# Patient Record
Sex: Male | Born: 1965
Health system: Southern US, Community
[De-identification: ages and names within clinical notes are randomized; demographics above are authoritative.]

## PROBLEM LIST (undated history)

## (undated) DIAGNOSIS — G43909 Migraine, unspecified, not intractable, without status migrainosus: Secondary | ICD-10-CM

## (undated) DIAGNOSIS — C801 Malignant (primary) neoplasm, unspecified: Secondary | ICD-10-CM

## (undated) DIAGNOSIS — K259 Gastric ulcer, unspecified as acute or chronic, without hemorrhage or perforation: Secondary | ICD-10-CM

## (undated) DIAGNOSIS — M545 Low back pain, unspecified: Secondary | ICD-10-CM

## (undated) DIAGNOSIS — F329 Major depressive disorder, single episode, unspecified: Secondary | ICD-10-CM

## (undated) DIAGNOSIS — I880 Nonspecific mesenteric lymphadenitis: Secondary | ICD-10-CM

## (undated) DIAGNOSIS — K219 Gastro-esophageal reflux disease without esophagitis: Secondary | ICD-10-CM

## (undated) DIAGNOSIS — I739 Peripheral vascular disease, unspecified: Secondary | ICD-10-CM

## (undated) DIAGNOSIS — IMO0002 Reserved for concepts with insufficient information to code with codable children: Secondary | ICD-10-CM

## (undated) DIAGNOSIS — B9681 Helicobacter pylori [H. pylori] as the cause of diseases classified elsewhere: Secondary | ICD-10-CM

## (undated) DIAGNOSIS — M549 Dorsalgia, unspecified: Secondary | ICD-10-CM

## (undated) DIAGNOSIS — M1990 Unspecified osteoarthritis, unspecified site: Secondary | ICD-10-CM

## (undated) DIAGNOSIS — G8929 Other chronic pain: Secondary | ICD-10-CM

## (undated) DIAGNOSIS — F32A Depression, unspecified: Secondary | ICD-10-CM

## (undated) DIAGNOSIS — I10 Essential (primary) hypertension: Secondary | ICD-10-CM

## (undated) DIAGNOSIS — K589 Irritable bowel syndrome without diarrhea: Secondary | ICD-10-CM

## (undated) DIAGNOSIS — I7090 Unspecified atherosclerosis: Secondary | ICD-10-CM

## (undated) DIAGNOSIS — J189 Pneumonia, unspecified organism: Secondary | ICD-10-CM

## (undated) DIAGNOSIS — E7800 Pure hypercholesterolemia, unspecified: Secondary | ICD-10-CM

## (undated) DIAGNOSIS — Z9289 Personal history of other medical treatment: Secondary | ICD-10-CM

## (undated) DIAGNOSIS — F419 Anxiety disorder, unspecified: Secondary | ICD-10-CM

## (undated) HISTORY — DX: Gastro-esophageal reflux disease without esophagitis: K21.9

## (undated) HISTORY — DX: Essential (primary) hypertension: I10

## (undated) HISTORY — PX: KNEE ARTHROSCOPY: SHX127

## (undated) HISTORY — DX: Reserved for concepts with insufficient information to code with codable children: IMO0002

## (undated) HISTORY — PX: BACK SURGERY: SHX140

---

## 1997-09-12 ENCOUNTER — Inpatient Hospital Stay (HOSPITAL_COMMUNITY): Admission: EM | Admit: 1997-09-12 | Discharge: 1997-09-13 | Payer: Self-pay | Admitting: Emergency Medicine

## 1997-09-14 ENCOUNTER — Emergency Department (HOSPITAL_COMMUNITY): Admission: EM | Admit: 1997-09-14 | Discharge: 1997-09-14 | Payer: Self-pay | Admitting: Emergency Medicine

## 1997-09-15 ENCOUNTER — Ambulatory Visit (HOSPITAL_COMMUNITY): Admission: RE | Admit: 1997-09-15 | Discharge: 1997-09-15 | Payer: Self-pay | Admitting: Cardiology

## 1997-10-22 ENCOUNTER — Ambulatory Visit (HOSPITAL_COMMUNITY): Admission: RE | Admit: 1997-10-22 | Discharge: 1997-10-22 | Payer: Self-pay | Admitting: Gastroenterology

## 1998-06-23 ENCOUNTER — Emergency Department (HOSPITAL_COMMUNITY): Admission: EM | Admit: 1998-06-23 | Discharge: 1998-06-23 | Payer: Self-pay | Admitting: Emergency Medicine

## 1998-06-23 ENCOUNTER — Encounter: Payer: Self-pay | Admitting: Emergency Medicine

## 2001-11-04 ENCOUNTER — Encounter: Payer: Self-pay | Admitting: Emergency Medicine

## 2001-11-04 ENCOUNTER — Emergency Department (HOSPITAL_COMMUNITY): Admission: EM | Admit: 2001-11-04 | Discharge: 2001-11-04 | Payer: Self-pay | Admitting: Plastic Surgery

## 2002-01-24 ENCOUNTER — Emergency Department (HOSPITAL_COMMUNITY): Admission: EM | Admit: 2002-01-24 | Discharge: 2002-01-24 | Payer: Self-pay | Admitting: Emergency Medicine

## 2002-07-03 ENCOUNTER — Emergency Department (HOSPITAL_COMMUNITY): Admission: EM | Admit: 2002-07-03 | Discharge: 2002-07-04 | Payer: Self-pay | Admitting: Emergency Medicine

## 2002-07-06 ENCOUNTER — Emergency Department (HOSPITAL_COMMUNITY): Admission: EM | Admit: 2002-07-06 | Discharge: 2002-07-06 | Payer: Self-pay | Admitting: Emergency Medicine

## 2002-07-23 ENCOUNTER — Encounter: Admission: RE | Admit: 2002-07-23 | Discharge: 2002-07-23 | Payer: Self-pay | Admitting: Family Medicine

## 2002-10-07 ENCOUNTER — Emergency Department (HOSPITAL_COMMUNITY): Admission: EM | Admit: 2002-10-07 | Discharge: 2002-10-07 | Payer: Self-pay | Admitting: Emergency Medicine

## 2002-10-07 ENCOUNTER — Encounter: Payer: Self-pay | Admitting: Emergency Medicine

## 2002-10-09 ENCOUNTER — Encounter: Admission: RE | Admit: 2002-10-09 | Discharge: 2002-10-09 | Payer: Self-pay | Admitting: Family Medicine

## 2002-10-13 ENCOUNTER — Encounter: Admission: RE | Admit: 2002-10-13 | Discharge: 2002-10-13 | Payer: Self-pay | Admitting: Family Medicine

## 2003-01-12 ENCOUNTER — Encounter: Admission: RE | Admit: 2003-01-12 | Discharge: 2003-01-12 | Payer: Self-pay | Admitting: Family Medicine

## 2003-10-22 ENCOUNTER — Encounter: Admission: RE | Admit: 2003-10-22 | Discharge: 2003-10-22 | Payer: Self-pay | Admitting: Family Medicine

## 2003-10-26 ENCOUNTER — Encounter: Admission: RE | Admit: 2003-10-26 | Discharge: 2003-10-26 | Payer: Self-pay | Admitting: Family Medicine

## 2003-10-26 ENCOUNTER — Inpatient Hospital Stay (HOSPITAL_COMMUNITY): Admission: AD | Admit: 2003-10-26 | Discharge: 2003-10-28 | Payer: Self-pay | Admitting: Family Medicine

## 2003-11-05 ENCOUNTER — Encounter: Admission: RE | Admit: 2003-11-05 | Discharge: 2003-11-05 | Payer: Self-pay | Admitting: Family Medicine

## 2005-02-14 ENCOUNTER — Emergency Department (HOSPITAL_COMMUNITY): Admission: EM | Admit: 2005-02-14 | Discharge: 2005-02-15 | Payer: Self-pay | Admitting: Emergency Medicine

## 2006-03-05 ENCOUNTER — Encounter: Payer: Self-pay | Admitting: Emergency Medicine

## 2006-03-05 ENCOUNTER — Inpatient Hospital Stay (HOSPITAL_COMMUNITY): Admission: EM | Admit: 2006-03-05 | Discharge: 2006-03-10 | Payer: Self-pay | Admitting: Family Medicine

## 2006-03-05 ENCOUNTER — Ambulatory Visit: Payer: Self-pay | Admitting: Family Medicine

## 2006-03-09 ENCOUNTER — Ambulatory Visit: Payer: Self-pay | Admitting: Gastroenterology

## 2006-03-14 ENCOUNTER — Ambulatory Visit: Payer: Self-pay | Admitting: Sports Medicine

## 2006-04-02 ENCOUNTER — Ambulatory Visit: Payer: Self-pay | Admitting: Gastroenterology

## 2006-04-19 ENCOUNTER — Ambulatory Visit: Payer: Self-pay | Admitting: Sports Medicine

## 2006-04-19 ENCOUNTER — Ambulatory Visit (HOSPITAL_COMMUNITY): Admission: RE | Admit: 2006-04-19 | Discharge: 2006-04-19 | Payer: Self-pay | Admitting: Family Medicine

## 2006-05-09 ENCOUNTER — Encounter (INDEPENDENT_AMBULATORY_CARE_PROVIDER_SITE_OTHER): Payer: Self-pay | Admitting: Specialist

## 2006-05-09 ENCOUNTER — Ambulatory Visit: Payer: Self-pay | Admitting: Gastroenterology

## 2006-05-18 ENCOUNTER — Ambulatory Visit: Payer: Self-pay | Admitting: Family Medicine

## 2006-06-12 DIAGNOSIS — B9681 Helicobacter pylori [H. pylori] as the cause of diseases classified elsewhere: Secondary | ICD-10-CM

## 2006-06-12 HISTORY — DX: Gastric ulcer, unspecified as acute or chronic, without hemorrhage or perforation: B96.81

## 2006-08-03 ENCOUNTER — Emergency Department (HOSPITAL_COMMUNITY): Admission: EM | Admit: 2006-08-03 | Discharge: 2006-08-03 | Payer: Self-pay | Admitting: Emergency Medicine

## 2007-05-16 ENCOUNTER — Inpatient Hospital Stay (HOSPITAL_COMMUNITY): Admission: EM | Admit: 2007-05-16 | Discharge: 2007-05-17 | Payer: Self-pay | Admitting: Emergency Medicine

## 2007-05-16 ENCOUNTER — Ambulatory Visit: Payer: Self-pay | Admitting: Internal Medicine

## 2007-05-21 ENCOUNTER — Ambulatory Visit: Payer: Self-pay | Admitting: Internal Medicine

## 2007-10-22 ENCOUNTER — Encounter (INDEPENDENT_AMBULATORY_CARE_PROVIDER_SITE_OTHER): Payer: Self-pay | Admitting: Family Medicine

## 2007-10-22 ENCOUNTER — Ambulatory Visit: Payer: Self-pay | Admitting: Family Medicine

## 2007-10-22 LAB — CONVERTED CEMR LAB
AST: 26 units/L (ref 0–37)
Alkaline Phosphatase: 117 units/L (ref 39–117)
BUN: 13 mg/dL (ref 6–23)
Basophils Absolute: 0 10*3/uL (ref 0.0–0.1)
Calcium: 9.8 mg/dL (ref 8.4–10.5)
Eosinophils Absolute: 0.6 10*3/uL (ref 0.0–0.7)
Eosinophils Relative: 7 % — ABNORMAL HIGH (ref 0–5)
Glucose, Bld: 81 mg/dL (ref 70–99)
HCT: 47.3 % (ref 39.0–52.0)
Hemoglobin: 16.3 g/dL (ref 13.0–17.0)
Lipase: <10 units/L (ref 0–75)
Lymphocytes Relative: 26 % (ref 12–46)
MCV: 91.7 fL (ref 78.0–100.0)
Monocytes Absolute: 0.6 10*3/uL (ref 0.1–1.0)
Neutro Abs: 4.5 10*3/uL (ref 1.7–7.7)
Neutrophils Relative %: 59 % (ref 43–77)
Platelets: 331 10*3/uL (ref 150–400)
Potassium: 4.7 meq/L (ref 3.5–5.3)
RDW: 12.7 % (ref 11.5–15.5)
Sodium: 142 meq/L (ref 135–145)
WBC: 7.6 10*3/uL (ref 4.0–10.5)

## 2007-10-24 ENCOUNTER — Ambulatory Visit: Payer: Self-pay | Admitting: Family Medicine

## 2007-10-24 DIAGNOSIS — K2990 Gastroduodenitis, unspecified, without bleeding: Secondary | ICD-10-CM

## 2007-10-24 DIAGNOSIS — K2970 Gastritis, unspecified, without bleeding: Secondary | ICD-10-CM | POA: Insufficient documentation

## 2007-10-24 LAB — CONVERTED CEMR LAB: H Pylori IgG: POSITIVE

## 2007-11-07 ENCOUNTER — Ambulatory Visit: Payer: Self-pay | Admitting: Family Medicine

## 2008-05-22 ENCOUNTER — Ambulatory Visit (HOSPITAL_COMMUNITY): Admission: RE | Admit: 2008-05-22 | Discharge: 2008-05-22 | Payer: Self-pay | Admitting: Orthopedic Surgery

## 2008-05-24 ENCOUNTER — Emergency Department (HOSPITAL_COMMUNITY): Admission: EM | Admit: 2008-05-24 | Discharge: 2008-05-24 | Payer: Self-pay | Admitting: Emergency Medicine

## 2008-06-15 ENCOUNTER — Ambulatory Visit (HOSPITAL_BASED_OUTPATIENT_CLINIC_OR_DEPARTMENT_OTHER): Admission: RE | Admit: 2008-06-15 | Discharge: 2008-06-15 | Payer: Self-pay | Admitting: Orthopedic Surgery

## 2008-09-01 ENCOUNTER — Telehealth: Payer: Self-pay | Admitting: Family Medicine

## 2008-09-01 ENCOUNTER — Ambulatory Visit: Payer: Self-pay | Admitting: Family Medicine

## 2008-09-01 DIAGNOSIS — I10 Essential (primary) hypertension: Secondary | ICD-10-CM | POA: Insufficient documentation

## 2008-09-01 DIAGNOSIS — F17200 Nicotine dependence, unspecified, uncomplicated: Secondary | ICD-10-CM | POA: Insufficient documentation

## 2008-09-01 DIAGNOSIS — R002 Palpitations: Secondary | ICD-10-CM | POA: Insufficient documentation

## 2008-09-01 DIAGNOSIS — G4700 Insomnia, unspecified: Secondary | ICD-10-CM | POA: Insufficient documentation

## 2008-09-02 ENCOUNTER — Ambulatory Visit: Payer: Self-pay | Admitting: Family Medicine

## 2008-09-03 ENCOUNTER — Ambulatory Visit: Payer: Self-pay | Admitting: Family Medicine

## 2008-09-04 ENCOUNTER — Telehealth (INDEPENDENT_AMBULATORY_CARE_PROVIDER_SITE_OTHER): Payer: Self-pay | Admitting: *Deleted

## 2008-09-04 ENCOUNTER — Encounter: Payer: Self-pay | Admitting: Family Medicine

## 2008-09-09 ENCOUNTER — Encounter (INDEPENDENT_AMBULATORY_CARE_PROVIDER_SITE_OTHER): Payer: Self-pay | Admitting: *Deleted

## 2008-10-16 ENCOUNTER — Telehealth: Payer: Self-pay | Admitting: Family Medicine

## 2008-10-16 ENCOUNTER — Ambulatory Visit: Payer: Self-pay | Admitting: Family Medicine

## 2009-05-11 ENCOUNTER — Ambulatory Visit: Payer: Self-pay | Admitting: Family Medicine

## 2009-05-11 DIAGNOSIS — R1013 Epigastric pain: Secondary | ICD-10-CM | POA: Insufficient documentation

## 2009-05-11 DIAGNOSIS — F418 Other specified anxiety disorders: Secondary | ICD-10-CM | POA: Insufficient documentation

## 2009-05-17 ENCOUNTER — Ambulatory Visit: Payer: Self-pay | Admitting: Gastroenterology

## 2009-06-12 DIAGNOSIS — I880 Nonspecific mesenteric lymphadenitis: Secondary | ICD-10-CM

## 2009-06-12 DIAGNOSIS — J189 Pneumonia, unspecified organism: Secondary | ICD-10-CM

## 2009-06-12 HISTORY — DX: Nonspecific mesenteric lymphadenitis: I88.0

## 2009-06-12 HISTORY — DX: Pneumonia, unspecified organism: J18.9

## 2009-07-14 ENCOUNTER — Encounter: Payer: Self-pay | Admitting: Family Medicine

## 2009-07-14 ENCOUNTER — Ambulatory Visit: Payer: Self-pay | Admitting: Family Medicine

## 2009-07-15 ENCOUNTER — Telehealth: Payer: Self-pay | Admitting: Family Medicine

## 2009-07-17 ENCOUNTER — Encounter: Payer: Self-pay | Admitting: Family Medicine

## 2009-07-17 ENCOUNTER — Encounter (INDEPENDENT_AMBULATORY_CARE_PROVIDER_SITE_OTHER): Payer: Self-pay | Admitting: *Deleted

## 2009-07-17 ENCOUNTER — Ambulatory Visit: Payer: Self-pay | Admitting: Family Medicine

## 2009-07-18 ENCOUNTER — Inpatient Hospital Stay (HOSPITAL_COMMUNITY): Admission: EM | Admit: 2009-07-18 | Discharge: 2009-07-20 | Payer: Self-pay | Admitting: Emergency Medicine

## 2009-07-26 ENCOUNTER — Encounter: Payer: Self-pay | Admitting: Family Medicine

## 2009-07-26 ENCOUNTER — Ambulatory Visit: Payer: Self-pay | Admitting: Family Medicine

## 2009-07-26 ENCOUNTER — Telehealth: Payer: Self-pay | Admitting: Family Medicine

## 2009-07-26 LAB — CONVERTED CEMR LAB
Ketones, urine, test strip: NEGATIVE
Specific Gravity, Urine: 1.015
Urobilinogen, UA: 0.2
pH: 6.5

## 2009-07-27 ENCOUNTER — Encounter (INDEPENDENT_AMBULATORY_CARE_PROVIDER_SITE_OTHER): Payer: Self-pay | Admitting: *Deleted

## 2009-07-27 LAB — CONVERTED CEMR LAB
BUN: 12 mg/dL (ref 6–23)
CO2: 26 meq/L (ref 19–32)
Calcium: 9.5 mg/dL (ref 8.4–10.5)
Chloride: 104 meq/L (ref 96–112)
Creatinine, Ser: 1.18 mg/dL (ref 0.40–1.50)
Eosinophils Relative: 3 % (ref 0–5)
HCT: 43 % (ref 39.0–52.0)
Hemoglobin: 14.1 g/dL (ref 13.0–17.0)
Lymphocytes Relative: 19 % (ref 12–46)
Lymphs Abs: 2 10*3/uL (ref 0.7–4.0)
Neutro Abs: 7.6 10*3/uL (ref 1.7–7.7)
Neutrophils Relative %: 71 % (ref 43–77)
Platelets: 542 10*3/uL — ABNORMAL HIGH (ref 150–400)
Potassium: 5.2 meq/L (ref 3.5–5.3)
RBC: 4.64 M/uL (ref 4.22–5.81)
RDW: 13.4 % (ref 11.5–15.5)
Sodium: 139 meq/L (ref 135–145)
Total Protein: 7 g/dL (ref 6.0–8.3)

## 2009-07-28 ENCOUNTER — Ambulatory Visit: Payer: Self-pay | Admitting: Family Medicine

## 2009-08-02 ENCOUNTER — Telehealth (INDEPENDENT_AMBULATORY_CARE_PROVIDER_SITE_OTHER): Payer: Self-pay | Admitting: Family Medicine

## 2009-08-02 ENCOUNTER — Encounter: Payer: Self-pay | Admitting: Family Medicine

## 2009-09-20 ENCOUNTER — Ambulatory Visit: Payer: Self-pay | Admitting: Gastroenterology

## 2009-10-18 ENCOUNTER — Encounter: Payer: Self-pay | Admitting: Family Medicine

## 2009-10-19 ENCOUNTER — Encounter: Payer: Self-pay | Admitting: Family Medicine

## 2010-03-14 ENCOUNTER — Encounter: Payer: Self-pay | Admitting: Family Medicine

## 2010-03-14 ENCOUNTER — Ambulatory Visit: Payer: Self-pay | Admitting: Family Medicine

## 2010-03-14 LAB — CONVERTED CEMR LAB
ALT: 67 units/L — ABNORMAL HIGH (ref 0–53)
AST: 27 units/L (ref 0–37)
Albumin: 3.8 g/dL (ref 3.5–5.2)
Basophils Absolute: 0 10*3/uL (ref 0.0–0.1)
Calcium: 9.3 mg/dL (ref 8.4–10.5)
Chloride: 106 meq/L (ref 96–112)
Eosinophils Absolute: 0.6 10*3/uL (ref 0.0–0.7)
Eosinophils Relative: 9 % — ABNORMAL HIGH (ref 0–5)
Glucose, Bld: 78 mg/dL (ref 70–99)
HCT: 44 % (ref 39.0–52.0)
Lipase: 23 units/L (ref 11–59)
Lymphs Abs: 2.2 10*3/uL (ref 0.7–4.0)
MCHC: 34.1 g/dL (ref 30.0–36.0)
MCV: 91.9 fL (ref 78.0–100.0)
Monocytes Absolute: 0.4 10*3/uL (ref 0.1–1.0)
Neutrophils Relative %: 48 % (ref 43–77)
Platelets: 255 10*3/uL (ref 150–400)
Potassium: 4.1 meq/L (ref 3.5–5.3)
RBC: 4.79 M/uL (ref 4.22–5.81)
Sodium: 140 meq/L (ref 135–145)

## 2010-03-15 ENCOUNTER — Encounter: Payer: Self-pay | Admitting: Family Medicine

## 2010-03-15 ENCOUNTER — Ambulatory Visit: Payer: Self-pay | Admitting: Family Medicine

## 2010-03-15 ENCOUNTER — Encounter: Admission: RE | Admit: 2010-03-15 | Discharge: 2010-03-15 | Payer: Self-pay | Admitting: Family Medicine

## 2010-03-21 ENCOUNTER — Ambulatory Visit: Payer: Self-pay | Admitting: Family Medicine

## 2010-03-21 ENCOUNTER — Encounter: Payer: Self-pay | Admitting: Family Medicine

## 2010-03-28 ENCOUNTER — Telehealth: Payer: Self-pay | Admitting: Family Medicine

## 2010-07-14 NOTE — Letter (Signed)
Summary: Out of Work  [facility]  [address]   [place], [place] [address]   Phone: [phone]  Fax: [phone]    July 26, 2009   Employee:  KAYO ZION    To Whom It May Concern:   For Medical reasons, please excuse the above named employee from work for the following dates:  Start:   July 26, 2009  End:   July 28, 2009  If you need additional information, please feel free to contact our office.         Sincerely,    Asher Muir MD

## 2010-07-14 NOTE — Letter (Signed)
Summary: Out of Work  [facility]  [address]   [place], [place] [address]   Phone: [phone]  Fax: [phone]    March 15, 2010   Employee:  MAURICE FOTHERINGHAM    To Whom It May Concern:   For Medical reasons, please excuse the above named employee from work for the following dates:  Start:   Monday 03/14/10  End:   Monday 03/21/10.  He has an appointment with physician on Monday 03/21/10.  If you need additional information, please feel free to contact our office.         Sincerely,    Cat Ta MD

## 2010-07-14 NOTE — Letter (Signed)
Summary: Out of Work  [facility]  [address]   [place], [place] [address]   Phone: [phone]  Fax: [phone]    July 26, 2009   Employee:  CHAYDEN GARRELTS    To Whom It May Concern:   For Medical reasons, please excuse the above named employee from work for the following dates:  Start:   July 26, 2009  End:   July 29, 2009  If you need additional information, please feel free to contact our office.         Sincerely,    Asher Muir MD

## 2010-07-14 NOTE — Miscellaneous (Signed)
Summary: pantoprazole not covered  Medications Added NEXIUM 40 MG CPDR (ESOMEPRAZOLE MAGNESIUM) 1 tab by mouth daily       Clinical Lists Changes rec'd message that this med is not on his insurance company's formulary. to md to see if she wants to change drugs or initiate a prior [place].Golden Circle RN  Oct 18, 2009 12:19 PM  I can change.  I show no primary insurance onhis registration.  What is his insurance?  Ardeen Garland  MD  Oct 18, 2009 1:31 PM  I cannot tell from the info shown.Golden Circle RN  Oct 18, 2009 1:49 PM  though insurance is not saved, he has a formulary saved when I go into his refills.  CHanged to Nexium, which does appear to be on his formulary.  Thanks! Ardeen Garland  MD  Oct 18, 2009 1:57 PM   Medications: Changed medication from PROTONIX 40 MG TBEC (PANTOPRAZOLE SODIUM) 1 tablet by mouth daily for heartburn to NEXIUM 40 MG CPDR (ESOMEPRAZOLE MAGNESIUM) 1 tab by mouth daily - Signed Rx of NEXIUM 40 MG CPDR (ESOMEPRAZOLE MAGNESIUM) 1 tab by mouth daily;  #30 x 6;  Signed;  Entered by: Ardeen Garland  MD;  Authorized by: Ardeen Garland  MD;  Method used: Electronically to [facility] [address] [phone]*, [address] [place], [place], [place]  [address], Ph: [phone] or [phone], Fax: [phone]    Prescriptions: NEXIUM 40 MG CPDR (ESOMEPRAZOLE MAGNESIUM) 1 tab by mouth daily  #30 x 6   Entered and Authorized by:   Ardeen Garland  MD   Signed by:   Ardeen Garland  MD on 10/18/2009   Method used:   Electronically to        [facility] [address] [phone]* (retail)       [address]       [place], [place]  [address]       Ph: [phone] or [phone]       Fax: [phone]   RxID:   [id number]

## 2010-07-14 NOTE — Letter (Signed)
Summary: Out of School  [facility]  [address]   [place], [place] [address]   Phone: [phone]  Fax: [phone]    March 14, 2010   Student:  Tony [name]    To Whom It May Concern:   For Medical reasons, please excuse the above named student from school for the following dates:  Start:   March 14, 2010  End:    Will be out of work on Monday 10/3 through 10/4.  Mr. Sigmon has another appointment with the doctor on 03/15/10.  If you need additional information, please feel free to contact our office.   Sincerely,    Cat Ta MD    ****This is a legal document and cannot be tampered with.  Schools are authorized to verify all information and to do so accordingly.

## 2010-07-14 NOTE — Progress Notes (Signed)
Summary: triage   Phone Note Call from Patient Call back at Home Phone [phone]   Caller: Patient Summary of Call: Has severe stomach pains and asking to be seen today. Initial call taken by: Clydell Hakim,  July 26, 2009 8:54 AM  Follow-up for Phone Call        pain above umbilicus most of the time. sometimes just below it.  last bm yesterday. has used suppositories when in hosp. d/c last Tuesday.was in for flu & PNA & "stomach virus" has been out of his PPI "for quite some time" filled it. he wants to be seen so he can go to work. states he cannot work in this much pain. has a hosp f/u next week. to be seen at 11am work in. aware of wait. told him to go pick up the ppi & take one Follow-up by: Golden Circle RN,  July 26, 2009 9:23 AM    Prescriptions: PROTONIX 40 MG TBEC (PANTOPRAZOLE SODIUM) 1 tablet by mouth daily for heartburn  #30 x 2   Entered by:   Golden Circle RN   Authorized by:   Ardeen Garland  MD   Signed by:   Golden Circle RN on 07/26/2009   Method used:   Electronically to        [facility]. [address] [phone]* (retail)       [address]       [place], [place]  [address]       Ph: [phone] or [phone]       Fax: [phone]   RxID:   [id number]

## 2010-07-14 NOTE — Initial Assessments (Signed)
Vital Signs:  Patient profile:   45 year old male O2 Sat:      98 % on Room air Temp:     98.7 degrees F Pulse rate:   89 / minute Resp:     22 per minute BP supine:   158 / 99  O2 Flow:  Room air  Primary Care Provider:  Ardeen Garland  MD   History of Present Illness: 45 yo male with recent office visit for flu like symptoms presented to ED with N/V x 4 days unable to take by mouth and still with flu-like symptoms but improving.  Pt though started to have more abdominal discomfort over the last two days with the n/v.  Vomitng is yellow in color no blood, unable to take anything by mouth.  Pt states he has had a fever as well and today as high as 100.2 at home (afebrile here).  Pt also states he has watery diarrhea x 1 day no blood no dark stool; and his urine is more dark than usual and he is making less.  Pt denies any sob but does state he has had a cough which has woresened.         Problems Prior to Update: 1)  Uri  (ICD-465.9) 2)  Depression  (ICD-311) 3)  Epigastric Pain, Chronic  (ICD-789.06) 4)  Viral Gastroenteritis  (ICD-008.8) 5)  Rhinitis  (ICD-477.9) 6)  Chest Discomfort  (ICD-786.59) 7)  Insomnia, Chronic  (ICD-307.42) 8)  Smoker  (ICD-305.1) 9)  Essential Hypertension  (ICD-401.9) 10)  Palpitations, Chronic  (ICD-785.1) 11)  Gastritis  (ICD-535.50)  Medications Prior to Update: 1)  Mobic 2)  Vicodin 3)  Protonix 40 Mg Tbec (Pantoprazole Sodium) .Marland Kitchen.. 1 Tablet By Mouth Daily For Heartburn 4)  Promethazine Hcl 25 Mg Tabs (Promethazine Hcl) .... One Q 6 Hours As Needed For Naueas 5)  Metoprolol Succinate 100 Mg Xr24h-Tab (Metoprolol Succinate) .Marland Kitchen.. 1 Tab By Mouth Daily For High Blood Pressure 6)  Amitriptyline Hcl 75 Mg Tabs (Amitriptyline Hcl) .Marland Kitchen.. 1 Tab By Mouth Qhs  Allergies (verified): No Known Drug Allergies  Past History:  Past Medical History: Last updated: 05/14/2009 Duodenitis h/o  heart murmur-Echos at [facility] Hemorrhoids internal Sleep  Apnea  Family History: Last updated: Aug 27, 2006 Father died at 36 with MI., Mother alive at 50 with seizures (? Med induced)  Social History: Last updated: 09/01/2008 Lives with wife and 2 children.  In addition, his wife's sister and four kids are currently living with them.  He works full-time at [employer].  He smokes 1 ppd and drinks 1-2 beers on the weekends.  No illicit drug abuse.  Review of Systems       see hpi  Physical Exam  General:  very tired and weak Eyes:  tearing buit no conjunctivitis PERRLA, EOMI Ears:  TM intact B/L Mouth:  dMM no erythema, no swelling Neck:  no LAD Lungs:  mild crackles bibasilar Heart:  RRR no murmur Abdomen:  BS + mild tender to palpation in mid epigastric region ND Pulses:  R and L carotid,radial,femoral,dorsalis pedis and posterior tibial pulses are full and equal bilaterally Extremities:  No clubbing, cyanosis, edema, or deformity noted with normal full range of motion of all joints.   Neurologic:  2-12 intact NVI Skin:  mild tenting LABS:  11.4>16/46.6<167      139/4.0/103/22/18/1.08<146   AST 57   ALT 89 UA: +ketones, +protein high spec gravity   Impression & Recommendations:  Problem # 1:  Nausea and Vomiting and dehydration Pt will be put on clear fluids and advance as tolerated.  Will Continue phenergen and zofran IV until tolerating by mouth. KUB showed possible illeus but pt has had watery stool today. and has bowel sounds.  Will give bolus of NS now and 2x MIVF for next four hours to help with fluid deficet.Will monitor.    Problem # 2:  flu with secondary bacterial PNA Seems to have PNA on abdominal CT scan.  Will treat for CAP with azithro and CTX.  Will get repeat CXR once not so dehydrated to see extent of PNA.  Will monitor for any increase wob.   Problem # 3:  ESSENTIAL HYPERTENSION (ICD-401.9)  His updated medication list for this problem includes:    Metoprolol Succinate 100 Mg Xr24h-tab (Metoprolol succinate) .Marland Kitchen... 1  tab by mouth daily for high blood pressure  Problem # 4:  GASTRITIS (ICD-535.50) will continue protonix.  Will also give Gi cocktail x 1.  Pt probably worsened due to increase stomach acid and no by mouth intake.  His updated medication list for this problem includes:    Protonix 40 Mg Tbec (Pantoprazole sodium) .Marland Kitchen... 1 tablet by mouth daily for heartburn  Problem # 5:  FEN/GI clears and advance as tolerated-  Will give 1 L bolus now then 250 cc for 4 hours then 18mL/hr.  Montior strict I/O's  Problem # 6:  Fever Afebrile now, will give tylenol as needed   Problem # 7:  ppx heparin and protonix  Problem # 8:  Dispo when taking good by mouth and more strength will d/c home with f/u  Complete Medication List: 1)  Mobic  2)  Vicodin  3)  Protonix 40 Mg Tbec (Pantoprazole sodium) .Marland Kitchen.. 1 tablet by mouth daily for heartburn 4)  Promethazine Hcl 25 Mg Tabs (Promethazine hcl) .... One q 6 hours as needed for naueas 5)  Metoprolol Succinate 100 Mg Xr24h-tab (Metoprolol succinate) .Marland Kitchen.. 1 tab by mouth daily for high blood pressure 6)  Amitriptyline Hcl 75 Mg Tabs (Amitriptyline hcl) .Marland Kitchen.. 1 tab by mouth qhs  Lewie Loron MD  July 17, 2009 11:03 PM   Appended Document:    R3 Admit Addendum For full summary, please see excellent R1 note.  In short, pt is a 45yo M with h/o chronic abd pain and HTN who presented to clinic on Wednesday with 2 d history of upper resp flu like symptoms.  Supportive therapy, no tamiflu, sent home.  Next day developed nausea/vomiting (NBNB) and diarrhea (watery, no red or mucous) with abd cramps, unable to keep food down.  States abd pain similar to chronic abd cramps, epigastric, no radiation.  Tried to stay at home but as continued complaint, came into ER today where his nausea was unable to be controlled.  FPTS called for admission.  Given 1L bolus as well as toradol for pain and zofran and phenergan.    For rest of history, please see R1 note. 98.7     89     22      158/99     98%RA GEN - sick appearing, tired, appropriate and responsive. HEENT - PERRL, EOMI, sl dry MM, no LAD, no sinus tenderness CVS - normal S1, S2, no m/r/g Pulm - CTAB, more coarse RML Abd - soft, TTP epigastrically and some RUQ, o/w nontender, nondistended, no HSM, no masses Ext - no c/c/e Skin - no rash Neuro - nonfocal  A/P: 45 yo male with  h/o previous abd pain, HTN, HLD presents with ILI and new n/v/d. 1. new RML PNA with ILI - outside of window for tamiflu, however if not improved will consider adding tomorrow.  Contine Azithro and CTX to cover post-influenza pneumonia.  Will hold off on adding Vancomycin to cover MRSA as main complaint is currently not respiratory issue.  If worsening fever, cough, breathing on CTX/azithro, add vanc to cover MRSA. 2. abd pain/n/v/d - abd CT showing some mesenteric adenitis but no obstruction.  will start on clears and slowly advance diet.  No previous abx exposure.  Low threshold to test for cdiff.  watery diarrhea.   3. HTN - continue metoprolol 4. FENGI - clears, 1 more L bolus then 250cc/o then 150cc/o.  UA showing dehydration.  ADAT. 5. ppx - heparin 6. Dispo - pending improvement with #1 and 2. 7. Depression - continue amitriptyline.

## 2010-07-14 NOTE — Progress Notes (Signed)
Summary: phn msg   Phone Note Call from Patient Call back at Home Phone [phone]   Caller: Patient Summary of Call: pt wants to be able to stay home again today and needs a note - still can't give a good stool sample - still constipated even with meds. Initial call taken by: De Nurse,  August 02, 2009 9:38 AM  Follow-up for Phone Call        will write work note.  Is he taking both miralax (2-3 times a day) and senna?  He can due a fleets enema or dulcolax suppository.  if  not effective, he should call back/make follow up appointment Follow-up by: Asher Muir MD,  August 02, 2009 12:13 PM  Additional Follow-up for Phone Call Additional follow up Details #1::        Pt notifed of above and voiced understandingt, will fax note to [phone] ATN Bethanne Ginger Additional Follow-up by: Gladstone Pih,  August 02, 2009 4:04 PM

## 2010-07-14 NOTE — Letter (Signed)
Summary: Out of Work  [facility]  [address]   [place], [place] [address]   Phone: [phone]  Fax: [phone]    August 02, 2009   Employee:  ELYAN VANWIEREN    To Whom It May Concern:   For Medical reasons, please excuse the above named employee from work for the following dates:  Start:   August 02, 2009  End:   August 02, 2009  If you need additional information, please feel free to contact our office.         Sincerely,    Asher Muir MD

## 2010-07-14 NOTE — Letter (Signed)
Summary: Out of Work  [facility]  [address]   [place], [place] [address]   Phone: [phone]  Fax: [phone]    March 21, 2010   Employee:  Tony [name]    To Whom It May Concern:   For Medical reasons, please excuse the above named employee from work for the following dates:  Start:    End:   Patient may return to work on Tuesday, March 22, 2010  If you need additional information, please feel free to contact our office.         Sincerely,    Cat Ta MD

## 2010-07-14 NOTE — Letter (Signed)
Summary: Out of Work  [facility]  [address]   [place], [place] [address]   Phone: [phone]  Fax: [phone]    July 14, 2009   Employee:  FILMORE MOLYNEUX    To Whom It May Concern:   For Medical reasons, please excuse the above named employee from work for the following dates:  Start: July 14, 2009  Back to work: July 16, 2009  If you need additional information, please feel free to contact our office.         Sincerely,    Bobby Rumpf  MD

## 2010-07-14 NOTE — Progress Notes (Signed)
Summary: Notes Needed   Phone Note Call from Patient Call back at Home Phone [phone]   Caller: spouse-Florise Summary of Call: Wants to know if a note for him being absent until Monday faxed to his work.  [phone].  Also needs one for her daughter Greg Cratty 08/12/96 faxed to her school at [phone].  needs to talk to nurse Initial call taken by: Clydell Hakim,  July 15, 2009 3:34 PM  Follow-up for Phone Call        states her husband is still sick. fever is still 100. vomited once. unable to go back to work in this condition. wants note child still sick as well. needs note told her I will call her when notes are ready. she got her flu shot 2 weeks ago & is staying away from them as much as possible. leaving on a vacation next friday urged her to make sure everyone gets their flu shots next year in October. she agreed Follow-up by: Golden Circle RN,  July 16, 2009 11:23 AM     Appended Document: Notes Needed Notes faxed per request.

## 2010-07-14 NOTE — Assessment & Plan Note (Signed)
Summary: f/u eo   Vital Signs:  Patient profile:   45 year old male Weight:      182.5 pounds Temp:     98.4 degrees F oral Pulse rate:   78 / minute Pulse rhythm:   regular BP sitting:   144 / 97  (right arm) Cuff size:   large  Vitals Entered By: Loralee Pacas CMA (July 28, 2009 8:40 AM)  Serial Vital Signs/Assessments:  Time      Position  BP       Pulse  Resp  Temp     By                     124/80                         Asher Muir MD   Primary Care Provider:  Ardeen Garland  MD  CC:  f/u epigastric pain, constipation, and bp.  History of Present Illness: 1.  epigastric pain--seen  2 days ago for severe epigastric pain.  had not been taking ppi.  did go fill script for ppi, taking two times a day.  feels some better, but not back to normal.  did vomit several times 2 nights ago and again yesterday morning, but none since yesterday morning.  eating a little.  still a little nauseated and taking phenergen ocassionally.  Upon further review of his records, he has had h pylori in the past and been treated (2007); an EGD by Dr. Russella Dar in 2007 showed gastritis and duodenitis, but no frank ulcers.  2.  constipation--has not had a normal bowel movement in about 2 weeks.  not eating much, however.  has had several hard small stools.  3.  bp--still elevated initially at 144/97, but manual recheck is 124/80.  on metop  Current Medications (verified): 1)  Protonix 40 Mg Tbec (Pantoprazole Sodium) .Marland Kitchen.. 1 Tablet By Mouth Daily For Heartburn 2)  Promethazine Hcl 12.5 Mg Tabs (Promethazine Hcl) .Marland Kitchen.. 1 Tab By Mouth Q 6 Hours As Needed Nausea 3)  Metoprolol Succinate 100 Mg Xr24h-Tab (Metoprolol Succinate) .Marland Kitchen.. 1 Tab By Mouth Daily For High Blood Pressure 4)  Amitriptyline Hcl 75 Mg Tabs (Amitriptyline Hcl) .Marland Kitchen.. 1 Tab By Mouth Qhs 5)  Ranitidine Hcl 150 Mg Caps (Ranitidine Hcl) .Marland Kitchen.. 1 Tab By Mouth Daily 6)  Tramadol Hcl 50 Mg Tabs (Tramadol Hcl) .Marland Kitchen.. 1 Tab By Mouth 6 Hours As Needed  Pain  Allergies: No Known Drug Allergies  Review of Systems General:  Denies fever. CV:  Denies chest pain or discomfort. GI:  Complains of abdominal pain, constipation, indigestion, nausea, and vomiting; denies bloody stools and dark tarry stools.  Physical Exam  General:  sitting up on exam table; appears to feel better than 2 days ago Abdomen:  mild ttp in epigastrium (with deep palpation).  mild ttp LLQ.  soft, normal bowel sounds, no distention, no masses, no guarding, no rigidity, and no rebound tenderness.   Additional Exam:  vital signs reviewed    Impression & Recommendations:  Problem # 1:  EPIGASTRIC PAIN, CHRONIC (ICD-789.06) Assessment Improved better with high dose ppi.  has GI follow up on 3/13 with Dr Russella Dar, who has seen him before.  will check h pylori stool antigen to see if reinfected (although ppi use reduces sensitivity of test).  pt to call back if not continuing to improve.    Orders: [facility]- Est  Level 4 ([address])Future Orders:  Miscellaneous Lab Charge-[facility] [phone]) ... 07/21/2010  Problem # 2:  CONSTIPATION (ICD-564.00) Assessment: New  probably caused, at least in part, by reduced by mouth intake.  Will give miralax and senna.    His updated medication list for this problem includes:    Miralax Powd (Polyethylene glycol 3350) .Marland Kitchen... 1 capful (17g) mixed in 8 ozs of fluid by mouth daily as needed constipation; dispense generic, 1 large bottle  Orders: [facility]- Est  Level 4 ([address])  Problem # 3:  ESSENTIAL HYPERTENSION (ICD-401.9) Assessment: Improved  at goal on manual recheck.   His updated medication list for this problem includes:    Metoprolol Succinate 100 Mg Xr24h-tab (Metoprolol succinate) .Marland Kitchen... 1 tab by mouth daily for high blood pressure  Orders: [facility]- Est  Level 4 ([address])  Complete Medication List: 1)  Protonix 40 Mg Tbec (Pantoprazole sodium) .Marland Kitchen.. 1 tablet by mouth daily for heartburn 2)  Promethazine Hcl 12.5 Mg Tabs (Promethazine hcl) .Marland Kitchen.. 1  tab by mouth q 6 hours as needed nausea 3)  Metoprolol Succinate 100 Mg Xr24h-tab (Metoprolol succinate) .Marland Kitchen.. 1 tab by mouth daily for high blood pressure 4)  Amitriptyline Hcl 75 Mg Tabs (Amitriptyline hcl) .Marland Kitchen.. 1 tab by mouth qhs 5)  Ranitidine Hcl 150 Mg Caps (Ranitidine hcl) .Marland Kitchen.. 1 tab by mouth daily 6)  Tramadol Hcl 50 Mg Tabs (Tramadol hcl) .Marland Kitchen.. 1 tab by mouth 6 hours as needed pain 7)  Miralax Powd (Polyethylene glycol 3350) .Marland Kitchen.. 1 capful (17g) mixed in 8 ozs of fluid by mouth daily as needed constipation; dispense generic, 1 large bottle  Patient Instructions: 1)  It was nice to see you today. 2)  Keep taking your protonix two times a day.   3)  You can stop the ranitidine. 4)  Try to eat small bland meals throughout the day. 5)  Complete the test kit as instructed by the lab. 6)  I will call you with the results of your test. 7)  For your constipation, try miralax two-three times a day until you have a good bowel movement.  For the next few days, you can senna (2 tablets daily). 8)  Please schedule a follow-up appointment in 6 months .  Prescriptions: MIRALAX  POWD (POLYETHYLENE GLYCOL 3350) 1 capful (17g) mixed in 8 ozs of fluid by mouth daily as needed constipation; dispense generic, 1 large bottle  #1 x 3   Entered and Authorized by:   Asher Muir MD   Signed by:   Asher Muir MD on 07/28/2009   Method used:   Electronically to        [facility] [address] [phone]* (retail)       [address]       [place], [place]  [address]       Ph: [phone] or [phone]       Fax: [phone]   RxID:   [id number]   Prevention & Chronic Care Immunizations   Influenza vaccine: Not documented    Tetanus booster: Not documented    Pneumococcal vaccine: Not documented  Other Screening   Smoking status: current  (07/26/2009)   Smoking cessation counseling: yes  (10/24/2007)  Lipids   Total Cholesterol: Not documented   LDL: Not  documented   LDL Direct: Not documented   HDL: Not documented   Triglycerides: Not documented  Hypertension   Last Blood Pressure: 144 / 97  (07/28/2009)   Serum creatinine: 1.18  (07/26/2009)  Serum potassium 5.2  (07/26/2009)    Hypertension flowsheet reviewed?: Yes   Progress toward BP goal: At goal   Hypertension comments: at goal on manual recheck  Self-Management Support :    Hypertension self-management support: Not documented

## 2010-07-14 NOTE — Assessment & Plan Note (Signed)
Summary: body aches,df   Vital Signs:  Patient profile:   45 year old male Weight:      184.1 pounds Temp:     98.2 degrees F oral Pulse rate:   60 / minute Pulse rhythm:   regular BP sitting:   161 / 98  (right arm) Cuff size:   regular  Vitals Entered By: Loralee Pacas CMA (March 14, 2010 9:28 AM)   Serial Vital Signs/Assessments:  Time      Position  BP       Pulse  Resp  Temp     By 12:06 PM            148/82   72                    Tonya Barkley CMA  CC: epigastric pain, nausea Is Patient Diabetic? No Pain Assessment Patient in pain? yes     Location: abdomen Intensity: 2 Type: sharp Onset of pain  Intermittent Comments pt states that he has been feeling like this since saturday. no fever, but has had chills,headache,fatigue,and watery diarrhea also c/o upper gastric pain. pt has had this problem in the past.   Primary Care Syreeta Figler:  . [facility]  CC:  epigastric pain and nausea.  History of Present Illness: 45 y/o  M with epigastric pain, nausea x 1 week.  Pt has history of chronic abdominal pain with a Dx of duodenitis.    Sharp pains in epigastric region started last week.  Worsened on Sat, he was in bed all day Sat and Sun.  Because of pain he had decreased appetite over the weekend.  He has been trying to drink ginger ale, but only drank 3 glassed over the weekend.  He tried pepto bismo, ex-lax.  He has been taking Ranitadine daily.  He has had this in the past (was scoped by Dr Russella Dar in the past) and was treated with high dose ppi.  History of chronic abd pain treated with ppi, but pt has been off of this for months because insurance would not cover it.  In March he was to have repeat EGD by Dr Russella Dar, but missed that appt.    He went to work this morning, but became nauseous and had watery diarrhea.  NO vomiting.  Now he is very much in pain.  The worse part is the feeling that we will vomit, but does not vomit.       Habits &  Providers  Alcohol-Tobacco-Diet     Tobacco Status: current     Tobacco Counseling: to quit use of tobacco products     Cigarette Packs/Day: 0.5  Current Medications (verified): 1)  Promethazine Hcl 12.5 Mg Tabs (Promethazine Hcl) .Marland Kitchen.. 1 Tab By Mouth Q 6 Hours As Needed Nausea 2)  Metoprolol Succinate 100 Mg Xr24h-Tab (Metoprolol Succinate) .Marland Kitchen.. 1 Tab By Mouth Daily For High Blood Pressure 3)  Amitriptyline Hcl 75 Mg Tabs (Amitriptyline Hcl) .Marland Kitchen.. 1 Tab By Mouth Qhs 4)  Ranitidine Hcl 150 Mg Caps (Ranitidine Hcl) .Marland Kitchen.. 1 Tab By Mouth Daily 5)  Tramadol Hcl 50 Mg Tabs (Tramadol Hcl) .Marland Kitchen.. 1 Tab By Mouth 6 Hours As Needed Pain 6)  Miralax  Powd (Polyethylene Glycol 3350) .Marland Kitchen.. 1 Capful (17g) Mixed in 8 Ozs of Fluid By Mouth Daily As Needed Constipation; Dispense Generic, 1 Large Bottle 7)  Zofran Odt 8 Mg Tbdp (Ondansetron) .Marland Kitchen.. 1 Tab On Tongue Every 8 Hrs As Needed Nausea/vomiting 8)  Protonix 40 Mg Tbec (Pantoprazole Sodium) .Marland Kitchen.. 1 Tab By Mouth Two Times A Day  Allergies (verified): No Known Drug Allergies  Past History:  Past Medical History: Last updated: 05/14/2009 Duodenitis h/o  heart murmur-Echos at [facility] Hemorrhoids internal Sleep Apnea  Past Surgical History: Last updated: 09/01/2008 CT - Abdominal - 11/05/2003 CT - Pelvic - 11/05/2003 EGD/colon - 05/29/2006 UGI Series - 11/05/2003 left knee meniscus surgery 03/2008 and again Jan 2010 (Dr. Turner Daniels  Family History: Last updated: 09/04/2006 Father died at 75 with MI., Mother alive at 58 with seizures (? Med induced)  Social History: Last updated: 09/01/2008 Lives with wife and 2 children.  In addition, his wife's sister and four kids are currently living with them.  He works full-time at [employer].  He smokes 1 ppd and drinks 1-2 beers on the weekends.  No illicit drug abuse.  Risk Factors: Smoking Status: current (03/14/2010) Packs/Day: 0.5 (03/14/2010)  Review of Systems General:  Complains of loss of appetite and malaise;  denies chills and fever. CV:  Denies chest pain or discomfort, fainting, fatigue, shortness of breath with exertion, and swelling of feet. Resp:  Denies chest discomfort, chest pain with inspiration, cough, shortness of breath, and wheezing. GI:  Complains of abdominal pain, diarrhea, loss of appetite, and nausea; denies bloody stools, constipation, excessive appetite, gas, vomiting, and vomiting blood. GU:  Denies dysuria, hematuria, and incontinence.  Physical Exam  General:  Well-developed,well-nourished,in no moderate distress; alert,appropriate and cooperative throughout examination.  Pt is lying on exam table, he is curled into a fetal position.   Mouth:  MMM Lungs:  Normal respiratory effort, chest expands symmetrically. Lungs are clear to auscultation, no crackles or wheezes. Heart:  Normal rate and regular rhythm. S1 and S2 normal without gallop, murmur, click, rub or other extra sounds. Abdomen:  soft, non-tender, no distention, no masses, no guarding, no rigidity, no rebound tenderness, no abdominal hernia, no inguinal hernia, no hepatomegaly, no splenomegaly, and bowel sounds hypoactive.   Extremities:  No clubbing, cyanosis, edema, or deformity noted with normal full range of motion of all joints.     Impression & Recommendations:  Problem # 1:  EPIGASTRIC PAIN, CHRONIC (ICD-789.06) Assessment New Abd exam does not feel like surgical abdomen.  Would like to get Abd series to look for air-fluid level.  Pt is not really vomiting, he is dry-heaving and only oral spit is coming out.  He tolerated by mouth water in the clinic.  Will check CBC, Cmet, Lipase.   Because he has had decreased by mouth intake x 3 days, we gave him bolus 1.5 L NS in clinic today.  Pt also received zofran, phenergan, toradol in the clinic.  Pt felt better after IVF.  He will go home today and restart protonix two times a day.  Will also get AXR.   He will rtc tomorrow for f/u.   His symptoms may be secondary  to flare of duodenitis.  When his pain is better controlled he should see Dr Russella Dar for EGD.    Sherron Monday to pharmacy regarding insurance coverage for PPI: his insurance does cover both omeprazole and protonix.***  Orders: Ketorolac-Toradol 15mg  (X9147) Promethazine up to 50mg  (J2550) Comp Met-[facility] (82956-21308) CBC w/Diff-[facility] (65784) Lipase-[facility] (69629-52841) Zofran 4mg  Tab [facility]) Radiology other (Radiology Other) [facility]- Est Level  3 (99213) 0.9% NS (EMRZERO) 0.9% NS (EMRZERO)  Complete Medication List: 1)  Promethazine Hcl 12.5 Mg Tabs (Promethazine hcl) .Marland Kitchen.. 1 tab by mouth q 6 hours as  needed nausea 2)  Metoprolol Succinate 100 Mg Xr24h-tab (Metoprolol succinate) .Marland Kitchen.. 1 tab by mouth daily for high blood pressure 3)  Amitriptyline Hcl 75 Mg Tabs (Amitriptyline hcl) .Marland Kitchen.. 1 tab by mouth qhs 4)  Ranitidine Hcl 150 Mg Caps (Ranitidine hcl) .Marland Kitchen.. 1 tab by mouth daily 5)  Tramadol Hcl 50 Mg Tabs (Tramadol hcl) .Marland Kitchen.. 1 tab by mouth 6 hours as needed pain 6)  Miralax Powd (Polyethylene glycol 3350) .Marland Kitchen.. 1 capful (17g) mixed in 8 ozs of fluid by mouth daily as needed constipation; dispense generic, 1 large bottle 7)  Zofran Odt 8 Mg Tbdp (Ondansetron) .Marland Kitchen.. 1 tab on tongue every 8 hrs as needed nausea/vomiting 8)  Protonix 40 Mg Tbec (Pantoprazole sodium) .Marland Kitchen.. 1 tab by mouth two times a day  Patient Instructions: 1)  Please schedule a follow-up appointment tomorrow with Dr Janalyn Harder.  2)  Phenergan: 25mg  every 8hrs as needed nausea 3)  Zofran on tongue every 8 hrs as needed for nausea 4)  Tramadol as needed pain 5)  Protonix 40mg  two times a day  Prescriptions: PROTONIX 40 MG TBEC (PANTOPRAZOLE SODIUM) 1 tab by mouth two times a day  #66 x 6   Entered and Authorized by:   Angeline Slim MD   Signed by:   Angeline Slim MD on 03/14/2010   Method used:   Electronically to        [facility]. [address] (retail)       [address]       [place], [place]  [address]       Ph: [phone]       Fax:  [phone]   RxID:   [id number] ZOFRAN ODT 8 MG TBDP (ONDANSETRON) 1 tab on tongue every 8 hrs as needed nausea/vomiting  #30 x 0   Entered and Authorized by:   Angeline Slim MD   Signed by:   Angeline Slim MD on 03/14/2010   Method used:   Electronically to        [facility]* (retail)       [address]       [place], [place]  [address]       Ph: [phone]       Fax: [phone]   RxID:   [id number] TRAMADOL HCL 50 MG TABS (TRAMADOL HCL) 1 tab by mouth 6 hours as needed pain  #30 x 0   Entered and Authorized by:   Angeline Slim MD   Signed by:   Angeline Slim MD on 03/14/2010   Method used:   Electronically to        [facility] [address] (retail)       [address]       [place], [place]  [address]       Ph: [phone]       Fax: [phone]   RxID:   [id number] PROMETHAZINE HCL 12.5 MG TABS (PROMETHAZINE HCL) 1 tab by mouth q 6 hours as needed nausea  #30 x 0   Entered and Authorized by:   Angeline Slim MD   Signed by:   Angeline Slim MD on 03/14/2010   Method used:   Electronically to        [facility]* (retail)       [address]       [place], [place]  [address]       Ph: [phone]       Fax: [phone]   RxID:  [id number] OMEPRAZOLE 40 MG CPDR (OMEPRAZOLE) 1 tab by mouth daily  #66 x 11   Entered and Authorized by:   Angeline Slim MD   Signed by:   Angeline Slim MD on 03/14/2010   Method used:   Electronically to        [facility]. [address]* (retail)       [address]       [place], [place]  [address]       Ph: [phone]       Fax: [phone]   RxID:   [id number] TRAMADOL HCL 50 MG TABS (TRAMADOL HCL) 1 tab by mouth 6 hours as needed pain  #30 x 0   Entered and Authorized by:   Angeline Slim MD   Signed by:   Angeline Slim MD on 03/14/2010   Method used:   Print then Give to Patient   RxID:   [id number] OMEPRAZOLE 40 MG CPDR (OMEPRAZOLE) 1 tab by mouth daily  #66 x 11   Entered and Authorized by:   Angeline Slim MD   Signed by:   Angeline Slim MD on 03/14/2010    Method used:   Print then Give to Patient   RxID:   [id number] PROMETHAZINE HCL 12.5 MG TABS (PROMETHAZINE HCL) 1 tab by mouth q 6 hours as needed nausea  #30 x 0   Entered and Authorized by:   Angeline Slim MD   Signed by:   Angeline Slim MD on 03/14/2010   Method used:   Print then Give to Patient   RxID:   [id number]    Medication Administration  Injection # 1:    Medication: Ketorolac-Toradol 15mg     Diagnosis: EPIGASTRIC PAIN, CHRONIC (ICD-789.06)    Route: IM    Site: L deltoid    Exp Date: 11/10/2011    Lot #: [id number]    Mfr: [email or web address]    Patient tolerated injection without complications    Given by: Loralee Pacas CMA (March 14, 2010 10:17 AM)  Injection # 2:    Medication: Promethazine up to 50mg     Diagnosis: EPIGASTRIC PAIN, CHRONIC (ICD-789.06)    Route: IM    Site: L deltoid    Exp Date: 06/12/2011    Lot #: [id number]    Mfr: [email or web address]    Patient tolerated injection without complications    Given by: Loralee Pacas CMA (March 14, 2010 10:18 AM)  Infusion # 1:    Diagnosis: EPIGASTRIC PAIN, CHRONIC (ICD-789.06)    Started: 11:25 AM    Stopped: 12:07 PM    Solution: 0.9% NS    Instructions: Infuse over 30 minutes    Route: IV infusion    Site: L antecubital fossa    Ordered by: Angeline Slim MD    Administered by: Theresia Lo RN-March 14, 2010 2:30 PM    Comments: IV started with # 22 gauge IV catheter at 11:23 AM    Patient tolerated infusion without complications  Infusion # 2:    Diagnosis: EPIGASTRIC PAIN, CHRONIC (ICD-789.06)    Started: 12:08  PM    Stopped: 2:05    Solution: 0.9% NS    Instructions:  IV bolus, infuse over 2 hours     Route: IV infusion    Site: R antecubital fossa    Ordered by: Angeline Slim MD    Administered by: Theresia Lo RN-March 14, 2010 2:33 PM    Comments:  IV catheter removed and is intact. pressure  dressing applied to site    Patient tolerated infusion without complications  Medication # 1:    Medication:  Zofran 4mg  Tab    Diagnosis: EPIGASTRIC PAIN, CHRONIC (ICD-789.06)    Dose: 8mg     Route: po    Exp Date: 11/10/2010    Lot #: [id number]    Mfr: teva    Patient tolerated medication without complications    Given by: Loralee Pacas CMA (March 14, 2010 11:09 AM)  Orders Added: 1)  Ketorolac-Toradol 15mg  [J1885] 2)  Promethazine up to 50mg  [J2550] 3)  Comp Met-[facility] [[address]40-98119] 4)  CBC w/Diff-[facility] [85025] 5)  Lipase-[facility] [83690-23215] 6)  Zofran 4mg  Tab [EMRORAL] 7)  Radiology other [Radiology Other] 8)  [facility]- Est Level  3 [99213] 9)  0.9% NS [EMRZERO] 10)  0.9% NS [EMRZERO]   Medication Administration  Injection # 1:    Medication: Ketorolac-Toradol 15mg     Diagnosis: EPIGASTRIC PAIN, CHRONIC (ICD-789.06)    Route: IM    Site: L deltoid    Exp Date: 11/10/2011    Lot #: [id number]    Mfr: HOSPIRA    Patient tolerated injection without complications    Given by: Loralee Pacas CMA (March 14, 2010 10:17 AM)  Injection # 2:    Medication: Promethazine up to 50mg     Diagnosis: EPIGASTRIC PAIN, CHRONIC (ICD-789.06)    Route: IM    Site: L deltoid    Exp Date: 06/12/2011    Lot #: [id number]    Mfr: [email or web address]    Patient tolerated injection without complications    Given by: Loralee Pacas CMA (March 14, 2010 10:18 AM)  Infusion # 1:    Diagnosis: EPIGASTRIC PAIN, CHRONIC (ICD-789.06)    Started: 11:25 AM    Stopped: 12:07 PM    Solution: 0.9% NS    Instructions: Infuse over 30 minutes    Route: IV infusion    Site: L antecubital fossa    Ordered by: Angeline Slim MD    Administered by: Theresia Lo RN-March 14, 2010 2:30 PM    Comments: IV started with # 22 gauge IV catheter at 11:23 AM    Patient tolerated infusion without complications  Infusion # 2:    Diagnosis: EPIGASTRIC PAIN, CHRONIC (ICD-789.06)    Started: 12:08  PM    Stopped: 2:05    Solution: 0.9% NS    Instructions:  IV bolus, infuse over 2 hours     Route: IV infusion    Site: R  antecubital fossa    Ordered by: Angeline Slim MD    Administered by: Theresia Lo RN-March 14, 2010 2:33 PM    Comments:  IV catheter removed and is intact. pressure dressing applied to site    Patient tolerated infusion without complications  Medication # 1:    Medication: Zofran 4mg  Tab    Diagnosis: EPIGASTRIC PAIN, CHRONIC (ICD-789.06)    Dose: 8mg     Route: po    Exp Date: 11/10/2010    Lot #: [id number]    Mfr: teva    Patient tolerated medication without complications    Given by: Loralee Pacas CMA (March 14, 2010 11:09 AM)  Orders Added: 1)  Ketorolac-Toradol 15mg  [J1885] 2)  Promethazine up to 50mg  [J2550] 3)  Comp Met-[facility] [96295-28413] 4)  CBC w/Diff-[facility] [85025] 5)  Lipase-[facility] [83690-23215] 6)  Zofran 4mg  Tab [EMRORAL] 7)  Radiology other [Radiology Other] 8)  [facility]- Est Level  3 [24401] 9)  0.9% NS [EMRZERO] 10)  0.9% NS [EMRZERO]

## 2010-07-14 NOTE — Letter (Signed)
Summary: Out of Work  [facility]  [address]   [place], [place] [address]   Phone: [phone]  Fax: [phone]    July 17, 2009   Employee:  Tony [name]    To Whom It May Concern:   For Medical reasons, please excuse the above named employee from work for the following dates:  Start:   07/16/09  Back to work: 07/19/09    If you need additional information, please feel free to contact our office.         Sincerely,    Bobby Rumpf  MD  Appended Document: Out of Work faxed.

## 2010-07-14 NOTE — Miscellaneous (Signed)
Summary: Change to Omeprazole  Medications Added OMEPRAZOLE 40 MG CPDR (OMEPRAZOLE) 1 tab by mouth daily       Clinical Lists Changes  Medications: Changed medication from NEXIUM 40 MG CPDR (ESOMEPRAZOLE MAGNESIUM) 1 tab by mouth daily to OMEPRAZOLE 40 MG CPDR (OMEPRAZOLE) 1 tab by mouth daily - Signed Rx of OMEPRAZOLE 40 MG CPDR (OMEPRAZOLE) 1 tab by mouth daily;  #33 x 11;  Signed;  Entered by: Ardeen Garland  MD;  Authorized by: Ardeen Garland  MD;  Method used: Electronically to [facility] [address] [phone]*, [address], [place], [place], [place]  [address], Ph: [phone] or [phone], Fax: [phone]    Prescriptions: OMEPRAZOLE 40 MG CPDR (OMEPRAZOLE) 1 tab by mouth daily  #33 x 11   Entered and Authorized by:   Ardeen Garland  MD   Signed by:   Ardeen Garland  MD on 10/19/2009   Method used:   Electronically to        [facility] [address] [phone]* (retail)       [address]       [place], [place]  [address]       Ph: [phone] or [phone]       Fax: [phone]   RxID:   [id number]

## 2010-07-14 NOTE — Assessment & Plan Note (Signed)
Summary: f/u per dr ta/eo   Vital Signs:  Patient profile:   45 year old male Height:      72 inches Weight:      185.06 pounds BMI:     25.19 BSA:     2.06 Temp:     98.2 degrees F Pulse rate:   72 / minute BP sitting:   162 / 101  Vitals Entered By: Jone Baseman CMA (March 21, 2010 8:46 AM) CC: f/u abd pain, nausea Is Patient Diabetic? No Pain Assessment Patient in pain? no        Primary Care Provider:  Cat Ta MD  CC:  f/u abd pain and nausea.  History of Present Illness: 45 y/o M here for f/u of  Epigastric/Abd pain, Nausea: Likely flare of duodenitis.  He has b een taking protonix 40mg  two times a day.  He started feeling better mid week last week.  Decreased nausea, abd pain.  BM back to normal (no diarrhea, no constipation, no melena).  Appetite back to normal.  He did have one set back on Sat; after eating eggs and sausage he developed abd pain.  No nausea this time.  Pain subsided shortly after this.  He is not sure if this has happened before.  States that he has abd pain so often that he is not sure if it is related to fatty foods.  He still has gallbladder.     HYPERTENSION Disease Monitoring Blood pressure range: 160s Medications: Metoprolol 100xl daily Compliance: have not been taking BP meds due to feeling poorly, resumed taking meds on Sat, but missed this morning's dose Lightheadedness: no  Edema: no Chest pain: no Dyspnea:no  Palpitaions: no Prevention Exercise: no  Salt restriction:trying +Tobacco smoke    Habits & Providers  Alcohol-Tobacco-Diet     Tobacco Status: current     Tobacco Counseling: to quit use of tobacco products     Cigarette Packs/Day: 1.0  Current Medications (verified): 1)  Promethazine Hcl 12.5 Mg Tabs (Promethazine Hcl) .Marland Kitchen.. 1 Tab By Mouth Q 6 Hours As Needed Nausea 2)  Metoprolol Succinate 100 Mg Xr24h-Tab (Metoprolol Succinate) .Marland Kitchen.. 1 Tab By Mouth Daily For High Blood Pressure 3)  Amitriptyline Hcl 75 Mg Tabs  (Amitriptyline Hcl) .Marland Kitchen.. 1 Tab By Mouth Qhs 4)  Tramadol Hcl 50 Mg Tabs (Tramadol Hcl) .Marland Kitchen.. 1 Tab By Mouth 6 Hours As Needed Pain 5)  Miralax  Powd (Polyethylene Glycol 3350) .Marland Kitchen.. 1 Capful (17g) Mixed in 8 Ozs of Fluid By Mouth Daily As Needed Constipation; Dispense Generic, 1 Large Bottle 6)  Zofran Odt 8 Mg Tbdp (Ondansetron) .Marland Kitchen.. 1 Tab On Tongue Every 8 Hrs As Needed Nausea/vomiting 7)  Protonix 40 Mg Tbec (Pantoprazole Sodium) .Marland Kitchen.. 1 Tab By Mouth Two Times A Day  Allergies (verified): No Known Drug Allergies  Past History:  Past Medical History: Last updated: 05/14/2009 Duodenitis h/o  heart murmur-Echos at [facility] Hemorrhoids internal Sleep Apnea  Past Surgical History: Last updated: 09/01/2008 CT - Abdominal - 11/05/2003 CT - Pelvic - 11/05/2003 EGD/colon - 05/29/2006 UGI Series - 11/05/2003 left knee meniscus surgery 03/2008 and again Jan 2010 (Dr. Turner Daniels  Family History: Last updated: 08/13/2006 Father died at 60 with MI., Mother alive at 34 with seizures (? Med induced)  Social History: Last updated: 09/01/2008 Lives with wife and 2 children.  In addition, his wife's sister and four kids are currently living with them.  He works full-time at [employer].  He smokes 1 ppd and drinks  1-2 beers on the weekends.  No illicit drug abuse.  Risk Factors: Smoking Status: current (03/21/2010) Packs/Day: 1.0 (03/21/2010)  Social History: Packs/Day:  1.0  Review of Systems       per hpi   Physical Exam  General:  Well-developed,well-nourished,in no acute distress; alert,appropriate and cooperative throughout examination. vitals reviewed.  Lungs:  Normal respiratory effort, chest expands symmetrically. Lungs are clear to auscultation, no crackles or wheezes. Heart:  Normal rate and regular rhythm. S1 and S2 normal without gallop, murmur, click, rub or other extra sounds. no bruit. Abdomen:  Bowel sounds positive,abdomen soft and non-tender without masses, organomegaly or hernias  noted. Pulses:  R radial normal, R dorsalis pedis normal, L radial normal, and L dorsalis pedis normal.   Extremities:  No clubbing, cyanosis, edema, or deformity noted with normal full range of motion of all joints.   Neurologic:  alert & oriented X3.     Impression & Recommendations:  Problem # 1:  EPIGASTRIC PAIN, CHRONIC (ICD-789.06) Assessment Improved Epigastric pain/nausea/duodenitis/gastritis improved since last week.  Pt taking protonix 40mg  two times a day, which was recommended in past by Dr Russella Dar.  He needs to see Dr Russella Dar for repeat EGD. He missed appt in 08/2009. ?pain related to food?   Pt still smoking, which may be irritant--> causing more discomfort.  Advised cessation   Orders: [facility]- Est  Level 4 ([address])  Problem # 2:  ESSENTIAL HYPERTENSION (ICD-401.9) Assessment: Deteriorated Bps elevated and not at goal of 130/80.  Looking at previous notes, pt has missed f/u appointments, making it difficult to adjust meds and monitor BPs.  He has been on Metoprolol xr 100mg  for >1 yr, but BP is poorly controlled.  There was report of him not tolerating one BP med to to ?spasm.  Pt does not recall this med.  Pt to rtc to see Nurse for BP check, then to see me in 2-3 wks.  If BP elevated, will consider adding Lisinopril or HCTZ.    His updated medication list for this problem includes:    Metoprolol Succinate 100 Mg Xr24h-tab (Metoprolol succinate) .Marland Kitchen... 1 tab by mouth daily for high blood pressure  Orders: [facility]- Est  Level 4 (99214)  Problem # 3:  SMOKER (ICD-305.1) Assessment: Comment Only Advised cessation.   Complete Medication List: 1)  Promethazine Hcl 12.5 Mg Tabs (Promethazine hcl) .Marland Kitchen.. 1 tab by mouth q 6 hours as needed nausea 2)  Metoprolol Succinate 100 Mg Xr24h-tab (Metoprolol succinate) .Marland Kitchen.. 1 tab by mouth daily for high blood pressure 3)  Amitriptyline Hcl 75 Mg Tabs (Amitriptyline hcl) .Marland Kitchen.. 1 tab by mouth qhs 4)  Tramadol Hcl 50 Mg Tabs (Tramadol hcl) .Marland Kitchen.. 1 tab by  mouth 6 hours as needed pain 5)  Miralax Powd (Polyethylene glycol 3350) .Marland Kitchen.. 1 capful (17g) mixed in 8 ozs of fluid by mouth daily as needed constipation; dispense generic, 1 large bottle 6)  Zofran Odt 8 Mg Tbdp (Ondansetron) .Marland Kitchen.. 1 tab on tongue every 8 hrs as needed nausea/vomiting 7)  Protonix 40 Mg Tbec (Pantoprazole sodium) .Marland Kitchen.. 1 tab by mouth two times a day  Patient Instructions: 1)  Please schedule a follow-up appointment in 2-3 weeks for blood pressure. 2)  Make appointment for Nurse visit to check Blood presure, within next week or two. 3)  Call Dr Ardell Isaacs office to schedule EGD. 4)  Tobacco is very bad for your health and your loved ones ! You should stop smoking !  5)  Stop smoking  tips: Choose a quit date. Cut down before the quit date. Decide what you will do as a substitute when you feel the urge to smoke(gum, toothpick, exercise).

## 2010-07-14 NOTE — Assessment & Plan Note (Signed)
Summary: fever/[place]/mayans   Vital Signs:  Patient profile:   45 year old male Weight:      192.5 pounds Temp:     100.6 degrees F oral Pulse rate:   113 / minute Pulse rhythm:   regular BP sitting:   163 / 100  (right arm) Cuff size:   large  Vitals Entered By: Loralee Pacas CMA (July 14, 2009 10:14 AM) Comments fever, cough, and congestion x 1 day    Primary Care Provider:  Ardeen Garland  MD   History of Present Illness: 1) Cough, fever: Cough w/ clear sputum, fever, myalgias, nausea w/ emesis (non bilious) x 1, decreased appetite, loose stools, clear rhinorrhea, headache x 2 days. +sick contact = daughter. Denies weight loss, rash, sore throat, lymphadenopathy, breathing difficulty, chest pain. Did not get flu vaccine this season.   Current Medications (verified): 1)  Mobic 2)  Vicodin 3)  Protonix 40 Mg Tbec (Pantoprazole Sodium) .Marland Kitchen.. 1 Tablet By Mouth Daily For Heartburn 4)  Promethazine Hcl 25 Mg Tabs (Promethazine Hcl) .... One Q 6 Hours As Needed For [place] 5)  Metoprolol Succinate 100 Mg Xr24h-Tab (Metoprolol Succinate) .Marland Kitchen.. 1 Tab By Mouth Daily For High Blood Pressure 6)  Amitriptyline Hcl 75 Mg Tabs (Amitriptyline Hcl) .Marland Kitchen.. 1 Tab By Mouth Qhs  Allergies (verified): No Known Drug Allergies  Physical Exam  General:  appears not feeling well coughing NAD  hypertensive and tachycardic but coughing a lot while vitals were taken  Head:  no sinus pain  Eyes:  tearing buit no conjunctivitis  Ears:  TMs clear  Nose:  rhinorrhea  Mouth:  mild erythema w/o exudate, moist membranes, copious posterior pharyngeal mucus  Neck:  no lymphadenopathy   Lungs:  CTAB w/o wheeze or crackles  Abdomen:  s/nt/nd +BS  Pulses:  2+ radials  Skin:  no rash, good cap refill   Impression & Recommendations:  Problem # 1:  URI (ICD-465.9) Assessment New  Likely viral - possibly influenza given symptomatology. Antivirals not indicated given duration of symptoms. Abx not indicated.  Symptomatic treatment keeping comorbidities in mind recommended. Follow up as needed. No red flags. Red flags reviewed.   Orders: [facility]- Est Level  3 (99213)  Complete Medication List: 1)  Mobic  2)  Vicodin  3)  Protonix 40 Mg Tbec (Pantoprazole sodium) .Marland Kitchen.. 1 tablet by mouth daily for heartburn 4)  Promethazine Hcl 25 Mg Tabs (Promethazine hcl) .... One q 6 hours as needed for naueas 5)  Metoprolol Succinate 100 Mg Xr24h-tab (Metoprolol succinate) .Marland Kitchen.. 1 tab by mouth daily for high blood pressure 6)  Amitriptyline Hcl 75 Mg Tabs (Amitriptyline hcl) .Marland Kitchen.. 1 tab by mouth qhs  Patient Instructions: 1)  Drink lots of fluids.  2)  Get rest. 3)  Take over the counter cough medication (make sure that the cough medication does not have Sudafed or pseudoephedrine, so that it is safe to use with high blood pressure) 4)  If the over the counter cough medicine has acetaminopen or Tylenol in it, do not give additional Tylenol as these are the same thing, otherwise can give Tylenol for fevers.  5)  Avoid Ibuprofen or Aspirin for fever with history of ulcers.  6)  Follow up as needed if n not improving in 1 week.

## 2010-07-14 NOTE — Assessment & Plan Note (Signed)
Summary: stomach pains/[place]/mayans   Vital Signs:  Patient profile:   45 year old male Height:      72 inches Weight:      183 pounds BMI:     24.91 Temp:     98.5 degrees F oral Pulse rate:   82 / minute BP sitting:   170 / 104  (right arm) Cuff size:   regular  Vitals Entered By: Tessie Fass CMA (July 26, 2009 11:06 AM)  CC: abdominal pain, RLQ and epigastric and nausea Is Patient Diabetic? No Pain Assessment Patient in pain? yes     Location: abdomen   Primary Care Provider:  Ardeen Garland  MD  CC:  abdominal pain and RLQ and epigastric and nausea.  History of Present Illness: Pt with hx of chronic abominal pain recently hospitalized for abdominal pain, dehydration and pneumonia comes for work in for:  1.  abdominal pain--in the hospital 2/5-2/8th for dehydration, n/v/d, pneumonia.  since discharge, abdominal pain better at the time of discharge, but has gradually been getting worse.  having  burning epigastric pain that sometimes radiates to RLQ.  better if he is on his side on fetal position.  worse if he stand up or lays on back.  worse with movement.  nauseated, but no vomitting.  little food intake past 2 days.  significantly decreased uop.  can't remember if he has urinated in the past 24 hours.  last last bm one week ago.  ran out of protonix some time ago.  has refill called in, but has not yet picked it up from the pharmacy.    Of note, CT abd/pelvis was performed during his hospitalization, which showed:  1.  Ill-defined airspace disease of the lingula and right middle   lobe.  While this could represent atelectasis, and raises concern   for a typical infection or early bronchopneumonia.   2.  Small mesenteric lymph nodes are stable, potentially   representing mesenteric adenitis.   3.  Atherosclerosis.   4.  Mild sigmoid diverticulosis without evidence for   diverticulitis.   5.  Stable left adrenal adenoma.  Also, of note, he was referred to GI for his  abdominal pain in December.  but he did not show up to the appointment because he did not have the money for the copay  2.  hypertension--bp quite elevated today.  he has not yet taken his metoprolol.    contact [phone] (OK to leave mssg)  Habits & Providers  Alcohol-Tobacco-Diet     Tobacco Status: current     Cigarette Packs/Day: 0.5  Current Medications (verified): 1)  Mobic 2)  Vicodin 3)  Protonix 40 Mg Tbec (Pantoprazole Sodium) .Marland Kitchen.. 1 Tablet By Mouth Daily For Heartburn 4)  Promethazine Hcl 12.5 Mg Tabs (Promethazine Hcl) .Marland Kitchen.. 1 Tab By Mouth Q 6 Hours As Needed Nausea 5)  Metoprolol Succinate 100 Mg Xr24h-Tab (Metoprolol Succinate) .Marland Kitchen.. 1 Tab By Mouth Daily For High Blood Pressure 6)  Amitriptyline Hcl 75 Mg Tabs (Amitriptyline Hcl) .Marland Kitchen.. 1 Tab By Mouth Qhs 7)  Ranitidine Hcl 150 Mg Caps (Ranitidine Hcl) .Marland Kitchen.. 1 Tab By Mouth Daily 8)  Doxycycline Hyclate 100 Mg Tabs (Doxycycline Hyclate) .Marland Kitchen.. 1 Tab By Mouth Bid  For Infection 9)  Tramadol Hcl 50 Mg Tabs (Tramadol Hcl) .Marland Kitchen.. 1 Tab By Mouth 6 Hours As Needed Pain  Allergies: No Known Drug Allergies  Past History:  Past Medical History: Reviewed history from 05/14/2009 and no changes required. Duodenitis h/o  heart murmur-Echos at [facility] Hemorrhoids internal Sleep Apnea  Social History: Packs/Day:  0.5  Review of Systems General:  Complains of loss of appetite, malaise, and weight loss; denies fever. CV:  Denies chest pain or discomfort. GI:  Complains of abdominal pain, constipation, loss of appetite, and nausea; denies bloody stools, dark tarry stools, diarrhea, vomiting, vomiting blood, and yellowish skin color.  Physical Exam  General:  laying on exam table in fetal position Mouth:  slightly dry mucous membranes Lungs:  Normal respiratory effort, chest expands symmetrically. Lungs are clear to auscultation, no crackles or wheezes. Heart:  RRR no murmur Abdomen:  mild-moderate ttp in epigastrium (with deep  palpation).  mild ttp right middle abdomen.  soft, normal bowel sounds, no distention, no masses, no guarding, no rigidity, and no rebound tenderness.   Extremities:  no significant LE edema Skin:  turgor normal.   Additional Exam:  vital signs reviewed    Impression & Recommendations:  Problem # 1:  EPIGASTRIC PAIN, CHRONIC (ICD-789.06) Assessment Deteriorated think this is worsening of his chronic abominal pain.  by exam, he does not have an acute abdomen.  he does not appear clinically dehydrated on exam and his spec gravity is normal on u/a. no signs of infection on u/a.   Precepted with Dr. Deirdre Priest.  Plan:  check CMET, lipase, CBC.  strict instructions for oral hydration given to pt.  continue tramadol for pain.  increase protonix to two times a day.  follow up in clinic on Thursday.    In the long run, he needs GI consult and likely EGD.  He tells me today that he would be able to go and pay the co-pay if we make the referral.    Orders: Urinalysis-[facility] (00000) Comp Met-[facility] [phone]) Lipase-[facility] (35573-22025) CBC w/Diff-[facility] (42706) [facility]- Est  Level 4 (23762) Gastroenterology Referral (GI)  Problem # 2:  ESSENTIAL HYPERTENSION (ICD-401.9) Assessment: Deteriorated  advised to take his metoprolol as soon as he gets home  ated medication list for this problem includes:    Metoprolol Succinate 100 Mg Xr24h-tab (Metoprolol succinate) .Marland Kitchen... 1 tab by mouth daily for high blood pressure  Orders: [facility]- Est  Level 4 (99214)  Complete Medication List: 1)  Mobic  2)  Vicodin  3)  Protonix 40 Mg Tbec (Pantoprazole sodium) .Marland Kitchen.. 1 tablet by mouth daily for heartburn 4)  Promethazine Hcl 12.5 Mg Tabs (Promethazine hcl) .Marland Kitchen.. 1 tab by mouth q 6 hours as needed nausea 5)  Metoprolol Succinate 100 Mg Xr24h-tab (Metoprolol succinate) .Marland Kitchen.. 1 tab by mouth daily for high blood pressure 6)  Amitriptyline Hcl 75 Mg Tabs (Amitriptyline hcl) .Marland Kitchen.. 1 tab by mouth qhs 7)  Ranitidine Hcl 150 Mg Caps  (Ranitidine hcl) .Marland Kitchen.. 1 tab by mouth daily 8)  Doxycycline Hyclate 100 Mg Tabs (Doxycycline hyclate) .Marland Kitchen.. 1 tab by mouth bid  for infection 9)  Tramadol Hcl 50 Mg Tabs (Tramadol hcl) .Marland Kitchen.. 1 tab by mouth 6 hours as needed pain  Patient Instructions: 1)  It was nice to see you today. 2)  Lots of fluids:  at least 1/2 cup every hour.   3)  For the pain, pick up your protonix.  Take one tablet two times a day.   You can keep taking tramadol. 4)  No motrin, ibuprofen, or mobic. 5)  We will help set you up with the GI doctor (belly doctor).  Our nurses will call you with an appointment. 6)  Please schedule a follow-up appointment on Wednesday, preferably  with Lafonda Mosses.     Orders Added: 1)  Urinalysis-[facility] [00000] 2)  Comp Met-[facility] [40981-19147] 3)  Lipase-[facility] [83690-23215] 4)  CBC w/Diff-[facility] [85025] 5)  [facility]- Est  Level 4 [82956] 6)  Gastroenterology Referral [GI]   Laboratory Results   Urine Tests  Date/Time Received: July 26, 2009 11:59 AM  Date/Time Reported: July 26, 2009 12:17 PM   Routine Urinalysis   Color: yellow Appearance: Clear Glucose: negative   (Normal Range: Negative) Bilirubin: negative   (Normal Range: Negative) Ketone: negative   (Normal Range: Negative) Spec. Gravity: 1.015   (Normal Range: 1.003-1.035) Blood: negative   (Normal Range: Negative) pH: 6.5   (Normal Range: 5.0-8.0) Protein: 30   (Normal Range: Negative) Urobilinogen: 0.2   (Normal Range: 0-1) Nitrite: negative   (Normal Range: Negative) Leukocyte Esterace: negative   (Normal Range: Negative)  Urine Microscopic WBC/HPF: 0-3 Bacteria/HPF: 1+ Mucous/HPF: 1+ Epithelial/HPF: rare    Comments: rare sperm present...........test performed by...........Marland KitchenTerese Door, CMA

## 2010-07-14 NOTE — Assessment & Plan Note (Signed)
Summary: f/u Mon visit/eo   Vital Signs:  Patient profile:   45 year old male Height:      72 inches Weight:      183 pounds BMI:     24.91 Temp:     97.8 degrees F oral Pulse rate:   61 / minute BP sitting:   148 / 89  (left arm) Cuff size:   regular  Vitals Entered By: Jimmy Footman, CMA (March 15, 2010 9:29 AM) CC: F/U from yesterdays visit Is Patient Diabetic? No   Primary Care [place] Deterding:  . RED TE[facility]  CC:  F/U from yesterdays visit.  History of Present Illness: 45 y/o M here for f/u of nausea, abd pain.  Pt was seen by me in clinic yesterday.  It seems he may be having flare up of duodenitis 2/2 to being out of ppi x months.  Labs from yesterday; cbc, cmet, lipase were all wnl.  Abx from this morning did not show air-fluid level, no sbo, some mild constipation.  Pt is feeling a little better today.  He has been able to take by mouth, but does feel nauseous after eating.  He has been taking Zofran odt, which helps.  He restarted on protonic 40mg  two times a day yesterday.  He is still having some abd pain, which tramadol helps.  He is still smoking.   Habits & Providers  Alcohol-Tobacco-Diet     Tobacco Status: current     Tobacco Counseling: to quit use of tobacco products     Cigarette Packs/Day: 0.5  Current Medications (verified): 1)  Promethazine Hcl 12.5 Mg Tabs (Promethazine Hcl) .Marland Kitchen.. 1 Tab By Mouth Q 6 Hours As Needed Nausea 2)  Metoprolol Succinate 100 Mg Xr24h-Tab (Metoprolol Succinate) .Marland Kitchen.. 1 Tab By Mouth Daily For High Blood Pressure 3)  Amitriptyline Hcl 75 Mg Tabs (Amitriptyline Hcl) .Marland Kitchen.. 1 Tab By Mouth Qhs 4)  Tramadol Hcl 50 Mg Tabs (Tramadol Hcl) .Marland Kitchen.. 1 Tab By Mouth 6 Hours As Needed Pain 5)  Miralax  Powd (Polyethylene Glycol 3350) .Marland Kitchen.. 1 Capful (17g) Mixed in 8 Ozs of Fluid By Mouth Daily As Needed Constipation; Dispense Generic, 1 Large Bottle 6)  Zofran Odt 8 Mg Tbdp (Ondansetron) .Marland Kitchen.. 1 Tab On Tongue Every 8 Hrs As Needed Nausea/vomiting 7)  Protonix  40 Mg Tbec (Pantoprazole Sodium) .Marland Kitchen.. 1 Tab By Mouth Two Times A Day  Allergies (verified): No Known Drug Allergies  Review of Systems GI:  Complains of abdominal pain, constipation, and nausea; denies bloody stools, dark tarry stools, and diarrhea.  Physical Exam  General:  Well-developed,well-nourished,in no acute distress; alert,appropriate and cooperative throughout examination. vitals reviewed.  Abdomen:  Bowel sounds positive,abdomen soft.  +mild  tenderness in epigastric region.   without masses, organomegaly or hernias noted.   Impression & Recommendations:  Problem # 1:  EPIGASTRIC PAIN, CHRONIC (ICD-789.06) Assessment Improved Pt feeling a little better.  NO more vomiting.  Tolerating by mouth, but still nauseous.  ABX negative for air-fluid level, +constipation.  CBC, Cmet, Lipase wnl.  Advised suppository to assist with BM.  Pt to continue protonix 40mg  two times a day and Zofran as needed.  Pt to see me next week.  Once feeling better, will need appt with Dr Russella Dar for EGD (pt missed appt in 08/2009).   Orders: [facility]- Est Level  3 ([address]) Gastroenterology Referral (GI)  Complete Medication List: 1)  Promethazine Hcl 12.5 Mg Tabs (Promethazine hcl) .Marland Kitchen.. 1 tab by mouth q 6 hours  as needed nausea 2)  Metoprolol Succinate 100 Mg Xr24h-tab (Metoprolol succinate) .Marland Kitchen.. 1 tab by mouth daily for high blood pressure 3)  Amitriptyline Hcl 75 Mg Tabs (Amitriptyline hcl) .Marland Kitchen.. 1 tab by mouth qhs 4)  Tramadol Hcl 50 Mg Tabs (Tramadol hcl) .Marland Kitchen.. 1 tab by mouth 6 hours as needed pain 5)  Miralax Powd (Polyethylene glycol 3350) .Marland Kitchen.. 1 capful (17g) mixed in 8 ozs of fluid by mouth daily as needed constipation; dispense generic, 1 large bottle 6)  Zofran Odt 8 Mg Tbdp (Ondansetron) .Marland Kitchen.. 1 tab on tongue every 8 hrs as needed nausea/vomiting 7)  Protonix 40 Mg Tbec (Pantoprazole sodium) .Marland Kitchen.. 1 tab by mouth two times a day  Patient Instructions: 1)  Please schedule a follow-up appointment on  Monday with Dr Janalyn Harder (ok to double book). 2)  We will try to reach Dr Ardell Isaacs office for EGD. 3)  For constipation: try suppository today.  4)  Continue protonix two times a day for inflammation.   5)  Stick to bland diet of broth, chicken noodle soup.  Avoid acidy foods like tomato base foods.  6)  Tobacco is very bad for your health and your loved ones ! You should stop smoking !  7)  Stop smoking tips: Choose a quit date. Cut down before the quit date. Decide what you will do as a substitute when you feel the urge to smoke(gum, toothpick, exercise).

## 2010-07-14 NOTE — Progress Notes (Signed)
   Phone Note Call from Patient   Caller: Patient Call For: [phone] (W) or [phone] (H) Summary of Call: Tony [name] had to change his appt from 10/25  to 11/9.  He's currently out of his meds for nausea.  Would lik e a refill called to [facility] on [address]. Initial call taken by: Abundio Miu,  March 28, 2010 3:44 PM    Prescriptions: PROMETHAZINE HCL 12.5 MG TABS (PROMETHAZINE HCL) 1 tab by mouth q 6 hours as needed nausea  #30 x 1   Entered and Authorized by:   Angeline Slim MD   Signed by:   Angeline Slim MD on 03/28/2010   Method used:   Electronically to        [facility]. [address]. [phone]* (retail)       [address]       [place], [place]  [address]       Ph: [phone] or [phone]       Fax: [phone]   RxID:   [id number]

## 2010-07-14 NOTE — Letter (Signed)
Summary: New Patient letter  [facility]  [address] [place], [place] [address]   Phone: [phone]  Fax: [phone]       07/27/2009 MRN: [id number]  [facility] [address] [place], [place]  [address]  Dear Tony [name],  Welcome to the Gastroenterology Division at [facility].    You are scheduled to see Dr.  Russella Dar on 08-23-09 at 9:30AM on the 3rd floor at [facility], [address]  We ask that you try to arrive at our office 15 minutes prior to your appointment time to allow for check-in.  We would like you to complete the enclosed self-administered evaluation form prior to your visit and bring it with you on the day of your appointment.  We will review it with you.  Also, please bring a complete list of all your medications or, if you prefer, bring the medication bottles and we will list them.  Please bring your insurance card so that we may make a copy of it.  If your insurance requires a referral to see a specialist, please bring your referral form from your primary care physician.  Co-payments are due at the time of your visit and may be paid by cash, check or credit card.     Your office visit will consist of a consult with your physician (includes a physical exam), any laboratory testing he/she may order, scheduling of any necessary diagnostic testing (e.g. x-ray, ultrasound, CT-scan), and scheduling of a procedure (e.g. Endoscopy, Colonoscopy) if required.  Please allow enough time on your schedule to allow for any/all of these possibilities.    If you cannot keep your appointment, please call [phone] to cancel or reschedule prior to your appointment date.  This allows [place] the opportunity to schedule an appointment for another patient in need of care.  If you do not cancel or reschedule by 5 p.m. the business day prior to your appointment date, you will be charged a $50.00 late cancellation/no-show fee.    Thank you for choosing [facility] for your medical needs.  We appreciate the opportunity to care for you.  Please visit [place] at our website  to learn more about our practice.                     Sincerely,                                                             The Gastroenterology Division

## 2010-08-31 LAB — COMPREHENSIVE METABOLIC PANEL
ALT: 89 U/L — ABNORMAL HIGH (ref 0–53)
Albumin: 2.9 g/dL — ABNORMAL LOW (ref 3.5–5.2)
Albumin: 4 g/dL (ref 3.5–5.2)
Alkaline Phosphatase: 76 U/L (ref 39–117)
BUN: 18 mg/dL (ref 6–23)
BUN: 5 mg/dL — ABNORMAL LOW (ref 6–23)
CO2: 22 mEq/L (ref 19–32)
GFR calc Af Amer: >60 mL/min (ref >60)
Glucose, Bld: 146 mg/dL — ABNORMAL HIGH (ref 70–99)
Potassium: 3.3 mEq/L — ABNORMAL LOW (ref 3.5–5.1)
Potassium: 4 mEq/L (ref 3.5–5.1)
Sodium: 139 mEq/L (ref 135–145)
Total Protein: 5.8 g/dL — ABNORMAL LOW (ref 6.0–8.3)

## 2010-08-31 LAB — CBC
HCT: 34.5 % — ABNORMAL LOW (ref 39.0–52.0)
HCT: 34.9 % — ABNORMAL LOW (ref 39.0–52.0)
HCT: 37.8 % — ABNORMAL LOW (ref 39.0–52.0)
Hemoglobin: 11.6 g/dL — ABNORMAL LOW (ref 13.0–17.0)
Hemoglobin: 12.8 g/dL — ABNORMAL LOW (ref 13.0–17.0)
MCHC: 33.5 g/dL (ref 30.0–36.0)
MCV: 93.7 fL (ref 78.0–100.0)
MCV: 94.4 fL (ref 78.0–100.0)
Platelets: 185 10*3/uL (ref 150–400)
RBC: 4.02 MIL/uL — ABNORMAL LOW (ref 4.22–5.81)
RDW: 13.1 % (ref 11.5–15.5)
RDW: 13.1 % (ref 11.5–15.5)

## 2010-08-31 LAB — BASIC METABOLIC PANEL
BUN: 5 mg/dL — ABNORMAL LOW (ref 6–23)
BUN: 6 mg/dL (ref 6–23)
CO2: 27 mEq/L (ref 19–32)
Calcium: 7.4 mg/dL — ABNORMAL LOW (ref 8.4–10.5)
Calcium: 8.3 mg/dL — ABNORMAL LOW (ref 8.4–10.5)
Chloride: 104 mEq/L (ref 96–112)
Creatinine, Ser: 1.04 mg/dL (ref 0.4–1.5)
GFR calc Af Amer: >60 mL/min (ref >60)
GFR calc non Af Amer: >60 mL/min (ref >60)
GFR calc non Af Amer: >60 mL/min (ref >60)
Glucose, Bld: 105 mg/dL — ABNORMAL HIGH (ref 70–99)
Glucose, Bld: 117 mg/dL — ABNORMAL HIGH (ref 70–99)
Potassium: 3.4 mEq/L — ABNORMAL LOW (ref 3.5–5.1)
Potassium: 3.7 mEq/L (ref 3.5–5.1)
Potassium: 4 mEq/L (ref 3.5–5.1)
Sodium: 136 mEq/L (ref 135–145)
Sodium: 139 mEq/L (ref 135–145)

## 2010-08-31 LAB — DIFFERENTIAL
Lymphocytes Relative: 5 % — ABNORMAL LOW (ref 12–46)
Lymphs Abs: 0.6 10*3/uL — ABNORMAL LOW (ref 0.7–4.0)
Monocytes Relative: 4 % (ref 3–12)
Neutro Abs: 10.4 10*3/uL — ABNORMAL HIGH (ref 1.7–7.7)
Neutrophils Relative %: 91 % — ABNORMAL HIGH (ref 43–77)

## 2010-08-31 LAB — URINALYSIS, ROUTINE W REFLEX MICROSCOPIC
Glucose, UA: 100 mg/dL — AB
Hgb urine dipstick: NEGATIVE
Protein, ur: 100 mg/dL — AB

## 2010-08-31 LAB — URINE MICROSCOPIC-ADD ON

## 2010-10-25 NOTE — Op Note (Signed)
NAMEARIO, MCDIARMID               ACCOUNT NO.:  [id number]   MEDICAL RECORD NO.:  [id number]          PATIENT TYPE:  AMB   LOCATION:  [facility]                          FACILITY:  [facility]   PHYSICIAN:  Feliberto Gottron. Turner Daniels, M.D.   DATE OF BIRTH:  1965-10-29   DATE OF PROCEDURE:  06/15/2008  DATE OF DISCHARGE:                               OPERATIVE REPORT   PREOPERATIVE DIAGNOSIS:  Left knee lateral meniscal tear.   POSTOPERATIVE DIAGNOSIS:  Left knee lateral meniscal tear with the  addition of chondromalacia patella grade 3 and the anterior portion of  the lateral tibial plateau also grade 3 with calcifications in the  cartilage.   PROCEDURE:  Left knee partial arthroscopic lateral meniscectomy from the  midlateral to the anterior horn of the lateral meniscus and debridement  of chondromalacia.   SURGEON:  Feliberto Gottron. Turner Daniels, MD   FIRST ASSISTANT:  None.   ANESTHETIC:  General LMA with local given at the end 0.5% Marcaine 20  mL.   ESTIMATED BLOOD LOSS:  Minimal.   FLUID REPLACEMENT:  A liter of crystalloid.   TOURNIQUET TIME:  None.   INDICATIONS FOR PROCEDURE:  A 45 year old gentleman injured at work and  initially underwent a partial lateral meniscectomy some months ago as  part of his workers comp claim.  He then had recurrence of his symptoms  a few months later and because of this, an MRI scan was accomplished  showing a new tears of the anterior horn of the lateral meniscus.  This  was reviewed with the patient as he was having a lateral pain as well as  some parapatellar pain and because of this, he desires elective  arthroscopic evaluation and treatment of his left knee for catching,  popping, and pain if has been getting worse not better.  Risks and  benefits of surgery were discussed, questions answered.   DESCRIPTION OF PROCEDURE:  The patient was identified by armband and  received preoperative IV antibiotics at [facility].  He was  taken to operating room 1,  appropriate anesthetic monitors were attached  and general LMA anesthesia induced with the patient in supine position.  Lateral post applied to the table, left lower extremity was prepped and  draped in usual sterile fashion from the ankle to the midthigh and a  standard time-out procedure was then performed.  Utilizing the old  portals, we went ahead made standard inferomedial, inferolateral,  peripatellar portals with a #11 blade and inserted the arthroscope  through the inferolateral portal set the pump pressure around 70 mmHg  and began diagnostic arthroscopy.  The patient did have some new  cartilage flap tears of the apex and medial facet of the patella  debrided back to a stable margin with a 3.5 Gator and a 4.2 great white  sucker shaver.  Moving into the medial compartment, the articular and  meniscal cartilage were pristine as were the cruciate ligaments.  On the  lateral side, the patient did have complex multiplane tearing of the  lateral meniscus from midlateral to anterior and this was debrided back  to stable margin with a 3.5 gator sucker shaver with a 4.2 great white  as well as straight and bent biters.  We also made an accessory far  medial portal to enhance our ability to remove the anterior horn of the  lateral meniscus, which was torn.  Once this was accomplished, the  patient did have grade 3 chondromalacia with calcifications of the  anterior cartilage of the lateral tibial plateau and this was shaved  back to a stable margin with a 3.5 Gator sucker shaver.  The gutters  were cleared medially and laterally and the posterior compartment was  also cleared using the arthroscope.  The knee was irrigated out with  normal saline solution.  The arthroscopic instruments were removed and a  dressing of Xeroform, 4 x 4 dressing, sponges, Webril, and an Ace wrap  applied.  The patient was awakened and taken to the recovery room  without difficulty.      Feliberto Gottron. Turner Daniels,  M.D.  Electronically Signed     FJR/MEDQ  D:  06/15/2008  T:  06/15/2008  Job:  [id number]

## 2010-10-25 NOTE — H&P (Signed)
NAMESTRAN, RAPER               ACCOUNT NO.:  [id number]   MEDICAL RECORD NO.:  [id number]          PATIENT TYPE:  INP   LOCATION:  5729                         FACILITY:  [facility]   PHYSICIAN:  Zenaida Deed. Mayford Knife, M.D.DATE OF BIRTH:  08/08/1965   DATE OF ADMISSION:  05/15/2007  DATE OF DISCHARGE:  05/17/2007                              HISTORY & PHYSICAL   PRIMARY CARE PHYSICIAN:  Ardeen Garland, M.D., at [facility].   CHIEF COMPLAINT:  Abdominal pain, vomiting and dehydration.   HISTORY OF PRESENT ILLNESS:  Mr. Gillespie is a 45 year old with history  of gastric ulcer and more recently Helicobacter pylori gastritis in  August of 2007. At that time, he had an negative EGD and a negative  colonoscopy. Today, he presents with a five-day history of waxing and  waning sharp cramping epigastric and right lower quadrant pain. This is  very similar to the episode he had last year. Also, he complained of  nausea and intractable vomiting since Saturday. He has been unable to  keep any liquids down. He denies diarrhea. He had a normal bowel  movement yesterday morning without blood. He is passing gas. He denies  fever. He was treated at least partially for Helicobacter pylori by  gastroenterologist, but he has not been able to see his  gastroenterologist since his normal studies last year due to monetary  issues. He is currently on medications. His pain is somewhat improved  after morphine given in the ER which unfortunately made him see things  and Dilaudid 2 mg IV x2 which he tolerates fine. Zofran also helped  vomiting. He had no blood in his vomit. He denies melena or  hematochezia. He denies dizziness. He had an abdominal CT performed in  the ER that showed borderline distended loop of small bowel but no  definite obstruction.   PAST MEDICAL HISTORY:  1. Gastric ulcer.  2. Gastritis 2007 followed by a workup including a negative EGD and a      negative  colonoscopy. He was per gastroenterology notes ruled out      for Crohn's disease and celiac disease.   CURRENT MEDICATIONS:  None.   ALLERGIES:  No known drug allergies.   PAST SURGICAL HISTORY:  Knee surgery.   FAMILY HISTORY:  Father is deceased age 11 of a myocardial infarction.  Mother had a seizure disorder.   REVIEW OF SYSTEMS:  Negative for weight loss. Negative for fever.  Positive for dysuria today. Greater than 12 systems reviewed and are  negative except for above and per HPI.   PHYSICAL EXAMINATION:  Temperature 99.1, blood pressure 130/76, pulse  72, respiratory rate 18, 98% on room air.  GENERAL:  Thin, African-American male in moderate distress. Alert and  oriented x3.  HEENT:  Pupils are equal, round, and reactive to light and  accommodation. Extraocular muscles intact. Dry mucous membranes.  CARDIOVASCULAR:  Regular rate and rhythm. No murmurs, rubs, or gallops.  Capillary refill less than 2 seconds.  PULMONARY:  Clear to auscultation bilaterally. No wheezes or crackles.  Normal effort.  ABDOMEN:  Soft. Voluntary guarding in the epigastrium and right lower  quadrant. No rebound. Hyperactive bowel sounds. No masses. No  hepatosplenomegaly.  EXTREMITIES:  Nontender. No edema. Well perfused.  NEUROLOGICAL:  Nonfocal.   LABORATORY DATA:  White blood count 6.8, hemoglobin 15.9, hematocrit 47,  platelets 293, ANC 4.9. Sodium was 136, potassium 3.9, chloride 101,  bicarb 26, BUN 15, creatinine 1.25, glucose 112, calcium 9.6. Total  bilirubin 1.3, AST 23, ALT 37, total protein 7.3, albumin 4.3, lipase  14. Urinalysis showed a specific gravity of 1.036 with small bilirubin,  greater than 80 ketones, 30 protein, negative leukocyte esterase,  negative nitrites, and micro was within normal limits. CT of the abdomen  and pelvis showed a borderline gas-distended loop of small bowel in the  central abdomen. No definite obstruction. Recommended clinical  observation with  follow up of plain film of the abdomen in the morning.  He had a stable left adrenal nodule, likely an adenoma and a normal  appendix.   ASSESSMENT AND PLAN:  This is a 45 year old male with abdominal pain,  intractable vomiting and dehydration, likely recurrent gastritis.   1. Gastrointestinal. No signs of infection. Likely this is recurrent      gastritis. Inflammation on the CT scan concerning for early small-      bowel obstruction. Will repeat a 2-view of the abdomen in the      morning to reevaluate. We will call GI in the morning so that they      can plan further workup of his possible gastritis. He has no signs      of active bleeding. We will guaiac his stools and emesis and repeat      a CBC in the morning. We will give him a clear liquid diet and      Protonix 40 mg IV b.i.d. We will give him Zofran and Dilaudid for      pain until he can take p.o. We will watch his mental status and      respiratory status closely while on Dilaudid.  2. Dehydration. We will give him a 1-liter normal saline bolus and      then normal saline at 150 mL an hour and will repeat his      chemistries in the morning.      Levander Campion, M.D.  Electronically Signed      Zenaida Deed. Mayford Knife, M.D.  Electronically Signed    JH/MEDQ  D:  05/23/2007  T:  05/24/2007  Job:  [id number]

## 2010-10-25 NOTE — Discharge Summary (Signed)
Tony [name], CHAP               ACCOUNT NO.:  [id number]   MEDICAL RECORD NO.:  [id number]          PATIENT TYPE:  INP   LOCATION:  5729                         FACILITY:  [facility]   PHYSICIAN:  Tony [name], M.D.DATE OF BIRTH:  July 25, 1965   DATE OF ADMISSION:  05/15/2007  DATE OF DISCHARGE:                               DISCHARGE SUMMARY   POSSIBLE DISCHARGE DATE:  May 17, 2007.   PRIMARY CARE PHYSICIAN:  Dr. Georgiana Shore at [facility].   DIAGNOSIS:  Unexplained abdominal pain, ileus versus gastritis verus  duodenitis versus bowel distention.   DISCHARGE MEDICATIONS:  1. Benadryl 25 mg p.o. q.6 hours as needed for nausea and vomiting.  2. Protonix 40 mg p.o. b.i.d.  3. Zofran 4 mg p.o. q.6 hours as needed for nausea and vomiting.  4. Colace 100 mg p.o. b.i.d.  5. Percocet 5/325 one tablet p.o. q.4 hours as needed for pain.   CONSULTS:  GI via [facility].   PROCEDURE:  The patient had a CT of the abdomen, done on May 15, 2007, that showed borderline gas distended loop of the small bowel in  the central abdomen with no signs of obstruction and also a stable left  adrenal nodule.  The patient had an abdominal ultrasound, done on  May 16, 2007, that showed prominent central small bowel loop.  The  patient had a repeat ultrasound done on May 17, 2007, that was  negative for gallstones and was it was negative ultrasound overall.  The  patient had a CT enterogram done on May 17, 2007, that was also  negative.  No signs of lesions, lipomas or obstruction.   BRIEF HOSPITAL COURSE:  This is a 45 year old African American male  admitted for abdominal pain and nausea and vomiting.   1. Abdominal pain.  The patient was admitted, placed on IV fluids and      had pain control with Dilaudid and was placed on n.p.o.  He was      evaluated with a CT of the abdomen on day of admission, May 15, 2007, that showed borderline gas distended loop of the  small bowel      in the central abdomen with no signs of obstruction.  The patient,      the next day, underwent an ultrasound of the abdomen that showed a      prominent central small bowel loop.  A GI consult was obtained, who      recommended placing him on a PPI, which he was already on upon      admission.  The patient was also recommended for a repeat abdominal      ultrasound to look for gallstones and also a CT enterography to      rule out small bowel lesions.  The patient was also given Zofran      during this time for nausea and vomiting.  On May 17, 2007, the      patient was transitioned to oral medications, as well as oral pain      medication and  was transitioned his diet from clear liquids to full      liquids.  His abdominal ultrasound showed no gallstones and was      negative.  His CT enterogram showed no lesions or bowel      obstruction.  It was recommended that he followup as an outpatient      with Dr. Russella Dar at [facility] for a capsule swallow      test to evaluate the lining of his small bowel.  Plan for him was      to be discharged today now that he has tolerated p.o. diet and also      if his pain has been adequately controlled with oral medications.   LABORATORY DATA:  Upon admission, the patient had a CBC that showed a  white blood cell count of 6.8, hemoglobin 15.9, platelets of 293, a  lipase of 14 and hepatic function with an AST of 22, ALT of 29, alkaline  phosphatase 85 and T-bili of 1.  He had a negative UA.  His CMET was  within normal limits.  CRP was 0.1, ESR of 5.  Upon discharge, his CMET  was within normal limits.  His hepatic function showed an AST of 27, ALT  of 28, alkaline phosphatase of 87 and T-bili of 0.6.  He was discharged  in stable condition.   DISCHARGE INSTRUCTIONS:  He is to be on a bland diet and advance his  diet as tolerated.  He can return to work in a week or sooner if he is  able.  His activity is as  tolerated.  He is going to follow up with  Claudette Head at [facility], phone number [phone].  He is  to call for appointment in 2 to 3 weeks.  He is also going to followup  at [facility], phone number [phone], on May 23, 2007 at 9:30 a.m.  He needs to call his primary doctor or return to the  emergency room if the pain is not improved, if he has uncontrolled  nausea, vomiting or fever not improved with Tylenol.      Tony Ivan, MD  Electronically Signed      Tony [name], M.D.  Electronically Signed    KL/MEDQ  D:  05/17/2007  T:  05/17/2007  Job:  [id number]   cc:   Venita Lick. Russella Dar, MD, Clementeen Graham  Ardeen Garland, MD

## 2010-10-28 NOTE — Assessment & Plan Note (Signed)
[facility]                         GASTROENTEROLOGY OFFICE NOTE   Tony [name], Tony [name]                      MRN:          [id number]  DATE:04/02/2006                            DOB:          1966/02/08    OFFICE NEW PATIENT EVALUATION.   PRIMARY PHYSICIAN:  [facility], Dr. Dorathy Daft.   PROBLEM:  1. Post hospital follow up,  2. Acute abdominal pain.  3. Nausea.   HISTORY:  The patient is a pleasant 45 year old AA male who was admitted  at [facility] September 24-29, 2007 with acute abdominal pain-right  sided, associated with nausea, vomiting and inability to keep down  p.o.'s.  He was dehydrated on admission and had intractable emesis.  He  had undergone CT scan of the abdomen and pelvis during that admission  which showed a suggestion of wall thickening in the jejunum and possibly  in the stomach, raising the possibility of gastroenteritis, scattered  small mesenteric lymph nodes-reactive and ileocecal valve region  somewhat thickened, cannot rule out Crohn's.  Subsequent small bowel  follow through also done during that admission showed some nodular fold  thickening of the terminal ileum, likely inflammatory/infectious  process, unlikely Crohn's disease.  The patient responded to  conservative management in the hospital and his symptoms had improved  quite a bit by the time he was discharged.  He was able to take p.o.'s,  never had any problems with diarrhea and his nausea and vomiting had  resolved.  He was seen by GI during that admission and outpatient follow  up was planned for consideration of colonoscopy.  He was found to be H.  pylori positive during that admission and was discharged home on a  course of a PPI, clarithromycin and amoxicillin.  He comes in now for  follow up stating that he is feeling better.  He still has some mild  upper abdominal burning and occasional right lower quadrant discomfort,  though not  nearly as often as he had been having it previously.  He has  been eating but does complain of intermittent nausea without vomiting.  His bowel movements have been normal without melena or hematochezia.  He  had not had any diarrhea and no fever since discharge.   CURRENT MEDICATIONS:  1. Clarithromycin 500 b.i.d.  2. Promethazine 25 p.r.n.  3. Amoxicillin 500 b.i.d.  4. Prochlorperazine 5 mg p.r.n.  5. Omeprazole 20 b.i.d.   ALLERGIES:  NO KNOWN DRUG ALLERGIES.   PAST HISTORY:  Benign.  He does have apparent history of sleep apnea and  has had one prior knee surgery.   FAMILY HISTORY:  1. Negative for colon cancer or polyps or inflammatory bowel disease.  2. Positive for heart disease in his father.   SOCIAL HISTORY:  Patient is married, has 2 children.  He is employed as  a Agricultural consultant.  He does smoke.  He is a nondrinker.   REVIEW OF SYSTEMS:  Pertinent for difficulty sleeping and history of  sleep apnea and also lists history of heart rhythm.  All other systems  reviewed and negative with the exception  of GI as outlined above.   PHYSICAL EXAMINATION:  A well-developed African-American male in no  acute distress.  Height is 6 feet, weight is 172.  Blood pressure was  not recorded, pulse is 102.  HEENT:  Nontraumatic, normocephalic, EOMI. PERRLA.  Sclerae are  anicteric.  CARDIOVASCULAR:  Regular rate and rhythm with S1 and S2.  No murmur, rub  or gallop.  PULMONARY:  Clear to A&P.  ABDOMEN:  Soft, bowel sounds are active.  He is tender in the right  lower quadrant.  There is no palpable mass or hepatosplenomegaly.  No  guarding or rebound.  RECTAL:  Not done at this time.  EXTREMITIES:  Pertinent for a tattoo on the right forearm.  No clubbing,  cyanosis or edema.  NEURO:  Grossly nonfocal.   IMPRESSION:  A 45 year old African-American male with recent  hospitalization for acute abdominal pain, nausea and intractable  vomiting with abnormal CT scan, rule out  infectious enteritis versus  underlying inflammatory bowel disease.  Patient with some persistent  right lower quadrant discomfort and nausea.   PLAN:  1. Schedule upper endoscopy.  2. Schedule colonoscopy.  3. Stop amoxicillin and clarithromycin as may be contributing to his      persistent nausea.  4. Decrease Prilosec to 20 mg q.a.m.      Mike Gip, PA-C  Electronically Signed      Venita Lick. Russella Dar, MD, [facility]  Electronically Signed   AE/MedQ  DD: 05/10/2006  DT: 05/11/2006  Job #: [id number]   cc:   Adrian Blackwater, MD

## 2010-10-28 NOTE — H&P (Signed)
Tony [name], Tony [name]               ACCOUNT NO.:  [id number]   MEDICAL RECORD NO.:  [id number]          PATIENT TYPE:  INP   LOCATION:  5733                         FACILITY:  [facility]   PHYSICIAN:  Santiago Bumpers. Hensel, M.D.DATE OF BIRTH:  [birth year]   DATE OF ADMISSION:  03/05/2006  DATE OF DISCHARGE:                                HISTORY & PHYSICAL   CHIEF COMPLAINT:  Abdominal pain, nausea and emesis.   HISTORY OF PRESENT ILLNESS:  Tony [name] is a 45 year old male with history  of ulceropathic disease.  He comes today complaining of 3-day history of  abdominal pain, nausea and vomiting.  He first noted the abdominal pain on  Friday evening when he was watching TV.  He had an early dinner consisting  of steak.  He describes his pain as located in the upper abdomen and right  lower abdomen.  He describes it as a sharp, cramping pain that comes and  goes, makes him bend, and is followed by nausea and vomiting.  He has been  unable to keep any food or fluids down in the last 3 days.  He is feeling  weak and his abdominal pain is also associated with chills but no fever.  He  denies any diarrhea or melena.  He reports some blood streaks in his  vomiting since yesterday.  He denies any sick contacts.  He was seen at  [facility] emergency department, had an abdominal and pelvic CT scan  concerning for ileitis.  His symptoms were not improving with symptomatic  treatment, and he was transferred to our service for further management.   REVIEW OF SYSTEMS:  No recent weight changes.  Chills with pain but no  fever.  The patient denies headache.  CARDIOVASCULAR:  No chest pain, no  palpitations.  RESPIRATORY:  No shortness of breath.  GASTROINTESTINAL:  As  explained above.  The patient denies diarrhea or constipation.  Very minimal  p.o. intake in the last 3 days.  GENITOURINARY:  Burning on urination.  Dark, concentrated urine.  SKIN:  No rashes, no jaundice.  The rest of  review of systems  is unremarkable.   MEDICATIONS:  The patient had prescribed omeprazole in the past.  He has not  been taking this medication in almost 2 years.   PAST MEDICAL HISTORY:  1. The patient was admitted in the hospital with acute gastritis in 2005.  2. History of a heart murmur.  3. History of left knee arthroplasty in 1994.   ALLERGIES:  No known drug allergies.   FAMILY HISTORY:  Father died at 38 with myocardial infarction.  Mother had a  history of seizure disorder.  Rest unrelated.  No history of Crohn disease  or IBS.   SOCIAL HISTORY:  The patient lives with his wife and 2 children.  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  He smokes about 1 pack of  cigarettes per day.  He denies any alcohol or illicit drug use.   OBJECTIVE:  VITAL SIGNS:  Temperature 99.1, heart rate 94, respirations 20,  blood pressure 138/72.  O2 saturation is 97% on room air.  GENERAL:  This is a fit African-American male who looks uncomfortable due to  pain.  He is alert and oriented x3.  HEENT:  Atraumatic, normocephalic head.  Pupils equally round, reactive to  light and accommodation.  Extraocular movements are intact.  There is no  scleral jaundice.  There is no conjunctival erythema.  Oropharynx is clear.  The patient has poor dentition.  He has dry, scaly lips and dry mucous  membranes but no ulcers or aphthous lesions inside the mouth.  CARDIOVASCULAR:  He has a regular rate and rhythm.  He has a very subtle,  maybe 2/6, systolic ejection murmur better heard above the mitral valve  projection area, without radiation.  LUNGS:  Clear to auscultation bilaterally.  ABDOMEN:  Soft, nondistended.  It is tender to deep palpation in the  epigastric and right lower quadrant area.  There is no guarding.  There is  mild rebound in the right lower quadrant.  There is no hepatomegaly or  splenomegaly. He has positive bowel sounds.  RECTAL:  No masses or fissures in the anal.  There is normal tone of  the  anal sphincter.  There are no rectal masses.  Ampulla is empty.  Prostate is  not enlarged.  The patient is guaiac-negative.  NEUROLOGIC:  The patient moves all extremities.  There is no asterixis, no  clonus, no focalization.  SKIN:  No rashes, no jaundice.   LABORATORY DATA:  White blood cell count 5.9, hemoglobin 14.2, hematocrit  41.4, platelets 290.  Neutrophilic count 74%, lymphocytic count 18%.  MCV  91.7.  Sodium 139, potassium 3.9, chloride 107, CO2 25, BUN 14, creatinine  1.3, glucose 85, calcium 8.6.  Urinalysis shows a specific gravity of 1.036,  ketones over 80, protein 30, no nitrite, negative leukocyte esterase,  negative blood, negative glucose, small bilirubin.  Micro within normal  limits.  CT scan of the abdomen and pelvis shows mucosal thickening of the  stomach, small bowel, concerning for enteritis, gastritis and ileitis,  suspicious for Crohn disease, and there is also a 2-year stable 1.0 x 1.4 cm  left adrenal mass.  The patient had an upper GI in 2005 with barium wallow  that showed severe gastritis and duodenitis with superficial erosion of the  distal antrum and duodenum and small hiatal hernia.   ASSESSMENT AND PLAN:  A 45 year old male with a history of ulceropathic  disease, here for:   1. Abdominal pain, nausea and vomiting.  In the differential, ulceropathic      disease versus infection, gastroenteritis versus inflammatory Crohn      disease.  Unlikely bacterial gastroenteritis caused by, example,      Shigella, Salmonella, Yersinia or E. coli, as there are no dysenteric-      type symptoms.  There is no increased white blood cell count.  There is      no diarrhea, and there is no fever.  Still possible viral      gastroenteritis, though.  Unlikely ulcerative colitis in the absence of      gross blood in the stool.  Viability of symptoms and findings in the CT     scan are suggestive of Crohn disease as 30% of cases manifest with      ileitis and  a small percentage have gastroduodenal involvement.      Unlikely appendicitis, biliary disease or diverticular disease based on      CT findings.  I will  order a KUB to rule out small bowel obstruction.      Will treat symptoms with Protonix IV 40 mg b.i.d., Zofran IV 4 mg q.6h.      p.r.n., and morphine IV 2 mg q.6h. p.r.n.  I will also order lipase,      liver enzymes, urine drug screen, alcohol level, C-reactive protein,      erythrocyte sedimentation rate, H. pylori antigen, and we will test      stools for guaiac x3.  If no improvement, will consult GI in the      morning.  We will hold barium study for now until GI evaluation is      performed, as the patient would probably have endoscopic studies while      he is in the hospital.  I will consider treatment with steroids if      diagnosis of Crohn's is confirmed and also consider ciprofloxacin if a      dysenteric-type picture develops.  2. Increased creatinine.  Likely acute renal failure secondary to      dehydration.  I will give another bolus of 1 L normal saline and then      will place patient on maintenance IV fluids, D5 normal saline at 150      mL/hr.  3. Left adrenal mass.  Stable in the last 2 years.  This is likely an      incidental finding not concerning for malignancy as per the radiology      report.  4. Fluids, electrolytes, and nutrition.  Electrolytes are stable.  We will      keep the patient n.p.o. for now and continue D5 normal saline at 150      mL/hr.   DISPOSITION:  Will admit to a regular bed.      Sharin Grave, MD    ______________________________  Santiago Bumpers Leveda Anna, M.D.    AM/MEDQ  D:  03/05/2006  T:  03/06/2006  Job:  [id number]

## 2010-10-28 NOTE — Discharge Summary (Signed)
NAMETAWAN, DEGROOTE               ACCOUNT NO.:  [id number]   MEDICAL RECORD NO.:  [id number]          PATIENT TYPE:  INP   LOCATION:  5727                         FACILITY:  [facility]   PHYSICIAN:  Sharin Grave, MD  DATE OF BIRTH:  01-Feb-1966   DATE OF ADMISSION:  03/10/2006  DATE OF DISCHARGE:  03/10/2006                                 DISCHARGE SUMMARY   ADMISSION DATE:  March 05, 2006.   DISCHARGE DATE:  March 10, 2006.   ATTENDING OF RECORD:  Dr. Zachery Dauer.   CONSULTS:  Dr. Claudette Head [facility].   PRIMARY CARE Joelys Staubs:  Dr. Dorathy Daft at [facility].   DISCHARGE DIAGNOSES:  1. Endarteritis.  2. Dehydration, resolved at discharge.  3. H. pylori  positive.   DISCHARGE MEDICATIONS:  1. Zofran 8 mg p.o. q.8 hours p.r.n. for nausea.  2. Compazine 5 mg, 1 or 2 tabs q.6 hours p.r.n. for nausea.  3. Vicodin 500/5 mg, 1 or 2 tabs q.4 p.r.n. for abdominal pain.  4. Prilosec 40 mg p.o. daily.   DISCHARGE INSTRUCTIONS:  Patient was discharged on a Saturday, he was  instructed to call Dr. Russella Dar, [place]'s office number [phone] for a  followup appointment.  He needs to have a colonoscopy done in 3 weeks after  discharge.  The patient was also asked to call [facility],  number [phone] for a followup appointment with primary care Elenie Coven,  Dr. Dorathy Daft for a hospital followup appointment in 3-5 days after  discharge.   STUDIES:  Contrast CT scan of the pelvis showed no signs of appendix  inflammation, thickening of the terminal ileum close to the ileocecal valve,  this study was done on September 24th.  KUB on September 25th showed a non-  obstructive bowel or gas pattern, no acute findings, and barium small-bowel  with follow-through showed nodular wall thickening of the terminal ileum  likely inflammatory versus infectious and likely Crohn's disease.   HOSPITAL COURSE:  For admission history please  refer to dictated H&P for  details.  Briefly, Mr. Henery is a 45 year old male with a past medical  history significant for ulcer hepatic disease that came complaining of a 3-  day history of right lower quadrant pain, nausea, vomiting, and unable to  hold any liquids or food for 3 days.  He was found to be dehydrated on  admission and also presented with intractable emesis reason why he was  admitted.  He presented at [facility], was transferred to [facility] to be admitted at [facility].   BY PROBLEMS:  1. Abdominal pain.  The patient's liver enzymes remained normal during      hospitalization and he had a transglutaminase antibody AGA that was 0.3      which ruled out celiac enteropathy.  Patient had normal C-reactive      protein and erythrocyte sedimentation rate patient rate and patient's      urine drug screen was negative for amphetamines, marijuana, cocaine,      and just positive for opioid but patient  had already received opioid      for pain control in the hospital.  The patient was ruled out for      obstruction with a radiologic studies.  His pain was controlled      initially with IV narcotics and subsequently he was transition to p.o.      pain medication.  GI was consulted and patient had a small-bowel follow-      through on the 27th that showed some thickening of the jejunum and the      terminal ilium and nodular fold but appearance not typical for Crohn's      disease.  The GI specialist in the differential mentioned self-limited      inflammatory infection process or lymphoid hyperplasia and mentioned      that in the differential we need to include lymphoma.  Although GI      specialist reported that he cannot completely exclude Crohn's disease      and decided to continue endoscopic studies as an outpatient and patient      will need to call his office to schedule a colonoscopy in 3 weeks after      discharge.  His abdominal pain  was improved through his hospital stay      and he was able to tolerate solids by the time of discharge and his      pain was still present but much improved and easily controlled on p.o.      pain medications.  2. Dehydration.  Patient was admitted very dehydrated with ketones in his      urine and dry mucous membranes, tachycardia, and other signs of      clinical dehydration.  He was aggressively hydrated initially and then      his diet was advanced as tolerated by the 48 hours prior to discharge.      The patient's admission creatinine was 1.3, it was believed that he      presented with acute renal failure secondary to dehydration.  His IV      fluid would actually stop 48 hours prior to discharge and his discharge      creatinine was also 1.3 but he still did not have completely optimal      p.o. and this needs to be monitored.  3. Nausea and intractable emesis.  The patient was initially treated with      IV medications for nausea and then transitioned to p.o. meds.  The last      emesis was about 48 hours prior to discharge.  He still complained of      mild nausea on discharge today and he was discharged home on p.o.      Zofran and p.o. Compazine p.r.n.   DISPOSITION:  The patient was discharged home on improved and stable  condition to follow up at [facility] in 3 weeks for further  endoscopic studies (colonoscopy and he was asked to follow up at [facility], treat for 5 days after discharge).   ISSUES TO FOLLOW UP:  Patient was found to be H. pylori positive during this  hospitalization, we did not start treatment because of patient's symptoms  but consider start treatment as an outpatient:  1. Nausea and abdominal pain is completely resolved.  2. Colonoscopy in 3 weeks after discharge.   DISCHARGE LABS:  On September 27th, sodium 138, potassium 3.7, chloride 106,  CO2 24, glucose 61, BUN 7, creatinine 1.3, calcium 8.4.  CBC hemoglobin 12.6,  hematocrit 37.1, white blood cell count 5.5, platelets 248, MCV 92.6.      Sharin Grave, MD     AM/MEDQ  D:  03/10/2006  T:  03/10/2006  Job:  [id number]

## 2010-10-28 NOTE — Discharge Summary (Signed)
NAMECRAYTON, Tony [name]                         ACCOUNT NO.:  [id number]   MEDICAL RECORD NO.:  [id number]                   PATIENT TYPE:  INP   LOCATION:  5735                                 FACILITY:  [facility]   PHYSICIAN:  Leighton Roach McDiarmid, M.D.             DATE OF BIRTH:  [birth year]   DATE OF ADMISSION:  10/26/2003  DATE OF DISCHARGE:  10/28/2003                                 DISCHARGE SUMMARY   DISCHARGE DIAGNOSES:  1. Gastritis-induced enteritis.  2. Diarrhea.  3. History of peptic ulcer disease.   DISCHARGE MEDICATIONS:  1. Prilosec OTC 40 mg one p.o. daily.  2. Phenergan 12.5 mg 1-2 p.o. q.6h. p.r.n. for nausea.  3. Tylenol 325 mg 1-2 q.4-6h. as needed for pain.   PROCEDURES:  1. CT of the abdomen with contrast revealed no acute findings.  Suspicion of     focal fatty infiltration of the liver, medial segment of the left hepatic     lobe.  Less than 1 cm low density right hepatic lesion with     characteristics of a small cyst, but too small to definitively     characterize.  A 12 mm left adrenal enhancing nodule suspicious for     adenoma.  2. CT of the pelvis with contrast.  No acute findings.  3. Upper GI series with barium swallow.  Official result pending at the time     of discharge, but unofficially read as severe changes of gastritis and     duodenitis with possible ulcers.  Formal report to follow.   HISTORY OF PRESENTING ILLNESS:  This is a 45 year old African-American male  patient of Dr. Delfin Gant of [facility], who presented  on Oct 26, 2003, complaining of a 6-day history of suprapubic abdominal  pain.  The patient states that at that time he had documented an  approximately 9 pound weight loss in 4 days, decreased appetite, constant  emesis, and diarrhea initially, but then no bowel movement for 4 days.  The  patient states the pain radiates to the epigastrium.  He denied melena,  hematochezia, or hematemesis.  The pain was  aggravated by movement,  decreased by lying still.  The patient has a past medical history  significant for a gastric ulcer, but he has never had pain this severe.  He  does admit to subjective fevers that he said had resolved prior to  admission.   ADMISSION PHYSICAL AND LABORATORY DATA:  VITAL SIGNS:  Temperature 98.3,  blood pressure 152/88, heart rate 85, respirations 16.  GENERAL:  This patient appeared ill with dry mucous membranes, without  lymphadenopathy.  CHEST:  Clear to auscultation bilaterally.  HEART:  Regular rate and rhythm with a faint 1/6 systolic ejection murmur at  the right sternal border.  ABDOMEN:  Significant for tenderness to palpation over the suprapubic,  periumbilical, and epigastric regions.  Positive rebound.  Positive bowel  sounds.  Soft.  Two 1 cm pustular round lesions - one in the left forearm,  and one in the right upper arm.  No lymphadenopathy.  NEUROLOGIC:  Nonfocal.  RECTAL:  Hemoccult negative.  Positive prostate tenderness on palpation.  No  masses noted in the rectal vault.   LABORATORY DATA:  All labs were pending at the time of admission.   HOSPITAL COURSE:  The patient was admitted to the family practice teaching  service, floor bed.  Hydrated aggressively with normal saline, and then  switched to D-5 half normal saline.  Maintained on Phenergan p.r.n., Tylenol  p.r.n., Protonix 40 mg IV daily.  On hospital day #2, the patient felt  somewhat better, but was still having palpable epigastric tenderness and  complaining of abdominal discomfort.  He was scheduled for an upper GI  barium series to look for peptic ulcer disease, and blood was drawn for an  H. pylori antibody.  When the patient was taken to radiology for an upper GI  series, it was found that he had too much residual contrast in his colon  from the initial CT of the abdomen and pelvis; therefore, the procedure was  delayed until hospital day #3.  The morning of discharge, he had  an upper GI  series that revealed the findings as described above in procedures.  He was  afebrile on the day of discharge, complaining of less pain, and tolerating a  full diet.   DISCHARGE LABORATORIES:  White count 5.6, hemoglobin 14.4, platelets 230.  Sodium 137, potassium 3.9, chloride 103, bicarb 25, BUN 12 creatinine 1.2,  glucose 110.  ESR is 6.  HIV nonreactive.  Urinalysis showed specific  gravity of 1.037, negative glucose, small bilirubin, 40 ketones.  The  patient's H. pylori antibody came back the day of discharge as 4.4, which  was positive.  Serum lipase 29, within normal limits.  Additional  laboratories revealed a total bilirubin of 1.1, alkaline phosphatase 80, AST  19, ALT 21, total protein 7.3, albumin 4.0, calcium 9.4.   Given the new finding of H. pylori positivity, will send the patient home on  triple therapy including the aforementioned Prilosec, in addition to  erythromycin 500 mg one p.o. b.i.d. for 14 days, and amoxicillin 1 gm p.o.  b.i.d. for 14 days.  The patient was instructed to avoid spicy foods,  caffeine, and mints.   FOLLOW UP:  Scheduled for a follow up with Dr. Rodolph Bong at [facility] on Thursday, Nov 05, 2003 at 3:20 p.m.  Follow up  issues should include acquisition of official upper GI report, and pursuance  of GI consult for likely peptic ulcer disease.      Franklyn Lor, MD                            Etta Grandchild, M.D.    TD/MEDQ  D:  10/28/2003  T:  10/29/2003  Job:  [id number]   cc:   Rodolph Bong, M.D.  [address] [place], [place] [address]  Fax: [phone]

## 2011-03-03 ENCOUNTER — Ambulatory Visit (INDEPENDENT_AMBULATORY_CARE_PROVIDER_SITE_OTHER): Payer: BC Managed Care – PPO | Admitting: Family Medicine

## 2011-03-03 ENCOUNTER — Encounter: Payer: Self-pay | Admitting: Family Medicine

## 2011-03-03 VITALS — BP 187/112 | HR 91 | Temp 98.5°F | Wt 191.0 lb

## 2011-03-03 DIAGNOSIS — R1013 Epigastric pain: Secondary | ICD-10-CM

## 2011-03-03 DIAGNOSIS — I10 Essential (primary) hypertension: Secondary | ICD-10-CM

## 2011-03-03 MED ORDER — METOPROLOL SUCCINATE ER 100 MG PO TB24: 100.0000 mg | ORAL_TABLET | Freq: Every day | ORAL | Status: DC

## 2011-03-03 MED ORDER — PROMETHAZINE HCL 12.5 MG PO TABS: 12.5000 mg | ORAL_TABLET | Freq: Four times a day (QID) | ORAL | Status: DC | PRN

## 2011-03-03 NOTE — Patient Instructions (Signed)
Thank you for coming in today. I will refill your blood pressure medicine and write you a rx for nausea medicine for the next time.   Make a follow up appointment with Dr. Fara Boros to check up your blood pressure and your stomach.

## 2011-03-03 NOTE — Progress Notes (Signed)
Mr. Tony [name] presents to clinic today for stomach pain and nausea and vomiting.  Has been present for approximately one week. However he is feeling much better today. He needs a doctor's note for work. He denied any fevers or chills. Did note diarrhea with this episode but denies any bloody diarrhea..  this episode is typical for his prior episodes. He's had a history of gastritis and ileus worked up by gastroenterology starting in 2005 and again in 2007.    Hypertension: Has run out of all of his medications is not currently taking his metoprolol. No chest pains, syncope palpitations or dyspnea on exertion.  PMH reviewed.  ROS as above otherwise neg Medications reviewed.  Exam:  BP 187/112  Pulse 91  Temp(Src) 98.5 F (36.9 C) (Oral)  Wt 191 lb (86.637 kg) Gen: Well NAD HEENT: EOMI,  MMM Lungs: CTABL Nl WOB Heart: RRR no MRG Abd: NABS, nondistended. Mildly tender over the epigastric region. No masses palpated.  Negative Murphy's sign. Exts: Non edematous BL  LE, warm and well perfused.

## 2011-03-03 NOTE — Assessment & Plan Note (Signed)
Exacerbation of this pain. Improving.  I don't think we need to order a tests or do anything today as his symptoms are resolving. Discussed warning signs raises to followup including return of symptoms fevers chills or bloody diarrhea. I recommend that he follows up with his primary care provider for further workup and evaluation. I also refilled his Phenergan in case this happens again.  I recommended he continue PPI in the meantime.

## 2011-03-03 NOTE — Assessment & Plan Note (Signed)
Blood pressure elevated today not taking his blood pressure medications I recommended that he resume his metoprolol and give him a refill with a 90 day supply. He should follow up with his PCP for titration of blood pressure medication a consideration to switching to hydrochlorothiazide. Expresses understanding

## 2011-03-20 LAB — COMPREHENSIVE METABOLIC PANEL
ALT: 31
AST: 25
AST: 27
Albumin: 3.6
Alkaline Phosphatase: 74
CO2: 26
CO2: 26
CO2: 27
Calcium: 8.4
Calcium: 8.7
Calcium: 9.6
Creatinine, Ser: 1.13
Creatinine, Ser: 1.25
GFR calc Af Amer: >60
GFR calc Af Amer: >60
GFR calc non Af Amer: >60
GFR calc non Af Amer: >60
Glucose, Bld: 112 — ABNORMAL HIGH
Potassium: 4
Sodium: 137
Total Protein: 5.8 — ABNORMAL LOW
Total Protein: 6.2

## 2011-03-20 LAB — DIFFERENTIAL
Lymphocytes Relative: 19
Lymphs Abs: 1.3
Neutrophils Relative %: 72

## 2011-03-20 LAB — CBC
Hemoglobin: 15.9
MCHC: 33.8
MCHC: 34.5
MCHC: 34.6
MCV: 91
MCV: 91.9
Platelets: 248
RBC: 4.36
RBC: 5.11
RDW: 12.7
RDW: 12.8

## 2011-03-20 LAB — HEPATIC FUNCTION PANEL
AST: 22
Albumin: 3.8
Alkaline Phosphatase: 85
Total Bilirubin: 1

## 2011-03-20 LAB — URINALYSIS, ROUTINE W REFLEX MICROSCOPIC
Ketones, ur: >80 — AB
Leukocytes, UA: NEGATIVE
Nitrite: NEGATIVE
Specific Gravity, Urine: 1.036 — ABNORMAL HIGH
pH: 6

## 2011-03-20 LAB — URINE MICROSCOPIC-ADD ON

## 2011-03-20 LAB — LIPASE, BLOOD: Lipase: 14

## 2011-04-04 ENCOUNTER — Ambulatory Visit (INDEPENDENT_AMBULATORY_CARE_PROVIDER_SITE_OTHER): Payer: BC Managed Care – PPO | Admitting: Family Medicine

## 2011-04-04 ENCOUNTER — Encounter: Payer: Self-pay | Admitting: Family Medicine

## 2011-04-04 DIAGNOSIS — F17200 Nicotine dependence, unspecified, uncomplicated: Secondary | ICD-10-CM

## 2011-04-04 DIAGNOSIS — J069 Acute upper respiratory infection, unspecified: Secondary | ICD-10-CM

## 2011-04-04 NOTE — Patient Instructions (Addendum)
I think you probably have the flu.  Unfortunately, there is no medicine that will fix it quickly.   You can continue with the acetaminophen.  It is ok to take 650 mg every 6 hours if needed for pain or fever. You can try Delsym for the cough, but it probably won't do a lot of good. You can try honey for the cough. Get lots of rest, drink fluids, and stay home.   We can set up an appt with Dr. Raymondo Band to talk about smoking.   I hope you feel better soon.  If you have trouble breathing, or any concerns, please let us know.

## 2011-04-07 DIAGNOSIS — J069 Acute upper respiratory infection, unspecified: Secondary | ICD-10-CM | POA: Insufficient documentation

## 2011-04-07 NOTE — Progress Notes (Signed)
  Subjective:    Patient ID: Tony [name], male    DOB: 06-11-1966, 46 y.o.   MRN: [id number]  HPI 45 yo with flu sx for 2 days.  Started with HA, cough, now with fevers, chills.  + diarrrhea, no n/v.  No blood in diarrhea.  +rhinorrhea. No flu shot this year.  Using tylenol, vicks vapor rub and robitussin without relief.  + sick contacts (wife, daughter with milder illness).  + smoking 1 ppd.  Considering quitting.    Review of Systemssee HPI     Objective:   Physical Exam  HENT:  Head: Normocephalic and atraumatic.  Right Ear: External ear normal.  Left Ear: External ear normal.  Mouth/Throat: Oropharynx is clear and moist. No oropharyngeal exudate.       + rhinorrhea  Eyes: Conjunctivae are normal. Right eye exhibits no discharge. Left eye exhibits no discharge. No scleral icterus.  Neck: Normal range of motion. Neck supple.  Cardiovascular: Normal rate, regular rhythm and normal heart sounds.  Exam reveals no gallop and no friction rub.   No murmur heard. Pulmonary/Chest: Effort normal and breath sounds normal. No respiratory distress. He has no wheezes. He has no rales.  Lymphadenopathy:    He has no cervical adenopathy.   Uncomfortable appearing, lying on table.  Nursing and vital sign notes reviewed        Assessment & Plan:

## 2011-04-07 NOTE — Assessment & Plan Note (Signed)
PT ed provided on viral URI, and effects of smoking on uri.  Supportive care.  Red flags reviewed.

## 2011-04-07 NOTE — Assessment & Plan Note (Signed)
Advised cessation.  Willing to meet with Dr. Raymondo Band to discuss

## 2011-04-28 ENCOUNTER — Ambulatory Visit: Payer: BC Managed Care – PPO | Admitting: Pharmacist

## 2011-05-11 ENCOUNTER — Other Ambulatory Visit: Payer: Self-pay | Admitting: Family Medicine

## 2011-05-12 NOTE — Telephone Encounter (Signed)
Refill request

## 2011-06-01 ENCOUNTER — Other Ambulatory Visit: Payer: Self-pay | Admitting: Family Medicine

## 2011-06-02 NOTE — Telephone Encounter (Signed)
Refill request

## 2011-06-30 ENCOUNTER — Other Ambulatory Visit: Payer: Self-pay | Admitting: Family Medicine

## 2011-06-30 NOTE — Telephone Encounter (Signed)
Will refill #10 but pt needs appointment with me for follow up as Dr. Denyse Amass request 4 months ago.

## 2011-06-30 NOTE — Telephone Encounter (Signed)
Refill request

## 2011-08-01 ENCOUNTER — Other Ambulatory Visit: Payer: Self-pay | Admitting: Family Medicine

## 2011-08-02 ENCOUNTER — Other Ambulatory Visit: Payer: Self-pay | Admitting: Family Medicine

## 2011-08-02 NOTE — Telephone Encounter (Signed)
Refill request

## 2011-08-02 NOTE — Telephone Encounter (Signed)
Patient MAY NOT have 90 day supply.  Will only be given #30 until seen by PCP as requested 5 months ago.

## 2011-08-02 NOTE — Telephone Encounter (Signed)
Will refill x1 month. NO FURTHER REFILLS ON ANY MEDS WITHOUT APPT WITH ME.

## 2011-08-14 ENCOUNTER — Encounter: Payer: Self-pay | Admitting: Family Medicine

## 2011-08-14 ENCOUNTER — Encounter (HOSPITAL_COMMUNITY): Payer: Self-pay | Admitting: General Practice

## 2011-08-14 ENCOUNTER — Observation Stay (HOSPITAL_COMMUNITY)
Admission: AD | Admit: 2011-08-14 | Discharge: 2011-08-17 | DRG: 183 | Disposition: A | Discharge status: Home / Self Care | Payer: BC Managed Care – PPO | Source: Ambulatory Visit | Attending: Family Medicine | Admitting: Family Medicine

## 2011-08-14 ENCOUNTER — Ambulatory Visit (INDEPENDENT_AMBULATORY_CARE_PROVIDER_SITE_OTHER): Payer: BC Managed Care – PPO | Admitting: Family Medicine

## 2011-08-14 ENCOUNTER — Inpatient Hospital Stay (HOSPITAL_COMMUNITY): Payer: BC Managed Care – PPO

## 2011-08-14 DIAGNOSIS — F3289 Other specified depressive episodes: Secondary | ICD-10-CM | POA: Insufficient documentation

## 2011-08-14 DIAGNOSIS — I10 Essential (primary) hypertension: Secondary | ICD-10-CM

## 2011-08-14 DIAGNOSIS — G8929 Other chronic pain: Secondary | ICD-10-CM | POA: Insufficient documentation

## 2011-08-14 DIAGNOSIS — K2970 Gastritis, unspecified, without bleeding: Secondary | ICD-10-CM

## 2011-08-14 DIAGNOSIS — K589 Irritable bowel syndrome without diarrhea: Principal | ICD-10-CM

## 2011-08-14 DIAGNOSIS — M25569 Pain in unspecified knee: Secondary | ICD-10-CM | POA: Insufficient documentation

## 2011-08-14 DIAGNOSIS — F17200 Nicotine dependence, unspecified, uncomplicated: Secondary | ICD-10-CM

## 2011-08-14 DIAGNOSIS — R1013 Epigastric pain: Secondary | ICD-10-CM | POA: Insufficient documentation

## 2011-08-14 DIAGNOSIS — R109 Unspecified abdominal pain: Secondary | ICD-10-CM

## 2011-08-14 DIAGNOSIS — G4700 Insomnia, unspecified: Secondary | ICD-10-CM | POA: Insufficient documentation

## 2011-08-14 DIAGNOSIS — F329 Major depressive disorder, single episode, unspecified: Secondary | ICD-10-CM | POA: Insufficient documentation

## 2011-08-14 DIAGNOSIS — F418 Other specified anxiety disorders: Secondary | ICD-10-CM | POA: Insufficient documentation

## 2011-08-14 HISTORY — DX: Pneumonia, unspecified organism: J18.9

## 2011-08-14 HISTORY — DX: Helicobacter pylori (H. pylori) as the cause of diseases classified elsewhere: B96.81

## 2011-08-14 HISTORY — DX: Gastric ulcer, unspecified as acute or chronic, without hemorrhage or perforation: K25.9

## 2011-08-14 HISTORY — DX: Nonspecific mesenteric lymphadenitis: I88.0

## 2011-08-14 LAB — COMPREHENSIVE METABOLIC PANEL
Albumin: 4 g/dL (ref 3.5–5.2)
Alkaline Phosphatase: 120 U/L — ABNORMAL HIGH (ref 39–117)
BUN: 9 mg/dL (ref 6–23)
CO2: 26 mEq/L (ref 19–32)
Chloride: 103 mEq/L (ref 96–112)
GFR calc Af Amer: >90 mL/min (ref >90)
Glucose, Bld: 104 mg/dL — ABNORMAL HIGH (ref 70–99)
Potassium: 3.7 mEq/L (ref 3.5–5.1)
Total Bilirubin: 0.4 mg/dL (ref 0.3–1.2)

## 2011-08-14 LAB — CBC
HCT: 43.3 % (ref 39.0–52.0)
Hemoglobin: 15.2 g/dL (ref 13.0–17.0)
RBC: 4.85 MIL/uL (ref 4.22–5.81)
WBC: 7.1 10*3/uL (ref 4.0–10.5)

## 2011-08-14 LAB — DIFFERENTIAL
Basophils Relative: 1 % (ref 0–1)
Eosinophils Absolute: 0.4 10*3/uL (ref 0.0–0.7)
Monocytes Relative: 9 % (ref 3–12)
Neutrophils Relative %: 66 % (ref 43–77)

## 2011-08-14 MED ORDER — SODIUM CHLORIDE 0.9 % IV SOLN
INTRAVENOUS | Status: DC
  Administered 2011-08-14: 17:00:00 via INTRAVENOUS
  Administered 2011-08-15 (×2): 1000 mL via INTRAVENOUS
  Administered 2011-08-15 – 2011-08-16 (×2): via INTRAVENOUS

## 2011-08-14 MED ORDER — HYDROMORPHONE HCL PF 1 MG/ML IJ SOLN
0.5000 mg | INTRAMUSCULAR | Status: DC | PRN
  Administered 2011-08-14: 0.5 mg via INTRAVENOUS
  Administered 2011-08-14: 19:00:00 via INTRAVENOUS
  Filled 2011-08-14 (×2): qty 1

## 2011-08-14 MED ORDER — HYDROCODONE-ACETAMINOPHEN 5-325 MG PO TABS
1.0000 | ORAL_TABLET | ORAL | Status: DC | PRN
  Administered 2011-08-15 – 2011-08-17 (×9): 2 via ORAL
  Filled 2011-08-14 (×10): qty 2

## 2011-08-14 MED ORDER — HEPARIN SODIUM (PORCINE) 5000 UNIT/ML IJ SOLN
5000.0000 [IU] | Freq: Three times a day (TID) | INTRAMUSCULAR | Status: DC
  Administered 2011-08-14 – 2011-08-17 (×9): 5000 [IU] via SUBCUTANEOUS
  Filled 2011-08-14 (×11): qty 1

## 2011-08-14 MED ORDER — IOHEXOL 300 MG/ML  SOLN
100.0000 mL | Freq: Once | INTRAMUSCULAR | Status: AC | PRN
  Administered 2011-08-14: 100 mL via INTRAVENOUS

## 2011-08-14 MED ORDER — IOHEXOL 300 MG/ML  SOLN
20.0000 mL | INTRAMUSCULAR | Status: AC
  Administered 2011-08-14 (×2): 20 mL via ORAL

## 2011-08-14 MED ORDER — ONDANSETRON HCL 4 MG/2ML IJ SOLN
4.0000 mg | Freq: Four times a day (QID) | INTRAMUSCULAR | Status: DC | PRN
  Administered 2011-08-15 – 2011-08-16 (×4): 4 mg via INTRAVENOUS
  Filled 2011-08-14 (×4): qty 2

## 2011-08-14 MED ORDER — ONDANSETRON HCL 4 MG PO TABS: 4.0000 mg | ORAL_TABLET | Freq: Four times a day (QID) | ORAL | Status: DC | PRN

## 2011-08-14 MED ORDER — PANTOPRAZOLE SODIUM 40 MG IV SOLR
40.0000 mg | INTRAVENOUS | Status: DC
  Administered 2011-08-14 – 2011-08-15 (×2): 40 mg via INTRAVENOUS
  Filled 2011-08-14 (×3): qty 40

## 2011-08-14 MED ORDER — HYDROMORPHONE HCL PF 1 MG/ML IJ SOLN
1.0000 mg | INTRAMUSCULAR | Status: DC | PRN
  Administered 2011-08-14 – 2011-08-16 (×9): 1 mg via INTRAVENOUS
  Filled 2011-08-14 (×9): qty 1

## 2011-08-14 MED ORDER — ONDANSETRON HCL 4 MG PO TABS: 8.0000 mg | ORAL_TABLET | Freq: Four times a day (QID) | ORAL | Status: DC | PRN

## 2011-08-14 MED ORDER — ONDANSETRON HCL 4 MG/2ML IJ SOLN
4.0000 mg | Freq: Four times a day (QID) | INTRAMUSCULAR | Status: DC | PRN
  Administered 2011-08-14: 4 mg via INTRAVENOUS
  Filled 2011-08-14: qty 2

## 2011-08-14 NOTE — H&P (Signed)
Tony [name] is an 46 y.o. male.   Chief Complaint:  2 days of abdominal pain HPI:  Pain is worst in lower abdomen, both left and right, moderate to severe.  Worse with food, more with acid or spicy.  Has a bowel movement daily, notes it is watery for years.  No blood, no mucous.  Today started having emesis, yellow, no blood.     PMH sig for istory of chronic abdominal pain and constipation.  Admission in 2008 and 2011 for similar episodes. In 2008 diagnosed gastric ulcer due to h pylori gastritis with a normal EGD and colonoscopy.  In 2011 was admitted and found to have pneumonia and was presumed diagnosis mesenteric adenitis as the reason for his abdominal pain.  Review of SystemsGen: + chills,  No fever,  unexplained weight loss Ears:  No hearing loss Eyes: No vision changes Nose:  + rhinorrhea, No , congestion Throat:  No sore throat or dysphagia CV:  No chest pain, palpitations, PND, dyspnea on exertion, or edema Resp: No cough, dyspnea, wheezing Abd:  Positive for nausea, vomting, diarrhea, constipation Neuro:  No headache, numbness, weakness, tingling, syncope.       Objective:   Physical Exam  GEN: Alert & Oriented, accompanied by his wife, writhing on the bed, moaning in pain. CV:  Regular Rate & Rhythm, no murmur Respiratory:  Normal work of breathing, CTAB Abd:  Decreased bowel sounds, moderately tender to palpation in right and left lower quadrants, no rebound or guarding Ext: no pre-tibial edema   Past Medical History  Diagnosis Date  . Hypertension   . GERD (gastroesophageal reflux disease)     History reviewed. No pertinent past surgical history.  Family History  Problem Relation Age of Onset  . Hypertension Mother   . Hypertension Father   . Heart disease Father     CAD  . Diabetes Neg Hx   . Stroke Neg Hx    Social History:  reports that he has been smoking Cigarettes.  He has been smoking about 1 pack per day. He does not have any smokeless  tobacco history on file. He reports that he drinks about 3.5 ounces of alcohol per week. He reports that he does not use illicit drugs.  Allergies:  Allergies  Allergen Reactions  . Morphine And Related     hallucinations    Medications Prior to Admission  Medication Sig Dispense Refill  . metoprolol succinate (TOPROL-XL) 100 MG 24 hr tablet TAKE ONE TABLET BY MOUTH DAILY FOR HIGH BLOOD PRESSURE  90 tablet  0  . ondansetron (ZOFRAN ODT) 8 MG disintegrating tablet 8 mg. 1 tab on tongue every 8 hours as needed for nausea and vomiting       . promethazine (PHENERGAN) 12.5 MG tablet TAKE 1 TABLET BY MOUTH EVERY 6 HOURS AS NEEDED FOR NAUSEA  10 tablet  0  . pantoprazole (PROTONIX) 40 MG tablet Take 40 mg by mouth 2 (two) times daily.        . polyethylene glycol (MIRALAX) powder Take 17 g by mouth daily. Mix 1 capful in 8 oz of fluid daily as needed for constipation. Dispense generic, 1 large bottle        No current facility-administered medications on file as of 08/14/2011.    No results found for this or any previous visit (from the past 48 hour(s)). No results found.  ROS  Blood pressure 190/100, pulse 76, temperature 98.7 F (37.1 C). Physical Exam   Assessment/Plan  46 yo with chronic intermittent abdominal pain here with acute flare x 2 days  1.  Abdominal Pain: acute on chronic abdominal pain, several years ago characterized as gastritis, had neg EGD and colonoscopy at that time.  2 years ago was admitted for similar without clear diagnosis.   Unable to keep down PO and severe pain but without acute abdomen.  Will order CT abdomen/pelvis, CBC with diff, CMET, and lipase to help evaluate etiologies to include gastric.duodenal ulcer, diverticulitis.  Zofran, vicodin, dilaudid and NPI for now for symptoms relief and IV protonix.  2.  Hypertension:  Elevated, not able to keep down meds at this time and actively vomiting.  Will monitor and escalate therapy as indicated.  Continue home  meds- metoprolol  3.  Chronic pain:  Takes vicodin from orthopedist for chronic knee pain. Unclear of dose.  4.  FEN/GI: NPO, will advance as tolerated, NS @ 125 cc/hr.  5. Dispo:  pendign clinical improvement  Reathel Turi 08/14/2011, 3:20 PM

## 2011-08-14 NOTE — Progress Notes (Signed)
Patient doubled over complaining of abdominal pain.  Nausea and has dry heaves at present

## 2011-08-14 NOTE — Patient Instructions (Signed)
Go to admitting on 1st floor  They will get you checked into hospital

## 2011-08-14 NOTE — Progress Notes (Signed)
  Subjective:    Patient ID: Tony [name], male    DOB: 1966-01-05, 46 y.o.   MRN: [id number]  HPI CC:  2 days of abdominal pain    Has a bowel movement daily, notes it is watery for years.  No blood, no mucous. Today starting emesis, yellow, no blood.    Pain is worst in lower abdomen, both left and right.  Worse with food, more with acid or spicy.  PMH sig for istory of chronic abdominal pain and constipation.  Admission in 2008 and 2011 for similar episodes. In 2008 diagnosed gastric ulcer due to h pylori gastritis with a normal EGD and colonoscopy.  In 2011 was admitted and found to have pneumonia and was presumed diagnosis mesenteric adenitis as the reason for his abdominal pain.  Review of SystemsGen: + chills,  No fever,  unexplained weight loss Ears:  No hearing loss Eyes: No vision changes Nose:  + rhinorrhea, No , congestion Throat:  No sore throat or dysphagia CV:  No chest pain, palpitations, PND, dyspnea on exertion, or edema Resp: No cough, dyspnea, wheezing Abd:  Positive for nausea, vomting, diarrhea, constipation Neuro:  No headache, numbness, weakness, tingling, syncope.       Objective:   Physical Exam  GEN: Alert & Oriented, accompanied by his wife, writhing on the bed, moaning in pain. CV:  Regular Rate & Rhythm, no murmur Respiratory:  Normal work of breathing, CTAB Abd:  Decreased bowel sounds, moderately tender to palpation in right and left lower quadrants, no rebound or guarding Ext: no pre-tibial edema       Assessment & Plan:

## 2011-08-15 LAB — OCCULT BLOOD X 1 CARD TO LAB, STOOL: Fecal Occult Bld: NEGATIVE

## 2011-08-15 MED ORDER — POLYETHYLENE GLYCOL 3350 17 G PO PACK
17.0000 g | PACK | Freq: Two times a day (BID) | ORAL | Status: DC | PRN
  Administered 2011-08-15: 17 g via ORAL

## 2011-08-15 MED ORDER — METOPROLOL SUCCINATE ER 100 MG PO TB24
100.0000 mg | ORAL_TABLET | Freq: Every day | ORAL | Status: DC
  Administered 2011-08-15 – 2011-08-16 (×2): 100 mg via ORAL
  Filled 2011-08-15 (×3): qty 1

## 2011-08-15 MED ORDER — DICYCLOMINE HCL 10 MG PO CAPS
10.0000 mg | ORAL_CAPSULE | Freq: Three times a day (TID) | ORAL | Status: DC
  Administered 2011-08-15 – 2011-08-16 (×5): 10 mg via ORAL
  Filled 2011-08-15 (×8): qty 1

## 2011-08-15 NOTE — Progress Notes (Signed)
[facility] Attending Daily Note: Nino Amano MD [phone] pager office [phone] I  have seen and examined this patient, reviewed their chart. I have discussed this patient with the resident. I agree with the resident's findings, assessment and care plan. 

## 2011-08-15 NOTE — Progress Notes (Addendum)
INITIAL ADULT NUTRITION ASSESSMENT Date: 08/15/2011   Time: 12:02 PM  Reason for Assessment: Nutrition Risk Report  ASSESSMENT: Male 46 y.o.  Dx: abdominal pain: acute on chronic  Hx:  Past Medical History  Diagnosis Date  . Hypertension   . GERD (gastroesophageal reflux disease)   . Gastric ulcer due to Helicobacter pylori 2008  . Pneumonia 2011  . Mesenteric adenitis 2011    presumed/H&P    Related Meds:     . dicyclomine  10 mg Oral TID AC & HS  . heparin  5,000 Units Subcutaneous Q8H  . iohexol  20 mL Oral Q1 Hr x 2  . pantoprazole (PROTONIX) IV  40 mg Intravenous Q24H    Ht: 183 cm (6'0)  Wt: 185 lb (84 kg)  Ideal Wt: 81 kg % Ideal Wt: 104%  Usual Wt: ~ 195 lb % Usual Wt: 95%  BMI = 25.1 kg/m2  Food/Nutrition Related Hx: unintentional weight loss per admission nutrition screen  Labs:  CMP     Component Value Date/Time   NA 139 08/14/2011 1615   K 3.7 08/14/2011 1615   CL 103 08/14/2011 1615   CO2 26 08/14/2011 1615   GLUCOSE 104* 08/14/2011 1615   BUN 9 08/14/2011 1615   CREATININE 1.09 08/14/2011 1615   CALCIUM 9.7 08/14/2011 1615   PROT 7.2 08/14/2011 1615   ALBUMIN 4.0 08/14/2011 1615   AST 27 08/14/2011 1615   ALT 61* 08/14/2011 1615   ALKPHOS 120* 08/14/2011 1615   BILITOT 0.4 08/14/2011 1615   GFRNONAA 82* 08/14/2011 1615   GFRAA >90 08/14/2011 1615    I/O last 3 completed shifts: In: 60.4 [I.V.:60.4] Out: -      Diet Order: NPO  Supplements/Tube Feeding: N/A  IVF:    sodium chloride Last Rate: 1,000 mL (08/15/11 0923)    Estimated Nutritional Needs:   Kcal: 2100-2300 Protein: 110-120 gm Fluid: 2.1-2.3 L  RD spoke with pt's wife via telephone re: nutrition hx -- reports pt had a poor appetite PTA -- was consuming < 50% of estimated needs given skipping meals, N/V, abdominal pain and loose stools; also reports pt has had a 10 lb weight loss x 3 month period; suspect some level of malnutrition; would benefit from supplemental addition once able -- pt  amenable.   NUTRITION DIAGNOSIS: -Inadequate oral intake (NI-2.1).  Status: Ongoing  RELATED TO: inability to eat  AS EVIDENCE BY: NPO status  MONITORING/EVALUATION(Goals): Goal: meet >90% of estimated nutrition needs to prevent further weight loss Monitor: PO intake, weight, labs, I/O's  EDUCATION NEEDS: -No education needs identified at this time  INTERVENTION:  Add Nurse, adult supplement once/as able (250 kcals, 9 gm protein per 8 fl oz carton)  RD to follow for nutrition care plan  Dietitian #: [phone]  DOCUMENTATION CODES Per approved criteria  -Not Applicable    Tony [name] 08/15/2011, 12:02 PM

## 2011-08-15 NOTE — Progress Notes (Signed)
Family Medicine Teaching Service Daily Resident Note   Patient name: Tony [name] Medical record number: [id number] Date of birth: [birth year] Age: 46 y.o. Gender: male Length of Stay:  LOS: 1 day             SUBJECTIVE & OVERNIGHT EVENTS  Pt with persistent  Paroxysmal abdominal pain.  Reports that he has been under an increased amount of stress the past couple of weeks because his son was arrested.  + hx for intermittent constipation and diarrhea             OBJECTIVE  Vitals: Patient Vitals for the past 24 hrs:  BP Temp Temp src Pulse Resp SpO2  08/15/11 0543 162/91 mmHg 98.1 F (36.7 C) - 55  18  97 %  08/14/11 2112 148/83 mmHg 98.6 F (37 C) Oral 60  18  98 %  08/14/11 1550 185/96 mmHg 99.8 F (37.7 C) Axillary 69  18  99 %   Wt Readings from Last 3 Encounters:  04/04/11 185 lb 6.4 oz (84.097 kg)  03/03/11 191 lb (86.637 kg)  03/21/10 185 lb 1 oz (83.944 kg)    Intake/Output Summary (Last 24 hours) at 08/15/11 1020 Last data filed at 08/15/11 0700  Gross per 24 hour  Intake  60.42 ml  Output      0 ml  Net  60.42 ml    PE: GENERAL:  Adult AA male, in moderate discomfort; no resp distress; alert and appropriate HNEENT: AT/[place], MMM, no scleral icterus, EOMi THORAX: HEART: RRR, S1/S2 heard, no murmur LUNGS: CTA B, no wheezes, no crackles ABDOMEN:  +BS, soft, no rigidity, no guarding, no masses/organomegaly, Tender over epigastric area and R hemiabdomen EXTREMITIES: Moves all 4 extremities spontaneously, warm well perfused, no edema, bilateral DP and PT pulses 2/4.   >PSYCH: mood congruent with situation; reports increased stress LABS: Hematology:  Lab 08/14/11 1615  WBC 7.1  HGB 15.2  HCT 43.3  PLT 270  RDW 12.3  MCV 89.3  MCHC 35.1    Lab 08/14/11 1615  LYMPHSABS 1.4  EOSABS 0.4  BASOSABS 0.0   No results found for this basename: MRET:3,MRETPCT:3 in the last 168 hours No results found for this basename: IRON,TIBC,FERRITIN in the last 168  hours Metabolic:  Lab 08/14/11 1615  NA 139  K 3.7  CL 103  CO2 26  BUN 9  CREATININE 1.09  GLUCOSE 104*  CALCIUM 9.7  MG --  PHOS --    Lab 08/14/11 1615  AST 27  ALKPHOS 120*  BILITOT 0.4  BILIDIR --  PROT 7.2  ALBUMIN 4.0  PREALBUMIN --     Lab 08/14/11 1615  AMYLASE --  LIPASE 41    IMAGING: 3/4 - CT Abdomen/Pelvis: no acute findings   MEDS:    . dicyclomine  10 mg Oral TID AC & HS  . heparin  5,000 Units Subcutaneous Q8H  . iohexol  20 mL Oral Q1 Hr x 2  . pantoprazole (PROTONIX) IV  40 mg Intravenous Q24H   HYDROcodone-acetaminophen, HYDROmorphone, iohexol, ondansetron (ZOFRAN) IV, ondansetron, polyethylene glycol, DISCONTD: HYDROmorphone, DISCONTD: ondansetron (ZOFRAN) IV, DISCONTD: ondansetron            ASSESSMENT & PLAN  46 yo with chronic intermittent abdominal pain here with acute flare x 2 days  1. Abdominal Pain:  Improved with IV pain medications CT non-revealing Concern for IBS - will check Hemocult and Fecal Lactoferrin; will give soap suds enema, daily miralax and bentyl Transition  to po pain meds Will advance diet  2. Hypertension: slightly elevated - restarted metoprolol  3. Chronic pain: Takes vicodin from orthopedist for chronic knee pain. Unclear of dose - will need to discuss pain   4. FEN/GI: NPO, will advance as tolerated, NS @ 125 cc/hr.   --- FEN/GI: NPO except meds --- IVFs: NS @ 121ml/hr  --- PPx: PPI + heparin            DISPOSITION  Consider d/c to home after enema if improvement in sx with enema and bentyl  Gaspar Bidding, DO [facility] Resident - PGY-1 08/15/2011 10:20 AM

## 2011-08-15 NOTE — Progress Notes (Signed)
08-15-11 UR completed. Ronny Flurry RN BSN

## 2011-08-16 LAB — BASIC METABOLIC PANEL
BUN: 14 mg/dL (ref 6–23)
CO2: 25 mEq/L (ref 19–32)
Chloride: 98 mEq/L (ref 96–112)
Creatinine, Ser: 1.09 mg/dL (ref 0.50–1.35)

## 2011-08-16 LAB — CBC
HCT: 38.9 % — ABNORMAL LOW (ref 39.0–52.0)
Hemoglobin: 13.6 g/dL (ref 13.0–17.0)
MCHC: 35 g/dL (ref 30.0–36.0)
MCV: 89 fL (ref 78.0–100.0)

## 2011-08-16 LAB — LIPID PANEL
Cholesterol: 162 mg/dL (ref 0–200)
HDL: 34 mg/dL — ABNORMAL LOW (ref >39)
LDL Cholesterol: 108 mg/dL — ABNORMAL HIGH (ref 0–99)

## 2011-08-16 LAB — DIFFERENTIAL
Basophils Relative: 0 % (ref 0–1)
Lymphocytes Relative: 20 % (ref 12–46)
Monocytes Absolute: 0.7 10*3/uL (ref 0.1–1.0)
Monocytes Relative: 13 % — ABNORMAL HIGH (ref 3–12)
Neutro Abs: 3.7 10*3/uL (ref 1.7–7.7)

## 2011-08-16 LAB — FECAL LACTOFERRIN, QUANT

## 2011-08-16 MED ORDER — BELLADONNA ALK-PHENOBARBITAL 16.2 MG/5ML PO ELIX
5.0000 mL | ORAL_SOLUTION | Freq: Four times a day (QID) | ORAL | Status: DC | PRN
  Administered 2011-08-16 – 2011-08-17 (×2): 16.2 mg via ORAL
  Filled 2011-08-16 (×3): qty 5

## 2011-08-16 MED ORDER — BOOST / RESOURCE BREEZE PO LIQD
1.0000 | Freq: Three times a day (TID) | ORAL | Status: DC
  Administered 2011-08-16 – 2011-08-17 (×2): 1 via ORAL

## 2011-08-16 MED ORDER — PROMETHAZINE HCL 12.5 MG PO TABS
12.5000 mg | ORAL_TABLET | Freq: Four times a day (QID) | ORAL | Status: DC | PRN
  Administered 2011-08-17: 12.5 mg via ORAL
  Filled 2011-08-16: qty 1

## 2011-08-16 MED ORDER — POLYETHYLENE GLYCOL 3350 17 G PO PACK
17.0000 g | PACK | Freq: Every day | ORAL | Status: DC
  Administered 2011-08-16: 17 g via ORAL
  Filled 2011-08-16 (×3): qty 1

## 2011-08-16 MED ORDER — PANTOPRAZOLE SODIUM 40 MG PO TBEC
40.0000 mg | DELAYED_RELEASE_TABLET | Freq: Two times a day (BID) | ORAL | Status: DC
  Administered 2011-08-16 – 2011-08-17 (×2): 40 mg via ORAL
  Filled 2011-08-16 (×2): qty 1

## 2011-08-16 MED ORDER — BELLADONNA ALK-PHENOBARBITAL 16.2 MG PO TABS
1.0000 | ORAL_TABLET | Freq: Four times a day (QID) | ORAL | Status: DC | PRN
  Filled 2011-08-16: qty 1

## 2011-08-16 MED ORDER — ONDANSETRON 8 MG/NS 50 ML IVPB
8.0000 mg | Freq: Four times a day (QID) | INTRAVENOUS | Status: DC | PRN
  Administered 2011-08-16: 8 mg via INTRAVENOUS
  Filled 2011-08-16 (×2): qty 8

## 2011-08-16 MED ORDER — SODIUM CHLORIDE 0.9 % IV SOLN
INTRAVENOUS | Status: DC
  Administered 2011-08-16 – 2011-08-17 (×2): via INTRAVENOUS

## 2011-08-16 MED ORDER — PANTOPRAZOLE SODIUM 40 MG PO TBEC: 40.0000 mg | DELAYED_RELEASE_TABLET | Freq: Every day | ORAL | Status: DC

## 2011-08-16 MED ORDER — ONDANSETRON HCL 4 MG PO TABS
8.0000 mg | ORAL_TABLET | Freq: Four times a day (QID) | ORAL | Status: DC | PRN
  Administered 2011-08-17: 8 mg via ORAL
  Filled 2011-08-16: qty 2

## 2011-08-16 NOTE — Progress Notes (Signed)
FMTS Attending Daily Note: Clarissia Mckeen MD [phone] pager office [phone] I  have seen and examined this patient, reviewed their chart. I have discussed this patient with the resident. I agree with the resident's findings, assessment and care plan. 

## 2011-08-16 NOTE — Progress Notes (Signed)
Changed protonix from IV -> po per P&T policy.  Thanks,  Marchelle Folks. Illene Bolus, PharmD, BCPS Clinical Pharmacist Pager: [phone] 08/16/2011 1:50 PM

## 2011-08-16 NOTE — Progress Notes (Signed)
PCP NOTE  I have seen and examined my primary patient and greatly appreciate the excellent care being provided by the FPTS team.  Patient reports that he is still having abdominal pain.  Norco helps relieve the pain but "it takes a long time to work."  In addition, he reports that he has not had a bowel movement despite the enema, and says that the enema actually made his abdominal pain worse.  We discussed using narcotics with caution since they may lead to worsening constipation and abdominal pain.  Patient reports that he was told he is leaving today at noon and, despite still having pain, would like to leave.  Britiany Silbernagel 08/16/2011, 12:03 PM

## 2011-08-16 NOTE — Progress Notes (Signed)
Family Medicine Teaching Service Daily Resident Note   Patient name: Tony [name] Medical record number: [id number] Date of birth: Apr 14, 1966 Age: 46 y.o. Gender: male Length of Stay:  LOS: 2 days             SUBJECTIVE & OVERNIGHT EVENTS  Pt with worsening of abdominal pain early this AM.  He states that he thinks he may have a stomach virus. Continues to have severe vomiting/retching with severe diffuse abdominal pain.  Reports that pain is not necessarily better with Dilaudid.  Upon exam this AM reports pain is 0/10 but when paroxysmal pain can be up to 8/10.               OBJECTIVE  Vitals: Patient Vitals for the past 24 hrs:  BP Temp Pulse Resp SpO2  08/16/11 1300 179/86 mmHg 100.2 F (37.9 C) 59  18  99 %  08/16/11 0615 159/87 mmHg 97.9 F (36.6 C) 70  20  96 %  08/15/11 2237 149/80 mmHg - 62  - -  08/15/11 2231 164/89 mmHg 98 F (36.7 C) 64  19  94 %   Wt Readings from Last 3 Encounters:  04/04/11 185 lb 6.4 oz (84.097 kg)  03/03/11 191 lb (86.637 kg)  03/21/10 185 lb 1 oz (83.944 kg)    Intake/Output Summary (Last 24 hours) at 08/16/11 1424 Last data filed at 08/16/11 0700  Gross per 24 hour  Intake   1500 ml  Output    500 ml  Net   1000 ml    PE: GENERAL: Adult AA male, in moderate discomfort; no resp distress; alert and appropriate  HNEENT: AT/[place], MMM, no scleral icterus, EOMi  THORAX:  HEART: RRR, S1/S2 heard, no murmur  LUNGS: CTA B, no wheezes, no crackles  ABDOMEN: +BS, soft, no rigidity, no guarding, no masses/organomegaly, Tender over epigastric area and R hemiabdomen; hyperactive bowel sounds; no rebound tenderness, neg murphy's and mcburney's, no signs of peritonitis EXTREMITIES: Moves all 4 extremities spontaneously, warm well perfused, no edema, bilateral DP and PT pulses 2/4.  >PSYCH: mood congruent with situation; reports increased stress LABS: Hematology:  Lab 08/14/11 1615  WBC 7.1  HGB 15.2  HCT 43.3  PLT 270  RDW 12.3  MCV 89.3    MCHC 35.1    Lab 08/14/11 1615  LYMPHSABS 1.4  EOSABS 0.4  BASOSABS 0.0   No results found for this basename: MRET:3,MRETPCT:3 in the last 168 hours No results found for this basename: IRON,TIBC,FERRITIN in the last 168 hours Metabolic:  Lab 08/14/11 1615  NA 139  K 3.7  CL 103  CO2 26  BUN 9  CREATININE 1.09  GLUCOSE 104*  CALCIUM 9.7  MG --  PHOS --    Lab 08/14/11 1615  AST 27  ALKPHOS 120*  BILITOT 0.4  BILIDIR --  PROT 7.2  ALBUMIN 4.0  PREALBUMIN --   No results found for this basename: CHOL,TRIG,HDL,LDL in the last 168 hours No results found for this basename: TSH,T3FREE,FREET4 in the last 168 hours No results found for this basename: GLUCAP:10 in the last 168 hours No results found for this basename: HGBA1C:3 in the last 168 hours Other Labs: No results found for this basename: CKTOTAL:3,TROPONINI:3,TROPONINT:3,CKMBINDEX:3 in the last 168 hours  Lab 08/14/11 1615  AMYLASE --  LIPASE 41   No results found for this basename: PT:3,INR:3,APPT:3 in the last 168 hours No results found for this basename: CRP:3,SEDRATE:3 in the last 168 hours No results found  for this basename: ANA,RF,SMOOTHMUSCAB,MITOAB in the last 168 hours No components found with this basename: LACTICACIDVEN:3,PROCALCITONIN:3 No results found for this basename: PHART:3,PCO2:3,PCO2ART:3,PO2:3,PO2ART:3,HCO3:3,TCO2:3,ACIDBASEDEF:3,O2SAT:3 in the last 168 hours No results found for this basename: URICACID:3 in the last 168 hours No results found for this basename: DDIMER in the last 168 hours No results found for this basename: HIV1X2,HAV,HEPAIGM,HEPBIGM,HEPBCAB,HBEAG,HEPCAB in the last 168 hours Drug Levels: No results found for this basename: LABOPIA,COCAINSCRNUR,LABBENZ,AMPHETMU,THCU,LABBARB in the last 168 hours No results found for this basename: PHENYTOIN:3,PHENOBARB:3,VALPROATE:3,CBMZ:3,LITHIUM:3 in the last 168 hours No results found for this basename: DIGOXIN:3 in the last 168  hours  Hemocult - Negative Fecal Lactoferrin - negative  IMAGING: No new Imaging  MEDS:    . heparin  5,000 Units Subcutaneous Q8H  . metoprolol succinate  100 mg Oral QHS  . pantoprazole  40 mg Oral Q1200  . polyethylene glycol  17 g Oral Daily  . DISCONTD: dicyclomine  10 mg Oral TID AC & HS  . DISCONTD: pantoprazole (PROTONIX) IV  40 mg Intravenous Q24H   belladonna-PHENObarbital, HYDROcodone-acetaminophen, ondansetron (ZOFRAN) IV, ondansetron, DISCONTD: atropine-PHENObarbital-scopolamine-hyoscyamine, DISCONTD: HYDROmorphone, DISCONTD: ondansetron (ZOFRAN) IV, DISCONTD: ondansetron, DISCONTD: polyethylene glycol              ASSESSMENT & PLAN  46 yo with chronic intermittent abdominal pain here with acute flare x 2 days  1. Abdominal Pain (Irritable Bowel Syndrome, ?viral gastroenteritis) 0 at baseline when not experiencing cramping pain, but up to 8/10 at worst, edge taken off by Dilaudid but not significant reduction in pain.  Bentyl subjectively improved symptoms. CT non-revealing; negative  Hemocult and Fecal Lactoferrin;  S/p soap suds enema - minimal BM produced - will start daily miralax  D/c Bentyl and start Donnatal (belladonna alkaloids + phenobarbital/atropine/scopolamine)  Will recheck CBC and BMET because of persistent N/V and ?viral worsening of underlying IBS NPO except meds Zofran 8mg  Will have GI follow up as OP 2. Hypertension: slightly elevated - restarted metoprolol; likely associated with pain - will continue to monitor 3. Chronic pain: Takes vicodin from orthopedist for chronic knee pain - will continue po pain meds prn  --- IVFs: NS @ 123ml/hr  --- FEN/GI: advance diet as tolerated --- PPx: PPI + heparin            DISPOSITION  Was planning on d/c to home today but discussed case with pt and pt's wife and due to his continued pain will try new regimen with Donnatal and monitor overnight.  Restart fluids - advance diet as tolerated  Gaspar Bidding,  DO [facility] Resident - PGY-1 08/16/2011 2:24 PM

## 2011-08-17 LAB — COMPREHENSIVE METABOLIC PANEL
Albumin: 3.3 g/dL — ABNORMAL LOW (ref 3.5–5.2)
BUN: 10 mg/dL (ref 6–23)
Creatinine, Ser: 1.06 mg/dL (ref 0.50–1.35)
Total Protein: 6.1 g/dL (ref 6.0–8.3)

## 2011-08-17 LAB — CBC
HCT: 37.5 % — ABNORMAL LOW (ref 39.0–52.0)
MCHC: 34.1 g/dL (ref 30.0–36.0)
MCV: 90.1 fL (ref 78.0–100.0)
RDW: 12.5 % (ref 11.5–15.5)

## 2011-08-17 MED ORDER — BELLADONNA ALK-PHENOBARBITAL 16.2 MG/5ML PO ELIX
5.0000 mL | ORAL_SOLUTION | Freq: Four times a day (QID) | ORAL | Status: DC
  Filled 2011-08-17 (×2): qty 5

## 2011-08-17 MED ORDER — BELLADONNA ALK-PHENOBARBITAL 16.2 MG/5ML PO ELIX
5.0000 mL | ORAL_SOLUTION | Freq: Four times a day (QID) | ORAL | Status: DC
  Administered 2011-08-17: 16.2 mg via ORAL
  Filled 2011-08-17 (×3): qty 5

## 2011-08-17 MED ORDER — ONDANSETRON 8 MG PO TBDP: 8.0000 mg | ORAL_TABLET | Freq: Three times a day (TID) | ORAL | Status: DC | PRN

## 2011-08-17 MED ORDER — BELLADONNA ALK-PHENOBARBITAL 16.2 MG PO TABS: 1.0000 | ORAL_TABLET | Freq: Four times a day (QID) | ORAL | Status: DC | PRN

## 2011-08-17 MED ORDER — POLYETHYLENE GLYCOL 3350 17 G PO PACK
34.0000 g | PACK | Freq: Two times a day (BID) | ORAL | Status: DC
  Administered 2011-08-17: 34 g via ORAL
  Filled 2011-08-17: qty 1
  Filled 2011-08-17: qty 2

## 2011-08-17 MED ORDER — POLYETHYLENE GLYCOL 3350 17 GM/SCOOP PO POWD: 17.0000 g | Freq: Two times a day (BID) | ORAL | Status: DC

## 2011-08-17 NOTE — Progress Notes (Signed)
Family Medicine Teaching Service Daily Resident Note   Patient name: Tony [name] Medical record number: [id number] Date of birth: 08/20/1965 Age: 46 y.o. Gender: male Length of Stay:  LOS: 3 days             SUBJECTIVE & OVERNIGHT EVENTS  Pt with again worsening of abdominal pain early this AM.  He states that he thinks he may have a stomach virus. Grossly unchanged from yesterday but decreased frequency of episodes with Donnatal.  Upon exam this AM reports pain is 0/10 but when paroxysmal pain can be up to 8/10.  Pt reports this is what his symptoms are typically like then pass after a couple of days.               OBJECTIVE  Vitals: Patient Vitals for the past 24 hrs:  BP Temp Pulse Resp SpO2  08/17/11 0657 144/72 mmHg 98.9 F (37.2 C) 66  18  98 %  08/16/11 2212 135/76 mmHg 97.8 F (36.6 C) 60  16  96 %  08/16/11 1300 179/86 mmHg 100.2 F (37.9 C) 59  18  99 %   Wt Readings from Last 3 Encounters:  04/04/11 185 lb 6.4 oz (84.097 kg)  03/03/11 191 lb (86.637 kg)  03/21/10 185 lb 1 oz (83.944 kg)    Intake/Output Summary (Last 24 hours) at 08/17/11 1242 Last data filed at 08/17/11 0700  Gross per 24 hour  Intake   2520 ml  Output      0 ml  Net   2520 ml    PE: GENERAL: Adult AA male, in moderate discomfort; no resp distress; alert and appropriate  HNEENT: AT/[place], MMM, no scleral icterus, EOMi  THORAX:  HEART: RRR, S1/S2 heard, no murmur  LUNGS: CTA B, no wheezes, no crackles  ABDOMEN: +BS, soft, no rigidity, no guarding, no masses/organomegaly, Tender over epigastric area and R hemiabdomen; hyperactive bowel sounds; no rebound tenderness, neg murphy's and mcburney's, no signs of peritonitis EXTREMITIES: Moves all 4 extremities spontaneously, warm well perfused, no edema, bilateral DP and PT pulses 2/4.  >PSYCH: mood congruent with situation; reports increased stress LABS: Hematology:  Lab 08/17/11 0630 08/16/11 1514 08/14/11 1615  WBC 5.3 5.6 7.1  HGB 12.8*  13.6 15.2  HCT 37.5* 38.9* 43.3  PLT 211 227 270  RDW 12.5 12.3 12.3  MCV 90.1 89.0 89.3  MCHC 34.1 35.0 35.1    Lab 08/16/11 1514 08/14/11 1615  LYMPHSABS 1.1 1.4  EOSABS 0.1 0.4  BASOSABS 0.0 0.0   No results found for this basename: MRET:3,MRETPCT:3 in the last 168 hours No results found for this basename: IRON,TIBC,FERRITIN in the last 168 hours Metabolic:  Lab 08/17/11 0630 08/16/11 1514 08/14/11 1615  NA 137 135 139  K 3.9 3.7 3.7  CL 106 98 103  CO2 23 25 26   BUN 10 14 9   CREATININE 1.06 1.09 1.09  GLUCOSE 83 83 104*  CALCIUM 8.5 9.3 9.7  MG -- -- --  PHOS -- -- --    Lab 08/17/11 0630 08/14/11 1615  AST 49* 27  ALKPHOS 88 120*  BILITOT 0.4 0.4  BILIDIR -- --  PROT 6.1 7.2  ALBUMIN 3.3* 4.0  PREALBUMIN -- --    Lab 08/16/11 2125  CHOL 162  TRIG 101  HDL 34*   No results found for this basename: TSH,T3FREE,FREET4 in the last 168 hours No results found for this basename: GLUCAP:10 in the last 168 hours No results found for this  basename: HGBA1C:3 in the last 168 hours Other Labs: No results found for this basename: CKTOTAL:3,TROPONINI:3,TROPONINT:3,CKMBINDEX:3 in the last 168 hours  Lab 08/14/11 1615  AMYLASE --  LIPASE 41   Hemocult - Negative Fecal Lactoferrin - negative  IMAGING: No new Imaging  MEDS:    . belladonna-PHENObarbital  5 mL Oral QID  . feeding supplement  1 Container Oral TID BM  . heparin  5,000 Units Subcutaneous Q8H  . metoprolol succinate  100 mg Oral QHS  . pantoprazole  40 mg Oral BID AC  . polyethylene glycol  34 g Oral BID  . DISCONTD: dicyclomine  10 mg Oral TID AC & HS  . DISCONTD: pantoprazole  40 mg Oral Q1200  . DISCONTD: pantoprazole (PROTONIX) IV  40 mg Intravenous Q24H  . DISCONTD: polyethylene glycol  17 g Oral Daily   HYDROcodone-acetaminophen, ondansetron (ZOFRAN) IV, ondansetron, promethazine, DISCONTD: atropine-PHENObarbital-scopolamine-hyoscyamine, DISCONTD: belladonna-PHENObarbital, DISCONTD:  ondansetron (ZOFRAN) IV, DISCONTD: ondansetron              ASSESSMENT & PLAN  46 yo with chronic intermittent abdominal pain here with acute flare.  Day 3 of hospitalization.   1. Abdominal Pain (Irritable Bowel Syndrome, ?viral gastroenteritis)  *Donnatal (belladonna alkaloids + phenobarbital/atropine/scopolamine)   *Zofran  *Phenergan  *Miralax 0 at baseline when not experiencing cramping pain, but up to 8/10 at worst, edge taken off by Dilaudid but not significant reduction in pain.  Donnatal has reduced frequency but still severe pain with episodes.   CT non-revealing; negative  Hemocult and Fecal Lactoferrin;  S/p soap suds enema - minimal BM produced - daily miralax - CONSIDER REPEAT ENEMA VS BOWEL PREP Will recheck CBC and BMET because of persistent N/V and ?viral worsening of underlying IBS NPO except meds Zofran 8mg  GI (Dr. Marina Goodell with [facility]) CONSULTED TODAY - Appreciate their input  2. Hypertension: stable today - will continue to monitor 3. Chronic pain: Takes vicodin from orthopedist for chronic knee pain - will continue po pain meds prn; Opioids likely contributing to chronic abdominal pain  --- IVFs: NS @ 138ml/hr  --- FEN/GI: advance diet as tolerated --- PPx: PPI + heparin            DISPOSITION  Hopeful for D/c later today pending GI evaluation.  Will need close GI and PCP follow up.     Gaspar Bidding, DO [facility] Resident - PGY-1 08/17/2011 12:42 PM

## 2011-08-17 NOTE — Discharge Summary (Signed)
Family Medicine Teaching Service DISCHARGE SUMMARY   Patient name: Tony [name] Medical record number: [id number] Date of birth: Feb 06, 1966 Age: 46 y.o. Gender: male  Attending Physician: No att. providers found Primary Care Provider: Demetria Pore, MD, MD Consultants: None  Dates of Hospitalization:  08/14/2011 to 08/18/2011 Length of Stay: 3 days  Admission Diagnoses:  Abdominal Pain  Discharge Diagnoses:  Principal Problem:  *IBS (irritable bowel syndrome) Active Problems:  SMOKER  DEPRESSION  ESSENTIAL HYPERTENSION  EPIGASTRIC PAIN, CHRONIC   Brief Hospital Course   Tony [name] is a 46 y.o. year old male who presented to [facility] with  Days of abdominal pain, nausea and persistent vomiting.  His pmhx is significant for chronic abdominal pain with intermittent constipation and diarrhea.  GI had previously evaluated him in 2008 and 2011 and previously had found a gastric ulcer + for H.Pylori on EGD as well as mesenteric adenitis.  He was recommended to follow up with pill endoscopy however had been unable to follow up due to financial reasons.  He was admitted and started on Dilaudid, Zofran, and Bentyl for presumed IBS without significant resolution.  He was transitioned to Zofran, Phenergan and Donnatol with much improvement.  He was also started on daily MiraLax due to his presumed mixed IBS.   On day of discharge GI was to see him, however pt felt better and was anxious to go home.  GI evaluation was deferred to an out patient setting and pt was discharged tolerating fluids with Rxs for Zofran, Phenergan and Donnatol.    The rest of his hospital course was uncomplicated and he was tolerating oral fluids well prior to d/c.  He did have some residual crampy abdominal pain with infrequent nausea and vomiting.     Significant Diagnostics:  LABS: Hematology:  Lab 08/17/11 0630 08/16/11 1514 08/14/11 1615  WBC 5.3 5.6 7.1  HGB 12.8* 13.6 15.2  HCT 37.5* 38.9* 43.3  PLT  211 227 270  RDW 12.5 12.3 12.3  MCV 90.1 89.0 89.3  MCHC 34.1 35.0 35.1    Lab 08/16/11 1514 08/14/11 1615  LYMPHSABS 1.1 1.4  EOSABS 0.1 0.4  BASOSABS 0.0 0.0    Metabolic:  Lab 08/17/11 0630 08/16/11 1514 08/14/11 1615  NA 137 135 139  K 3.9 3.7 3.7  CL 106 98 103  CO2 23 25 26   BUN 10 14 9   CREATININE 1.06 1.09 1.09  GLUCOSE 83 83 104*  CALCIUM 8.5 9.3 9.7  MG -- -- --  PHOS -- -- --    Lab 08/17/11 0630 08/14/11 1615  AST 49* 27  ALKPHOS 88 120*  BILITOT 0.4 0.4  BILIDIR -- --  PROT 6.1 7.2  ALBUMIN 3.3* 4.0  PREALBUMIN -- --    Lab 08/16/11 2125  CHOL 162  TRIG 101  HDL 34*    Lab 08/14/11 1615  AMYLASE --  LIPASE 41   Hemocult - Negative  Fecal Lactoferrin - negative  MICRO None  IMAGING: 3/4 CT Abdomen Pelvis -  IMPRESSION:  Minimal bibasilar atelectasis.  No acute intra abdominal or intrapelvic abnormalities identified.  Specifically, no cause for mid abdominal pain seen.   Procedures:   None   Discharge Information   Disposition: 01-Home or Self Care Discharge Diet: advance diet as tolerated Discharge Condition:  Improved Discharge Activity: Ad lib Medication List  As of 08/18/2011  6:46 PM   TAKE these medications         atropine-PHENObarbital-scopolamine-hyoscyamine 16.2 MG tablet  Commonly known as: DONNATAL   Take 1 tablet by mouth every 6 (six) hours as needed (for abdominal cramping). Take every 6 hours for the next 3-4 days      HYDROcodone-acetaminophen 5-325 MG per tablet   Commonly known as: NORCO   Take 1 tablet by mouth every 6 (six) hours as needed. For knee pain      metoprolol succinate 100 MG 24 hr tablet   Commonly known as: TOPROL-XL   Take 100 mg by mouth at bedtime. Take with or immediately following a meal.      ondansetron 8 MG disintegrating tablet   Commonly known as: ZOFRAN-ODT   Take 1 tablet (8 mg total) by mouth every 8 (eight) hours as needed. 1 tab on tongue every 8 hours as needed for  nausea and vomiting      pantoprazole 40 MG tablet   Commonly known as: PROTONIX   Take 40 mg by mouth 2 (two) times daily.      polyethylene glycol powder powder   Commonly known as: GLYCOLAX/MIRALAX   Take 17 g by mouth 2 (two) times daily. Mix 1 capful in 8 oz of fluid daily as needed for constipation. Dispense generic, 1 large bottle            Follow Up Issues and Recommendations:    Discharge Orders    Future Appointments: Provider: Department: Dept Phone: Center:   08/23/2011 2:15 PM Tito Dine, MD [facility] [phone] [facility]   09/06/2011 2:30 PM Meryl Dare, MD,FACG [facility] Office [phone] [facility]     Future Orders Please Complete By Expires   Diet - low sodium heart healthy      Increase activity slowly        Follow-up Information    Follow up with [facility], MD on 08/23/2011. (215 PM)    Contact information:   [address] [place] [address] [phone]       Schedule an appointment as soon as possible for a visit with  [facility].   Contact information:   [address] [place] [phone]         Pending Results: none   Issues and Medications to address: IBS - will need medications adjusted and ensure compliance. Will need GI follow up for potential repeat EGD/pill endoscopy   Andrena Mews, DO [facility] Resident - PGY-1 08/18/2011 6:46 PM

## 2011-08-17 NOTE — Progress Notes (Signed)
FMTS Attending Daily Note: Shomari Matusik MD [phone] pager office [phone] I  have seen and examined this patient, reviewed their chart. I have discussed this patient with the resident. I agree with the resident's findings, assessment and care plan. 

## 2011-08-18 ENCOUNTER — Encounter: Payer: Self-pay | Admitting: Gastroenterology

## 2011-08-18 DIAGNOSIS — K589 Irritable bowel syndrome without diarrhea: Secondary | ICD-10-CM

## 2011-08-19 ENCOUNTER — Emergency Department (HOSPITAL_COMMUNITY)
Admission: EM | Admit: 2011-08-19 | Discharge: 2011-08-19 | Disposition: A | Discharge status: Home / Self Care | Payer: BC Managed Care – PPO | Attending: Emergency Medicine | Admitting: Emergency Medicine

## 2011-08-19 ENCOUNTER — Encounter (HOSPITAL_COMMUNITY): Payer: Self-pay | Admitting: Emergency Medicine

## 2011-08-19 DIAGNOSIS — Z0389 Encounter for observation for other suspected diseases and conditions ruled out: Secondary | ICD-10-CM | POA: Insufficient documentation

## 2011-08-19 HISTORY — DX: Irritable bowel syndrome, unspecified: K58.9

## 2011-08-19 LAB — BASIC METABOLIC PANEL
CO2: 25 mEq/L (ref 19–32)
Chloride: 102 mEq/L (ref 96–112)
Creatinine, Ser: 1.12 mg/dL (ref 0.50–1.35)
Glucose, Bld: 112 mg/dL — ABNORMAL HIGH (ref 70–99)
Sodium: 138 mEq/L (ref 135–145)

## 2011-08-19 LAB — CBC
HCT: 40.2 % (ref 39.0–52.0)
MCV: 88.7 fL (ref 78.0–100.0)
RBC: 4.53 MIL/uL (ref 4.22–5.81)
RDW: 12.4 % (ref 11.5–15.5)
WBC: 7.9 10*3/uL (ref 4.0–10.5)

## 2011-08-19 LAB — DIFFERENTIAL
Basophils Absolute: 0 10*3/uL (ref 0.0–0.1)
Lymphocytes Relative: 20 % (ref 12–46)
Lymphs Abs: 1.6 10*3/uL (ref 0.7–4.0)
Monocytes Absolute: 0.5 10*3/uL (ref 0.1–1.0)
Neutro Abs: 5.3 10*3/uL (ref 1.7–7.7)

## 2011-08-19 NOTE — ED Notes (Signed)
PT. REPORTS MID/LOW ABDOMINAL PAIN WITH VOMITTING AND DIARRHEA FOR SEVERAL DAYS , STATES HISTORY OF IBS. DENIES FEVER OR CHILLS.

## 2011-08-21 ENCOUNTER — Inpatient Hospital Stay (HOSPITAL_COMMUNITY)
Admission: EM | Admit: 2011-08-21 | Discharge: 2011-08-23 | DRG: 177 | Disposition: A | Discharge status: Home / Self Care | Payer: BC Managed Care – PPO | Attending: Family Medicine | Admitting: Family Medicine

## 2011-08-21 ENCOUNTER — Encounter: Payer: Self-pay | Admitting: Family Medicine

## 2011-08-21 ENCOUNTER — Ambulatory Visit (INDEPENDENT_AMBULATORY_CARE_PROVIDER_SITE_OTHER): Payer: BC Managed Care – PPO | Admitting: Family Medicine

## 2011-08-21 ENCOUNTER — Emergency Department (HOSPITAL_COMMUNITY): Payer: BC Managed Care – PPO

## 2011-08-21 ENCOUNTER — Telehealth: Payer: Self-pay | Admitting: Family Medicine

## 2011-08-21 ENCOUNTER — Encounter (HOSPITAL_COMMUNITY): Payer: Self-pay | Admitting: *Deleted

## 2011-08-21 VITALS — BP 192/100 | HR 77 | Temp 98.1°F | Ht 72.0 in | Wt 168.0 lb

## 2011-08-21 DIAGNOSIS — R109 Unspecified abdominal pain: Secondary | ICD-10-CM

## 2011-08-21 DIAGNOSIS — Z8711 Personal history of peptic ulcer disease: Secondary | ICD-10-CM

## 2011-08-21 DIAGNOSIS — E860 Dehydration: Secondary | ICD-10-CM | POA: Diagnosis present

## 2011-08-21 DIAGNOSIS — I10 Essential (primary) hypertension: Secondary | ICD-10-CM

## 2011-08-21 DIAGNOSIS — K219 Gastro-esophageal reflux disease without esophagitis: Secondary | ICD-10-CM

## 2011-08-21 DIAGNOSIS — K2970 Gastritis, unspecified, without bleeding: Secondary | ICD-10-CM | POA: Diagnosis present

## 2011-08-21 DIAGNOSIS — G8929 Other chronic pain: Secondary | ICD-10-CM | POA: Diagnosis present

## 2011-08-21 DIAGNOSIS — K2990 Gastroduodenitis, unspecified, without bleeding: Secondary | ICD-10-CM | POA: Diagnosis present

## 2011-08-21 DIAGNOSIS — K589 Irritable bowel syndrome without diarrhea: Secondary | ICD-10-CM

## 2011-08-21 DIAGNOSIS — R112 Nausea with vomiting, unspecified: Secondary | ICD-10-CM

## 2011-08-21 DIAGNOSIS — M25569 Pain in unspecified knee: Secondary | ICD-10-CM | POA: Diagnosis present

## 2011-08-21 DIAGNOSIS — K269 Duodenal ulcer, unspecified as acute or chronic, without hemorrhage or perforation: Principal | ICD-10-CM | POA: Diagnosis present

## 2011-08-21 DIAGNOSIS — R1013 Epigastric pain: Secondary | ICD-10-CM

## 2011-08-21 DIAGNOSIS — F17200 Nicotine dependence, unspecified, uncomplicated: Secondary | ICD-10-CM | POA: Diagnosis present

## 2011-08-21 DIAGNOSIS — K2980 Duodenitis without bleeding: Secondary | ICD-10-CM | POA: Diagnosis present

## 2011-08-21 LAB — CBC
MCH: 31.6 pg (ref 26.0–34.0)
Platelets: 246 10*3/uL (ref 150–400)
RBC: 4.81 MIL/uL (ref 4.22–5.81)
WBC: 6.7 10*3/uL (ref 4.0–10.5)

## 2011-08-21 LAB — DIFFERENTIAL
Eosinophils Absolute: 0.2 10*3/uL (ref 0.0–0.7)
Lymphocytes Relative: 37 % (ref 12–46)
Lymphs Abs: 2.5 10*3/uL (ref 0.7–4.0)
Neutrophils Relative %: 53 % (ref 43–77)

## 2011-08-21 LAB — URINALYSIS, ROUTINE W REFLEX MICROSCOPIC
Glucose, UA: NEGATIVE mg/dL
Hgb urine dipstick: NEGATIVE
Protein, ur: 30 mg/dL — AB

## 2011-08-21 LAB — URINE MICROSCOPIC-ADD ON

## 2011-08-21 LAB — COMPREHENSIVE METABOLIC PANEL
ALT: 122 U/L — ABNORMAL HIGH (ref 0–53)
Albumin: 4.2 g/dL (ref 3.5–5.2)
Alkaline Phosphatase: 102 U/L (ref 39–117)
GFR calc Af Amer: 89 mL/min — ABNORMAL LOW (ref >90)
Glucose, Bld: 86 mg/dL (ref 70–99)
Potassium: 3.9 mEq/L (ref 3.5–5.1)
Sodium: 136 mEq/L (ref 135–145)
Total Protein: 7.7 g/dL (ref 6.0–8.3)

## 2011-08-21 LAB — LIPASE, BLOOD: Lipase: 13 U/L (ref 11–59)

## 2011-08-21 MED ORDER — ONDANSETRON HCL 4 MG/2ML IJ SOLN
4.0000 mg | Freq: Once | INTRAMUSCULAR | Status: AC
  Administered 2011-08-21: 4 mg via INTRAVENOUS
  Filled 2011-08-21: qty 2

## 2011-08-21 MED ORDER — LORAZEPAM 2 MG/ML IJ SOLN
0.5000 mg | Freq: Once | INTRAMUSCULAR | Status: AC
  Administered 2011-08-21: 0.5 mg via INTRAMUSCULAR

## 2011-08-21 MED ORDER — PROMETHAZINE HCL 25 MG/ML IJ SOLN
12.5000 mg | INTRAMUSCULAR | Status: AC
  Administered 2011-08-21: 12.5 mg via INTRAVENOUS
  Filled 2011-08-21: qty 1

## 2011-08-21 MED ORDER — FENTANYL CITRATE 0.05 MG/ML IJ SOLN
100.0000 ug | Freq: Once | INTRAMUSCULAR | Status: AC
  Administered 2011-08-21: 100 ug via INTRAVENOUS
  Filled 2011-08-21: qty 2

## 2011-08-21 MED ORDER — PROMETHAZINE HCL 25 MG PO TABS: 25.0000 mg | ORAL_TABLET | Freq: Three times a day (TID) | ORAL | Status: DC | PRN

## 2011-08-21 MED ORDER — MORPHINE SULFATE 4 MG/ML IJ SOLN
4.0000 mg | INTRAMUSCULAR | Status: AC
  Administered 2011-08-21: 4 mg via INTRAVENOUS
  Filled 2011-08-21: qty 1

## 2011-08-21 MED ORDER — SODIUM CHLORIDE 0.9 % IV SOLN
1000.0000 mL | Freq: Once | INTRAVENOUS | Status: AC
  Administered 2011-08-21 (×2): 1000 mL via INTRAVENOUS

## 2011-08-21 MED ORDER — PROMETHAZINE HCL 25 MG/ML IJ SOLN
25.0000 mg | Freq: Once | INTRAMUSCULAR | Status: AC
  Administered 2011-08-21: 25 mg via INTRAMUSCULAR

## 2011-08-21 MED ORDER — SODIUM CHLORIDE 0.9 % IV SOLN
1000.0000 mL | INTRAVENOUS | Status: DC
  Administered 2011-08-21 – 2011-08-22 (×2): 1000 mL via INTRAVENOUS

## 2011-08-21 MED ORDER — METOPROLOL SUCCINATE ER 100 MG PO TB24
100.0000 mg | ORAL_TABLET | Freq: Every day | ORAL | Status: DC
  Administered 2011-08-22: 100 mg via ORAL
  Filled 2011-08-21 (×2): qty 1

## 2011-08-21 MED ORDER — HYDRALAZINE HCL 20 MG/ML IJ SOLN
10.0000 mg | INTRAMUSCULAR | Status: DC | PRN
  Filled 2011-08-21: qty 0.5

## 2011-08-21 MED ORDER — SODIUM CHLORIDE 0.9 % IV BOLUS (SEPSIS): 1000.0000 mL | Freq: Once | INTRAVENOUS | Status: DC

## 2011-08-21 NOTE — ED Notes (Signed)
RN from the floor called back.  I was in pt room and asked if she would please hold just a minute as I was inserting an IV.  When I returned to the phone she had hung up.

## 2011-08-21 NOTE — ED Notes (Signed)
Called the floor to give report and Wadiya, RN.

## 2011-08-21 NOTE — Assessment & Plan Note (Signed)
gaven phenergan and .5 mg ativan.  Pt had significant improvement in nausea but still had pain. Not significantly dehydrated on exam with MMM.   They desired eval in ED so they preceded there.  Safe for transport in car.

## 2011-08-21 NOTE — ED Notes (Signed)
Called the floor to give report and was advised that the nurse was unavailable because she was now in an isolation room. Will attempt one more time.

## 2011-08-21 NOTE — ED Notes (Signed)
Patient was here 2 weeks ago and has an appointment with a surgeon but states he is having severe pain and the pain medication is not working. Patient has is having severe abdominal pain and diaphoretic is unable to talk with staff.  Information was given by family.

## 2011-08-21 NOTE — Progress Notes (Signed)
  Subjective:    Patient ID: Tony [name], male    DOB: Oct 22, 1965, 46 y.o.   MRN: [id number]  HPI Pt here 4 days after d/c from hospital for abd pain and vomitting.  Wife states taht since d/c he has been unable to eat and can't hold down anything except gatorade. (conflicting since he states he is taking his meds and his miralax). They haven't picked up zofran because it needs a prior auth.  He has continued with the same pain from hospitalization without improvement.  Last BM 3 days ago.  Reports taking miralax without BM.  No fevers.  donnatal helps a little but the pain quickly comes back.  This is the longest episode he has had.    He denies taking norco and became angry when I asked him about it.  His wife says he has been taking it however.     Review of Systems See above    Objective:   Physical Exam  Gen- seems in a great deal of pain, holding his stomach, gagging Heart - normal rate, regular rhythm, normal S1, S2, no murmurs, rubs, clicks or gallops Chest - clear to auscultation, no wheezes, rales or rhonchi, symmetric air entry, no tachypnea, retractions or cyanosis Abd- BS present, maybe decreased.  Soft.  Diffusely tender.  No rebound.   Extremities - peripheral pulses normal, no pedal edema, no clubbing or cyanosis       Assessment & Plan:

## 2011-08-21 NOTE — ED Notes (Signed)
To ed for further eval of abd pain. D/c'd from hospital last Thursday with dx of IBS. Not feeling any better.

## 2011-08-21 NOTE — ED Provider Notes (Signed)
History     CSN: [id number]  Arrival date & time 08/21/11  1448   First MD Initiated Contact with Patient 08/21/11 1812      Chief Complaint  Patient presents with  . Abdominal Pain    (Consider location/radiation/quality/duration/timing/severity/associated sxs/prior treatment) HPI The patient presents to the emergency room with complaints of recurrent abdominal pain. He was recently in the hospital and ultimately was diagnosed with irritable bowel syndrome. Patient had nausea, vomiting, and diarrhea associated with abdominal pain. During his evaluation he did have a CT scan of the abdomen and pelvis on March 4 was normal. Patient was released but has been having persistent symptoms.  He is scheduled to see a gastrointestinal specialist but has not had that appointment yet. Patient went to see his doctor today but wanted to come to the emergency room because of his persistent symptoms. Patient states he's having vomiting and diarrhea. He is having abdominal pain it is all over. He is unable to keep anything down other than Gatorade. Eating makes it worse. Nothing seems to make it any better.  Past Medical History  Diagnosis Date  . Hypertension   . GERD (gastroesophageal reflux disease)   . Gastric ulcer due to Helicobacter pylori 2008  . Pneumonia 2011  . Mesenteric adenitis 2011    presumed/H&P  . IBS (irritable bowel syndrome)     Past Surgical History  Procedure Date  . Knee arthroscopy     left    Family History  Problem Relation Age of Onset  . Hypertension Mother   . Hypertension Father   . Heart disease Father     CAD  . Diabetes Neg Hx   . Stroke Neg Hx     History  Substance Use Topics  . Smoking status: Current Everyday Smoker -- 1.0 packs/day for 10 years    Types: Cigarettes  . Smokeless tobacco: Never Used  . Alcohol Use: 4.2 oz/week    7 Cans of beer per week      Review of Systems  All other systems reviewed and are negative.    Allergies    Morphine and related  Home Medications   Current Outpatient Rx  Name Route Sig Dispense Refill  . ACETAMINOPHEN 500 MG PO TABS Oral Take 1,000 mg by mouth every 6 (six) hours as needed. For fever and pain    . BELLADONNA ALK-PHENOBARBITAL 16.2 MG PO TABS Oral Take 1 tablet by mouth every 6 (six) hours as needed (for abdominal cramping). Take every 6 hours for the next 3-4 days 120 tablet 0  . HYDROCODONE-ACETAMINOPHEN 5-325 MG PO TABS Oral Take 1 tablet by mouth every 6 (six) hours as needed. For knee pain    . METOPROLOL SUCCINATE ER 100 MG PO TB24 Oral Take 100 mg by mouth at bedtime. Take with or immediately following a meal.    . ONDANSETRON 8 MG PO TBDP Oral Take 1 tablet (8 mg total) by mouth every 8 (eight) hours as needed. 1 tab on tongue every 8 hours as needed for nausea and vomiting 30 tablet 0  . POLYETHYLENE GLYCOL 3350 PO POWD Oral Take 17 g by mouth 2 (two) times daily. Mix 1 capful in 8 oz of fluid daily as needed for constipation. Dispense generic, 1 large bottle 527 g 1  . PROMETHAZINE HCL 25 MG PO TABS Oral Take 25 mg by mouth every 8 (eight) hours as needed.      BP 178/114  Pulse 78  Temp(Src) 100.1  F (37.8 C) (Oral)  Resp 15  SpO2 100%  Physical Exam  Nursing note and vitals reviewed. Constitutional: He appears well-developed and well-nourished. No distress.  HENT:  Head: Normocephalic and atraumatic.  Right Ear: External ear normal.  Left Ear: External ear normal.  Eyes: Conjunctivae are normal. Right eye exhibits no discharge. Left eye exhibits no discharge. No scleral icterus.  Neck: Neck supple. No tracheal deviation present.  Cardiovascular: Normal rate, regular rhythm and intact distal pulses.   Pulmonary/Chest: Effort normal and breath sounds normal. No stridor. No respiratory distress. He has no wheezes. He has no rales.  Abdominal: Soft. Bowel sounds are normal. He exhibits no distension. There is generalized tenderness. There is no rebound and no  guarding.  Musculoskeletal: He exhibits no edema and no tenderness.  Neurological: He is alert. He has normal strength. No sensory deficit. Cranial nerve deficit:  no gross defecits noted. He exhibits normal muscle tone. He displays no seizure activity. Coordination normal.  Skin: Skin is warm and dry. No rash noted.  Psychiatric: He has a normal mood and affect.    ED Course  Procedures (including critical care time)  Labs Reviewed  COMPREHENSIVE METABOLIC PANEL - Abnormal; Notable for the following:    AST 44 (*)    ALT 122 (*)    GFR calc non Af Amer 76 (*)    GFR calc Af Amer 89 (*)    All other components within normal limits  URINALYSIS, ROUTINE W REFLEX MICROSCOPIC - Abnormal; Notable for the following:    Color, Urine AMBER (*) BIOCHEMICALS MAY BE AFFECTED BY COLOR   Specific Gravity, Urine 1.037 (*)    Bilirubin Urine SMALL (*)    Ketones, ur 40 (*)    Protein, ur 30 (*)    All other components within normal limits  CBC  DIFFERENTIAL  LIPASE, BLOOD  URINE MICROSCOPIC-ADD ON   Dg Abd Acute W/chest  08/21/2011  *RADIOLOGY REPORT*  Clinical Data: Abdominal pain for 2 weeks  ACUTE ABDOMEN SERIES (ABDOMEN 2 VIEW & CHEST 1 VIEW)  Comparison: 03/15/2010  Findings: Cardiomediastinal silhouette is stable.  No acute infiltrate or pleural effusion.  No pulmonary edema.  There is nonspecific nonobstructive bowel gas pattern.  Some stool noted in the right colon.  No free abdominal air.  IMPRESSION: No acute disease.  Nonspecific nonobstructive bowel gas pattern. No free abdominal air.  Original Report Authenticated By: Natasha Mead, M.D.     No diagnosis found.    MDM  I reviewed the patient's previous studies. He recently had an abdominal pelvic CT that did not show an acute process. Patient's labs to suggest dehydration. He has a slight increase in his LFTs but it does appear chronic. The patient does have dehydration based on his urinalysis.  I discussed outpatient treatment but  he started to vomit again. We'll consult his primary doctor to discuss whether or like to admit him for observation due to his recurrent pain, vomiting, and inability to keep down by mouth fluids.       Celene Kras, MD 08/21/11 2119

## 2011-08-21 NOTE — Telephone Encounter (Signed)
Pt is not any better from release from hosp last Thurs.  Wants to talk to nurse about taking him to [facility] instead of [facility] since they didn't help him.  Has an appt here on Wed.

## 2011-08-21 NOTE — ED Notes (Signed)
Pt actively vomiting and c/o abdominal pain at 10/10.  Dr. Lynelle Doctor notified, awaiting new orders.

## 2011-08-21 NOTE — ED Notes (Signed)
Pt report received from [place], [place].

## 2011-08-21 NOTE — Telephone Encounter (Signed)
Returned call to patient's wife.  Patient was d/c'd from hospital last week after being dx'd with IBS.  Continues to have abdominal cramping and vomiting.  Has appt with PCP on Wednesday, but unable to wait until then.  Appt scheduled for today with Dr. Hulen Luster at 1:30 pm to be re-evaluated.  Gaylene Brooks, RN

## 2011-08-21 NOTE — ED Notes (Signed)
Brought pt swab to moisten mouth per family request and notified them that we need a urine sample

## 2011-08-21 NOTE — ED Notes (Signed)
Pt states was just released from hospital for the same thing has still had n/v/d states is waiting for GI consult

## 2011-08-21 NOTE — H&P (Signed)
Tony [name] is an 46 y.o. male.   Chief Complaint: abdominal pain, nausea/vomiting HPI: This is a 46 year old African-American male with a history of chronic abdominal pain and who was recently hospitalized for acute on chronic abdominal pain and discharged on 03/04 who presents with persistent significant abdominal pain.  He was diagnosed with irritable bowel syndrome after a negative work-up during his most recent previous admission. He reports persistence of epigastric/diffuse abdominal pain and nausea despite medications, including prn Donnatal and Zofran. He was unable to get the Phenergan filled due to his insurance not covering this medication. He went to his PCP's office today and was seen by one of the providers, but left abruptly, insisting he needed to come to the ED.  In the ED, he received a dose of fentanyl 100 mcg and 2 doses of Zofran without significant relief of his symptoms.   He has a history of chronic abdominal pain that has been going on for several years and for which he has been hospitalized several times. He had been given diagnoses of viral gastroenteritis, gastritis/duodenitis based on EGD and Bx, and mesenteric adenitis based on CT. He reports not following-up with [facility] as recommended due to financial difficulties. He has an appointment with them on 03/27 that was made at the time of his previous discharge.   The patient and his wife do not feel comfortable with the patient's going home in this much discomfort. He reports poor oral intake as well for the past several days.   Past Medical History  Diagnosis Date  . Hypertension   . GERD (gastroesophageal reflux disease)   . Gastric ulcer due to Helicobacter pylori 2008  . Pneumonia 2011  . Mesenteric adenitis 2011    presumed/H&P  . IBS (irritable bowel syndrome)     Past Surgical History  Procedure Date  . Knee arthroscopy     left    Family History  Problem Relation Age of Onset  . Hypertension  Mother   . Hypertension Father   . Heart disease Father     CAD  . Diabetes Neg Hx   . Stroke Neg Hx    Social History:  History   Social History  . Marital Status: Married    Spouse Name: N/A    Number of Children: N/A  . Years of Education: N/A   Social History Main Topics  . Smoking status: Current Everyday Smoker -- 1.0 packs/day for 10 years    Types: Cigarettes  . Smokeless tobacco: Never Used  . Alcohol Use: 4.2 oz/week    7 Cans of beer per week  . Drug Use: Yes    Special: Marijuana     08/14/11 "last drug use ~ 30 yr ago"  . Sexually Active: None   Other Topics Concern  . None   Social History Narrative   Lives with wife and two children.  Administrator at [employer]    Allergies:  Allergies  Allergen Reactions  . Morphine And Related Other (See Comments)    hallucinations    Medications Prior to Admission  Medication Dose Route Frequency Provider Last Rate Last Dose  . 0.9 %  sodium chloride infusion  1,000 mL Intravenous Once Celene Kras, MD 999 mL/hr at 08/21/11 2223 1,000 mL at 08/21/11 2223   Followed by  . 0.9 %  sodium chloride infusion  1,000 mL Intravenous Continuous Celene Kras, MD 125 mL/hr at 08/21/11 2223 1,000 mL at 08/21/11 2223  . fentaNYL (  SUBLIMAZE) injection 100 mcg  100 mcg Intravenous Once Celene Kras, MD   100 mcg at 08/21/11 1853  . fentaNYL (SUBLIMAZE) injection 100 mcg  100 mcg Intravenous Once Celene Kras, MD   100 mcg at 08/21/11 2203  . LORazepam (ATIVAN) injection 0.5 mg  0.5 mg Intramuscular Once Reginold Agent, MD   0.5 mg at 08/21/11 1423  . morphine 4 MG/ML injection 4 mg  4 mg Intravenous NOW Shelly Rubenstein, MD   4 mg at 08/21/11 2219  . ondansetron (ZOFRAN) injection 4 mg  4 mg Intravenous Once Celene Kras, MD   4 mg at 08/21/11 1853  . ondansetron (ZOFRAN) injection 4 mg  4 mg Intravenous Once Celene Kras, MD   4 mg at 08/21/11 2203  . promethazine (PHENERGAN) injection 12.5 mg  12.5 mg Intravenous To Minor Elsie Saas  Park, MD      . promethazine [facility]) injection 25 mg  25 mg Intramuscular Once Reginold Agent, MD   25 mg at 08/21/11 1424   Medications Prior to Admission  Medication Sig Dispense Refill  . acetaminophen (TYLENOL) 500 MG tablet Take 1,000 mg by mouth every 6 (six) hours as needed. For fever and pain      . atropine-PHENObarbital-scopolamine-hyoscyamine (DONNATAL) 16.2 MG tablet Take 1 tablet by mouth every 6 (six) hours as needed (for abdominal cramping). Take every 6 hours for the next 3-4 days  120 tablet  0  . HYDROcodone-acetaminophen (NORCO) 5-325 MG per tablet Take 1 tablet by mouth every 6 (six) hours as needed. For knee pain      . metoprolol succinate (TOPROL-XL) 100 MG 24 hr tablet Take 100 mg by mouth at bedtime. Take with or immediately following a meal.      . ondansetron (ZOFRAN ODT) 8 MG disintegrating tablet Take 1 tablet (8 mg total) by mouth every 8 (eight) hours as needed. 1 tab on tongue every 8 hours as needed for nausea and vomiting  30 tablet  0  . polyethylene glycol powder (MIRALAX) powder Take 17 g by mouth 2 (two) times daily. Mix 1 capful in 8 oz of fluid daily as needed for constipation. Dispense generic, 1 large bottle  527 g  1    Results for orders placed during the hospital encounter of 08/21/11 (from the past 48 hour(s))  CBC     Status: Normal   Collection Time   08/21/11  5:20 PM      Component Value Range Comment   WBC 6.7  4.0 - 10.5 (K/uL)    RBC 4.81  4.22 - 5.81 (MIL/uL)    Hemoglobin 15.2  13.0 - 17.0 (g/dL)    HCT 81.1  91.4 - 78.2 (%)    MCV 89.2  78.0 - 100.0 (fL)    MCH 31.6  26.0 - 34.0 (pg)    MCHC 35.4  30.0 - 36.0 (g/dL)    RDW 95.6  21.3 - 08.6 (%)    Platelets 246  150 - 400 (K/uL)   DIFFERENTIAL     Status: Normal   Collection Time   08/21/11  5:20 PM      Component Value Range Comment   Neutrophils Relative 53  43 - 77 (%)    Neutro Abs 3.5  1.7 - 7.7 (K/uL)    Lymphocytes Relative 37  12 - 46 (%)    Lymphs Abs 2.5  0.7 -  4.0 (K/uL)    Monocytes Relative  7  3 - 12 (%)    Monocytes Absolute 0.5  0.1 - 1.0 (K/uL)    Eosinophils Relative 3  0 - 5 (%)    Eosinophils Absolute 0.2  0.0 - 0.7 (K/uL)    Basophils Relative 1  0 - 1 (%)    Basophils Absolute 0.0  0.0 - 0.1 (K/uL)   COMPREHENSIVE METABOLIC PANEL     Status: Abnormal   Collection Time   08/21/11  5:20 PM      Component Value Range Comment   Sodium 136  135 - 145 (mEq/L)    Potassium 3.9  3.5 - 5.1 (mEq/L)    Chloride 100  96 - 112 (mEq/L)    CO2 23  19 - 32 (mEq/L)    Glucose, Bld 86  70 - 99 (mg/dL)    BUN 12  6 - 23 (mg/dL)    Creatinine, Ser 1.61  0.50 - 1.35 (mg/dL)    Calcium 9.7  8.4 - 10.5 (mg/dL)    Total Protein 7.7  6.0 - 8.3 (g/dL)    Albumin 4.2  3.5 - 5.2 (g/dL)    AST 44 (*) 0 - 37 (U/L)    ALT 122 (*) 0 - 53 (U/L)    Alkaline Phosphatase 102  39 - 117 (U/L)    Total Bilirubin 0.3  0.3 - 1.2 (mg/dL)    GFR calc non Af Amer 76 (*) >90 (mL/min)    GFR calc Af Amer 89 (*) >90 (mL/min)   LIPASE, BLOOD     Status: Normal   Collection Time   08/21/11  5:20 PM      Component Value Range Comment   Lipase 13  11 - 59 (U/L)   URINALYSIS, ROUTINE W REFLEX MICROSCOPIC     Status: Abnormal   Collection Time   08/21/11  7:11 PM      Component Value Range Comment   Color, Urine AMBER (*) YELLOW  BIOCHEMICALS MAY BE AFFECTED BY COLOR   APPearance CLEAR  CLEAR     Specific Gravity, Urine 1.037 (*) 1.005 - 1.030     pH 6.0  5.0 - 8.0     Glucose, UA NEGATIVE  NEGATIVE (mg/dL)    Hgb urine dipstick NEGATIVE  NEGATIVE     Bilirubin Urine SMALL (*) NEGATIVE     Ketones, ur 40 (*) NEGATIVE (mg/dL)    Protein, ur 30 (*) NEGATIVE (mg/dL)    Urobilinogen, UA 0.2  0.0 - 1.0 (mg/dL)    Nitrite NEGATIVE  NEGATIVE     Leukocytes, UA NEGATIVE  NEGATIVE    URINE MICROSCOPIC-ADD ON     Status: Normal   Collection Time   08/21/11  7:11 PM      Component Value Range Comment   Squamous Epithelial / LPF RARE  RARE     WBC, UA 0-2  <3 (WBC/hpf)     Urine-Other MUCOUS PRESENT      Dg Abd Acute W/chest  08/21/2011  *RADIOLOGY REPORT*  Clinical Data: Abdominal pain for 2 weeks  ACUTE ABDOMEN SERIES (ABDOMEN 2 VIEW & CHEST 1 VIEW)  Comparison: 03/15/2010  Findings: Cardiomediastinal silhouette is stable.  No acute infiltrate or pleural effusion.  No pulmonary edema.  There is nonspecific nonobstructive bowel gas pattern.  Some stool noted in the right colon.  No free abdominal air.  IMPRESSION: No acute disease.  Nonspecific nonobstructive bowel gas pattern. No free abdominal air.  Original Report Authenticated By: Natasha Mead, M.D.  ROS Reports elevated temperatures to 100s at home. Denies chills. Denies chest pain, difficulty breathing, heart palpitations Denies constipation (he has had small bowel movements recently) or blood on stool  Blood pressure 170/89, pulse 81, temperature 97.6 F (36.4 C), temperature source Oral, resp. rate 20, SpO2 99.00%. Physical Exam  Gen: moderate-severe distress, writhing in bed, diaphoretic, accompanied by wife and children HEENT: PERRL, EOMI, no nasal congestion, dry MM CV: RRR, no m/r/g Pulm: CTAB without w/r/r, decreased BS bilateral bases, fair aeration Abd: hyperactive BS, soft, moderate diffuse tenderness worse in RUQ without guarding or rebound Ext: no edema Skin: warm, diaphoretic  Assessment/Plan This is a 46 year old African-American male with a history of chronic abdominal pain presenting with persistent abdominal pain and now with worsening nausea/vomiting after being discharged from the hospital for the same on 03/04.   Will admit to floor bed, observation, for symptom management and inpatient GI consultation.   Acute on chronic abdominal pain Nausea/vomiting History of GERD -Will consult  GI in the AM for possible endoscopy/colonoscopy. Patient and wife do not think patient will be able to make it to outpatient GI appointment on 03/27 due to severity of symptoms. We will discuss  with GI about appropriateness for inpatient EGD/colonoscopy. Patient has had a history of gastritis/duodenitis diagnosed by EGD biopsy in the past. They are willing to accept the diagnosis of IBS, however, would be reassured by negative EGD/colonoscopy findings. -Check H. pylori IgG -Alternating phenergan/Zofran prn for nausea -Dilaudid 0.5 mg q3 prn pain (morphine causes hallucinations) -Will check UDS. History of marijuana use. Wondering if this could be setting off symptoms.  -Protonix 40 IV qd -Will hold Donnatal for now. Consider anti-spasmodic medication +/- TCA/SSRI.   CV History of hypertension -BP elevated currently 170s systolics likely 2/2 to distress -Manage pain and nausea/vomiting -Continue home metoprolol  FEN/GI Dehydration--2/2 vomiting -Received 1L NS fluid bolus. Still appears dry. Will give additional L. -NS @ 125 mL/hr -Diet: NPO  PPx: -DVT PPx: heparin SQ -SUP PPx: Protonix  Disposition: Pending clinical improvement.    OH [name], Tony Halvorsen 08/21/2011, 10:32 PM

## 2011-08-21 NOTE — ED Notes (Signed)
Called the floor to give report and nurse Daryel Gerald, RN is unavailable at this time.  Secretary advised that she would return the call ASAP.

## 2011-08-21 NOTE — ED Notes (Signed)
Patient transported to X-ray 

## 2011-08-21 NOTE — Patient Instructions (Signed)
I am sorry you are in pain We gave you phenergan today which seems to have helped.  I will send in a Rx for that today--it should be easier for your insurance to cover.

## 2011-08-22 ENCOUNTER — Encounter (HOSPITAL_COMMUNITY): Payer: Self-pay

## 2011-08-22 ENCOUNTER — Encounter (HOSPITAL_COMMUNITY)
Admission: EM | Disposition: A | Discharge status: Home / Self Care | Payer: Self-pay | Source: Home / Self Care | Attending: Family Medicine

## 2011-08-22 DIAGNOSIS — R109 Unspecified abdominal pain: Secondary | ICD-10-CM

## 2011-08-22 DIAGNOSIS — R1013 Epigastric pain: Secondary | ICD-10-CM

## 2011-08-22 DIAGNOSIS — R112 Nausea with vomiting, unspecified: Secondary | ICD-10-CM

## 2011-08-22 HISTORY — PX: ESOPHAGOGASTRODUODENOSCOPY: SHX5428

## 2011-08-22 LAB — CBC
HCT: 35.7 % — ABNORMAL LOW (ref 39.0–52.0)
Hemoglobin: 12.2 g/dL — ABNORMAL LOW (ref 13.0–17.0)
MCH: 30.5 pg (ref 26.0–34.0)
MCHC: 34.2 g/dL (ref 30.0–36.0)
MCV: 89.3 fL (ref 78.0–100.0)
Platelets: 229 K/uL (ref 150–400)
RBC: 4 MIL/uL — ABNORMAL LOW (ref 4.22–5.81)
RDW: 12.7 % (ref 11.5–15.5)
WBC: 5.7 K/uL (ref 4.0–10.5)

## 2011-08-22 LAB — RAPID URINE DRUG SCREEN, HOSP PERFORMED
Amphetamines: NOT DETECTED
Barbiturates: POSITIVE — AB
Benzodiazepines: NOT DETECTED
Cocaine: NOT DETECTED
Opiates: NOT DETECTED
Tetrahydrocannabinol: NOT DETECTED

## 2011-08-22 LAB — COMPREHENSIVE METABOLIC PANEL
AST: 37 U/L (ref 0–37)
Albumin: 3.2 g/dL — ABNORMAL LOW (ref 3.5–5.2)
Calcium: 8.3 mg/dL — ABNORMAL LOW (ref 8.4–10.5)
Chloride: 107 mEq/L (ref 96–112)
Creatinine, Ser: 0.86 mg/dL (ref 0.50–1.35)

## 2011-08-22 SURGERY — EGD (ESOPHAGOGASTRODUODENOSCOPY)
Anesthesia: Moderate Sedation

## 2011-08-22 MED ORDER — WHITE PETROLATUM GEL
Status: AC
  Administered 2011-08-22: 17:00:00
  Filled 2011-08-22: qty 5

## 2011-08-22 MED ORDER — BUTAMBEN-TETRACAINE-BENZOCAINE 2-2-14 % EX AERO
INHALATION_SPRAY | CUTANEOUS | Status: DC | PRN
  Administered 2011-08-22: 2 via TOPICAL

## 2011-08-22 MED ORDER — HYDROMORPHONE HCL PF 1 MG/ML IJ SOLN
0.5000 mg | INTRAMUSCULAR | Status: DC | PRN
  Administered 2011-08-22 – 2011-08-23 (×5): 0.5 mg via INTRAVENOUS
  Filled 2011-08-22 (×5): qty 1

## 2011-08-22 MED ORDER — POLYETHYLENE GLYCOL 3350 17 G PO PACK
17.0000 g | PACK | Freq: Two times a day (BID) | ORAL | Status: DC
  Administered 2011-08-22 – 2011-08-23 (×2): 17 g via ORAL
  Filled 2011-08-22 (×4): qty 1

## 2011-08-22 MED ORDER — MIDAZOLAM HCL 10 MG/2ML IJ SOLN
INTRAMUSCULAR | Status: DC | PRN
  Administered 2011-08-22: 2.5 mg via INTRAVENOUS
  Administered 2011-08-22: 2 mg via INTRAVENOUS
  Administered 2011-08-22: 2.5 mg via INTRAVENOUS

## 2011-08-22 MED ORDER — SODIUM CHLORIDE 0.9 % IV BOLUS (SEPSIS)
500.0000 mL | Freq: Once | INTRAVENOUS | Status: AC
  Administered 2011-08-22: 500 mL via INTRAVENOUS

## 2011-08-22 MED ORDER — ONDANSETRON 8 MG/NS 50 ML IVPB
8.0000 mg | Freq: Four times a day (QID) | INTRAVENOUS | Status: AC
  Administered 2011-08-22 (×4): 8 mg via INTRAVENOUS
  Filled 2011-08-22 (×4): qty 8

## 2011-08-22 MED ORDER — POLYETHYLENE GLYCOL 3350 17 GM/SCOOP PO POWD: 17.0000 g | Freq: Two times a day (BID) | ORAL | Status: DC

## 2011-08-22 MED ORDER — SUCRALFATE 1 GM/10ML PO SUSP
1.0000 g | Freq: Four times a day (QID) | ORAL | Status: DC
  Administered 2011-08-22 – 2011-08-23 (×4): 1 g via ORAL
  Filled 2011-08-22 (×7): qty 10

## 2011-08-22 MED ORDER — DIPHENHYDRAMINE HCL 50 MG/ML IJ SOLN
INTRAMUSCULAR | Status: AC
  Filled 2011-08-22: qty 1

## 2011-08-22 MED ORDER — PROMETHAZINE HCL 25 MG/ML IJ SOLN
12.5000 mg | Freq: Four times a day (QID) | INTRAMUSCULAR | Status: AC
  Administered 2011-08-22 (×3): 12.5 mg via INTRAVENOUS
  Filled 2011-08-22 (×4): qty 1

## 2011-08-22 MED ORDER — HEPARIN SODIUM (PORCINE) 5000 UNIT/ML IJ SOLN
5000.0000 [IU] | Freq: Three times a day (TID) | INTRAMUSCULAR | Status: DC
  Administered 2011-08-22 – 2011-08-23 (×4): 5000 [IU] via SUBCUTANEOUS
  Filled 2011-08-22 (×7): qty 1

## 2011-08-22 MED ORDER — PANTOPRAZOLE SODIUM 40 MG PO TBEC
40.0000 mg | DELAYED_RELEASE_TABLET | Freq: Two times a day (BID) | ORAL | Status: DC
  Administered 2011-08-22 – 2011-08-23 (×2): 40 mg via ORAL
  Filled 2011-08-22 (×2): qty 1

## 2011-08-22 MED ORDER — FENTANYL NICU IV SYRINGE 50 MCG/ML
INJECTION | INTRAMUSCULAR | Status: DC | PRN
  Administered 2011-08-22 (×3): 25 ug via INTRAVENOUS

## 2011-08-22 MED ORDER — CITALOPRAM HYDROBROMIDE 10 MG PO TABS
10.0000 mg | ORAL_TABLET | Freq: Every day | ORAL | Status: DC
  Filled 2011-08-22 (×2): qty 1

## 2011-08-22 MED ORDER — FENTANYL CITRATE 0.05 MG/ML IJ SOLN
INTRAMUSCULAR | Status: AC
  Filled 2011-08-22: qty 4

## 2011-08-22 MED ORDER — SODIUM CHLORIDE 0.9 % IV SOLN
1000.0000 mL | INTRAVENOUS | Status: DC
  Administered 2011-08-22 – 2011-08-23 (×3): 1000 mL via INTRAVENOUS

## 2011-08-22 MED ORDER — MIDAZOLAM HCL 10 MG/2ML IJ SOLN
INTRAMUSCULAR | Status: AC
  Filled 2011-08-22: qty 4

## 2011-08-22 NOTE — Progress Notes (Signed)
Family Medicine Teaching Service Daily Resident Note   Patient name: Tony [name] Medical record number: [id number] Date of birth: 1965-06-23 Age: 46 y.o. Gender: male Length of Stay:  LOS: 1 day             SUBJECTIVE & OVERNIGHT EVENTS  Pt reports feeling better with Phenergan.  His abdominal pain is much improved and he is not having vomiting since this morning.  Does report dark urine with poor urinary output at this time.             OBJECTIVE  Vitals: Patient Vitals for the past 24 hrs:  BP Temp Temp src Pulse Resp SpO2 Weight  08/22/11 0530 145/81 mmHg 97.9 F (36.6 C) Oral 64  20  96 % -  08/22/11 0000 145/78 mmHg 97.9 F (36.6 C) Oral 67  20  99 % 376 lb 12.3 oz (170.9 kg)  08/21/11 2330 160/81 mmHg - - - - - -  08/21/11 2300 140/78 mmHg - - - - - -  08/21/11 2230 158/90 mmHg - - - - - -  08/21/11 2200 181/95 mmHg - - - - - -  08/21/11 2130 145/80 mmHg - - - - - -  08/21/11 2117 170/89 mmHg 97.6 F (36.4 C) Oral 81  20  99 % -  08/21/11 2110 - - - - - 99 % -  08/21/11 2045 156/88 mmHg - - - - - -  08/21/11 2015 151/89 mmHg - - - - - -  08/21/11 2003 151/87 mmHg - - - - - -  08/21/11 1800 178/114 mmHg 100.1 F (37.8 C) Oral 78  - 100 % -  08/21/11 1510 172/83 mmHg 99.4 F (37.4 C) Oral 63  15  97 % -   Wt Readings from Last 3 Encounters:  08/22/11 376 lb 12.3 oz (170.9 kg)  08/21/11 168 lb (76.204 kg)  04/04/11 185 lb 6.4 oz (84.097 kg)   No intake or output data in the 24 hours ending 08/22/11 0958  PE: GENERAL: Adult AA male, in mild discomfort; no resp distress; alert and appropriate  HNEENT: AT/[place], MMM, no scleral icterus, EOMi  THORAX:  HEART: RRR, S1/S2 heard, no murmur  LUNGS: CTA B, no wheezes, no crackles  ABDOMEN: +BS, soft, no rigidity, no guarding, no masses/organomegaly, Tender over epigastric area and R hemiabdomen;  EXTREMITIES: Moves all 4 extremities spontaneously, warm well perfused, no edema, bilateral DP and PT pulses 2/4.    LABS: Hematology:  Lab 08/22/11 0525 08/21/11 1720 08/19/11 2146  WBC 5.7 6.7 7.9  HGB 12.2* 15.2 14.2  HCT 35.7* 42.9 40.2  PLT 229 246 211  RDW 12.7 12.7 12.4  MCV 89.3 89.2 88.7  MCHC 34.2 35.4 35.3    Lab 08/21/11 1720 08/19/11 2146 08/16/11 1514  LYMPHSABS 2.5 1.6 1.1  EOSABS 0.2 0.6 0.1  BASOSABS 0.0 0.0 0.0   Metabolic:  Lab 08/22/11 0525 08/21/11 1720 08/19/11 2146  NA 137 136 138  K 3.5 3.9 3.8  CL 107 100 102  CO2 19 23 25   BUN 12 12 10   CREATININE 0.86 1.15 1.12  GLUCOSE 77 86 112*  CALCIUM 8.3* 9.7 9.2  MG -- -- --  PHOS -- -- --    Lab 08/22/11 0525 08/21/11 1720 08/17/11 0630  AST 37 44* 49*  ALKPHOS 79 102 88  BILITOT 0.3 0.3 0.4  BILIDIR -- -- --  PROT 5.7* 7.7 6.1  ALBUMIN 3.2* 4.2 3.3*  PREALBUMIN -- -- --  Lab 08/16/11 2125  CHOL 162  TRIG 101  HDL 34*    Lab 08/21/11 1720  AMYLASE --  LIPASE 13   MICRO: Urine dipstick shows positive for protein and positive for ketones. Sp Gravity = 1.037 No Cx   IMAGING: 3/11 - CXR - No acute disease. Nonspecific nonobstructive bowel gas pattern. No free abdominal air.  MEDS:    . sodium chloride  1,000 mL Intravenous Once  . fentaNYL  100 mcg Intravenous Once  . fentaNYL  100 mcg Intravenous Once  . heparin  5,000 Units Subcutaneous Q8H  . metoprolol succinate  100 mg Oral QHS  .  morphine injection  4 mg Intravenous NOW  . ondansetron (ZOFRAN) IV  8 mg Intravenous Q6H  . ondansetron  4 mg Intravenous Once  . ondansetron  4 mg Intravenous Once  . promethazine  12.5 mg Intravenous Q6H  . promethazine  12.5 mg Intravenous To Minor  . sodium chloride  500 mL Intravenous Once  . DISCONTD: sodium chloride  1,000 mL Intravenous Once   hydrALAZINE, HYDROmorphone (DILAUDID) injection            ASSESSMENT & PLAN  46 yo with chronic intermittent abdominal pain here 4 day readmission for continued pain and persisting N/V.  HOSP Day#1  1. Abdominal Pain (Likely Irritable Bowel  Syndrome)  *Donnatal (belladonna alkaloids + phenobarbital/atropine/scopolamine)   *Zofran  *Phenergan  *Miralax  *Celexa 10mg  - [facility] CONSULTED - Improved overnight with phenergan  - will restart miralax - start celexa - NPO at this time for consideration of EGD.  Prior notes from GI indicated ? Need for capsule endoscopy - H.Pylori antigen pending - will likely be positive either way due to prior dx - will ?need repeat EGD to dx re-infection - Stool H.Pylori ordered  2. Hypertension: stable today - will continue to monitor - likely pain associated  *Toprol XL Pt does report that when he takes Donnatal + Toprol his pain seems worse - consider changing to ACEi but not in acute setting with dehydration  3. Chronic pain: Takes vicodin from orthopedist for chronic knee pain - will continue po pain meds prn; Opioids likely contributing to chronic abdominal pain  --- FEN/IVFs - Dehydration 2o/2 vomiting *NS @ 175ml/hr  S/p 2 L NS Bolus in ED - Will bolus 500cc again at this time - Diet NPO - ice chips --- PPx: PPI + heparin   - pt instructed to perform deep breathing exercises at this time for airway ppx (concerns for sedating meds and vomiting) - declines IS currently.              DISPOSITION  Pending clinical improvement.  GI to evaluate later today   Andrena Mews, DO [facility] Resident - PGY-1 08/22/2011 9:58 AM

## 2011-08-22 NOTE — Progress Notes (Signed)
Duodenal ulcer found which explains dyspeptic symptoms + abd pain High suspicion for recurrent H. Pylori and inciting factor for this ulcer. BID PPI. Follow-up H. Pylori status with path obtained today. I have added sucralfate QID to try and help with symptoms. Call with questions.

## 2011-08-22 NOTE — Op Note (Signed)
[facility] [address] [place], [place]  [address]  ENDOSCOPY PROCEDURE REPORT  PATIENT:  Tony [name], Tony [name]  MR#:  [id number] BIRTHDATE:  September 30, 1965, 45 yrs. old  GENDER:  male ENDOSCOPIST:  Carie Caddy. Rhyan Wolters, MD Referred by:  Zachery Dauer, M.D. PROCEDURE DATE:  08/22/2011 PROCEDURE:  EGD with biopsy, 43239 ASA CLASS:  Class II INDICATIONS:  epigastric pain, dyspepsia MEDICATIONS:   Fentanyl 75 mcg IV, Versed 7 mg IV TOPICAL ANESTHETIC:  Cetacaine Spray  DESCRIPTION OF PROCEDURE:   After the risks benefits and alternatives of the procedure were thoroughly explained, informed consent was obtained.  The EG-[id number] ([id number]) endoscope was introduced through the mouth and advanced to the second portion of the duodenum, without limitations.  The instrument was slowly withdrawn as the mucosa was fully examined. <<PROCEDUREIMAGES>>  The esophagus and gastroesophageal junction were completely normal in appearance.  Mild gastritis characterized by erythema was found antrum Biopsies of the antrum and body of the stomach were obtained and sent to pathology.  A clean-based but deep ulcer was found in the bulb of the duodenum.  There was surrounding duodenitis.  The examined portion of the descending duodenum was unremarkable.  Retroflexed views revealed no abnormalities.    The scope was then withdrawn from the patient and the procedure completed.  COMPLICATIONS:  None  ENDOSCOPIC IMPRESSION: 1) Normal esophagus 2) Mild gastritis in the antrum. Multiple biopsies taken to evaluate for H. Pylori. 3) Ulcer in the bulb of duodenum with surrounding duodenitis.  RECOMMENDATIONS: 1) Await pathology results 2) Follow-up of helicobacter pylori status, treat if indicated 3) BID PPI for at least 8 weeks. 4) Avoid NSAIDs 5) Trial of sucralfate 1 g QID given epigastric pain.  Carie Caddy. Arianis Bowditch, MD  CC:  The Patient  n. eSIGNED:   Carie Caddy. Ginnette Gates at 08/22/2011 03:05 PM  Paula Compton, [phone]

## 2011-08-22 NOTE — Progress Notes (Signed)
Utilization review complete 

## 2011-08-22 NOTE — Consult Note (Signed)
Referring Provider: Teaching Service Primary Care Physician:  Demetria Pore, MD, MD Primary Gastroenterologist: None  Reason for Consultation:  Acute on chronic Abdominal Pain  HPI: Tony [name] is a 46 y.o. male with history of hypertension and depression . He has history of H. pylori induced gastritis which was diagnosed in 2007 on upper endoscopy per Dr. Claudette Head. Patient also had colonoscopy at that time which was unremarkable with the exception of internal hemorrhoids. Patient says he has had stomach problems for many years intermittently and remembers even as a child that he took Zantac periodically. He says at this point over the past couple of years he gets intermittent episodes of epigastric discomfort which most of the time will be resolved with either milk or baking soda. He says generally his bowels are very regular and he always has loose stools with 2-3 bowel movements per day. If he has a day or 2 without a bowel movement he takes a laxative and that usually helps as well. He is not on any regular aspirin or NSAIDs and denies any regular EtOH use. Now over the past couple of weeks he has been having intermittent more severe epigastric pain which he describes as burning aching in stabbing and located high in the epigastrium without radiation. He has had some associated nausea and vomiting. He was admitted to the hospital about a week ago had a CT scan of the abdomen and pelvis at that time which was unremarkable and labs which were unremarkable with the with the exception of a very minimal transaminitis. He was discharged with Donnatal but not on a PPI. He was readmitted yesterday after he had another episode of more severe pain that did not ease off. He seems to associate this with taking Donnatal and his blood pressure medicine at the same time area it Today he is still hurting but not quite as bad and has not had any vomiting. He says he has no appetite. He denies any dysphagia or  odynophagia, and his weight has been fairly stable.   Past Medical History  Diagnosis Date  . Hypertension   . GERD (gastroesophageal reflux disease)   . Gastric ulcer due to Helicobacter pylori 2008  . Pneumonia 2011  . Mesenteric adenitis 2011    presumed/H&P  . IBS (irritable bowel syndrome)     Past Surgical History  Procedure Date  . Knee arthroscopy     left    Prior to Admission medications   Medication Sig Start Date End Date Taking? Authorizing Provider  acetaminophen (TYLENOL) 500 MG tablet Take 1,000 mg by mouth every 6 (six) hours as needed. For fever and pain   Yes Historical Provider, MD  atropine-PHENObarbital-scopolamine-hyoscyamine (DONNATAL) 16.2 MG tablet Take 1 tablet by mouth every 6 (six) hours as needed (for abdominal cramping). Take every 6 hours for the next 3-4 days 08/17/11 08/27/11 Yes Andrena Mews, DO  HYDROcodone-acetaminophen [facility]) 5-325 MG per tablet Take 1 tablet by mouth every 6 (six) hours as needed. For knee pain   Yes Historical Provider, MD  metoprolol succinate (TOPROL-XL) 100 MG 24 hr tablet Take 100 mg by mouth at bedtime. Take with or immediately following a meal.   Yes Historical Provider, MD  ondansetron (ZOFRAN ODT) 8 MG disintegrating tablet Take 1 tablet (8 mg total) by mouth every 8 (eight) hours as needed. 1 tab on tongue every 8 hours as needed for nausea and vomiting 08/17/11  Yes Andrena Mews, DO  polyethylene glycol powder [facility])  powder Take 17 g by mouth 2 (two) times daily. Mix 1 capful in 8 oz of fluid daily as needed for constipation. Dispense generic, 1 large bottle 08/17/11  Yes Andrena Mews, DO  promethazine (PHENERGAN) 25 MG tablet Take 25 mg by mouth every 8 (eight) hours as needed. 08/21/11 08/28/11  Reginold Agent, MD    Current Facility-Administered Medications  Medication Dose Route Frequency Provider Last Rate Last Dose  . 0.9 %  sodium chloride infusion  1,000 mL Intravenous Once Celene Kras, MD 999 mL/hr at  08/21/11 2223 1,000 mL at 08/21/11 2223   Followed by  . 0.9 %  sodium chloride infusion  1,000 mL Intravenous Continuous Andrena Mews, DO      . citalopram (CELEXA) tablet 10 mg  10 mg Oral Daily Andrena Mews, DO      . fentaNYL (SUBLIMAZE) injection 100 mcg  100 mcg Intravenous Once Celene Kras, MD   100 mcg at 08/21/11 1853  . fentaNYL (SUBLIMAZE) injection 100 mcg  100 mcg Intravenous Once Celene Kras, MD   100 mcg at 08/21/11 2203  . heparin injection 5,000 Units  5,000 Units Subcutaneous Q8H Shelly Rubenstein, MD   5,000 Units at 08/22/11 [phone]  . hydrALAZINE (APRESOLINE) injection 10 mg  10 mg Intravenous Q4H PRN Elsie [place], MD      . HYDROmorphone (DILAUDID) injection 0.5 mg  0.5 mg Intravenous Q4H PRN Elsie [place], MD      . metoprolol succinate (TOPROL-XL) 24 hr tablet 100 mg  100 mg Oral QHS Elsie [place], MD      . morphine 4 MG/ML injection 4 mg  4 mg Intravenous NOW Shelly Rubenstein, MD   4 mg at 08/21/11 2219  . ondansetron (ZOFRAN) 8 mg/NS 50 ml IVPB  8 mg Intravenous Q6H Shelly Rubenstein, MD   8 mg at 08/22/11 0617  . ondansetron (ZOFRAN) injection 4 mg  4 mg Intravenous Once Celene Kras, MD   4 mg at 08/21/11 1853  . ondansetron (ZOFRAN) injection 4 mg  4 mg Intravenous Once Celene Kras, MD   4 mg at 08/21/11 2203  . polyethylene glycol (MIRALAX / GLYCOLAX) packet 17 g  17 g Oral BID Tobin Chad, MD      . promethazine [facility]) injection 12.5 mg  12.5 mg Intravenous Q6H Elsie [place], MD   12.5 mg at 08/22/11 0211  . promethazine (PHENERGAN) injection 12.5 mg  12.5 mg Intravenous To Minor Shelly Rubenstein, MD   12.5 mg at 08/21/11 2237  . sodium chloride 0.9 % bolus 500 mL  500 mL Intravenous Once Andrena Mews, DO   500 mL at 08/22/11 1037  . DISCONTD: 0.9 %  sodium chloride infusion  1,000 mL Intravenous Continuous Celene Kras, MD 125 mL/hr at 08/22/11 0211 1,000 mL at 08/22/11 0211  . DISCONTD: polyethylene glycol powder (GLYCOLAX/MIRALAX)  container 17 g  17 g Oral BID Andrena Mews, DO      . DISCONTD: sodium chloride 0.9 % bolus 1,000 mL  1,000 mL Intravenous Once Shelly Rubenstein, MD        Allergies as of 08/21/2011 - Review Complete 08/21/2011  Allergen Reaction Noted  . Morphine and related Other (See Comments) 04/04/2011    Family History  Problem Relation Age of Onset  . Hypertension Mother   . Hypertension Father   .  Heart disease Father     CAD  . Diabetes Neg Hx   . Stroke Neg Hx     History   Social History  . Marital Status: Married    Spouse Name: N/A    Number of Children: N/A  . Years of Education: N/A   Occupational History  . Not on file.   Social History Main Topics  . Smoking status: Current Everyday Smoker -- 1.0 packs/day for 10 years    Types: Cigarettes  . Smokeless tobacco: Never Used  . Alcohol Use: 4.2 oz/week    7 Cans of beer per week  . Drug Use: Yes    Special: Marijuana     08/14/11 "last drug use ~ 30 yr ago"  . Sexually Active: Not on file   Other Topics Concern  . Not on file   Social History Narrative   Lives with wife and two children.  Administrator at [employer]    Review of Systems:Pertinent positive and negative review of systems were noted in the above HPI section.  All other review of systems was otherwise negative.    Physical Exam: Vital signs in last 24 hours: Temp:  [97.6 F (36.4 C)-100.1 F (37.8 C)] 97.9 F (36.6 C) (03/12 0530) Pulse Rate:  [63-81] 64  (03/12 0530) Resp:  [15-20] 20  (03/12 0530) BP: (140-192)/(78-114) 145/81 mmHg (03/12 0530) SpO2:  [96 %-100 %] 96 % (03/12 0530) Weight:  [168 lb (76.204 kg)-376 lb 12.3 oz (170.9 kg)] 376 lb 12.3 oz (170.9 kg) (03/12 0000) Last BM Date: 08/19/11 General:   Alert,  Well-developed, well-nourished, pleasant and cooperative in NAD Head:  Normocephalic and atraumatic. Eyes:  Sclera clear, no icterus.   Conjunctiva pink. Ears:  Normal auditory acuity. Nose:  No deformity, discharge,  or  lesions. Mouth:  No deformity or lesions.   Neck:  Supple; no masses or thyromegaly. Lungs:  Clear throughout to auscultation.   No wheezes, crackles, or rhonchi. Heart:  Regular rate and rhythm; no murmurs, clicks, rubs,  or gallops. Abdomen:  Soft,tender epigastrium, no guarding, no mass or hsm, bs+Msk:  Symmetrical without gross deformities. . Pulses:  Normal pulses noted. Extremities:  Without clubbing or edema. Neurologic:  Alert and  oriented x4;  grossly normal neurologically. Skin:  Intact without significant lesions or rashes.. Psych:  Alert and cooperative. Normal mood and affect.  Intake/Output from previous day:   Intake/Output this shift:    Lab Results:  Basename 08/22/11 0525 08/21/11 1720 08/19/11 2146  WBC 5.7 6.7 7.9  HGB 12.2* 15.2 14.2  HCT 35.7* 42.9 40.2  PLT 229 246 211   BMET  Basename 08/22/11 0525 08/21/11 1720 08/19/11 2146  NA 137 136 138  K 3.5 3.9 3.8  CL 107 100 102  CO2 19 23 25   GLUCOSE 77 86 112*  BUN 12 12 10   CREATININE 0.86 1.15 1.12  CALCIUM 8.3* 9.7 9.2   LFT  Basename 08/22/11 0525  PROT 5.7*  ALBUMIN 3.2*  AST 37  ALT 83*  ALKPHOS 79  BILITOT 0.3  BILIDIR --  IBILI --       Studies/Results: Dg Abd Acute W/chest  08/21/2011  *RADIOLOGY REPORT*  Clinical Data: Abdominal pain for 2 weeks  ACUTE ABDOMEN SERIES (ABDOMEN 2 VIEW & CHEST 1 VIEW)  Comparison: 03/15/2010  Findings: Cardiomediastinal silhouette is stable.  No acute infiltrate or pleural effusion.  No pulmonary edema.  There is nonspecific nonobstructive bowel gas pattern.  Some stool noted in  the right colon.  No free abdominal air.  IMPRESSION: No acute disease.  Nonspecific nonobstructive bowel gas pattern. No free abdominal air.  Original Report Authenticated By: Natasha Mead, M.D.    IMPRESSION:  #62 46 year old male with two-week hx  of recurrent epigastric pain and nausea with milder intermittent symptoms over the past couple of years. Patient does have history  of H. pylori induced gastritis and duodenitis 2007. I suspect he has a recurrent gastritis or peptic ulcer disease, also need to rule out gallbladder  disease. #2 hypertension  PLAN: #1 continue twice daily PPI and would discharge this patient home on at least a once daily PPI. #2 schedule for upper endoscopy today with Dr. Rhea Belton, procedure discussed in detail the patient and he is agreeable to proceed #3 if an endoscopy is unrevealing he will need upper abdominal ultrasound #4 patient feels that Donnatal is helpful but does not last long and may want to try him on another anti-spasmodic i.e. Bentyl.   Amy Esterwood  08/22/2011, 10:39 AM

## 2011-08-22 NOTE — H&P (Signed)
I interviewed and examined this patient and discussed the care plan with Dr. Madolyn Frieze and the [facility] team and agree with assessment and plan as documented in the admission note for today. If he truly has had loose stools since childhood lactose deficiency and gluten sensitive enteropathy should be considered.    Sharlize Hoar A. Sheffield Slider, MD Family Medicine Teaching Service Attending  08/22/2011 2:19 PM

## 2011-08-22 NOTE — Consult Note (Signed)
Patient seen, examined, and I agree with the above documentation, including the assessment and plan. Will proceed with EGD today, the nature of the procedure, as well as the risks, benefits, and alternatives were carefully and thoroughly reviewed with the patient. Ample time for discussion and questions allowed. The patient understood, was satisfied, and agreed to proceed.  If neg, will eval GB

## 2011-08-22 NOTE — Progress Notes (Signed)
PCP NOTE I have review Mr. Culbreath information and greatly appreciate the care being provided by the FPTS team.  I was unable to discuss the current situation with the patient, as he was down getting his EGD done when I went to visit with him.  I will continue to follow along with the FPTS to see what, if anything, GI is able to conclude about the patient's abdominal pain.  I am happy to see Mr. Centola in follow up once he is discharged home from the hospital.  [facility] 08/22/2011, 2:41 PM

## 2011-08-22 NOTE — Progress Notes (Signed)
I interviewed and examined this patient and discussed the care plan with Dr. Berline Chough and the [facility] team and agree with assessment and plan as documented in the progress note for today.    Tony Louvier A. Sheffield Slider, MD Family Medicine Teaching Service Attending  08/22/2011 3:55 PM

## 2011-08-23 ENCOUNTER — Ambulatory Visit: Payer: BC Managed Care – PPO | Admitting: Family Medicine

## 2011-08-23 ENCOUNTER — Encounter (HOSPITAL_COMMUNITY): Payer: Self-pay | Admitting: Internal Medicine

## 2011-08-23 ENCOUNTER — Other Ambulatory Visit (HOSPITAL_COMMUNITY): Payer: Self-pay | Admitting: Family Medicine

## 2011-08-23 DIAGNOSIS — K269 Duodenal ulcer, unspecified as acute or chronic, without hemorrhage or perforation: Principal | ICD-10-CM

## 2011-08-23 LAB — H. PYLORI ANTIBODY, IGG: H Pylori IgG: 7.12 {ISR} — ABNORMAL HIGH

## 2011-08-23 MED ORDER — PANTOPRAZOLE SODIUM 40 MG PO TBEC: 40.0000 mg | DELAYED_RELEASE_TABLET | Freq: Two times a day (BID) | ORAL | Status: DC

## 2011-08-23 MED ORDER — HYDROMORPHONE HCL PF 1 MG/ML IJ SOLN
0.5000 mg | Freq: Once | INTRAMUSCULAR | Status: AC
Start: 2011-08-23 — End: 2011-08-23
  Administered 2011-08-23: 0.5 mg via INTRAVENOUS

## 2011-08-23 MED ORDER — LISINOPRIL 10 MG PO TABS
10.0000 mg | ORAL_TABLET | Freq: Every day | ORAL | Status: DC
  Administered 2011-08-23: 10 mg via ORAL
  Filled 2011-08-23 (×2): qty 1

## 2011-08-23 MED ORDER — OXYCODONE HCL 5 MG PO TABS
5.0000 mg | ORAL_TABLET | ORAL | Status: DC | PRN
  Administered 2011-08-23 (×2): 5 mg via ORAL
  Filled 2011-08-23 (×2): qty 1

## 2011-08-23 MED ORDER — HYDROMORPHONE HCL PF 1 MG/ML IJ SOLN
INTRAMUSCULAR | Status: AC
  Administered 2011-08-23: 0.5 mg via INTRAVENOUS
  Filled 2011-08-23: qty 1

## 2011-08-23 MED ORDER — SUCRALFATE 1 GM/10ML PO SUSP: 1.0000 g | Freq: Four times a day (QID) | ORAL | Status: DC

## 2011-08-23 MED ORDER — OXYCODONE HCL 5 MG PO TABS: 5.0000 mg | ORAL_TABLET | Freq: Four times a day (QID) | ORAL | Status: AC | PRN

## 2011-08-23 MED ORDER — GI COCKTAIL ~~LOC~~
30.0000 mL | Freq: Two times a day (BID) | ORAL | Status: DC | PRN
  Administered 2011-08-23: 30 mL via ORAL
  Filled 2011-08-23: qty 30

## 2011-08-23 MED ORDER — HYDROCHLOROTHIAZIDE 25 MG PO TABS: 25.0000 mg | ORAL_TABLET | Freq: Every day | ORAL | Status: DC

## 2011-08-23 NOTE — Discharge Summary (Signed)
Physician Discharge Summary  Patient ID: Tony [name] MRN: [id number] DOB/AGE: 1966-01-27 46 y.o.  Admit date: 08/21/2011 Discharge date: 08/23/2011  Admission Diagnoses: Persistent epigastric abdominal pain with nausea/vomiting  Discharge Diagnoses:  Duodenal ulcer, H. pylori pending  Discharged Condition: stable  Hospital Course: This is a 46 year old African-American male with a history of chronic abdominal pain and a past medical history of duodenitis/gastritis without ulcers in 2007 who was re-admitted for persistent abdominal pain with nausea/vomiting after being discharged on 03/04 for the same. The patient had an outpatient appointment with [facility] who he has seen in the past, however, the patient was admitted due to the patient's intractable symptoms. [facility] was consulted on the patient's admission and duodenal ulcer was found on EGD. The patient was started on high-dose PPI and sucralfate. The patient reported improvement of his symptoms with the sucralfate especially. At the time of discharge, the patient had mild-moderate epigastric pain that was controlled with occasional percocet and was tolerating his diet.   Consults: [facility]  Disposition: 01-Home or Self Care  Discharge Orders    Future Appointments: Provider: Department: Dept Phone: Center:   08/31/2011 2:30 PM Tito Dine, MD [facility] [phone] [facility]   09/20/2011 10:30 AM Beverley Fiedler, MD [facility] Office [phone] [facility]     Medication List  As of 08/23/2011  1:51 PM   ASK your doctor about these medications         acetaminophen 500 MG tablet   Commonly known as: TYLENOL   Take 1,000 mg by mouth every 6 (six) hours as needed. For fever and pain      atropine-PHENObarbital-scopolamine-hyoscyamine 16.2 MG tablet   Commonly known as: DONNATAL   Take 1 tablet by mouth every 6 (six) hours as needed (for abdominal cramping). Take every 6 hours for the  next 3-4 days      HYDROcodone-acetaminophen 5-325 MG per tablet   Commonly known as: NORCO   Take 1 tablet by mouth every 6 (six) hours as needed. For knee pain      metoprolol succinate 100 MG 24 hr tablet   Commonly known as: TOPROL-XL   Take 100 mg by mouth at bedtime. Take with or immediately following a meal.      ondansetron 8 MG disintegrating tablet   Commonly known as: ZOFRAN-ODT   Take 1 tablet (8 mg total) by mouth every 8 (eight) hours as needed. 1 tab on tongue every 8 hours as needed for nausea and vomiting      polyethylene glycol powder powder   Commonly known as: GLYCOLAX/MIRALAX   Take 17 g by mouth 2 (two) times daily. Mix 1 capful in 8 oz of fluid daily as needed for constipation. Dispense generic, 1 large bottle      promethazine 25 MG tablet   Commonly known as: PHENERGAN   Take 25 mg by mouth every 8 (eight) hours as needed.           Follow-up Information    Follow up with [facility], MD on 08/31/2011. (Follow-up with Dr. Fara Boros at 2:30 pm)       Follow up with Beverley Fiedler, MD on 09/20/2011. (at 10;30 am , call for problems; [phone])    Contact information:   [address] [place] [address] [phone]          Signed: Madolyn Frieze, Alfio Loescher 08/23/2011, 1:51 PM

## 2011-08-23 NOTE — Discharge Planning (Signed)
Patient discharged home in stable condition. Verbalizes understanding of all discharge instructions, including home medications and follow up appointments. 

## 2011-08-23 NOTE — Progress Notes (Signed)
Patient seen, examined, and I agree with the above documentation, including the assessment and plan. Some pain after OJ this am.  On BID ppi for DU discovered yesterday at EGD No NSAIDs or ASA. Awaiting H pylori results from gastric BX Will follow with me in office. Call with questions

## 2011-08-23 NOTE — Progress Notes (Signed)
Family Medicine Teaching Service Daily Resident Note   Patient name: Tony [name] Medical record number: [id number] Date of birth: 30-Jul-1965 Age: 46 y.o. Gender: male Length of Stay:  LOS: 2 days             SUBJECTIVE & OVERNIGHT EVENTS  Abdominal pain improved. Dilaudid prn (received 5 doses past 24-hours), sucralfate started yesterday afternoon.  Relieved to have diagnosis for abdominal pain.              OBJECTIVE  Vitals: Patient Vitals for the past 24 hrs:  BP Temp Temp src Pulse Resp SpO2 Weight  08/23/11 0540 168/104 mmHg 96.6 F (35.9 C) Oral 71  20  97 % -  08/22/11 2158 177/93 mmHg 97 F (36.1 C) Oral 64  20  97 % -  08/22/11 1627 154/89 mmHg 97.9 F (36.6 C) Oral 76  18  98 % -  08/22/11 1600 151/94 mmHg - - - 14  99 % -  08/22/11 1550 153/95 mmHg - - - 14  100 % -  08/22/11 1540 150/99 mmHg - - - 13  100 % -  08/22/11 1530 164/97 mmHg - - - 13  100 % -  08/22/11 1520 158/96 mmHg - - - 13  99 % -  08/22/11 1513 156/101 mmHg - - - 19  100 % -  08/22/11 1510 156/101 mmHg - - - 13  99 % -  08/22/11 1500 177/100 mmHg - - - 16  99 % -  08/22/11 1450 159/83 mmHg - - - 15  99 % -  08/22/11 1440 185/107 mmHg - - - 16  100 % -  08/22/11 1359 178/98 mmHg 98.8 F (37.1 C) Oral - 16  98 % -  08/22/11 1030 - - - - - - 170 lb 14.4 oz (77.52 kg)   Wt Readings from Last 3 Encounters:  08/22/11 170 lb 14.4 oz (77.52 kg)  08/22/11 170 lb 14.4 oz (77.52 kg)  08/21/11 168 lb (76.204 kg)    Intake/Output Summary (Last 24 hours) at 08/23/11 0908 Last data filed at 08/22/11 1800  Gross per 24 hour  Intake  692.5 ml  Output    300 ml  Net  392.5 ml    PE: GENERAL: Adult AA male, ambulating to bathroom without problem, minimal occasional abdominal discomfort; no resp distress; alert and appropriate   THORAX:  HEART: RRR, S1/S2 heard, no murmur  LUNGS: CTA B, no wheezes, no crackles  ABDOMEN: +BS, soft, no rigidity, no guarding, no masses/organomegaly. Mild tenderness  over epigastric area/RUQ EXTREMITIES: Moves all 4 extremities spontaneously, warm well perfused, no edema, bilateral DP and PT pulses 2/4.   LABS: Hematology:  Lab 08/22/11 0525 08/21/11 1720 08/19/11 2146  WBC 5.7 6.7 7.9  HGB 12.2* 15.2 14.2  HCT 35.7* 42.9 40.2  PLT 229 246 211  RDW 12.7 12.7 12.4  MCV 89.3 89.2 88.7  MCHC 34.2 35.4 35.3    Lab 08/21/11 1720 08/19/11 2146 08/16/11 1514  LYMPHSABS 2.5 1.6 1.1  EOSABS 0.2 0.6 0.1  BASOSABS 0.0 0.0 0.0   Metabolic:  Lab 08/22/11 0525 08/21/11 1720 08/19/11 2146  NA 137 136 138  K 3.5 3.9 3.8  CL 107 100 102  CO2 19 23 25   BUN 12 12 10   CREATININE 0.86 1.15 1.12  GLUCOSE 77 86 112*  CALCIUM 8.3* 9.7 9.2  MG -- -- --  PHOS -- -- --    Lab 08/22/11 0732 08/22/11  1610 08/21/11 1720 08/17/11 0630  AST -- 37 44* 49*  ALKPHOS -- 79 102 88  BILITOT -- 0.3 0.3 0.4  BILIDIR -- -- -- --  PROT -- 5.7* 7.7 6.1  ALBUMIN -- 3.2* 4.2 3.3*  PREALBUMIN 28.4 -- -- --    Lab 08/16/11 2125  CHOL 162  TRIG 101  HDL 34*    Lab 08/21/11 1720  AMYLASE --  LIPASE 13   MICRO: Urine dipstick shows positive for protein and positive for ketones. Sp Gravity = 1.037 No Cx   IMAGING: 3/11 - CXR - No acute disease. Nonspecific nonobstructive bowel gas pattern. No free abdominal air.  MEDS:    . citalopram  10 mg Oral Daily  . heparin  5,000 Units Subcutaneous Q8H  . metoprolol succinate  100 mg Oral QHS  . ondansetron (ZOFRAN) IV  8 mg Intravenous Q6H  . pantoprazole  40 mg Oral BID AC  . polyethylene glycol  17 g Oral BID  . promethazine  12.5 mg Intravenous Q6H  . sodium chloride  500 mL Intravenous Once  . sucralfate  1 g Oral Q6H  . white petrolatum      . DISCONTD: polyethylene glycol powder  17 g Oral BID  . DISCONTD: sodium chloride  1,000 mL Intravenous Once   gi cocktail, hydrALAZINE, HYDROmorphone (DILAUDID) injection, DISCONTD: butamben-tetracaine-benzocaine, DISCONTD: fentaNYL, DISCONTD: midazolam             ASSESSMENT & PLAN  46 yo with chronic intermittent abdominal pain here 4 day readmission for continued pain and persisting N/V.  HOSP Day#2  1. Abdominal Pain 2/2 duodenal ulcer. History of H. pylori + gastritis/duodenitis in 2007.  -Appreciate [facility] consultation. Will follow-up evaluation for H. pylori from EGD. Stool H. pylori antigen ordered 03/12 but still needs to be collected. -Continue sucralfate, protonix bid -Continue GI cocktail prn, Miralax bid -Discontinue Celexa. Started 03/12 due to concern for IBS.  -D/C Dilaudid. Oxycodone 5 q4 prn.   -On clear diet. Advance per GI.   2. Hypertension--persistently elevated blood pressures, SBP 140-170s. Has been elevated as outpatient but has been coming for abdominal pain; unclear how much pain is contributing.  -Continue metoprolol-XL 100 qd. -Add lisinopril 10 qd  3. Chronic pain: Takes vicodin from orthopedist for chronic knee pain -On oxycodone prn   4. FEN/GI -Does not appear dehydrated today. Will SL IVF.  -Diet: see above  5. PPx -DVT PPx: Heparin SQ -Declines IS            DISPOSITION  Pending clinical improvement.  Consider discharge tomorrow if GI team feels it is appropriate if abdominal pain controlled and tolerating PO.    Shelly Rubenstein, MD [facility] Resident - PGY-1 08/23/2011 9:08 AM

## 2011-08-23 NOTE — Discharge Summary (Signed)
I interviewed and examined this patient and discussed the care plan with Dr. Madolyn Frieze and the [facility] team and agree with assessment and plan as documented in the discharge note for today. Likely he won't need to continue the Donnatol.    Kelisha Dall A. Sheffield Slider, MD Family Medicine Teaching Service Attending  08/23/2011 7:46 PM

## 2011-08-23 NOTE — Progress Notes (Signed)
I interviewed and examined this patient and discussed the care plan with Dr. Madolyn Frieze and the [facility] team and agree with assessment and plan as documented in the progress note for today.    Tony [name] A. Sheffield Slider, MD Family Medicine Teaching Service Attending  08/23/2011 7:43 PM

## 2011-08-23 NOTE — Discharge Instructions (Signed)
New medications: Sucralfate every 6 hours Pantoprazole twice a day  Pain medications: DO NOT TAKE NSAIDs (Advil, ibuprofen, Motrin, aspirin). These medications will irritate your stomach. Take Tylenol 650 mg up to every 8 hours as needed. If you still are very uncomfortable, you may take an oxycodone up to every 4 hours as needed.

## 2011-08-23 NOTE — Progress Notes (Signed)
Patient ID: Tony [name], male   DOB: [birth year], 46 y.o.   MRN: [id number] [facility] Progress Note  Subjective: Still having some epigastric pain, hungry and wants to eat- feels like he could manage at home.  Objective:  Vital signs in last 24 hours: Temp:  [96.6 F (35.9 C)-98.8 F (37.1 C)] 96.6 F (35.9 C) (03/13 0540) Pulse Rate:  [64-76] 71  (03/13 0540) Resp:  [13-20] 20  (03/13 0540) BP: (150-185)/(83-107) 168/104 mmHg (03/13 0540) SpO2:  [97 %-100 %] 97 % (03/13 0540) Weight:  [170 lb 14.4 oz (77.52 kg)] 170 lb 14.4 oz (77.52 kg) (03/12 1030) Last BM Date: 08/19/11 General:   Alert,  Well-developed,    in NAD Heart:  Regular rate and rhythm; no murmurs Pulm;clear Abdomen:  Soft,mild  tenderness in epigastriumr nondistended. Normal bowel sounds, Extremities:  Without edema. Neurologic:  Alert and  oriented x4;  grossly normal neurologically. Psych:  Alert and cooperative. Normal mood and affect.  Intake/Output from previous day: 03/12 0701 - 03/13 0700 In: 692.5 [P.O.:120; I.V.:572.5] Out: 300 [Urine:300] Intake/Output this shift: Total I/O In: 360 [P.O.:360] Out: -   Lab Results:  Basename 08/22/11 0525 08/21/11 1720  WBC 5.7 6.7  HGB 12.2* 15.2  HCT 35.7* 42.9  PLT 229 246   BMET  Basename 08/22/11 0525 08/21/11 1720  NA 137 136  K 3.5 3.9  CL 107 100  CO2 19 23  GLUCOSE 77 86  BUN 12 12  CREATININE 0.86 1.15  CALCIUM 8.3* 9.7   LFT  Basename 08/22/11 0525  PROT 5.7*  ALBUMIN 3.2*  AST 37  ALT 83*  ALKPHOS 79  BILITOT 0.3  BILIDIR --  IBILI --     Assessment / Plan: 46 yo male with epigastric pain secondary to duodenal ulcer , nonbleeding. He is stable for discharge home today, advance to bland diet. Home on Bid  protonix for at least one month then daily He may need an analgesic for  A few days, no asa/nsaids We will f/u on Hpylori bx  He will follow up in office with Dr.Pyrtle on 09/20/11 at 10:30 am Active  Problems:  * No active hospital problems. *      LOS: 2 days   Blu Mcglaun  08/23/2011, 9:56 AM

## 2011-08-23 NOTE — Discharge Summary (Signed)
Family Medicine Teaching Service  Discharge Note : Attending Charlette Hennings MD Pager [phone] Office [phone] I have seen and examined this patient, reviewed their chart and discussed discharge planning wit the resident at the time of discharge. I agree with the discharge plan as above.  

## 2011-08-23 NOTE — Telephone Encounter (Signed)
Refill request

## 2011-08-25 LAB — H.PYLORI ANTIGEN, STOOL: H. pylori ag, stool: POSITIVE

## 2011-08-29 ENCOUNTER — Encounter: Payer: Self-pay | Admitting: Internal Medicine

## 2011-08-31 ENCOUNTER — Ambulatory Visit: Payer: BC Managed Care – PPO | Admitting: Family Medicine

## 2011-08-31 ENCOUNTER — Telehealth: Payer: Self-pay | Admitting: *Deleted

## 2011-08-31 MED ORDER — AMOXICILL-CLARITHRO-LANSOPRAZ PO MISC: Freq: Two times a day (BID) | ORAL | Status: AC

## 2011-08-31 NOTE — Telephone Encounter (Signed)
Letter from: Beverley Fiedler   Reason for Letter: Results Review   Comments: H. pylori positive   Can you please contact the patient, and let him know he is H. pylori positive. He needs triple therapy. I saw him in the hospital last week. I believe he already has follow up with me      Notified pt of + H. Pylori and need for antibiotics. Pt stated understanding and will f/u on 09/20/11.

## 2011-08-31 NOTE — Telephone Encounter (Signed)
Notified pt of + H.Pylori and the need for antibiotic therapy. Pt will f/u with Dr Rhea Belton on 09/20/11.

## 2011-09-04 ENCOUNTER — Telehealth: Payer: Self-pay | Admitting: Internal Medicine

## 2011-09-04 MED ORDER — PROMETHAZINE HCL 12.5 MG PO TABS: ORAL_TABLET | ORAL | Status: DC

## 2011-09-04 NOTE — Telephone Encounter (Signed)
Notified pt Dr Rhea Belton ordered Phenergan along with the zofran for nausea; cautioned him about drowsiness. Pt has put in a refill for the carafate. He is taking his PPI BID; informed him his excuse is at the front desk. Pt stated understanding. He called and the Prevpak is also ready.

## 2011-09-04 NOTE — Telephone Encounter (Signed)
Can resume the sucralfate if this was helping with nausea, if it wasn't that he can be given Phenergan 12.5 mg every 6 hours when necessary which can be used with the Zofran as needed Twice a day PPI Work to get the H. pylori meds and Ensure he completes this therapy

## 2011-09-04 NOTE — Telephone Encounter (Signed)
Pt needs a note for work. He reports he finished the carafate and he is still taking Protonix. The pain comes and goes, but that's not really a problem. He has a new onset of nausea he states that started Saturday; he is using Zofran ODT, but is still having problems. The Prev pak is not in at the pharmacy; hopefully it will come in today. He is having soft stools. Can pt have another anti emetic to try? Thanks.

## 2011-09-06 ENCOUNTER — Ambulatory Visit: Payer: BC Managed Care – PPO | Admitting: Gastroenterology

## 2011-09-14 ENCOUNTER — Encounter: Payer: Self-pay | Admitting: Internal Medicine

## 2011-09-20 ENCOUNTER — Ambulatory Visit: Payer: BC Managed Care – PPO | Admitting: Internal Medicine

## 2011-10-10 ENCOUNTER — Encounter: Payer: Self-pay | Admitting: Internal Medicine

## 2011-10-11 ENCOUNTER — Encounter: Payer: Self-pay | Admitting: Internal Medicine

## 2011-10-11 ENCOUNTER — Ambulatory Visit (INDEPENDENT_AMBULATORY_CARE_PROVIDER_SITE_OTHER): Payer: BC Managed Care – PPO | Admitting: Internal Medicine

## 2011-10-11 VITALS — BP 150/90 | HR 84 | Ht 72.0 in | Wt 177.0 lb

## 2011-10-11 DIAGNOSIS — K269 Duodenal ulcer, unspecified as acute or chronic, without hemorrhage or perforation: Secondary | ICD-10-CM

## 2011-10-11 DIAGNOSIS — L98499 Non-pressure chronic ulcer of skin of other sites with unspecified severity: Secondary | ICD-10-CM

## 2011-10-11 DIAGNOSIS — IMO0002 Reserved for concepts with insufficient information to code with codable children: Secondary | ICD-10-CM

## 2011-10-11 DIAGNOSIS — R1013 Epigastric pain: Secondary | ICD-10-CM

## 2011-10-11 DIAGNOSIS — A048 Other specified bacterial intestinal infections: Secondary | ICD-10-CM | POA: Insufficient documentation

## 2011-10-11 MED ORDER — SUCRALFATE 1 GM/10ML PO SUSP: 1.0000 g | Freq: Four times a day (QID) | ORAL | Status: DC

## 2011-10-11 NOTE — Patient Instructions (Addendum)
You have been given a separate informational sheet regarding your tobacco use, the importance of quitting and local resources to help you quit.  You have been scheduled for an endoscopy with propofol. Please follow written instructions given to you at your visit today.  We have sent the following medications to your pharmacy for you to pick up at your convenience: carafate; please take as directed.  Moviprep; you were given instructions today at your visit.

## 2011-10-11 NOTE — Progress Notes (Signed)
Subjective:    Patient ID: Tony [name], male    DOB: Jan 21, 1966, 46 y.o.   MRN: [id number]  HPI Tony [name] is a 46 yo male with PMH of HTN, GERD, PUD who is seen in hospital follow-up after being admitted with duodenal ulcer, epigastric abdominal pain, nausea and vomiting. The patient underwent upper endoscopy on 08/22/2011 which revealed normal esophagus, mild antral gastritis biopsies positive for H. pylori infection, and a deep duodenal bulb ulceration without visible vessel. He was started on twice a day pantoprazole, given a prescription for sucralfate, and triple therapy for H. pylori. He completed the H. pylori therapy as directed, and took the sucralfate until the prescription ran out. He remains on pantoprazole 40 mg twice daily. He reports ongoing epigastric abdominal pain, at times this is severe and causes him to "double over". This can radiate across his upper abdomen at times. When it is most severe it tends to last approximately an hour. He does not necessarily relate this pain to food. He does have associated nausea without vomiting. He has strictly avoided NSAIDs. He has not had blood in his stool or melena. He does have an appetite and is able to eat. His stools have been loose to soft during his ulcer treatment, and he is having 2 loose stools daily. No fevers or chills.  Review of Systems As per HPI, otherwise negative  Past Medical History  Diagnosis Date  . Hypertension   . GERD (gastroesophageal reflux disease)   . Gastric ulcer due to Helicobacter pylori 2008  . Pneumonia 2011  . Mesenteric adenitis 2011    presumed/H&P  . IBS (irritable bowel syndrome)    Current Outpatient Prescriptions  Medication Sig Dispense Refill  . acetaminophen (TYLENOL) 500 MG tablet Take 1,000 mg by mouth every 6 (six) hours as needed. For fever and pain      . hydrochlorothiazide (HYDRODIURIL) 25 MG tablet TAKE 1 TABLET BY MOUTH EVERY DAY  90 tablet  0  . metoprolol succinate  (TOPROL-XL) 100 MG 24 hr tablet Take 100 mg by mouth at bedtime. Take with or immediately following a meal.      . ondansetron (ZOFRAN ODT) 8 MG disintegrating tablet Take 1 tablet (8 mg total) by mouth every 8 (eight) hours as needed. 1 tab on tongue every 8 hours as needed for nausea and vomiting  30 tablet  0  . pantoprazole (PROTONIX) 40 MG tablet Take 1 tablet (40 mg total) by mouth 2 (two) times daily.  60 tablet  0  . promethazine (PHENERGAN) 12.5 MG tablet Take 1 tab by mouth every 6 hours when needed for nausea; may take with Zofran.  30 tablet  0  . sucralfate (CARAFATE) 1 GM/10ML suspension Take 10 mLs (1 g total) by mouth every 6 (six) hours.  420 mL  1  . sucralfate (CARAFATE) 1 GM/10ML suspension Take 10 mLs (1 g total) by mouth 4 (four) times daily.  420 mL  1  . DISCONTD: atropine-PHENObarbital-scopolamine-hyoscyamine (DONNATAL) 16.2 MG tablet Take 1 tablet by mouth every 6 (six) hours as needed (for abdominal cramping). Take every 6 hours for the next 3-4 days  120 tablet  0   Allergies  Allergen Reactions  . Morphine And Related Other (See Comments)    hallucinations   Family History  Problem Relation Age of Onset  . Hypertension Mother   . Hypertension Father   . Heart disease Father     CAD  . Diabetes Neg Hx   .  Stroke Neg Hx    History  Substance Use Topics  . Smoking status: Current Everyday Smoker -- 1.0 packs/day for 10 years    Types: Cigarettes  . Smokeless tobacco: Never Used  . Alcohol Use: 4.2 oz/week    7 Cans of beer per week     beer      Objective:   Physical Exam BP 150/90  Pulse 84  Ht 6' (1.829 m)  Wt 177 lb (80.287 kg)  BMI 24.01 kg/m2 Constitutional: Well-developed and well-nourished. No distress. HEENT: Normocephalic and atraumatic. Oropharynx is clear and moist. No oropharyngeal exudate. Conjunctivae are normal. Pupils are equal round and reactive to light. No scleral icterus. Neck: Neck supple. Trachea midline. Cardiovascular: Normal  rate, regular rhythm and intact distal pulses. No M/R/G Pulmonary/chest: Effort normal and breath sounds normal. No wheezing, rales or rhonchi. Abdominal: Soft, mild epigastric tenderness without rebound or guarding, nondistended. Bowel sounds active throughout. There are no masses palpable. No hepatosplenomegaly. Extremities: no clubbing, cyanosis, or edema Lymphadenopathy: No cervical adenopathy noted. Neurological: Alert and oriented to person place and time. Skin: Skin is warm and dry. No rashes noted. Psychiatric: Normal mood and affect. Behavior is normal.     Assessment & Plan:  46 yo male with PMH of HTN, GERD, PUD who is seen in hospital follow-up after being admitted with duodenal ulcer, epigastric abdominal pain, nausea and vomiting.   1. Duodenal ulcer/H. pylori associated gastroduodenitis -- the patient's symptoms have improved slightly, but have not resolved. He continues to have epigastric abdominal pain with nausea. He did complete H. pylori therapy as directed. At this point, the most likely etiology to his ongoing symptoms it is persistent duodenal ulceration. I recommended a repeat upper endoscopy to evaluate this ulceration. At that time we can perform gastric biopsies to confirm H. pylori eradication. In the interim I recommended that he continue pantoprazole 40 mg twice daily. I'll represcribe sucralfate 1 g 4 times a day. He is again reminded to avoid NSAIDs. If this ulcer remains persistent, and nonhealing that he may require surgical referral. He is asked to let us know immediately should he develop worsening pain, hematemesis or melena.

## 2011-10-24 ENCOUNTER — Encounter: Payer: Self-pay | Admitting: Internal Medicine

## 2011-10-24 ENCOUNTER — Ambulatory Visit (AMBULATORY_SURGERY_CENTER): Payer: BC Managed Care – PPO | Admitting: Internal Medicine

## 2011-10-24 VITALS — BP 146/103 | HR 93 | Temp 96.4°F | Resp 16 | Ht 72.0 in | Wt 177.0 lb

## 2011-10-24 DIAGNOSIS — R1013 Epigastric pain: Secondary | ICD-10-CM

## 2011-10-24 DIAGNOSIS — K2970 Gastritis, unspecified, without bleeding: Secondary | ICD-10-CM

## 2011-10-24 DIAGNOSIS — K269 Duodenal ulcer, unspecified as acute or chronic, without hemorrhage or perforation: Secondary | ICD-10-CM

## 2011-10-24 DIAGNOSIS — K2990 Gastroduodenitis, unspecified, without bleeding: Secondary | ICD-10-CM

## 2011-10-24 DIAGNOSIS — A048 Other specified bacterial intestinal infections: Secondary | ICD-10-CM

## 2011-10-24 MED ORDER — SODIUM CHLORIDE 0.9 % IV SOLN: 4.0000 mg | Freq: Once | INTRAVENOUS | Status: DC

## 2011-10-24 MED ORDER — SODIUM CHLORIDE 0.9 % IV SOLN: 500.0000 mL | INTRAVENOUS | Status: DC

## 2011-10-24 NOTE — Progress Notes (Signed)
Patient did not experience any of the following events: a burn prior to discharge; a fall within the facility; wrong site/side/patient/procedure/implant event; or a hospital transfer or hospital admission upon discharge from the facility. (G8907) Patient did not have preoperative order for IV antibiotic SSI prophylaxis. (G8918)  

## 2011-10-24 NOTE — Patient Instructions (Addendum)
See Endoscopy Report for Findings and Recommendations.  AVOID ASPIRIN CONTAINING PRODUCTS.  YOU HAD AN ENDOSCOPIC PROCEDURE TODAY AT THE [facility]: Refer to the procedure report that was given to you for any specific questions about what was found during the examination.  If the procedure report does not answer your questions, please call your gastroenterologist to clarify.  If you requested that your care partner not be given the details of your procedure findings, then the procedure report has been included in a sealed envelope for you to review at your convenience later.  YOU SHOULD EXPECT: Some feelings of bloating in the abdomen. Passage of more gas than usual.  Walking can help get rid of the air that was put into your GI tract during the procedure and reduce the bloating. If you had a lower endoscopy (such as a colonoscopy or flexible sigmoidoscopy) you may notice spotting of blood in your stool or on the toilet paper. If you underwent a bowel prep for your procedure, then you may not have a normal bowel movement for a few days.  DIET: Your first meal following the procedure should be a light meal and then it is ok to progress to your normal diet.  A half-sandwich or bowl of soup is an example of a good first meal.  Heavy or fried foods are harder to digest and may make you feel nauseous or bloated.  Likewise meals heavy in dairy and vegetables can cause extra gas to form and this can also increase the bloating.  Drink plenty of fluids but you should avoid alcoholic beverages for 24 hours.  ACTIVITY: Your care partner should take you home directly after the procedure.  You should plan to take it easy, moving slowly for the rest of the day.  You can resume normal activity the day after the procedure however you should NOT DRIVE or use heavy machinery for 24 hours (because of the sedation medicines used during the test).    SYMPTOMS TO REPORT IMMEDIATELY: A gastroenterologist can be  reached at any hour.  During normal business hours, 8:30 AM to 5:00 PM Monday through Friday, call [phone].  After hours and on weekends, please call the GI answering service at [phone] who will take a message and have the physician on call contact you.   Following lower endoscopy (colonoscopy or flexible sigmoidoscopy):  Excessive amounts of blood in the stool  Significant tenderness or worsening of abdominal pains  Swelling of the abdomen that is new, acute  Fever of 100F or higher  Following upper endoscopy (EGD)  Vomiting of blood or coffee ground material  New chest pain or pain under the shoulder blades  Painful or persistently difficult swallowing  New shortness of breath  Fever of 100F or higher  Black, tarry-looking stools  FOLLOW UP: If any biopsies were taken you will be contacted by phone or by letter within the next 1-3 weeks.  Call your gastroenterologist if you have not heard about the biopsies in 3 weeks.  Our staff will call the home number listed on your records the next business day following your procedure to check on you and address any questions or concerns that you may have at that time regarding the information given to you following your procedure. This is a courtesy call and so if there is no answer at the home number and we have not heard from you through the emergency physician on call, we will assume that you have returned  to your regular daily activities without incident.  SIGNATURES/CONFIDENTIALITY: You and/or your care partner have signed paperwork which will be entered into your electronic medical record.  These signatures attest to the fact that that the information above on your After Visit Summary has been reviewed and is understood.  Full responsibility of the confidentiality of this discharge information lies with you and/or your care-partner.   Please follow all discharge instructions given to you by the recovery room nurse. If you have  any questions or problems after discharge please call one of the numbers listed above. You will receive a phone call in the am to see how you are doing and answer any questions you may have. Thank you for choosing [facility] for your health care needs.

## 2011-10-24 NOTE — Op Note (Signed)
[facility] [address] [place], [place]  [address]  ENDOSCOPY PROCEDURE REPORT  PATIENT:  Tony [name], Tony [name]  MR#:  [id number] BIRTHDATE:  1965-11-18, 45 yrs. old  GENDER:  male ENDOSCOPIST:  Carie Caddy. Aeneas Longsworth, MD  PROCEDURE DATE:  10/24/2011 PROCEDURE:  EGD with biopsy for H. pylori 62130 ASA CLASS:  Class II INDICATIONS:  epigastric pain, follow-up of duodenal ulcer, history of MEDICATIONS:   MAC sedation, administered by CRNA, propofol (Diprivan) 420 mg IV TOPICAL ANESTHETIC:  none  DESCRIPTION OF PROCEDURE:   After the risks benefits and alternatives of the procedure were thoroughly explained, informed consent was obtained.  The LB [id number] endoscope was introduced through the mouth and advanced to the second portion of the duodenum, without limitations.  The instrument was slowly withdrawn as the mucosa was fully examined. <<PROCEDUREIMAGES>>  The esophagus and gastroesophageal junction were completely normal in appearance.  Moderate gastritis with erythema and scattered erosions was found in the body and the antrum of the stomach. Biopsies of the antrum and body of the stomach were obtained and sent to pathology.  Mild duodenitis was found in the bulb of the duodenum.  The previous seen ulceration in the bulb of the duodenum has healed.   Retroflexed views revealed no abnormalities.    The scope was then withdrawn from the patient and the procedure completed.  COMPLICATIONS:  None  ENDOSCOPIC IMPRESSION: 1) Normal esophagus 2) Moderate gastritis in the body and the antrum of the stomach. Multiple biopsies obtained and sent to pathology. 3) Mild ongoing duodenitis in the bulb of duodenum 4) Healed ulcer in the bulb of duodenum  RECOMMENDATIONS: 1) Await pathology results 2) Avoid NSAIDs 3) Continue current medications 4) Follow-up of helicobacter pylori status, treat if indicated 5) Office followed as needed.  Carie Caddy. Rhea Belton, MD  CC:  The  Patient [name], MD  n. Rosalie DoctorCarie Caddy. Demontrez Rindfleisch at 10/24/2011 11:14 AM  Tony [name], [phone]

## 2011-10-25 ENCOUNTER — Telehealth: Payer: Self-pay | Admitting: *Deleted

## 2011-10-25 NOTE — Telephone Encounter (Signed)
Left message to call office if questions or concerns. 

## 2011-11-01 ENCOUNTER — Encounter: Payer: Self-pay | Admitting: Internal Medicine

## 2012-02-06 ENCOUNTER — Other Ambulatory Visit: Payer: Self-pay | Admitting: Family Medicine

## 2012-03-06 ENCOUNTER — Telehealth: Payer: Self-pay | Admitting: Internal Medicine

## 2012-03-06 ENCOUNTER — Telehealth: Payer: Self-pay | Admitting: Endocrinology

## 2012-03-06 NOTE — Telephone Encounter (Signed)
This is not a [place]/dr. Amador Cunas - please forward

## 2012-03-06 NOTE — Telephone Encounter (Signed)
Please advise if needs to be seen today. Thanks.

## 2012-03-06 NOTE — Telephone Encounter (Signed)
Patient calling, has had nausea/vomiting and diarrhea since Saturday 9/21.  Having some abdominal pain that is intermittent but sharp pains.  Also c/o fatigue.  Has chills but hasn't checked his temperature.  Had H-pylori several months ago and these symptoms are the same as then.   Triaged per Abdominal Pain, needs to be seen.  No appts. available.  Please call patient to work in appt.

## 2012-03-07 ENCOUNTER — Encounter: Payer: Self-pay | Admitting: Internal Medicine

## 2012-03-11 ENCOUNTER — Ambulatory Visit: Payer: BC Managed Care – PPO | Admitting: Internal Medicine

## 2014-02-14 ENCOUNTER — Encounter: Payer: Self-pay | Admitting: Gastroenterology

## 2015-08-13 ENCOUNTER — Other Ambulatory Visit: Payer: Self-pay | Admitting: Family Medicine

## 2015-08-13 DIAGNOSIS — R1013 Epigastric pain: Secondary | ICD-10-CM

## 2015-08-16 ENCOUNTER — Encounter: Payer: Self-pay | Admitting: Physician Assistant

## 2015-08-16 ENCOUNTER — Ambulatory Visit
Admission: RE | Admit: 2015-08-16 | Discharge: 2015-08-16 | Disposition: A | Discharge status: Home / Self Care | Payer: BC Managed Care – PPO | Source: Ambulatory Visit | Attending: Family Medicine | Admitting: Family Medicine

## 2015-08-16 ENCOUNTER — Ambulatory Visit (INDEPENDENT_AMBULATORY_CARE_PROVIDER_SITE_OTHER): Payer: BC Managed Care – PPO | Admitting: Physician Assistant

## 2015-08-16 ENCOUNTER — Telehealth: Payer: Self-pay | Admitting: Internal Medicine

## 2015-08-16 ENCOUNTER — Other Ambulatory Visit (INDEPENDENT_AMBULATORY_CARE_PROVIDER_SITE_OTHER): Payer: BC Managed Care – PPO

## 2015-08-16 VITALS — BP 152/90 | HR 86 | Wt 187.0 lb

## 2015-08-16 DIAGNOSIS — R945 Abnormal results of liver function studies: Secondary | ICD-10-CM

## 2015-08-16 DIAGNOSIS — R112 Nausea with vomiting, unspecified: Secondary | ICD-10-CM

## 2015-08-16 DIAGNOSIS — R1013 Epigastric pain: Secondary | ICD-10-CM

## 2015-08-16 DIAGNOSIS — K5909 Other constipation: Secondary | ICD-10-CM

## 2015-08-16 DIAGNOSIS — R7989 Other specified abnormal findings of blood chemistry: Secondary | ICD-10-CM

## 2015-08-16 LAB — HEPATIC FUNCTION PANEL
ALK PHOS: 79 U/L (ref 39–117)
ALT: 78 U/L — ABNORMAL HIGH (ref 0–53)
AST: 31 U/L (ref 0–37)
Albumin: 4.3 g/dL (ref 3.5–5.2)
BILIRUBIN DIRECT: 0.1 mg/dL (ref 0.0–0.3)
BILIRUBIN TOTAL: 0.5 mg/dL (ref 0.2–1.2)
TOTAL PROTEIN: 6.6 g/dL (ref 6.0–8.3)

## 2015-08-16 LAB — CBC WITH DIFFERENTIAL/PLATELET
BASOS PCT: 0.5 % (ref 0.0–3.0)
Basophils Absolute: 0 10*3/uL (ref 0.0–0.1)
EOS PCT: 8.4 % — AB (ref 0.0–5.0)
Eosinophils Absolute: 0.7 10*3/uL (ref 0.0–0.7)
HCT: 40.3 % (ref 39.0–52.0)
HEMOGLOBIN: 13.6 g/dL (ref 13.0–17.0)
LYMPHS ABS: 1.8 10*3/uL (ref 0.7–4.0)
Lymphocytes Relative: 22.8 % (ref 12.0–46.0)
MCHC: 33.8 g/dL (ref 30.0–36.0)
MCV: 91.5 fl (ref 78.0–100.0)
MONO ABS: 0.6 10*3/uL (ref 0.1–1.0)
MONOS PCT: 8.2 % (ref 3.0–12.0)
NEUTROS PCT: 60.1 % (ref 43.0–77.0)
Neutro Abs: 4.7 10*3/uL (ref 1.4–7.7)
Platelets: 272 10*3/uL (ref 150.0–400.0)
RBC: 4.41 Mil/uL (ref 4.22–5.81)
RDW: 13.4 % (ref 11.5–15.5)
WBC: 7.9 10*3/uL (ref 4.0–10.5)

## 2015-08-16 MED ORDER — PANTOPRAZOLE SODIUM 40 MG PO TBEC: 40.0000 mg | DELAYED_RELEASE_TABLET | Freq: Two times a day (BID) | ORAL | Status: DC

## 2015-08-16 MED ORDER — ONDANSETRON HCL 4 MG PO TABS: ORAL_TABLET | ORAL | Status: DC

## 2015-08-16 MED ORDER — SUCRALFATE 1 GM/10ML PO SUSP
1.0000 g | Freq: Three times a day (TID) | ORAL | Status: DC
Start: 2015-08-16 — End: 2017-11-13

## 2015-08-16 NOTE — Progress Notes (Addendum)
Patient ID: Tony [name], male   DOB: Jul 11, 1965, 50 y.o.   MRN: [id number]   Subjective:    Patient ID: Tony [name], male    DOB: 12-08-1965, 50 y.o.   MRN: [id number]  HPI  Tony [name]  is a 50 year old African-American male known to Dr.Pyrtle with history of a deep duodenal ulcer diagnosed in 2013. This was followed to healing. Patient also had H. pylori and was treated with a Prevpac. We have not seen him since that time he comes in today with complaints of 1 week history of upper abdominal pain nausea cramping and bloating with intermittent vomiting. He says all of these symptoms are very similar to when he had the ulcer in 2013. He has not been on PPI therapy but denies any aspirin or NSAID use. Says he had become constipated last week when the symptoms started as well and has taken several laxatives. He had not had a bowel movement for 3 or 4 days and is now just having small pieces of stool. He has been keeping liquids down but his wife says that he has been retching off and on. He was seen at [facility] primary care 3 days ago and labs showed a WBC of 6.9 hemoglobin of 14 electrolytes within normal limits, LFTs minimally elevated with total bilirubin of 0.6 alkaline phosphatase 98 AST of 43 and ALT of 123, UA was negative lipase 18. He underwent upper abdominal ultrasound which showed no gallstones and a common bile duct of 3.4 mm. He has not had any prior abdominal surgery  Review of Systems  Pertinent positive and negative review of systems were noted in the above HPI section.  All other review of systems was otherwise negative.  Outpatient Encounter Prescriptions as of 08/16/2015  Medication Sig  . atorvastatin (LIPITOR) 10 MG tablet Take 10 mg by mouth daily.  . hydrochlorothiazide (HYDRODIURIL) 25 MG tablet TK 1 T PO D  . metoprolol succinate (TOPROL-XL) 100 MG 24 hr tablet Take 100 mg by mouth at bedtime. Take with or immediately following a meal.  . ondansetron (ZOFRAN) 4 MG tablet Take 1  tablet every 6 hours as needed for nausea.  . pantoprazole (PROTONIX) 40 MG tablet Take 1 tablet (40 mg total) by mouth 2 (two) times daily.  . sucralfate (CARAFATE) 1 GM/10ML suspension Take 10 mLs (1 g total) by mouth 4 (four) times daily -  with meals and at bedtime.  . [DISCONTINUED] acetaminophen (TYLENOL) 500 MG tablet Take 1,000 mg by mouth every 6 (six) hours as needed. For fever and pain  . [DISCONTINUED] hydrochlorothiazide (HYDRODIURIL) 25 MG tablet TAKE 1 TABLET BY MOUTH EVERY DAY  . [DISCONTINUED] ondansetron (ZOFRAN ODT) 8 MG disintegrating tablet Take 1 tablet (8 mg total) by mouth every 8 (eight) hours as needed. 1 tab on tongue every 8 hours as needed for nausea and vomiting  . [DISCONTINUED] promethazine (PHENERGAN) 12.5 MG tablet Take 1 tab by mouth every 6 hours when needed for nausea; may take with Zofran.  . [DISCONTINUED] sucralfate (CARAFATE) 1 GM/10ML suspension Take 10 mLs (1 g total) by mouth every 6 (six) hours.  . [DISCONTINUED] sucralfate (CARAFATE) 1 GM/10ML suspension Take 10 mLs (1 g total) by mouth 4 (four) times daily.  . [DISCONTINUED] ondansetron (ZOFRAN) 4 mg in sodium chloride 0.9 % 50 mL IVPB    No facility-administered encounter medications on file as of 08/16/2015.   Allergies  Allergen Reactions  . Morphine And Related Other (See Comments)  hallucinations   Patient Active Problem List   Diagnosis Date Noted  . H. pylori infection 10/11/2011  . Duodenal ulcer 08/23/2011  . IBS (irritable bowel syndrome) 08/18/2011  . DEPRESSION 05/11/2009  . EPIGASTRIC PAIN, CHRONIC 05/11/2009  . SMOKER 09/01/2008  . INSOMNIA, CHRONIC 09/01/2008  . ESSENTIAL HYPERTENSION 09/01/2008  . PALPITATIONS, CHRONIC 09/01/2008  . GASTRITIS 10/24/2007   Social History   Social History  . Marital Status: Married    Spouse Name: N/A  . Number of Children: 3  . Years of Education: N/A   Occupational History  . 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  Smoking status: Current Every Day Smoker -- 1.00 packs/day for 10 years    Types: Cigarettes  . Smokeless tobacco: Never Used  . Alcohol Use: 4.2 oz/week    7 Cans of beer per week     Comment: beer  . Drug Use: No     Comment: 08/14/11 "last drug use ~ 30 yr ago"  . Sexual Activity: Not on file   Other Topics Concern  . Not on file   Social History Narrative   Lives with wife and two children.  Administrator at [employer]    Mr. Mattern family history includes Heart disease in his father; Hypertension in his father and mother. There is no history of Diabetes or Stroke.      Objective:    Filed Vitals:   08/16/15 1443  BP: 152/90  Pulse: 86    Physical Exam   well-developed African-American male curled up on the examining table, vomited small amount twice during exam, accompanied by his wife blood pressure 152/90 pulse 86 weight 187. HEENT nontraumatic normocephalic EOMI PERRLA sclera anicteric, Cardiovascular; regular rate and rhythm with S1-S2 no murmur rub or gallop, Pulmonary; clear bilaterally, Abdomen ;with patient distracted abdomen is soft he is tender in the epigastrium there is no guarding or rebound no distention no palpable mass or hepatosplenomegaly bowel sounds are present on Rectal ;exam not done, Extremities; no clubbing cyanosis or edema skin warm and dry, Neuropsych; mood and affect appropriate     Assessment & Plan:   # 1  50 yo AA Male with 1 week history of upper abdominal pain and cramping bloating nausea and vomiting all very reminiscent of previous ulcer symptoms for this patient. Rule out gastroenteritis, rule out recurrent peptic ulcer disease #2 constipation associated with above #3 history of deep duodenal ulcer 2013, H. pylori positive and treated #4 hypertension  Plan: Repeat CBC and hepatic panel today Start Protonix  40 mg by mouth twice a day Start Zofran 4 mg every 6 hours when necessary for nausea Start Carafate liquid 1 g between meals and at  bedtime Start MiraLAX 17 g in 8 ounces of water daily short-term H. pylori stool antigen Schedule  for EGD with Dr.Pyrtle. Procedure discussed in detail with patient and his wife and they're agreeable to proceed Note for work for patient to remain out of work for the remainder of the week. If EGD negative will need CT abdomen and pelvis   Kimbely Whiteaker S Kendrea Cerritos PA-C 08/16/2015   Cc: No ref. provider found  Addendum: Reviewed and agree with initial management. [name], MD

## 2015-08-16 NOTE — Telephone Encounter (Signed)
Pts wife states pt is having the same symptoms he had in the past when he had h pylori. States he is having nausea and vomiting along with abdominal pain and cramping. Pt scheduled to see Nicoletta Ba PA today at 2:30pm. Pt aware of appt.

## 2015-08-16 NOTE — Patient Instructions (Addendum)
Please go to the basement level to have your labs drawn and stool study. IF you have to do the stool study at home, please freeze it and bring it on ice when you bring it back to our lab, basement level.   We sent prescriptions to [place], [place] and [place]  1. Zofran ( Ondansetron ) 4 mg. For nausea. 2. Pantoprazole sodium 40 mg , take 1 tab twice daily.  3.Carafate Liquid  Take Miralax, 17 grams in 8 oz of water daily for your bowels.   You have been scheduled for an endoscopy. Please follow written instructions given to you at your visit today. If you use inhalers (even only as needed), please bring them with you on the day of your procedure. Your physician has requested that you go to www.startemmi.com and enter the access code given to you at your visit today. This web site gives a general overview about your procedure. However, you should still follow specific instructions given to you by our office regarding your preparation for the procedure.

## 2015-08-17 ENCOUNTER — Other Ambulatory Visit: Payer: Self-pay

## 2015-08-17 DIAGNOSIS — R7989 Other specified abnormal findings of blood chemistry: Secondary | ICD-10-CM

## 2015-08-17 DIAGNOSIS — R945 Abnormal results of liver function studies: Principal | ICD-10-CM

## 2015-08-19 ENCOUNTER — Other Ambulatory Visit: Payer: BC Managed Care – PPO

## 2015-08-19 ENCOUNTER — Ambulatory Visit (AMBULATORY_SURGERY_CENTER): Payer: BC Managed Care – PPO | Admitting: Internal Medicine

## 2015-08-19 ENCOUNTER — Encounter: Payer: Self-pay | Admitting: Internal Medicine

## 2015-08-19 VITALS — BP 119/74 | HR 72 | Temp 97.3°F | Resp 15 | Ht 72.0 in | Wt 187.0 lb

## 2015-08-19 DIAGNOSIS — R1013 Epigastric pain: Secondary | ICD-10-CM

## 2015-08-19 DIAGNOSIS — R112 Nausea with vomiting, unspecified: Secondary | ICD-10-CM

## 2015-08-19 DIAGNOSIS — K5909 Other constipation: Secondary | ICD-10-CM

## 2015-08-19 DIAGNOSIS — K2950 Unspecified chronic gastritis without bleeding: Secondary | ICD-10-CM | POA: Diagnosis not present

## 2015-08-19 DIAGNOSIS — Z8711 Personal history of peptic ulcer disease: Secondary | ICD-10-CM | POA: Diagnosis not present

## 2015-08-19 MED ORDER — SODIUM CHLORIDE 0.9 % IV SOLN: 500.0000 mL | INTRAVENOUS | Status: DC

## 2015-08-19 NOTE — Op Note (Signed)
[facility] Patient Name: Tony [name] Procedure Date: 08/19/2015 9:00 AM MRN: [id number] Endoscopist: [name] , MD Age: 50 Referring MD:  Date of Birth: 01-05-1966 Gender: Male Procedure:                Upper GI endoscopy Indications:              Epigastric abdominal pain, Previously treated for                            Helicobacter pylori, Nausea, Personal history of                            peptic ulcer disease Medicines:                Monitored Anesthesia Care Procedure:                Pre-Anesthesia Assessment:                           - Prior to the procedure, a History and Physical                            was performed, and patient medications and                            allergies were reviewed. The patient's tolerance of                            previous anesthesia was also reviewed. The risks                            and benefits of the procedure and the sedation                            options and risks were discussed with the patient.                            All questions were answered, and informed consent                            was obtained. Prior Anticoagulants: The patient has                            taken no previous anticoagulant or antiplatelet                            agents. ASA Grade Assessment: II - A patient with                            mild systemic disease. After reviewing the risks                            and benefits, the patient was deemed in  satisfactory condition to undergo the procedure.                           After obtaining informed consent, the endoscope was                            passed under direct vision. Throughout the                            procedure, the patient's blood pressure, pulse, and                            oxygen saturations were monitored continuously. The                            Model GIF-HQ190 [id number]) scope was introduced                   through the mouth, and advanced to the second part                            of duodenum. The upper GI endoscopy was                            accomplished without difficulty. The patient                            tolerated the procedure well. Scope In: Scope Out: Findings:      The examined esophagus was normal.      Moderate inflammation characterized by erythema and granularity was       found at the incisura and in the gastric antrum. Multiple biopsies were       obtained in the gastric body, at the incisura and in the gastric antrum       with cold forceps for histology. No ulcers found.      Localized mild inflammation characterized by erythema was found in the       duodenal bulb. No duodenal ulcers seen.      The second portion of the duodenum was normal.      The cardia and gastric fundus were normal on retroflexion. Complications:            No immediate complications. Estimated Blood Loss:     Estimated blood loss was minimal. Impression:               - Normal esophagus.                           - Acute gastritis.                           - Duodenitis.                           - Normal second portion of the duodenum.                           - Multiple biopsies were obtained in the gastric  body, at the incisura and in the gastric antrum. Recommendation:           - Patient has a contact number available for                            emergencies. The signs and symptoms of potential                            delayed complications were discussed with the                            patient. Return to normal activities tomorrow.                            Written discharge instructions were provided to the                            patient.                           - Resume previous diet.                           - No aspirin, ibuprofen, naproxen, or other                            non-steroidal anti-inflammatory drugs.                            - Await pathology results. Retreat H. pylori if                            needed based on pathology results.                           - Continue pantoprazole 40 mg twice daily and                            Carafate before meals and at bedtime. Procedure Code(s):        --- Professional ---                           [phone], Esophagogastroduodenoscopy, flexible,                            transoral; with biopsy, single or multiple CPT copyright 2016 American Medical Association. All rights reserved. Lajuan Lines. Hilarie Fredrickson, MD [name], MD 08/19/2015 9:27:21 AM This report has been signed electronically. Number of Addenda: 0

## 2015-08-19 NOTE — Progress Notes (Signed)
Called to room to assist during endoscopic procedure.  Patient ID and intended procedure confirmed with present staff. Received instructions for my participation in the procedure from the performing physician.  

## 2015-08-19 NOTE — Progress Notes (Signed)
PATIENT STATING HE HAS NOT HAD A BOWEL MOVEMENT FOR TWO WEEKS. ON FURTHER QUESTIONING PATIENT DID HAVE A SMALL BOWEL MOVEMENT THIS AM. PATIENT USED X LAX FOR CONSTIPATION WITHOUT RESULTS. INFORMED DR. PYRTLE. DR. Hilarie Fredrickson  IN TO SEE PATIENT. PATIENT INFORMED TO TAKE MIRALX TWO TO THREE DOSES TODAY AND THEN TITRATE UNTIL NORMAL BOWEL MOVEMENTS. PATIENT AND WIFE VERBALIZED UNDERSTANDING AND AGREEMENT WITH PLAN.

## 2015-08-19 NOTE — Progress Notes (Signed)
A/ox3, pleased with MAC, report to RN 

## 2015-08-19 NOTE — Patient Instructions (Signed)
  AVOID NSAIDS (MOTRIN, ADVIL, ALEVE, NAPROSYN , ETC)  PROTONIX CONTINUES TWICE DAILY .  CARAFATE CONTINUES.  YOU HAD AN ENDOSCOPIC PROCEDURE TODAY AT [facility]:   Refer to the procedure report that was given to you for any specific questions about what was found during the examination.  If the procedure report does not answer your questions, please call your gastroenterologist to clarify.  If you requested that your care partner not be given the details of your procedure findings, then the procedure report has been included in a sealed envelope for you to review at your convenience later.  YOU SHOULD EXPECT: Some feelings of bloating in the abdomen. Passage of more gas than usual.  Walking can help get rid of the air that was put into your GI tract during the procedure and reduce the bloating. If you had a lower endoscopy (such as a colonoscopy or flexible sigmoidoscopy) you may notice spotting of blood in your stool or on the toilet paper. If you underwent a bowel prep for your procedure, you may not have a normal bowel movement for a few days.  Please Note:  You might notice some irritation and congestion in your nose or some drainage.  This is from the oxygen used during your procedure.  There is no need for concern and it should clear up in a day or so.  SYMPTOMS TO REPORT IMMEDIATELY:   Following upper endoscopy (EGD)  Vomiting of blood or coffee ground material  New chest pain or pain under the shoulder blades  Painful or persistently difficult swallowing  New shortness of breath  Fever of 100F or higher  Black, tarry-looking stools  For urgent or emergent issues, a gastroenterologist can be reached at any hour by calling [phone].   DIET: Your first meal following the procedure should be a small meal and then it is ok to progress to your normal diet. Heavy or fried foods are harder to digest and may make you feel nauseous or bloated.  Likewise, meals heavy  in dairy and vegetables can increase bloating.  Drink plenty of fluids but you should avoid alcoholic beverages for 24 hours.  ACTIVITY:  You should plan to take it easy for the rest of today and you should NOT DRIVE or use heavy machinery until tomorrow (because of the sedation medicines used during the test).    FOLLOW UP: Our staff will call the number listed on your records the next business day following your procedure to check on you and address any questions or concerns that you may have regarding the information given to you following your procedure. If we do not reach you, we will leave a message.  However, if you are feeling well and you are not experiencing any problems, there is no need to return our call.  We will assume that you have returned to your regular daily activities without incident.  If any biopsies were taken you will be contacted by phone or by letter within the next 1-3 weeks.  Please call us at [phone] if you have not heard about the biopsies in 3 weeks.    SIGNATURES/CONFIDENTIALITY: You and/or your care partner have signed paperwork which will be entered into your electronic medical record.  These signatures attest to the fact that that the information above on your After Visit Summary has been reviewed and is understood.  Full responsibility of the confidentiality of this discharge information lies with you and/or your care-partner.

## 2015-08-20 ENCOUNTER — Telehealth: Payer: Self-pay

## 2015-08-20 ENCOUNTER — Other Ambulatory Visit: Payer: BC Managed Care – PPO

## 2015-08-20 DIAGNOSIS — R945 Abnormal results of liver function studies: Principal | ICD-10-CM

## 2015-08-20 DIAGNOSIS — R7989 Other specified abnormal findings of blood chemistry: Secondary | ICD-10-CM

## 2015-08-20 NOTE — Telephone Encounter (Signed)
  Follow up Call-  Call back number 08/19/2015  Post procedure Call Back phone  # [phone]  Permission to leave phone message Yes     Patient questions:  Do you have a fever, pain , or abdominal swelling? No. Pain Score  0 *  Have you tolerated food without any problems? Yes.    Have you been able to return to your normal activities? Yes.    Do you have any questions about your discharge instructions: Diet   No. Medications  No. Follow up visit  No.  Do you have questions or concerns about your Care? No.  Actions: * If pain score is 4 or above: No action needed, pain <4.

## 2015-08-21 LAB — H. PYLORI ANTIGEN, STOOL: H pylori Ag, Stl: NEGATIVE

## 2015-08-21 LAB — HEPATITIS B SURFACE ANTIGEN: HEP B S AG: NEGATIVE

## 2015-08-21 LAB — HEPATITIS B SURFACE ANTIBODY,QUALITATIVE: HEP B S AB: POSITIVE — AB

## 2015-08-21 LAB — HEPATITIS C ANTIBODY: HCV AB: NEGATIVE

## 2015-08-25 ENCOUNTER — Encounter: Payer: Self-pay | Admitting: Internal Medicine

## 2015-09-17 ENCOUNTER — Other Ambulatory Visit: Payer: Self-pay | Admitting: Otolaryngology

## 2015-09-17 DIAGNOSIS — H93A2 Pulsatile tinnitus, left ear: Secondary | ICD-10-CM

## 2015-11-07 ENCOUNTER — Encounter (HOSPITAL_COMMUNITY): Payer: Self-pay

## 2015-11-07 ENCOUNTER — Emergency Department (HOSPITAL_COMMUNITY)
Admission: EM | Admit: 2015-11-07 | Discharge: 2015-11-07 | Disposition: A | Discharge status: Home / Self Care | Payer: BC Managed Care – PPO | Attending: Emergency Medicine | Admitting: Emergency Medicine

## 2015-11-07 DIAGNOSIS — Z8701 Personal history of pneumonia (recurrent): Secondary | ICD-10-CM | POA: Insufficient documentation

## 2015-11-07 DIAGNOSIS — L089 Local infection of the skin and subcutaneous tissue, unspecified: Secondary | ICD-10-CM

## 2015-11-07 DIAGNOSIS — F17210 Nicotine dependence, cigarettes, uncomplicated: Secondary | ICD-10-CM | POA: Insufficient documentation

## 2015-11-07 DIAGNOSIS — I10 Essential (primary) hypertension: Secondary | ICD-10-CM | POA: Diagnosis not present

## 2015-11-07 DIAGNOSIS — H9201 Otalgia, right ear: Secondary | ICD-10-CM | POA: Diagnosis present

## 2015-11-07 DIAGNOSIS — H6092 Unspecified otitis externa, left ear: Secondary | ICD-10-CM | POA: Insufficient documentation

## 2015-11-07 DIAGNOSIS — Z79899 Other long term (current) drug therapy: Secondary | ICD-10-CM | POA: Diagnosis not present

## 2015-11-07 DIAGNOSIS — K219 Gastro-esophageal reflux disease without esophagitis: Secondary | ICD-10-CM | POA: Insufficient documentation

## 2015-11-07 DIAGNOSIS — H6091 Unspecified otitis externa, right ear: Secondary | ICD-10-CM

## 2015-11-07 MED ORDER — HYDROCODONE-ACETAMINOPHEN 5-325 MG PO TABS: 1.0000 | ORAL_TABLET | Freq: Four times a day (QID) | ORAL | Status: DC | PRN

## 2015-11-07 MED ORDER — CEPHALEXIN 250 MG PO CAPS
500.0000 mg | ORAL_CAPSULE | Freq: Once | ORAL | Status: AC
  Administered 2015-11-07: 500 mg via ORAL
  Filled 2015-11-07: qty 2

## 2015-11-07 MED ORDER — HYDROCODONE-ACETAMINOPHEN 5-325 MG PO TABS
2.0000 | ORAL_TABLET | Freq: Once | ORAL | Status: AC
  Administered 2015-11-07: 2 via ORAL
  Filled 2015-11-07: qty 2

## 2015-11-07 MED ORDER — CEPHALEXIN 500 MG PO CAPS: 500.0000 mg | ORAL_CAPSULE | Freq: Four times a day (QID) | ORAL | Status: DC

## 2015-11-07 MED ORDER — CIPROFLOXACIN-DEXAMETHASONE 0.3-0.1 % OT SUSP
4.0000 [drp] | Freq: Once | OTIC | Status: AC
  Administered 2015-11-07: 4 [drp] via OTIC
  Filled 2015-11-07: qty 7.5

## 2015-11-07 NOTE — ED Notes (Signed)
Onset 1 month bump on inside of right ear canal, onset 2 days bump has been painful, draining pus.  Has muffled hearing.  No fever.

## 2015-11-07 NOTE — ED Notes (Signed)
See EDP assessment 

## 2015-11-07 NOTE — ED Provider Notes (Signed)
CSN: [id number]     Arrival date & time 11/07/15  1911 History  By signing my name below, I, Meriel Flavors, attest that this documentation has been prepared under the direction and in the presence of [name] PA-C  Electronically Signed: Meriel Flavors, ED Scribe. 11/07/2015. 4:01 PM.   Chief Complaint  Patient presents with  . Ear Drainage   HPI HPI Comments: Tony [name] is a 50 y.o. male with a PMHx of HTN, who presents to the Emergency Department complaining of constant, gradually worsening, right ear pain that radiates down his neck that began two days ago. Pt also reports muffled hearing. Pt states he first noticed a painful bump with drainage on the inside of his right ear canal before the pain became worse. Pt denies fever, recently swimming, and any allergies to antibiotics.  Past Medical History  Diagnosis Date  . Hypertension   . GERD (gastroesophageal reflux disease)   . Gastric ulcer due to Helicobacter pylori 3419  . Pneumonia 2011  . Mesenteric adenitis 2011    presumed/H&P  . IBS (irritable bowel syndrome)   . Ulcer    Past Surgical History  Procedure Laterality Date  . Knee arthroscopy      left  . Esophagogastroduodenoscopy  08/22/2011    Procedure: ESOPHAGOGASTRODUODENOSCOPY (EGD);  Surgeon: [name], MD;  Location: [facility];  Service: Gastroenterology;  Laterality: N/A;   Family History  Problem Relation Age of Onset  . Hypertension Mother   . Hypertension Father   . Heart disease Father     CAD  . Diabetes Neg Hx   . Stroke Neg Hx    Social History  Substance Use Topics  . Smoking status: Current Every Day Smoker -- 1.00 packs/day for 10 years    Types: Cigarettes  . Smokeless tobacco: Never Used  . Alcohol Use: 4.2 oz/week    7 Cans of beer per week     Comment: beer    Review of Systems  Constitutional: Negative for fever and chills.  HENT: Positive for ear discharge (from abscess), ear pain and hearing loss (muffled ).        Allergies  Morphine and related  Home Medications   Prior to Admission medications   Medication Sig Start Date End Date Taking? Authorizing Provider  atorvastatin (LIPITOR) 10 MG tablet Take 10 mg by mouth daily.    Historical Provider, MD  hydrochlorothiazide (HYDRODIURIL) 25 MG tablet TK 1 T PO D 07/28/15   Historical Provider, MD  metoprolol succinate (TOPROL-XL) 100 MG 24 hr tablet Take 100 mg by mouth at bedtime. Take with or immediately following a meal.    Historical Provider, MD  ondansetron (ZOFRAN) 4 MG tablet Take 1 tablet every 6 hours as needed for nausea. 08/16/15   [name], PA-C  pantoprazole (PROTONIX) 40 MG tablet Take 1 tablet (40 mg total) by mouth 2 (two) times daily. 08/16/15   [name], PA-C  sucralfate (CARAFATE) 1 GM/10ML suspension Take 10 mLs (1 g total) by mouth 4 (four) times daily -  with meals and at bedtime. 08/16/15   [name], PA-C   Triage Vitals: BP 155/105 mmHg  Pulse 83  Temp(Src) 98.1 F (36.7 C) (Oral)  Resp 18  Ht 6' (1.829 m)  Wt 183 lb (83.008 kg)  BMI 24.81 kg/m2  SpO2 97% Physical Exam  Constitutional: He is oriented to person, place, and time. He appears well-developed and well-nourished. No distress.  HENT:  Head: Normocephalic and atraumatic.  Right ear canal consistent with otitis externa with moderate edema, erythema, and debris, and there is also a small pustule near the opening of the ear canal, which appears to have been recently ruptured, there is nothing requiring drainage at this time, there is no obvious signs of surrounding external cellulitis or infection, no mastoid tenderness  Eyes: Conjunctivae and EOM are normal.  Neck: Normal range of motion.  Cardiovascular: Normal rate.   Pulmonary/Chest: Effort normal.  Abdominal: He exhibits no distension.  Musculoskeletal: Normal range of motion.  Neurological: He is alert and oriented to person, place, and time.  Skin: Skin is warm and dry. He is not  diaphoretic.  Psychiatric: He has a normal mood and affect. His behavior is normal. Judgment and thought content normal.  Nursing note and vitals reviewed.   ED Course  Procedures  DIAGNOSTIC STUDIES: Oxygen Saturation is 97% on RA, normal by my interpretation.  COORDINATION OF CARE: 8:10 PM Discussed treatment plan which includes topical with oral Abx and pain medication with pt at bedside and pt agreed to plan.    MDM   Final diagnoses:  Otitis externa, right  Pustule    Patient with a small recently ruptured pustule to right ear canal, with some debris and edema of the ear canal itself. Will treat with Ciprodex. There is no congestion seen behind the tympanic membrane. No evidence of cellulitis, but given location, will treat with Keflex. Recommend close follow-up. Return for worsening symptoms. Patient understands and agrees the plan. He is stable and ready for discharge.  I personally performed the services described in this documentation, which was scribed in my presence. The recorded information has been reviewed and is accurate.     [name], PA-C 11/07/15 2020  Gareth Morgan, MD 11/10/15 1314

## 2015-11-07 NOTE — Discharge Instructions (Signed)
Place 4 drops in right ear two times a day.  You have a sore in your ear that could be infected.  At this time there is nothing that would require draining in the ER.  Please monitor your symptoms carefully and return for any new or worsening symptoms.  Please follow-up with your doctor in 3 days.  See the specialist if no improvement in 1 week.  Return for worsening symptoms.  Otitis Externa Otitis externa is a bacterial or fungal infection of the outer ear canal. This is the area from the eardrum to the outside of the ear. Otitis externa is sometimes called "swimmer's ear." CAUSES  Possible causes of infection include:  Swimming in dirty water.  Moisture remaining in the ear after swimming or bathing.  Mild injury (trauma) to the ear.  Objects stuck in the ear (foreign body).  Cuts or scrapes (abrasions) on the outside of the ear. SIGNS AND SYMPTOMS  The first symptom of infection is often itching in the ear canal. Later signs and symptoms may include swelling and redness of the ear canal, ear pain, and yellowish-white fluid (pus) coming from the ear. The ear pain may be worse when pulling on the earlobe. DIAGNOSIS  Your health care provider will perform a physical exam. A sample of fluid may be taken from the ear and examined for bacteria or fungi. TREATMENT  Antibiotic ear drops are often given for 10 to 14 days. Treatment may also include pain medicine or corticosteroids to reduce itching and swelling. HOME CARE INSTRUCTIONS   Apply antibiotic ear drops to the ear canal as prescribed by your health care provider.  Take medicines only as directed by your health care provider.  If you have diabetes, follow any additional treatment instructions from your health care provider.  Keep all follow-up visits as directed by your health care provider. PREVENTION   Keep your ear dry. Use the corner of a towel to absorb water out of the ear canal after swimming or bathing.  Avoid  scratching or putting objects inside your ear. This can damage the ear canal or remove the protective wax that lines the canal. This makes it easier for bacteria and fungi to grow.  Avoid swimming in lakes, polluted water, or poorly chlorinated pools.  You may use ear drops made of rubbing alcohol and vinegar after swimming. Combine equal parts of white vinegar and alcohol in a bottle. Put 3 or 4 drops into each ear after swimming. SEEK MEDICAL CARE IF:   You have a fever.  Your ear is still red, swollen, painful, or draining pus after 3 days.  Your redness, swelling, or pain gets worse.  You have a severe headache.  You have redness, swelling, pain, or tenderness in the area behind your ear. MAKE SURE YOU:   Understand these instructions.  Will watch your condition.  Will get help right away if you are not doing well or get worse.   This information is not intended to replace advice given to you by your health care provider. Make sure you discuss any questions you have with your health care provider.   Document Released: 05/29/2005 Document Revised: 06/19/2014 Document Reviewed: 06/15/2011 Elsevier Interactive Patient Education [employer].

## 2016-01-19 ENCOUNTER — Encounter (HOSPITAL_COMMUNITY): Payer: Self-pay | Admitting: *Deleted

## 2016-01-19 ENCOUNTER — Emergency Department (HOSPITAL_COMMUNITY): Payer: Worker's Compensation

## 2016-01-19 ENCOUNTER — Emergency Department (HOSPITAL_COMMUNITY)
Admission: EM | Admit: 2016-01-19 | Discharge: 2016-01-19 | Disposition: A | Discharge status: Home / Self Care | Payer: Worker's Compensation | Attending: Emergency Medicine | Admitting: Emergency Medicine

## 2016-01-19 DIAGNOSIS — F17210 Nicotine dependence, cigarettes, uncomplicated: Secondary | ICD-10-CM | POA: Diagnosis not present

## 2016-01-19 DIAGNOSIS — M545 Low back pain, unspecified: Secondary | ICD-10-CM

## 2016-01-19 DIAGNOSIS — I10 Essential (primary) hypertension: Secondary | ICD-10-CM | POA: Insufficient documentation

## 2016-01-19 DIAGNOSIS — Z79899 Other long term (current) drug therapy: Secondary | ICD-10-CM | POA: Diagnosis not present

## 2016-01-19 MED ORDER — DIAZEPAM 5 MG PO TABS
5.0000 mg | ORAL_TABLET | Freq: Once | ORAL | Status: AC
  Administered 2016-01-19: 5 mg via ORAL
  Filled 2016-01-19: qty 1

## 2016-01-19 MED ORDER — OXYCODONE-ACETAMINOPHEN 5-325 MG PO TABS: 1.0000 | ORAL_TABLET | ORAL | 0 refills | Status: DC | PRN

## 2016-01-19 MED ORDER — OXYCODONE-ACETAMINOPHEN 5-325 MG PO TABS
1.0000 | ORAL_TABLET | Freq: Once | ORAL | Status: AC
  Administered 2016-01-19: 1 via ORAL
  Filled 2016-01-19: qty 1

## 2016-01-19 MED ORDER — DIAZEPAM 5 MG PO TABS: 5.0000 mg | ORAL_TABLET | Freq: Two times a day (BID) | ORAL | 0 refills | Status: DC

## 2016-01-19 NOTE — ED Provider Notes (Signed)
[facility] DE[facility] Provider Note   CSN: [id number] Arrival date & time: 01/19/16  1313  First Provider Contact:  None    By signing my name below, I, Higinio Plan, attest that this documentation has been prepared under the direction and in the presence of non-physician practitioner, Harlene Ramus, PA-C. Electronically Signed: Higinio Plan, Scribe. 01/19/2016. 2:14 PM.  History   Chief Complaint Chief Complaint  Patient presents with  . Back Pain   The history is provided by the patient. No language interpreter was used.   HPI Comments: Tony [name] is a 50 y.o. male with PMHx of HTN, who presents to the Emergency Department complaining of gradually worsening, lower back pain s/p an injury at work this afternoon. Pt states his pain radiates from his lower back on both sides into both buttocks. He also notes a "burning" sensation in his left leg and numbness in his left foot; though, he states these are chronic symptoms began before the onset of his back pain related to his sciatica. Pt reports he was pulling a pallet at work when he experienced dull lower back pain. He states he continued to jack and pull the pallet until he experienced severe pain in his lower back in which he became "stuck" in a bending position for ~20 minutes. He denies falling during this incident. He notes his pain is exacerbated with movement; he states he has taken tylenol with no relief. Pt states he went to visit the doctor at his work and was advised to come to the ED due to pain. He notes hx of back pain in 2009 that was treated with medication but denies any surgery to his back. Pt denies fever, numbness, tingling, saddle anesthesia, loss of bowel or bladder, weakness, abdominal pain, chest pain, SOB, urinary sxs, IVDU, cancer or recent spinal manipulation.   Past Medical History:  Diagnosis Date  . Gastric ulcer due to Helicobacter pylori 2376  . GERD (gastroesophageal reflux disease)   . Hypertension   . IBS  (irritable bowel syndrome)   . Mesenteric adenitis 2011   presumed/H&P  . Pneumonia 2011  . Ulcer    Patient Active Problem List   Diagnosis Date Noted  . H. pylori infection 10/11/2011  . Duodenal ulcer 08/23/2011  . IBS (irritable bowel syndrome) 08/18/2011  . DEPRESSION 05/11/2009  . EPIGASTRIC PAIN, CHRONIC 05/11/2009  . SMOKER 09/01/2008  . INSOMNIA, CHRONIC 09/01/2008  . ESSENTIAL HYPERTENSION 09/01/2008  . PALPITATIONS, CHRONIC 09/01/2008  . GASTRITIS 10/24/2007   Past Surgical History:  Procedure Laterality Date  . ESOPHAGOGASTRODUODENOSCOPY  08/22/2011   Procedure: ESOPHAGOGASTRODUODENOSCOPY (EGD);  Surgeon: [name], MD;  Location: [facility];  Service: Gastroenterology;  Laterality: N/A;  . KNEE ARTHROSCOPY     left    Home Medications    Prior to Admission medications   Medication Sig Start Date End Date Taking? Authorizing Provider  atorvastatin (LIPITOR) 10 MG tablet Take 10 mg by mouth daily.    Historical Provider, MD  cephALEXin (KEFLEX) 500 MG capsule Take 1 capsule (500 mg total) by mouth 4 (four) times daily. 11/07/15   [name], PA-C  diazepam (VALIUM) 5 MG tablet Take 1 tablet (5 mg total) by mouth 2 (two) times daily. 01/19/16   [name], PA-C  hydrochlorothiazide (HYDRODIURIL) 25 MG tablet TK 1 T PO D 07/28/15   Historical Provider, MD  HYDROcodone-acetaminophen (NORCO/VICODIN) 5-325 MG tablet Take 1-2 tablets by mouth every 6 (six) hours as needed. 11/07/15   Herbie Baltimore  Browning, PA-C  metoprolol succinate (TOPROL-XL) 100 MG 24 hr tablet Take 100 mg by mouth at bedtime. Take with or immediately following a meal.    Historical Provider, MD  ondansetron (ZOFRAN) 4 MG tablet Take 1 tablet every 6 hours as needed for nausea. 08/16/15   [name], PA-C  oxyCODONE-acetaminophen (PERCOCET/ROXICET) 5-325 MG tablet Take 1 tablet by mouth every 4 (four) hours as needed. 01/19/16   [name], PA-C  pantoprazole (PROTONIX) 40 MG  tablet Take 1 tablet (40 mg total) by mouth 2 (two) times daily. 08/16/15   [name], PA-C  sucralfate (CARAFATE) 1 GM/10ML suspension Take 10 mLs (1 g total) by mouth 4 (four) times daily -  with meals and at bedtime. 08/16/15   Amy Genia Harold, PA-C   Family History Family History  Problem Relation Age of Onset  . Hypertension Mother   . Hypertension Father   . Heart disease Father     CAD  . Diabetes Neg Hx   . Stroke Neg Hx    Social History Social History  Substance Use Topics  . Smoking status: Current Every Day Smoker    Packs/day: 1.00    Years: 10.00    Types: Cigarettes  . Smokeless tobacco: Never Used  . Alcohol use 4.2 oz/week    7 Cans of beer per week     Comment: beer   Allergies   Morphine and related  Review of Systems Review of Systems  Constitutional: Negative for fever.  Respiratory: Negative for shortness of breath.   Cardiovascular: Negative for chest pain.  Gastrointestinal: Negative for abdominal pain, nausea and vomiting.  Musculoskeletal: Positive for back pain and myalgias.  Neurological: Positive for numbness.   Physical Exam Updated Vital Signs BP 166/99 (BP Location: Right Arm)   Pulse 78   Temp 97.8 F (36.6 C) (Oral)   Resp 18   SpO2 100%   Physical Exam  Constitutional: He is oriented to person, place, and time. He appears well-developed and well-nourished.  HENT:  Head: Normocephalic and atraumatic.  Eyes: Conjunctivae and EOM are normal. Right eye exhibits no discharge. Left eye exhibits no discharge. No scleral icterus.  Neck: Normal range of motion. Neck supple.  Cardiovascular: Normal rate, regular rhythm, normal heart sounds and intact distal pulses.   Pulmonary/Chest: Effort normal and breath sounds normal.  Abdominal: Soft. Bowel sounds are normal. There is no tenderness.  Musculoskeletal: He exhibits no edema.  No midline C, or T tenderness; midline lumbar tenderness. TTP over bilateral lumbar paraspinal muscles with  palpable muscle spasm noted. Full range of motion of neck and decreased ROM of back due to pain. Full range of motion of bilateral upper and lower extremities, with 5/5 strength. Sensation intact. 2+ radial and PT pulses. Cap refill <2 seconds. Patient able to stand and ambulate without assistance but endorses pain in his lower back.   Neurological: He is alert and oriented to person, place, and time.  Skin: Skin is warm and dry.  Nursing note and vitals reviewed.  ED Treatments / Results  Labs (all labs ordered are listed, but only abnormal results are displayed) Labs Reviewed  URINE RAPID DRUG SCREEN, HOSP PERFORMED    EKG  EKG Interpretation None      Radiology Dg Lumbar Spine Complete  Result Date: 01/19/2016 CLINICAL DATA:  Left lower back pain radiating into left lower extremity after lifting injury today. Initial encounter. EXAM: LUMBAR SPINE - COMPLETE 4+ VIEW COMPARISON:  Prior  CT of the abdomen and pelvis on 08/14/2011 FINDINGS: There is no evidence of lumbar spine fracture or subluxation. No evidence of disc space narrowing. Mild facet hypertrophy identified at L5-S1. No bony lesions. Calcified plaque is noted in both common iliac arteries. IMPRESSION: 1. No evidence of acute lumbar injury. 2. Mild facet hypertrophy at L5-S1. 3. Atherosclerosis with calcified plaque in the common iliac arteries bilaterally. Electronically Signed   By: Aletta Edouard M.D.   On: 01/19/2016 14:39    Procedures Procedures  DIAGNOSTIC STUDIES:  Oxygen Saturation is 100% on RA, normal by my interpretation.    COORDINATION OF CARE:  2:07 PM Discussed treatment plan with pt at bedside and pt agreed to plan.  Medications Ordered in ED Medications  oxyCODONE-acetaminophen (PERCOCET/ROXICET) 5-325 MG per tablet 1 tablet (1 tablet Oral Given 01/19/16 1415)  diazepam (VALIUM) tablet 5 mg (5 mg Oral Given 01/19/16 1415)    Initial Impression / Assessment and Plan / ED Course  I have reviewed the  triage vital signs and the nursing notes.  Pertinent labs & imaging results that were available during my care of the patient were reviewed by me and considered in my medical decision making (see chart for details).  Clinical Course    I personally performed the services described in this documentation, which was scribed in my presence. The recorded information has been reviewed and is accurate.   Final Clinical Impressions(s) / ED Diagnoses   Final diagnoses:  Bilateral low back pain without sciatica   Patient with back pain are using and lifting heavy objects at work. Denies any fall.  No neurological deficits and normal neuro exam.  Patient is ambulatory.  No loss of bowel or bladder control.  No concern for cauda equina.  No fever, night sweats, weight loss, h/o cancer, IVDA, no recent procedure to back. No urinary symptoms suggestive of UTI. Patient given pain meds and muscle relaxant in the ED. Lumbar spine x-ray with no acute injury. On reevaluation patient reports his pain has significantly improved. Plan to discharge patient home with pain meds, muscle relaxant and supportive care. Return precautions discussed. Appears safe for discharge at this time. Advised patient to follow up with his PCP in one week as needed.  New Prescriptions New Prescriptions   DIAZEPAM (VALIUM) 5 MG TABLET    Take 1 tablet (5 mg total) by mouth 2 (two) times daily.   OXYCODONE-ACETAMINOPHEN (PERCOCET/ROXICET) 5-325 MG TABLET    Take 1 tablet by mouth every 4 (four) hours as needed.     Chesley Noon [place], [place] 01/19/16 [place], [place] 01/19/16 [phone]

## 2016-01-19 NOTE — ED Triage Notes (Signed)
Pt reports he was at work, pulled a pallet causing severe pain in his lower back.  Pt reports hx of low back pain from 2009 with tingling and numbness in his LLE.  Pt states he's always had the tingling and numbness in his LE.

## 2016-01-19 NOTE — Discharge Instructions (Signed)
Take your medications as prescribed. I also recommend resting and applying heat to affected area for 15-20 minutes 3-4 times daily for your muscle spasms. Refrain from doing any bending or heavy lifting for the next few days until her symptoms have improved. Follow-up with your primary care provider in the next week if your symptoms have not improved. Please return to the Emergency Department if symptoms worsen or new onset of fever, numbness, tingling, saddle anesthesia, loss of bowel or bladder, weakness.

## 2016-05-14 ENCOUNTER — Encounter (HOSPITAL_COMMUNITY): Payer: Self-pay | Admitting: Emergency Medicine

## 2016-05-14 ENCOUNTER — Emergency Department (HOSPITAL_COMMUNITY)
Admission: EM | Admit: 2016-05-14 | Discharge: 2016-05-14 | Disposition: A | Discharge status: Home / Self Care | Payer: BC Managed Care – PPO | Attending: Emergency Medicine | Admitting: Emergency Medicine

## 2016-05-14 ENCOUNTER — Emergency Department (HOSPITAL_COMMUNITY): Payer: BC Managed Care – PPO

## 2016-05-14 DIAGNOSIS — I10 Essential (primary) hypertension: Secondary | ICD-10-CM | POA: Diagnosis not present

## 2016-05-14 DIAGNOSIS — R079 Chest pain, unspecified: Secondary | ICD-10-CM | POA: Insufficient documentation

## 2016-05-14 DIAGNOSIS — F17210 Nicotine dependence, cigarettes, uncomplicated: Secondary | ICD-10-CM | POA: Diagnosis not present

## 2016-05-14 DIAGNOSIS — R42 Dizziness and giddiness: Secondary | ICD-10-CM | POA: Diagnosis not present

## 2016-05-14 LAB — CBC
HCT: 38.3 % — ABNORMAL LOW (ref 39.0–52.0)
Hemoglobin: 13.1 g/dL (ref 13.0–17.0)
MCH: 30.8 pg (ref 26.0–34.0)
MCHC: 34.2 g/dL (ref 30.0–36.0)
MCV: 90.1 fL (ref 78.0–100.0)
PLATELETS: 261 10*3/uL (ref 150–400)
RBC: 4.25 MIL/uL (ref 4.22–5.81)
RDW: 12.6 % (ref 11.5–15.5)
WBC: 6.8 10*3/uL (ref 4.0–10.5)

## 2016-05-14 LAB — BASIC METABOLIC PANEL
Anion gap: 11 (ref 5–15)
BUN: 11 mg/dL (ref 6–20)
CALCIUM: 9.2 mg/dL (ref 8.9–10.3)
CHLORIDE: 105 mmol/L (ref 101–111)
CO2: 23 mmol/L (ref 22–32)
CREATININE: 1.03 mg/dL (ref 0.61–1.24)
GFR calc Af Amer: >60 mL/min (ref >60)
GFR calc non Af Amer: >60 mL/min (ref >60)
Glucose, Bld: 94 mg/dL (ref 65–99)
Potassium: 3.1 mmol/L — ABNORMAL LOW (ref 3.5–5.1)
SODIUM: 139 mmol/L (ref 135–145)

## 2016-05-14 LAB — I-STAT TROPONIN, ED
TROPONIN I, POC: 0 ng/mL (ref 0.00–0.08)
Troponin i, poc: 0 ng/mL (ref 0.00–0.08)

## 2016-05-14 MED ORDER — POTASSIUM CHLORIDE CRYS ER 20 MEQ PO TBCR
40.0000 meq | EXTENDED_RELEASE_TABLET | Freq: Once | ORAL | Status: AC
  Administered 2016-05-14: 40 meq via ORAL
  Filled 2016-05-14: qty 2

## 2016-05-14 NOTE — ED Notes (Signed)
Patient transported to X-ray 

## 2016-05-14 NOTE — ED Notes (Signed)
Pt. Coming from home via [facility] for chest pain starting at 8pm. Pt. Describes pain as sharp pain to left chest pain with radiation into back, neck, and left arm. EMS reports 12 lead as NSR. Pt. Called EMS due to being lightheaded. Pt. Able to ambulate on the scene. Pt. Given 324 ASA and 1 nitro with no relief.

## 2016-05-14 NOTE — ED Notes (Signed)
EDP at bedside  

## 2016-05-14 NOTE — ED Provider Notes (Signed)
[place] DEPT Provider Note   CSN: [id number] Arrival date & time: 05/14/16  0135  By signing my name below, I, Tony [name], attest that this documentation has been prepared under the direction and in the presence of Tony Morgan, MD. Electronically Signed: Reola [name], ED Scribe. 05/14/16. 2:16 AM.  History   Chief Complaint Chief Complaint  Patient presents with  . Chest Pain   The history is provided by the patient. No language interpreter was used.     HPI Comments: Tony [name] is a 50 y.o. male BIB EMS, with a PMHx including GERD, IBS, and HTN, who presents to the Emergency Department complaining of intermittent episodes of sharp chest pain onset approximately two hours ago. Pt rates his current episodes of pain as 8/10. He reports associated light-headedness and diaphoresis secondary to his episodes of chest pain. Pt states that initially his pain began in his left arm approximately 6 hours ago which has since resolved, with his chest pain beginning ~4 hours afterwards. He notes that there is no pattern to his pain, and there are no modifying or alleviating factors. EMS administered '324mg'$  ASA and 1 dose of NTG prior to their arrival with minimal relief. No Hx of PE/DVT, recent long travel, surgery, fracture, prolonged immobilization, or hormone use. Pt is a current, everyday smoker. Pt notes that he has an immediate family h/o cardiac related death through his brother and father. He believes his father's first heart disease was diagnosed before age 84 and that his brother's death was also before 56.Marland Kitchen No illicit drug usage, or medication changes. His pain is otherwise non-exertional.  Reports he was lifting his heavy dog up the stairs yesterday, and lifting books 2-3 days ago, and reports pain feels like pain he has had with prior muscle strains.  It seems to be brought on by certain movements, like turning head to right.  Pt is not currently followed by a  Cardiologist. He denies nausea, shortness of breath, cough, fever, unilateral numbness/weakness, headache, abdominal pain, urgency, frequency, hematuria, dysuria, difficulty urinating, leg swelling, or any other associated symptoms.   PCP: Tony Amel, MD  Past Medical History:  Diagnosis Date  . Gastric ulcer due to Helicobacter pylori 0865  . GERD (gastroesophageal reflux disease)   . Hypertension   . IBS (irritable bowel syndrome)   . Mesenteric adenitis 2011   presumed/H&P  . Pneumonia 2011  . Ulcer [facility])    Patient Active Problem List   Diagnosis Date Noted  . H. pylori infection 10/11/2011  . Duodenal ulcer 08/23/2011  . IBS (irritable bowel syndrome) 08/18/2011  . DEPRESSION 05/11/2009  . EPIGASTRIC PAIN, CHRONIC 05/11/2009  . SMOKER 09/01/2008  . INSOMNIA, CHRONIC 09/01/2008  . ESSENTIAL HYPERTENSION 09/01/2008  . PALPITATIONS, CHRONIC 09/01/2008  . GASTRITIS 10/24/2007   Past Surgical History:  Procedure Laterality Date  . ESOPHAGOGASTRODUODENOSCOPY  08/22/2011   Procedure: ESOPHAGOGASTRODUODENOSCOPY (EGD);  Surgeon: [name], MD;  Location: [place];  Service: Gastroenterology;  Laterality: N/A;  . KNEE ARTHROSCOPY     left    Home Medications    Prior to Admission medications   Medication Sig Start Date End Date Taking? Authorizing Provider  atorvastatin (LIPITOR) 10 MG tablet Take 10 mg by mouth daily.   Yes Historical Provider, MD  hydrochlorothiazide (HYDRODIURIL) 25 MG tablet Take 25 mg by mouth daily.   Yes Historical Provider, MD  methocarbamol (ROBAXIN) 500 MG tablet Take 500 mg by mouth every 6 (six) hours as needed for  muscle spasms.   Yes Historical Provider, MD  metoprolol succinate (TOPROL-XL) 100 MG 24 hr tablet Take 100 mg by mouth at bedtime. Take with or immediately following a meal.   Yes Historical Provider, MD  pantoprazole (PROTONIX) 40 MG tablet Take 1 tablet (40 mg total) by mouth 2 (two) times daily. 08/16/15  Yes [name],  PA-C  cephALEXin (KEFLEX) 500 MG capsule Take 1 capsule (500 mg total) by mouth 4 (four) times daily. Patient not taking: Reported on 05/14/2016 11/07/15   [name], PA-C  diazepam (VALIUM) 5 MG tablet Take 1 tablet (5 mg total) by mouth 2 (two) times daily. Patient not taking: Reported on 05/14/2016 01/19/16   [name], PA-C  HYDROcodone-acetaminophen (NORCO/VICODIN) 5-325 MG tablet Take 1-2 tablets by mouth every 6 (six) hours as needed. Patient not taking: Reported on 05/14/2016 11/07/15   [name], PA-C  ondansetron (ZOFRAN) 4 MG tablet Take 1 tablet every 6 hours as needed for nausea. Patient not taking: Reported on 05/14/2016 08/16/15   [name], PA-C  oxyCODONE-acetaminophen (PERCOCET/ROXICET) 5-325 MG tablet Take 1 tablet by mouth every 4 (four) hours as needed. Patient not taking: Reported on 05/14/2016 01/19/16   [name], PA-C  sucralfate (CARAFATE) 1 GM/10ML suspension Take 10 mLs (1 g total) by mouth 4 (four) times daily -  with meals and at bedtime. Patient not taking: Reported on 05/14/2016 08/16/15   [name], PA-C   Family History Family History  Problem Relation Age of Onset  . Hypertension Mother   . Hypertension Father   . Heart disease Father     CAD  . Diabetes Neg Hx   . Stroke Neg Hx    Social History Social History  Substance Use Topics  . Smoking status: Current Every Day Smoker    Packs/day: 1.00    Years: 10.00    Types: Cigarettes  . Smokeless tobacco: Never Used  . Alcohol use 4.2 oz/week    7 Cans of beer per week     Comment: beer   Allergies   Morphine and related  Review of Systems Review of Systems  Constitutional: Positive for diaphoresis. Negative for fever.  HENT: Negative for sore throat.   Eyes: Negative for visual disturbance.  Respiratory: Negative for shortness of breath.   Cardiovascular: Positive for chest pain. Negative for leg swelling.  Gastrointestinal: Negative for abdominal pain,  nausea and vomiting.  Genitourinary: Negative for difficulty urinating, dysuria, frequency, hematuria and urgency.  Musculoskeletal: Positive for myalgias. Negative for back pain and neck stiffness.  Skin: Negative for rash.  Neurological: Positive for light-headedness. Negative for syncope, weakness, numbness and headaches.   Physical Exam Updated Vital Signs BP 131/84   Pulse 61   Temp 98 F (36.7 C)   Resp 13   Ht 6' (1.829 m)   Wt 185 lb (83.9 kg)   SpO2 100%   BMI 25.09 kg/m   Physical Exam  Constitutional: He appears well-developed and well-nourished. No distress.  HENT:  Head: Normocephalic and atraumatic.  Right Ear: External ear normal.  Left Ear: External ear normal.  Nose: Nose normal.  Eyes: Conjunctivae are normal.  Neck: Normal range of motion.  Cardiovascular: Normal rate, regular rhythm, normal heart sounds and intact distal pulses.   No murmur heard. Pulses:      Radial pulses are 2+ on the right side, and 2+ on the left side.       Dorsalis pedis pulses are 2+  on the right side, and 2+ on the left side.  Pulmonary/Chest: Effort normal and breath sounds normal. No respiratory distress. He has no wheezes. He has no rales. He exhibits tenderness (initially reported no but on reeval says there is an area).  Abdominal: Soft. He exhibits no distension. There is no tenderness. There is no rebound and no guarding.  Musculoskeletal: Normal range of motion.  Neurological: He is alert.  Skin: He is not diaphoretic. No pallor.  Psychiatric: He has a normal mood and affect. His behavior is normal.  Nursing note and vitals reviewed.  ED Treatments / Results  DIAGNOSTIC STUDIES: Oxygen Saturation is 100% on RA, normal by my interpretation.   COORDINATION OF CARE: 2:16 AM-Discussed next steps with pt. Pt verbalized understanding and is agreeable with the plan.   Labs (all labs ordered are listed, but only abnormal results are displayed) Labs Reviewed  BASIC  METABOLIC PANEL - Abnormal; Notable for the following:       Result Value   Potassium 3.1 (*)    All other components within normal limits  CBC - Abnormal; Notable for the following:    HCT 38.3 (*)    All other components within normal limits  I-STAT TROPOININ, ED  I-STAT TROPOININ, ED   EKG  EKG Interpretation  Date/Time:  Sunday May 14 2016 01:39:31 EST Ventricular Rate:  75 PR Interval:    QRS Duration: 85 QT Interval:  372 QTC Calculation: 416 R Axis:   76 Text Interpretation:  Sinus rhythm ST elev, probable normal early repol pattern No significant change since last tracing Confirmed by [facility] MD, Junie Panning ([id number]) on 05/14/2016 1:45:31 AM Also confirmed by [facility] MD, Alazay Leicht ([id number]), editor [place], Leda Gauze [phone])  on 05/14/2016 7:16:13 AM      Radiology Dg Chest 2 View  Result Date: 05/14/2016 CLINICAL DATA:  Initial evaluation for acute left-sided chest pain, cough. EXAM: CHEST  2 VIEW COMPARISON:  Prior radiograph from 07/18/2009. FINDINGS: The cardiac and mediastinal silhouettes are stable in size and contour, and remain within normal limits. The lungs are normally inflated. No airspace consolidation, pleural effusion, or pulmonary edema is identified. There is no pneumothorax. No acute osseous abnormality identified. IMPRESSION: No active cardiopulmonary disease. Electronically Signed   By: Jeannine Boga M.D.   On: 05/14/2016 03:23    Procedures Procedures   Medications Ordered in ED Medications  potassium chloride SA (K-DUR,KLOR-CON) CR tablet 40 mEq (40 mEq Oral Given 05/14/16 0452)    Initial Impression / Assessment and Plan / ED Course  I have reviewed the triage vital signs and the nursing notes.  Pertinent labs & imaging results that were available during my care of the patient were reviewed by me and considered in my medical decision making (see chart for details).  Clinical Course    50 year-old male with history of presents with concern of  chest pain. Differential diagnosis for chest pain includes pulmonary embolus, dissection, pneumothorax, pneumonia, ACS, myocarditis, pericarditis.  EKG was done and evaluate by me and showed no acute ST changes and no signs of pericarditis. Chest x-ray was done and evaluated by me and radiology and showed no sign of pneumonia, no widened mediastinum, no pneumothorax. Patient is PERC negative and low risk Wells and have low suspicion for PE. Equal pulses bilaterally, normal XR, doubt dissection.  Delta troponins were both negative. Given this evaluation, history and physical have low suspicion for pulmonary embolus, pneumonia, ACS, myocarditis, pericarditis, dissection.  Patient high risk HEART  score given family hx, smoking hx, htn and discussed possibility of admission for further evaluation. Discussed that while troponins are negative and we have ruled out acute MI, we cannot rule out heart disease for which he is at higher risk. Discussed that he is at higher risk for major cardiac event by risk factors, however given patient without exertional pain, with history of lifting recently, pain with movements, feel that muscular pain is also likely.  Discussed options of obs vs cardiology follow up and patient would like outpatient Cardiology follow up. Patient discharged in stable condition with understanding of reasons to return.     Final Clinical Impressions(s) / ED Diagnoses   Final diagnoses:  Chest pain, unspecified type   New Prescriptions Discharge Medication List as of 05/14/2016  5:23 AM     I personally performed the services described in this documentation, which was scribed in my presence. The recorded information has been reviewed and is accurate.     Tony Morgan, MD 05/14/16 [phone]

## 2016-05-19 ENCOUNTER — Encounter: Payer: Self-pay | Admitting: Cardiology

## 2016-05-19 ENCOUNTER — Ambulatory Visit: Payer: BC Managed Care – PPO | Admitting: Cardiology

## 2016-05-19 ENCOUNTER — Encounter: Payer: Self-pay | Admitting: *Deleted

## 2016-05-19 NOTE — Progress Notes (Deleted)
PCP: [name], MD  Clinic Note: No chief complaint on file.   HPI: Tony [name] is a 49 y.o. male with a PMH below who presents today for emergency room follow-up with complaint of chest pain.  He is a current smoker with family history of coronary disease in his brother and father both of whom had CAD diagnosed before age 55. His brother actually died prior to age 69.  Tony [name] was last seen on December 3 in the [facility] emergency room complaining of intermittent episodes of sharp chest pain of at least 8/10 intensity. Associated with lightheadedness and diaphoresis. Pain began in the left arm, and then about 2 hours later chest. No pattern of pain. No alleviating factors. EMS administered aspirin and nitroglycerin with minimal relief. He did indicate having done some heavy lifting on the days leading up to the episode pain. He ruled out for MI, and was sent home from the ER. EKG showed early repolarization changes  Recent Hospitalizations: None besides ER visits  Studies Reviewed: ***  Interval History: ***   No chest pain or shortness of breath with rest or exertion. No PND, orthopnea or edema. No palpitations, lightheadedness, dizziness, weakness or syncope/near syncope. No TIA/amaurosis fugax symptoms. No melena, hematochezia, hematuria, or epstaxis. No claudication.  ROS: A comprehensive was performed. ROS   Past Medical History:  Diagnosis Date  . Gastric ulcer due to Helicobacter pylori 0539  . GERD (gastroesophageal reflux disease)   . Hypertension   . IBS (irritable bowel syndrome)   . Mesenteric adenitis 2011   presumed/H&P  . Pneumonia 2011  . Ulcer [facility])     Past Surgical History:  Procedure Laterality Date  . ESOPHAGOGASTRODUODENOSCOPY  08/22/2011   Procedure: ESOPHAGOGASTRODUODENOSCOPY (EGD);  Surgeon: [name], MD;  Location: [place];  Service: Gastroenterology;  Laterality: N/A;  . KNEE ARTHROSCOPY     left    No outpatient  prescriptions have been marked as taking for the 05/19/16 encounter (Appointment) with Leonie Man, MD.    Allergies  Allergen Reactions  . Morphine And Related Other (See Comments)    hallucinations    Social History   Social History  . Marital status: Married    Spouse name: N/A  . Number of children: 3  . Years of education: N/A   Occupational History  . 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 Smoking status: Current Every Day Smoker    Packs/day: 1.00    Years: 10.00    Types: Cigarettes  . Smokeless tobacco: Never Used  . Alcohol use 4.2 oz/week    7 Cans of beer per week     Comment: beer  . Drug use: No     Comment: 08/14/11 "last drug use ~ 30 yr ago"  . Sexual activity: Not on file   Other Topics Concern  . Not on file   Social History Narrative   Lives with wife and two children.  Administrator at [employer]    family history includes Heart disease in his father; Hypertension in his father and mother.  Wt Readings from Last 3 Encounters:  05/14/16 83.9 kg (185 lb)  11/07/15 83 kg (183 lb)  08/19/15 84.8 kg (187 lb)    PHYSICAL EXAM There were no vitals taken for this visit. General appearance: alert, cooperative, appears stated age, no distress and *** obese Neck: no adenopathy, no carotid bruit and no JVD Lungs: clear to auscultation bilaterally, normal percussion bilaterally  and non-labored Heart: regular rate and rhythm, S1, S2 normal, no murmur, click, rub or gallop *** Abdomen: soft, non-tender; bowel sounds normal; no masses,  no organomegaly; *** Extremities: extremities normal, atraumatic, no cyanosis, and edema *** Pulses: 2+ and symmetric; *** Skin: {normal findings:33173} or {abnormal findings:33192} Neurologic: Mental status: Alert, oriented, thought content appropriate Cranial nerves: normal (II-XII grossly intact)    Adult ECG Report  Rate: *** ;  Rhythm: {rhythm:17366};   Narrative Interpretation: ***   Other studies  Reviewed: Additional studies/ records that were reviewed today include:  Recent Labs:  ***     ASSESSMENT / PLAN: Problem List Items Addressed This Visit    None      Current medicines are reviewed at length with the patient today. (+/- concerns) *** The following changes have been made: ***  There are no Patient Instructions on file for this visit.  Studies Ordered:   No orders of the defined types were placed in this encounter.     Glenetta Hew, M.D., M.S. Interventional Cardiologist   Pager # [phone] Phone # [phone] [address] [place], [place] [address]

## 2016-08-11 ENCOUNTER — Other Ambulatory Visit: Payer: Self-pay | Admitting: Family Medicine

## 2016-08-11 ENCOUNTER — Ambulatory Visit
Admission: RE | Admit: 2016-08-11 | Discharge: 2016-08-11 | Disposition: A | Discharge status: Home / Self Care | Payer: BC Managed Care – PPO | Source: Ambulatory Visit | Attending: Family Medicine | Admitting: Family Medicine

## 2016-08-11 DIAGNOSIS — R0781 Pleurodynia: Secondary | ICD-10-CM

## 2016-12-26 ENCOUNTER — Other Ambulatory Visit: Payer: Self-pay

## 2016-12-26 ENCOUNTER — Emergency Department (HOSPITAL_COMMUNITY)
Admission: EM | Admit: 2016-12-26 | Discharge: 2016-12-27 | Disposition: A | Discharge status: Home / Self Care | Payer: BC Managed Care – PPO | Attending: Emergency Medicine | Admitting: Emergency Medicine

## 2016-12-26 ENCOUNTER — Emergency Department (HOSPITAL_COMMUNITY): Payer: BC Managed Care – PPO

## 2016-12-26 ENCOUNTER — Encounter (HOSPITAL_COMMUNITY): Payer: Self-pay | Admitting: Family Medicine

## 2016-12-26 ENCOUNTER — Encounter: Payer: Self-pay | Admitting: Internal Medicine

## 2016-12-26 DIAGNOSIS — I10 Essential (primary) hypertension: Secondary | ICD-10-CM | POA: Diagnosis not present

## 2016-12-26 DIAGNOSIS — Z79899 Other long term (current) drug therapy: Secondary | ICD-10-CM | POA: Insufficient documentation

## 2016-12-26 DIAGNOSIS — F17210 Nicotine dependence, cigarettes, uncomplicated: Secondary | ICD-10-CM | POA: Diagnosis not present

## 2016-12-26 DIAGNOSIS — F410 Panic disorder [episodic paroxysmal anxiety] without agoraphobia: Secondary | ICD-10-CM | POA: Diagnosis not present

## 2016-12-26 DIAGNOSIS — R0602 Shortness of breath: Secondary | ICD-10-CM | POA: Diagnosis present

## 2016-12-26 LAB — GLUCOSE, CAPILLARY: GLUCOSE-CAPILLARY: 87 mg/dL (ref 65–99)

## 2016-12-26 NOTE — ED Triage Notes (Signed)
Patient reports tonight he took Robaxin and a cup of coffee. Afterwards, about an hour, he developed shortness of breath with difficulty to breath and near syncopal feeling. Patient reports this happened about a month ago while taking a Robaxin but then yesterday he had the same symptoms without taking the Robaxin. Patient is calm, cooperative. Respirations are even, regular, and unlabored. Talking in full sentences.

## 2016-12-27 ENCOUNTER — Other Ambulatory Visit: Payer: Self-pay

## 2016-12-27 LAB — CBC
HCT: 39.8 % (ref 39.0–52.0)
Hemoglobin: 14 g/dL (ref 13.0–17.0)
MCH: 30.9 pg (ref 26.0–34.0)
MCHC: 35.2 g/dL (ref 30.0–36.0)
MCV: 87.9 fL (ref 78.0–100.0)
PLATELETS: 290 10*3/uL (ref 150–400)
RBC: 4.53 MIL/uL (ref 4.22–5.81)
RDW: 12.4 % (ref 11.5–15.5)
WBC: 8.5 10*3/uL (ref 4.0–10.5)

## 2016-12-27 LAB — BASIC METABOLIC PANEL
Anion gap: 8 (ref 5–15)
BUN: 10 mg/dL (ref 6–20)
CHLORIDE: 102 mmol/L (ref 101–111)
CO2: 27 mmol/L (ref 22–32)
CREATININE: 1.08 mg/dL (ref 0.61–1.24)
Calcium: 9 mg/dL (ref 8.9–10.3)
GFR calc Af Amer: >60 mL/min (ref >60)
GLUCOSE: 94 mg/dL (ref 65–99)
Potassium: 3.2 mmol/L — ABNORMAL LOW (ref 3.5–5.1)
SODIUM: 137 mmol/L (ref 135–145)

## 2016-12-27 LAB — POCT I-STAT TROPONIN I: Troponin i, poc: 0 ng/mL (ref 0.00–0.08)

## 2016-12-27 LAB — CG4 I-STAT (LACTIC ACID): Lactic Acid, Venous: 0.68 mmol/L (ref 0.5–1.9)

## 2016-12-27 MED ORDER — LORAZEPAM 1 MG PO TABS: ORAL_TABLET | ORAL | 0 refills | Status: DC

## 2016-12-27 NOTE — Discharge Instructions (Signed)
Follow-up with cardiology.  Call for a follow-up appointment.

## 2016-12-27 NOTE — ED Provider Notes (Signed)
[facility] DEPT Provider Note   CSN: [id number] Arrival date & time: 12/26/16  2235  By signing my name below, I, Mayer Masker, attest that this documentation has been prepared under the direction and in the presence of Tanna Furry, MD. Electronically Signed: Mayer Masker, Scribe. 12/27/16. 1:52 AM. History   Chief Complaint Chief Complaint  Patient presents with  . Shortness of Breath  . Near Syncope  The history is provided by the patient. No language interpreter was used.    HPI Comments: Tony [name] is a 52 y.o. male with PMHx of HTN and HLD who presents to the Emergency Department complaining of waxing/waning, gradually worsening SOB that began prior to arrival. He has associated light-headedness, numbness in his left fingers, feeling warm and as if he might faint, and feeling as if "something is about to happen". He states this issue has been going on for about 1 month. He reports the episodes do not last very long. He states he was reading a book when it occurred. He denies palpitations. Pt reports seeing somebody for depression/anxiety before several years ago. Pt further reports being stressed and has had trouble difficulty sleeping due to an injury to his back in February. Pt is a smoker. He denies use of street drugs and drinking. He has a FMHx of parent and brother dying due to MI. Pt takes medications regularly for his HTN and HLD.  Past Medical History:  Diagnosis Date  . Gastric ulcer due to Helicobacter pylori 7106  . GERD (gastroesophageal reflux disease)   . Hypertension   . IBS (irritable bowel syndrome)   . Mesenteric adenitis 2011   presumed/H&P  . Pneumonia 2011  . Ulcer     Patient Active Problem List   Diagnosis Date Noted  . H. pylori infection 10/11/2011  . Duodenal ulcer 08/23/2011  . IBS (irritable bowel syndrome) 08/18/2011  . DEPRESSION 05/11/2009  . EPIGASTRIC PAIN, CHRONIC 05/11/2009  . SMOKER 09/01/2008  . INSOMNIA, CHRONIC 09/01/2008  .  ESSENTIAL HYPERTENSION 09/01/2008  . PALPITATIONS, CHRONIC 09/01/2008  . GASTRITIS 10/24/2007    Past Surgical History:  Procedure Laterality Date  . ESOPHAGOGASTRODUODENOSCOPY  08/22/2011   Procedure: ESOPHAGOGASTRODUODENOSCOPY (EGD);  Surgeon: [name], MD;  Location: [facility];  Service: Gastroenterology;  Laterality: N/A;  . KNEE ARTHROSCOPY     left       Home Medications    Prior to Admission medications   Medication Sig Start Date End Date Taking? Authorizing Provider  atorvastatin (LIPITOR) 10 MG tablet Take 10 mg by mouth daily.    [provider]  cephALEXin (KEFLEX) 500 MG capsule Take 1 capsule (500 mg total) by mouth 4 (four) times daily. Patient not taking: Reported on 05/14/2016 11/07/15   [name], PA-C  diazepam (VALIUM) 5 MG tablet Take 1 tablet (5 mg total) by mouth 2 (two) times daily. Patient not taking: Reported on 05/14/2016 01/19/16   [name], PA-C  hydrochlorothiazide (HYDRODIURIL) 25 MG tablet Take 25 mg by mouth daily.    [provider]  HYDROcodone-acetaminophen (NORCO/VICODIN) 5-325 MG tablet Take 1-2 tablets by mouth every 6 (six) hours as needed. Patient not taking: Reported on 05/14/2016 11/07/15   [name], PA-C  LORazepam (ATIVAN) 1 MG tablet 1 daily at bedtime when necessary sleep 1 every 8 hours when necessary anxiety/panic attack 12/27/16   Tanna Furry, MD  methocarbamol (ROBAXIN) 500 MG tablet Take 500 mg by mouth every 6 (six) hours as needed for muscle spasms.  [provider]  metoprolol succinate (TOPROL-XL) 100 MG 24 hr tablet Take 100 mg by mouth at bedtime. Take with or immediately following a meal.    [provider]  ondansetron (ZOFRAN) 4 MG tablet Take 1 tablet every 6 hours as needed for nausea. Patient not taking: Reported on 05/14/2016 08/16/15   Esterwood, Amy S, PA-C  oxyCODONE-acetaminophen (PERCOCET/ROXICET) 5-325 MG tablet Take 1 tablet by mouth every 4 (four)  hours as needed. Patient not taking: Reported on 05/14/2016 01/19/16   [name], PA-C  pantoprazole (PROTONIX) 40 MG tablet Take 1 tablet (40 mg total) by mouth 2 (two) times daily. 08/16/15   Esterwood, Amy S, PA-C  sucralfate (CARAFATE) 1 GM/10ML suspension Take 10 mLs (1 g total) by mouth 4 (four) times daily -  with meals and at bedtime. Patient not taking: Reported on 05/14/2016 08/16/15   Alfredia Ferguson, PA-C    Family History Family History  Problem Relation Age of Onset  . Hypertension Mother   . Hypertension Father   . Coronary artery disease Father 54  . Heart attack Brother   . Coronary artery disease Brother   . Diabetes Neg Hx   . Stroke Neg Hx     Social History Social History  Substance Use Topics  . Smoking status: Current Every Day Smoker    Packs/day: 1.00    Years: 10.00    Types: Cigarettes  . Smokeless tobacco: Never Used  . Alcohol use Yes     Comment: Wine. Once every 1-2 months.      Allergies   Morphine and related   Review of Systems Review of Systems  Constitutional: Negative for appetite change, chills, diaphoresis, fatigue and fever.  HENT: Negative for mouth sores, sore throat and trouble swallowing.   Eyes: Negative for visual disturbance.  Respiratory: Positive for shortness of breath. Negative for cough, chest tightness and wheezing.   Cardiovascular: Negative for chest pain.  Gastrointestinal: Negative for abdominal distention, abdominal pain, diarrhea, nausea and vomiting.  Endocrine: Negative for polydipsia, polyphagia and polyuria.  Genitourinary: Negative for dysuria, frequency and hematuria.  Musculoskeletal: Negative for gait problem.  Skin: Negative for color change, pallor and rash.  Neurological: Positive for light-headedness and numbness. Negative for dizziness, syncope and headaches.  Hematological: Does not bruise/bleed easily.  Psychiatric/Behavioral: Negative for behavioral problems and confusion. The patient  is nervous/anxious.   All other systems reviewed and are negative.    Physical Exam Updated Vital Signs BP (!) 143/85   Pulse 87   Temp 97.8 F (36.6 C) (Oral)   Resp 20   Ht 6\' 1"  (1.854 m)   Wt 81.6 kg (180 lb)   SpO2 98%   BMI 23.75 kg/m   Physical Exam  Constitutional: He is oriented to person, place, and time. He appears well-developed and well-nourished. No distress.  HENT:  Head: Normocephalic.  Eyes: Pupils are equal, round, and reactive to light. Conjunctivae are normal. No scleral icterus.  Neck: Normal range of motion. Neck supple. No thyromegaly present.  Cardiovascular: Normal rate and regular rhythm.  Exam reveals no gallop and no friction rub.   No murmur heard. Pulmonary/Chest: Effort normal and breath sounds normal. No respiratory distress. He has no wheezes. He has no rales.  Abdominal: Soft. Bowel sounds are normal. He exhibits no distension. There is no tenderness. There is no rebound.  Musculoskeletal: Normal range of motion.  Neurological: He is alert and oriented to person, place, and time.  Skin: Skin  is warm and dry. No rash noted.  Psychiatric: He has a normal mood and affect. His behavior is normal.     ED Treatments / Results  DIAGNOSTIC STUDIES: Oxygen Saturation is 98% on RA, normal by my interpretation.    COORDINATION OF CARE: 1:51 AM Discussed treatment plan with pt at bedside and pt agreed to plan.  Labs (all labs ordered are listed, but only abnormal results are displayed) Labs Reviewed  BASIC METABOLIC PANEL - Abnormal; Notable for the following:       Result Value   Potassium 3.2 (*)    All other components within normal limits  CBC  GLUCOSE, CAPILLARY  URINALYSIS, ROUTINE W REFLEX MICROSCOPIC  CBG MONITORING, ED  I-STAT TROPOININ, ED  I-STAT CG4 LACTIC ACID, ED  CG4 I-STAT (LACTIC ACID)  POCT I-STAT TROPONIN I    EKG  EKG Interpretation  Date/Time:  Wednesday December 27 2016 00:09:12 EDT Ventricular Rate:  60 PR  Interval:    QRS Duration: 85 QT Interval:  427 QTC Calculation: 427 R Axis:   67 Text Interpretation:  Sinus rhythm Confirmed by Tanna Furry [phone]) on 12/27/2016 1:02:37 AM       Radiology Dg Chest 2 View  Result Date: 12/26/2016 CLINICAL DATA:  Initial evaluation for acute onset shortness of breath. EXAM: CHEST  2 VIEW COMPARISON:  Prior radiograph from 08/11/2016. FINDINGS: The cardiac and mediastinal silhouettes are stable in size and contour, and remain within normal limits. The lungs are normally inflated. No airspace consolidation, pleural effusion, or pulmonary edema is identified. There is no pneumothorax. No acute osseous abnormality identified. IMPRESSION: No active cardiopulmonary disease. Electronically Signed   By: Jeannine Boga M.D.   On: 12/26/2016 23:43    Procedures Procedures (including critical care time)  Medications Ordered in ED Medications - No data to display   Initial Impression / Assessment and Plan / ED Course  I have reviewed the triage vital signs and the nursing notes.  Pertinent labs & imaging results that were available during my care of the patient were reviewed by me and considered in my medical decision making (see chart for details).    Symptoms consistent with panic attack. Patient has a brother, and father that died from heart disease. Encouraged her to follow-up with cardiology regarding routine stress testing. However, symptoms today do not certainly as being cardiac in etiology. He has been under stress and sleeping poorly. He has a normal EKG and troponin here. Normal exam and symptom resolution. These have been paroxysmal and episodic, consistent with panic attacks  Final Clinical Impressions(s) / ED Diagnoses   Final diagnoses:  Panic attack    New Prescriptions New Prescriptions   LORAZEPAM (ATIVAN) 1 MG TABLET    1 daily at bedtime when necessary sleep 1 every 8 hours when necessary anxiety/panic attack  I personally  performed the services described in this documentation, which was scribed in my presence. The recorded information has been reviewed and is accurate.     Tanna Furry, MD 12/27/16 [phone]

## 2017-11-04 DIAGNOSIS — G8929 Other chronic pain: Secondary | ICD-10-CM | POA: Insufficient documentation

## 2017-11-04 DIAGNOSIS — M549 Dorsalgia, unspecified: Secondary | ICD-10-CM

## 2017-11-10 ENCOUNTER — Observation Stay (HOSPITAL_COMMUNITY)
Admission: EM | Admit: 2017-11-10 | Discharge: 2017-11-11 | Disposition: A | Discharge status: Home / Self Care | Payer: BLUE CROSS/BLUE SHIELD | Attending: Vascular Surgery | Admitting: Vascular Surgery

## 2017-11-10 ENCOUNTER — Other Ambulatory Visit: Payer: Self-pay

## 2017-11-10 ENCOUNTER — Encounter (HOSPITAL_COMMUNITY): Payer: Self-pay | Admitting: Emergency Medicine

## 2017-11-10 DIAGNOSIS — Z79899 Other long term (current) drug therapy: Secondary | ICD-10-CM | POA: Diagnosis not present

## 2017-11-10 DIAGNOSIS — M79671 Pain in right foot: Secondary | ICD-10-CM | POA: Diagnosis present

## 2017-11-10 DIAGNOSIS — I70202 Unspecified atherosclerosis of native arteries of extremities, left leg: Secondary | ICD-10-CM | POA: Diagnosis not present

## 2017-11-10 DIAGNOSIS — K219 Gastro-esophageal reflux disease without esophagitis: Secondary | ICD-10-CM | POA: Diagnosis not present

## 2017-11-10 DIAGNOSIS — E7800 Pure hypercholesterolemia, unspecified: Secondary | ICD-10-CM | POA: Diagnosis not present

## 2017-11-10 DIAGNOSIS — I10 Essential (primary) hypertension: Secondary | ICD-10-CM | POA: Diagnosis not present

## 2017-11-10 DIAGNOSIS — I70209 Unspecified atherosclerosis of native arteries of extremities, unspecified extremity: Secondary | ICD-10-CM

## 2017-11-10 DIAGNOSIS — I739 Peripheral vascular disease, unspecified: Secondary | ICD-10-CM | POA: Diagnosis present

## 2017-11-10 DIAGNOSIS — F17210 Nicotine dependence, cigarettes, uncomplicated: Secondary | ICD-10-CM | POA: Insufficient documentation

## 2017-11-10 NOTE — ED Triage Notes (Signed)
Patient is complaining of foot pain. The toes are purple on most of the toes on both feet. Patient states this started three days ago.

## 2017-11-11 ENCOUNTER — Observation Stay (HOSPITAL_COMMUNITY): Payer: BLUE CROSS/BLUE SHIELD

## 2017-11-11 DIAGNOSIS — E7800 Pure hypercholesterolemia, unspecified: Secondary | ICD-10-CM | POA: Diagnosis not present

## 2017-11-11 DIAGNOSIS — I70202 Unspecified atherosclerosis of native arteries of extremities, left leg: Secondary | ICD-10-CM | POA: Diagnosis not present

## 2017-11-11 DIAGNOSIS — K219 Gastro-esophageal reflux disease without esophagitis: Secondary | ICD-10-CM | POA: Diagnosis not present

## 2017-11-11 DIAGNOSIS — I10 Essential (primary) hypertension: Secondary | ICD-10-CM | POA: Diagnosis not present

## 2017-11-11 DIAGNOSIS — I739 Peripheral vascular disease, unspecified: Secondary | ICD-10-CM | POA: Diagnosis not present

## 2017-11-11 DIAGNOSIS — F17210 Nicotine dependence, cigarettes, uncomplicated: Secondary | ICD-10-CM | POA: Diagnosis not present

## 2017-11-11 DIAGNOSIS — M79671 Pain in right foot: Secondary | ICD-10-CM | POA: Diagnosis present

## 2017-11-11 DIAGNOSIS — Z79899 Other long term (current) drug therapy: Secondary | ICD-10-CM | POA: Diagnosis not present

## 2017-11-11 LAB — CREATININE, SERUM
Creatinine, Ser: 1.05 mg/dL (ref 0.61–1.24)
GFR calc non Af Amer: >60 mL/min (ref >60)

## 2017-11-11 LAB — CBC WITH DIFFERENTIAL/PLATELET
Basophils Absolute: 0.1 10*3/uL (ref 0.0–0.1)
Basophils Relative: 1 %
Eosinophils Absolute: 0.7 10*3/uL (ref 0.0–0.7)
Eosinophils Relative: 7 %
HEMATOCRIT: 38.8 % — AB (ref 39.0–52.0)
HEMOGLOBIN: 13.1 g/dL (ref 13.0–17.0)
LYMPHS PCT: 21 %
Lymphs Abs: 2.1 10*3/uL (ref 0.7–4.0)
MCH: 30.9 pg (ref 26.0–34.0)
MCHC: 33.8 g/dL (ref 30.0–36.0)
MCV: 91.5 fL (ref 78.0–100.0)
MONOS PCT: 7 %
Monocytes Absolute: 0.7 10*3/uL (ref 0.1–1.0)
NEUTROS ABS: 6.6 10*3/uL (ref 1.7–7.7)
NEUTROS PCT: 64 %
Platelets: 236 10*3/uL (ref 150–400)
RBC: 4.24 MIL/uL (ref 4.22–5.81)
RDW: 12.6 % (ref 11.5–15.5)
WBC: 10.1 10*3/uL (ref 4.0–10.5)

## 2017-11-11 LAB — CBC
HCT: 39.1 % (ref 39.0–52.0)
Hemoglobin: 12.7 g/dL — ABNORMAL LOW (ref 13.0–17.0)
MCH: 30.3 pg (ref 26.0–34.0)
MCHC: 32.5 g/dL (ref 30.0–36.0)
MCV: 93.3 fL (ref 78.0–100.0)
PLATELETS: 229 10*3/uL (ref 150–400)
RBC: 4.19 MIL/uL — ABNORMAL LOW (ref 4.22–5.81)
RDW: 12.4 % (ref 11.5–15.5)
WBC: 9.1 10*3/uL (ref 4.0–10.5)

## 2017-11-11 LAB — PROTIME-INR
INR: 1.07
Prothrombin Time: 13.8 seconds (ref 11.4–15.2)

## 2017-11-11 LAB — BASIC METABOLIC PANEL
ANION GAP: 10 (ref 5–15)
BUN: 16 mg/dL (ref 6–20)
CALCIUM: 9.3 mg/dL (ref 8.9–10.3)
CHLORIDE: 105 mmol/L (ref 101–111)
CO2: 22 mmol/L (ref 22–32)
Creatinine, Ser: 1.02 mg/dL (ref 0.61–1.24)
GFR calc non Af Amer: >60 mL/min (ref >60)
Glucose, Bld: 91 mg/dL (ref 65–99)
POTASSIUM: 3.6 mmol/L (ref 3.5–5.1)
Sodium: 137 mmol/L (ref 135–145)

## 2017-11-11 LAB — URINALYSIS, ROUTINE W REFLEX MICROSCOPIC
Bilirubin Urine: NEGATIVE
GLUCOSE, UA: NEGATIVE mg/dL
HGB URINE DIPSTICK: NEGATIVE
Ketones, ur: 5 mg/dL — AB
Leukocytes, UA: NEGATIVE
Nitrite: NEGATIVE
Protein, ur: NEGATIVE mg/dL
SPECIFIC GRAVITY, URINE: 1.021 (ref 1.005–1.030)
pH: 6 (ref 5.0–8.0)

## 2017-11-11 LAB — HIV ANTIBODY (ROUTINE TESTING W REFLEX): HIV Screen 4th Generation wRfx: NONREACTIVE

## 2017-11-11 LAB — APTT: APTT: 28 s (ref 24–36)

## 2017-11-11 MED ORDER — HYDRALAZINE HCL 20 MG/ML IJ SOLN
5.0000 mg | INTRAMUSCULAR | Status: DC | PRN
Start: 2017-11-11 — End: 2017-11-11

## 2017-11-11 MED ORDER — KCL IN DEXTROSE-NACL 20-5-0.45 MEQ/L-%-% IV SOLN
INTRAVENOUS | Status: DC
  Administered 2017-11-11: 03:00:00 via INTRAVENOUS
  Filled 2017-11-11: qty 1000

## 2017-11-11 MED ORDER — GABAPENTIN 100 MG PO CAPS: 200.0000 mg | ORAL_CAPSULE | Freq: Two times a day (BID) | ORAL | 2 refills | Status: DC

## 2017-11-11 MED ORDER — HYDRALAZINE HCL 20 MG/ML IJ SOLN: 5.0000 mg | INTRAMUSCULAR | Status: DC | PRN

## 2017-11-11 MED ORDER — TRAMADOL HCL 50 MG PO TABS: 50.0000 mg | ORAL_TABLET | Freq: Four times a day (QID) | ORAL | 0 refills | Status: DC

## 2017-11-11 MED ORDER — ONDANSETRON HCL 4 MG/2ML IJ SOLN: 4.0000 mg | Freq: Four times a day (QID) | INTRAMUSCULAR | Status: DC | PRN

## 2017-11-11 MED ORDER — PHENOL 1.4 % MT LIQD: 1.0000 | OROMUCOSAL | Status: DC | PRN

## 2017-11-11 MED ORDER — ACETAMINOPHEN 650 MG RE SUPP: 325.0000 mg | RECTAL | Status: DC | PRN

## 2017-11-11 MED ORDER — IOPAMIDOL (ISOVUE-370) INJECTION 76%
100.0000 mL | Freq: Once | INTRAVENOUS | Status: AC | PRN
  Administered 2017-11-11: 100 mL via INTRAVENOUS

## 2017-11-11 MED ORDER — HEPARIN BOLUS VIA INFUSION
5000.0000 [IU] | Freq: Once | INTRAVENOUS | Status: AC
  Administered 2017-11-11: 5000 [IU] via INTRAVENOUS
  Filled 2017-11-11: qty 5000

## 2017-11-11 MED ORDER — LABETALOL HCL 5 MG/ML IV SOLN
10.0000 mg | INTRAVENOUS | Status: DC | PRN
Start: 2017-11-11 — End: 2017-11-11
  Filled 2017-11-11: qty 4

## 2017-11-11 MED ORDER — GUAIFENESIN-DM 100-10 MG/5ML PO SYRP: 15.0000 mL | ORAL_SOLUTION | ORAL | Status: DC | PRN

## 2017-11-11 MED ORDER — PANTOPRAZOLE SODIUM 40 MG PO TBEC: 40.0000 mg | DELAYED_RELEASE_TABLET | Freq: Every day | ORAL | Status: DC

## 2017-11-11 MED ORDER — ENOXAPARIN SODIUM 40 MG/0.4ML ~~LOC~~ SOLN
40.0000 mg | SUBCUTANEOUS | Status: DC
  Administered 2017-11-11: 40 mg via SUBCUTANEOUS
  Filled 2017-11-11: qty 0.4

## 2017-11-11 MED ORDER — FENTANYL CITRATE (PF) 100 MCG/2ML IJ SOLN
100.0000 ug | Freq: Once | INTRAMUSCULAR | Status: AC
  Administered 2017-11-11: 100 ug via INTRAVENOUS
  Filled 2017-11-11: qty 2

## 2017-11-11 MED ORDER — HYDROMORPHONE HCL 1 MG/ML IJ SOLN: 0.5000 mg | INTRAMUSCULAR | Status: DC | PRN

## 2017-11-11 MED ORDER — PANTOPRAZOLE SODIUM 40 MG PO TBEC
40.0000 mg | DELAYED_RELEASE_TABLET | Freq: Every day | ORAL | Status: DC
  Administered 2017-11-11: 40 mg via ORAL
  Filled 2017-11-11: qty 1

## 2017-11-11 MED ORDER — KCL IN DEXTROSE-NACL 20-5-0.45 MEQ/L-%-% IV SOLN: INTRAVENOUS | Status: DC

## 2017-11-11 MED ORDER — PNEUMOCOCCAL VAC POLYVALENT 25 MCG/0.5ML IJ INJ: 0.5000 mL | INJECTION | INTRAMUSCULAR | Status: DC | PRN

## 2017-11-11 MED ORDER — GABAPENTIN 100 MG PO CAPS
200.0000 mg | ORAL_CAPSULE | Freq: Two times a day (BID) | ORAL | Status: DC
  Administered 2017-11-11: 200 mg via ORAL
  Filled 2017-11-11: qty 2

## 2017-11-11 MED ORDER — ENOXAPARIN SODIUM 40 MG/0.4ML ~~LOC~~ SOLN: 40.0000 mg | SUBCUTANEOUS | Status: DC

## 2017-11-11 MED ORDER — ALUM & MAG HYDROXIDE-SIMETH 200-200-20 MG/5ML PO SUSP: 15.0000 mL | ORAL | Status: DC | PRN

## 2017-11-11 MED ORDER — METOPROLOL TARTRATE 5 MG/5ML IV SOLN: 2.0000 mg | INTRAVENOUS | Status: DC | PRN

## 2017-11-11 MED ORDER — POTASSIUM CHLORIDE CRYS ER 20 MEQ PO TBCR: 20.0000 meq | EXTENDED_RELEASE_TABLET | Freq: Once | ORAL | Status: DC

## 2017-11-11 MED ORDER — HYDROMORPHONE HCL 2 MG/ML IJ SOLN
0.5000 mg | INTRAMUSCULAR | Status: DC | PRN
  Administered 2017-11-11 (×3): 1 mg via INTRAVENOUS
  Filled 2017-11-11 (×3): qty 1

## 2017-11-11 MED ORDER — LABETALOL HCL 5 MG/ML IV SOLN: 10.0000 mg | INTRAVENOUS | Status: DC | PRN

## 2017-11-11 MED ORDER — HEPARIN (PORCINE) IN NACL 100-0.45 UNIT/ML-% IJ SOLN
1400.0000 [IU]/h | INTRAMUSCULAR | Status: DC
  Administered 2017-11-11: 1400 [IU]/h via INTRAVENOUS
  Filled 2017-11-11: qty 250

## 2017-11-11 MED ORDER — TRAMADOL HCL 50 MG PO TABS
50.0000 mg | ORAL_TABLET | Freq: Four times a day (QID) | ORAL | Status: DC
  Administered 2017-11-11: 50 mg via ORAL
  Filled 2017-11-11: qty 1

## 2017-11-11 MED ORDER — ACETAMINOPHEN 325 MG PO TABS: 325.0000 mg | ORAL_TABLET | ORAL | Status: DC | PRN

## 2017-11-11 NOTE — H&P (Signed)
Patient name: Tony [name] MRN: [id number] DOB: 11-03-1965 Sex: male   REASON FOR CONSULT:    Cold right leg.  The consult is requested by Dr. Ripley Fraise  HPI:   Tony [name] is a pleasant 52 y.o. male, who presented to the [facility] emergency department complaining of right foot pain.  He was noted there to have a cold right foot and the physician at [facility] long felt that the patient had an ischemic right foot.  I asked that the patient be transferred to [facility] to the operating room holding area given that he would likely require surgery.  On my history, the patient noted the gradual onset of burning pain in the toes of his right foot 3 days ago.  This pain has been persistent and for this reason the patient presented to the emergency department.  In addition his wife had felt that the foot was cool.  Prior to these events I do not get any clear-cut history of claudication or rest pain.  He denies any history of arrhythmias or recent cardiac events.  He denies any previous history of myocardial infarction or history of congestive heart failure.  The patient's risk factors for peripheral vascular disease include hypertension, hypercholesterolemia, a family history of premature cardiovascular disease, and tobacco use.  He denies any history of diabetes.  Past Medical History:  Diagnosis Date  . Gastric ulcer due to Helicobacter pylori 0938  . GERD (gastroesophageal reflux disease)   . Hypertension   . IBS (irritable bowel syndrome)   . Mesenteric adenitis 2011   presumed/H&P  . Pneumonia 2011  . Ulcer     Family History  Problem Relation Age of Onset  . Hypertension Mother   . Hypertension Father   . Coronary artery disease Father 64  . Heart attack Brother   . Coronary artery disease Brother   . Diabetes Neg Hx   . Stroke Neg Hx     SOCIAL HISTORY: Social History   Socioeconomic History  . Marital status: Married    Spouse name: Not on file  . Number  of children: 3  . Years of education: Not on file  . Highest education level: Not on file  Occupational History  . Occupation: Admin. Assistant    Employer: [employer]  . Financial resource strain: Not on file  . Food insecurity:    Worry: Not on file    Inability: Not on file  . Transportation needs:    Medical: Not on file    Non-medical: Not on file  Tobacco Use  . Smoking status: Current Every Day Smoker    Packs/day: 1.00    Years: 10.00    Pack years: 10.00    Types: Cigarettes  . Smokeless tobacco: Never Used  Substance and Sexual Activity  . Alcohol use: Yes    Comment: Wine. Once every 1-2 months.   . Drug use: No    Types: Marijuana    Comment: 08/14/11 "last drug use ~ 30 yr ago"  . Sexual activity: Not on file  Lifestyle  . Physical activity:    Days per week: Not on file    Minutes per session: Not on file  . Stress: Not on file  Relationships  . Social connections:    Talks on phone: Not on file    Gets together: Not on file    Attends religious service: Not on file    Active member of club or  organization: Not on file    Attends meetings of clubs or organizations: Not on file    Relationship status: Not on file  . Intimate partner violence:    Fear of current or ex partner: Not on file    Emotionally abused: Not on file    Physically abused: Not on file    Forced sexual activity: Not on file  Other Topics Concern  . Not on file  Social History Narrative   Lives with wife and two children.  Administrator at [employer]  . Morphine And Related Other (See Comments)    hallucinations    Current Facility-Administered Medications  Medication Dose Route Frequency Provider Last Rate Last Dose  . heparin ADULT infusion 100 units/mL (25000 units/234mL sodium chloride 0.45%)  1,400 Units/hr Intravenous Continuous Dorrene German, RPH 14 mL/hr at 11/11/17 0042 1,400 Units/hr at 11/11/17 3151   Current Outpatient  Medications  Medication Sig Dispense Refill  . atorvastatin (LIPITOR) 10 MG tablet Take 10 mg by mouth daily.    . cephALEXin (KEFLEX) 500 MG capsule Take 1 capsule (500 mg total) by mouth 4 (four) times daily. (Patient not taking: Reported on 05/14/2016) 28 capsule 0  . diazepam (VALIUM) 5 MG tablet Take 1 tablet (5 mg total) by mouth 2 (two) times daily. (Patient not taking: Reported on 05/14/2016) 10 tablet 0  . hydrochlorothiazide (HYDRODIURIL) 25 MG tablet Take 25 mg by mouth daily.    Marland Kitchen HYDROcodone-acetaminophen (NORCO/VICODIN) 5-325 MG tablet Take 1-2 tablets by mouth every 6 (six) hours as needed. (Patient not taking: Reported on 05/14/2016) 10 tablet 0  . LORazepam (ATIVAN) 1 MG tablet 1 daily at bedtime when necessary sleep 1 every 8 hours when necessary anxiety/panic attack 10 tablet 0  . methocarbamol (ROBAXIN) 500 MG tablet Take 500 mg by mouth every 6 (six) hours as needed for muscle spasms.    . metoprolol succinate (TOPROL-XL) 100 MG 24 hr tablet Take 100 mg by mouth at bedtime. Take with or immediately following a meal.    . ondansetron (ZOFRAN) 4 MG tablet Take 1 tablet every 6 hours as needed for nausea. (Patient not taking: Reported on 05/14/2016) 50 tablet 1  . oxyCODONE-acetaminophen (PERCOCET/ROXICET) 5-325 MG tablet Take 1 tablet by mouth every 4 (four) hours as needed. (Patient not taking: Reported on 05/14/2016) 15 tablet 0  . pantoprazole (PROTONIX) 40 MG tablet Take 1 tablet (40 mg total) by mouth 2 (two) times daily. 90 tablet 3  . sucralfate (CARAFATE) 1 GM/10ML suspension Take 10 mLs (1 g total) by mouth 4 (four) times daily -  with meals and at bedtime. (Patient not taking: Reported on 05/14/2016) 420 mL 1    REVIEW OF SYSTEMS:  [X]  denotes positive finding, [ ]  denotes negative finding Cardiac  Comments:  Chest pain or chest pressure:    Shortness of breath upon exertion:    Short of breath when lying flat:    Irregular heart rhythm:        Vascular    Pain in  calf, thigh, or hip brought on by ambulation:    Pain in feet at night that wakes you up from your sleep:     Blood clot in your veins:    Leg swelling:         Pulmonary    Oxygen at home:    Productive cough:     Wheezing:         Neurologic  Sudden weakness in arms or legs:     Sudden numbness in arms or legs:     Sudden onset of difficulty speaking or slurred speech:    Temporary loss of vision in one eye:     Problems with dizziness:         Gastrointestinal    Blood in stool:     Vomited blood:         Genitourinary    Burning when urinating:     Blood in urine:        Psychiatric    Major depression:         Hematologic    Bleeding problems:    Problems with blood clotting too easily:        Skin    Rashes or ulcers:        Constitutional    Fever or chills:     PHYSICAL EXAM:   Vitals:   11/10/17 2332 11/10/17 2338 11/11/17 0018 11/11/17 0034  BP: (!) 155/79  (!) 163/83 (!) 157/89  Pulse: 84  77 71  Resp: 18  (!) 24 18  Temp: 99.2 F (37.3 C)   99.2 F (37.3 C)  TempSrc: Oral   Oral  SpO2: 99%  99% 94%  Weight:  185 lb (83.9 kg)    Height:  6\' 1"  (1.854 m)      GENERAL: The patient is a well-nourished male, in no acute distress. The vital signs are documented above. CARDIAC: There is a regular rate and rhythm.  VASCULAR: I do not detect carotid bruits. He has a diminished but palpable right femoral pulse.  I cannot palpate a left femoral pulse. He has a monophasic right posterior tibial signal with the Doppler. He has a monophasic left posterior tibial and dorsalis pedis signal with the Doppler. On my exam the right foot is not significantly cooler than the left foot.  Both feet appear to have reasonable perfusion. PULMONARY: There is good air exchange bilaterally without wheezing or rales. ABDOMEN: Soft and non-tender with normal pitched bowel sounds.  I do not palpate an abdominal aortic aneurysm. MUSCULOSKELETAL: There are no major  deformities or cyanosis. NEUROLOGIC: No focal weakness or paresthesias are detected. SKIN: There are no ulcers or rashes noted. PSYCHIATRIC: The patient has a normal affect.  DATA:    LABS: Creatinine is 1.02.  GFR is greater than 60.  White blood cell count 10.1.  Hemoglobin 13.1.  Platelets 236,000.  INR is 1.07.  PTT is 28.  MEDICAL ISSUES:   RIGHT FOOT PAIN: The patient describes burning pain in the toes of his right foot which began gradually 3 days ago.  On my exam he does not appear to have an acutely ischemic right lower extremity.  He has a reasonable Doppler signal in the posterior tibial position on the right.  He does have evidence of multilevel arterial occlusive disease with a diminished right femoral pulse and an absent left femoral pulse.  He has monophasic Doppler signals in both feet.  I have recommended a CT angiogram which I have ordered.  He will be admitted to a MedSurg bed and I will make further recommendations pending these results.  Deitra Mayo [facility] [phone]

## 2017-11-11 NOTE — ED Notes (Signed)
Dr. Wickline at bedside.  

## 2017-11-11 NOTE — ED Provider Notes (Signed)
[name] Provider Note   CSN: [id number] Arrival date & time: 11/10/17  2255     History   Chief Complaint Chief Complaint  Patient presents with  . Foot Pain   Level 5 caveat due to acuity of condition HPI Tony [name] is a 52 y.o. male.  The history is provided by the patient and the spouse.  Foot Pain  This is a new problem. The current episode started more than 2 days ago. The problem occurs constantly. The problem has been rapidly worsening. The symptoms are aggravated by walking. The symptoms are relieved by rest.  patient reports onset of right foot pain in the past 2 days.  No trauma.  He reports the pain is rapidly worsening. Wife reports the foot feels cold and appears discolored   Past Medical History:  Diagnosis Date  . Gastric ulcer due to Helicobacter pylori 6010  . GERD (gastroesophageal reflux disease)   . Hypertension   . IBS (irritable bowel syndrome)   . Mesenteric adenitis 2011   presumed/H&P  . Pneumonia 2011  . Ulcer     Patient Active Problem List   Diagnosis Date Noted  . H. pylori infection 10/11/2011  . Duodenal ulcer 08/23/2011  . IBS (irritable bowel syndrome) 08/18/2011  . DEPRESSION 05/11/2009  . EPIGASTRIC PAIN, CHRONIC 05/11/2009  . SMOKER 09/01/2008  . INSOMNIA, CHRONIC 09/01/2008  . ESSENTIAL HYPERTENSION 09/01/2008  . PALPITATIONS, CHRONIC 09/01/2008  . GASTRITIS 10/24/2007    Past Surgical History:  Procedure Laterality Date  . ESOPHAGOGASTRODUODENOSCOPY  08/22/2011   Procedure: ESOPHAGOGASTRODUODENOSCOPY (EGD);  Surgeon: [name], MD;  Location: [place];  Service: Gastroenterology;  Laterality: N/A;  . KNEE ARTHROSCOPY     left        Home Medications    Prior to Admission medications   Medication Sig Start Date End Date Taking? Authorizing Provider  atorvastatin (LIPITOR) 10 MG tablet Take 10 mg by mouth daily.    [provider]  cephALEXin (KEFLEX) 500  MG capsule Take 1 capsule (500 mg total) by mouth 4 (four) times daily. Patient not taking: Reported on 05/14/2016 11/07/15   [name], PA-C  diazepam (VALIUM) 5 MG tablet Take 1 tablet (5 mg total) by mouth 2 (two) times daily. Patient not taking: Reported on 05/14/2016 01/19/16   [name], PA-C  hydrochlorothiazide (HYDRODIURIL) 25 MG tablet Take 25 mg by mouth daily.    [provider]  HYDROcodone-acetaminophen (NORCO/VICODIN) 5-325 MG tablet Take 1-2 tablets by mouth every 6 (six) hours as needed. Patient not taking: Reported on 05/14/2016 11/07/15   [name], PA-C  LORazepam (ATIVAN) 1 MG tablet 1 daily at bedtime when necessary sleep 1 every 8 hours when necessary anxiety/panic attack 12/27/16   Tanna Furry, MD  methocarbamol (ROBAXIN) 500 MG tablet Take 500 mg by mouth every 6 (six) hours as needed for muscle spasms.    [provider]  metoprolol succinate (TOPROL-XL) 100 MG 24 hr tablet Take 100 mg by mouth at bedtime. Take with or immediately following a meal.    [provider]  ondansetron (ZOFRAN) 4 MG tablet Take 1 tablet every 6 hours as needed for nausea. Patient not taking: Reported on 05/14/2016 08/16/15   Esterwood, Amy S, PA-C  oxyCODONE-acetaminophen (PERCOCET/ROXICET) 5-325 MG tablet Take 1 tablet by mouth every 4 (four) hours as needed. Patient not taking: Reported on 05/14/2016 01/19/16   [name], PA-C  pantoprazole (PROTONIX) 40 MG tablet  Take 1 tablet (40 mg total) by mouth 2 (two) times daily. 08/16/15   Esterwood, Amy S, PA-C  sucralfate (CARAFATE) 1 GM/10ML suspension Take 10 mLs (1 g total) by mouth 4 (four) times daily -  with meals and at bedtime. Patient not taking: Reported on 05/14/2016 08/16/15   Esterwood, Amy S, PA-C  atropine-PHENObarbital-scopolamine-hyoscyamine (DONNATAL) 16.2 MG tablet Take 1 tablet by mouth every 6 (six) hours as needed (for abdominal cramping). Take every 6 hours for the next 3-4  days 08/17/11 08/23/11  [name], DO    Family History Family History  Problem Relation Age of Onset  . Hypertension Mother   . Hypertension Father   . Coronary artery disease Father 18  . Heart attack Brother   . Coronary artery disease Brother   . Diabetes Neg Hx   . Stroke Neg Hx     Social History Social History   Tobacco Use  . Smoking status: Current Every Day Smoker    Packs/day: 1.00    Years: 10.00    Pack years: 10.00    Types: Cigarettes  . Smokeless tobacco: Never Used  Substance Use Topics  . Alcohol use: Yes    Comment: Wine. Once every 1-2 months.   . Drug use: No    Types: Marijuana    Comment: 08/14/11 "last drug use ~ 30 yr ago"     Allergies   Morphine and related   Review of Systems Review of Systems  Unable to perform ROS: Acuity of condition  Gastrointestinal: Negative for blood in stool.  Musculoskeletal: Positive for arthralgias.  Skin: Positive for color change.     Physical Exam Updated Vital Signs BP (!) 155/79 (BP Location: Left Arm)   Pulse 84   Temp 99.2 F (37.3 C) (Oral)   Resp 18   Ht 1.854 m (6\' 1" )   Wt 83.9 kg (185 lb)   SpO2 99%   BMI 24.41 kg/m   Physical Exam CONSTITUTIONAL: Well developed/well nourished, anxious HEAD: Normocephalic/atraumatic EYES: EOMI ENMT: Mucous membranes moist NECK: supple no meningeal signs SPINE/BACK: no Cervical spine tenderness CV: S1/S2 noted, no murmurs/rubs/gallops noted LUNGS: Lungs are clear to auscultation bilaterally, no apparent distress ABDOMEN: soft, nontender NEURO: Pt is awake/alert/appropriate, moves all extremitiesx4.  No facial droop.   EXTREMITIES: Right foot is cool to touch.  The toes appear discolored.  There is no palpable DP or PT pulse.  I am unable to obtain a dopplerable pulse of the right foot.  He has an appropriate right femoral pulse.  No right popliteal pulse is palpated. Left foot is warm to touch SKIN: Right foot is cool to touch PSYCH:  Anxious  ED Treatments / Results  Labs (all labs ordered are listed, but only abnormal results are displayed) Labs Reviewed  BASIC METABOLIC PANEL  CBC WITH DIFFERENTIAL/PLATELET  PROTIME-INR  APTT  HEPARIN LEVEL (UNFRACTIONATED)  CBC    EKG None  Radiology No results found.  Procedures Procedures   CRITICAL CARE Performed by: Sharyon Cable Total critical care time: 31 minutes Critical care time was exclusive of separately billable procedures and treating other patients. Critical care was necessary to treat or prevent imminent or life-threatening deterioration. Critical care was time spent personally by me on the following activities: development of treatment plan with patient and/or surrogate as well as nursing, discussions with consultants, evaluation of patient's response to treatment, examination of patient, obtaining history from patient or surrogate, ordering and performing treatments and interventions, ordering and  review of laboratory studies, ordering and review of radiographic studies, pulse oximetry and re-evaluation of patient's condition. Patient with concerning findings of acute arterial occlusion requiring heparin and transferred to [facility] for surgery Medications Ordered in ED Medications  heparin ADULT infusion 100 units/mL (25000 units/270mL sodium chloride 0.45%) (1,400 Units/hr Intravenous New Bag/Given 11/11/17 0042)  fentaNYL (SUBLIMAZE) injection 100 mcg (100 mcg Intravenous Given 11/11/17 0027)  heparin bolus via infusion 5,000 Units (5,000 Units Intravenous Bolus from Bag 11/11/17 0043)     Initial Impression / Assessment and Plan / ED Course  I have reviewed the triage vital signs and the nursing notes.  Pertinent labs & imaging results that were available during my care of the patient were reviewed by me and considered in my medical decision making (see chart for details).     Called to the room by the nurse due to concern for pulseless foot.   I obtained a brief history and exam was consistent with a cold right foot without an obvious pulse.  Patient was in significant pain.  I consulted vascular surgery  12:23 AM D/w dr Scot Dock with vascular surgery He accepts in transfer [facility] He requests patient go directly to the operating room holding area 12:24 AM I have discussed case with dr Roxanne Mins in the ER at [facility] He is aware of patient  I updated patient prior to transfer. Heparin was hanging prior to transfer.  Labs are pending prior to transfer, but the benefit of transfer outweighed waiting on his labs to return Final Clinical Impressions(s) / ED Diagnoses   Final diagnoses:  Arterial occlusion, lower extremity [facility])    ED Discharge Orders    None       Ripley Fraise, MD 11/11/17 [phone]

## 2017-11-11 NOTE — ED Notes (Signed)
[facility] here for transport to [facility].  

## 2017-11-11 NOTE — Plan of Care (Signed)

## 2017-11-11 NOTE — Progress Notes (Signed)
CT angiogram results are back and Dr Scot Dock is aware.

## 2017-11-11 NOTE — Progress Notes (Signed)
ANTICOAGULATION CONSULT NOTE - Initial Consult  Pharmacy Consult for IV Heparin Indication: Acute arterial occlusion  Allergies  Allergen Reactions  . Morphine And Related Other (See Comments)    hallucinations    Patient Measurements: Height: 6\' 1"  (185.4 cm) Weight: 185 lb (83.9 kg) IBW/kg (Calculated) : 79.9 Heparin Dosing Weight: actual wt  Vital Signs: Temp: 99.2 F (37.3 C) (06/01 2332) Temp Source: Oral (06/01 2332) BP: 163/83 (06/02 0018) Pulse Rate: 77 (06/02 0018)  Labs: No results for input(s): HGB, HCT, PLT, APTT, LABPROT, INR, HEPARINUNFRC, HEPRLOWMOCWT, CREATININE, CKTOTAL, CKMB, TROPONINI in the last 72 hours.  CrCl cannot be calculated (Patient's most recent lab result is older than the maximum 21 days allowed.).   Medical History: Past Medical History:  Diagnosis Date  . Gastric ulcer due to Helicobacter pylori 5953  . GERD (gastroesophageal reflux disease)   . Hypertension   . IBS (irritable bowel syndrome)   . Mesenteric adenitis 2011   presumed/H&P  . Pneumonia 2011  . Ulcer     Medications:  Scheduled:  . heparin  5,000 Units Intravenous Once   Infusions:  . heparin      Assessment: 44 yoM c/o foot pain, with purple toes.  IV heparin for acute arterial occlusion.  Goal of Therapy:  Heparin level 0.3-0.7 units/ml Monitor platelets by anticoagulation protocol: Yes   Plan:  Baseline coags now Heparin 5000 unit bolus x1 now Start drip at 1400 units/hr Daily CBC/HL Check 1st HL in 6 hours  Dorrene German 11/11/2017,12:30 AM

## 2017-11-11 NOTE — Consult Note (Signed)
Patient name: Tony [name] MRN: [id number] DOB: 23-Sep-1965 Sex: male   REASON FOR CONSULT:    Cold right leg.  The consult is requested by Dr. Ripley Fraise  HPI:   Tony [name] is a pleasant 52 y.o. male, who presented to the [facility] emergency department complaining of right foot pain.  He was noted there to have a cold right foot and the physician at [facility] long felt that the patient had an ischemic right foot.  I asked that the patient be transferred to [facility] to the operating room holding area given that he would likely require surgery.  On my history, the patient noted the gradual onset of burning pain in the toes of his right foot 3 days ago.  This pain has been persistent and for this reason the patient presented to the emergency department.  In addition his wife had felt that the foot was cool.  Prior to these events I do not get any clear-cut history of claudication or rest pain.  He denies any history of arrhythmias or recent cardiac events.  He denies any previous history of myocardial infarction or history of congestive heart failure.  The patient's risk factors for peripheral vascular disease include hypertension, hypercholesterolemia, a family history of premature cardiovascular disease, and tobacco use.  He denies any history of diabetes.  Past Medical History:  Diagnosis Date  . Gastric ulcer due to Helicobacter pylori 8502  . GERD (gastroesophageal reflux disease)   . Hypertension   . IBS (irritable bowel syndrome)   . Mesenteric adenitis 2011   presumed/H&P  . Pneumonia 2011  . Ulcer     Family History  Problem Relation Age of Onset  . Hypertension Mother   . Hypertension Father   . Coronary artery disease Father 37  . Heart attack Brother   . Coronary artery disease Brother   . Diabetes Neg Hx   . Stroke Neg Hx     SOCIAL HISTORY: Social History   Socioeconomic History  . Marital status: Married    Spouse name: Not on file  . Number  of children: 3  . Years of education: Not on file  . Highest education level: Not on file  Occupational History  . Occupation: Admin. Assistant    Employer: [employer]  . Financial resource strain: Not on file  . Food insecurity:    Worry: Not on file    Inability: Not on file  . Transportation needs:    Medical: Not on file    Non-medical: Not on file  Tobacco Use  . Smoking status: Current Every Day Smoker    Packs/day: 1.00    Years: 10.00    Pack years: 10.00    Types: Cigarettes  . Smokeless tobacco: Never Used  Substance and Sexual Activity  . Alcohol use: Yes    Comment: Wine. Once every 1-2 months.   . Drug use: No    Types: Marijuana    Comment: 08/14/11 "last drug use ~ 30 yr ago"  . Sexual activity: Not on file  Lifestyle  . Physical activity:    Days per week: Not on file    Minutes per session: Not on file  . Stress: Not on file  Relationships  . Social connections:    Talks on phone: Not on file    Gets together: Not on file    Attends religious service: Not on file    Active member of club or  organization: Not on file    Attends meetings of clubs or organizations: Not on file    Relationship status: Not on file  . Intimate partner violence:    Fear of current or ex partner: Not on file    Emotionally abused: Not on file    Physically abused: Not on file    Forced sexual activity: Not on file  Other Topics Concern  . Not on file  Social History Narrative   Lives with wife and two children.  Administrator at [employer] Reactions  . Morphine And Related Other (See Comments)    hallucinations    Current Facility-Administered Medications  Medication Dose Route Frequency Provider Last Rate Last Dose  . heparin ADULT infusion 100 units/mL (25000 units/240mL sodium chloride 0.45%)  1,400 Units/hr Intravenous Continuous Dorrene German, RPH 14 mL/hr at 11/11/17 0042 1,400 Units/hr at 11/11/17 6387   Current Outpatient  Medications  Medication Sig Dispense Refill  . atorvastatin (LIPITOR) 10 MG tablet Take 10 mg by mouth daily.    . cephALEXin (KEFLEX) 500 MG capsule Take 1 capsule (500 mg total) by mouth 4 (four) times daily. (Patient not taking: Reported on 05/14/2016) 28 capsule 0  . diazepam (VALIUM) 5 MG tablet Take 1 tablet (5 mg total) by mouth 2 (two) times daily. (Patient not taking: Reported on 05/14/2016) 10 tablet 0  . hydrochlorothiazide (HYDRODIURIL) 25 MG tablet Take 25 mg by mouth daily.    Marland Kitchen HYDROcodone-acetaminophen (NORCO/VICODIN) 5-325 MG tablet Take 1-2 tablets by mouth every 6 (six) hours as needed. (Patient not taking: Reported on 05/14/2016) 10 tablet 0  . LORazepam (ATIVAN) 1 MG tablet 1 daily at bedtime when necessary sleep 1 every 8 hours when necessary anxiety/panic attack 10 tablet 0  . methocarbamol (ROBAXIN) 500 MG tablet Take 500 mg by mouth every 6 (six) hours as needed for muscle spasms.    . metoprolol succinate (TOPROL-XL) 100 MG 24 hr tablet Take 100 mg by mouth at bedtime. Take with or immediately following a meal.    . ondansetron (ZOFRAN) 4 MG tablet Take 1 tablet every 6 hours as needed for nausea. (Patient not taking: Reported on 05/14/2016) 50 tablet 1  . oxyCODONE-acetaminophen (PERCOCET/ROXICET) 5-325 MG tablet Take 1 tablet by mouth every 4 (four) hours as needed. (Patient not taking: Reported on 05/14/2016) 15 tablet 0  . pantoprazole (PROTONIX) 40 MG tablet Take 1 tablet (40 mg total) by mouth 2 (two) times daily. 90 tablet 3  . sucralfate (CARAFATE) 1 GM/10ML suspension Take 10 mLs (1 g total) by mouth 4 (four) times daily -  with meals and at bedtime. (Patient not taking: Reported on 05/14/2016) 420 mL 1    REVIEW OF SYSTEMS:  [X]  denotes positive finding, [ ]  denotes negative finding Cardiac  Comments:  Chest pain or chest pressure:    Shortness of breath upon exertion:    Short of breath when lying flat:    Irregular heart rhythm:        Vascular    Pain in  calf, thigh, or hip brought on by ambulation:    Pain in feet at night that wakes you up from your sleep:     Blood clot in your veins:    Leg swelling:         Pulmonary    Oxygen at home:    Productive cough:     Wheezing:         Neurologic  Sudden weakness in arms or legs:     Sudden numbness in arms or legs:     Sudden onset of difficulty speaking or slurred speech:    Temporary loss of vision in one eye:     Problems with dizziness:         Gastrointestinal    Blood in stool:     Vomited blood:         Genitourinary    Burning when urinating:     Blood in urine:        Psychiatric    Major depression:         Hematologic    Bleeding problems:    Problems with blood clotting too easily:        Skin    Rashes or ulcers:        Constitutional    Fever or chills:     PHYSICAL EXAM:   Vitals:   11/10/17 2332 11/10/17 2338 11/11/17 0018 11/11/17 0034  BP: (!) 155/79  (!) 163/83 (!) 157/89  Pulse: 84  77 71  Resp: 18  (!) 24 18  Temp: 99.2 F (37.3 C)   99.2 F (37.3 C)  TempSrc: Oral   Oral  SpO2: 99%  99% 94%  Weight:  185 lb (83.9 kg)    Height:  6\' 1"  (1.854 m)      GENERAL: The patient is a well-nourished male, in no acute distress. The vital signs are documented above. CARDIAC: There is a regular rate and rhythm.  VASCULAR: I do not detect carotid bruits. He has a diminished but palpable right femoral pulse.  I cannot palpate a left femoral pulse. He has a monophasic right posterior tibial signal with the Doppler. He has a monophasic left posterior tibial and dorsalis pedis signal with the Doppler. On my exam the right foot is not significantly cooler than the left foot.  Both feet appear to have reasonable perfusion. PULMONARY: There is good air exchange bilaterally without wheezing or rales. ABDOMEN: Soft and non-tender with normal pitched bowel sounds.  I do not palpate an abdominal aortic aneurysm. MUSCULOSKELETAL: There are no major  deformities or cyanosis. NEUROLOGIC: No focal weakness or paresthesias are detected. SKIN: There are no ulcers or rashes noted. PSYCHIATRIC: The patient has a normal affect.  DATA:    LABS: Creatinine is 1.02.  GFR is greater than 60.  White blood cell count 10.1.  Hemoglobin 13.1.  Platelets 236,000.  INR is 1.07.  PTT is 28.  MEDICAL ISSUES:   RIGHT FOOT PAIN: The patient describes burning pain in the toes of his right foot which began gradually 3 days ago.  On my exam he does not appear to have an acutely ischemic right lower extremity.  He has a reasonable Doppler signal in the posterior tibial position on the right.  He does have evidence of multilevel arterial occlusive disease with a diminished right femoral pulse and an absent left femoral pulse.  He has monophasic Doppler signals in both feet.  I have recommended a CT angiogram which I have ordered.  He will be admitted to a MedSurg bed and I will make further recommendations pending these results.  Deitra Mayo [facility] [phone]

## 2017-11-11 NOTE — Progress Notes (Signed)
Pt arrived to floor from short stay.Pt alert and oriented x4. Complained of 8/10 right foot pain, PRN Dilaudid given. All toes on right foot cool to touch. Skin intact. Lungs clear. Pt wife and child at bedside. Oriented to room and call bell.

## 2017-11-11 NOTE — Progress Notes (Signed)
   VASCULAR SURGERY ASSESSMENT & PLAN:   This patient presents with burning pain in his toes on the right foot.  I suspect that he has ischemia to the toes secondary to multilevel arterial occlusive disease.  I have reviewed his CT angiogram which shows on the right side, which is the symptomatic side, he has moderate disease of the common and external iliac artery with occlusion of the superficial femoral artery and reconstitution of the popliteal artery with two-vessel runoff via the posterior tibial and peroneal arteries.  This appears to be chronic.  On the right side he has an occluded common and external iliac artery with reconstitution of the common femoral artery and a superficial femoral artery occlusion.  Likewise he has two-vessel runoff on the left.  He has no evidence of an acute arterial occlusion.  I think he will need aortofemoral bypass grafting but will need preoperative cardiac clearance.  Given that his occlusive disease is chronic I think it is safe to let him go home and I have started him on Neurontin and Ultram for pain.  Once his cardiac clearance is complete we can schedule him for aortofemoral bypass grafting.  I have discussed the indications for aortofemoral bypass grafting and the potential complications.  These include, bleeding, infection, including the risk of graft infection, MI, renal failure, or other unpredictable medical problems.  I explained that the risk of mortality or major complication is 5%.  He may potentially require a concomitant right thumb to above-knee popliteal artery bypass.    He can be discharged today and we will arrange his cardiac appointment as an outpatient.  Once he is cleared he can be scheduled for surgery.  SUBJECTIVE:   Complains of burning pain in the toes of his right foot.  PHYSICAL EXAM:   Vitals:   11/11/17 0018 11/11/17 0034 11/11/17 0208 11/11/17 0551  BP: (!) 163/83 (!) 157/89 (!) 164/84 (!) 141/83  Pulse: 77 71 69 64    Resp: (!) 24 18 18 18   Temp:  99.2 F (37.3 C) 98.7 F (37.1 C) 98.4 F (36.9 C)  TempSrc:  Oral Oral Oral  SpO2: 99% 94% 97% 96%  Weight:   177 lb 14.6 oz (80.7 kg)   Height:   6' (1.829 m)    He has a very reasonable posterior tibial signal with the Doppler bilaterally. Some slight discoloration to the toes of the right foot consistent with chronic ischemia.  LABS:   Lab Results  Component Value Date   WBC 9.1 11/11/2017   HGB 12.7 (L) 11/11/2017   HCT 39.1 11/11/2017   MCV 93.3 11/11/2017   PLT 229 11/11/2017   Lab Results  Component Value Date   CREATININE 1.05 11/11/2017   Lab Results  Component Value Date   INR 1.07 11/11/2017    PROBLEM LIST:    Active Problems:   PVD (peripheral vascular disease) (HCC)   CURRENT MEDS:   . enoxaparin (LOVENOX) injection  40 mg Subcutaneous Q24H  . pantoprazole  40 mg Oral Daily  . potassium chloride  20-40 mEq Oral Once  . potassium chloride  20-40 mEq Oral Once    Deitra Mayo Beeper: [phone] Office: [phone] 11/11/2017

## 2017-11-12 ENCOUNTER — Other Ambulatory Visit: Payer: Self-pay

## 2017-11-13 ENCOUNTER — Inpatient Hospital Stay (HOSPITAL_COMMUNITY)
Admission: EM | Admit: 2017-11-13 | Discharge: 2017-11-26 | DRG: 271 | Disposition: A | Discharge status: Home Health | Payer: BLUE CROSS/BLUE SHIELD | Attending: Vascular Surgery | Admitting: Vascular Surgery

## 2017-11-13 ENCOUNTER — Encounter (HOSPITAL_COMMUNITY): Payer: Self-pay | Admitting: Internal Medicine

## 2017-11-13 ENCOUNTER — Other Ambulatory Visit: Payer: Self-pay

## 2017-11-13 ENCOUNTER — Inpatient Hospital Stay (HOSPITAL_COMMUNITY): Payer: BLUE CROSS/BLUE SHIELD

## 2017-11-13 ENCOUNTER — Telehealth: Payer: Self-pay | Admitting: Vascular Surgery

## 2017-11-13 DIAGNOSIS — K589 Irritable bowel syndrome without diarrhea: Secondary | ICD-10-CM | POA: Diagnosis present

## 2017-11-13 DIAGNOSIS — Z95828 Presence of other vascular implants and grafts: Secondary | ICD-10-CM

## 2017-11-13 DIAGNOSIS — I70221 Atherosclerosis of native arteries of extremities with rest pain, right leg: Principal | ICD-10-CM | POA: Diagnosis present

## 2017-11-13 DIAGNOSIS — Z885 Allergy status to narcotic agent status: Secondary | ICD-10-CM | POA: Diagnosis not present

## 2017-11-13 DIAGNOSIS — M545 Low back pain: Secondary | ICD-10-CM | POA: Diagnosis present

## 2017-11-13 DIAGNOSIS — I998 Other disorder of circulatory system: Secondary | ICD-10-CM | POA: Diagnosis not present

## 2017-11-13 DIAGNOSIS — Z79899 Other long term (current) drug therapy: Secondary | ICD-10-CM

## 2017-11-13 DIAGNOSIS — G8929 Other chronic pain: Secondary | ICD-10-CM | POA: Diagnosis present

## 2017-11-13 DIAGNOSIS — Z09 Encounter for follow-up examination after completed treatment for conditions other than malignant neoplasm: Secondary | ICD-10-CM

## 2017-11-13 DIAGNOSIS — K219 Gastro-esophageal reflux disease without esophagitis: Secondary | ICD-10-CM | POA: Diagnosis present

## 2017-11-13 DIAGNOSIS — Z01818 Encounter for other preprocedural examination: Secondary | ICD-10-CM | POA: Diagnosis not present

## 2017-11-13 DIAGNOSIS — I739 Peripheral vascular disease, unspecified: Secondary | ICD-10-CM

## 2017-11-13 DIAGNOSIS — F17200 Nicotine dependence, unspecified, uncomplicated: Secondary | ICD-10-CM | POA: Diagnosis present

## 2017-11-13 DIAGNOSIS — I361 Nonrheumatic tricuspid (valve) insufficiency: Secondary | ICD-10-CM

## 2017-11-13 DIAGNOSIS — M79606 Pain in leg, unspecified: Secondary | ICD-10-CM | POA: Diagnosis present

## 2017-11-13 DIAGNOSIS — I10 Essential (primary) hypertension: Secondary | ICD-10-CM | POA: Diagnosis present

## 2017-11-13 DIAGNOSIS — F17210 Nicotine dependence, cigarettes, uncomplicated: Secondary | ICD-10-CM | POA: Diagnosis present

## 2017-11-13 DIAGNOSIS — F418 Other specified anxiety disorders: Secondary | ICD-10-CM | POA: Diagnosis present

## 2017-11-13 DIAGNOSIS — Z9889 Other specified postprocedural states: Secondary | ICD-10-CM | POA: Diagnosis not present

## 2017-11-13 DIAGNOSIS — Z91018 Allergy to other foods: Secondary | ICD-10-CM

## 2017-11-13 DIAGNOSIS — Z8249 Family history of ischemic heart disease and other diseases of the circulatory system: Secondary | ICD-10-CM

## 2017-11-13 DIAGNOSIS — M79604 Pain in right leg: Secondary | ICD-10-CM | POA: Diagnosis present

## 2017-11-13 DIAGNOSIS — J9811 Atelectasis: Secondary | ICD-10-CM | POA: Diagnosis not present

## 2017-11-13 DIAGNOSIS — R509 Fever, unspecified: Secondary | ICD-10-CM

## 2017-11-13 DIAGNOSIS — Z01810 Encounter for preprocedural cardiovascular examination: Secondary | ICD-10-CM

## 2017-11-13 DIAGNOSIS — E785 Hyperlipidemia, unspecified: Secondary | ICD-10-CM | POA: Diagnosis present

## 2017-11-13 DIAGNOSIS — M7989 Other specified soft tissue disorders: Secondary | ICD-10-CM | POA: Diagnosis not present

## 2017-11-13 DIAGNOSIS — I7409 Other arterial embolism and thrombosis of abdominal aorta: Secondary | ICD-10-CM | POA: Diagnosis present

## 2017-11-13 HISTORY — DX: Unspecified atherosclerosis: I70.90

## 2017-11-13 HISTORY — DX: Depression, unspecified: F32.A

## 2017-11-13 HISTORY — DX: Major depressive disorder, single episode, unspecified: F32.9

## 2017-11-13 HISTORY — DX: Anxiety disorder, unspecified: F41.9

## 2017-11-13 HISTORY — DX: Unspecified osteoarthritis, unspecified site: M19.90

## 2017-11-13 HISTORY — DX: Pure hypercholesterolemia, unspecified: E78.00

## 2017-11-13 HISTORY — DX: Other chronic pain: G89.29

## 2017-11-13 HISTORY — DX: Low back pain: M54.5

## 2017-11-13 HISTORY — DX: Low back pain, unspecified: M54.50

## 2017-11-13 HISTORY — DX: Peripheral vascular disease, unspecified: I73.9

## 2017-11-13 HISTORY — DX: Dorsalgia, unspecified: M54.9

## 2017-11-13 HISTORY — DX: Migraine, unspecified, not intractable, without status migrainosus: G43.909

## 2017-11-13 LAB — CBC WITH DIFFERENTIAL/PLATELET
ABS IMMATURE GRANULOCYTES: 0.1 10*3/uL (ref 0.0–0.1)
BASOS ABS: 0.1 10*3/uL (ref 0.0–0.1)
Basophils Relative: 1 %
Eosinophils Absolute: 0.8 10*3/uL — ABNORMAL HIGH (ref 0.0–0.7)
Eosinophils Relative: 10 %
HCT: 36.9 % — ABNORMAL LOW (ref 39.0–52.0)
Hemoglobin: 12.1 g/dL — ABNORMAL LOW (ref 13.0–17.0)
Immature Granulocytes: 1 %
LYMPHS ABS: 1.9 10*3/uL (ref 0.7–4.0)
LYMPHS PCT: 24 %
MCH: 30.6 pg (ref 26.0–34.0)
MCHC: 32.8 g/dL (ref 30.0–36.0)
MCV: 93.2 fL (ref 78.0–100.0)
MONO ABS: 0.9 10*3/uL (ref 0.1–1.0)
MONOS PCT: 11 %
NEUTROS ABS: 4.3 10*3/uL (ref 1.7–7.7)
Neutrophils Relative %: 53 %
Platelets: 210 10*3/uL (ref 150–400)
RBC: 3.96 MIL/uL — ABNORMAL LOW (ref 4.22–5.81)
RDW: 12.4 % (ref 11.5–15.5)
WBC: 8 10*3/uL (ref 4.0–10.5)

## 2017-11-13 LAB — BASIC METABOLIC PANEL
Anion gap: 10 (ref 5–15)
BUN: 12 mg/dL (ref 6–20)
CHLORIDE: 102 mmol/L (ref 101–111)
CO2: 27 mmol/L (ref 22–32)
Calcium: 9.4 mg/dL (ref 8.9–10.3)
Creatinine, Ser: 0.98 mg/dL (ref 0.61–1.24)
GFR calc Af Amer: >60 mL/min (ref >60)
GFR calc non Af Amer: >60 mL/min (ref >60)
Glucose, Bld: 103 mg/dL — ABNORMAL HIGH (ref 65–99)
POTASSIUM: 3.7 mmol/L (ref 3.5–5.1)
SODIUM: 139 mmol/L (ref 135–145)

## 2017-11-13 LAB — RAPID URINE DRUG SCREEN, HOSP PERFORMED
AMPHETAMINES: NOT DETECTED
BARBITURATES: NOT DETECTED
BENZODIAZEPINES: NOT DETECTED
COCAINE: NOT DETECTED
Opiates: POSITIVE — AB
TETRAHYDROCANNABINOL: NOT DETECTED

## 2017-11-13 LAB — HEPARIN LEVEL (UNFRACTIONATED)
Heparin Unfractionated: 0.46 IU/mL (ref 0.30–0.70)
Heparin Unfractionated: 0.38 IU/mL (ref 0.30–0.70)

## 2017-11-13 LAB — LIPID PANEL
CHOLESTEROL: 116 mg/dL (ref 0–200)
HDL: 25 mg/dL — ABNORMAL LOW (ref >40)
LDL Cholesterol: 64 mg/dL (ref 0–99)
TRIGLYCERIDES: 137 mg/dL (ref <150)
Total CHOL/HDL Ratio: 4.6 RATIO
VLDL: 27 mg/dL (ref 0–40)

## 2017-11-13 LAB — HEMOGLOBIN A1C
Hgb A1c MFr Bld: 5 % (ref 4.8–5.6)
Mean Plasma Glucose: 96.8 mg/dL

## 2017-11-13 LAB — ECHOCARDIOGRAM COMPLETE
Height: 73 in
WEIGHTICAEL: 2912 [oz_av]

## 2017-11-13 MED ORDER — NICOTINE 14 MG/24HR TD PT24
14.0000 mg | MEDICATED_PATCH | Freq: Every day | TRANSDERMAL | Status: DC
  Administered 2017-11-18: 14 mg via TRANSDERMAL
  Filled 2017-11-13 (×9): qty 1

## 2017-11-13 MED ORDER — GABAPENTIN 100 MG PO CAPS
200.0000 mg | ORAL_CAPSULE | Freq: Two times a day (BID) | ORAL | Status: DC
  Administered 2017-11-13 – 2017-11-14 (×4): 200 mg via ORAL
  Filled 2017-11-13 (×4): qty 2

## 2017-11-13 MED ORDER — ACETAMINOPHEN 650 MG RE SUPP: 650.0000 mg | Freq: Four times a day (QID) | RECTAL | Status: DC | PRN

## 2017-11-13 MED ORDER — FENTANYL CITRATE (PF) 100 MCG/2ML IJ SOLN
INTRAMUSCULAR | Status: AC
  Filled 2017-11-13: qty 2

## 2017-11-13 MED ORDER — HYDROMORPHONE HCL 2 MG/ML IJ SOLN
1.0000 mg | Freq: Once | INTRAMUSCULAR | Status: AC
  Administered 2017-11-13: 1 mg via INTRAVENOUS
  Filled 2017-11-13: qty 1

## 2017-11-13 MED ORDER — LACTATED RINGERS IV SOLN
INTRAVENOUS | Status: DC
  Administered 2017-11-13 – 2017-11-15 (×5): via INTRAVENOUS

## 2017-11-13 MED ORDER — SODIUM CHLORIDE 0.9% FLUSH
3.0000 mL | Freq: Two times a day (BID) | INTRAVENOUS | Status: DC
  Administered 2017-11-13 – 2017-11-25 (×22): 3 mL via INTRAVENOUS
  Administered 2017-11-25: 12:00:00 via INTRAVENOUS

## 2017-11-13 MED ORDER — ONDANSETRON HCL 4 MG/2ML IJ SOLN: 4.0000 mg | Freq: Three times a day (TID) | INTRAMUSCULAR | Status: DC | PRN

## 2017-11-13 MED ORDER — HEPARIN (PORCINE) IN NACL 100-0.45 UNIT/ML-% IJ SOLN
1400.0000 [IU]/h | INTRAMUSCULAR | Status: DC
  Administered 2017-11-13 – 2017-11-14 (×2): 1400 [IU]/h via INTRAVENOUS
  Filled 2017-11-13 (×2): qty 250

## 2017-11-13 MED ORDER — ONDANSETRON HCL 4 MG PO TABS: 4.0000 mg | ORAL_TABLET | Freq: Four times a day (QID) | ORAL | Status: DC | PRN

## 2017-11-13 MED ORDER — HYDROMORPHONE HCL 2 MG/ML IJ SOLN
0.5000 mg | INTRAMUSCULAR | Status: DC | PRN
  Administered 2017-11-13 – 2017-11-15 (×16): 1 mg via INTRAVENOUS
  Filled 2017-11-13 (×16): qty 1

## 2017-11-13 MED ORDER — METOPROLOL SUCCINATE ER 50 MG PO TB24
100.0000 mg | ORAL_TABLET | Freq: Every day | ORAL | Status: DC
  Administered 2017-11-13 – 2017-11-14 (×2): 100 mg via ORAL
  Filled 2017-11-13 (×2): qty 1

## 2017-11-13 MED ORDER — FENTANYL CITRATE (PF) 100 MCG/2ML IJ SOLN
50.0000 ug | INTRAMUSCULAR | Status: DC | PRN
  Administered 2017-11-13: 50 ug via INTRAVENOUS

## 2017-11-13 MED ORDER — ACETAMINOPHEN 325 MG PO TABS
650.0000 mg | ORAL_TABLET | Freq: Four times a day (QID) | ORAL | Status: DC | PRN
  Administered 2017-11-14 (×2): 650 mg via ORAL
  Filled 2017-11-13 (×2): qty 2

## 2017-11-13 MED ORDER — ONDANSETRON HCL 4 MG/2ML IJ SOLN: 4.0000 mg | Freq: Four times a day (QID) | INTRAMUSCULAR | Status: DC | PRN

## 2017-11-13 MED ORDER — ONDANSETRON HCL 4 MG/2ML IJ SOLN
4.0000 mg | Freq: Once | INTRAMUSCULAR | Status: AC
  Administered 2017-11-13: 4 mg via INTRAVENOUS
  Filled 2017-11-13: qty 2

## 2017-11-13 MED ORDER — HEPARIN SODIUM (PORCINE) 5000 UNIT/ML IJ SOLN: 5000.0000 [IU] | Freq: Three times a day (TID) | INTRAMUSCULAR | Status: DC

## 2017-11-13 MED ORDER — HEPARIN BOLUS VIA INFUSION
5000.0000 [IU] | Freq: Once | INTRAVENOUS | Status: AC
  Administered 2017-11-13: 5000 [IU] via INTRAVENOUS
  Filled 2017-11-13: qty 5000

## 2017-11-13 MED ORDER — ATORVASTATIN CALCIUM 40 MG PO TABS
40.0000 mg | ORAL_TABLET | Freq: Every day | ORAL | Status: DC
  Administered 2017-11-13 – 2017-11-25 (×9): 40 mg via ORAL
  Filled 2017-11-13 (×9): qty 1

## 2017-11-13 MED ORDER — HYDROCHLOROTHIAZIDE 25 MG PO TABS
25.0000 mg | ORAL_TABLET | Freq: Every day | ORAL | Status: DC
  Administered 2017-11-13 – 2017-11-14 (×2): 25 mg via ORAL
  Filled 2017-11-13 (×2): qty 1

## 2017-11-13 MED ORDER — HYDROMORPHONE HCL 2 MG/ML IJ SOLN: 0.5000 mg | INTRAMUSCULAR | Status: DC | PRN

## 2017-11-13 NOTE — ED Triage Notes (Signed)
Patient was discharged from here Sunday for "peripheral vascular disease", blockages in bilateral legs (arteries). He was awaiting cardiology to have appt to "check my heart" so that he could have surgery. Patient states that he is in extreme pain.

## 2017-11-13 NOTE — Progress Notes (Signed)
ANTICOAGULATION CONSULT NOTE - Initial Consult  Pharmacy Consult for heparin Indication: PVD with ischemia  Allergies  Allergen Reactions  . Morphine And Related Other (See Comments)    hallucinations    Patient Measurements: Height: 6\' 1"  (185.4 cm) Weight: 182 lb (82.6 kg) IBW/kg (Calculated) : 79.9 Heparin Dosing Weight: 82.6kg  Vital Signs: Temp: 98.1 F (36.7 C) (06/04 0042) Temp Source: Oral (06/04 0042) BP: 154/87 (06/04 0515) Pulse Rate: 75 (06/04 0841)  Labs: Recent Labs    11/11/17 0025 11/11/17 0308 11/13/17 0511  HGB 13.1 12.7* 12.1*  HCT 38.8* 39.1 36.9*  PLT 236 229 210  APTT 28  --   --   LABPROT 13.8  --   --   INR 1.07  --   --   CREATININE 1.02 1.05 0.98    Estimated Creatinine Clearance: 103 mL/min (by C-G formula based on SCr of 0.98 mg/dL).   Medical History: Past Medical History:  Diagnosis Date  . Chronic back pain   . Gastric ulcer due to Helicobacter pylori 8264  . GERD (gastroesophageal reflux disease)   . Hypertension   . IBS (irritable bowel syndrome)   . Mesenteric adenitis 2011   presumed/H&P  . Pneumonia 2011  . Ulcer     Medications:  Infusions:  . heparin 1,400 Units/hr (11/13/17 1037)  . lactated ringers 75 mL/hr at 11/13/17 1031    Assessment: 66 yom presented to the ED with worsening foot pain. Recently at [facility] with similar complaint. Restarting IV heparin. Baseline Hgb is slightly low and platelets are WNL. He is not on anticoagulation PTA.   Goal of Therapy:  Heparin level 0.3-0.7 units/ml Monitor platelets by anticoagulation protocol: Yes   Plan:  Heparin bolus 5000 units IV x 1 Heparin gtt 1400 units/hr Check a 6 hr heparin level Daily heparin level and CBC  Gianfranco Araki, Rande Lawman 11/13/2017,9:49 AM

## 2017-11-13 NOTE — Progress Notes (Signed)
[place] for heparin Indication: PVD with ischemia  Allergies  Allergen Reactions  . Morphine And Related Other (See Comments)    hallucinations  . Pork-Derived Products     Pt doesn't eat pork    Patient Measurements: Height: 6\' 1"  (185.4 cm) Weight: 182 lb (82.6 kg) IBW/kg (Calculated) : 79.9 Heparin Dosing Weight: 82.6kg  Vital Signs: Temp: 99.7 F (37.6 C) (06/04 1454) Temp Source: Oral (06/04 1454) BP: 146/102 (06/04 1454) Pulse Rate: 96 (06/04 1454)  Labs: Recent Labs    11/11/17 0025 11/11/17 0308 11/13/17 0511 11/13/17 1611  HGB 13.1 12.7* 12.1*  --   HCT 38.8* 39.1 36.9*  --   PLT 236 229 210  --   APTT 28  --   --   --   LABPROT 13.8  --   --   --   INR 1.07  --   --   --   HEPARINUNFRC  --   --   --  0.46  CREATININE 1.02 1.05 0.98  --     Estimated Creatinine Clearance: 103 mL/min (by C-G formula based on SCr of 0.98 mg/dL).   Medical History: Past Medical History:  Diagnosis Date  . Anxiety   . Arterial occlusive disease    multilevel arterial occlusive disease/notes 11/13/2017  . Arthritis    "left knee" (11/13/2017)  . Chronic back pain   . Chronic lower back pain   . Depression   . Gastric ulcer due to Helicobacter pylori 2542  . GERD (gastroesophageal reflux disease)   . High cholesterol   . Hypertension   . IBS (irritable bowel syndrome)   . Mesenteric adenitis 2011   presumed/H&P  . Migraines    "use to suffer migraines years ago" (11/13/2017)  . Peripheral vascular disease ([place])   . Pneumonia 2011  . Ulcer     Medications:  Infusions:  . heparin 1,400 Units/hr (11/13/17 1037)  . lactated ringers 75 mL/hr at 11/13/17 1031    Assessment: 85 yom presented to the ED with worsening foot pain. Recently at [facility] with similar complaint. Restarting IV heparin. Baseline Hgb is slightly low and platelets are WNL. He is not on anticoagulation PTA.   Initial heparin level drawn a little  before steady state, but within goal range.  No overt bleeding or complications noted.  Goal of Therapy:  Heparin level 0.3-0.7 units/ml Monitor platelets by anticoagulation protocol: Yes   Plan:  Continue IV heparin gtt at 1400 units/hr Confirm heparin level in 6 hrs. Daily heparin level and CBC  [place] Crane, Roylene Reason, [facility] Clinical Pharmacist Pager [phone]  11/13/2017 5:46 PM

## 2017-11-13 NOTE — ED Notes (Signed)
Pt aware of need for urine  

## 2017-11-13 NOTE — H&P (Signed)
History and Physical    Tony [name] Tony [name] DOB: 23-Mar-1966 DOA: 11/13/2017  PCP: [name], MD Consultants:  Scot Dock, vascular Patient coming from:  Home - lives with wife and 2 children; NOK: wife, [phone]  Chief Complaint: Foot pain  HPI: Tony [name] is a 52 y.o. male with medical history significant of HTN, IBS, and GERD presenting with LE pain.  He came to the ER Saturday due to foot pain and discoloration.  He was transferred from [facility] to [facility] for consideration of emergency surgery.  He had the CT with runoff and was found to have bilateral lower limb ischemia.  He was discharged with plan for preop clearance prior to surgery - he will need aortofemoral bypass grating and possible also concomitant right femoral to above-knee popliteal artery bypass.  They called vascular surgery due to ongoing pain and he was sent back to the ER.  His right foot is worse with regards to pain, although imaging indicated the left is worse with the blockage.  They continue to be discolored and they "are freezing cold".  He has intermittent chest pain with lying down, but no exertional chest pain.  No SOB.  Remote stress test "years and years ago".  He worked at [employer] for 20 years but he had a knee and back injury on the job about 6 months ago.  He was sent out on [employer] and he developed anxiety and depression about this.   ED Course:  PVD with known LE occlusion.  Worsening pain.  Dr. Oneida Alar recommends hospital admission and preop clearance.  No DP signal, but he has faint PT signals - Dr. Oneida Alar is aware.  Review of Systems: Not performed, patient very somnolent  Ambulatory Status:  Ambulated without assistance prior to onset of current symptoms  Past Medical History:  Diagnosis Date  . Chronic back pain   . Gastric ulcer due to Helicobacter pylori 5643  . GERD (gastroesophageal reflux disease)   . Hypertension   . IBS (irritable bowel syndrome)   . Mesenteric adenitis  2011   presumed/H&P  . Pneumonia 2011  . Ulcer     Past Surgical History:  Procedure Laterality Date  . BACK SURGERY     "procedure where they burn the nerves every 6 months"  . ESOPHAGOGASTRODUODENOSCOPY  08/22/2011   Procedure: ESOPHAGOGASTRODUODENOSCOPY (EGD);  Surgeon: [name], MD;  Location: [facility];  Service: Gastroenterology;  Laterality: N/A;  . KNEE ARTHROSCOPY     left    Social History   Socioeconomic History  . Marital status: Married    Spouse name: Not on file  . Number of children: 3  . Years of education: Not on file  . Highest education level: Not on file  Occupational History  . Occupation: Admin. Assistant    Employer: [employer]  . Financial resource strain: Not on file  . Food insecurity:    Worry: Not on file    Inability: Not on file  . Transportation needs:    Medical: Not on file    Non-medical: Not on file  Tobacco Use  . Smoking status: Current Every Day Smoker    Packs/day: 1.00    Years: 32.00    Pack years: 32.00    Types: Cigarettes  . Smokeless tobacco: Never Used  . Tobacco comment: "quit yesterday"  Substance and Sexual Activity  . Alcohol use: Yes    Comment: Wine. Once every 1-2 months.   . Drug use:  No    Types: Marijuana    Comment: 08/14/11 "last drug use ~ 30 yr ago"  . Sexual activity: Not on file  Lifestyle  . Physical activity:    Days per week: Not on file    Minutes per session: Not on file  . Stress: Not on file  Relationships  . Social connections:    Talks on phone: Not on file    Gets together: Not on file    Attends religious service: Not on file    Active member of club or organization: Not on file    Attends meetings of clubs or organizations: Not on file    Relationship status: Not on file  . Intimate partner violence:    Fear of current or ex partner: Not on file    Emotionally abused: Not on file    Physically abused: Not on file    Forced sexual activity: Not on file  Other  Topics Concern  . Not on file  Social History Narrative   Lives with wife and two children.  Administrator at [employer]  . Morphine And Related Other (See Comments)    hallucinations    Family History  Problem Relation Age of Onset  . Hypertension Mother   . Hypertension Father   . Coronary artery disease Father 15  . Heart attack Brother 42  . Coronary artery disease Brother   . Diabetes Neg Hx   . Stroke Neg Hx     Prior to Admission medications   Medication Sig Start Date End Date Taking? Authorizing Provider  atorvastatin (LIPITOR) 10 MG tablet Take 10 mg by mouth daily.   Yes [provider]  gabapentin (NEURONTIN) 100 MG capsule Take 2 capsules (200 mg total) by mouth 2 (two) times daily. 11/11/17  Yes Ulyses Amor, PA-C  hydrochlorothiazide (HYDRODIURIL) 25 MG tablet Take 25 mg by mouth daily.   Yes [provider]  metoprolol succinate (TOPROL-XL) 100 MG 24 hr tablet Take 100 mg by mouth at bedtime. Take with or immediately following a meal.   Yes [provider]  traMADol (ULTRAM) 50 MG tablet Take 1 tablet (50 mg total) by mouth every 6 (six) hours. 11/11/17  Yes Ulyses Amor, PA-C  cephALEXin (KEFLEX) 500 MG capsule Take 1 capsule (500 mg total) by mouth 4 (four) times daily. Patient not taking: Reported on 11/13/2017 11/07/15   [name], PA-C  diazepam (VALIUM) 5 MG tablet Take 1 tablet (5 mg total) by mouth 2 (two) times daily. Patient not taking: Reported on 11/13/2017 01/19/16   [name], PA-C  HYDROcodone-acetaminophen (NORCO/VICODIN) 5-325 MG tablet Take 1-2 tablets by mouth every 6 (six) hours as needed. Patient not taking: Reported on 11/13/2017 11/07/15   [name], PA-C  LORazepam (ATIVAN) 1 MG tablet 1 daily at bedtime when necessary sleep 1 every 8 hours when necessary anxiety/panic attack Patient not taking: Reported on 11/13/2017 12/27/16   Tanna Furry, MD  ondansetron (ZOFRAN) 4  MG tablet Take 1 tablet every 6 hours as needed for nausea. Patient not taking: Reported on 11/13/2017 08/16/15   Esterwood, Amy S, PA-C  oxyCODONE-acetaminophen (PERCOCET/ROXICET) 5-325 MG tablet Take 1 tablet by mouth every 4 (four) hours as needed. Patient not taking: Reported on 11/13/2017 01/19/16   [name], PA-C  pantoprazole (PROTONIX) 40 MG tablet Take 1 tablet (40 mg total) by mouth 2 (two) times daily. Patient not taking: Reported on 11/13/2017 08/16/15  Esterwood, Amy S, PA-C  sucralfate (CARAFATE) 1 GM/10ML suspension Take 10 mLs (1 g total) by mouth 4 (four) times daily -  with meals and at bedtime. Patient not taking: Reported on 11/13/2017 08/16/15   Alfredia Ferguson, PA-C    Physical Exam: Vitals:   11/13/17 0252 11/13/17 0500 11/13/17 0515 11/13/17 0841  BP: 128/70 (!) 146/82 (!) 154/87   Pulse: 67 75 64 75  Resp: 16 18 18    Temp:      TempSrc:      SpO2: 98% 98% 100% 98%  Weight:      Height:         General:  Appears calm and comfortable and is NAD; he was sleeping soundly throughout my evaluation, and his wife reported that he had been unable to sleep for the last 4 days Eyes: normal lids ENT: normal lips & tongue, +snoring Neck:  no LAD, masses or thyromegaly; no carotid bruits Cardiovascular:  RRR, no m/r/g. No LE edema.  Respiratory:   CTA bilaterally with no wheezes/rales/rhonchi.  Normal respiratory effort. Abdomen:  soft, NT, ND, NABS Skin: mottling of the R > L foot    Musculoskeletal:  grossly normal tone BUE/BLE,, no bony abnormality Lower extremity:  No LE edema.  Limited foot exam with no ulcerations.  Feet are cool with reported dopplerable posterior tibialis only pulses Psychiatric: somnolent from sedation and recent sleeplessness Neurologic: unable to assess    Radiological Exams on Admission: Dg Chest 2 View  Result Date: 11/13/2017 CLINICAL DATA:  Preoperative evaluation for vascular surgery EXAM: CHEST - 2 VIEW COMPARISON:  December 26, 2016 FINDINGS: There is minimal atelectatic change in the left base. Lungs elsewhere are clear. Heart size and pulmonary vascularity are normal. No adenopathy. No bone lesions. IMPRESSION: Minimal atelectatic change left base.  No edema or consolidation. Electronically Signed   By: Lowella Grip III M.D.   On: 11/13/2017 08:14    EKG: Independently reviewed.  NSR with rate 64; nonspecific ST changes with no evidence of acute ischemia   Labs on Admission: I have personally reviewed the available labs and imaging studies at the time of the admission.  Pertinent labs:   Stable CBC, BMP   Assessment/Plan Principal Problem:   Pain of right lower extremity due to ischemia Active Problems:   SMOKER   Depression with anxiety   Essential hypertension   Hyperlipidemia LDL goal <70   Critical lower limb ischemia -Dr. Scot Dock is consulting on the patient and is planning to see him now -The plan will either be for a staged procedure Thursday or more urgent surgery sooner -He does not recommend anticoagulation with heparin infusion at this time since this appears to be a chronic occlusion -Pain control with Dilaudid -NPO for now, pending echo and Dr. Nicole Cella assessment -He will need aggressive risk factor modification post-operatively (see below) so will check FLP, A1c (mild hyperglycemia) now  Pre-operative clearance -Peripheral vascular surgery is associated with a high (>5%) cardiovascular risk for cardiac death and nonfatal MI -His revised cardiac index gives a risk estimate of only 0.9% due to no concomitant risk factors -Because of this risk, he is recommended to have pre-operative EKG testing prior to surgery -His Detsky's Modified Cardiac Risk Index score is Class I, with a low cardiac risk -Given his moderate functional capacity, it is reasonable for him to go to the OR without additional evaluation -However, I did also discuss the patient with cardiology and the recommendation  was for  Echo (will order stat in case of need for urgent surgery); if normal, no further testing is indicated but if abnormal EF will need cardiology evaluation prior to surgery  HTN -Continue HCTZ and Toprol XL for now -Monitor BP  HLD -Increase Lipitor to 40 mg daily -Check FLP  Depression -Patient had a period of depression with anxiety following his work injury -He is no longer taking medications -He will need to be monitored for recurrence of symptoms in this setting  Tobacco dependence -Encourage cessation.  This was discussed with the patient and should be reviewed on an ongoing basis.  This will be essential going forward.  -Patch ordered.   DVT prophylaxis: Heparin [place] for now Code Status:  Full - confirmed with family Family Communication: Wife present throughout and provided the entire history  Disposition Plan:  Home once clinically improved Consults called: Vascular surgery  Admission status: Admit - It is my clinical opinion that admission to INPATIENT is reasonable and necessary because of the expectation that this patient will require hospital care that crosses at least 2 midnights to treat this condition based on the medical complexity of the problems presented.  Given the aforementioned information, the predictability of an adverse outcome is felt to be significant.    Karmen Bongo MD [facility]  If note is complete, please contact covering daytime or nighttime physician. [email or web address] Password [email or web address]  11/13/2017, 9:11 AM

## 2017-11-13 NOTE — Progress Notes (Signed)
Patient name: Tony [name] MRN: [id number] DOB: 07-24-1965 Sex: male  REASON FOR CONSULT:   Severe burning pain right toes with multilevel arterial occlusive disease.  HPI:   Tony [name] is a pleasant 52 y.o. male who I saw in consultation in the emergency department on 11/11/2017 with severe burning pain in the toes of his right foot which began gradually 3 days ago.  He underwent a CT angiogram at that time which showed multilevel arterial occlusive disease which was chronic.  I set him up for outpatient cardiac evaluation prior to aortofemoral bypass grafting and a right femoropopliteal bypass.  However he return to the emergency department last night complaining that the pain was worse.  He is being admitted by the medical service for cardiac work-up and control of his blood pressure in anticipation of surgery.  Of left calf thigh and hip claudication which is brought on by ambulation the patient has noted a long history and relieved with rest.  He denies any claudication symptoms in the right leg prior to his most recent symptoms.  He denies rest pain on the left.  His chief complaint is severe burning pain in the toes of the right foot.  The patient does have a history of back pain but states that his back pain is actually better than it is ever been and that his symptoms had always involve the left leg.  His risk factors for peripheral vascular disease include hypertension, hypercholesterolemia, and a family history of premature cardiovascular disease and tobacco use.  He denies diabetes.  Current Facility-Administered Medications  Medication Dose Route Frequency Provider Last Rate Last Dose  . HYDROmorphone (DILAUDID) injection 0.5-1 mg  0.5-1 mg Intravenous Q2H PRN Karmen Bongo, MD   1 mg at 11/13/17 0902  . nicotine (NICODERM CQ - dosed in mg/24 hours) patch 14 mg  14 mg Transdermal Daily Karmen Bongo, MD       Current Outpatient Medications  Medication Sig Dispense  Refill  . atorvastatin (LIPITOR) 10 MG tablet Take 10 mg by mouth daily.    Marland Kitchen gabapentin (NEURONTIN) 100 MG capsule Take 2 capsules (200 mg total) by mouth 2 (two) times daily. 60 capsule 2  . hydrochlorothiazide (HYDRODIURIL) 25 MG tablet Take 25 mg by mouth daily.    . metoprolol succinate (TOPROL-XL) 100 MG 24 hr tablet Take 100 mg by mouth at bedtime. Take with or immediately following a meal.    . traMADol (ULTRAM) 50 MG tablet Take 1 tablet (50 mg total) by mouth every 6 (six) hours. 30 tablet 0    REVIEW OF SYSTEMS:  [X]  denotes positive finding, [ ]  denotes negative finding Cardiac  Comments:  Chest pain or chest pressure:    Shortness of breath upon exertion:    Short of breath when lying flat:    Irregular heart rhythm:    Constitutional    Fever or chills:     PHYSICAL EXAM:   Vitals:   11/13/17 0252 11/13/17 0500 11/13/17 0515 11/13/17 0841  BP: 128/70 (!) 146/82 (!) 154/87   Pulse: 67 75 64 75  Resp: 16 18 18    Temp:      TempSrc:      SpO2: 98% 98% 100% 98%  Weight:      Height:        GENERAL: The patient is a well-nourished male, in no acute distress. The vital signs are documented above. CARDIOVASCULAR: There is a regular rate and rhythm. PULMONARY: There is good  air exchange bilaterally without wheezing or rales. VASCULAR: I do not detect carotid bruits. On the right side he has a markedly diminished femoral pulse.  He has a monophasic posterior tibial signal with the Doppler.  I cannot obtain a dorsalis pedis signal or peroneal signal. On the left side, I cannot palpate a femoral, popliteal, or pedal pulses.  He has a monophasic posterior tibial signal with the Doppler. He does have some slight discoloration to the toes of the right foot but they are not clearly mottled.  DATA:   CT ANGIOGRAM: His CT angiogram shows some mild diffuse disease of his infrarenal aorta.  On the right side he has diffuse disease of his common iliac artery and external iliac  artery with occlusion of the superficial femoral artery and reconstitution of the above-knee popliteal artery with two-vessel runoff via the peroneal and posterior tibial arteries.  On the left side, the common iliac and external iliac arteries are occluded with reconstitution of the common femoral artery.  The left superficial femoral artery is occluded and there is reconstitution of the popliteal artery with two-vessel runoff on the left via the posterior tibial and peroneal arteries.  MEDICAL ISSUES:   MULTILEVEL ARTERIAL OCCLUSIVE DISEASE: This patient has multilevel arterial occlusive disease and certainly I think some of his pain in the right foot could be from ischemia.  However the burning pain in his toes is somewhat unusual and I have explained that even with successful revascularization he may continue to have some burning pain in the toes.  If this did not improve with revascularization he would need further neurologic work-up.  The findings on his CT scan are chronic and that he has significant collaterals.  However given the severity of his ischemia I have recommended that we proceed with revascularization.  I do not think he is a good candidate for an endovascular approach on the right given his age and diffuse disease.  He is clearly not a candidate for endovascular approach on the left.  I would recommend aortofemoral bypass grafting and a right femoral to above-knee popliteal artery bypass given the severity of his ischemia.  He will undergo vein mapping on the right today.  I have discussed the indications of the procedure that is to potentially relieve his symptoms of claudication on the left and to help relieve some of his pain in the right foot.  I have also discussed the potential complications of surgery.  The potential risks include, but are not limited to, bleeding, wound healing problems, infection including the risk of graft infection, MI, renal failure, limb loss, or other  unpredictable medical problems.  I have explained that the risk of mortality or major morbidity is 5%.  I have spoken with the medical service and they have spoken with cardiology.  The plan is to get an echo today and if this is unremarkable then he will not need further cardiac work-up.  I have rearrange my schedule and put him on for surgery on Thursday.  All of his questions were answered and he is agreeable to proceed.  Deitra Mayo [facility] [phone]

## 2017-11-13 NOTE — ED Provider Notes (Signed)
[facility] EMERGENCY DEPARTMENT Provider Note   CSN: [id number] Arrival date & time: 11/13/17  0038     History   Chief Complaint Chief Complaint  Patient presents with  . Foot Pain    recent dx of blockages in lower extremity arteries    HPI Tony [name] is a 52 y.o. male.  Patient presents to the emergency department with a chief complaint of bilateral lower extremity pain.  He was admitted over the weekend for occlusions in his lower extremities.  It was determined that he would likely need to have bypass surgery, but he would need cardiac clearance prior to surgery.  He was seen by Dr. Scot Dock.  Patient was ultimately discharged to obtain cardiac clearance.  Patient was advised to return to the emergency department for worsening symptoms.  Patient states that his symptoms worsened acutely last night.  His pain is uncontrolled.  He has been taking his medication as prescribed.  He rates his pain as severe.  The history is provided by the patient. No language interpreter was used.    Past Medical History:  Diagnosis Date  . Gastric ulcer due to Helicobacter pylori 1660  . GERD (gastroesophageal reflux disease)   . Hypertension   . IBS (irritable bowel syndrome)   . Mesenteric adenitis 2011   presumed/H&P  . Pneumonia 2011  . Ulcer     Patient Active Problem List   Diagnosis Date Noted  . PVD (peripheral vascular disease) ([place]) 11/11/2017  . H. pylori infection 10/11/2011  . Duodenal ulcer 08/23/2011  . IBS (irritable bowel syndrome) 08/18/2011  . DEPRESSION 05/11/2009  . EPIGASTRIC PAIN, CHRONIC 05/11/2009  . SMOKER 09/01/2008  . INSOMNIA, CHRONIC 09/01/2008  . ESSENTIAL HYPERTENSION 09/01/2008  . PALPITATIONS, CHRONIC 09/01/2008  . GASTRITIS 10/24/2007    Past Surgical History:  Procedure Laterality Date  . ESOPHAGOGASTRODUODENOSCOPY  08/22/2011   Procedure: ESOPHAGOGASTRODUODENOSCOPY (EGD);  Surgeon: [name], MD;  Location: [facility];  Service: Gastroenterology;  Laterality: N/A;  . KNEE ARTHROSCOPY     left        Home Medications    Prior to Admission medications   Medication Sig Start Date End Date Taking? Authorizing Provider  atorvastatin (LIPITOR) 10 MG tablet Take 10 mg by mouth daily.   Yes [provider]  gabapentin (NEURONTIN) 100 MG capsule Take 2 capsules (200 mg total) by mouth 2 (two) times daily. 11/11/17  Yes Ulyses Amor, PA-C  hydrochlorothiazide (HYDRODIURIL) 25 MG tablet Take 25 mg by mouth daily.   Yes [provider]  metoprolol succinate (TOPROL-XL) 100 MG 24 hr tablet Take 100 mg by mouth at bedtime. Take with or immediately following a meal.   Yes [provider]  traMADol (ULTRAM) 50 MG tablet Take 1 tablet (50 mg total) by mouth every 6 (six) hours. 11/11/17  Yes Ulyses Amor, PA-C  cephALEXin (KEFLEX) 500 MG capsule Take 1 capsule (500 mg total) by mouth 4 (four) times daily. Patient not taking: Reported on 11/13/2017 11/07/15   [name], PA-C  diazepam (VALIUM) 5 MG tablet Take 1 tablet (5 mg total) by mouth 2 (two) times daily. Patient not taking: Reported on 11/13/2017 01/19/16   [name], PA-C  HYDROcodone-acetaminophen (NORCO/VICODIN) 5-325 MG tablet Take 1-2 tablets by mouth every 6 (six) hours as needed. Patient not taking: Reported on 11/13/2017 11/07/15   [name], PA-C  LORazepam (ATIVAN) 1 MG tablet 1 daily at bedtime when necessary sleep 1  every 8 hours when necessary anxiety/panic attack Patient not taking: Reported on 11/13/2017 12/27/16   Tanna Furry, MD  ondansetron (ZOFRAN) 4 MG tablet Take 1 tablet every 6 hours as needed for nausea. Patient not taking: Reported on 11/13/2017 08/16/15   Esterwood, Amy S, PA-C  oxyCODONE-acetaminophen (PERCOCET/ROXICET) 5-325 MG tablet Take 1 tablet by mouth every 4 (four) hours as needed. Patient not taking: Reported on 11/13/2017 01/19/16   [name], PA-C  pantoprazole  (PROTONIX) 40 MG tablet Take 1 tablet (40 mg total) by mouth 2 (two) times daily. Patient not taking: Reported on 11/13/2017 08/16/15   Esterwood, Amy S, PA-C  sucralfate (CARAFATE) 1 GM/10ML suspension Take 10 mLs (1 g total) by mouth 4 (four) times daily -  with meals and at bedtime. Patient not taking: Reported on 11/13/2017 08/16/15   Alfredia Ferguson, PA-C    Family History Family History  Problem Relation Age of Onset  . Hypertension Mother   . Hypertension Father   . Coronary artery disease Father 5  . Heart attack Brother   . Coronary artery disease Brother   . Diabetes Neg Hx   . Stroke Neg Hx     Social History Social History   Tobacco Use  . Smoking status: Current Every Day Smoker    Packs/day: 1.00    Years: 10.00    Pack years: 10.00    Types: Cigarettes  . Smokeless tobacco: Never Used  Substance Use Topics  . Alcohol use: Yes    Comment: Wine. Once every 1-2 months.   . Drug use: No    Types: Marijuana    Comment: 08/14/11 "last drug use ~ 30 yr ago"     Allergies   Morphine and related   Review of Systems Review of Systems  All other systems reviewed and are negative.    Physical Exam Updated Vital Signs BP (!) 146/82   Pulse 75   Temp 98.1 F (36.7 C) (Oral)   Resp 18   Ht 6\' 1"  (1.854 m)   Wt 82.6 kg (182 lb)   SpO2 98%   BMI 24.01 kg/m   Physical Exam  Constitutional: He is oriented to person, place, and time. He appears well-developed and well-nourished.  HENT:  Head: Normocephalic and atraumatic.  Eyes: Pupils are equal, round, and reactive to light. Conjunctivae and EOM are normal. Right eye exhibits no discharge. Left eye exhibits no discharge. No scleral icterus.  Neck: Normal range of motion. Neck supple. No JVD present.  Cardiovascular: Normal rate, regular rhythm and normal heart sounds. Exam reveals no gallop and no friction rub.  No murmur heard. Unable to obtain Doppler signal on the dorsalis pedis pulses, but he does have a  faint bilateral posterior tibialis signal.  Pulmonary/Chest: Effort normal and breath sounds normal. No respiratory distress. He has no wheezes. He has no rales. He exhibits no tenderness.  Abdominal: Soft. He exhibits no distension and no mass. There is no tenderness. There is no rebound and no guarding.  Musculoskeletal: Normal range of motion. He exhibits no edema or tenderness.  Neurological: He is alert and oriented to person, place, and time.  Sensation intact  Skin: Skin is warm and dry.  Feet are cool to touch bilaterally  Psychiatric: He has a normal mood and affect. His behavior is normal. Judgment and thought content normal.  Nursing note and vitals reviewed.    ED Treatments / Results  Labs (all labs ordered are listed, but only abnormal  results are displayed) Labs Reviewed  CBC WITH DIFFERENTIAL/PLATELET - Abnormal; Notable for the following components:      Result Value   RBC 3.96 (*)    Hemoglobin 12.1 (*)    HCT 36.9 (*)    Eosinophils Absolute 0.8 (*)    All other components within normal limits  BASIC METABOLIC PANEL - Abnormal; Notable for the following components:   Glucose, Bld 103 (*)    All other components within normal limits    EKG None  Radiology No results found.  Procedures Procedures (including critical care time)  Medications Ordered in ED Medications  fentaNYL (SUBLIMAZE) injection 50 mcg (0 mcg Intravenous Hold 11/13/17 0506)  HYDROmorphone (DILAUDID) injection 1 mg (has no administration in time range)  HYDROmorphone (DILAUDID) injection 1 mg (1 mg Intravenous Given 11/13/17 0525)  ondansetron (ZOFRAN) injection 4 mg (4 mg Intravenous Given 11/13/17 0525)     Initial Impression / Assessment and Plan / ED Course  I have reviewed the triage vital signs and the nursing notes.  Pertinent labs & imaging results that were available during my care of the patient were reviewed by me and considered in my medical decision making (see chart for  details).     Patient with bilateral lower extremity pain.  He has occlusions of several of his lower extremity arteries.  See recent CT with runoff for greater details.  He returns with worsening pain today.  I discussed the case with Dr. Oneida Alar from vascular surgery, who recommends admission to hospital and preop clearance.  He will discuss case with Dr. Scot Dock.  Dr. Oneida Alar aware of no dorsalis pedis signal, but is reassured by posterior tibialis signals, even if faint.  Current plan is for admission, pre-op clearance and going to surgery asap.  Final Clinical Impressions(s) / ED Diagnoses   Final diagnoses:  Lower limb ischemia    ED Discharge Orders    None       [name], PA-C 11/13/17 2725    Merryl Hacker, MD 11/14/17 [phone]

## 2017-11-13 NOTE — Telephone Encounter (Signed)
Got order to sch ov and cadiac clearance from triage, put in referral for cardiologist, pt was admitted to hospital, canceled ov and cadiac referral as per triage

## 2017-11-13 NOTE — Progress Notes (Signed)
  Echocardiogram 2D Echocardiogram has been performed.  Merrie Roof F 11/13/2017, 10:08 AM

## 2017-11-13 NOTE — Progress Notes (Signed)
[facility] for Heparin Indication: PVD with ischemia  Allergies  Allergen Reactions  . Morphine And Related Other (See Comments)    hallucinations  . Pork-Derived Products     Pt doesn't eat pork    Patient Measurements: Height: 6\' 1"  (185.4 cm) Weight: 182 lb (82.6 kg) IBW/kg (Calculated) : 79.9 Heparin Dosing Weight: 82.6kg  Vital Signs: Temp: 99.7 F (37.6 C) (06/04 2155) Temp Source: Oral (06/04 2155) BP: 90/67 (06/04 2155) Pulse Rate: 77 (06/04 2155)  Labs: Recent Labs    11/11/17 0025 11/11/17 0308 11/13/17 0511 11/13/17 1611 11/13/17 2255  HGB 13.1 12.7* 12.1*  --   --   HCT 38.8* 39.1 36.9*  --   --   PLT 236 229 210  --   --   APTT 28  --   --   --   --   LABPROT 13.8  --   --   --   --   INR 1.07  --   --   --   --   HEPARINUNFRC  --   --   --  0.46 0.38  CREATININE 1.02 1.05 0.98  --   --     Estimated Creatinine Clearance: 103 mL/min (by C-G formula based on SCr of 0.98 mg/dL).   Medical History: Past Medical History:  Diagnosis Date  . Anxiety   . Arterial occlusive disease    multilevel arterial occlusive disease/notes 11/13/2017  . Arthritis    "left knee" (11/13/2017)  . Chronic back pain   . Chronic lower back pain   . Depression   . Gastric ulcer due to Helicobacter pylori 1103  . GERD (gastroesophageal reflux disease)   . High cholesterol   . Hypertension   . IBS (irritable bowel syndrome)   . Mesenteric adenitis 2011   presumed/H&P  . Migraines    "use to suffer migraines years ago" (11/13/2017)  . Peripheral vascular disease ([place])   . Pneumonia 2011  . Ulcer     Medications:  Infusions:  . heparin 1,400 Units/hr (11/13/17 1037)  . lactated ringers 75 mL/hr at 11/13/17 1031    Assessment: 50 yom presented to the ED with worsening foot pain. Recently at [facility] with similar complaint. Restarting IV heparin. Baseline Hgb is slightly low and platelets are WNL. He is not on  anticoagulation PTA.   6/4 PM update: heparin level therapeutic x 2  Goal of Therapy:  Heparin level 0.3-0.7 units/ml Monitor platelets by anticoagulation protocol: Yes   Plan:  Continue IV heparin gtt at 1400 units/hr Daily CBC/HL Monitor for bleeding Likely surgery 11/15/17  Narda Bonds, PharmD, [facility] Clinical Pharmacist Phone: [phone]

## 2017-11-13 NOTE — ED Notes (Signed)
Attempted report 

## 2017-11-14 ENCOUNTER — Inpatient Hospital Stay (HOSPITAL_COMMUNITY): Payer: BLUE CROSS/BLUE SHIELD

## 2017-11-14 DIAGNOSIS — I998 Other disorder of circulatory system: Secondary | ICD-10-CM

## 2017-11-14 DIAGNOSIS — I739 Peripheral vascular disease, unspecified: Secondary | ICD-10-CM

## 2017-11-14 DIAGNOSIS — Z01818 Encounter for other preprocedural examination: Secondary | ICD-10-CM

## 2017-11-14 LAB — BASIC METABOLIC PANEL
Anion gap: 6 (ref 5–15)
BUN: 12 mg/dL (ref 6–20)
CHLORIDE: 104 mmol/L (ref 101–111)
CO2: 27 mmol/L (ref 22–32)
CREATININE: 1 mg/dL (ref 0.61–1.24)
Calcium: 8.8 mg/dL — ABNORMAL LOW (ref 8.9–10.3)
GFR calc Af Amer: >60 mL/min (ref >60)
GFR calc non Af Amer: >60 mL/min (ref >60)
Glucose, Bld: 103 mg/dL — ABNORMAL HIGH (ref 65–99)
Potassium: 3.8 mmol/L (ref 3.5–5.1)
Sodium: 137 mmol/L (ref 135–145)

## 2017-11-14 LAB — CBC
HEMATOCRIT: 34 % — AB (ref 39.0–52.0)
Hemoglobin: 11 g/dL — ABNORMAL LOW (ref 13.0–17.0)
MCH: 30.1 pg (ref 26.0–34.0)
MCHC: 32.4 g/dL (ref 30.0–36.0)
MCV: 92.9 fL (ref 78.0–100.0)
PLATELETS: 220 10*3/uL (ref 150–400)
RBC: 3.66 MIL/uL — ABNORMAL LOW (ref 4.22–5.81)
RDW: 12.3 % (ref 11.5–15.5)
WBC: 9.2 10*3/uL (ref 4.0–10.5)

## 2017-11-14 LAB — HEPARIN LEVEL (UNFRACTIONATED): HEPARIN UNFRACTIONATED: 0.58 [IU]/mL (ref 0.30–0.70)

## 2017-11-14 LAB — PREPARE RBC (CROSSMATCH)

## 2017-11-14 LAB — ABO/RH: ABO/RH(D): O POS

## 2017-11-14 MED ORDER — SODIUM CHLORIDE 0.9 % IV SOLN
Freq: Once | INTRAVENOUS | Status: AC
  Administered 2017-11-18: 10:00:00 via INTRAVENOUS

## 2017-11-14 MED ORDER — SODIUM CHLORIDE 0.9 % IV BOLUS
500.0000 mL | Freq: Once | INTRAVENOUS | Status: AC
  Administered 2017-11-14: 500 mL via INTRAVENOUS

## 2017-11-14 MED ORDER — HEPARIN (PORCINE) IN NACL 100-0.45 UNIT/ML-% IJ SOLN
1400.0000 [IU]/h | INTRAMUSCULAR | Status: DC
  Administered 2017-11-14: 1400 [IU]/h via INTRAVENOUS
  Filled 2017-11-14 (×2): qty 250

## 2017-11-14 MED ORDER — OXYCODONE HCL 5 MG PO TABS
5.0000 mg | ORAL_TABLET | ORAL | Status: DC | PRN
  Administered 2017-11-14: 5 mg via ORAL
  Filled 2017-11-14: qty 1

## 2017-11-14 MED ORDER — OXYCODONE HCL 5 MG PO TABS: 5.0000 mg | ORAL_TABLET | ORAL | Status: DC | PRN

## 2017-11-14 MED ORDER — OXYCODONE HCL 5 MG PO TABS
10.0000 mg | ORAL_TABLET | Freq: Once | ORAL | Status: AC
  Administered 2017-11-14: 10 mg via ORAL
  Filled 2017-11-14: qty 2

## 2017-11-14 MED ORDER — OXYCODONE HCL 5 MG PO TABS
10.0000 mg | ORAL_TABLET | Freq: Once | ORAL | Status: AC
  Administered 2017-11-15: 10 mg via ORAL
  Filled 2017-11-14: qty 2

## 2017-11-14 MED ORDER — CEFAZOLIN SODIUM-DEXTROSE 1-4 GM/50ML-% IV SOLN: 1.0000 g | INTRAVENOUS | Status: DC

## 2017-11-14 MED ORDER — CEFAZOLIN SODIUM-DEXTROSE 2-4 GM/100ML-% IV SOLN
2.0000 g | INTRAVENOUS | Status: AC
  Administered 2017-11-15 (×2): 2 g via INTRAVENOUS
  Filled 2017-11-14: qty 100

## 2017-11-14 MED ORDER — IBUPROFEN 400 MG PO TABS
400.0000 mg | ORAL_TABLET | Freq: Once | ORAL | Status: AC
  Administered 2017-11-14: 400 mg via ORAL
  Filled 2017-11-14: qty 1

## 2017-11-14 MED ORDER — HYDROMORPHONE HCL 2 MG/ML IJ SOLN
1.0000 mg | Freq: Once | INTRAMUSCULAR | Status: AC
  Administered 2017-11-14: 1 mg via INTRAVENOUS
  Filled 2017-11-14: qty 1

## 2017-11-14 NOTE — Progress Notes (Signed)
Per Dr. Geronimo Running who just came in to see patient to discuss tomorrow's procedure, telemetry is no longer needed by the patient.  CCMD was made aware.

## 2017-11-14 NOTE — Anesthesia Preprocedure Evaluation (Addendum)
Anesthesia Evaluation  Patient identified by MRN, date of birth, ID band Patient awake    Reviewed: Allergy & Precautions, H&P , NPO status , Patient's Chart, lab work & pertinent test results, reviewed documented beta blocker date and time   Airway Mallampati: II  TM Distance: >3 FB Neck ROM: Full    Dental no notable dental hx. (+) Teeth Intact, Dental Advisory Given   Pulmonary neg pulmonary ROS, Current Smoker,    Pulmonary exam normal breath sounds clear to auscultation       Cardiovascular Exercise Tolerance: Good hypertension, Pt. on medications and Pt. on home beta blockers + Peripheral Vascular Disease   Rhythm:Regular Rate:Normal     Neuro/Psych  Headaches, Anxiety Depression    GI/Hepatic Neg liver ROS, PUD, GERD  ,  Endo/Other  negative endocrine ROS  Renal/GU negative Renal ROS  negative genitourinary   Musculoskeletal   Abdominal   Peds  Hematology negative hematology ROS (+)   Anesthesia Other Findings   Reproductive/Obstetrics negative OB ROS                            Anesthesia Physical Anesthesia Plan  ASA: III  Anesthesia Plan: General   Post-op Pain Management:    Induction: Intravenous  PONV Risk Score and Plan: 2 and Ondansetron, Dexamethasone and Midazolam  Airway Management Planned: Oral ETT  Additional Equipment: Arterial line, CVP and Ultrasound Guidance Line Placement  Intra-op Plan:   Post-operative Plan: Extubation in OR and Possible Post-op intubation/ventilation  Informed Consent: I have reviewed the patients History and Physical, chart, labs and discussed the procedure including the risks, benefits and alternatives for the proposed anesthesia with the patient or authorized representative who has indicated his/her understanding and acceptance.   Dental advisory given  Plan Discussed with: CRNA  Anesthesia Plan Comments:          Anesthesia Quick Evaluation

## 2017-11-14 NOTE — H&P (View-Only) (Signed)
   VASCULAR SURGERY ASSESSMENT & PLAN:   His echo looks good.  Systolic function was normal with an estimated ejection fraction of 60 to 65%.  As per the discussion with Dr. Acie Fredrickson no further cardiac work-up is indicated preoperatively.  He is scheduled for aortobifemoral bypass grafting and right femoropopliteal bypass grafting tomorrow.  I have previously had a lengthy discussion with the patient and his wife about the indications for surgery and the potential complications.  His vein map is pending.  We will stop his heparin in the morning.  SUBJECTIVE:   Pain seems to be under better control  PHYSICAL EXAM:   Vitals:   11/13/17 1427 11/13/17 1454 11/13/17 2155 11/14/17 0540  BP:  (!) 146/102 90/67 (!) 141/101  Pulse:  96 77 72  Resp:   15 20  Temp:  99.7 F (37.6 C) 99.7 F (37.6 C) 98.7 F (37.1 C)  TempSrc:  Oral Oral Oral  SpO2: 95% 95% 100% 97%  Weight:      Height:       No change in right foot with some ischemic changes to the toes.  LABS:   Lab Results  Component Value Date   WBC 9.2 11/14/2017   HGB 11.0 (L) 11/14/2017   HCT 34.0 (L) 11/14/2017   MCV 92.9 11/14/2017   PLT 220 11/14/2017   Lab Results  Component Value Date   CREATININE 1.00 11/14/2017   Lab Results  Component Value Date   INR 1.07 11/11/2017    PROBLEM LIST:    Principal Problem:   Pain of right lower extremity due to ischemia Active Problems:   SMOKER   Depression with anxiety   Essential hypertension   Hyperlipidemia LDL goal <70   CURRENT MEDS:   . atorvastatin  40 mg Oral Daily  . gabapentin  200 mg Oral BID  . hydrochlorothiazide  25 mg Oral Daily  . metoprolol succinate  100 mg Oral QHS  . nicotine  14 mg Transdermal Daily  . sodium chloride flush  3 mL Intravenous Q12H    Deitra Mayo Beeper: [phone] Office: [phone] 11/14/2017

## 2017-11-14 NOTE — Progress Notes (Signed)
   VASCULAR SURGERY ASSESSMENT & PLAN:   His echo looks good.  Systolic function was normal with an estimated ejection fraction of 60 to 65%.  As per the discussion with Dr. Acie Fredrickson no further cardiac work-up is indicated preoperatively.  He is scheduled for aortobifemoral bypass grafting and right femoropopliteal bypass grafting tomorrow.  I have previously had a lengthy discussion with the patient and his wife about the indications for surgery and the potential complications.  His vein map is pending.  We will stop his heparin in the morning.  SUBJECTIVE:   Pain seems to be under better control  PHYSICAL EXAM:   Vitals:   11/13/17 1427 11/13/17 1454 11/13/17 2155 11/14/17 0540  BP:  (!) 146/102 90/67 (!) 141/101  Pulse:  96 77 72  Resp:   15 20  Temp:  99.7 F (37.6 C) 99.7 F (37.6 C) 98.7 F (37.1 C)  TempSrc:  Oral Oral Oral  SpO2: 95% 95% 100% 97%  Weight:      Height:       No change in right foot with some ischemic changes to the toes.  LABS:   Lab Results  Component Value Date   WBC 9.2 11/14/2017   HGB 11.0 (L) 11/14/2017   HCT 34.0 (L) 11/14/2017   MCV 92.9 11/14/2017   PLT 220 11/14/2017   Lab Results  Component Value Date   CREATININE 1.00 11/14/2017   Lab Results  Component Value Date   INR 1.07 11/11/2017    PROBLEM LIST:    Principal Problem:   Pain of right lower extremity due to ischemia Active Problems:   SMOKER   Depression with anxiety   Essential hypertension   Hyperlipidemia LDL goal <70   CURRENT MEDS:   . atorvastatin  40 mg Oral Daily  . gabapentin  200 mg Oral BID  . hydrochlorothiazide  25 mg Oral Daily  . metoprolol succinate  100 mg Oral QHS  . nicotine  14 mg Transdermal Daily  . sodium chloride flush  3 mL Intravenous Q12H    Deitra Mayo Beeper: [phone] Office: [phone] 11/14/2017

## 2017-11-14 NOTE — Progress Notes (Signed)
VASCULAR LAB PRELIMINARY  ARTERIAL  ABI completed:  RIGHT    LEFT    PRESSURE WAVEFORM  PRESSURE WAVEFORM  BRACHIAL 171 Triphasic BRACHIAL 183 Triphasic  DP 86 Monophasic DP  Absent  AT   AT    PT 77 Monophasic PT 54 Monophasic  PER   PER    GREAT TOE  Absent GREAT TOE  Absent    RIGHT LEFT  ABI 0.47 0.30   Bilateral ABIs are suggestive of severe arterial insufficiency at rest. Unable to insonate bilateral great toes via PPG.    Lower Extremity Vein Map    Right Great Saphenous Vein   Segment Diameter Comment  1. Origin 7.73mm   2. High Thigh 3.31mm Branch  3. Mid Thigh 4.62mm Branch  4. Low Thigh 2.44mm Branch  5. At Knee 2.24mm   6. High Calf 1.59mm Branch  7. Low Calf 1.27mm Branch  8. Ankle 2.67mm                 Right Small Saphenous Vein  Segment Diameter Comment  1. Origin 1.17mm   2. High Calf 1.65mm   3. Low Calf 0.41mm   4. Ankle  Unable to visualize                Left Great Saphenous Vein   Segment Diameter Comment  1. Origin 5.24mm   2. High Thigh 4.32mm   3. Mid Thigh 2.47mm Branch  4. Low Thigh 2.49mm Branch  5. At Knee 2.51mm   6. High Calf 1.32mm Branch  7. Low Calf 1.27mm   8. Ankle 1.6mm                 Left Small Saphenous Vein Unable to visualize   Note: upon reviewing this patient's chart, I saw the request for stat lower extremity vein mapping was placed 11/13/17 with the incorrect order of "[place] Saphenous mapping". I changed the order to "VAS [place] lower extremity saphenous mapping." If the order does not say "VAS," unfortunately we are unable to see the order. This is the reason the test was not completed "stat" on 11/13/17.  11/14/2017 4:25 PM Maudry Mayhew, BS, RVT, RDCS, RDMS

## 2017-11-14 NOTE — Progress Notes (Signed)
ANTICOAGULATION CONSULT NOTE   Pharmacy Consult:  Heparin Indication: PVD with ischemia  Allergies  Allergen Reactions  . Morphine And Related Other (See Comments)    hallucinations  . Pork-Derived Products     Pt doesn't eat pork    Patient Measurements: Height: 6\' 1"  (185.4 cm) Weight: 182 lb (82.6 kg) IBW/kg (Calculated) : 79.9 Heparin Dosing Weight: 83 kg  Vital Signs: Temp: 98.7 F (37.1 C) (06/05 0540) Temp Source: Oral (06/05 0540) BP: 141/101 (06/05 0540) Pulse Rate: 72 (06/05 0540)  Labs: Recent Labs    11/13/17 0511 11/13/17 1611 11/13/17 2255 11/14/17 0532  HGB 12.1*  --   --  11.0*  HCT 36.9*  --   --  34.0*  PLT 210  --   --  220  HEPARINUNFRC  --  0.46 0.38 0.58  CREATININE 0.98  --   --  1.00    Estimated Creatinine Clearance: 101 mL/min (by C-G formula based on SCr of 1 mg/dL).    Assessment: 41 YOM presented to the ED with worsening foot pain. Recently seen at [facility] with similar complaint.  Pharmacy consulted to manage IV heparin for PVD with ischemia.  Planning bypass graft in AM.   Heparin level is therapeutic; no bleeding reported.   Goal of Therapy:  Heparin level 0.3-0.7 units/ml Monitor platelets by anticoagulation protocol: Yes    Plan:  Continue heparin gtt at 1400 units/hr.  Stop on 11/15/17 at 0600 per MD. Daily heparin level and CBC   Liticia Gasior D. Mina Marble, PharmD, BCPS, BCCCP Pager:  [phone] 11/14/2017, 8:54 AM

## 2017-11-14 NOTE — Progress Notes (Signed)
PROGRESS NOTE    Tony [name]  QMG:[id number] DOB: 01-03-1966 DOA: 11/13/2017 PCP: [name], MD      Brief Narrative:  Tony [name] is a 52 y.o. M with HTN, IBS, and GERD presenting with LE pain.     Assessment & Plan:  Critical lower limb ischemia -Continue Dilaudid 1 mg q2hrs PRN -Continue acetaminophen, add oxycodone for breakthrough pain -Consult Vascular surgery, bypass planned tomorrow  HTN -Continue HCTZ, metop -Continue statin  Smoking -Continue nicotine patch -Smoking cessation recommended, modalities discussed    DVT prophylaxis: N/A on heparin Code Status: FULL Family Communication: None presetn MDM and disposition Plan: The below labs and imaging reports were reviewed and summarized above.    The patient was admitted with critical lower limb ischemia.  Had preop claerance yesterday, echo normal, to OR tomorrow.  Pain contorl difficult, working on it today   Consultants:   VVS  Procedures:   None  Antimicrobials:   None    Subjective: A lot of pain, numbness, tingling in both legs, worse on the left.  Tiny ulcers on the left leg.    Objective: Vitals:   11/13/17 1427 11/13/17 1454 11/13/17 2155 11/14/17 0540  BP:  (!) 146/102 90/67 (!) 141/101  Pulse:  96 77 72  Resp:   15 20  Temp:  99.7 F (37.6 C) 99.7 F (37.6 C) 98.7 F (37.1 C)  TempSrc:  Oral Oral Oral  SpO2: 95% 95% 100% 97%  Weight:      Height:        Intake/Output Summary (Last 24 hours) at 11/14/2017 1945 Last data filed at 11/14/2017 1400 Gross per 24 hour  Intake 1826.37 ml  Output 750 ml  Net 1076.37 ml   Filed Weights   11/13/17 0042 11/13/17 0052  Weight: 82.6 kg (182 lb) 82.6 kg (182 lb)    Examination: General appearance:  adult male, alert and in moderate distress from pain.   HEENT: Anicteric, conjunctiva pink, lids and lashes normal. No nasal deformity, discharge, epistaxis.  Lips moist.   Skin: Warm and dry.  no jaundice.  No suspicious rashes  or lesions. Cardiac: RRR, nl S1-S2, no murmurs appreciated.  Capillary refill is brisk.  JVPnot visible.  No LE edema.  Radial  pulses 2+ and symmetric.Dp pulses diminished.   Respiratory: Normal respiratory rate and rhythm.  CTAB without rales or wheezes. Abdomen: Abdomen soft.  no TTP. No ascites, distension, hepatosplenomegaly.   MSK: No deformities or effusions .  The bilateral le are painful, the right are cool toes.  They are dusky, they are mildly swollen, there are no clear ulcerations. Neuro: Awake and alert.  EOMI, moves all extremities. Speech fluent.    Psych: Sensorium intact and responding to questions, attention normal. Affect normal.  Judgment and insight appear normal.    Data Reviewed: I have personally reviewed following labs and imaging studies:  CBC: Recent Labs  Lab 11/11/17 0025 11/11/17 0308 11/13/17 0511 11/14/17 0532  WBC 10.1 9.1 8.0 9.2  NEUTROABS 6.6  --  4.3  --   HGB 13.1 12.7* 12.1* 11.0*  HCT 38.8* 39.1 36.9* 34.0*  MCV 91.5 93.3 93.2 92.9  PLT 236 229 210 326   Basic Metabolic Panel: Recent Labs  Lab 11/11/17 0025 11/11/17 0308 11/13/17 0511 11/14/17 0532  NA 137  --  139 137  K 3.6  --  3.7 3.8  CL 105  --  102 104  CO2 22  --  27 27  GLUCOSE 91  --  103* 103*  BUN 16  --  12 12  CREATININE 1.02 1.05 0.98 1.00  CALCIUM 9.3  --  9.4 8.8*   GFR: Estimated Creatinine Clearance: 101 mL/min (by C-G formula based on SCr of 1 mg/dL). Liver Function Tests: No results for input(s): AST, ALT, ALKPHOS, BILITOT, PROT, ALBUMIN in the last 168 hours. No results for input(s): LIPASE, AMYLASE in the last 168 hours. No results for input(s): AMMONIA in the last 168 hours. Coagulation Profile: Recent Labs  Lab 11/11/17 0025  INR 1.07   Cardiac Enzymes: No results for input(s): CKTOTAL, CKMB, CKMBINDEX, TROPONINI in the last 168 hours. BNP (last 3 results) No results for input(s): PROBNP in the last 8760 hours. HbA1C: Recent Labs     11/13/17 0511  HGBA1C 5.0   CBG: No results for input(s): GLUCAP in the last 168 hours. Lipid Profile: Recent Labs    11/13/17 0511  CHOL 116  HDL 25*  LDLCALC 64  TRIG 137  CHOLHDL 4.6   Thyroid Function Tests: No results for input(s): TSH, T4TOTAL, FREET4, T3FREE, THYROIDAB in the last 72 hours. Anemia Panel: No results for input(s): VITAMINB12, FOLATE, FERRITIN, TIBC, IRON, RETICCTPCT in the last 72 hours. Urine analysis:    Component Value Date/Time   COLORURINE YELLOW 11/11/2017 [place] 11/11/2017 0453   LABSPEC 1.021 11/11/2017 0453   PHURINE 6.0 11/11/2017 0453   GLUCOSEU NEGATIVE 11/11/2017 0453   HGBUR NEGATIVE 11/11/2017 0453   HGBUR negative 07/26/2009 1050   BILIRUBINUR NEGATIVE 11/11/2017 0453   KETONESUR 5 (A) 11/11/2017 0453   PROTEINUR NEGATIVE 11/11/2017 0453   UROBILINOGEN 0.2 08/21/2011 1911   NITRITE NEGATIVE 11/11/2017 0453   LEUKOCYTESUR NEGATIVE 11/11/2017 0453   Sepsis Labs: @LABRCNTIP (procalcitonin:4,lacticacidven:4)  )No results found for this or any previous visit (from the past 240 hour(s)).       Radiology Studies: Dg Chest 2 View  Result Date: 11/13/2017 CLINICAL DATA:  Preoperative evaluation for vascular surgery EXAM: CHEST - 2 VIEW COMPARISON:  December 26, 2016 FINDINGS: There is minimal atelectatic change in the left base. Lungs elsewhere are clear. Heart size and pulmonary vascularity are normal. No adenopathy. No bone lesions. IMPRESSION: Minimal atelectatic change left base.  No edema or consolidation. Electronically Signed   By: Lowella Grip III M.D.   On: 11/13/2017 08:14        Scheduled Meds: . atorvastatin  40 mg Oral Daily  . gabapentin  200 mg Oral BID  . hydrochlorothiazide  25 mg Oral Daily  . metoprolol succinate  100 mg Oral QHS  . nicotine  14 mg Transdermal Daily  . sodium chloride flush  3 mL Intravenous Q12H   Continuous Infusions: . sodium chloride    . [START ON 11/15/2017]   ceFAZolin (ANCEF) IV    . heparin 1,400 Units/hr (11/14/17 1447)  . lactated ringers 75 mL/hr at 11/14/17 1144     LOS: 1 day    Time spent: 25 minutes    Edwin Dada, MD [facility] 11/14/2017, 7:45 PM     Pager [phone] --- please page though AMION:  [email or web address] Password [email or web address] If 7PM-7AM, please contact night-coverage

## 2017-11-15 ENCOUNTER — Inpatient Hospital Stay (HOSPITAL_COMMUNITY): Payer: BLUE CROSS/BLUE SHIELD

## 2017-11-15 ENCOUNTER — Inpatient Hospital Stay (HOSPITAL_COMMUNITY): Payer: BLUE CROSS/BLUE SHIELD | Admitting: Certified Registered Nurse Anesthetist

## 2017-11-15 ENCOUNTER — Encounter (HOSPITAL_COMMUNITY): Payer: Self-pay | Admitting: Certified Registered Nurse Anesthetist

## 2017-11-15 ENCOUNTER — Encounter (HOSPITAL_COMMUNITY)
Admission: EM | Disposition: A | Discharge status: Home Health | Payer: Self-pay | Source: Home / Self Care | Attending: Vascular Surgery

## 2017-11-15 DIAGNOSIS — I7409 Other arterial embolism and thrombosis of abdominal aorta: Secondary | ICD-10-CM | POA: Diagnosis present

## 2017-11-15 HISTORY — PX: FEMORAL-POPLITEAL BYPASS GRAFT: SHX937

## 2017-11-15 HISTORY — PX: AORTA - BILATERAL FEMORAL ARTERY BYPASS GRAFT: SHX1175

## 2017-11-15 HISTORY — PX: INTRAOPERATIVE ARTERIOGRAM: SHX5157

## 2017-11-15 LAB — POCT I-STAT 7, (LYTES, BLD GAS, ICA,H+H)
ACID-BASE DEFICIT: 2 mmol/L (ref 0.0–2.0)
Acid-base deficit: 2 mmol/L (ref 0.0–2.0)
Bicarbonate: 25 mmol/L (ref 20.0–28.0)
Bicarbonate: 22.8 mmol/L (ref 20.0–28.0)
Bicarbonate: 23 mmol/L (ref 20.0–28.0)
CALCIUM ION: 1.21 mmol/L (ref 1.15–1.40)
CALCIUM ION: 1.15 mmol/L (ref 1.15–1.40)
Calcium, Ion: 1.11 mmol/L — ABNORMAL LOW (ref 1.15–1.40)
HCT: 33 % — ABNORMAL LOW (ref 39.0–52.0)
HEMATOCRIT: 26 % — AB (ref 39.0–52.0)
HEMATOCRIT: 34 % — AB (ref 39.0–52.0)
HEMOGLOBIN: 11.2 g/dL — AB (ref 13.0–17.0)
HEMOGLOBIN: 11.6 g/dL — AB (ref 13.0–17.0)
Hemoglobin: 8.8 g/dL — ABNORMAL LOW (ref 13.0–17.0)
O2 SAT: 95 %
O2 Saturation: 98 %
O2 Saturation: 94 %
PCO2 ART: 40.1 mmHg (ref 32.0–48.0)
PCO2 ART: 38.3 mmHg (ref 32.0–48.0)
PO2 ART: 71 mmHg — AB (ref 83.0–108.0)
PO2 ART: 113 mmHg — AB (ref 83.0–108.0)
Patient temperature: 35.8
Patient temperature: 36
Potassium: 3.4 mmol/L — ABNORMAL LOW (ref 3.5–5.1)
Potassium: 3.8 mmol/L (ref 3.5–5.1)
Potassium: 3.8 mmol/L (ref 3.5–5.1)
SODIUM: 140 mmol/L (ref 135–145)
Sodium: 140 mmol/L (ref 135–145)
Sodium: 139 mmol/L (ref 135–145)
TCO2: 26 mmol/L (ref 22–32)
TCO2: 24 mmol/L (ref 22–32)
TCO2: 24 mmol/L (ref 22–32)
pCO2 arterial: 39.9 mmHg (ref 32.0–48.0)
pH, Arterial: 7.398 (ref 7.350–7.450)
pH, Arterial: 7.38 (ref 7.350–7.450)
pH, Arterial: 7.365 (ref 7.350–7.450)
pO2, Arterial: 71 mmHg — ABNORMAL LOW (ref 83.0–108.0)

## 2017-11-15 LAB — BASIC METABOLIC PANEL
ANION GAP: 10 (ref 5–15)
Anion gap: 5 (ref 5–15)
BUN: 10 mg/dL (ref 6–20)
BUN: 8 mg/dL (ref 6–20)
CALCIUM: 9.2 mg/dL (ref 8.9–10.3)
CHLORIDE: 106 mmol/L (ref 101–111)
CO2: 32 mmol/L (ref 22–32)
CO2: 23 mmol/L (ref 22–32)
CREATININE: 1.01 mg/dL (ref 0.61–1.24)
Calcium: 8.4 mg/dL — ABNORMAL LOW (ref 8.9–10.3)
Chloride: 105 mmol/L (ref 101–111)
Creatinine, Ser: 1.01 mg/dL (ref 0.61–1.24)
GFR calc Af Amer: >60 mL/min (ref >60)
GFR calc non Af Amer: >60 mL/min (ref >60)
GLUCOSE: 161 mg/dL — AB (ref 65–99)
Glucose, Bld: 99 mg/dL (ref 65–99)
POTASSIUM: 5.2 mmol/L — AB (ref 3.5–5.1)
Potassium: 4.6 mmol/L (ref 3.5–5.1)
SODIUM: 142 mmol/L (ref 135–145)
Sodium: 139 mmol/L (ref 135–145)

## 2017-11-15 LAB — CBC
HCT: 33.2 % — ABNORMAL LOW (ref 39.0–52.0)
HEMATOCRIT: 40.5 % (ref 39.0–52.0)
HEMOGLOBIN: 10.6 g/dL — AB (ref 13.0–17.0)
HEMOGLOBIN: 13.5 g/dL (ref 13.0–17.0)
MCH: 29.9 pg (ref 26.0–34.0)
MCH: 30.3 pg (ref 26.0–34.0)
MCHC: 31.9 g/dL (ref 30.0–36.0)
MCHC: 33.3 g/dL (ref 30.0–36.0)
MCV: 93.8 fL (ref 78.0–100.0)
MCV: 90.8 fL (ref 78.0–100.0)
Platelets: 237 10*3/uL (ref 150–400)
Platelets: 244 10*3/uL (ref 150–400)
RBC: 3.54 MIL/uL — ABNORMAL LOW (ref 4.22–5.81)
RBC: 4.46 MIL/uL (ref 4.22–5.81)
RDW: 12.3 % (ref 11.5–15.5)
RDW: 14 % (ref 11.5–15.5)
WBC: 7.2 10*3/uL (ref 4.0–10.5)
WBC: 14.2 10*3/uL — AB (ref 4.0–10.5)

## 2017-11-15 LAB — BLOOD GAS, ARTERIAL
Acid-base deficit: 2.1 mmol/L — ABNORMAL HIGH (ref 0.0–2.0)
Bicarbonate: 22.7 mmol/L (ref 20.0–28.0)
DRAWN BY: 41686
O2 CONTENT: 4 L/min
O2 SAT: 90.4 %
PATIENT TEMPERATURE: 98.6
PO2 ART: 64.3 mmHg — AB (ref 83.0–108.0)
pCO2 arterial: 41.9 mmHg (ref 32.0–48.0)
pH, Arterial: 7.352 (ref 7.350–7.450)

## 2017-11-15 LAB — SURGICAL PCR SCREEN
MRSA, PCR: NEGATIVE
STAPHYLOCOCCUS AUREUS: NEGATIVE

## 2017-11-15 LAB — GLUCOSE, CAPILLARY: Glucose-Capillary: 168 mg/dL — ABNORMAL HIGH (ref 65–99)

## 2017-11-15 LAB — APTT: APTT: 29 s (ref 24–36)

## 2017-11-15 LAB — PROTIME-INR
INR: 1.01
Prothrombin Time: 13.2 seconds (ref 11.4–15.2)

## 2017-11-15 LAB — MAGNESIUM: Magnesium: 1.7 mg/dL (ref 1.7–2.4)

## 2017-11-15 LAB — PREPARE RBC (CROSSMATCH)

## 2017-11-15 LAB — HEPARIN LEVEL (UNFRACTIONATED): HEPARIN UNFRACTIONATED: 0.15 [IU]/mL — AB (ref 0.30–0.70)

## 2017-11-15 SURGERY — CREATION, BYPASS, ARTERIAL, AORTA TO FEMORAL, BILATERAL, USING GRAFT
Anesthesia: General | Site: Leg Upper | Laterality: Right

## 2017-11-15 MED ORDER — PROTAMINE SULFATE 10 MG/ML IV SOLN
INTRAVENOUS | Status: AC
  Filled 2017-11-15: qty 5

## 2017-11-15 MED ORDER — BISACODYL 10 MG RE SUPP: 10.0000 mg | Freq: Every day | RECTAL | Status: DC | PRN

## 2017-11-15 MED ORDER — PAPAVERINE HCL 30 MG/ML IJ SOLN
INTRAMUSCULAR | Status: AC
  Filled 2017-11-15: qty 2

## 2017-11-15 MED ORDER — ALBUMIN HUMAN 5 % IV SOLN
INTRAVENOUS | Status: DC | PRN
  Administered 2017-11-15: 11:00:00 via INTRAVENOUS

## 2017-11-15 MED ORDER — PROPOFOL 10 MG/ML IV BOLUS
INTRAVENOUS | Status: DC | PRN
  Administered 2017-11-15: 50 mg via INTRAVENOUS
  Administered 2017-11-15 (×2): 30 mg via INTRAVENOUS
  Administered 2017-11-15: 150 mg via INTRAVENOUS
  Administered 2017-11-15: 30 mg via INTRAVENOUS

## 2017-11-15 MED ORDER — DIPHENHYDRAMINE HCL 12.5 MG/5ML PO ELIX
12.5000 mg | ORAL_SOLUTION | Freq: Four times a day (QID) | ORAL | Status: DC | PRN
  Filled 2017-11-15: qty 5

## 2017-11-15 MED ORDER — SODIUM CHLORIDE 0.9 % IV SOLN
Freq: Once | INTRAVENOUS | Status: AC
  Administered 2017-11-16: 04:00:00 via INTRAVENOUS

## 2017-11-15 MED ORDER — PHENOL 1.4 % MT LIQD: 1.0000 | OROMUCOSAL | Status: DC | PRN

## 2017-11-15 MED ORDER — POTASSIUM CHLORIDE CRYS ER 20 MEQ PO TBCR
20.0000 meq | EXTENDED_RELEASE_TABLET | Freq: Once | ORAL | Status: AC | PRN
  Administered 2017-11-19: 40 meq via ORAL
  Filled 2017-11-15: qty 2

## 2017-11-15 MED ORDER — CEFAZOLIN SODIUM 1 G IJ SOLR
INTRAMUSCULAR | Status: AC
  Filled 2017-11-15: qty 50

## 2017-11-15 MED ORDER — ONDANSETRON HCL 4 MG/2ML IJ SOLN: 4.0000 mg | Freq: Four times a day (QID) | INTRAMUSCULAR | Status: DC | PRN

## 2017-11-15 MED ORDER — PHENYLEPHRINE 40 MCG/ML (10ML) SYRINGE FOR IV PUSH (FOR BLOOD PRESSURE SUPPORT)
PREFILLED_SYRINGE | INTRAVENOUS | Status: AC
  Filled 2017-11-15: qty 10

## 2017-11-15 MED ORDER — DEXAMETHASONE SODIUM PHOSPHATE 10 MG/ML IJ SOLN
INTRAMUSCULAR | Status: AC
  Filled 2017-11-15: qty 1

## 2017-11-15 MED ORDER — HYDRALAZINE HCL 20 MG/ML IJ SOLN
INTRAMUSCULAR | Status: AC
  Filled 2017-11-15: qty 1

## 2017-11-15 MED ORDER — METOPROLOL TARTRATE 5 MG/5ML IV SOLN
5.0000 mg | Freq: Four times a day (QID) | INTRAVENOUS | Status: DC
  Administered 2017-11-15 – 2017-11-23 (×31): 5 mg via INTRAVENOUS
  Filled 2017-11-15 (×31): qty 5

## 2017-11-15 MED ORDER — HYDROMORPHONE HCL 2 MG/ML IJ SOLN
0.5000 mg | Freq: Once | INTRAMUSCULAR | Status: AC
  Administered 2017-11-15: 0.5 mg via INTRAVENOUS

## 2017-11-15 MED ORDER — PHENYLEPHRINE HCL 10 MG/ML IJ SOLN
INTRAVENOUS | Status: DC | PRN
  Administered 2017-11-15: 20 ug/min via INTRAVENOUS

## 2017-11-15 MED ORDER — MIDAZOLAM HCL 5 MG/5ML IJ SOLN
INTRAMUSCULAR | Status: DC | PRN
  Administered 2017-11-15: 2 mg via INTRAVENOUS

## 2017-11-15 MED ORDER — LIDOCAINE 2% (20 MG/ML) 5 ML SYRINGE
INTRAMUSCULAR | Status: AC
  Filled 2017-11-15: qty 5

## 2017-11-15 MED ORDER — PAPAVERINE HCL 30 MG/ML IJ SOLN
INTRAMUSCULAR | Status: DC | PRN
  Administered 2017-11-15: 60 mg

## 2017-11-15 MED ORDER — NALOXONE HCL 0.4 MG/ML IJ SOLN: 0.4000 mg | INTRAMUSCULAR | Status: DC | PRN

## 2017-11-15 MED ORDER — DEXTROSE-NACL 5-0.45 % IV SOLN
INTRAVENOUS | Status: DC
  Administered 2017-11-15 – 2017-11-20 (×9): via INTRAVENOUS

## 2017-11-15 MED ORDER — ONDANSETRON HCL 4 MG/2ML IJ SOLN
INTRAMUSCULAR | Status: DC | PRN
  Administered 2017-11-15: 4 mg via INTRAVENOUS

## 2017-11-15 MED ORDER — HEPARIN SODIUM (PORCINE) 1000 UNIT/ML IJ SOLN
INTRAMUSCULAR | Status: DC | PRN
  Administered 2017-11-15: 8000 [IU] via INTRAVENOUS
  Administered 2017-11-15: 6000 [IU] via INTRAVENOUS

## 2017-11-15 MED ORDER — ONDANSETRON HCL 4 MG/2ML IJ SOLN
INTRAMUSCULAR | Status: AC
  Filled 2017-11-15: qty 2

## 2017-11-15 MED ORDER — HYDRALAZINE HCL 20 MG/ML IJ SOLN
INTRAMUSCULAR | Status: DC | PRN
  Administered 2017-11-15 (×2): 2 mg via INTRAVENOUS

## 2017-11-15 MED ORDER — DIPHENHYDRAMINE HCL 50 MG/ML IJ SOLN: 12.5000 mg | Freq: Four times a day (QID) | INTRAMUSCULAR | Status: DC | PRN

## 2017-11-15 MED ORDER — ENOXAPARIN SODIUM 30 MG/0.3ML ~~LOC~~ SOLN
30.0000 mg | SUBCUTANEOUS | Status: DC
  Administered 2017-11-16 – 2017-11-23 (×8): 30 mg via SUBCUTANEOUS
  Filled 2017-11-15 (×8): qty 0.3

## 2017-11-15 MED ORDER — MANNITOL 20 % IV SOLN: INTRAVENOUS | Status: DC | PRN

## 2017-11-15 MED ORDER — KETAMINE HCL 10 MG/ML IJ SOLN
INTRAMUSCULAR | Status: DC | PRN
  Administered 2017-11-15: 50 mg via INTRAVENOUS

## 2017-11-15 MED ORDER — ACETAMINOPHEN 325 MG RE SUPP
325.0000 mg | RECTAL | Status: DC | PRN
  Administered 2017-11-17 (×2): 650 mg via RECTAL
  Filled 2017-11-15 (×2): qty 2

## 2017-11-15 MED ORDER — 0.9 % SODIUM CHLORIDE (POUR BTL) OPTIME
TOPICAL | Status: DC | PRN
  Administered 2017-11-15: 3000 mL

## 2017-11-15 MED ORDER — GUAIFENESIN-DM 100-10 MG/5ML PO SYRP
15.0000 mL | ORAL_SOLUTION | ORAL | Status: DC | PRN
Start: 2017-11-15 — End: 2017-11-26

## 2017-11-15 MED ORDER — LABETALOL HCL 5 MG/ML IV SOLN
10.0000 mg | INTRAVENOUS | Status: AC | PRN
  Administered 2017-11-15 – 2017-11-16 (×4): 10 mg via INTRAVENOUS
  Filled 2017-11-15 (×3): qty 4

## 2017-11-15 MED ORDER — SODIUM CHLORIDE 0.9 % IV SOLN
INTRAVENOUS | Status: DC | PRN
  Administered 2017-11-15: 500 mL

## 2017-11-15 MED ORDER — LACTATED RINGERS IV SOLN
INTRAVENOUS | Status: DC | PRN
  Administered 2017-11-15: 08:00:00 via INTRAVENOUS

## 2017-11-15 MED ORDER — HEPARIN SODIUM (PORCINE) 1000 UNIT/ML IJ SOLN
INTRAMUSCULAR | Status: AC
  Filled 2017-11-15: qty 1

## 2017-11-15 MED ORDER — FENTANYL CITRATE (PF) 250 MCG/5ML IJ SOLN
INTRAMUSCULAR | Status: AC
Start: 2017-11-15 — End: ?
  Filled 2017-11-15: qty 5

## 2017-11-15 MED ORDER — SUGAMMADEX SODIUM 200 MG/2ML IV SOLN
INTRAVENOUS | Status: AC
  Filled 2017-11-15: qty 2

## 2017-11-15 MED ORDER — ACETAMINOPHEN 325 MG PO TABS
325.0000 mg | ORAL_TABLET | ORAL | Status: DC | PRN
  Administered 2017-11-17: 650 mg via ORAL
  Filled 2017-11-15 (×2): qty 2

## 2017-11-15 MED ORDER — DEXAMETHASONE SODIUM PHOSPHATE 10 MG/ML IJ SOLN
INTRAMUSCULAR | Status: DC | PRN
  Administered 2017-11-15: 10 mg via INTRAVENOUS

## 2017-11-15 MED ORDER — HYDROMORPHONE HCL 2 MG/ML IJ SOLN
INTRAMUSCULAR | Status: AC
  Filled 2017-11-15: qty 1

## 2017-11-15 MED ORDER — SUGAMMADEX SODIUM 200 MG/2ML IV SOLN
INTRAVENOUS | Status: DC | PRN
  Administered 2017-11-15: 200 mg via INTRAVENOUS

## 2017-11-15 MED ORDER — SODIUM CHLORIDE 0.9% FLUSH: 9.0000 mL | INTRAVENOUS | Status: DC | PRN

## 2017-11-15 MED ORDER — HYDROMORPHONE 1 MG/ML IV SOLN
INTRAVENOUS | Status: DC
  Administered 2017-11-15: 0.5 mg via INTRAVENOUS
  Administered 2017-11-16: 1.8 mg via INTRAVENOUS
  Administered 2017-11-16: 22:00:00 via INTRAVENOUS
  Administered 2017-11-16: 5.3 mg via INTRAVENOUS
  Administered 2017-11-17: 3.6 mg via INTRAVENOUS
  Administered 2017-11-17: 2.7 mg via INTRAVENOUS
  Administered 2017-11-17: 8.6 mg via INTRAVENOUS
  Administered 2017-11-17: 3.3 mg via INTRAVENOUS
  Administered 2017-11-18: 1.2 mg via INTRAVENOUS
  Administered 2017-11-18: 10:00:00 via INTRAVENOUS
  Administered 2017-11-18: 3 mg via INTRAVENOUS
  Administered 2017-11-18: 1.8 mg via INTRAVENOUS
  Administered 2017-11-18: 8.8 mg via INTRAVENOUS
  Administered 2017-11-19: 3.3 mg via INTRAVENOUS
  Administered 2017-11-19: 21:00:00 via INTRAVENOUS
  Administered 2017-11-19: 0.9 mg via INTRAVENOUS
  Administered 2017-11-20: 2.1 mg via INTRAVENOUS
  Filled 2017-11-15 (×5): qty 25

## 2017-11-15 MED ORDER — ROCURONIUM BROMIDE 10 MG/ML (PF) SYRINGE
PREFILLED_SYRINGE | INTRAVENOUS | Status: AC
  Filled 2017-11-15: qty 5

## 2017-11-15 MED ORDER — FENTANYL CITRATE (PF) 250 MCG/5ML IJ SOLN
INTRAMUSCULAR | Status: AC
  Filled 2017-11-15: qty 5

## 2017-11-15 MED ORDER — ARTIFICIAL TEARS OPHTHALMIC OINT
TOPICAL_OINTMENT | OPHTHALMIC | Status: AC
  Filled 2017-11-15: qty 3.5

## 2017-11-15 MED ORDER — HYDRALAZINE HCL 20 MG/ML IJ SOLN
5.0000 mg | INTRAMUSCULAR | Status: AC | PRN
  Administered 2017-11-15 – 2017-11-16 (×2): 5 mg via INTRAVENOUS
  Filled 2017-11-15: qty 1

## 2017-11-15 MED ORDER — LACTATED RINGERS IV SOLN
INTRAVENOUS | Status: DC | PRN
  Administered 2017-11-15 (×3): via INTRAVENOUS

## 2017-11-15 MED ORDER — ARTIFICIAL TEARS OPHTHALMIC OINT
TOPICAL_OINTMENT | OPHTHALMIC | Status: DC | PRN
  Administered 2017-11-15: 1 via OPHTHALMIC

## 2017-11-15 MED ORDER — PANTOPRAZOLE SODIUM 40 MG PO TBEC
40.0000 mg | DELAYED_RELEASE_TABLET | Freq: Every day | ORAL | Status: DC
  Administered 2017-11-19 – 2017-11-26 (×8): 40 mg via ORAL
  Filled 2017-11-15 (×8): qty 1

## 2017-11-15 MED ORDER — IODIXANOL 320 MG/ML IV SOLN
INTRAVENOUS | Status: DC | PRN
  Administered 2017-11-15: 12 mL via INTRAVENOUS

## 2017-11-15 MED ORDER — ESMOLOL HCL 100 MG/10ML IV SOLN
INTRAVENOUS | Status: AC
  Filled 2017-11-15: qty 20

## 2017-11-15 MED ORDER — FAMOTIDINE IN NACL 20-0.9 MG/50ML-% IV SOLN
20.0000 mg | Freq: Two times a day (BID) | INTRAVENOUS | Status: DC
  Administered 2017-11-15 – 2017-11-21 (×12): 20 mg via INTRAVENOUS
  Filled 2017-11-15 (×12): qty 50

## 2017-11-15 MED ORDER — DOCUSATE SODIUM 100 MG PO CAPS
100.0000 mg | ORAL_CAPSULE | Freq: Every day | ORAL | Status: DC
  Administered 2017-11-19 – 2017-11-26 (×6): 100 mg via ORAL
  Filled 2017-11-15 (×7): qty 1

## 2017-11-15 MED ORDER — METOPROLOL TARTRATE 5 MG/5ML IV SOLN
2.0000 mg | INTRAVENOUS | Status: AC | PRN
  Administered 2017-11-16 – 2017-11-17 (×2): 5 mg via INTRAVENOUS
  Filled 2017-11-15 (×2): qty 5

## 2017-11-15 MED ORDER — PROTAMINE SULFATE 10 MG/ML IV SOLN
INTRAVENOUS | Status: DC | PRN
  Administered 2017-11-15: 20 mg via INTRAVENOUS
  Administered 2017-11-15 (×3): 10 mg via INTRAVENOUS

## 2017-11-15 MED ORDER — SODIUM CHLORIDE 0.9 % IV SOLN
INTRAVENOUS | Status: DC | PRN
  Administered 2017-11-15: 11:00:00 via INTRAVENOUS

## 2017-11-15 MED ORDER — KETAMINE HCL 10 MG/ML IJ SOLN
INTRAMUSCULAR | Status: AC
  Filled 2017-11-15: qty 1

## 2017-11-15 MED ORDER — SODIUM CHLORIDE 0.9 % IV SOLN
INTRAVENOUS | Status: AC
  Filled 2017-11-15: qty 1.2

## 2017-11-15 MED ORDER — ALUM & MAG HYDROXIDE-SIMETH 200-200-20 MG/5ML PO SUSP
30.0000 mL | ORAL | Status: DC | PRN
Start: 2017-11-15 — End: 2017-11-26
  Administered 2017-11-15 (×2): 30 mL via ORAL
  Filled 2017-11-15 (×2): qty 30

## 2017-11-15 MED ORDER — MIDAZOLAM HCL 2 MG/2ML IJ SOLN
INTRAMUSCULAR | Status: AC
Start: 2017-11-15 — End: ?
  Filled 2017-11-15: qty 2

## 2017-11-15 MED ORDER — MAGNESIUM SULFATE 2 GM/50ML IV SOLN: 2.0000 g | Freq: Once | INTRAVENOUS | Status: DC | PRN

## 2017-11-15 MED ORDER — FENTANYL CITRATE (PF) 250 MCG/5ML IJ SOLN
INTRAMUSCULAR | Status: DC | PRN
  Administered 2017-11-15 (×3): 50 ug via INTRAVENOUS
  Administered 2017-11-15: 300 ug via INTRAVENOUS
  Administered 2017-11-15 (×11): 50 ug via INTRAVENOUS

## 2017-11-15 MED ORDER — SODIUM CHLORIDE 0.9 % IV SOLN: 500.0000 mL | Freq: Once | INTRAVENOUS | Status: DC | PRN

## 2017-11-15 MED ORDER — PROPOFOL 10 MG/ML IV BOLUS
INTRAVENOUS | Status: AC
  Filled 2017-11-15: qty 20

## 2017-11-15 MED ORDER — HYDROMORPHONE HCL 1 MG/ML IJ SOLN: 0.5000 mg | INTRAMUSCULAR | Status: DC | PRN

## 2017-11-15 MED ORDER — HYDROMORPHONE HCL 2 MG/ML IJ SOLN
0.3000 mg | INTRAMUSCULAR | Status: DC | PRN
  Administered 2017-11-15 (×4): 0.5 mg via INTRAVENOUS

## 2017-11-15 MED ORDER — CEFAZOLIN SODIUM-DEXTROSE 2-4 GM/100ML-% IV SOLN
2.0000 g | Freq: Three times a day (TID) | INTRAVENOUS | Status: AC
  Administered 2017-11-15 – 2017-11-16 (×2): 2 g via INTRAVENOUS
  Filled 2017-11-15 (×2): qty 100

## 2017-11-15 MED ORDER — SODIUM CHLORIDE 0.9 % IJ SOLN
INTRAMUSCULAR | Status: AC
  Filled 2017-11-15: qty 20

## 2017-11-15 MED ORDER — ROCURONIUM BROMIDE 10 MG/ML (PF) SYRINGE
PREFILLED_SYRINGE | INTRAVENOUS | Status: DC | PRN
  Administered 2017-11-15: 10 mg via INTRAVENOUS
  Administered 2017-11-15: 30 mg via INTRAVENOUS
  Administered 2017-11-15: 60 mg via INTRAVENOUS
  Administered 2017-11-15: 40 mg via INTRAVENOUS
  Administered 2017-11-15: 5 mg via INTRAVENOUS
  Administered 2017-11-15 (×2): 30 mg via INTRAVENOUS

## 2017-11-15 MED ORDER — LACTATED RINGERS IV SOLN
INTRAVENOUS | Status: DC | PRN
  Administered 2017-11-15 (×2): via INTRAVENOUS

## 2017-11-15 MED ORDER — LABETALOL HCL 5 MG/ML IV SOLN
INTRAVENOUS | Status: AC
  Administered 2017-11-16: 10 mg via INTRAVENOUS
  Filled 2017-11-15: qty 4

## 2017-11-15 MED ORDER — MANNITOL 25 % IV SOLN
INTRAVENOUS | Status: DC | PRN
  Administered 2017-11-15: 25 g via INTRAVENOUS

## 2017-11-15 MED ORDER — HYDROMORPHONE HCL 1 MG/ML IJ SOLN
0.5000 mg | INTRAMUSCULAR | Status: DC | PRN
  Administered 2017-11-15: 1 mg via INTRAVENOUS
  Filled 2017-11-15: qty 1

## 2017-11-15 SURGICAL SUPPLY — 85 items
ADH SKN CLS APL DERMABOND .7 (GAUZE/BANDAGES/DRESSINGS) ×10
BANDAGE ACE 4X5 VEL STRL LF (GAUZE/BANDAGES/DRESSINGS) ×2 IMPLANT
BANDAGE ESMARK 6X9 LF (GAUZE/BANDAGES/DRESSINGS) IMPLANT
BNDG CMPR 9X6 STRL LF SNTH (GAUZE/BANDAGES/DRESSINGS)
BNDG ESMARK 6X9 LF (GAUZE/BANDAGES/DRESSINGS)
CANISTER SUCT 3000ML PPV (MISCELLANEOUS) ×3 IMPLANT
CANNULA VESSEL 3MM 2 BLNT TIP (CANNULA) ×6 IMPLANT
CLIP VESOCCLUDE MED 24/CT (CLIP) ×4 IMPLANT
CLIP VESOCCLUDE SM WIDE 24/CT (CLIP) ×4 IMPLANT
CUFF TOURNIQUET SINGLE 24IN (TOURNIQUET CUFF) ×1 IMPLANT
CUFF TOURNIQUET SINGLE 34IN LL (TOURNIQUET CUFF) IMPLANT
CUFF TOURNIQUET SINGLE 44IN (TOURNIQUET CUFF) IMPLANT
DERMABOND ADVANCED (GAUZE/BANDAGES/DRESSINGS) ×5
DERMABOND ADVANCED .7 DNX12 (GAUZE/BANDAGES/DRESSINGS) IMPLANT
DRAIN CHANNEL 15F RND FF W/TCR (WOUND CARE) IMPLANT
DRAPE HALF SHEET 40X57 (DRAPES) IMPLANT
DRAPE X-RAY CASS 24X20 (DRAPES) IMPLANT
ELECT BLADE 4.0 EZ CLEAN MEGAD (MISCELLANEOUS) ×3
ELECT BLADE 6.5 EXT (BLADE) IMPLANT
ELECT REM PT RETURN 9FT ADLT (ELECTROSURGICAL) ×3
ELECTRODE BLDE 4.0 EZ CLN MEGD (MISCELLANEOUS) ×2 IMPLANT
ELECTRODE REM PT RTRN 9FT ADLT (ELECTROSURGICAL) ×2 IMPLANT
EVACUATOR SILICONE 100CC (DRAIN) IMPLANT
FELT TEFLON 1X6 (MISCELLANEOUS) ×1 IMPLANT
GAUZE SPONGE 4X4 16PLY XRAY LF (GAUZE/BANDAGES/DRESSINGS) ×1 IMPLANT
GLOVE BIO SURGEON STRL SZ 6.5 (GLOVE) ×7 IMPLANT
GLOVE BIO SURGEON STRL SZ7 (GLOVE) ×1 IMPLANT
GLOVE BIO SURGEON STRL SZ7.5 (GLOVE) ×3 IMPLANT
GLOVE BIOGEL PI IND STRL 6.5 (GLOVE) IMPLANT
GLOVE BIOGEL PI IND STRL 7.0 (GLOVE) IMPLANT
GLOVE BIOGEL PI IND STRL 7.5 (GLOVE) IMPLANT
GLOVE BIOGEL PI IND STRL 8 (GLOVE) ×2 IMPLANT
GLOVE BIOGEL PI INDICATOR 6.5 (GLOVE) ×1
GLOVE BIOGEL PI INDICATOR 7.0 (GLOVE) ×2
GLOVE BIOGEL PI INDICATOR 7.5 (GLOVE) ×3
GLOVE BIOGEL PI INDICATOR 8 (GLOVE) ×1
GLOVE ECLIPSE 7.0 STRL STRAW (GLOVE) ×2 IMPLANT
GOWN STRL REUS W/ TWL LRG LVL3 (GOWN DISPOSABLE) ×6 IMPLANT
GOWN STRL REUS W/TWL LRG LVL3 (GOWN DISPOSABLE) ×18
GRAFT HEMASHIELD 14X7MM (Vascular Products) ×1 IMPLANT
INSERT FOGARTY 61MM (MISCELLANEOUS) ×3 IMPLANT
INSERT FOGARTY SM (MISCELLANEOUS) ×6 IMPLANT
KIT BASIN OR (CUSTOM PROCEDURE TRAY) ×3 IMPLANT
KIT TURNOVER KIT B (KITS) ×3 IMPLANT
MARKER GRAFT CORONARY BYPASS (MISCELLANEOUS) IMPLANT
NS IRRIG 1000ML POUR BTL (IV SOLUTION) ×6 IMPLANT
PACK AORTA (CUSTOM PROCEDURE TRAY) ×3 IMPLANT
PACK PERIPHERAL VASCULAR (CUSTOM PROCEDURE TRAY) ×2 IMPLANT
PAD ARMBOARD 7.5X6 YLW CONV (MISCELLANEOUS) ×6 IMPLANT
PENCIL BUTTON HOLSTER BLD 10FT (ELECTRODE) ×1 IMPLANT
RETAINER VISCERA MED (MISCELLANEOUS) ×3 IMPLANT
SET COLLECT BLD 21X3/4 12 (NEEDLE) ×1 IMPLANT
SPONGE SURGIFOAM ABS GEL 100 (HEMOSTASIS) IMPLANT
STOPCOCK 4 WAY LG BORE MALE ST (IV SETS) ×1 IMPLANT
SUT ETHIBOND 5 LR DA (SUTURE) IMPLANT
SUT ETHILON 3 0 PS 1 (SUTURE) IMPLANT
SUT PDS AB 1 TP1 54 (SUTURE) ×6 IMPLANT
SUT PROLENE 2 0 MH 48 (SUTURE) IMPLANT
SUT PROLENE 3 0 SH 48 (SUTURE) ×2 IMPLANT
SUT PROLENE 3 0 SH1 36 (SUTURE) IMPLANT
SUT PROLENE 5 0 C 1 24 (SUTURE) ×6 IMPLANT
SUT PROLENE 5 0 C 1 36 (SUTURE) IMPLANT
SUT PROLENE 6 0 BV (SUTURE) ×7 IMPLANT
SUT SILK 2 0 (SUTURE) ×6
SUT SILK 2 0 PERMA HAND 18 BK (SUTURE) ×3 IMPLANT
SUT SILK 2 0 SH (SUTURE) ×3 IMPLANT
SUT SILK 2 0 SH CR/8 (SUTURE) ×3 IMPLANT
SUT SILK 2 0 TIES 17X18 (SUTURE) ×3
SUT SILK 2-0 18XBRD TIE 12 (SUTURE) ×2 IMPLANT
SUT SILK 2-0 18XBRD TIE BLK (SUTURE) ×2 IMPLANT
SUT SILK 3 0 (SUTURE) ×9
SUT SILK 3 0 TIES 17X18 (SUTURE) ×3
SUT SILK 3-0 18XBRD TIE 12 (SUTURE) ×2 IMPLANT
SUT SILK 3-0 18XBRD TIE BLK (SUTURE) ×2 IMPLANT
SUT VIC AB 2-0 CTB1 (SUTURE) ×8 IMPLANT
SUT VIC AB 3-0 SH 27 (SUTURE) ×21
SUT VIC AB 3-0 SH 27X BRD (SUTURE) ×4 IMPLANT
SUT VICRYL 4-0 PS2 18IN ABS (SUTURE) ×9 IMPLANT
SYR 30ML LL (SYRINGE) ×1 IMPLANT
TOWEL BLUE STERILE X RAY DET (MISCELLANEOUS) ×6 IMPLANT
TOWEL GREEN STERILE (TOWEL DISPOSABLE) ×3 IMPLANT
TRAY FOLEY MTR SLVR 16FR STAT (SET/KITS/TRAYS/PACK) ×3 IMPLANT
TUBING EXTENTION W/L.L. (IV SETS) ×1 IMPLANT
UNDERPAD 30X30 (UNDERPADS AND DIAPERS) ×3 IMPLANT
WATER STERILE IRR 1000ML POUR (IV SOLUTION) ×6 IMPLANT

## 2017-11-15 NOTE — Progress Notes (Signed)
This is a no charge note.  Patient seen briefly in PACU post-operatively.  No complications listed in Op note, procedure appears to have gone well.  PACU note indicates good bilateral DP and peroneal dopplers. Dr. Scot Dock will assume care in the CVICU tonight.  If we can be of further assistance during the patient's hospital stay, please do not hesitate to contact me, Suann Larry Danford via cell phone listed in [place] if before next Monday, 6/10 or afterwards the [place] flow nurse.

## 2017-11-15 NOTE — Interval H&P Note (Signed)
History and Physical Interval Note:  11/15/2017 8:51 AM  Tony [name]  has presented today for surgery, with the diagnosis of LOWER LIMB ISCHEMIA  The various methods of treatment have been discussed with the patient and family. After consideration of risks, benefits and other options for treatment, the patient has consented to  Procedure(s): AORTA BIFEMORAL BYPASS GRAFT (N/A) BYPASS GRAFT FEMORAL-POPLITEAL ARTERY RIGHT (Right) as a surgical intervention .  The patient's history has been reviewed, patient examined, no change in status, stable for surgery.  I have reviewed the patient's chart and labs.  Questions were answered to the patient's satisfaction.     Adele Barthel

## 2017-11-15 NOTE — Anesthesia Procedure Notes (Signed)
Central Venous Catheter Insertion Performed by: Roderic Palau, MD, anesthesiologist Start/End6/11/2017 8:14 AM, 11/15/2017 8:24 AM Patient location: Pre-op. Preanesthetic checklist: patient identified, IV checked, site marked, risks and benefits discussed, surgical consent, monitors and equipment checked, pre-op evaluation, timeout performed and anesthesia consent Position: Trendelenburg Lidocaine 1% used for infiltration and patient sedated Hand hygiene performed , maximum sterile barriers used  and Seldinger technique used Catheter size: 9 Fr Total catheter length 10. Central line was placed.MAC introducer Swan type:thermodilution Procedure performed using ultrasound guided technique. Ultrasound Notes:anatomy identified, needle tip was noted to be adjacent to the nerve/plexus identified, no ultrasound evidence of intravascular and/or intraneural injection and image(s) printed for medical record Attempts: 1 Following insertion, line sutured, dressing applied and Biopatch. Post procedure assessment: blood return through all ports, free fluid flow and no air  Patient tolerated the procedure well with no immediate complications.

## 2017-11-15 NOTE — Anesthesia Postprocedure Evaluation (Signed)
Anesthesia Post Note  Patient: FADIL MACMASTER  Procedure(s) Performed: AORTA BIFEMORAL BYPASS GRAFT (Bilateral Abdomen) Right FEMORAL-Above knee POPLITEAL ARTERY Bypass Graft using nonreversed saphenous vein  (Right Leg Upper) INTRA OPERATIVE ARTERIOGRAM (Right Leg Upper)     Patient location during evaluation: PACU Anesthesia Type: General Level of consciousness: awake and alert Pain management: pain level controlled Vital Signs Assessment: post-procedure vital signs reviewed and stable Respiratory status: spontaneous breathing, nonlabored ventilation, respiratory function stable and patient connected to nasal cannula oxygen Cardiovascular status: blood pressure returned to baseline and stable Postop Assessment: no apparent nausea or vomiting Anesthetic complications: no    Last Vitals:  Vitals:   11/15/17 1752 11/15/17 1812  BP: (!) 182/93   Pulse:    Resp:    Temp:  (!) 36.4 C  SpO2:      Last Pain:  Vitals:   11/15/17 0728  TempSrc:   PainSc: 0-No pain                 Lexx Monte,W. EDMOND

## 2017-11-15 NOTE — Progress Notes (Signed)
  Day of Surgery Note    Subjective:  C/o pain    Vitals:   11/14/17 2152 11/15/17 0423  BP:  (!) 145/87  Pulse:  67  Resp:  17  Temp: 100 F (37.8 C) 98.2 F (36.8 C)  SpO2:  99%    Incisions:   Laparotomy, bilateral groin incisions are clean and dry Extremities:  + doppler signals bilateral PT/peroneal signals Cardiac:  regular Lungs:  Non labored    Assessment/Plan:  This is a 52 y.o. male who is s/p  Aortobifemoral bypass grafting and left femoral to popliteal bypass grafting  -pt with + doppler signals bilateral PT/peroneal -incisions look good-no hematoma present -hypertensive-has received hydralazine-has IV metoprolol ordered 5mg  q6h - hold for systolic < 671 and HR < 60 -pain not adequately controlled at this time-may need PCA when he is more awake  -to CVICU later today   Leontine Locket, PA-C 11/15/2017 4:26 PM [phone]

## 2017-11-15 NOTE — OR Nursing (Signed)
Xray results negative from postop count. Read by Dr Dorann Lodge

## 2017-11-15 NOTE — Progress Notes (Addendum)
Telephone call placed to Dr. Bridgett Larsson for post-op pain management.  Patient continues with 10/10 abdominal "burning" pain to abdomen after IV Dilaudid PRN dose given at 1850.  New orders received for Dilaudid PCA.

## 2017-11-15 NOTE — Discharge Instructions (Signed)
 [facility]  Discharge Instructions   Open Aortic Surgery  Please refer to the following instructions for your post-procedure care. Your surgeon or Physician Assistant will discuss any changes with you.  Activity  Avoid lifting more than eight pounds (a gallon of milk) until after your first post-operative visit. You are encouraged to walk as much as you can. You can slowly return to normal activities but must avoid strenuous activity and heavy lifting until your doctor tells you it's okay. Heavy lifting can hurt the incision and cause a hernia. Avoid activities such as vacuuming or swinging a golf club. It is normal to feel tired for several weeks after your surgery. Do not drive until your doctor gives the okay and you are no longer taking prescription pain medications. It is also normal to have difficulty with sleep habits, eating and bowl movements after surgery. These will go away with time.  Bathing/Showering  Shower daily after you go home. Do not soak in a bathtub, hot tub, or swim until the incision heals.  Incision Care  Shower every day. Clean your incision with mild soap and water. Pat the area dry with a clean towel. You do not need a bandage unless otherwise instructed. Do not apply any ointments or creams to your incision. You may have skin glue on your incision. Do not peel it off. It will come off on its own in about one week. If you have staples or sutures along your incision, they will be removed at your post op appointment.  If you have groin incisions, wash the groin wounds with soap and water daily and pat dry. (No tub bath-only shower)  Then put a dry gauze or washcloth in the groin to keep this area dry to help prevent wound infection.  Do this daily and as needed.  Do not use Vaseline or neosporin on your incisions.  Only use soap and water on your incisions and then protect and keep dry.  Diet  Resume your normal diet. There are no  special food restriction following this procedure. A low fat/low cholesterol diet is recommended for all patients with vascular disease. After your aortic surgery, it's normal to feel full faster than usual and to not feel as hungry as you normally would. You will probably lose weight initially following your surgery. It's best to eat small, frequent meals over the course of the day. Call the office if you find that you are unable to eat even small meals.   In order to heal from your surgery, it is CRITICAL to get adequate nutrition. Your body requires vitamins, minerals, and protein. Vegetables are the best source of vitamins and minerals. If you have pain, you may take over-the-counter pain reliever such as acetaminophen (Tylenol). If you were prescribed a stronger pain medication, please be aware these medication can cause nausea and constipation. Prevent nausea by taking the medication with a snack or meal. Avoid constipation by drinking plenty of fluids and eating foods with a high amount of fiber, such as fruits, vegetables and grains. Take 100mg of the over-the-counter stool softener Colace twice a day as needed to help with constipation. A laxative, such as Milk of Magnesia, may be recommended for you at this time. Do not take a laxative unless your surgeon or P.A. tells you it's OK.  Do not take Tylenol if you are taking stronger pain medications (such as Percocet).  Follow Up  Our office will schedule a follow up   appointment 2-3 weeks after discharge.  Please call us immediately for any of the following conditions    .     Severe or worsening pain in your legs or feet or in your abdomen back or chest. Increased pain, redness drainage (pus) from your incision site. Increased abdominal pain, bloating, nausea, vomiting, or persistent diarrhea. Fever of 101 degrees or higher. Swelling in your leg (s).  Reduce your risk of vascular disease  Stop smoking. If you would like help, call  [facility] at 1-800-QUIT-NOW (1-800-784-8669) or [facility] at 336-586-4000. Manage your cholesterol Maintain a desired weight Control your diabetes Keep your blood pressure down  If you have any questions please call the office at [phone].   

## 2017-11-15 NOTE — Anesthesia Procedure Notes (Addendum)
Arterial Line Insertion Start/End6/11/2017 8:10 AM, 11/15/2017 8:25 AM Performed by: Harden Mo, CRNA, CRNA  Patient location: Pre-op. Preanesthetic checklist: patient identified, IV checked, site marked, risks and benefits discussed, surgical consent, monitors and equipment checked, pre-op evaluation and anesthesia consent Lidocaine 1% used for infiltration and patient sedated Left, radial was placed Catheter size: 20 G Hand hygiene performed  and maximum sterile barriers used   Attempts: 1 Procedure performed without using ultrasound guided technique. Ultrasound Notes:anatomy identified, needle tip was noted to be adjacent to the nerve/plexus identified and no ultrasound evidence of intravascular and/or intraneural injection Following insertion, dressing applied and Biopatch. Post procedure assessment: normal  Patient tolerated the procedure well with no immediate complications.

## 2017-11-15 NOTE — Op Note (Signed)
NAME: Tony [name]    MRN: [id number] DOB: July 20, 1965    DATE OF OPERATION: 11/15/2017  PREOP DIAGNOSIS:    Multilevel arterial occlusive disease with rest pain  POSTOP DIAGNOSIS:    Same  PROCEDURE:    1. Aortobifemoral bypass with 14 x 7 mm Dacron graft 2. Right femoral to above-knee popliteal artery bypass with non-reversed translocated saphenous vein graft 3. Intraoperative arteriogram  CO-SURGEON's: Judeth Cornfield. Scot Dock, MD, Adele Barthel, MD  ASSIST: Gerri Lins, PA  ANESTHESIA: General 300 cc.  EBL: 300 cc.  INDICATIONS:    Tony [name] is a 52 y.o. male who presented with disabling pain in his right foot and was found to have multilevel arterial occlusive disease.  He also had significant hip thigh and calf claudication on the left.  He underwent CT angiography which showed multilevel arterial occlusive disease.  He presents for aortofemoral bypass grafting and right femoropopliteal bypass.  FINDINGS:   Brisk posterior tibial signals at the completion of the procedure.  He has known anterior tibial artery occlusions.  TECHNIQUE:   The patient was taken to the operating room and received a general anesthetic.  The abdomen and both groins were prepped and draped in usual sterile fashion.  Given the complexity of the procedure and the length of the case a 2 team approach was used.  Dr. Bridgett Larsson exposed the left common femoral artery and also did the left femoral anastomosis and this is dictated separately.  On the right side, a longitudinal incision was made in the right groin and through this incision the common femoral, superficial femoral, deep femoral arteries were dissected free.  Also dissected for the saphenofemoral junction.  The retroperitoneal tunnel was started on the right.  The abdomen was entered through a midline incision.  The NG tube was checked for position and was in good position.  Expiratory laparotomy revealed no other significant  intra-abdominal pathology.  The transverse colon was reflected superiorly and the small bowel reflected to the right.  Direct peritoneal tissue was divided just to the right of the midline allowing exposure of the infrarenal aorta up to the level of the left renal vein.  This was dissected free circumferentially and several lumbars were clipped.  The retroperitoneal tunnels were created bilaterally by passing an umbilical tape.  Patient was then heparinized and soup received 25 g of mannitol.  I selected a 14 mm x 7 mm Dacron graft.  This was cut to the appropriate length.  The infrarenal aorta was clamped just below the renals.  I then clamped distally just above the IMA.  The aorta was divided transversely.  A segment of the artery was excised to allow the graft to lie flat.  Distally the aorta was oversewn with a 3-0 Prolene suture.  Clamp was released and there was good hemostasis.  The proximal anastomosis was sewn using a felt cuff using continuous 3-0 Prolene suture.  After the anastomosis was completed the anastomosis was tested and was hemostatic.  The graft was then flushed and then the aorta reclamped.  Each of the respective limbs was brought down for anastomosis to the femoral arteries.  On the right side, the common femoral, superficial femoral, deep femoral arteries were controlled.  A longitudinal arteriotomy was made in the common femoral artery.  The right limb of the graft was cut to the appropriate length, spatulated and sewn into side to the common femoral artery using continuous 5-0 Prolene suture.  Prior to completing  this anastomosis the artery was backbled and flushed appropriately and the anastomosis completed.  Flow was reestablished to the right leg.  Using 3 incision along the medial aspect of the right leg the great saphenous vein was harvested to the level of the knee where it gave off a large branching became smaller.  Branches were divided between clips and 3-0 silk ties.   Through the distal incision the dissection was carried down through the vastus medialis and the sartorius muscle to expose the above-knee popliteal artery which had moderate disease.  However they had a patent lumen.  A tunnel was created between this incision and the groin incision the patient was re-heparinized.  Attention was first turned to the proximal anastomosis.  The left external iliac artery, right limb of the aortobifemoral graft, superficial femoral and deep femoral arteries were controlled.  A longitudinal arteriotomy was made in the graft at the hood of the distal anastomosis.  The vein was spatulated and sewn in a non-reversed fashion end-to-side to the graft using continuous 6-0 Prolene suture.  Prior to completing this anastomosis the arteries were backbled and flushed appropriately and the anastomosis completed.  I then used a retrograde Mills valvulotome to lyse the valves in the right great saphenous vein.  It was then marked to prevent twisting.  There was excellent flow through the graft.  He did get slightly small and one area to about 3.5 mm.  It was brought down for anastomosis to the above-knee popliteal artery.  Tourniquet was placed on the thigh.  The leg was exsanguinated with an Esmarch bandage.  The tourniquet was inflated to 300 mmHg.  Under tourniquet control a longitudinal arteriotomy was made in the above-knee popliteal artery.  There was moderate disease here but there was a patent lumen.  The vein was spatulated and sewn end-to-side to the above-knee popliteal artery using continuous 6-0 Prolene suture.  Prior to completing this anastomosis the tourniquet was released.  The artery was backbled and flushed appropriately and the anastomosis completed.  Flow was reestablished in the right leg at the completion.  Completion arteriogram was obtained by cannulating the proximal graft.  The graft was patent with the dominant runoff being the posterior tibial artery.  Hemostasis  was obtained and the vein harvest incisions and then each of these was closed with 2 deep layers of 3-0 Vicryl and the skin closed with 4-0 Vicryl.  The incision for exposure to the above-knee popliteal artery was closed with 2 deep layers of 3-0 Vicryl and the skin closed with 4-0 Vicryl.  In the left groin after hemostasis was obtained the femoral sheath was closed with running 2-0 Vicryl.  The subcutaneous layer was closed with 2-0 Vicryl and then a second layer of 3-0 Vicryl was performed.  The skin was closed with 4-0 Vicryl.  Likewise in the right groin hemostasis was obtained.  The femoral sheath was closed with running 2-0 Vicryl.  The subcutaneous layer was closed with a deep layer of 2-0 Vicryl in a subcutaneous layer of 3-0 Vicryl.  The skin was closed with 4-0 Vicryl.  Attention was then turned to the abdomen.  The abdominal contents were returned to their normal position.  Hemostasis was obtained.  The abdominal fascia was closed with 2 running #1 PDS sutures.  The subcutaneous layer was closed with 3-0 Vicryl and the skin closed with 4-0 Vicryl.  Dermabond was applied.  The patient tolerated procedure well was transferred to the recovery room in stable condition.  Deitra Mayo, MD, FACS [facility]  DATE OF DICTATION:   11/15/2017

## 2017-11-15 NOTE — Op Note (Signed)
OPERATIVE NOTE   PROCEDURE: 1. Left aortobifemoral bypass anastomosis  PRE-OPERATIVE DIAGNOSIS: atherosclerosis with rest pain  POST-OPERATIVE DIAGNOSIS: same as above   CO-SURGEONS: Gae Gallop, MD; Adele Barthel, MD  ASSISTANT(S): Laurence Slate, [facility]   ANESTHESIA: general  ESTIMATED BLOOD LOSS: 300 cc  FINDING(S): 1.  Posterior plaque in common femoral artery soft anterior wall 2.  Small profunda femoral artery   SPECIMEN(S):  none  INDICATIONS:   TION TSE is a 52 y.o. male who presents with rest pain and multilevel atherosclerosis.  Dr. Scot Dock offered the patient an aortobifemoral bypass with a right femoropopliteal bypass.  The patient is aware the risks of aortic surgery include but are not limited to: bleeding, need for transfusion, infection, death, stroke, paralysis, wound complications, bowel injuries, impotence, bowel ischemia, extended ventilation and future ventral hernias.  Overall, Dr. Scot Dock cited a mortality rate of 5-10% and morbidity rate of 30%. The risk, benefits, and alternative for bypass operations were discussed with the patient.  The patient is aware the risks include but are not limited to: bleeding, infection, myocardial infarction, stroke, limb loss, nerve damage, limb edema, need for additional procedures in the future, wound complications, and inability to complete the bypass. The patient is aware of these risks and agreed to proceed.   DESCRIPTION: After obtaining full informed written consent, the patient was brought back to the operating room and placed supine upon the operating table.  The patient received IV antibiotics prior to induction.  A procedure time out was completed and the correct surgical site was verified.  After obtaining adequate anesthesia, the patient was prepped and draped in the standard fashion for: aortobifemoral bypass and right femoropopliteal bypass and possible left leg vein harvest.  A two-surgeon technique was  utilized due to the higher mortality and morbidity associated with long extended aortic procedures with concomitant bypasses.  The intention and result was to lower the operative time and subsequently reduced both risks.  I turned my attention to the left groin after identifying the non-pulsatile right common femoral artery with Sonosite.  I marked the location on the skin and then made a longitudinal incision.  I dissected down to the common femoral artery.  The subcutaneous tissue was abnormally dense with extensive inflammation more often associated with a prior surgical site.  I was able to dissected out the distal external iliac artery, common femoral artery, profunda femoral artery and superficial femoral artery.   I identified the crossing vein under the inguinal ligament and double clips each side prior to transection.  I bluntly created the start of the retroperitoneal tunnel, immediate superficial to the iliac artery on this side, which did not have a pulse.  Vessel loops were applied to all arteries.  Dr. Scot Dock completed the same exposure on the right side.  See his notes for details.  He then exposed the aorta and sewed in the main body of the aortobifemoral.  She his notes for details.  I tied the left aortobifemoral limb to the umbilical tape that had been route through the retroperitoneal tunnel, superficial to the iliac artery.  Taking care to maintain orientation, I pulled the left aortobifemoral limb through the tunnel.  The prior Fogarty clamp on both limbs was converted two separate Fogarty clamps which were independently applied to each limb.  I reset my exposure of the left common femoral artery.  The distal external iliac artery was clamped and then the superficial femoral artery and profunda femoral artery were both  placed under tension.  I made an arteriotomy in the distal common femoral artery and extended it distally on top the femoral bifurcation.  I then pulled the left  aortobifemoral limb to tension and determine the location for the anastomosis, based on the arteriotomy.  I then spatulated the left aortobifemoral limb.  I extended the arteriotomy proximally to meet the geometry of the graft.  I sewed the left aortobifemoral limb in an end-to-side configuration with a running stitch of 5-0 Prolene.  Prior to completion, I backbled all arteries: no clot.  I also bled the aortobifemoral limb: no clot, pulsatile bleeding.  I pulled up on the suture line and finished the anastomosis in the usual fashion.  I packed the left groin with Avitene.  No bleeding was present after a few minutes.  This groin was repaired with a double layer of 2-0 Vicryl immediately superficial to the graft, a double layer of 3-0 Vicryl in the subcutaneous tissue, and finally a running subcuticular stitch of 4-0 Monocryl.  The skin cleaned, dried, and reinforced with Dermabond.   COMPLICATIONS: none  CONDITION: stable   Adele Barthel, MD, [facility] [place] Office: [phone] Pager: [phone]  11/15/2017, 7:34 PM

## 2017-11-15 NOTE — Transfer of Care (Signed)
Immediate Anesthesia Transfer of Care Note  Patient: Tony [name]  Procedure(s) Performed: AORTA BIFEMORAL BYPASS GRAFT (Bilateral Abdomen) Right FEMORAL-Above knee POPLITEAL ARTERY Bypass Graft using nonreversed saphenous vein  (Right Leg Upper) INTRA OPERATIVE ARTERIOGRAM (Right Leg Upper)  Patient Location: PACU  Anesthesia Type:General  Level of Consciousness: awake, alert  and oriented  Airway & Oxygen Therapy: Patient Spontanous Breathing and Patient connected to nasal cannula oxygen  Post-op Assessment: Report given to RN, Post -op Vital signs reviewed and stable and Patient moving all extremities X 4  Post vital signs: Reviewed and stable  Last Vitals:  Vitals Value Taken Time  BP 200/98 11/15/2017  4:17 PM  Temp    Pulse 93 11/15/2017  4:22 PM  Resp 19 11/15/2017  4:22 PM  SpO2 94 % 11/15/2017  4:22 PM  Vitals shown include unvalidated device data.  Last Pain:  Vitals:   11/15/17 0728  TempSrc:   PainSc: 0-No pain         Complications: No apparent anesthesia complications

## 2017-11-15 NOTE — Anesthesia Procedure Notes (Signed)
Procedure Name: Intubation Date/Time: 11/15/2017 9:19 AM Performed by: Harden Mo, CRNA Pre-anesthesia Checklist: Patient identified, Emergency Drugs available, Suction available and Patient being monitored Patient Re-evaluated:Patient Re-evaluated prior to induction Oxygen Delivery Method: Circle System Utilized Preoxygenation: Pre-oxygenation with 100% oxygen Induction Type: IV induction Ventilation: Mask ventilation without difficulty and Oral airway inserted - appropriate to patient size Laryngoscope Size: Sabra Heck and 2 Grade View: Grade I Tube type: Subglottic suction tube Tube size: 7.5 mm Number of attempts: 1 Airway Equipment and Method: Stylet and Oral airway Placement Confirmation: ETT inserted through vocal cords under direct vision,  positive ETCO2 and breath sounds checked- equal and bilateral Secured at: 24 cm Tube secured with: Tape Dental Injury: Teeth and Oropharynx as per pre-operative assessment

## 2017-11-16 ENCOUNTER — Inpatient Hospital Stay (HOSPITAL_COMMUNITY): Payer: BLUE CROSS/BLUE SHIELD

## 2017-11-16 ENCOUNTER — Encounter (HOSPITAL_COMMUNITY): Payer: Self-pay | Admitting: Vascular Surgery

## 2017-11-16 ENCOUNTER — Telehealth: Payer: Self-pay | Admitting: Vascular Surgery

## 2017-11-16 DIAGNOSIS — I739 Peripheral vascular disease, unspecified: Secondary | ICD-10-CM

## 2017-11-16 DIAGNOSIS — Z9889 Other specified postprocedural states: Secondary | ICD-10-CM

## 2017-11-16 LAB — TYPE AND SCREEN
ABO/RH(D): O POS
ANTIBODY SCREEN: NEGATIVE
UNIT DIVISION: 0
Unit division: 0
Unit division: 0
Unit division: 0

## 2017-11-16 LAB — COMPREHENSIVE METABOLIC PANEL
ALBUMIN: 3 g/dL — AB (ref 3.5–5.0)
ALT: 21 U/L (ref 17–63)
AST: 26 U/L (ref 15–41)
Alkaline Phosphatase: 78 U/L (ref 38–126)
Anion gap: 7 (ref 5–15)
BILIRUBIN TOTAL: 0.6 mg/dL (ref 0.3–1.2)
BUN: 7 mg/dL (ref 6–20)
CHLORIDE: 105 mmol/L (ref 101–111)
CO2: 27 mmol/L (ref 22–32)
CREATININE: 0.87 mg/dL (ref 0.61–1.24)
Calcium: 8.4 mg/dL — ABNORMAL LOW (ref 8.9–10.3)
GFR calc Af Amer: >60 mL/min (ref >60)
GFR calc non Af Amer: >60 mL/min (ref >60)
GLUCOSE: 129 mg/dL — AB (ref 65–99)
POTASSIUM: 3.4 mmol/L — AB (ref 3.5–5.1)
Sodium: 139 mmol/L (ref 135–145)
TOTAL PROTEIN: 5.4 g/dL — AB (ref 6.5–8.1)

## 2017-11-16 LAB — BPAM RBC
BLOOD PRODUCT EXPIRATION DATE: 201907052359
Blood Product Expiration Date: 201906122359
Blood Product Expiration Date: 201907052359
Blood Product Expiration Date: 201907052359
ISSUE DATE / TIME: 201906060947
ISSUE DATE / TIME: 201906060947
ISSUE DATE / TIME: 201906060947
ISSUE DATE / TIME: 201906060947
UNIT TYPE AND RH: 5100
UNIT TYPE AND RH: 5100
UNIT TYPE AND RH: 5100
Unit Type and Rh: 5100

## 2017-11-16 LAB — CBC
HEMATOCRIT: 35.8 % — AB (ref 39.0–52.0)
Hemoglobin: 11.6 g/dL — ABNORMAL LOW (ref 13.0–17.0)
MCH: 29.5 pg (ref 26.0–34.0)
MCHC: 32.4 g/dL (ref 30.0–36.0)
MCV: 91.1 fL (ref 78.0–100.0)
Platelets: 225 10*3/uL (ref 150–400)
RBC: 3.93 MIL/uL — ABNORMAL LOW (ref 4.22–5.81)
RDW: 13.9 % (ref 11.5–15.5)
WBC: 12.8 10*3/uL — ABNORMAL HIGH (ref 4.0–10.5)

## 2017-11-16 LAB — BLOOD GAS, ARTERIAL
Acid-Base Excess: 0.3 mmol/L (ref 0.0–2.0)
BICARBONATE: 24.1 mmol/L (ref 20.0–28.0)
O2 Content: 4 L/min
O2 Saturation: 92.3 %
PATIENT TEMPERATURE: 98.8
PCO2 ART: 37.5 mmHg (ref 32.0–48.0)
PO2 ART: 64 mmHg — AB (ref 83.0–108.0)
pH, Arterial: 7.425 (ref 7.350–7.450)

## 2017-11-16 LAB — MAGNESIUM: MAGNESIUM: 1.9 mg/dL (ref 1.7–2.4)

## 2017-11-16 LAB — AMYLASE: Amylase: 30 U/L (ref 28–100)

## 2017-11-16 MED ORDER — LABETALOL HCL 5 MG/ML IV SOLN: 10.0000 mg | INTRAVENOUS | Status: DC | PRN

## 2017-11-16 MED ORDER — LORAZEPAM 2 MG/ML IJ SOLN
1.0000 mg | Freq: Two times a day (BID) | INTRAMUSCULAR | Status: DC | PRN
  Administered 2017-11-16 – 2017-11-25 (×8): 1 mg via INTRAVENOUS
  Filled 2017-11-16 (×9): qty 1

## 2017-11-16 MED ORDER — HYDRALAZINE HCL 20 MG/ML IJ SOLN: 5.0000 mg | INTRAMUSCULAR | Status: DC | PRN

## 2017-11-16 MED ORDER — LORAZEPAM 2 MG/ML IJ SOLN
1.0000 mg | Freq: Once | INTRAMUSCULAR | Status: AC
  Administered 2017-11-16: 1 mg via INTRAVENOUS
  Filled 2017-11-16: qty 1

## 2017-11-16 NOTE — Discharge Summary (Signed)
[facility] Discharge Summary   Patient ID:  Tony [name] MRN: [id number] DOB/AGE: 1965-08-23 52 y.o.  Admit date: 11/10/2017 Discharge date: 11/11/2017 Date of Surgery: * No surgery found * Surgeon: * Surgery not found *  Admission Diagnosis: Arterial occlusion, lower extremity ([place]) [I70.209] PVD (peripheral vascular disease) ([place]) [I73.9]  Discharge Diagnoses:  Arterial occlusion, lower extremity ([place]) [I70.209] PVD (peripheral vascular disease) ([place]) [I73.9]  Secondary Diagnoses: Past Medical History:  Diagnosis Date  . Anxiety   . Arterial occlusive disease    multilevel arterial occlusive disease/notes 11/13/2017  . Arthritis    "left knee" (11/13/2017)  . Chronic back pain   . Chronic lower back pain   . Depression   . Gastric ulcer due to Helicobacter pylori 1025  . GERD (gastroesophageal reflux disease)   . High cholesterol   . Hypertension   . IBS (irritable bowel syndrome)   . Mesenteric adenitis 2011   presumed/H&P  . Migraines    "use to suffer migraines years ago" (11/13/2017)  . Peripheral vascular disease ([place])   . Pneumonia 2011  . Ulcer       Discharged Condition: stable  HPI: 52 y/o male with history of right foot pain progressing over the last 3 days.  Prior to these events I do not get any clear-cut history of claudication or rest pain.  CT angiogram which shows on the right side, which is the symptomatic side, he has moderate disease of the common and external iliac artery with occlusion of the superficial femoral artery and reconstitution of the popliteal artery with two-vessel runoff via the posterior tibial and peroneal arteries.  This appears to be chronic.  On the right side he has an occluded common and external iliac artery with reconstitution of the common femoral artery and a superficial femoral artery occlusion.  Likewise he has two-vessel runoff on the left.  He has no evidence of an acute arterial occlusion.  He will be  scheduled for aortofemoral bypass with right fem above knee pop bypass.  He is being discharged and will be readmitted for his surgery on 11/15/2017.      Significant Diagnostic Studies: CBC Lab Results  Component Value Date   WBC 12.8 (H) 11/16/2017   HGB 11.6 (L) 11/16/2017   HCT 35.8 (L) 11/16/2017   MCV 91.1 11/16/2017   PLT 225 11/16/2017    BMET    Component Value Date/Time   NA 139 11/16/2017 0415   K 3.4 (L) 11/16/2017 0415   CL 105 11/16/2017 0415   CO2 27 11/16/2017 0415   GLUCOSE 129 (H) 11/16/2017 0415   BUN 7 11/16/2017 0415   CREATININE 0.87 11/16/2017 0415   CALCIUM 8.4 (L) 11/16/2017 0415   GFRNONAA >60 11/16/2017 0415   GFRAA >60 11/16/2017 0415   COAG Lab Results  Component Value Date   INR 1.01 11/15/2017   INR 1.07 11/11/2017     Disposition:  Discharge to :Home Discharge Instructions    Call MD for:  redness, tenderness, or signs of infection (pain, swelling, bleeding, redness, odor or green/yellow discharge around incision site)   Complete by:  As directed    Call MD for:  severe or increased pain, loss or decreased feeling  in affected limb(s)   Complete by:  As directed    Call MD for:  temperature >100.5   Complete by:  As directed    Resume previous diet   Complete by:  As directed  Allergies as of 11/11/2017      Reactions   Morphine And Related Other (See Comments)   hallucinations      Medication List    TAKE these medications   atorvastatin 10 MG tablet Commonly known as:  LIPITOR Take 10 mg by mouth daily.   gabapentin 100 MG capsule Commonly known as:  NEURONTIN Take 2 capsules (200 mg total) by mouth 2 (two) times daily.   hydrochlorothiazide 25 MG tablet Commonly known as:  HYDRODIURIL Take 25 mg by mouth daily.   metoprolol succinate 100 MG 24 hr tablet Commonly known as:  TOPROL-XL Take 100 mg by mouth at bedtime. Take with or immediately following a meal.   traMADol 50 MG tablet Commonly known as:   ULTRAM Take 1 tablet (50 mg total) by mouth every 6 (six) hours.      Verbal and written Discharge instructions given to the patient. Wound care per Discharge AVS   Signed: Roxy Horseman 11/16/2017, 11:15 AM

## 2017-11-16 NOTE — Evaluation (Signed)
Physical Therapy Evaluation Patient Details Name: Tony [name] MRN: [id number] DOB: 07-27-1965 Today's Date: 11/16/2017   History of Present Illness  52 y.o. male s/p aortobifemoral BPG with RLE fempop BPG 11-15-17 . PMHx: HTN, IBS, HLD, PVD, Depression, Anxiety, Chronic back pain, knee pain and GERD   Clinical Impression  Pt sleeping on arrival and reports difficulty with pain management and sleeping but very willing to get OOB and per wife attempting to get up all night. Pt with abdominal pain and decreased cognition presumed due to pain meds. Pt with decreased strength, function, mobility and gait who will benefit from acute therapy to maximize mobility, function and independence to decrease burden of care. Anticipate pt will progress well once pain decreased and hopefully not require HHPT. Activity limited by pain and elevated BP. Encouraged ambulation and mobility with nursing.   BP supine 140/77 Sitting EOB 163/76 Standing 158/74 In chair after 3 min 147/82 94-97% on 4L Hr 76    Follow Up Recommendations Supervision for mobility/OOB    Equipment Recommendations  Rolling walker with 5" wheels    Recommendations for Other Services OT consult     Precautions / Restrictions Precautions Precautions: Fall      Mobility  Bed Mobility Overal bed mobility: Needs Assistance Bed Mobility: Rolling;Supine to Sit Rolling: Min assist   Supine to sit: HOB elevated;Mod assist     General bed mobility comments: pt with semiroll to right with assist to bring legs off of bed and elevate trunk, cues, reassurance and assist throughout with pt splinting abdomen with pillow  Transfers Overall transfer level: Needs assistance   Transfers: Sit to/from Stand Sit to Stand: Min assist;+2 safety/equipment         General transfer comment: pt with slow transition to trunk extension with bil knee "bouncing" but not buckling pt somewhat tremulous throughout, cues for sequence and transition  to hands on RW  Ambulation/Gait Ambulation/Gait assistance: Min assist;+2 safety/equipment Ambulation Distance (Feet): 4 Feet Assistive device: Rolling walker (2 wheeled) Gait Pattern/deviations: Trunk flexed;Shuffle   Gait velocity interpretation: <1.31 ft/sec, indicative of household ambulator General Gait Details: 4' to slowly step from bed to chair with RW with assist to direct RW and cues for sequence +1 to manage lines  Stairs            Wheelchair Mobility    Modified Rankin (Stroke Patients Only)       Balance Overall balance assessment: Mild deficits observed, not formally tested                                           Pertinent Vitals/Pain Pain Assessment: 0-10 Pain Score: 8  Pain Location: abdomen Pain Descriptors / Indicators: Aching Pain Intervention(s): Limited activity within patient's tolerance;Repositioned;PCA encouraged;Monitored during session    Home Living Family/patient expects to be discharged to:: Private residence Living Arrangements: Spouse/significant other;Children Available Help at Discharge: Available 24 hours/day;Family Type of Home: House Home Access: Stairs to enter Entrance Stairs-Rails: Right Entrance Stairs-Number of Steps: 3 Home Layout: Two level;1/2 bath on main level;Able to live on main level with bedroom/bathroom Home Equipment: Cane - single point Additional Comments: Bed and full bath on 2nd floor. Pt spouse reports he would be able to sleep on couch and use 1/2 bath on main level if unable to navigate stairs in home    Prior Function Level of Independence: Independent with  assistive device(s)         Comments: Pt spouse reports that pt uses SPC in RUE for all mobiltydue to L knee and back pain. Pt is Mod I with increased time required for ADLs     Hand Dominance        Extremity/Trunk Assessment   Upper Extremity Assessment Upper Extremity Assessment: Overall WFL for tasks assessed     Lower Extremity Assessment Lower Extremity Assessment: Generalized weakness;LLE deficits/detail;RLE deficits/detail RLE Deficits / Details: grossly 3/5 did not attempt against resistance LLE Deficits / Details: pt with 2/5 hip flexion, knee extension 3/5    Cervical / Trunk Assessment Cervical / Trunk Assessment: Other exceptions Cervical / Trunk Exceptions: guarding abdomen and maintaining flexed trunk  Communication   Communication: No difficulties;Other (comment)(Pt difficulty communicating this session secondary to pain)  Cognition Arousal/Alertness: Awake/alert;Suspect due to medications Behavior During Therapy: Flat affect Overall Cognitive Status: Impaired/Different from baseline Area of Impairment: Orientation;Memory;Following commands                 Orientation Level: Disoriented to;Time   Memory: Decreased short-term memory Following Commands: Follows one step commands consistently;Follows one step commands with increased time       General Comments: pt initially unable to recall where he was or why he was here but with cues was able to recall. Cues to attend to task and maintain eyes open with pt using PCA throughout session      General Comments      Exercises General Exercises - Lower Extremity Long Arc Quad: AROM;5 reps;Seated;Both Hip Flexion/Marching: AROM;AAROM;5 reps;Seated;Left;Right(AAROM on LLE)   Assessment/Plan    PT Assessment Patient needs continued PT services  PT Problem List Decreased strength;Decreased mobility;Pain;Decreased knowledge of use of DME;Decreased activity tolerance;Decreased cognition       PT Treatment Interventions DME instruction;Functional mobility training;Balance training;Patient/family education;Gait training;Therapeutic activities;Stair training;Therapeutic exercise    PT Goals (Current goals can be found in the Care Plan section)  Acute Rehab PT Goals Patient Stated Goal: return home  PT Goal Formulation:  With patient/family Time For Goal Achievement: 11/30/17 Potential to Achieve Goals: Good    Frequency Min 3X/week   Barriers to discharge        Co-evaluation               AM-PAC PT "6 Clicks" Daily Activity  Outcome Measure Difficulty turning over in bed (including adjusting bedclothes, sheets and blankets)?: Unable Difficulty moving from lying on back to sitting on the side of the bed? : Unable Difficulty sitting down on and standing up from a chair with arms (e.g., wheelchair, bedside commode, etc,.)?: Unable Help needed moving to and from a bed to chair (including a wheelchair)?: A Little Help needed walking in hospital room?: A Little Help needed climbing 3-5 steps with a railing? : A Lot 6 Click Score: 11    End of Session Equipment Utilized During Treatment: Gait belt Activity Tolerance: Patient tolerated treatment well Patient left: in chair;with call bell/phone within reach;with family/visitor present;with chair alarm set Nurse Communication: Mobility status PT Visit Diagnosis: Other abnormalities of gait and mobility (R26.89);Muscle weakness (generalized) (M62.81)    Time: 3734-2876 PT Time Calculation (min) (ACUTE ONLY): 34 min   Charges:   PT Evaluation $PT Eval Moderate Complexity: 1 Mod PT Treatments $Therapeutic Activity: 8-22 mins   PT G Codes:        Elwyn Reach, PT [phone]   [place] B Emeree Mahler 11/16/2017, 9:42 AM

## 2017-11-16 NOTE — Plan of Care (Signed)
Problem: Pain Managment: Goal: General experience of comfort will improve Outcome: Progressing Note:  Pt pain somewhat controlled w/ pain pump, pt needs frequent reminders to use pump if in pain  Problem: Nutrition: Goal: Adequate nutrition will be maintained Outcome: Progressing Note:  Pt w/ NG tube to LIWS, bowel sounds still absent this pm

## 2017-11-16 NOTE — Telephone Encounter (Signed)
sch appt spkt to pt in office

## 2017-11-16 NOTE — Telephone Encounter (Signed)
sch appt lvm 12/19/17 345pm p/o MD

## 2017-11-16 NOTE — Progress Notes (Signed)
   VASCULAR SURGERY ASSESSMENT & PLAN:   1 Day Post-Op s/p: Aorto bifemoral bypass and right femoral to above-knee popliteal artery bypass with vein.  Doing well with palpable right popliteal pulse.  Brisk posterior tibial and peroneal signal bilaterally.  CARDIAC: Hemodynamically stable.  PULMONARY: Encourage I-S  RENAL: His renal function is normal.  He has good urine output.  GI/NUTRITION: We will leave NG tube until bowel function returns.   SUBJECTIVE:   Pain under better control on PCA.  He is currently not complaining of the burning pain in his foot which was his initial presenting symptom.   PHYSICAL EXAM:   Vitals:   11/16/17 0400 11/16/17 0500 11/16/17 0600 11/16/17 0700  BP: (!) 165/80 (!) 157/90 (!) 157/80 140/77  Pulse: 96 69 87 75  Resp: 17 14 19 17   Temp:      TempSrc:      SpO2: 98% 99% 97% 98%  Weight:  190 lb 7.6 oz (86.4 kg)    Height:       His incisions look fine. He has a palpable right popliteal pulse. He has a brisk posterior tibial and peroneal signal with a Doppler.  LABS:   Lab Results  Component Value Date   WBC 12.8 (H) 11/16/2017   HGB 11.6 (L) 11/16/2017   HCT 35.8 (L) 11/16/2017   MCV 91.1 11/16/2017   PLT 225 11/16/2017   Lab Results  Component Value Date   CREATININE 0.87 11/16/2017   Lab Results  Component Value Date   INR 1.01 11/15/2017   CBG (last 3)  Recent Labs    11/15/17 1847  GLUCAP 168*    PROBLEM LIST:    Principal Problem:   Pain of right lower extremity due to ischemia Active Problems:   SMOKER   Depression with anxiety   Essential hypertension   Hyperlipidemia LDL goal <70   Aortoiliac occlusive disease (HCC)   CURRENT MEDS:   . atorvastatin  40 mg Oral Daily  . docusate sodium  100 mg Oral Daily  . enoxaparin (LOVENOX) injection  30 mg Subcutaneous Q24H  . HYDROmorphone   Intravenous Q4H  . metoprolol tartrate  5 mg Intravenous Q6H  . nicotine  14 mg Transdermal Daily  . pantoprazole  40  mg Oral Daily  . sodium chloride flush  3 mL Intravenous Q12H    Deitra Mayo Beeper: [phone] Office: [phone] 11/16/2017

## 2017-11-16 NOTE — Progress Notes (Signed)
Pt very uncomfortable. SBP >160 see flowsheet. PRN Labetalol 10mg  given. SBP continues to be >160. Dr. Scot Dock paged. Ativan 1 mg ordered PRN Q12 hours and given see MAR. SBP goal changed to keep <170. Will continue to monitor.

## 2017-11-16 NOTE — Care Management Note (Signed)
Case Management Note Marvetta Gibbons RN, BSN Unit 4E-Case Manager [phone]  Patient Details  Name: Tony [name] MRN: [id number] Date of Birth: 02-02-1966  Subjective/Objective:  Pt admitted s/p Aorto bifemoral bypass and right femoral to above-knee popliteal artery bypass with vein.                   Action/Plan: PTA Pt lived at home with wife, post op coarse with difficult pain management controlled with PCA- NGT remains in placed on 6/7- tx to 4E- PT eval ordered, CM to follow for transition of care needs anticipate return home.   Expected Discharge Date:                  Expected Discharge Plan:  Home/Self Care  In-House Referral:  NA  Discharge planning Services  CM Consult  Post Acute Care Choice:    Choice offered to:     DME Arranged:    DME Agency:     HH Arranged:    HH Agency:     Status of Service:  In process, will continue to follow  If discussed at Long Length of Stay Meetings, dates discussed:    Discharge Disposition:   Additional Comments:  Dawayne Patricia, RN 11/16/2017, 11:39 AM

## 2017-11-16 NOTE — Progress Notes (Signed)
Pt family at nursing station several times regarding and pt "not being comfortable". Reassured family member that Dr. Scot Dock had been notified regarding pt "not being comfortable" and orders were given. Continue to educate pt to push his "pain pump" when he needs it. Continually having to place button in pt's hand. Family member also concerned regarding BP. Reassured family member Dr. Was aware and orders were given regarding SBP goal. Will continue to monitor.

## 2017-11-16 NOTE — Progress Notes (Signed)
Per telephone call to Dr. Scot Dock, patient is STRICT NPO and may discontinue arterial line as will not flush.

## 2017-11-16 NOTE — Progress Notes (Signed)
ABI's have been completed. Right 0.84 Left 0.56  11/16/17 4:22 PM Tony [name] RVT

## 2017-11-17 ENCOUNTER — Inpatient Hospital Stay (HOSPITAL_COMMUNITY): Payer: BLUE CROSS/BLUE SHIELD

## 2017-11-17 LAB — BASIC METABOLIC PANEL
ANION GAP: 8 (ref 5–15)
BUN: 7 mg/dL (ref 6–20)
CO2: 26 mmol/L (ref 22–32)
CREATININE: 1.05 mg/dL (ref 0.61–1.24)
Calcium: 8.1 mg/dL — ABNORMAL LOW (ref 8.9–10.3)
Chloride: 105 mmol/L (ref 101–111)
GFR calc non Af Amer: >60 mL/min (ref >60)
Glucose, Bld: 119 mg/dL — ABNORMAL HIGH (ref 65–99)
Potassium: 3.2 mmol/L — ABNORMAL LOW (ref 3.5–5.1)
SODIUM: 139 mmol/L (ref 135–145)

## 2017-11-17 LAB — URINALYSIS, ROUTINE W REFLEX MICROSCOPIC
BACTERIA UA: NONE SEEN
BILIRUBIN URINE: NEGATIVE
GLUCOSE, UA: NEGATIVE mg/dL
KETONES UR: 5 mg/dL — AB
NITRITE: NEGATIVE
PH: 7 (ref 5.0–8.0)
Protein, ur: 100 mg/dL — AB
RBC / HPF: >50 RBC/hpf — ABNORMAL HIGH (ref 0–5)
SPECIFIC GRAVITY, URINE: 1.021 (ref 1.005–1.030)
WBC, UA: >50 WBC/hpf — ABNORMAL HIGH (ref 0–5)

## 2017-11-17 NOTE — Progress Notes (Signed)
Nurse recommended visit for this patient.  His sister was present.  He did not seem to be able to communicate at the time. Had prayer with patient and sister for his healing and for all those providing care and for peace of mind and heart. Conard Novak, Chaplain   11/17/17 1600  Clinical Encounter Type  Visited With Patient;Family  Visit Type Initial;Spiritual support  Referral From Nurse  Consult/Referral To Chaplain  Spiritual Encounters  Spiritual Needs Prayer  Stress Factors  Patient Stress Factors None identified  Family Stress Factors None identified

## 2017-11-17 NOTE — Plan of Care (Signed)
Care plans reviewed and patient is progressing.  

## 2017-11-17 NOTE — Evaluation (Signed)
Occupational Therapy Evaluation Patient Details Name: Tony [name] MRN: [id number] DOB: February 04, 1966 Today's Date: 11/17/2017    History of Present Illness 52 y.o. male s/p aortobifemoral BPG with RLE fempop BPG 11-15-17 . PMHx: HTN, IBS, HLD, PVD, Depression, Anxiety, Chronic back pain, knee pain and GERD    Clinical Impression   Patient is s/p Aortobifemoral BPG with R LE fem pop BPG 11/15/17 surgery resulting in functional limitations due to the deficits listed below (see OT problem list). Pt currently requires total (A) for all LB and mod (A) for basic bed mobility. Pt will need to progress to mod I with bed mobility to progress to home level care.  Patient will benefit from skilled OT acutely to increase independence and safety with ADLS to allow discharge [place].     Follow Up Recommendations  Home health OT    Equipment Recommendations  3 in 1 bedside commode    Recommendations for Other Services       Precautions / Restrictions Precautions Precautions: Fall Precaution Comments: watch BP      Mobility Bed Mobility Overal bed mobility: Needs Assistance Bed Mobility: Rolling;Supine to Sit Rolling: Min assist   Supine to sit: HOB elevated;Mod assist Sit to supine: Mod assist;HOB elevated   General bed mobility comments: pt requires (A) to elevate trunk from bed surface and to bring bil LE off eob. pt with pain reports in bottom of R foot on ground. pt requires (A) to elevate bil le back onto bed surface  Transfers                 General transfer comment: pt lethargic at EOB and BP 180/111 static sitting. OT to defer to next session and plus 2 (A) for safety with lines / leads and arousal level    Balance                                           ADL either performed or assessed with clinical judgement   ADL Overall ADL's : Needs assistance/impaired Eating/Feeding: NPO   Grooming: Maximal assistance   Upper Body Bathing: Maximal  assistance   Lower Body Bathing: Total assistance   Upper Body Dressing : Maximal assistance   Lower Body Dressing: Total assistance                 General ADL Comments: pt agreeabl to EOB and attempting to change position. pt with pain at EOB and falling asleep with therapist giving support     Vision         Perception     Praxis      Pertinent Vitals/Pain Pain Assessment: 0-10 Pain Score: 6  Pain Location: R LE abdomen Pain Descriptors / Indicators: Aching;Discomfort;Sharp;Throbbing;Operative site guarding;Grimacing Pain Intervention(s): Monitored during session;Premedicated before session;Repositioned     Hand Dominance Right   Extremity/Trunk Assessment Upper Extremity Assessment Upper Extremity Assessment: Generalized weakness   Lower Extremity Assessment Lower Extremity Assessment: Defer to PT evaluation   Cervical / Trunk Assessment Cervical / Trunk Assessment: Other exceptions Cervical / Trunk Exceptions: guarding abdomen and maintaining flexed trunk   Communication Communication Communication: No difficulties;Other (comment)   Cognition Arousal/Alertness: Lethargic;Suspect due to medications Behavior During Therapy: Flat affect Overall Cognitive Status: Difficult to assess  General Comments: pt did visual and correctly report that sister from [place] at bedside and that she was older sister without therapist asking.pt with eyes closed 90% of session. pt with a full body tremor and reports "extreme pain"   General Comments  pt with good return demo of heart pillow for chest splinting with mobiltiy    Exercises     Shoulder Instructions      Home Living Family/patient expects to be discharged to:: Private residence Living Arrangements: Spouse/significant other;Children Available Help at Discharge: Available 24 hours/day;Family Type of Home: House Home Access: Stairs to enter [employer]  of Steps: 3 Entrance Stairs-Rails: Right Home Layout: Two level;1/2 bath on main level;Able to live on main level with bedroom/bathroom Alternate Level Stairs-Number of Steps: 12 Alternate Level Stairs-Rails: Right Bathroom Shower/Tub: Teacher, early years/pre: Standard     Home Equipment: Cane - single point   Additional Comments: Bed and full bath on 2nd floor. Pt spouse reports he would be able to sleep on couch and use 1/2 bath on main level if unable to navigate stairs in home      Prior Functioning/Environment Level of Independence: Independent with assistive device(s)        Comments: per chart pt uses SPC in RUE for all mobiltydue to L knee and back pain. Pt is Mod I with increased time required for ADLs        OT Problem List: Decreased strength;Decreased activity tolerance;Impaired balance (sitting and/or standing);Decreased cognition;Decreased safety awareness;Decreased knowledge of use of DME or AE;Decreased knowledge of precautions;Cardiopulmonary status limiting activity;Pain      OT Treatment/Interventions: Self-care/ADL training;Therapeutic exercise;Energy conservation;DME and/or AE instruction;Therapeutic activities;Cognitive remediation/compensation;Patient/family education;Balance training;Manual therapy    OT Goals(Current goals can be found in the care plan section) Acute Rehab OT Goals Patient Stated Goal: none stated by patient OT Goal Formulation: With patient Time For Goal Achievement: 12/01/17 Potential to Achieve Goals: Good  OT Frequency: Min 3X/week   Barriers to D/C:            Co-evaluation              AM-PAC PT "6 Clicks" Daily Activity     Outcome Measure Help from another person eating meals?: None Help from another person taking care of personal grooming?: A Lot Help from another person toileting, which includes using toliet, bedpan, or urinal?: A Lot Help from another person bathing (including washing, rinsing,  drying)?: A Lot Help from another person to put on and taking off regular upper body clothing?: A Lot Help from another person to put on and taking off regular lower body clothing?: A Lot 6 Click Score: 14   End of Session Equipment Utilized During Treatment: Oxygen Nurse Communication: Mobility status;Precautions  Activity Tolerance: Patient limited by pain Patient left: in bed;with call bell/phone within reach;with family/visitor present  OT Visit Diagnosis: Unsteadiness on feet (R26.81);Muscle weakness (generalized) (M62.81)                Time: 5053-9767 OT Time Calculation (min): 20 min Charges:  OT General Charges $OT Visit: 1 Visit OT Evaluation $OT Eval Moderate Complexity: 1 Mod G-Codes:      Jeri Modena   OTR/L Pager: [phone] Office: [phone] .   Parke Poisson B 11/17/2017, 3:08 PM

## 2017-11-17 NOTE — Progress Notes (Addendum)
  Progress Note    11/17/2017 9:26 AM 2 Days Post-Op  Subjective:  Patient feels cold this morning; no flatus but feels like he has gas   Vitals:   11/17/17 0800 11/17/17 0843  BP: (!) 180/69   Pulse: (!) 104 82  Resp: 18 (!) 21  Temp: (!) 103.2 F (39.6 C)   SpO2: 95% 99%   Physical Exam: Cardiac:  RRR Lungs:  Non labored; lung sounds diminished lower lung fields; clear to auscultation centrally Incisions:  abd incision and groin incisions soft c/d/i; RLE incision with dressing in place, no drainage noted Extremities:  Brisk PTA signal bilaterally by doppler Abdomen:  Soft to touch, hypoactive bowels Neurologic: A&O  CBC    Component Value Date/Time   WBC 12.8 (H) 11/16/2017 0339   RBC 3.93 (L) 11/16/2017 0339   HGB 11.6 (L) 11/16/2017 0339   HCT 35.8 (L) 11/16/2017 0339   PLT 225 11/16/2017 0339   MCV 91.1 11/16/2017 0339   MCH 29.5 11/16/2017 0339   MCHC 32.4 11/16/2017 0339   RDW 13.9 11/16/2017 0339   LYMPHSABS 1.9 11/13/2017 0511   MONOABS 0.9 11/13/2017 0511   EOSABS 0.8 (H) 11/13/2017 0511   BASOSABS 0.1 11/13/2017 0511    BMET    Component Value Date/Time   NA 139 11/17/2017 0605   K 3.2 (L) 11/17/2017 0605   CL 105 11/17/2017 0605   CO2 26 11/17/2017 0605   GLUCOSE 119 (H) 11/17/2017 0605   BUN 7 11/17/2017 0605   CREATININE 1.05 11/17/2017 0605   CALCIUM 8.1 (L) 11/17/2017 0605   GFRNONAA >60 11/17/2017 0605   GFRAA >60 11/17/2017 0605    INR    Component Value Date/Time   INR 1.01 11/15/2017 1638     Intake/Output Summary (Last 24 hours) at 11/17/2017 0926 Last data filed at 11/17/2017 0520 Gross per 24 hour  Intake 2711.67 ml  Output 800 ml  Net 1911.67 ml     Assessment/Plan:  52 y.o. male is s/p Aorto bifemoral bypass and right femoral to above-knee popliteal artery bypass with vein   2 Days Post-Op    Patent ABF and RLE bypass with improved ABI postoperatively Continue NG tube Fever this morning; UA and culture; CXR  ordered Encouraged OOB and IS  DVT prophylaxis:  lovenox   Tony Ligas, PA-C [facility] [phone] 11/17/2017 9:26 AM  Post op fever work up in progress most likely atelectasis. Splinting from abdominal incision.  Needs incentive spirometer. Will check chest xray and urine Incisions all clean no hematoma Bilious NG output leave for now 2+ PT pulse right foot  Tony Hinds, MD [facility] [place] Office: [phone] Pager: [phone]  Chest xray shows atelectasis.  Needs incentive spirometer Follow up on urine testing  Tony Hinds, MD [facility] [place] Office: [phone] Pager: [phone]

## 2017-11-18 LAB — BASIC METABOLIC PANEL
ANION GAP: 9 (ref 5–15)
BUN: 8 mg/dL (ref 6–20)
CO2: 24 mmol/L (ref 22–32)
Calcium: 7.7 mg/dL — ABNORMAL LOW (ref 8.9–10.3)
Chloride: 108 mmol/L (ref 101–111)
Creatinine, Ser: 0.86 mg/dL (ref 0.61–1.24)
GLUCOSE: 118 mg/dL — AB (ref 65–99)
Potassium: 3.2 mmol/L — ABNORMAL LOW (ref 3.5–5.1)
SODIUM: 141 mmol/L (ref 135–145)

## 2017-11-18 LAB — URINE CULTURE: Culture: NO GROWTH

## 2017-11-18 LAB — CBC
HEMATOCRIT: 33.8 % — AB (ref 39.0–52.0)
HEMOGLOBIN: 11 g/dL — AB (ref 13.0–17.0)
MCH: 30.1 pg (ref 26.0–34.0)
MCHC: 32.5 g/dL (ref 30.0–36.0)
MCV: 92.3 fL (ref 78.0–100.0)
Platelets: 260 10*3/uL (ref 150–400)
RBC: 3.66 MIL/uL — AB (ref 4.22–5.81)
RDW: 13.3 % (ref 11.5–15.5)
WBC: 15.3 10*3/uL — AB (ref 4.0–10.5)

## 2017-11-18 MED ORDER — LABETALOL HCL 5 MG/ML IV SOLN: 5.0000 mg | INTRAVENOUS | Status: DC | PRN

## 2017-11-18 MED ORDER — HYDRALAZINE HCL 20 MG/ML IJ SOLN
5.0000 mg | INTRAMUSCULAR | Status: DC | PRN
  Administered 2017-11-19 – 2017-11-25 (×6): 5 mg via INTRAVENOUS
  Filled 2017-11-18 (×7): qty 1

## 2017-11-18 MED ORDER — CLONIDINE HCL 0.2 MG PO TABS: 0.2000 mg | ORAL_TABLET | Freq: Four times a day (QID) | ORAL | Status: DC | PRN

## 2017-11-18 MED ORDER — WHITE PETROLATUM EX OINT
TOPICAL_OINTMENT | CUTANEOUS | Status: AC
  Administered 2017-11-18: 0.2
  Filled 2017-11-18: qty 28.35

## 2017-11-18 NOTE — Progress Notes (Addendum)
  Progress Note    11/18/2017 9:01 AM 3 Days Post-Op  Subjective:  Having burning pain in R foot   Vitals:   11/18/17 0425 11/18/17 0440  BP:  (!) 166/85  Pulse:  94  Resp: (!) 22 19  Temp:  98.2 F (36.8 C)  SpO2:  96%   Physical Exam: Lungs:  Non labored; diminished lung bases Incisions:  Unremarkable B groin and RLE incisions without sign of infection Extremities:  Peroneal and PTA bilaterally by doppler Abdomen:  abd incision soft; more bowel sounds this morning Neurologic: A&O  CBC    Component Value Date/Time   WBC 15.3 (H) 11/18/2017 0323   RBC 3.66 (L) 11/18/2017 0323   HGB 11.0 (L) 11/18/2017 0323   HCT 33.8 (L) 11/18/2017 0323   PLT 260 11/18/2017 0323   MCV 92.3 11/18/2017 0323   MCH 30.1 11/18/2017 0323   MCHC 32.5 11/18/2017 0323   RDW 13.3 11/18/2017 0323   LYMPHSABS 1.9 11/13/2017 0511   MONOABS 0.9 11/13/2017 0511   EOSABS 0.8 (H) 11/13/2017 0511   BASOSABS 0.1 11/13/2017 0511    BMET    Component Value Date/Time   NA 141 11/18/2017 0323   K 3.2 (L) 11/18/2017 0323   CL 108 11/18/2017 0323   CO2 24 11/18/2017 0323   GLUCOSE 118 (H) 11/18/2017 0323   BUN 8 11/18/2017 0323   CREATININE 0.86 11/18/2017 0323   CALCIUM 7.7 (L) 11/18/2017 0323   GFRNONAA >60 11/18/2017 0323   GFRAA >60 11/18/2017 0323    INR    Component Value Date/Time   INR 1.01 11/15/2017 1638     Intake/Output Summary (Last 24 hours) at 11/18/2017 0901 Last data filed at 11/18/2017 0500 Gross per 24 hour  Intake 3868.33 ml  Output 975 ml  Net 2893.33 ml     Assessment/Plan:  52 y.o. male is s/p Aorto bifemoral bypass and right femoral to above-knee popliteal artery bypass with vein   3 Days Post-Op   Patent ABF and RLE bypass given strong peripheral signals by doppler Continue NG tube due to output overnight and lack of flatus CXR demonstrating atelectasis; stressed importance of IS to nursing staff and patient/family   DVT prophylaxis:  lovenox   Dagoberto Ligas, PA-C [facility] [phone] 11/18/2017 9:01 AM  Palpable PT pulse.  Incisions healing Still with high NG output bilious Incentive spirometer for atelectasis He needs to get out of bed.  He is now POD 3 and has not done anymore that sit up.  Emphasized this to pt and wife.  Ruta Hinds, MD [facility] [place] Office: [phone] Pager: [phone]

## 2017-11-19 LAB — BASIC METABOLIC PANEL
ANION GAP: 6 (ref 5–15)
BUN: 9 mg/dL (ref 6–20)
CHLORIDE: 109 mmol/L (ref 101–111)
CO2: 24 mmol/L (ref 22–32)
CREATININE: 0.87 mg/dL (ref 0.61–1.24)
Calcium: 7.9 mg/dL — ABNORMAL LOW (ref 8.9–10.3)
GFR calc non Af Amer: >60 mL/min (ref >60)
Glucose, Bld: 136 mg/dL — ABNORMAL HIGH (ref 65–99)
POTASSIUM: 3.2 mmol/L — AB (ref 3.5–5.1)
Sodium: 139 mmol/L (ref 135–145)

## 2017-11-19 LAB — CULTURE, BLOOD (ROUTINE X 2)
Culture: NO GROWTH
Culture: NO GROWTH
Special Requests: ADEQUATE
Special Requests: ADEQUATE

## 2017-11-19 NOTE — Progress Notes (Addendum)
Pt still w/ confusion/agitation; Donzetta Matters has been up to see. Pt refusing tele currently and MD is aware. Threatening to pull his IJ, will attempt to keep for now. Refusing vitals. Verbal order to give his Ativan tonight around 2100 and let patient sleep for the night. Will continue to closely monitor pt.  Jaymes Graff, RN

## 2017-11-19 NOTE — Progress Notes (Signed)
Occupational Therapy Treatment Patient Details Name: Tony [name] MRN: [id number] DOB: 1966/03/04 Today's Date: 11/19/2017    History of present illness 52 y.o. male s/p aortobifemoral BPG with RLE fempop BPG 11-15-17 . PMHx: HTN, IBS, HLD, PVD, Depression, Anxiety, Chronic back pain, knee pain and GERD    OT comments  Pt progressing well towards established OT goals. Goals have been updated as needed. Pt donning socks at EOB by bringing ankles to knees with increased effort and time. Pt performing grooming at sink with set up and seated. Pt requiring Min Guard A for toileting and functional mobility with RW. Continue to recommend dc home with HHOT and will continue to follow acutely as admitted.    Follow Up Recommendations  Home health OT    Equipment Recommendations  3 in 1 bedside commode    Recommendations for Other Services      Precautions / Restrictions Precautions Precautions: Fall Precaution Comments: watch BP       Mobility Bed Mobility Overal bed mobility: Needs Assistance             General bed mobility comments: Pt at EOB with RN upon arrival  Transfers Overall transfer level: Needs assistance Equipment used: Rolling walker (2 wheeled) Transfers: Sit to/from Stand Sit to Stand: Min assist         General transfer comment: Min A for power up from low surfaced. VCs for hand placement    Balance Overall balance assessment: Mild deficits observed, not formally tested                                         ADL either performed or assessed with clinical judgement   ADL Overall ADL's : Needs assistance/impaired     Grooming: Wash/dry face;Set up;Supervision/safety;Sitting               Lower Body Dressing: Minimal assistance;Sit to/from stand Lower Body Dressing Details (indicate cue type and reason): Pt able to bring ankles to knees with increased effort and time to don socks. Min A for sit<>Stand Toilet Transfer: Min  guard;Ambulation;RW;Grab Information systems manager Details (indicate cue type and reason): Min Guard A for safety. PToviding cues to increase safety and decrease pain of right food         Functional mobility during ADLs: Min guard;Rolling walker General ADL Comments: Pt demonstrating increased arousal and participation this session. Pt performing grooming at sink seated and then toileting.      Vision       Perception     Praxis      Cognition Arousal/Alertness: Lethargic;Suspect due to medications Behavior During Therapy: Flat affect Overall Cognitive Status: Within Functional Limits for tasks assessed                                 General Comments: Pt following commands and able to problem solve sequencing of ADLs and mobility.        Exercises     Shoulder Instructions       General Comments Wife present at 29 of session. Before activity BP 181/102 seated at EOB. After activity BP 152/94 seated at EOB    Pertinent Vitals/ Pain       Pain Assessment: Faces Faces Pain Scale: Hurts even more Pain Location: R foot and toes Pain Descriptors / Indicators:  Aching;Discomfort;Sharp;Throbbing;Operative site guarding;Grimacing Pain Intervention(s): Monitored during session;Limited activity within patient's tolerance;Repositioned;PCA encouraged  Home Living Family/patient expects to be discharged to:: Private residence Living Arrangements: Spouse/significant other;Children Available Help at Discharge: Available 24 hours/day;Family Type of Home: House Home Access: Stairs to enter [employer] of Steps: 3 Entrance Stairs-Rails: Right Home Layout: Two level;1/2 bath on main level;Able to live on main level with bedroom/bathroom Alternate Level Stairs-Number of Steps: 12 Alternate Level Stairs-Rails: Right Bathroom Shower/Tub: Teacher, early years/pre: Standard     Home Equipment: Cane - single point   Additional  Comments: Bed and full bath on 2nd floor. Pt spouse reports he would be able to sleep on couch and use 1/2 bath on main level if unable to navigate stairs in home      Prior Functioning/Environment Level of Independence: Independent with assistive device(s)        Comments: per chart pt uses SPC in RUE for all mobiltydue to L knee and back pain. Pt is Mod I with increased time required for ADLs   Frequency  Min 3X/week        Progress Toward Goals  OT Goals(current goals can now be found in the care plan section)  Progress towards OT goals: Progressing toward goals;Goals met and updated - see care plan  Acute Rehab OT Goals Patient Stated Goal: none stated by patient OT Goal Formulation: With patient Time For Goal Achievement: 12/01/17 Potential to Achieve Goals: Good ADL Goals Pt Will Perform Grooming: with set-up;with supervision;standing Pt Will Perform Upper Body Bathing: with set-up;sitting Pt Will Perform Lower Body Dressing: with set-up;with supervision;sit to/from stand Pt Will Transfer to Toilet: with set-up;with supervision;ambulating;bedside commode Pt Will Perform Tub/Shower Transfer: Tub transfer;3 in 1;ambulating;rolling walker;with min guard assist Additional ADL Goal #1: pt will complete bed mobility mod i as precursor to adls  Plan Discharge plan remains appropriate    Co-evaluation                 AM-PAC PT "6 Clicks" Daily Activity     Outcome Measure   Help from another person eating meals?: Total Help from another person taking care of personal grooming?: A Little Help from another person toileting, which includes using toliet, bedpan, or urinal?: A Little Help from another person bathing (including washing, rinsing, drying)?: A Little Help from another person to put on and taking off regular upper body clothing?: A Little Help from another person to put on and taking off regular lower body clothing?: A Little 6 Click Score: 16    End of  Session Equipment Utilized During Treatment: Gait belt;Rolling walker  OT Visit Diagnosis: Unsteadiness on feet (R26.81);Muscle weakness (generalized) (M62.81)   Activity Tolerance Patient tolerated treatment well;Patient limited by pain   Patient Left with nursing/sitter in room(in bathroom with RN)   Nurse Communication Mobility status;Precautions        Time: 3428-7681 OT Time Calculation (min): 33 min  Charges: OT General Charges $OT Visit: 1 Visit OT Treatments $Self Care/Home Management : 23-37 mins  [place], OTR/L Acute Rehab Pager: [phone] Office: [place] 11/19/2017, 10:19 AM

## 2017-11-19 NOTE — Progress Notes (Signed)
Pt noted with increasing agitation.  Called nurse "Talaban" and said get out.  Spouse trying to keep patient calm.  Pt pulled off telemetry unit and threatening to pull out IJ.  Wife states he is getting out of control and requested something to calm him down before he hurts himself.   MD notified and MD visit pending.

## 2017-11-19 NOTE — Progress Notes (Signed)
   11/19/17 0016  Vitals  Temp 98.6 F (37 C)  Temp Source Oral  BP (!) 178/93  MAP (mmHg) 118  BP Location Right Arm  BP Method Automatic  Patient Position (if appropriate) Lying  Pulse Rate 92  Pulse Rate Source Monitor  ECG Heart Rate 90  Cardiac Rhythm NSR  Resp 15   Pt Bp 170's to 180's no prns Dr Oneida Alar on the floor for another pt prn hydralizine ordered and given

## 2017-11-19 NOTE — Progress Notes (Addendum)
  Progress Note  11/19/2017 7:34 AM 4 Days Post-Op  Subjective:  Having flatus; persistent burning pain in R foot  Vitals:   11/19/17 0016 11/19/17 0315  BP: (!) 178/93 (!) 162/85  Pulse: 92 93  Resp: 15 17  Temp: 98.6 F (37 C) 98.5 F (36.9 C)  SpO2: 95% 100%   Physical Exam: Lungs:  Non labored on [place] Incisions:  abd incision c/d/i; B groin incisions soft without drainage; RLE incision without any further drainage Extremities:  PTA by doppler bilaterally Abdomen:  Soft; normoactive bowel sounds Neurologic: A&O  CBC    Component Value Date/Time   WBC 15.3 (H) 11/18/2017 0323   RBC 3.66 (L) 11/18/2017 0323   HGB 11.0 (L) 11/18/2017 0323   HCT 33.8 (L) 11/18/2017 0323   PLT 260 11/18/2017 0323   MCV 92.3 11/18/2017 0323   MCH 30.1 11/18/2017 0323   MCHC 32.5 11/18/2017 0323   RDW 13.3 11/18/2017 0323   LYMPHSABS 1.9 11/13/2017 0511   MONOABS 0.9 11/13/2017 0511   EOSABS 0.8 (H) 11/13/2017 0511   BASOSABS 0.1 11/13/2017 0511    BMET    Component Value Date/Time   NA 139 11/19/2017 0500   K 3.2 (L) 11/19/2017 0500   CL 109 11/19/2017 0500   CO2 24 11/19/2017 0500   GLUCOSE 136 (H) 11/19/2017 0500   BUN 9 11/19/2017 0500   CREATININE 0.87 11/19/2017 0500   CALCIUM 7.9 (L) 11/19/2017 0500   GFRNONAA >60 11/19/2017 0500   GFRAA >60 11/19/2017 0500    INR    Component Value Date/Time   INR 1.01 11/15/2017 1638     Intake/Output Summary (Last 24 hours) at 11/19/2017 0734 Last data filed at 11/19/2017 0315 Gross per 24 hour  Intake 2880 ml  Output 375 ml  Net 2505 ml     Assessment/Plan:  52 y.o. male is s/p Aorto bifemoral bypass and right femoral to above-knee popliteal artery bypass with vein   4 Days Post-Op   Patent ABF and RLE bypass with brisk peripheral doppler signals bilaterally Discontinue NG tube Encouraged OOB and ambulation; PT/OT treatment today D/c when mobility increased and tolerating regular diet  Dagoberto Ligas,  PA-C [facility] [phone] 11/19/2017 7:34 AM  I have interviewed the patient and examined the patient. I agree with the findings by the PA. D/C NGT. Start diet later today if he tolerates NGT being out.  D/C Foley Ambulate IS  Gae Gallop, MD [phone]

## 2017-11-19 NOTE — Progress Notes (Signed)
Physical Therapy Treatment Patient Details Name: Tony [name] MRN: [id number] DOB: 1965/06/14 Today's Date: 11/19/2017    History of Present Illness 52 y.o. male s/p aortobifemoral BPG with RLE fempop BPG 11-15-17 . PMHx: HTN, IBS, HLD, PVD, Depression, Anxiety, Chronic back pain, knee pain and GERD     PT Comments    Pt making steady progress with mobility. Continues to c/o rt foot pain but with encouragement able to amb in hallways.   Follow Up Recommendations  Supervision - Intermittent;Home health PT     Equipment Recommendations  Rolling walker with 5" wheels    Recommendations for Other Services       Precautions / Restrictions      Mobility  Bed Mobility Overal bed mobility: Needs Assistance Bed Mobility: Supine to Sit;Sit to Supine     Supine to sit: Supervision;HOB elevated Sit to supine: Supervision;HOB elevated   General bed mobility comments: Incr time  Transfers Overall transfer level: Needs assistance Equipment used: Rolling walker (2 wheeled) Transfers: Sit to/from Stand Sit to Stand: Supervision         General transfer comment: supervision for safety  Ambulation/Gait Ambulation/Gait assistance: Supervision Ambulation Distance (Feet): 150 Feet Assistive device: Rolling walker (2 wheeled) Gait Pattern/deviations: Step-through pattern;Decreased stance time - right;Decreased step length - left;Decreased step length - right;Trunk flexed Gait velocity: decr Gait velocity interpretation: <1.31 ft/sec, indicative of household ambulator General Gait Details: Supervision for lines and safety. Initial cues to stand more erect and use UE's to decr pressure on rt foot   Stairs             Wheelchair Mobility    Modified Rankin (Stroke Patients Only)       Balance Overall balance assessment: Mild deficits observed, not formally tested                                          Cognition Arousal/Alertness:  Lethargic;Suspect due to medications Behavior During Therapy: Flat affect Overall Cognitive Status: Within Functional Limits for tasks assessed                                        Exercises      General Comments General comments (skin integrity, edema, etc.): Wife present      Pertinent Vitals/Pain Pain Assessment: Faces Faces Pain Scale: Hurts whole lot Pain Location: R foot and toes Pain Descriptors / Indicators: Aching;Throbbing;Operative site guarding;Grimacing;Burning Pain Intervention(s): Limited activity within patient's tolerance;Monitored during session;PCA encouraged    Home Living                      Prior Function            PT Goals (current goals can now be found in the care plan section) Progress towards PT goals: Progressing toward goals    Frequency    Min 3X/week      PT Plan Current plan remains appropriate    Co-evaluation              AM-PAC PT "6 Clicks" Daily Activity  Outcome Measure  Difficulty turning over in bed (including adjusting bedclothes, sheets and blankets)?: A Little Difficulty moving from lying on back to sitting on the side of the bed? : A Little Difficulty sitting down on  and standing up from a chair with arms (e.g., wheelchair, bedside commode, etc,.)?: A Little Help needed moving to and from a bed to chair (including a wheelchair)?: A Little Help needed walking in hospital room?: A Little Help needed climbing 3-5 steps with a railing? : A Little 6 Click Score: 18    End of Session   Activity Tolerance: Patient tolerated treatment well Patient left: in bed;with call bell/phone within reach;with family/visitor present Nurse Communication: Mobility status PT Visit Diagnosis: Other abnormalities of gait and mobility (R26.89);Pain Pain - Right/Left: Right Pain - part of body: Ankle and joints of foot     Time: 1422-1444 PT Time Calculation (min) (ACUTE ONLY): 22 min  Charges:   $Gait Training: 8-22 mins                    G Codes:       [facility] PT [place] 11/19/2017, 3:08 PM

## 2017-11-19 NOTE — Progress Notes (Addendum)
Pt more calm this pm, still agitated about IJ but is agreeable to have it assessed in the AM. Agreeable to getting vitals and meds. Turned lights out in room and pt currently resting in bed watching TV. Holding Ativan for now as patient is calm, however medication is offered to him should he want it.  Will continue to closely monitor.  Jaymes Graff, RN

## 2017-11-19 NOTE — Progress Notes (Addendum)
Pt noted increased pain/trembling.  Increased temp.  Tylenol given

## 2017-11-20 MED ORDER — OXYCODONE-ACETAMINOPHEN 5-325 MG PO TABS
1.0000 | ORAL_TABLET | ORAL | Status: DC | PRN
  Administered 2017-11-20 – 2017-11-26 (×23): 2 via ORAL
  Filled 2017-11-20 (×24): qty 2

## 2017-11-20 MED ORDER — HYDROMORPHONE HCL 1 MG/ML IJ SOLN
1.0000 mg | INTRAMUSCULAR | Status: DC | PRN
  Administered 2017-11-20 – 2017-11-22 (×7): 1 mg via INTRAVENOUS
  Filled 2017-11-20 (×7): qty 1

## 2017-11-20 MED FILL — Heparin Sodium (Porcine) Inj 1000 Unit/ML: INTRAMUSCULAR | Qty: 30 | Status: AC

## 2017-11-20 MED FILL — Sodium Chloride IV Soln 0.9%: INTRAVENOUS | Qty: 1000 | Status: AC

## 2017-11-20 NOTE — Progress Notes (Signed)
Pt started percocet today and is now off PCA pump.  Pt noted with s/s of pain and continued swelling of RLL/foot.  Pt leg not elevated-hanging off bed, knee bent with heel/foot in mattress.  Pt encouraged many times to keep leg elevated with continued non-compliance.

## 2017-11-20 NOTE — Progress Notes (Signed)
Pt states he wants regular food.  Refusing liquid diet.   BS active x4.  BM x 2.  Tolerated clear liquids 6/10 and full liquids this am.

## 2017-11-20 NOTE — Progress Notes (Signed)
   VASCULAR SURGERY ASSESSMENT & PLAN:   5 Days Post-Op s/p: Aortobifemoral bypass and right femoral to above-knee popliteal artery bypass with vein.  He has brisk posterior tibial signals bilaterally.  He has significant right lower extremity swelling from his right lower extremity incisions.  I have written to elevate the leg.  DVT prophylaxis: Lovenox  DC central line  Ambulate  Advance diet to full's  Wean off of intravenous pain medication  Anticipate discharge in 48 hours.  SUBJECTIVE:   Not much appetite but no nausea.  Tolerated liquids.  Positive BM.  Positive flatus.  PHYSICAL EXAM:   Vitals:   11/20/17 0020 11/20/17 0024 11/20/17 0436 11/20/17 0514  BP: (!) 162/81   (!) 154/88  Pulse: 90   95  Resp:  16 15 16   Temp: 99.6 F (37.6 C)   99.4 F (37.4 C)  TempSrc: Oral   Oral  SpO2: 96% 96% 98% 98%  Weight:      Height:       Brisk posterior tibial signals with the Doppler. All of his incisions look fine. Abdomen with normal pitched bowel sounds.  LABS:    PROBLEM LIST:    Principal Problem:   Pain of right lower extremity due to ischemia Active Problems:   SMOKER   Depression with anxiety   Essential hypertension   Hyperlipidemia LDL goal <70   Aortoiliac occlusive disease (HCC)   CURRENT MEDS:   . atorvastatin  40 mg Oral Daily  . docusate sodium  100 mg Oral Daily  . enoxaparin (LOVENOX) injection  30 mg Subcutaneous Q24H  . HYDROmorphone   Intravenous Q4H  . metoprolol tartrate  5 mg Intravenous Q6H  . nicotine  14 mg Transdermal Daily  . pantoprazole  40 mg Oral Daily  . sodium chloride flush  3 mL Intravenous Q12H    Deitra Mayo Beeper: [phone] Office: [phone] 11/20/2017

## 2017-11-20 NOTE — Plan of Care (Addendum)
Problem: Clinical Measurements: Goal: Ability to maintain clinical measurements within normal limits will improve 11/20/2017 0533 by Jaymes Graff, RN Note:  BPs improved running 034J systolic  Problem: Activity: Goal: Risk for activity intolerance will decrease 11/20/2017 0533 by Jaymes Graff, RN Note:  Pt walking independently in room, currently prolonged mobility is limited by pain   Problem: Pain Managment: Goal: General experience of comfort will improve 11/20/2017 0533 by Jaymes Graff, RN Outcome: Progressing Note:  Pt still complains of burning pain in foot, feels it is worse then pre-op; dilaudid PCA does not touch this; c/o only minimal incisional pain. Resistant to elevation of extremities 2/2 to pain   Problem: Bowel/Gastric: Goal: Gastrointestinal status for postoperative course will improve 11/20/2017 0533 by Jaymes Graff, RN Outcome: Progressing Note:  +BM, tolerating clear diet well w/ no nausea/vomiting   Problem: Skin Integrity: Goal: Demonstration of wound healing without infection will improve 11/20/2017 0533 by Jaymes Graff, RN Outcome: Progressing Note:  All incisions CDI

## 2017-11-20 NOTE — Progress Notes (Addendum)
Discontinued Right IJ at 1015am without difficulty.  Catheter intact.  No bleeding. Pt compliant laying flat x 30 mins.  Pressure dressing applied.

## 2017-11-20 NOTE — Progress Notes (Signed)
Physical Therapy Treatment Patient Details Name: Tony [name] MRN: [id number] DOB: 10-10-1965 Today's Date: 11/20/2017    History of Present Illness 52 y.o. male s/p aortobifemoral BPG with RLE fempop BPG 11-15-17 . PMHx: HTN, IBS, HLD, PVD, Depression, Anxiety, Chronic back pain, knee pain and GERD     PT Comments    Pt with improving mobility. Cognitive issues noted. When I entered room pt standing up. Legrest on recliner still elevated. The pt reports he couldn't figure out how to get it down so he climbed out of chair with legrest elevated. Pt also with IV still attached and he was walking toward bathroom and he was unaware that he was leaving IV behind. At end of session reiterated to pt he needed to call for assistance due to his multiple lines/tubes.   Follow Up Recommendations  Home health PT;Supervision/Assistance - 24 hour     Equipment Recommendations  Rolling walker with 5" wheels    Recommendations for Other Services       Precautions / Restrictions Precautions Precautions: Fall Restrictions Weight Bearing Restrictions: No    Mobility  Bed Mobility               General bed mobility comments: Pt up in room  Transfers Overall transfer level: Needs assistance Equipment used: Rolling walker (2 wheeled) Transfers: Sit to/from Stand Sit to Stand: Supervision         General transfer comment: supervision for safety  Ambulation/Gait Ambulation/Gait assistance: Supervision Ambulation Distance (Feet): 250 Feet Assistive device: Rolling walker (2 wheeled) Gait Pattern/deviations: Step-through pattern;Decreased stance time - right;Decreased stride length Gait velocity: decr Gait velocity interpretation: <1.31 ft/sec, indicative of household ambulator General Gait Details: Supervision for lines and safety.   Stairs             Wheelchair Mobility    Modified Rankin (Stroke Patients Only)       Balance Overall balance assessment: Mild  deficits observed, not formally tested                                          Cognition Arousal/Alertness: Awake/alert Behavior During Therapy: Flat affect;Impulsive Overall Cognitive Status: Impaired/Different from baseline Area of Impairment: Safety/judgement;Memory                     Memory: Decreased short-term memory   Safety/Judgement: Decreased awareness of safety            Exercises      General Comments        Pertinent Vitals/Pain Pain Assessment: Faces Faces Pain Scale: Hurts little more Pain Location: rt foot Pain Descriptors / Indicators: Aching;Throbbing;Operative site guarding;Burning;Grimacing    Home Living                      Prior Function            PT Goals (current goals can now be found in the care plan section) Progress towards PT goals: Progressing toward goals    Frequency    Min 3X/week      PT Plan Current plan remains appropriate    Co-evaluation              AM-PAC PT "6 Clicks" Daily Activity  Outcome Measure  Difficulty turning over in bed (including adjusting bedclothes, sheets and blankets)?: A Little Difficulty moving from lying on  back to sitting on the side of the bed? : A Little Difficulty sitting down on and standing up from a chair with arms (e.g., wheelchair, bedside commode, etc,.)?: A Little Help needed moving to and from a bed to chair (including a wheelchair)?: A Little Help needed walking in hospital room?: A Little Help needed climbing 3-5 steps with a railing? : A Little 6 Click Score: 18    End of Session   Activity Tolerance: Patient tolerated treatment well Patient left: with call bell/phone within reach;in chair Nurse Communication: Mobility status;Other (comment)(Pt up in room unassisted when I arrived) PT Visit Diagnosis: Other abnormalities of gait and mobility (R26.89);Pain Pain - Right/Left: Right Pain - part of body: Ankle and joints of foot      Time: 1151-1210 PT Time Calculation (min) (ACUTE ONLY): 19 min  Charges:  $Gait Training: 8-22 mins                    G Codes:       [facility] PT [place] 11/20/2017, 1:06 PM

## 2017-11-20 NOTE — Progress Notes (Signed)
Occupational Therapy Treatment Patient Details Name: Tony [name] MRN: [id number] DOB: Oct 28, 1965 Today's Date: 11/20/2017    History of present illness 52 y.o. male s/p aortobifemoral BPG with RLE fempop BPG 11-15-17 . PMHx: HTN, IBS, HLD, PVD, Depression, Anxiety, Chronic back pain, knee pain and GERD    OT comments  Pt progressing towards established OT goals. Pt's main concern is pain and edema at right ankles. Educating pt on edema management with ankle pumps and elevation. Providing education and hand out on compensatory techniques for LB dressing and safe tub transfer. Pt requiring Min A and Max cues for simulated tub transfer with use of RW and 3N1. Pt demonstrating poor recall of education (including LB dressing, tub transfer techniques, and edema management) at end of session. Continue to recommend dc home with HHOT and will continue to follow acutely as admitted.   Follow Up Recommendations  Home health OT    Equipment Recommendations  3 in 1 bedside commode    Recommendations for Other Services      Precautions / Restrictions Precautions Precautions: Fall Precaution Comments: watch BP Restrictions Weight Bearing Restrictions: No       Mobility Bed Mobility Overal bed mobility: Needs Assistance Bed Mobility: Supine to Sit     Supine to sit: Supervision;HOB elevated     General bed mobility comments: supervision for safety  Transfers Overall transfer level: Needs assistance Equipment used: Rolling walker (2 wheeled) Transfers: Sit to/from Stand Sit to Stand: Min guard         General transfer comment: Min Guard A for safety    Balance Overall balance assessment: Mild deficits observed, not formally tested                                         ADL either performed or assessed with clinical judgement   ADL Overall ADL's : Needs assistance/impaired                     Lower Body Dressing: Minimal assistance;Sit to/from  stand Lower Body Dressing Details (indicate cue type and reason): Educating pt on compensatory techniques for donning "weaker" leg first into pants and underwear. Pt able to don socks by brining ankles to knees. MIn A for dynamic standing balance         Tub/ Shower Transfer: Minimal assistance;Ambulation;3 in 1;Rolling walker;Cueing for sequencing;Cueing for safety Tub/Shower Transfer Details (indicate cue type and reason): Min A for safety and balance. Educating pt on safe tub transfer techniques and providing handout for carry over to home. Pt requiring Max cues for sequencing during simulated tub transfer.  Functional mobility during ADLs: Min guard;Rolling walker General ADL Comments: Focusing session on LB ADLs and tub transfers. Pt with poor recall and would benefit from further practice     Vision       Perception     Praxis      Cognition Arousal/Alertness: Awake/alert Behavior During Therapy: Flat affect;Impulsive Overall Cognitive Status: Impaired/Different from baseline Area of Impairment: Safety/judgement;Memory                     Memory: Decreased short-term memory Following Commands: Follows one step commands with increased time Safety/Judgement: Decreased awareness of safety     General Comments: Pt presenting with decreased ST memory. At end of session, reviewing education provided to pt throughout session. Pt unable to recalling  compensaotry tehcniques for LB dressing, tub transfers, or         Exercises Exercises: General Lower Extremity General Exercises - Lower Extremity Ankle Circles/Pumps: AROM;Right;10 reps;Seated   Shoulder Instructions       General Comments BP 168/86 after therapy. RN notified    Pertinent Vitals/ Pain       Pain Assessment: Faces Faces Pain Scale: Hurts little more Pain Location: rt foot Pain Descriptors / Indicators: Aching;Throbbing;Operative site guarding;Burning;Grimacing  Home Living Family/patient expects  to be discharged to:: Private residence Living Arrangements: Spouse/significant other;Children Available Help at Discharge: Available 24 hours/day;Family Type of Home: House Home Access: Stairs to enter [employer] of Steps: 3 Entrance Stairs-Rails: Right Home Layout: Two level;1/2 bath on main level;Able to live on main level with bedroom/bathroom Alternate Level Stairs-Number of Steps: 12 Alternate Level Stairs-Rails: Right Bathroom Shower/Tub: Teacher, early years/pre: Standard     Home Equipment: Cane - single point   Additional Comments: Bed and full bath on 2nd floor. Pt spouse reports he would be able to sleep on couch and use 1/2 bath on main level if unable to navigate stairs in home      Prior Functioning/Environment Level of Independence: Independent with assistive device(s)        Comments: per chart pt uses SPC in RUE for all mobiltydue to L knee and back pain. Pt is Mod I with increased time required for ADLs   Frequency  Min 3X/week        Progress Toward Goals  OT Goals(current goals can now be found in the care plan section)  Progress towards OT goals: Progressing toward goals  Acute Rehab OT Goals Patient Stated Goal: none stated by patient OT Goal Formulation: With patient Time For Goal Achievement: 12/01/17 Potential to Achieve Goals: Good ADL Goals Pt Will Perform Grooming: with set-up;with supervision;standing Pt Will Perform Upper Body Bathing: with set-up;sitting Pt Will Perform Lower Body Dressing: with set-up;with supervision;sit to/from stand Pt Will Transfer to Toilet: with set-up;with supervision;ambulating;bedside commode Pt Will Perform Tub/Shower Transfer: Tub transfer;3 in 1;ambulating;rolling walker;with min guard assist Additional ADL Goal #1: pt will complete bed mobility mod i as precursor to adls  Plan Discharge plan remains appropriate    Co-evaluation                 AM-PAC PT "6 Clicks" Daily  Activity     Outcome Measure   Help from another person eating meals?: A Little Help from another person taking care of personal grooming?: A Little Help from another person toileting, which includes using toliet, bedpan, or urinal?: A Little Help from another person bathing (including washing, rinsing, drying)?: A Little Help from another person to put on and taking off regular upper body clothing?: A Little Help from another person to put on and taking off regular lower body clothing?: A Little 6 Click Score: 18    End of Session Equipment Utilized During Treatment: Gait belt;Rolling walker  OT Visit Diagnosis: Unsteadiness on feet (R26.81);Muscle weakness (generalized) (M62.81)   Activity Tolerance Patient tolerated treatment well;Patient limited by pain   Patient Left with nursing/sitter in room;in chair;with call bell/phone within reach   Nurse Communication Mobility status;Precautions        Time: 4034-7425 OT Time Calculation (min): 27 min  Charges: OT General Charges $OT Visit: 1 Visit OT Treatments $Self Care/Home Management : 23-37 mins  [place], OTR/L Acute Rehab Pager: [phone] Office: [place]  11/20/2017, 1:57 PM

## 2017-11-21 LAB — CBC
HCT: 32.9 % — ABNORMAL LOW (ref 39.0–52.0)
Hemoglobin: 10.6 g/dL — ABNORMAL LOW (ref 13.0–17.0)
MCH: 29.5 pg (ref 26.0–34.0)
MCHC: 32.2 g/dL (ref 30.0–36.0)
MCV: 91.6 fL (ref 78.0–100.0)
PLATELETS: 354 10*3/uL (ref 150–400)
RBC: 3.59 MIL/uL — AB (ref 4.22–5.81)
RDW: 13 % (ref 11.5–15.5)
WBC: 9.6 10*3/uL (ref 4.0–10.5)

## 2017-11-21 MED ORDER — FAMOTIDINE 20 MG PO TABS
20.0000 mg | ORAL_TABLET | Freq: Two times a day (BID) | ORAL | Status: DC
  Administered 2017-11-21 – 2017-11-26 (×10): 20 mg via ORAL
  Filled 2017-11-21 (×10): qty 1

## 2017-11-21 NOTE — Progress Notes (Addendum)
Pt complained of pain 7/10, moaning and crying. Percocet didn't help him much. Stated right groin is hurting bad and incision site is rubbing against each other. Site tender to touch,  with slight swelling and redness around incision, soft, no drainage. Bilateral pedal pulses verified with doppler. Telfa dressing applied to bilateral groin. IV dilaudid administered as ordered. Family by bedside. Call light within reach. Will continue to monitor.

## 2017-11-21 NOTE — Care Management Note (Signed)
Case Management Note Marvetta Gibbons RN, BSN Unit 4E-Case Manager [phone]  Patient Details  Name: Tony [name] MRN: [id number] Date of Birth: [birth year]  Subjective/Objective:  Pt admitted s/p Aorto bifemoral bypass and right femoral to above-knee popliteal artery bypass with vein.                   Action/Plan: PTA Pt lived at home with wife, post op coarse with difficult pain management controlled with PCA- NGT remains in placed on 6/7- tx to 4E- PT eval ordered, CM to follow for transition of care needs anticipate return home.   Expected Discharge Date:                  Expected Discharge Plan:  [place]  In-House Referral:  NA  Discharge planning Services  CM Consult  Post Acute Care Choice:  Home Health, Durable Medical Equipment Choice offered to:  Patient, Spouse  DME Arranged:  Walker rolling DME Agency:  [place]:  PT, OT [place] Agency:  [facility]  Status of Service:  Completed, signed off  If discussed at [employer] of Stay Meetings, dates discussed:    Discharge Disposition: home/home health   Additional Comments:  11/21/17- 27 -[place]- orders placed for HHPT/OT- spoke with pt at bedside regarding transition of care needs- per pt he lives in [place]- states he can stay on lower level until able to navigate stairs- choice offered for [facility] agency- pt reports he is fine with either [facility] or [facility] which he has heard of- will call to see if [facility] can accept- pt also requesting RW for home which was recommended by PT- call made to [place] with [facility] regarding [place] [place] needs- [facility] can accept referral for HHPT/OT- will notify Jermiane with [facility] for DME need- and have RW delivered to room prior to discharge.   Dawayne Patricia, RN 11/21/2017, 2:10 PM

## 2017-11-21 NOTE — Progress Notes (Signed)
Physical Therapy Treatment Patient Details Name: Tony [name] MRN: [id number] DOB: 1965-12-13 Today's Date: 11/21/2017    History of Present Illness Tony [name] is a 52 y.o. black male s/p aortobifemoral BPG with RLE fempop BPG 11-15-17 . PMHx: HTN, IBS, HLD, PVD, Depression, Anxiety, Chronic back pain, knee pain and GERD.     PT Comments    Pt received up in chair, RLE elevated on one pillow, sock doffed. Pt c/o8/10 pain, appears somewhat anxious about current state, verbalizes concerns that he is unable to manage edema effectively and frustration that pain immediately worsens with cessation of elevation. Toes noted to be gray in color at the plantar aspect, with bright red erythema at the plantar forefoot pad, significant edema noted, from ankle to mid thigh easily visualized, and noted scrotal edema visualized to the size of a small grapefruit. Pt reports constant N/T in 5 toes, worse at lateral aspect of foot, and reports that's short periods of foot to ground have resulted in burning searing pain in forefoot as well. Weightbearing is attempted during mobility, but tolerated only mildly. Pt demonstrates safe, slow performance of 3 stairs in preparation of DC, but requires significant additional time and heavy effort to complete. Upon return, patient is educated on elevating leg higher as tolerated, ("3 pillows") but has limited tolerance when reclined slightly to accommodate Right posterior hip soft tissue restrictions. Pt reports near-immediate onset of Rt upper leg paresthesias which is able to abate by performing Left lateral trunk flexion in chair, of-weighting the Right hip. Pt making progress in mobility, but still limited by pain. Concerns for scrotal edema at this time, questioning appropriateness in timeframe to involve manual lymph drainage as a supportive therapy. Will continue to follow acutely. Detailed report delivered verbally to RN.     Follow Up Recommendations  Home health  PT;Supervision/Assistance - 24 hour     Equipment Recommendations  Rolling walker with 5" wheels    Recommendations for Other Services OT consult     Precautions / Restrictions Precautions Precautions: Fall Precaution Comments: Monitor limb edema, scrotal edema.  Restrictions Weight Bearing Restrictions: No    Mobility  Bed Mobility               General bed mobility comments: received up in chair   Transfers Overall transfer level: Modified independent Equipment used: Rolling walker (2 wheeled) Transfers: Sit to/from Stand Sit to Stand: Modified independent (Device/Increase time)         General transfer comment: slow, and cautious, limited by pain in foot, and discomfort in edematous scrotoum..  Ambulation/Gait Ambulation/Gait assistance: Supervision Ambulation Distance (Feet): 25 Feet(pt with elevated pain, has already AMB with RN this date, hence deferred additional distance to later, prioritizing stairs. ) Assistive device: Rolling walker (2 wheeled)       General Gait Details: slow and antalgic, requires heavy BUE support on walker, attempts to weight bear on RLE as tolerated.    Stairs Stairs: Yes Stairs assistance: Supervision Stair Management: One rail Right;Forwards;Step to pattern Number of Stairs: 3     Wheelchair Mobility    Modified Rankin (Stroke Patients Only)       Balance Overall balance assessment: Modified Independent;No apparent balance deficits (not formally assessed)                                          Cognition Arousal/Alertness: Awake/alert Behavior During  Therapy: Anxious(mildly anxious about current persistent pain and swelling, verbalizes that his concerns are not being adequately addressed. ) Overall Cognitive Status: Within Functional Limits for tasks assessed                                        Exercises      General Comments        Pertinent Vitals/Pain Pain  Assessment: 0-10 Pain Score: 8  Pain Location: rt foot Pain Descriptors / Indicators: (Numbness and tingling in 5 toes, fairly constant, wil progress to sharp pain in the Riight toe/forefoot junction, and eventually burning/searing pain if not elevated for very long. Also complains of wrok upper leg numbness with elevation neutral trunk) Pain Intervention(s): Limited activity within patient's tolerance;Monitored during session;Repositioned    Home Living                      Prior Function            PT Goals (current goals can now be found in the care plan section) Acute Rehab PT Goals Patient Stated Goal: none stated by patient PT Goal Formulation: With patient/family Time For Goal Achievement: 11/30/17 Potential to Achieve Goals: Good Progress towards PT goals: Progressing toward goals    Frequency    Min 3X/week      PT Plan Current plan remains appropriate    Co-evaluation              AM-PAC PT "6 Clicks" Daily Activity  Outcome Measure  Difficulty turning over in bed (including adjusting bedclothes, sheets and blankets)?: A Little Difficulty moving from lying on back to sitting on the side of the bed? : A Little Difficulty sitting down on and standing up from a chair with arms (e.g., wheelchair, bedside commode, etc,.)?: A Little Help needed moving to and from a bed to chair (including a wheelchair)?: A Little Help needed walking in hospital room?: A Little Help needed climbing 3-5 steps with a railing? : A Lot 6 Click Score: 17    End of Session Equipment Utilized During Treatment: Gait belt Activity Tolerance: Patient tolerated treatment well;No increased pain;Patient limited by pain Patient left: with call bell/phone within reach;in chair;Other (comment)(RL Elevated to heart level) Nurse Communication: Mobility status;Other (comment)(concerns of scrotal edema, high pain levels) PT Visit Diagnosis: Other abnormalities of gait and mobility  (R26.89);Pain Pain - Right/Left: Right Pain - part of body: Ankle and joints of foot     Time: 1356-1425 PT Time Calculation (min) (ACUTE ONLY): 29 min  Charges:  $Gait Training: 8-22 mins $Therapeutic Activity: 8-22 mins                    G Codes:       2:53 PM, 27-Nov-2017 Etta Grandchild, PT, DPT Physical Therapist - [place] [phone] (Pager)  [phone] (Office)      [name],Tony C 2017/11/27, 2:43 PM

## 2017-11-21 NOTE — Progress Notes (Addendum)
paiged DR. Scot Dock and notified pt is complaining of severe pain to his right groin and leg and SOB with shivering, and high BP . pt requesting stronger pain medication between percocet. See MAR for new order and pain medication administration. @l  o2 applied via [place], o2 sat 100% . Pt stated o2 is helping. Pt's family by bedside will continue to monitor.

## 2017-11-21 NOTE — Progress Notes (Addendum)
  Progress Note    11/21/2017 8:51 AM 6 Days Post-Op  Subjective:  Complaining of persistent R foot pain. Tolerating full diet   Vitals:   11/21/17 0620 11/21/17 0758  BP: (!) 157/77 (!) 163/86  Pulse: 80 70  Resp: 18 16  Temp:  98.1 F (36.7 C)  SpO2:  100%   Physical Exam: Cardiac:  RRR Lungs:  Non labored on RA Incisions:  abd incision c/d/i; groin incisions c/d/i; RLE vein harvest incisions nearly healed Extremities:  R DP and PTA by doppler; L PT and AT by doppler; toes of R foot somewhat dusky Abdomen:  Soft; non-tender; normoactive sounds Neurologic: A&O  CBC    Component Value Date/Time   WBC 9.6 11/21/2017 0658   RBC 3.59 (L) 11/21/2017 0658   HGB 10.6 (L) 11/21/2017 0658   HCT 32.9 (L) 11/21/2017 0658   PLT 354 11/21/2017 0658   MCV 91.6 11/21/2017 0658   MCH 29.5 11/21/2017 0658   MCHC 32.2 11/21/2017 0658   RDW 13.0 11/21/2017 0658   LYMPHSABS 1.9 11/13/2017 0511   MONOABS 0.9 11/13/2017 0511   EOSABS 0.8 (H) 11/13/2017 0511   BASOSABS 0.1 11/13/2017 0511    BMET    Component Value Date/Time   NA 139 11/19/2017 0500   K 3.2 (L) 11/19/2017 0500   CL 109 11/19/2017 0500   CO2 24 11/19/2017 0500   GLUCOSE 136 (H) 11/19/2017 0500   BUN 9 11/19/2017 0500   CREATININE 0.87 11/19/2017 0500   CALCIUM 7.9 (L) 11/19/2017 0500   GFRNONAA >60 11/19/2017 0500   GFRAA >60 11/19/2017 0500    INR    Component Value Date/Time   INR 1.01 11/15/2017 1638     Intake/Output Summary (Last 24 hours) at 11/21/2017 0851 Last data filed at 11/21/2017 0300 Gross per 24 hour  Intake 1386.67 ml  Output 300 ml  Net 1086.67 ml     Assessment/Plan:  52 y.o. male is s/p Aortobifemoral bypass and right femoral to above-knee popliteal artery bypass with vein  6 Days Post-Op   Encouraged to elevate R leg above heart when in bed Case management arranging [facility] PT/OT Patent ABF and RLE bypass with strong signals by doppler Possible d/c tomorrow  DVT prophylaxis:   lovenox   Dagoberto Ligas, PA-C [facility] [phone] 11/21/2017 8:51 AM  I have interviewed the patient and examined the patient. I agree with the findings by the PA. His incisions are all healing nicely.  Both feet are warm and well-perfused.  I have advanced him to a regular diet.  Once his pain is controlled on po medications he should be ready for discharge.  We have discussed the importance of leg elevation for his right leg swelling related to his bypass on the right.  Gae Gallop, MD [phone]

## 2017-11-22 LAB — CREATININE, SERUM
CREATININE: 0.92 mg/dL (ref 0.61–1.24)
GFR calc Af Amer: >60 mL/min (ref >60)

## 2017-11-22 MED ORDER — HYDROMORPHONE HCL 1 MG/ML IJ SOLN
1.0000 mg | INTRAMUSCULAR | Status: DC | PRN
  Administered 2017-11-22 – 2017-11-26 (×27): 1 mg via INTRAVENOUS
  Filled 2017-11-22 (×28): qty 1

## 2017-11-22 NOTE — Progress Notes (Addendum)
  Progress Note    11/22/2017 7:30 AM 7 Days Post-Op  Subjective:  No change in pain R foot   Vitals:   11/21/17 2028 11/22/17 0456  BP: (!) 177/96 (!) 173/90  Pulse: 99 93  Resp: 15 18  Temp: 98.9 F (37.2 C) 99 F (37.2 C)  SpO2: 96% 96%   Physical Exam: Cardiac:  RRR Lungs:  Non labored Incisions:  abd and groin incisions unremarkable; RLE incisions also healing well Extremities:  RLE remains edematous; DP monophasic by doppler; PTA multiphasic by doppler; all toes R foot dusky in appearance Abdomen:  Soft, non tender Neurologic: A&O  CBC    Component Value Date/Time   WBC 9.6 11/21/2017 0658   RBC 3.59 (L) 11/21/2017 0658   HGB 10.6 (L) 11/21/2017 0658   HCT 32.9 (L) 11/21/2017 0658   PLT 354 11/21/2017 0658   MCV 91.6 11/21/2017 0658   MCH 29.5 11/21/2017 0658   MCHC 32.2 11/21/2017 0658   RDW 13.0 11/21/2017 0658   LYMPHSABS 1.9 11/13/2017 0511   MONOABS 0.9 11/13/2017 0511   EOSABS 0.8 (H) 11/13/2017 0511   BASOSABS 0.1 11/13/2017 0511    BMET    Component Value Date/Time   NA 139 11/19/2017 0500   K 3.2 (L) 11/19/2017 0500   CL 109 11/19/2017 0500   CO2 24 11/19/2017 0500   GLUCOSE 136 (H) 11/19/2017 0500   BUN 9 11/19/2017 0500   CREATININE 0.92 11/22/2017 0428   CALCIUM 7.9 (L) 11/19/2017 0500   GFRNONAA >60 11/22/2017 0428   GFRAA >60 11/22/2017 0428    INR    Component Value Date/Time   INR 1.01 11/15/2017 1638     Intake/Output Summary (Last 24 hours) at 11/22/2017 0730 Last data filed at 11/21/2017 2000 Gross per 24 hour  Intake 120 ml  Output -  Net 120 ml     Assessment/Plan:  52 y.o. male is s/p ABF with R fem to [place] popliteal bypass with vein 7 Days Post-Op   DP, PTA by doppler; ABF and R fem-pop bypass remains patent All toes dusky in color, now appearing ischemic likely a product of chronic small vessel disease Dr. Bridgett Larsson considering full anticoagulation Continue pain medication regimen on schedule   Dagoberto Ligas,  PA-C [facility] [phone] 11/22/2017 7:30 AM  Addendum  I have independently interviewed and examined the patient, and I agree with the physician assistant's findings.  I discussed the findings with Dr. Scot Dock.  Incisions all look good.  This patient's presenting sx were his ischemic right toes, so this was pre-existing to the procedure.  The patient does note this also.   - At this point, the patient is ok to discharge once his pain control is improved. - No anticoagulation needed - Dr. Scot Dock planned already on letting the right foot demarcate.   Adele Barthel, MD, FACS [facility] [place] Office: [phone] Pager: [phone]  11/22/2017, 9:53 AM

## 2017-11-22 NOTE — Progress Notes (Addendum)
Occupational Therapy Treatment and Discharge Patient Details Name: Tony [name] MRN: [id number] DOB: August 09, 1965 Today's Date: 11/22/2017    History of present illness Tony [name] is a 52 y.o. black male s/p aortobifemoral BPG with RLE fempop BPG 11-15-17 . PMHx: HTN, IBS, HLD, PVD, Depression, Anxiety, Chronic back pain, knee pain and GERD.    OT comments  This 52 yo male admitted and underwent above presents to acute OT with all education completed from an acute care standpoint. Recommend HHOT to address any issues once home. We will D/C from acute OT. I called [facility] to ask them to please add 3n1 to pt's DME for home.   Follow Up Recommendations  Home health OT;Supervision - Intermittent    Equipment Recommendations  3 in 1 bedside commode       Precautions / Restrictions Precautions Precautions: Fall Precaution Comments: Monitor limb edema, scrotal edema.  Restrictions Weight Bearing Restrictions: No       Mobility Bed Mobility Overal bed mobility: Independent                Transfers Overall transfer level: Needs assistance Equipment used: Rolling walker (2 wheeled) Transfers: Sit to/from Stand Sit to Stand: Modified independent (Device/Increase time)         General transfer comment: slow, and cautious, limited by pain in foot, and discomfort in edematous scrotoum..    Balance Overall balance assessment: Modified Independent(reliant on RW intermittently in standing)                                         ADL either performed or assessed with clinical judgement   ADL Overall ADL's : Needs assistance/impaired                     Lower Body Dressing: Sit to/from stand;Set up Lower Body Dressing Details (indicate cue type and reason): Pt fully aware to put RLE in pants/underwear first to make getting dressed easier. Pt reports he tried "tighty whitey" underwear for scrotal support but this did not help him it only made it harder  for him to walk Toilet Transfer: Ambulation;RW;Modified Independent         Tub/Shower Transfer Details (indicate cue type and reason): I brought a regular sized 3n1 into room for him to see how this works with stepping over side of tub (trashcan turned sideways) to sit on 3n1, how to remove back of 3n1 so shower curtain will pull through. Pt also has handout that other OT gave him showing the steps of getting into and out of tub with 3n1   General ADL Comments: Pt is fully aware that he needs to keep right foot elevated above his heart when he is laying down, however he reports it really puts too much pressure on his chest in this position     Vision Patient Visual Report: No change from baseline            Cognition Arousal/Alertness: Awake/alert Behavior During Therapy: [facility] for tasks assessed/performed(is wondering what will happen with his foot) Overall Cognitive Status: Within Functional Limits for tasks assessed  Pertinent Vitals/ Pain       Pain Assessment: 0-10 Pain Score: 8  Pain Location: rt foot when in dependent position Pain Descriptors / Indicators: Other (Comment)(tingling when sitting in bed; throbbing and burning when foot is in dependent position) Pain Intervention(s): Limited activity within patient's tolerance;Monitored during session;Repositioned         Frequency  Min 3X/week           Plan Discharge plan remains appropriate       AM-PAC PT "6 Clicks" Daily Activity     Outcome Measure   Help from another person eating meals?: None Help from another person taking care of personal grooming?: None Help from another person toileting, which includes using toliet, bedpan, or urinal?: None Help from another person bathing (including washing, rinsing, drying)?: A Little Help from another person to put on and taking off regular upper body clothing?: A Little   6 Click Score:  18    End of Session Equipment Utilized During Treatment: Rolling walker  OT Visit Diagnosis: Unsteadiness on feet (R26.81);Muscle weakness (generalized) (M62.81)   Activity Tolerance Patient tolerated treatment well(despite pain, takes his time to be slow and careful)   Patient Left in bed;with call bell/phone within reach   Nurse Communication          Time: 1700-1749 OT Time Calculation (min): 20 min  Charges: OT General Charges $OT Visit: 1 Visit OT Treatments $Self Care/Home Management : 8-22 mins  [place], OTR/L [phone] 11/22/2017

## 2017-11-23 MED ORDER — HYDROCHLOROTHIAZIDE 25 MG PO TABS
25.0000 mg | ORAL_TABLET | Freq: Every day | ORAL | Status: DC
  Administered 2017-11-23 – 2017-11-26 (×4): 25 mg via ORAL
  Filled 2017-11-23 (×4): qty 1

## 2017-11-23 MED ORDER — ENOXAPARIN SODIUM 40 MG/0.4ML ~~LOC~~ SOLN
40.0000 mg | SUBCUTANEOUS | Status: DC
  Administered 2017-11-24 – 2017-11-26 (×3): 40 mg via SUBCUTANEOUS
  Filled 2017-11-23 (×3): qty 0.4

## 2017-11-23 MED ORDER — KETOROLAC TROMETHAMINE 15 MG/ML IJ SOLN
15.0000 mg | Freq: Four times a day (QID) | INTRAMUSCULAR | Status: DC
  Administered 2017-11-23 – 2017-11-26 (×13): 15 mg via INTRAVENOUS
  Filled 2017-11-23 (×13): qty 1

## 2017-11-23 MED ORDER — GABAPENTIN 300 MG PO CAPS
300.0000 mg | ORAL_CAPSULE | Freq: Three times a day (TID) | ORAL | Status: DC
  Administered 2017-11-23 – 2017-11-25 (×9): 300 mg via ORAL
  Filled 2017-11-23 (×10): qty 1

## 2017-11-23 MED ORDER — METOPROLOL SUCCINATE ER 100 MG PO TB24
100.0000 mg | ORAL_TABLET | Freq: Every day | ORAL | Status: DC
  Administered 2017-11-23 – 2017-11-25 (×3): 100 mg via ORAL
  Filled 2017-11-23 (×3): qty 1

## 2017-11-23 NOTE — Progress Notes (Signed)
Physical Therapy Treatment Patient Details Name: Tony [name] MRN: [id number] DOB: [birth year] Today's Date: 11/23/2017    History of Present Illness Tony [name] is a 52 y.o. black male s/p aortobifemoral BPG with RLE fempop BPG 11-15-17 . PMHx: HTN, IBS, HLD, PVD, Depression, Anxiety, Chronic back pain, knee pain and GERD.     PT Comments    Pt sitting EOB on arrival with LE's dangling and R LE swollen. Pt educated on elevating LE and mobility to reduce edema. Pt ambulated in hall with RW and supervision for safety. Pt avoiding WB on R LE secondary to pain. Will continue to follow acutely to maximize functional independence and safety with mobility.    Follow Up Recommendations  Home health PT;Supervision/Assistance - 24 hour     Equipment Recommendations  Rolling walker with 5" wheels    Recommendations for Other Services OT consult     Precautions / Restrictions Precautions Precautions: Fall Precaution Comments: Monitor limb edema, scrotal edema.  Restrictions Weight Bearing Restrictions: No    Mobility  Bed Mobility Overal bed mobility: Independent             General bed mobility comments: sitting EOB on arrival with feet dangling  Transfers Overall transfer level: Needs assistance Equipment used: Rolling walker (2 wheeled) Transfers: Sit to/from Stand Sit to Stand: Modified independent (Device/Increase time)         General transfer comment: slow, and cautious, limited by pain in foot.  Ambulation/Gait Ambulation/Gait assistance: Supervision Gait Distance (Feet): 300 Feet Assistive device: Rolling walker (2 wheeled) Gait Pattern/deviations: Step-through pattern;Decreased stance time - right;Decreased stride length;Trunk flexed Gait velocity: decr   General Gait Details: pt avoiding WB on R LE secondary to pain. Cues for postural control and increased knee flexion.   Stairs             Wheelchair Mobility    Modified Rankin (Stroke  Patients Only)       Balance                                            Cognition Arousal/Alertness: Awake/alert Behavior During Therapy: WFL for tasks assessed/performed(is wondering what will happen with his foot) Overall Cognitive Status: Within Functional Limits for tasks assessed                                        Exercises      General Comments General comments (skin integrity, edema, etc.): Pt with LE edema found sitting EOB with LEs dangling. Educated pt on elevating LEs to reduce swelling.       Pertinent Vitals/Pain Pain Assessment: 0-10 Pain Score: 8  Pain Location: rt foot when in dependent position Pain Descriptors / Indicators: Other (Comment)(tingling when sitting in bed; throbbing and burning when foo) Pain Intervention(s): Limited activity within patient's tolerance;Repositioned;Monitored during session    Home Living                      Prior Function            PT Goals (current goals can now be found in the care plan section) Acute Rehab PT Goals Patient Stated Goal: none stated by patient PT Goal Formulation: With patient/family Time For Goal Achievement: 11/30/17 Potential to Achieve Goals:  Good Progress towards PT goals: Progressing toward goals    Frequency    Min 3X/week      PT Plan Current plan remains appropriate    Co-evaluation              AM-PAC PT "6 Clicks" Daily Activity  Outcome Measure  Difficulty turning over in bed (including adjusting bedclothes, sheets and blankets)?: None Difficulty moving from lying on back to sitting on the side of the bed? : None Difficulty sitting down on and standing up from a chair with arms (e.g., wheelchair, bedside commode, etc,.)?: None Help needed moving to and from a bed to chair (including a wheelchair)?: A Little Help needed walking in hospital room?: A Little Help needed climbing 3-5 steps with a railing? : A Little 6 Click  Score: 21    End of Session Equipment Utilized During Treatment: Gait belt Activity Tolerance: Patient tolerated treatment well;Patient limited by pain Patient left: with call bell/phone within reach;in chair;Other (comment)(LEs elevated) Nurse Communication: Mobility status;Patient requests pain meds PT Visit Diagnosis: Other abnormalities of gait and mobility (R26.89);Pain Pain - Right/Left: Right Pain - part of body: Ankle and joints of foot     Time: 1429-1450 PT Time Calculation (min) (ACUTE ONLY): 21 min  Charges:  $Gait Training: 8-22 mins                    G Codes:       Benjiman Core, [place] Pager [phone] Acute Rehab   Allena Katz 11/23/2017, 3:05 PM

## 2017-11-23 NOTE — Progress Notes (Addendum)
  Progress Note    11/23/2017 7:33 AM 8 Days Post-Op  Subjective:  Pain R foot   Vitals:   11/23/17 0539 11/23/17 0551  BP: (!) 179/86 (!) 168/101  Pulse:  88  Resp:  20  Temp:  98.9 F (37.2 C)  SpO2:  97%   Physical Exam: Cardiac: RRR Lungs:  Non labored on RA Incisions:  abd incision stable; B groin incisions stable; RLE incision c/d/i Extremities:  PTA and DP by doppler; R foot sensitive to touch; toes of R foot discolored and ischemic Abdomen:  Soft, non tender Neurologic: A&O  CBC    Component Value Date/Time   WBC 9.6 11/21/2017 0658   RBC 3.59 (L) 11/21/2017 0658   HGB 10.6 (L) 11/21/2017 0658   HCT 32.9 (L) 11/21/2017 0658   PLT 354 11/21/2017 0658   MCV 91.6 11/21/2017 0658   MCH 29.5 11/21/2017 0658   MCHC 32.2 11/21/2017 0658   RDW 13.0 11/21/2017 0658   LYMPHSABS 1.9 11/13/2017 0511   MONOABS 0.9 11/13/2017 0511   EOSABS 0.8 (H) 11/13/2017 0511   BASOSABS 0.1 11/13/2017 0511    BMET    Component Value Date/Time   NA 139 11/19/2017 0500   K 3.2 (L) 11/19/2017 0500   CL 109 11/19/2017 0500   CO2 24 11/19/2017 0500   GLUCOSE 136 (H) 11/19/2017 0500   BUN 9 11/19/2017 0500   CREATININE 0.92 11/22/2017 0428   CALCIUM 7.9 (L) 11/19/2017 0500   GFRNONAA >60 11/22/2017 0428   GFRAA >60 11/22/2017 0428    INR    Component Value Date/Time   INR 1.01 11/15/2017 1638     Intake/Output Summary (Last 24 hours) at 11/23/2017 0733 Last data filed at 11/23/2017 0556 Gross per 24 hour  Intake 600 ml  Output 1550 ml  Net -950 ml     Assessment/Plan:  52 y.o. male is s/p ABF and R fem-pop bypass with vein 8 Days Post-Op   Patent bypass with peripheral signals by doppler RLE Ischemic toes; allowing time for demarcation toradol and neurontin added Home when pain controlled  DVT prophylaxis:  lovenox   Dagoberto Ligas, PA-C [facility] [phone] 11/23/2017 7:33 AM   Addendum  I have independently interviewed and  examined the patient, and I agree with the physician assistant's findings.  Pain still an issue as pt still requiring IV dilaudid.  Add Toradol 15 mg IV q6 prn pain and Neurontin 300 mg PO tid.   Adele Barthel, MD, FACS [facility] [place] Office: [phone] Pager: [phone]  11/23/2017, 8:45 AM

## 2017-11-24 ENCOUNTER — Inpatient Hospital Stay (HOSPITAL_COMMUNITY): Payer: BLUE CROSS/BLUE SHIELD

## 2017-11-24 DIAGNOSIS — M7989 Other specified soft tissue disorders: Secondary | ICD-10-CM

## 2017-11-24 LAB — CBC
HEMATOCRIT: 31.8 % — AB (ref 39.0–52.0)
HEMOGLOBIN: 10.1 g/dL — AB (ref 13.0–17.0)
MCH: 29.9 pg (ref 26.0–34.0)
MCHC: 31.8 g/dL (ref 30.0–36.0)
MCV: 94.1 fL (ref 78.0–100.0)
Platelets: 433 10*3/uL — ABNORMAL HIGH (ref 150–400)
RBC: 3.38 MIL/uL — ABNORMAL LOW (ref 4.22–5.81)
RDW: 13.2 % (ref 11.5–15.5)
WBC: 8.3 10*3/uL (ref 4.0–10.5)

## 2017-11-24 LAB — BASIC METABOLIC PANEL
ANION GAP: 7 (ref 5–15)
BUN: 7 mg/dL (ref 6–20)
CALCIUM: 8.2 mg/dL — AB (ref 8.9–10.3)
CHLORIDE: 107 mmol/L (ref 101–111)
CO2: 26 mmol/L (ref 22–32)
Creatinine, Ser: 0.95 mg/dL (ref 0.61–1.24)
GFR calc non Af Amer: >60 mL/min (ref >60)
Glucose, Bld: 124 mg/dL — ABNORMAL HIGH (ref 65–99)
Potassium: 3.8 mmol/L (ref 3.5–5.1)
Sodium: 140 mmol/L (ref 135–145)

## 2017-11-24 NOTE — Progress Notes (Signed)
  Progress Note    11/24/2017 9:15 AM 9 Days Post-Op  Subjective:  C/o pain in his toes on the right foot; says he feels like it is worse when he walks; c/o pain in his leg up to his thigh  Afebrile HR 60's-120's NSR/ST [id number]'O systolic 671% RA  Vitals:   11/24/17 0000 11/24/17 0406  BP: (!) 168/106 (!) 156/108  Pulse: 69 94  Resp: 20 16  Temp: 98.5 F (36.9 C) 98 F (36.7 C)  SpO2: 96% 93%    Physical Exam: Cardiac:  Regular  Lungs:  Non labored Incisions:  Laparotomy incision and bilateral groin incisions are clean and dry  Extremities:  Monophasic DP doppler signals bilaterally; brisk right PT doppler signal > left; bilateral peroneal doppler signals; significant swelling right leg/foot.  Abdomen:  Soft; +BS; +BM  CBC    Component Value Date/Time   WBC 8.3 11/24/2017 0213   RBC 3.38 (L) 11/24/2017 0213   HGB 10.1 (L) 11/24/2017 0213   HCT 31.8 (L) 11/24/2017 0213   PLT 433 (H) 11/24/2017 0213   MCV 94.1 11/24/2017 0213   MCH 29.9 11/24/2017 0213   MCHC 31.8 11/24/2017 0213   RDW 13.2 11/24/2017 0213   LYMPHSABS 1.9 11/13/2017 0511   MONOABS 0.9 11/13/2017 0511   EOSABS 0.8 (H) 11/13/2017 0511   BASOSABS 0.1 11/13/2017 0511    BMET    Component Value Date/Time   NA 140 11/24/2017 0213   K 3.8 11/24/2017 0213   CL 107 11/24/2017 0213   CO2 26 11/24/2017 0213   GLUCOSE 124 (H) 11/24/2017 0213   BUN 7 11/24/2017 0213   CREATININE 0.95 11/24/2017 0213   CALCIUM 8.2 (L) 11/24/2017 0213   GFRNONAA >60 11/24/2017 0213   GFRAA >60 11/24/2017 0213    INR    Component Value Date/Time   INR 1.01 11/15/2017 1638     Intake/Output Summary (Last 24 hours) at 11/24/2017 0915 Last data filed at 11/24/2017 0709 Gross per 24 hour  Intake 573 ml  Output 725 ml  Net -152 ml     Assessment:  52 y.o. male is s/p:  1. Aortobifemoral bypass with 14 x 7 mm Dacron graft 2. Right femoral to above-knee popliteal artery bypass with non-reversed translocated  saphenous vein graft 3. Intraoperative arteriogram  9 Days Post-Op   Plan: -pt doing well with doppler signals bilateral feet but still with ischemic toes right foot with pain.  May eventually need trans-metarsal amputation but does have considerable swelling and pain in right leg.  Doubt DVT given he is on Lovenox, but will get venous duplex to confirm -DVT prophylaxis:  Lovenox -leg elevation as tolerated.  -encourage nutrition -continue mobilization.   Leontine Locket, PA-C [facility] [phone] 11/24/2017 9:15 AM

## 2017-11-24 NOTE — Progress Notes (Signed)
  Progress Note    11/24/2017 11:40 AM 9 Days Post-Op  Subjective:  Right foot pain persists  Vitals:   11/24/17 0406 11/24/17 0920  BP: (!) 156/108 (!) 142/82  Pulse: 94 (!) 39  Resp: 16 12  Temp: 98 F (36.7 C) 97.8 F (36.6 C)  SpO2: 93% 100%    Physical Exam: aaox3 Non labored respirations Abdomen is soft Incisions cdi Right leg has edema Right toes ischemic appearing and tender to touch  CBC    Component Value Date/Time   WBC 8.3 11/24/2017 0213   RBC 3.38 (L) 11/24/2017 0213   HGB 10.1 (L) 11/24/2017 0213   HCT 31.8 (L) 11/24/2017 0213   PLT 433 (H) 11/24/2017 0213   MCV 94.1 11/24/2017 0213   MCH 29.9 11/24/2017 0213   MCHC 31.8 11/24/2017 0213   RDW 13.2 11/24/2017 0213   LYMPHSABS 1.9 11/13/2017 0511   MONOABS 0.9 11/13/2017 0511   EOSABS 0.8 (H) 11/13/2017 0511   BASOSABS 0.1 11/13/2017 0511    BMET    Component Value Date/Time   NA 140 11/24/2017 0213   K 3.8 11/24/2017 0213   CL 107 11/24/2017 0213   CO2 26 11/24/2017 0213   GLUCOSE 124 (H) 11/24/2017 0213   BUN 7 11/24/2017 0213   CREATININE 0.95 11/24/2017 0213   CALCIUM 8.2 (L) 11/24/2017 0213   GFRNONAA >60 11/24/2017 0213   GFRAA >60 11/24/2017 0213    INR    Component Value Date/Time   INR 1.01 11/15/2017 1638     Intake/Output Summary (Last 24 hours) at 11/24/2017 1140 Last data filed at 11/24/2017 0709 Gross per 24 hour  Intake 573 ml  Output 725 ml  Net -152 ml     Assessment/Plan:  52 y.o. male is s/p ABF and R fem-pop bypass with vein 9 Days Post-Op  Patent bypass with peripheral signals by doppler RLE Ischemic toes; allowing time for demarcation-will possibly need amputation at some point but would wait with current edema DVT prophylaxis:  lovenox   Tyliek Timberman C. Donzetta Matters, MD [facility] [place] Office: [phone] Pager: [phone]  11/24/2017 11:40 AM

## 2017-11-24 NOTE — Plan of Care (Signed)
  Problem: Education: Goal: Knowledge of General Education information will improve Outcome: Progressing   Problem: Clinical Measurements: Goal: Will remain free from infection Outcome: Progressing Goal: Cardiovascular complication will be avoided Outcome: Progressing   Problem: Pain Managment: Goal: General experience of comfort will improve Outcome: Progressing   Problem: Skin Integrity: Goal: Risk for impaired skin integrity will decrease Outcome: Progressing

## 2017-11-24 NOTE — Plan of Care (Signed)
  Problem: Pain Managment: Goal: General experience of comfort will improve Outcome: Progressing  Patient has had increased pain this evening. Created a pain management schedule with him to keep right foot pain under control.

## 2017-11-24 NOTE — Progress Notes (Signed)
Preliminary notes--Right lower extremity venous duplex exam completed. Negative for DVT where exam could approach.  1,Questionable heterogenous structures with vascularity feature seen at groin area,  suspected hematoma?  2, Complex fluid collectin with irregular border and internal echoes noted above knee medially.   Hongying Jamol Ginyard (RDMS RVT) 11/24/17 3:00 PM

## 2017-11-25 NOTE — Plan of Care (Signed)
  Problem: Education: Goal: Knowledge of General Education information will improve Outcome: Progressing   Problem: Pain Managment: Goal: General experience of comfort will improve Outcome: Progressing   Problem: Skin Integrity: Goal: Demonstration of wound healing without infection will improve Outcome: Progressing

## 2017-11-25 NOTE — Progress Notes (Addendum)
  Progress Note    11/25/2017 9:37 AM 10 Days Post-Op  Subjective:  C/o pain in his right toes  Tm 99 now afebrile VSS  Vitals:   11/25/17 0013 11/25/17 0930  BP: (!) 150/86 (!) 161/98  Pulse:    Resp: 17 (!) 24  Temp: 99 F (37.2 C) 98.5 F (36.9 C)  SpO2: 99% 98%    Physical Exam: Cardiac:  regular Lungs:  Non labored Extremities:  Ischemic toes 1-4 on right foot with RLE edema onto the foot.    CBC    Component Value Date/Time   WBC 8.3 11/24/2017 0213   RBC 3.38 (L) 11/24/2017 0213   HGB 10.1 (L) 11/24/2017 0213   HCT 31.8 (L) 11/24/2017 0213   PLT 433 (H) 11/24/2017 0213   MCV 94.1 11/24/2017 0213   MCH 29.9 11/24/2017 0213   MCHC 31.8 11/24/2017 0213   RDW 13.2 11/24/2017 0213   LYMPHSABS 1.9 11/13/2017 0511   MONOABS 0.9 11/13/2017 0511   EOSABS 0.8 (H) 11/13/2017 0511   BASOSABS 0.1 11/13/2017 0511    BMET    Component Value Date/Time   NA 140 11/24/2017 0213   K 3.8 11/24/2017 0213   CL 107 11/24/2017 0213   CO2 26 11/24/2017 0213   GLUCOSE 124 (H) 11/24/2017 0213   BUN 7 11/24/2017 0213   CREATININE 0.95 11/24/2017 0213   CALCIUM 8.2 (L) 11/24/2017 0213   GFRNONAA >60 11/24/2017 0213   GFRAA >60 11/24/2017 0213    INR    Component Value Date/Time   INR 1.01 11/15/2017 1638     Intake/Output Summary (Last 24 hours) at 11/25/2017 0937 Last data filed at 11/25/2017 0000 Gross per 24 hour  Intake 460 ml  Output 200 ml  Net 260 ml   Venous duplex Preliminary notes--Right lower extremity venous duplex exam completed. Negative for DVT where exam could approach.  1,Questionable heterogenous structures with vascularity feature seen at groin area,  suspected hematoma?  2, Complex fluid collectin with irregular border and internal echoes noted above knee medially.    Assessment:  52 y.o. male is s/p:  Aortobifemoral bypass grafting   10 Days Post-Opa  Plan: -pt continues to have ischemic, painful toes as well as swelling in his RLE.   Continue demarcation.  May need transmet amp in the future after swelling resolves.  RLE venous duplex yesterday revealed no DVT -DVT prophylaxis:  Lovenox -continue mobilization/leg elevation when not walking if tolerated and encourage nutrition.    Leontine Locket, PA-C [facility] [phone] 11/25/2017 9:37 AM   I have interviewed and examined patient with PA and agree with assessment and plan above.   Kassadi Presswood C. Donzetta Matters, MD [facility] [place] Office: [phone] Pager: [phone]

## 2017-11-25 NOTE — Progress Notes (Signed)
Spoke with Leontine Locket, PA concerning patient wishes to be able to leave unit for a brief period with spouse due to severe feeling of depression related to health concerns/update. PA gave orders to allow patient to leave unit for brief period (30 minutes) with spouse. Soke with spouse Designer, industrial/product) concerning all of the above information and update on patient mood today. Spouse agreeable to take patient off of unit and is aware of time limit. Spouse on route to hospital at this time to visit with patient.

## 2017-11-26 MED ORDER — GABAPENTIN 300 MG PO CAPS: 300.0000 mg | ORAL_CAPSULE | Freq: Three times a day (TID) | ORAL | 1 refills | Status: DC

## 2017-11-26 MED ORDER — OXYCODONE-ACETAMINOPHEN 5-325 MG PO TABS: 1.0000 | ORAL_TABLET | ORAL | 0 refills | Status: DC | PRN

## 2017-11-26 MED ORDER — ATORVASTATIN CALCIUM 40 MG PO TABS: 40.0000 mg | ORAL_TABLET | Freq: Every day | ORAL | 1 refills | Status: DC

## 2017-11-26 NOTE — Progress Notes (Signed)
.      Durable Medical Equipment  (From admission, onward)        Start     Ordered   11/26/17 0744  For home use only DME Walker rolling  Once    Question:  Patient needs a walker to treat with the following condition  Answer:  Post-operative state   11/26/17 0743   11/26/17 0742  For home use only DME Hospital bed  Once    Question Answer Comment  Patient has (list medical condition): edema and needs assistants with elevation   The above medical condition requires: Patient requires the ability to reposition frequently   Head must be elevated greater than: Other see comments   Bed type Semi-electric      11/26/17 0743   11/22/17 1516  For home use only DME 3 n 1  Once     11/22/17 1515   11/21/17 1302  For home use only DME Walker rolling  Once    Question:  Patient needs a walker to treat with the following condition  Answer:  Weakness   11/21/17 1301

## 2017-11-26 NOTE — Progress Notes (Signed)
Pt/family given discharge instructions, medication lists, follow up appointments, and when to call the doctor.  Pt/family verbalizes understanding. Pt given signs and symptoms of infection. Pt and wife given importance of extremity elevation (and correct placement above heart) in great detail. Pt needs reenforcement with pain management and care. All questions answered. Payton Emerald, RN

## 2017-11-26 NOTE — Progress Notes (Signed)
   VASCULAR SURGERY ASSESSMENT & PLAN:   11 Days Post-Op s/p: Aortofemoral bypass graft and right femoral to above-knee popliteal bypass with vein  He is ambulating and should be ready for discharge possibly today if he is no longer requiring intravenous pain medicine.  He has had some right leg swelling but his venous duplex scan was negative for DVT.  We have discussed the importance of elevating his leg after discharge.  I have explained that his toe pain will likely continue for several months.  He likely has small vessel disease and given his multilevel arterial occlusive disease this resulted in his presenting symptoms.  I can follow this as an outpatient.    SUBJECTIVE:   Pain better controlled.  PHYSICAL EXAM:   Vitals:   11/25/17 1607 11/25/17 2134 11/25/17 2325 11/26/17 0440  BP: (!) 165/96 (!) 158/91 (!) 144/80 (!) 156/89  Pulse: (!) 116 89  68  Resp: 15 13 16 15   Temp: 98.8 F (37.1 C) 98.6 F (37 C) 98.6 F (37 C) 99.4 F (37.4 C)  TempSrc: Oral Oral Oral Oral  SpO2: 100% 96% 96% 95%  Weight:      Height:       Brisk posterior tibial signals both feet. Moderate right lower extremity swelling.   LABS:   Lab Results  Component Value Date   WBC 8.3 11/24/2017   HGB 10.1 (L) 11/24/2017   HCT 31.8 (L) 11/24/2017   MCV 94.1 11/24/2017   PLT 433 (H) 11/24/2017   Lab Results  Component Value Date   CREATININE 0.95 11/24/2017   Lab Results  Component Value Date   INR 1.01 11/15/2017    PROBLEM LIST:    Principal Problem:   Pain of right lower extremity due to ischemia Active Problems:   SMOKER   Depression with anxiety   Essential hypertension   Hyperlipidemia LDL goal <70   Aortoiliac occlusive disease (HCC)   CURRENT MEDS:   . atorvastatin  40 mg Oral Daily  . docusate sodium  100 mg Oral Daily  . enoxaparin (LOVENOX) injection  40 mg Subcutaneous Q24H  . famotidine  20 mg Oral BID  . gabapentin  300 mg Oral TID  . hydrochlorothiazide   25 mg Oral Daily  . ketorolac  15 mg Intravenous Q6H  . metoprolol succinate  100 mg Oral QHS  . nicotine  14 mg Transdermal Daily  . pantoprazole  40 mg Oral Daily  . sodium chloride flush  3 mL Intravenous Q12H   Deitra Mayo Beeper: [phone] Office: [phone] 11/26/2017

## 2017-11-26 NOTE — Progress Notes (Signed)
Progress Note for the Evaluation of Need for a Hospital Bed Patient Name: __[name]___________________________________        DOB:__25-Nov-1967______________________________ Diagnosis Codes: _I74.09, I99.8, I73.9___           Height: __6'1___   Weight:__190 lbs________  \    Other Health Conditions  Patient had Aortofemoral bypass graft and right femoral to above-knee popliteal bypass which requires __lower extremity__ to be positioned in ways not feasible with a normal bed. Lower extremities must be repositioned or elevated frequently to reduce swelling, edema and clots which cannot be achieved with normal bed.     Clinician Signature/Credentials:___Alesia Marcheta Grammes RN CCM __

## 2017-11-26 NOTE — Progress Notes (Signed)
Discharge Planning: please see previous NCM notes. Contacted [facility] to make aware of scheduled dc home today. Contacted [facility] for delivery of RW and 3n1 to room and hospital bed will be delivered to home. Jonnie Finner RN CCM Case Mgmt phone [phone]

## 2017-11-28 ENCOUNTER — Ambulatory Visit (INDEPENDENT_AMBULATORY_CARE_PROVIDER_SITE_OTHER): Payer: Self-pay | Admitting: Vascular Surgery

## 2017-11-28 ENCOUNTER — Other Ambulatory Visit: Payer: Self-pay

## 2017-11-28 ENCOUNTER — Encounter: Payer: Self-pay | Admitting: Vascular Surgery

## 2017-11-28 VITALS — BP 169/100 | HR 78 | Temp 99.1°F | Resp 20 | Ht 73.0 in | Wt 180.0 lb

## 2017-11-28 DIAGNOSIS — Z48812 Encounter for surgical aftercare following surgery on the circulatory system: Secondary | ICD-10-CM

## 2017-11-28 MED ORDER — OXYCODONE-ACETAMINOPHEN 5-325 MG PO TABS: 1.0000 | ORAL_TABLET | Freq: Four times a day (QID) | ORAL | 0 refills | Status: DC | PRN

## 2017-11-28 NOTE — Progress Notes (Signed)
Patient name: Tony [name] MRN: [id number] DOB: 01/02/1966 Sex: male  REASON FOR VISIT:   Follow-up after aortobifemoral bypass and right femoropopliteal bypass.  HPI:   Tony [name] is a pleasant 52 y.o. male who had presented with disabling pain in his right foot and was found to have multilevel arterial occlusive disease.  He underwent an aortobifemoral bypass and a right femoral to above-knee popliteal artery bypass with vein on 11/15/2017.  He comes in today for a follow-up visit.  His main complaint is some swelling in the right leg.  He did undergo a venous duplex scan when he was in the hospital which showed no evidence of DVT.  His main complaint is pain in the toes of the right foot which are demarcating.  Current Outpatient Medications  Medication Sig Dispense Refill  . atorvastatin (LIPITOR) 40 MG tablet Take 1 tablet (40 mg total) by mouth daily. 30 tablet 1  . gabapentin (NEURONTIN) 100 MG capsule Take 2 capsules (200 mg total) by mouth 2 (two) times daily. 60 capsule 2  . gabapentin (NEURONTIN) 300 MG capsule Take 1 capsule (300 mg total) by mouth 3 (three) times daily. 90 capsule 1  . hydrochlorothiazide (HYDRODIURIL) 25 MG tablet Take 25 mg by mouth daily.    . metoprolol succinate (TOPROL-XL) 100 MG 24 hr tablet Take 100 mg by mouth at bedtime. Take with or immediately following a meal.    . oxyCODONE-acetaminophen (PERCOCET/ROXICET) 5-325 MG tablet Take 1-2 tablets by mouth every 4 (four) hours as needed for severe pain. 40 tablet 0  . valsartan (DIOVAN) 160 MG tablet Take 160 mg by mouth daily.  4   No current facility-administered medications for this visit.     REVIEW OF SYSTEMS:  [X]  denotes positive finding, [ ]  denotes negative finding Cardiac  Comments:  Chest pain or chest pressure:    Shortness of breath upon exertion:    Short of breath when lying flat:    Irregular heart rhythm:    Constitutional    Fever or chills:     PHYSICAL EXAM:    Vitals:   11/28/17 1432  BP: (!) 169/100  Pulse: 78  Resp: 20  Temp: 99.1 F (37.3 C)  TempSrc: Oral  SpO2: 93%  Weight: 180 lb (81.6 kg)  Height: 6\' 1"  (1.854 m)    GENERAL: The patient is a well-nourished male, in no acute distress. The vital signs are documented above. CARDIOVASCULAR: There is a regular rate and rhythm. PULMONARY: There is good air exchange bilaterally without wheezing or rales. VASCULAR: He has brisk Doppler signals in both feet and the posterior tibial dorsalis pedis and peroneal positions. His toes on the right foot have ischemic changes in her slowly demarcating. He has significant right lower extremity swelling and right foot swelling.  DATA:   No new data  MEDICAL ISSUES:   STATUS POST AORTOFEMORAL BYPASS AND RIGHT FEMOROPOPLITEAL BYPASS: He is continuing to have pain from his ischemic toes.  I have explained that this will take some time but should gradually improve.  I have written a prescription for 30 Percocet for pain.  At this point I would be reluctant to consider transmetatarsal amputation given the amount of swelling in his toes.  In addition the toes may gradually improve now that he has good blood flow.  He will keep his regularly scheduled appointment in July.  He knows to call sooner if he has problems.  He is on a statin.  I  have asked him to begin taking aspirin daily.  Deitra Mayo [facility] [phone]

## 2017-11-28 NOTE — Discharge Summary (Signed)
[facility] Discharge Summary   Patient ID:  Tony [name] MRN: [id number] DOB/AGE: May 09, 1966 52 y.o.  Admit date: 11/13/2017 Discharge date: 11/26/2017 Date of Surgery: 11/15/2017 Surgeon: Surgeon(s): Angelia Mould, MD Conrad Anthem, MD  Admission Diagnosis: PVD (peripheral vascular disease) [facility]) [I73.9] Preop cardiovascular exam [Z01.810] Lower limb ischemia [I99.8] Ischemic rest pain of lower extremity ([place]) [I73.9]  Discharge Diagnoses:  PVD (peripheral vascular disease) ([place]) [I73.9] Preop cardiovascular exam [Q67.619] Lower limb ischemia [I99.8] Ischemic rest pain of lower extremity ([place]) [I73.9]  Secondary Diagnoses: Past Medical History:  Diagnosis Date  . Anxiety   . Arterial occlusive disease    multilevel arterial occlusive disease/notes 11/13/2017  . Arthritis    "left knee" (11/13/2017)  . Chronic back pain   . Chronic lower back pain   . Depression   . Gastric ulcer due to Helicobacter pylori 5093  . GERD (gastroesophageal reflux disease)   . High cholesterol   . Hypertension   . IBS (irritable bowel syndrome)   . Mesenteric adenitis 2011   presumed/H&P  . Migraines    "use to suffer migraines years ago" (11/13/2017)  . Peripheral vascular disease ([place])   . Pneumonia 2011  . Ulcer     Procedure(s): AORTA BIFEMORAL BYPASS GRAFT Right FEMORAL-Above knee POPLITEAL ARTERY Bypass Graft using nonreversed saphenous vein  INTRA OPERATIVE ARTERIOGRAM  Discharged Condition: stable  HPI: 52 y/o male Who first presented to [facility] 6/2/2019with a CC of right foot pain with coolness to touch for the past 3 days.   On inital exam he did not appear to have an acutely ischemic right lower extremity.  He had a reasonable Doppler signal in the posterior tibial position on the right.  He does have evidence of multilevel arterial occlusive disease with a diminished right femoral pulse and an absent left femoral pulse.  He has monophasic Doppler  signals in both feet.   Dr. Scot Dock reviewed his CT angiogram which shows on the right side, which is the symptomatic side, he has moderate disease of the common and external iliac artery with occlusion of the superficial femoral artery and reconstitution of the popliteal artery with two-vessel runoff via the posterior tibial and peroneal arteries.  This appears to be chronic.  On the right side he has an occluded common and external iliac artery with reconstitution of the common femoral artery and a superficial femoral artery occlusion.  Likewise he has two-vessel runoff on the left.  He has no evidence of an acute arterial occlusion.  He was scheduled for preoperative cardiac clearance and scheduled for aortofemoral bypass grafting with Dr. Scot Dock.  He was discharged home 11/11/2017.   His echo looks good.  Systolic function was normal with an estimated ejection fraction of 60 to 65%.  As per the discussion with Dr. Acie Fredrickson no further cardiac work-up is indicated preoperatively.     Hospital Course:  Tony [name] is a 52 y.o. male is S/P Procedure(s): AORTA BIFEMORAL BYPASS GRAFT Right FEMORAL-Above knee POPLITEAL ARTERY Bypass Graft using nonreversed saphenous vein  INTRA OPERATIVE ARTERIOGRAM  Post op course he remain hemodynamically stable, with normal renal function.  NG tube was maintained until bowel function returns.  Pain was controlled with PCA.   ABI's were performed 11/16/2017 Right 0.84 and Left 0.56 which shows improvement from pre-op.    He developed a fever post op day 2 work up included UA and CXR.  Chest x ray showed mild atelectasis and IS was  encouraged.   NG tube was discontinued post op day 4.  Positive BS and he was advanced to a regular diet as tolerates.  By Day 5 he is tolerating PO's and starting to be more mobile.  He continues to have persistent right foot pain and edema.  All toes dusky in color, now appearing ischemic likely a product of chronic small vessel disease.    Pain still an issue as pt still requiring IV dilaudid.  Add Toradol 15 mg IV q6 prn pain and Neurontin 300 mg PO tid.   RLE venous duplex 11/24/2017 revealed no DVT.  After 11 days in the hospital he was in stable condition ambulating with a rolling walker and pain controlled with po pain medication.      Consults:  Treatment Team:  Angelia Mould, MD Conrad [facility], MD  Significant Diagnostic Studies: CBC Lab Results  Component Value Date   WBC 8.3 11/24/2017   HGB 10.1 (L) 11/24/2017   HCT 31.8 (L) 11/24/2017   MCV 94.1 11/24/2017   PLT 433 (H) 11/24/2017    BMET    Component Value Date/Time   NA 140 11/24/2017 0213   K 3.8 11/24/2017 0213   CL 107 11/24/2017 0213   CO2 26 11/24/2017 0213   GLUCOSE 124 (H) 11/24/2017 0213   BUN 7 11/24/2017 0213   CREATININE 0.95 11/24/2017 0213   CALCIUM 8.2 (L) 11/24/2017 0213   GFRNONAA >60 11/24/2017 0213   GFRAA >60 11/24/2017 0213   COAG Lab Results  Component Value Date   INR 1.01 11/15/2017   INR 1.07 11/11/2017     Disposition:  Discharge to :Home Discharge Instructions    Call MD for:  redness, tenderness, or signs of infection (pain, swelling, bleeding, redness, odor or green/yellow discharge around incision site)   Complete by:  As directed    Call MD for:  severe or increased pain, loss or decreased feeling  in affected limb(s)   Complete by:  As directed    Call MD for:  temperature >100.5   Complete by:  As directed    Resume previous diet   Complete by:  As directed      Allergies as of 11/26/2017      Reactions   Pork-derived Products    UNSPECIFIED REASON Pt doesn't eat pork   Morphine And Related Other (See Comments)   hallucinations      Medication List    STOP taking these medications   traMADol 50 MG tablet Commonly known as:  ULTRAM     TAKE these medications   atorvastatin 40 MG tablet Commonly known as:  LIPITOR Take 1 tablet (40 mg total) by mouth daily. What changed:     medication strength  how much to take   gabapentin 100 MG capsule Commonly known as:  NEURONTIN Take 2 capsules (200 mg total) by mouth 2 (two) times daily. What changed:  Another medication with the same name was added. Make sure you understand how and when to take each.   gabapentin 300 MG capsule Commonly known as:  NEURONTIN Take 1 capsule (300 mg total) by mouth 3 (three) times daily. What changed:  You were already taking a medication with the same name, and this prescription was added. Make sure you understand how and when to take each.   hydrochlorothiazide 25 MG tablet Commonly known as:  HYDRODIURIL Take 25 mg by mouth daily.   metoprolol succinate 100 MG 24 hr tablet Commonly known as:  TOPROL-XL Take 100 mg by mouth at bedtime. Take with or immediately following a meal.   oxyCODONE-acetaminophen 5-325 MG tablet Commonly known as:  PERCOCET/ROXICET Take 1-2 tablets by mouth every 4 (four) hours as needed for severe pain.      Verbal and written Discharge instructions given to the patient. Wound care per Discharge AVS Follow-up Information    Angelia Mould, MD In 3 weeks.   Specialties:  Vascular Surgery, Cardiology Why:  Office will call you to arrange your appt (sent) Contact information: [address] [place] [address] [phone]        [facility] Follow up.   Specialty:  [place] Why:  HHPT/OT arranged- they will call you to set up home visits Contact information: [address] [place] [address] [place] Follow up.   Why:  rolling walker and 3n1 arranged- to be delivered to room prior to discharge Contact information: [address] [place] [address] [phone]           Signed: Roxy Horseman 11/28/2017, 1:37 PM - For VQI Registry use --- Instructions: Press F2 to tab through selections.  Delete question if not applicable.    Post-op:  Wound infection: No  Graft infection: No  Transfusion: Yes  If yes, 2 units given New Arrhythmia: No Ipsilateral amputation: [x ] no, [ ]  Minor, [ ]  BKA, [ ]  AKA Discharge patency: [x ] Primary, [ ]  Primary assisted, [ ]  Secondary, [ ]  Occluded Patency judged by: [x ] Dopper only, [ ]  Palpable graft pulse, [ ]  Palpable distal pulse, [ ]  ABI inc. > 0.15, [ ]  Duplex Discharge ABI: R 0.84, L 0.53 Discharge TBI: R 0.47, L 0.3 D/C Ambulatory Status: Ambulatory with Assistance  Complications: MI: [x ] No, [ ]  Troponin only, [ ]  EKG or Clinical CHF: No Resp failure: x[ ]  none, [ ]  Pneumonia, [ ]  Ventilator Chg in renal function: [x ] none, [ ]  Inc. Cr > 0.5, [ ]  Temp. Dialysis, [ ]  Permanent dialysis Stroke: [x ] None, [ ]  Minor, [ ]  Major Return to OR: No  Reason for return to OR: [ ]  Bleeding, [ ]  Infection, [ ]  Thrombosis, [ ]  Revision  Discharge medications: Statin use:  Yes ASA use:  No  for medical reason   Plavix use:  No  for medical reason   Beta blocker use: Yes Coumadin use: No  for medical reason

## 2017-12-01 ENCOUNTER — Emergency Department (HOSPITAL_COMMUNITY): Payer: BLUE CROSS/BLUE SHIELD | Admitting: Certified Registered Nurse Anesthetist

## 2017-12-01 ENCOUNTER — Emergency Department (HOSPITAL_COMMUNITY): Payer: BLUE CROSS/BLUE SHIELD

## 2017-12-01 ENCOUNTER — Encounter (HOSPITAL_COMMUNITY)
Admission: EM | Disposition: A | Discharge status: Skilled Nursing Facility | Payer: Self-pay | Source: Home / Self Care | Attending: Vascular Surgery

## 2017-12-01 ENCOUNTER — Inpatient Hospital Stay (HOSPITAL_COMMUNITY)
Admission: EM | Admit: 2017-12-01 | Discharge: 2017-12-05 | DRG: 271 | Disposition: A | Discharge status: Skilled Nursing Facility | Payer: BLUE CROSS/BLUE SHIELD | Attending: Vascular Surgery | Admitting: Vascular Surgery

## 2017-12-01 DIAGNOSIS — Z9889 Other specified postprocedural states: Secondary | ICD-10-CM

## 2017-12-01 DIAGNOSIS — Z419 Encounter for procedure for purposes other than remedying health state, unspecified: Secondary | ICD-10-CM

## 2017-12-01 DIAGNOSIS — K589 Irritable bowel syndrome without diarrhea: Secondary | ICD-10-CM | POA: Diagnosis present

## 2017-12-01 DIAGNOSIS — I743 Embolism and thrombosis of arteries of the lower extremities: Secondary | ICD-10-CM | POA: Diagnosis present

## 2017-12-01 DIAGNOSIS — Z79891 Long term (current) use of opiate analgesic: Secondary | ICD-10-CM | POA: Diagnosis not present

## 2017-12-01 DIAGNOSIS — Z791 Long term (current) use of non-steroidal anti-inflammatories (NSAID): Secondary | ICD-10-CM | POA: Diagnosis not present

## 2017-12-01 DIAGNOSIS — M79672 Pain in left foot: Secondary | ICD-10-CM

## 2017-12-01 DIAGNOSIS — I739 Peripheral vascular disease, unspecified: Secondary | ICD-10-CM | POA: Diagnosis present

## 2017-12-01 DIAGNOSIS — Z91018 Allergy to other foods: Secondary | ICD-10-CM | POA: Diagnosis not present

## 2017-12-01 DIAGNOSIS — Y832 Surgical operation with anastomosis, bypass or graft as the cause of abnormal reaction of the patient, or of later complication, without mention of misadventure at the time of the procedure: Secondary | ICD-10-CM | POA: Diagnosis present

## 2017-12-01 DIAGNOSIS — K219 Gastro-esophageal reflux disease without esophagitis: Secondary | ICD-10-CM | POA: Diagnosis present

## 2017-12-01 DIAGNOSIS — F329 Major depressive disorder, single episode, unspecified: Secondary | ICD-10-CM | POA: Diagnosis present

## 2017-12-01 DIAGNOSIS — T82868A Thrombosis of vascular prosthetic devices, implants and grafts, initial encounter: Principal | ICD-10-CM | POA: Diagnosis present

## 2017-12-01 DIAGNOSIS — I771 Stricture of artery: Secondary | ICD-10-CM | POA: Diagnosis present

## 2017-12-01 DIAGNOSIS — I10 Essential (primary) hypertension: Secondary | ICD-10-CM | POA: Diagnosis present

## 2017-12-01 DIAGNOSIS — T82898A Other specified complication of vascular prosthetic devices, implants and grafts, initial encounter: Secondary | ICD-10-CM

## 2017-12-01 DIAGNOSIS — F419 Anxiety disorder, unspecified: Secondary | ICD-10-CM | POA: Diagnosis present

## 2017-12-01 DIAGNOSIS — M1712 Unilateral primary osteoarthritis, left knee: Secondary | ICD-10-CM | POA: Diagnosis present

## 2017-12-01 DIAGNOSIS — M545 Low back pain: Secondary | ICD-10-CM | POA: Diagnosis present

## 2017-12-01 DIAGNOSIS — I998 Other disorder of circulatory system: Secondary | ICD-10-CM | POA: Diagnosis present

## 2017-12-01 DIAGNOSIS — Z79899 Other long term (current) drug therapy: Secondary | ICD-10-CM | POA: Diagnosis not present

## 2017-12-01 DIAGNOSIS — Z8249 Family history of ischemic heart disease and other diseases of the circulatory system: Secondary | ICD-10-CM

## 2017-12-01 DIAGNOSIS — Z87891 Personal history of nicotine dependence: Secondary | ICD-10-CM | POA: Diagnosis not present

## 2017-12-01 DIAGNOSIS — I898 Other specified noninfective disorders of lymphatic vessels and lymph nodes: Secondary | ICD-10-CM | POA: Diagnosis not present

## 2017-12-01 DIAGNOSIS — I999 Unspecified disorder of circulatory system: Secondary | ICD-10-CM

## 2017-12-01 DIAGNOSIS — E7800 Pure hypercholesterolemia, unspecified: Secondary | ICD-10-CM | POA: Diagnosis present

## 2017-12-01 DIAGNOSIS — Z885 Allergy status to narcotic agent status: Secondary | ICD-10-CM | POA: Diagnosis not present

## 2017-12-01 DIAGNOSIS — G8929 Other chronic pain: Secondary | ICD-10-CM | POA: Diagnosis present

## 2017-12-01 HISTORY — PX: EMBOLECTOMY: SHX44

## 2017-12-01 HISTORY — PX: FEMORAL-POPLITEAL BYPASS GRAFT: SHX937

## 2017-12-01 HISTORY — PX: INTRAOPERATIVE ARTERIOGRAM: SHX5157

## 2017-12-01 LAB — CBC WITH DIFFERENTIAL/PLATELET
Abs Immature Granulocytes: 0.1 10*3/uL (ref 0.0–0.1)
BASOS ABS: 0.1 10*3/uL (ref 0.0–0.1)
Basophils Relative: 0 %
EOS ABS: 0.1 10*3/uL (ref 0.0–0.7)
EOS PCT: 1 %
HEMATOCRIT: 32.4 % — AB (ref 39.0–52.0)
Hemoglobin: 10.5 g/dL — ABNORMAL LOW (ref 13.0–17.0)
IMMATURE GRANULOCYTES: 0 %
LYMPHS ABS: 0.8 10*3/uL (ref 0.7–4.0)
Lymphocytes Relative: 7 %
MCH: 30.3 pg (ref 26.0–34.0)
MCHC: 32.4 g/dL (ref 30.0–36.0)
MCV: 93.6 fL (ref 78.0–100.0)
Monocytes Absolute: 0.7 10*3/uL (ref 0.1–1.0)
Monocytes Relative: 6 %
NEUTROS PCT: 86 %
Neutro Abs: 10.3 10*3/uL — ABNORMAL HIGH (ref 1.7–7.7)
PLATELETS: 360 10*3/uL (ref 150–400)
RBC: 3.46 MIL/uL — AB (ref 4.22–5.81)
RDW: 13.3 % (ref 11.5–15.5)
WBC: 11.9 10*3/uL — AB (ref 4.0–10.5)

## 2017-12-01 LAB — BASIC METABOLIC PANEL
ANION GAP: 12 (ref 5–15)
BUN: 10 mg/dL (ref 6–20)
CALCIUM: 9 mg/dL (ref 8.9–10.3)
CO2: 24 mmol/L (ref 22–32)
CREATININE: 0.93 mg/dL (ref 0.61–1.24)
Chloride: 103 mmol/L (ref 101–111)
Glucose, Bld: 122 mg/dL — ABNORMAL HIGH (ref 65–99)
Potassium: 3.6 mmol/L (ref 3.5–5.1)
SODIUM: 139 mmol/L (ref 135–145)

## 2017-12-01 LAB — TYPE AND SCREEN
ABO/RH(D): O POS
ANTIBODY SCREEN: NEGATIVE

## 2017-12-01 SURGERY — EMBOLECTOMY
Anesthesia: General | Laterality: Left

## 2017-12-01 MED ORDER — 0.9 % SODIUM CHLORIDE (POUR BTL) OPTIME
TOPICAL | Status: DC | PRN
  Administered 2017-12-01: 2000 mL

## 2017-12-01 MED ORDER — CEFAZOLIN SODIUM-DEXTROSE 2-3 GM-%(50ML) IV SOLR
INTRAVENOUS | Status: DC | PRN
  Administered 2017-12-01 (×2): 2 g via INTRAVENOUS

## 2017-12-01 MED ORDER — CEFAZOLIN SODIUM 1 G IJ SOLR
INTRAMUSCULAR | Status: AC
  Filled 2017-12-01: qty 40

## 2017-12-01 MED ORDER — HYDROMORPHONE HCL 1 MG/ML IJ SOLN
0.2500 mg | INTRAMUSCULAR | Status: DC | PRN
  Administered 2017-12-01 (×4): 0.5 mg via INTRAVENOUS

## 2017-12-01 MED ORDER — HYDROMORPHONE HCL 1 MG/ML IJ SOLN
1.0000 mg | INTRAMUSCULAR | Status: DC | PRN
  Administered 2017-12-01: 1 mg via INTRAVENOUS
  Filled 2017-12-01: qty 1

## 2017-12-01 MED ORDER — SODIUM CHLORIDE 0.9 % IV SOLN
INTRAVENOUS | Status: AC
  Filled 2017-12-01: qty 1.2

## 2017-12-01 MED ORDER — POTASSIUM CHLORIDE CRYS ER 20 MEQ PO TBCR: 20.0000 meq | EXTENDED_RELEASE_TABLET | Freq: Once | ORAL | Status: DC | PRN

## 2017-12-01 MED ORDER — FENTANYL CITRATE (PF) 100 MCG/2ML IJ SOLN
100.0000 ug | Freq: Once | INTRAMUSCULAR | Status: AC
  Administered 2017-12-01: 100 ug via INTRAVENOUS

## 2017-12-01 MED ORDER — SODIUM CHLORIDE 0.9 % IV SOLN: 500.0000 mL | Freq: Once | INTRAVENOUS | Status: DC | PRN

## 2017-12-01 MED ORDER — LABETALOL HCL 5 MG/ML IV SOLN: 10.0000 mg | INTRAVENOUS | Status: DC | PRN

## 2017-12-01 MED ORDER — PHENYLEPHRINE HCL 10 MG/ML IJ SOLN
INTRAVENOUS | Status: DC | PRN
  Administered 2017-12-01: 10 ug/min via INTRAVENOUS

## 2017-12-01 MED ORDER — KCL IN DEXTROSE-NACL 20-5-0.45 MEQ/L-%-% IV SOLN
INTRAVENOUS | Status: DC
  Administered 2017-12-01 – 2017-12-02 (×2): via INTRAVENOUS
  Filled 2017-12-01 (×2): qty 1000

## 2017-12-01 MED ORDER — ALUM & MAG HYDROXIDE-SIMETH 200-200-20 MG/5ML PO SUSP: 15.0000 mL | ORAL | Status: DC | PRN

## 2017-12-01 MED ORDER — CEFAZOLIN SODIUM-DEXTROSE 2-4 GM/100ML-% IV SOLN
2.0000 g | Freq: Three times a day (TID) | INTRAVENOUS | Status: AC
  Administered 2017-12-01 – 2017-12-02 (×2): 2 g via INTRAVENOUS
  Filled 2017-12-01 (×2): qty 100

## 2017-12-01 MED ORDER — HYDROCHLOROTHIAZIDE 25 MG PO TABS
25.0000 mg | ORAL_TABLET | Freq: Every day | ORAL | Status: DC
  Administered 2017-12-02 – 2017-12-05 (×4): 25 mg via ORAL
  Filled 2017-12-01 (×4): qty 1

## 2017-12-01 MED ORDER — LIDOCAINE 2% (20 MG/ML) 5 ML SYRINGE
INTRAMUSCULAR | Status: AC
  Filled 2017-12-01: qty 5

## 2017-12-01 MED ORDER — ACETAMINOPHEN 325 MG RE SUPP: 325.0000 mg | RECTAL | Status: DC | PRN

## 2017-12-01 MED ORDER — MIDAZOLAM HCL 2 MG/2ML IJ SOLN
INTRAMUSCULAR | Status: DC | PRN
  Administered 2017-12-01: 2 mg via INTRAVENOUS

## 2017-12-01 MED ORDER — MELOXICAM 7.5 MG PO TABS
15.0000 mg | ORAL_TABLET | Freq: Every day | ORAL | Status: DC | PRN
  Filled 2017-12-01: qty 2

## 2017-12-01 MED ORDER — SUGAMMADEX SODIUM 200 MG/2ML IV SOLN
INTRAVENOUS | Status: AC
  Filled 2017-12-01: qty 2

## 2017-12-01 MED ORDER — ROCURONIUM BROMIDE 10 MG/ML (PF) SYRINGE
PREFILLED_SYRINGE | INTRAVENOUS | Status: DC | PRN
  Administered 2017-12-01: 10 mg via INTRAVENOUS
  Administered 2017-12-01: 40 mg via INTRAVENOUS
  Administered 2017-12-01 (×3): 10 mg via INTRAVENOUS
  Administered 2017-12-01 (×2): 20 mg via INTRAVENOUS

## 2017-12-01 MED ORDER — MORPHINE SULFATE (PF) 2 MG/ML IV SOLN: 2.0000 mg | INTRAVENOUS | Status: DC | PRN

## 2017-12-01 MED ORDER — SODIUM CHLORIDE 0.9 % IV BOLUS
1000.0000 mL | Freq: Once | INTRAVENOUS | Status: AC
  Administered 2017-12-01: 1000 mL via INTRAVENOUS

## 2017-12-01 MED ORDER — ACETAMINOPHEN 325 MG PO TABS
325.0000 mg | ORAL_TABLET | ORAL | Status: DC | PRN
  Administered 2017-12-03 – 2017-12-05 (×3): 650 mg via ORAL
  Filled 2017-12-01 (×3): qty 2

## 2017-12-01 MED ORDER — HEPARIN SODIUM (PORCINE) 1000 UNIT/ML IJ SOLN
INTRAMUSCULAR | Status: AC
  Filled 2017-12-01: qty 1

## 2017-12-01 MED ORDER — ALBUMIN HUMAN 5 % IV SOLN
INTRAVENOUS | Status: DC | PRN
  Administered 2017-12-01: 12:00:00 via INTRAVENOUS

## 2017-12-01 MED ORDER — HEPARIN SODIUM (PORCINE) 5000 UNIT/ML IJ SOLN
5000.0000 [IU] | Freq: Three times a day (TID) | INTRAMUSCULAR | Status: DC
  Administered 2017-12-01 – 2017-12-03 (×5): 5000 [IU] via SUBCUTANEOUS
  Filled 2017-12-01 (×3): qty 1

## 2017-12-01 MED ORDER — DOCUSATE SODIUM 100 MG PO CAPS
100.0000 mg | ORAL_CAPSULE | Freq: Every day | ORAL | Status: DC
  Administered 2017-12-02 – 2017-12-05 (×3): 100 mg via ORAL
  Filled 2017-12-01 (×5): qty 1

## 2017-12-01 MED ORDER — ONDANSETRON HCL 4 MG/2ML IJ SOLN
4.0000 mg | Freq: Once | INTRAMUSCULAR | Status: DC | PRN
Start: 2017-12-01 — End: 2017-12-01

## 2017-12-01 MED ORDER — HYDROMORPHONE HCL 1 MG/ML IJ SOLN
1.0000 mg | INTRAMUSCULAR | Status: DC | PRN
  Administered 2017-12-01 – 2017-12-05 (×28): 1 mg via INTRAVENOUS
  Filled 2017-12-01 (×28): qty 1

## 2017-12-01 MED ORDER — METOPROLOL SUCCINATE ER 100 MG PO TB24
100.0000 mg | ORAL_TABLET | Freq: Every day | ORAL | Status: DC
  Administered 2017-12-01 – 2017-12-04 (×4): 100 mg via ORAL
  Filled 2017-12-01 (×4): qty 1

## 2017-12-01 MED ORDER — MIDAZOLAM HCL 2 MG/2ML IJ SOLN
INTRAMUSCULAR | Status: AC
  Filled 2017-12-01: qty 2

## 2017-12-01 MED ORDER — HEPARIN SODIUM (PORCINE) 1000 UNIT/ML IJ SOLN
INTRAMUSCULAR | Status: DC | PRN
  Administered 2017-12-01 (×2): 4000 [IU] via INTRAVENOUS
  Administered 2017-12-01: 8000 [IU] via INTRAVENOUS

## 2017-12-01 MED ORDER — METOPROLOL TARTRATE 5 MG/5ML IV SOLN: 2.0000 mg | INTRAVENOUS | Status: DC | PRN

## 2017-12-01 MED ORDER — FENTANYL CITRATE (PF) 250 MCG/5ML IJ SOLN
INTRAMUSCULAR | Status: AC
  Filled 2017-12-01: qty 5

## 2017-12-01 MED ORDER — ONDANSETRON HCL 4 MG/2ML IJ SOLN
INTRAMUSCULAR | Status: DC | PRN
  Administered 2017-12-01: 4 mg via INTRAVENOUS

## 2017-12-01 MED ORDER — IRBESARTAN 75 MG PO TABS
37.5000 mg | ORAL_TABLET | Freq: Every day | ORAL | Status: DC
  Administered 2017-12-02 – 2017-12-05 (×4): 37.5 mg via ORAL
  Filled 2017-12-01 (×6): qty 0.5

## 2017-12-01 MED ORDER — ONDANSETRON HCL 4 MG/2ML IJ SOLN: 4.0000 mg | Freq: Four times a day (QID) | INTRAMUSCULAR | Status: DC | PRN

## 2017-12-01 MED ORDER — PHENOL 1.4 % MT LIQD: 1.0000 | OROMUCOSAL | Status: DC | PRN

## 2017-12-01 MED ORDER — DEXAMETHASONE SODIUM PHOSPHATE 10 MG/ML IJ SOLN
INTRAMUSCULAR | Status: DC | PRN
  Administered 2017-12-01: 10 mg via INTRAVENOUS

## 2017-12-01 MED ORDER — OXYCODONE-ACETAMINOPHEN 5-325 MG PO TABS: 1.0000 | ORAL_TABLET | ORAL | Status: DC | PRN

## 2017-12-01 MED ORDER — GUAIFENESIN-DM 100-10 MG/5ML PO SYRP: 15.0000 mL | ORAL_SOLUTION | ORAL | Status: DC | PRN

## 2017-12-01 MED ORDER — LACTATED RINGERS IV SOLN
INTRAVENOUS | Status: DC
  Administered 2017-12-01 (×2): via INTRAVENOUS

## 2017-12-01 MED ORDER — FENTANYL CITRATE (PF) 250 MCG/5ML IJ SOLN
INTRAMUSCULAR | Status: DC | PRN
  Administered 2017-12-01 (×3): 50 ug via INTRAVENOUS
  Administered 2017-12-01: 100 ug via INTRAVENOUS
  Administered 2017-12-01 (×4): 50 ug via INTRAVENOUS

## 2017-12-01 MED ORDER — KCL IN DEXTROSE-NACL 20-5-0.45 MEQ/L-%-% IV SOLN
INTRAVENOUS | Status: AC
  Filled 2017-12-01: qty 1000

## 2017-12-01 MED ORDER — SUCCINYLCHOLINE CHLORIDE 20 MG/ML IJ SOLN
INTRAMUSCULAR | Status: DC | PRN
  Administered 2017-12-01: 140 mg via INTRAVENOUS

## 2017-12-01 MED ORDER — PANTOPRAZOLE SODIUM 40 MG PO TBEC
40.0000 mg | DELAYED_RELEASE_TABLET | Freq: Every day | ORAL | Status: DC
  Administered 2017-12-02 – 2017-12-05 (×4): 40 mg via ORAL
  Filled 2017-12-01 (×4): qty 1

## 2017-12-01 MED ORDER — FENTANYL CITRATE (PF) 100 MCG/2ML IJ SOLN
INTRAMUSCULAR | Status: AC
  Administered 2017-12-01: 100 ug via INTRAVENOUS
  Filled 2017-12-01: qty 2

## 2017-12-01 MED ORDER — OXYCODONE-ACETAMINOPHEN 5-325 MG PO TABS
1.0000 | ORAL_TABLET | Freq: Four times a day (QID) | ORAL | Status: DC | PRN
  Administered 2017-12-01 – 2017-12-05 (×14): 2 via ORAL
  Filled 2017-12-01 (×15): qty 2

## 2017-12-01 MED ORDER — LIDOCAINE 2% (20 MG/ML) 5 ML SYRINGE
INTRAMUSCULAR | Status: DC | PRN
  Administered 2017-12-01: 100 mg via INTRAVENOUS

## 2017-12-01 MED ORDER — SODIUM CHLORIDE 0.9 % IV SOLN
INTRAVENOUS | Status: DC | PRN
  Administered 2017-12-01: 500 mL

## 2017-12-01 MED ORDER — HYDROMORPHONE HCL 1 MG/ML IJ SOLN
INTRAMUSCULAR | Status: AC
  Filled 2017-12-01: qty 1

## 2017-12-01 MED ORDER — GABAPENTIN 100 MG PO CAPS
200.0000 mg | ORAL_CAPSULE | Freq: Two times a day (BID) | ORAL | Status: DC
  Administered 2017-12-01 – 2017-12-05 (×8): 200 mg via ORAL
  Filled 2017-12-01 (×8): qty 2

## 2017-12-01 MED ORDER — SODIUM CHLORIDE 0.9 % IJ SOLN
INTRAVENOUS | Status: DC | PRN
  Administered 2017-12-01: 50 mL via INTRAMUSCULAR

## 2017-12-01 MED ORDER — HYDROMORPHONE HCL 1 MG/ML IJ SOLN
1.0000 mg | Freq: Once | INTRAMUSCULAR | Status: AC
  Administered 2017-12-01: 1 mg via INTRAVENOUS
  Filled 2017-12-01: qty 1

## 2017-12-01 MED ORDER — PROPOFOL 10 MG/ML IV BOLUS
INTRAVENOUS | Status: AC
  Filled 2017-12-01: qty 20

## 2017-12-01 MED ORDER — ATORVASTATIN CALCIUM 40 MG PO TABS
40.0000 mg | ORAL_TABLET | Freq: Every day | ORAL | Status: DC
  Administered 2017-12-03 – 2017-12-05 (×3): 40 mg via ORAL
  Filled 2017-12-01 (×3): qty 1

## 2017-12-01 MED ORDER — MEPERIDINE HCL 50 MG/ML IJ SOLN: 6.2500 mg | INTRAMUSCULAR | Status: DC | PRN

## 2017-12-01 MED ORDER — LABETALOL HCL 5 MG/ML IV SOLN
INTRAVENOUS | Status: AC
  Filled 2017-12-01: qty 4

## 2017-12-01 MED ORDER — LORAZEPAM 1 MG PO TABS
1.0000 mg | ORAL_TABLET | Freq: Three times a day (TID) | ORAL | Status: DC | PRN
  Administered 2017-12-01 – 2017-12-05 (×6): 1 mg via ORAL
  Filled 2017-12-01 (×6): qty 1

## 2017-12-01 MED ORDER — PROPOFOL 10 MG/ML IV BOLUS
INTRAVENOUS | Status: DC | PRN
  Administered 2017-12-01: 150 mg via INTRAVENOUS
  Administered 2017-12-01: 30 mg via INTRAVENOUS

## 2017-12-01 MED ORDER — ROCURONIUM BROMIDE 50 MG/5ML IV SOLN
INTRAVENOUS | Status: AC
  Filled 2017-12-01: qty 3

## 2017-12-01 MED ORDER — SUGAMMADEX SODIUM 200 MG/2ML IV SOLN
INTRAVENOUS | Status: DC | PRN
  Administered 2017-12-01: 200 mg via INTRAVENOUS

## 2017-12-01 MED ORDER — HYDRALAZINE HCL 20 MG/ML IJ SOLN: 5.0000 mg | INTRAMUSCULAR | Status: DC | PRN

## 2017-12-01 SURGICAL SUPPLY — 62 items
ADH SKN CLS APL DERMABOND .7 (GAUZE/BANDAGES/DRESSINGS) ×2
BANDAGE ESMARK 6X9 LF (GAUZE/BANDAGES/DRESSINGS) IMPLANT
BNDG CMPR 9X6 STRL LF SNTH (GAUZE/BANDAGES/DRESSINGS)
BNDG ESMARK 6X9 LF (GAUZE/BANDAGES/DRESSINGS)
CANISTER SUCT 3000ML PPV (MISCELLANEOUS) ×2 IMPLANT
CANNULA VESSEL 3MM 2 BLNT TIP (CANNULA) ×1 IMPLANT
CATH EMB 3FR 80CM (CATHETERS) ×1 IMPLANT
CATH EMB 4FR 80CM (CATHETERS) ×1 IMPLANT
CATH EMB 5FR 80CM (CATHETERS) IMPLANT
CLIP LIGATING EXTRA MED SLVR (CLIP) ×2 IMPLANT
CLIP LIGATING EXTRA SM BLUE (MISCELLANEOUS) ×2 IMPLANT
CUFF TOURNIQUET SINGLE 34IN LL (TOURNIQUET CUFF) IMPLANT
CUFF TOURNIQUET SINGLE 44IN (TOURNIQUET CUFF) IMPLANT
DERMABOND ADVANCED (GAUZE/BANDAGES/DRESSINGS) ×2
DERMABOND ADVANCED .7 DNX12 (GAUZE/BANDAGES/DRESSINGS) ×1 IMPLANT
DRAIN SNY 10X20 3/4 PERF (WOUND CARE) IMPLANT
DRAPE HALF SHEET 40X57 (DRAPES) ×1 IMPLANT
DRAPE X-RAY CASS 24X20 (DRAPES) ×1 IMPLANT
DRSG COVADERM 4X10 (GAUZE/BANDAGES/DRESSINGS) ×1 IMPLANT
ELECT REM PT RETURN 9FT ADLT (ELECTROSURGICAL) ×2
ELECTRODE REM PT RTRN 9FT ADLT (ELECTROSURGICAL) ×1 IMPLANT
EVACUATOR SILICONE 100CC (DRAIN) IMPLANT
GLOVE BIO SURGEON STRL SZ 6.5 (GLOVE) ×2 IMPLANT
GLOVE BIO SURGEON STRL SZ7.5 (GLOVE) ×2 IMPLANT
GLOVE BIOGEL M 6.5 STRL (GLOVE) ×1 IMPLANT
GLOVE BIOGEL PI IND STRL 7.0 (GLOVE) IMPLANT
GLOVE BIOGEL PI IND STRL 7.5 (GLOVE) IMPLANT
GLOVE BIOGEL PI INDICATOR 7.0 (GLOVE) ×4
GLOVE BIOGEL PI INDICATOR 7.5 (GLOVE) ×4
GLOVE ECLIPSE 6.5 STRL STRAW (GLOVE) ×1 IMPLANT
GLOVE INDICATOR 7.0 STRL GRN (GLOVE) ×1 IMPLANT
GLOVE SS BIOGEL STRL SZ 7.5 (GLOVE) ×1 IMPLANT
GLOVE SUPERSENSE BIOGEL SZ 7.5 (GLOVE) ×1
GOWN STRL REUS W/ TWL LRG LVL3 (GOWN DISPOSABLE) ×3 IMPLANT
GOWN STRL REUS W/ TWL XL LVL3 (GOWN DISPOSABLE) IMPLANT
GOWN STRL REUS W/TWL LRG LVL3 (GOWN DISPOSABLE) ×10
GOWN STRL REUS W/TWL XL LVL3 (GOWN DISPOSABLE) ×2
KIT BASIN OR (CUSTOM PROCEDURE TRAY) ×2 IMPLANT
KIT TURNOVER KIT B (KITS) ×2 IMPLANT
LOOP VESSEL MAXI BLUE (MISCELLANEOUS) ×3 IMPLANT
NS IRRIG 1000ML POUR BTL (IV SOLUTION) ×4 IMPLANT
PACK PERIPHERAL VASCULAR (CUSTOM PROCEDURE TRAY) ×2 IMPLANT
PAD ARMBOARD 7.5X6 YLW CONV (MISCELLANEOUS) ×4 IMPLANT
PADDING CAST COTTON 6X4 STRL (CAST SUPPLIES) IMPLANT
SET COLLECT BLD 21X3/4 12 (NEEDLE) IMPLANT
SET COLLECT BLD 21X3/4 12 PB (MISCELLANEOUS) ×1 IMPLANT
STAPLER VISISTAT 35W (STAPLE) IMPLANT
STOPCOCK 4 WAY LG BORE MALE ST (IV SETS) ×1 IMPLANT
SUT ETHILON 3 0 PS 1 (SUTURE) IMPLANT
SUT PROLENE 5 0 C 1 24 (SUTURE) ×2 IMPLANT
SUT PROLENE 6 0 CC (SUTURE) ×8 IMPLANT
SUT SILK 2 0 PERMA HAND 18 BK (SUTURE) ×1 IMPLANT
SUT VIC AB 2-0 CTX 36 (SUTURE) ×4 IMPLANT
SUT VIC AB 3-0 SH 27 (SUTURE) ×8
SUT VIC AB 3-0 SH 27X BRD (SUTURE) ×1 IMPLANT
SYR 20CC LL (SYRINGE) ×1 IMPLANT
SYR 3ML LL SCALE MARK (SYRINGE) ×2 IMPLANT
TOWEL GREEN STERILE (TOWEL DISPOSABLE) ×2 IMPLANT
TRAY FOLEY MTR SLVR 16FR STAT (SET/KITS/TRAYS/PACK) ×2 IMPLANT
TUBING EXTENTION W/L.L. (IV SETS) ×1 IMPLANT
UNDERPAD 30X30 (UNDERPADS AND DIAPERS) ×2 IMPLANT
WATER STERILE IRR 1000ML POUR (IV SOLUTION) ×2 IMPLANT

## 2017-12-01 NOTE — Anesthesia Preprocedure Evaluation (Signed)
Anesthesia Evaluation  Patient identified by MRN, date of birth, ID band Patient awake    Reviewed: Allergy & Precautions, NPO status , Patient's Chart, lab work & pertinent test results  Airway Mallampati: I  TM Distance: >3 FB Neck ROM: Full    Dental   Pulmonary former smoker,    Pulmonary exam normal        Cardiovascular hypertension, Pt. on medications + Peripheral Vascular Disease  Normal cardiovascular exam     Neuro/Psych Anxiety Depression    GI/Hepatic GERD  Medicated and Controlled,  Endo/Other    Renal/GU      Musculoskeletal   Abdominal   Peds  Hematology   Anesthesia Other Findings   Reproductive/Obstetrics                             Anesthesia Physical Anesthesia Plan  ASA: III and emergent  Anesthesia Plan: General   Post-op Pain Management:    Induction: Intravenous  PONV Risk Score and Plan: 2 and Ondansetron and Treatment may vary due to age or medical condition  Airway Management Planned: Oral ETT  Additional Equipment:   Intra-op Plan:   Post-operative Plan: Extubation in OR  Informed Consent: I have reviewed the patients History and Physical, chart, labs and discussed the procedure including the risks, benefits and alternatives for the proposed anesthesia with the patient or authorized representative who has indicated his/her understanding and acceptance.     Plan Discussed with: CRNA and Surgeon  Anesthesia Plan Comments: (Pt in extreme pain -> IV Fentanyl in Preop.)        Anesthesia Quick Evaluation

## 2017-12-01 NOTE — H&P (Signed)
[facility]ist of [place]  Patient name: ONEY FOLZ MRN: [id number] DOB: 1966-06-09 Sex: male   HPI: JON LALL is a 52 y.o. male the patient presents to the emergency room this morning with new onset of severe pain coldness and numbness in his left foot.  He recently had presented with pain severe ischemia in his right foot.  He underwent evaluation which included a CT angiogram on 11/11/2017 showing multilevel arterial disease.  He had no symptoms in his left foot at that time and had ischemic changes in his right foot.  He had complete occlusion of his left iliac arteries despite no symptoms on the left.  On the right he had subtotal occlusion of the iliac vessels.  His CT scan revealed bilateral superficial femoral artery occlusions with reconstitution of the above-knee popliteal arteries bilaterally.  He underwent aortobifemoral bypass and right femoral to popliteal bypass with Dr. Scot Dock on 11/15/2017.  He continued to have pain in the usual postoperative swelling after aortobifem and right femoropopliteal.  He was seen 3 days ago in our office by Dr. Scot Dock.  At that time he was continued to have some pain in his right foot as expected but was having no left leg symptoms.  Of note his preoperative ankle arm index was 0.47 on the right and 0.30 on the left.  Postoperatively his ankle arm index was 0.84 on the right and 0.53 on the left.  He presents to the emergency room this morning awakening with a profound ischemia of his left foot.  His right foot is the usual amount of pain that he has been experiencing but this is new for his left foot.  Past Medical History:  Diagnosis Date  . Anxiety   . Arterial occlusive disease    multilevel arterial occlusive disease/notes 11/13/2017  . Arthritis    "left knee" (11/13/2017)  . Chronic back pain   . Chronic lower back pain   . Depression   . Gastric ulcer due to Helicobacter pylori 4034    . GERD (gastroesophageal reflux disease)   . High cholesterol   . Hypertension   . IBS (irritable bowel syndrome)   . Mesenteric adenitis 2011   presumed/H&P  . Migraines    "use to suffer migraines years ago" (11/13/2017)  . Peripheral vascular disease ([place])   . Pneumonia 2011  . Ulcer     Family History  Problem Relation Age of Onset  . Hypertension Mother   . Hypertension Father   . Coronary artery disease Father 29  . Heart attack Brother 43  . Coronary artery disease Brother   . Diabetes Neg Hx   . Stroke Neg Hx     SOCIAL HISTORY: Social History   Tobacco Use  . Smoking status: Former Smoker    Packs/day: 1.00    Years: 24.00    Pack years: 24.00    Types: Cigarettes    Start date: 11/28/2017  . Smokeless tobacco: Never Used  Substance Use Topics  . Alcohol use: Yes    Comment: 11/13/2017 "did drink wine q  1-2 months; nothing anymore"     Allergies  Allergen Reactions  . Pork-Derived Products     UNSPECIFIED REASON Pt doesn't eat pork  . Morphine And Related Other (See Comments)    hallucinations    Current Facility-Administered Medications  Medication Dose Route Frequency Provider Last Rate Last Dose  . HYDROmorphone (DILAUDID) injection 1 mg  1 mg Intravenous Q2H PRN  Domenic Moras, PA-C   1 mg at 12/01/17 0735  . sodium chloride 0.9 % bolus 1,000 mL  1,000 mL Intravenous Once Domenic Moras, PA-C 984 mL/hr at 12/01/17 0655 1,000 mL at 12/01/17 4098   Current Outpatient Medications  Medication Sig Dispense Refill  . atorvastatin (LIPITOR) 40 MG tablet Take 1 tablet (40 mg total) by mouth daily. 30 tablet 1  . gabapentin (NEURONTIN) 100 MG capsule Take 2 capsules (200 mg total) by mouth 2 (two) times daily. 60 capsule 2  . hydrochlorothiazide (HYDRODIURIL) 25 MG tablet Take 25 mg by mouth daily.    Marland Kitchen LORazepam (ATIVAN) 1 MG tablet Take 1 mg by mouth every 8 (eight) hours as needed for anxiety.    . meloxicam (MOBIC) 15 MG tablet Take 15 mg by mouth daily as  needed for pain.    . metoprolol succinate (TOPROL-XL) 100 MG 24 hr tablet Take 100 mg by mouth at bedtime. Take with or immediately following a meal.    . oxyCODONE-acetaminophen (PERCOCET/ROXICET) 5-325 MG tablet Take 1-2 tablets by mouth every 6 (six) hours as needed for severe pain. 30 tablet 0  . valsartan (DIOVAN) 160 MG tablet Take 160 mg by mouth daily.  4  . gabapentin (NEURONTIN) 300 MG capsule Take 1 capsule (300 mg total) by mouth 3 (three) times daily. (Patient not taking: Reported on 12/01/2017) 90 capsule 1  . oxyCODONE-acetaminophen (PERCOCET/ROXICET) 5-325 MG tablet Take 1-2 tablets by mouth every 4 (four) hours as needed for severe pain. (Patient not taking: Reported on 12/01/2017) 40 tablet 0    REVIEW OF SYSTEMS:  [X]  denotes positive finding, [ ]  denotes negative finding Cardiac  Comments:  Chest pain or chest pressure:    Shortness of breath upon exertion:    Short of breath when lying flat:    Irregular heart rhythm:        Vascular    Pain in calf, thigh, or hip brought on by ambulation:    Pain in feet at night that wakes you up from your sleep:     Blood clot in your veins:    Leg swelling:  x         PHYSICAL EXAM: Vitals:   12/01/17 0615 12/01/17 0645 12/01/17 0700 12/01/17 0716  BP:    (!) 184/98  Pulse: 86 78 84 88  Resp:  18 (!) 23 (!) 23  Temp:      TempSrc:      SpO2: 100% 97% 98% 100%    GENERAL: The patient is a well-nourished male, in no acute distress. The vital signs are documented above. CARDIOVASCULAR: I do not palpate pedal pulses bilaterally.  He has no femoral pulse on the left.  He does have significant amount of what appears to be serous fluid in his right groin does seem that he has a pulse but this is not very prominent.  By Doppler he has brisk signals in his right foot.  He is an audible in his left foot.  His left foot is cold with pallor and cadaveric in appearance. PULMONARY: There is good air exchange  MUSCULOSKELETAL: There are  no major deformities or cyanosis. NEUROLOGIC: Diminished motor and sensory function in left foot SKIN: There are no ulcers or rashes noted. PSYCHIATRIC: The patient has a normal affect.  DATA:  I did review his CT scan images from 11/11/2017 in detail.  Does show left iliac occlusion and left superficial femoral artery occlusion.  MEDICAL ISSUES: Profound ischemia which is  new in his left foot.  Appears that he is at his baseline in his right foot.  Had a long discussion with the patient and his wife present.  Will take immediately to the operating room for exploration.  Explained that this appears to be occlusion of the left limb of his aortofemoral bypass.  Will undergo thrombectomy and possible revision.  Also explained that may proceed with left femoral to popliteal bypass if it was felt that outflow could be the cause of his Krisandra Bueno graft failure.  They understand that he does not have a viable foot as he stands currently.  Will also be prepared for intervention in the right leg if deemed appropriate at the time of surgery    Rosetta Posner, MD [facility] Tel [phone] Pager [phone]

## 2017-12-01 NOTE — Anesthesia Procedure Notes (Signed)
Procedure Name: Intubation Date/Time: 12/01/2017 8:58 AM Performed by: Izora Gala, CRNA Pre-anesthesia Checklist: Patient identified, Emergency Drugs available, Suction available and Patient being monitored Patient Re-evaluated:Patient Re-evaluated prior to induction Oxygen Delivery Method: Circle system utilized Preoxygenation: Pre-oxygenation with 100% oxygen Induction Type: IV induction Ventilation: Mask ventilation without difficulty Laryngoscope Size: Miller and 3 Grade View: Grade II Tube type: Oral Tube size: 8.0 mm Number of attempts: 1 Airway Equipment and Method: Patient positioned with wedge pillow Placement Confirmation: ETT inserted through vocal cords under direct vision,  positive ETCO2 and breath sounds checked- equal and bilateral Secured at: 22 cm Tube secured with: Tape Dental Injury: Teeth and Oropharynx as per pre-operative assessment

## 2017-12-01 NOTE — Anesthesia Postprocedure Evaluation (Signed)
Anesthesia Post Note  Patient: Tony [name]  Procedure(s) Performed: THROMBECTOMY OF LEFT AORTAFEMORAL BYPASS, THROMBECTOMY OF TIBIAL ARTERY (Left ) BYPASS GRAFT FEMORAL- ABOVE KNEE POPLITEAL ARTERY WITH VEIN (Left ) INTRA OPERATIVE ARTERIOGRAM OF LEFT LEG (Left )     Anesthesia Post Evaluation  Last Vitals:  Vitals:   12/01/17 1410 12/01/17 1411  BP: (!) 175/93   Pulse:  100  Resp:  14  Temp:    SpO2:  100%    Last Pain:  Vitals:   12/01/17 1411  TempSrc:   PainSc: 7                  Lacy Taglieri DAVID

## 2017-12-01 NOTE — Progress Notes (Signed)
Patient ID: Tony [name], male   DOB: 04/25/1966, 52 y.o.   MRN: [id number] Called to see patient regarding increased pain and concern regarding swelling in the left groin.  Easily palpable left posterior tibial pulse.  Expected fullness in left groin from redo surgery and vein harvest and left femoral to popliteal bypass.  No evidence of bleed.  Palpable right femoral graft pulse.  Stable.  Reassured patient.  Dilaudid for pain control.  Morphine causes hallucinations by history

## 2017-12-01 NOTE — Progress Notes (Signed)
Pt found in fetal position holding left side c/o 9/10 pain.  Left groin incision swollen, left flank tender. VS stable.  PRN pain meds given, RR RN called, and MD notified.  Await further orders.

## 2017-12-01 NOTE — ED Provider Notes (Signed)
[facility] Provider Note   CSN: [id number] Arrival date & time: 12/01/17  0559     History   Chief Complaint Chief Complaint  Patient presents with  . Leg Pain    HPI Tony [name] is a 52 y.o. male.  The history is provided by the patient. No language interpreter was used.  Leg Pain   This is a recurrent problem.     52 year old male with significant history of aorto iliac occlusive disease, PVD, chronic abdominal pain, lower limb ischemia status post aortobifemoral bypass graft, right femoral bypass graft presenting with worsening back pain and now with new left foot numbness.  Patient mention having surgery to his right lower extremity 2 weeks ago.  Since then, he still endorse pain to his right lower extremities.  Described pain is sharp throbbing sensation, moderate in severity, not well controlled with home pain medication.  He was admitted a week later for pain control and subsequently discharged home with Percocet.  He still endorse pain but the reason he came in today was due to new numbness to his left foot.  Left foot numbness started earlier this morning and that is new.  He acknowledged that his toes are usually dusky and that is not new.  No report of fever but endorsed chills which has been persistent since surgery.  No complaints of chest pain shortness of breath or productive cough.  Past Medical History:  Diagnosis Date  . Anxiety   . Arterial occlusive disease    multilevel arterial occlusive disease/notes 11/13/2017  . Arthritis    "left knee" (11/13/2017)  . Chronic back pain   . Chronic lower back pain   . Depression   . Gastric ulcer due to Helicobacter pylori 8676  . GERD (gastroesophageal reflux disease)   . High cholesterol   . Hypertension   . IBS (irritable bowel syndrome)   . Mesenteric adenitis 2011   presumed/H&P  . Migraines    "use to suffer migraines years ago" (11/13/2017)  . Peripheral vascular  disease ([place])   . Pneumonia 2011  . Ulcer     Patient Active Problem List   Diagnosis Date Noted  . Aortoiliac occlusive disease ([place]) 11/15/2017  . Pain of right lower extremity due to ischemia 11/13/2017  . Hyperlipidemia LDL goal <70 11/13/2017  . PVD (peripheral vascular disease) ([place]) 11/11/2017  . H. pylori infection 10/11/2011  . Duodenal ulcer 08/23/2011  . IBS (irritable bowel syndrome) 08/18/2011  . Depression with anxiety 05/11/2009  . EPIGASTRIC PAIN, CHRONIC 05/11/2009  . SMOKER 09/01/2008  . INSOMNIA, CHRONIC 09/01/2008  . Essential hypertension 09/01/2008  . PALPITATIONS, CHRONIC 09/01/2008  . GASTRITIS 10/24/2007    Past Surgical History:  Procedure Laterality Date  . AORTA - BILATERAL FEMORAL ARTERY BYPASS GRAFT Bilateral 11/15/2017   Procedure: AORTA BIFEMORAL BYPASS GRAFT;  Surgeon: Angelia Mould, MD;  Location: [facility];  Service: Vascular;  Laterality: Bilateral;  . BACK SURGERY  X 4   "procedure where they burn the nerves every 6 months"  . ESOPHAGOGASTRODUODENOSCOPY  08/22/2011   Procedure: ESOPHAGOGASTRODUODENOSCOPY (EGD);  Surgeon: [name], MD;  Location: [facility];  Service: Gastroenterology;  Laterality: N/A;  . FEMORAL-POPLITEAL BYPASS GRAFT Right 11/15/2017   Procedure: Right FEMORAL-Above knee POPLITEAL ARTERY Bypass Graft using nonreversed saphenous vein ;  Surgeon: Angelia Mould, MD;  Location: [facility];  Service: Vascular;  Laterality: Right;  . INTRAOPERATIVE ARTERIOGRAM Right 11/15/2017   Procedure: INTRA OPERATIVE  ARTERIOGRAM;  Surgeon: Angelia Mould, MD;  Location: [facility] OR;  Service: Vascular;  Laterality: Right;  . KNEE ARTHROSCOPY Left X 5        Home Medications    Prior to Admission medications   Medication Sig Start Date End Date Taking? Authorizing Provider  atorvastatin (LIPITOR) 40 MG tablet Take 1 tablet (40 mg total) by mouth daily. 11/26/17   Ulyses Amor, PA-C  gabapentin (NEURONTIN) 100 MG capsule  Take 2 capsules (200 mg total) by mouth 2 (two) times daily. 11/11/17   Ulyses Amor, PA-C  gabapentin (NEURONTIN) 300 MG capsule Take 1 capsule (300 mg total) by mouth 3 (three) times daily. 11/26/17   Ulyses Amor, PA-C  hydrochlorothiazide (HYDRODIURIL) 25 MG tablet Take 25 mg by mouth daily.    [provider]  metoprolol succinate (TOPROL-XL) 100 MG 24 hr tablet Take 100 mg by mouth at bedtime. Take with or immediately following a meal.    [provider]  oxyCODONE-acetaminophen (PERCOCET/ROXICET) 5-325 MG tablet Take 1-2 tablets by mouth every 4 (four) hours as needed for severe pain. 11/26/17   Ulyses Amor, PA-C  oxyCODONE-acetaminophen (PERCOCET/ROXICET) 5-325 MG tablet Take 1-2 tablets by mouth every 6 (six) hours as needed for severe pain. 11/28/17   Angelia Mould, MD  valsartan (DIOVAN) 160 MG tablet Take 160 mg by mouth daily. 11/09/17   [provider]    Family History Family History  Problem Relation Age of Onset  . Hypertension Mother   . Hypertension Father   . Coronary artery disease Father 63  . Heart attack Brother 74  . Coronary artery disease Brother   . Diabetes Neg Hx   . Stroke Neg Hx     Social History Social History   Tobacco Use  . Smoking status: Former Smoker    Packs/day: 1.00    Years: 24.00    Pack years: 24.00    Types: Cigarettes    Start date: 11/28/2017  . Smokeless tobacco: Never Used  Substance Use Topics  . Alcohol use: Yes    Comment: 11/13/2017 "did drink wine q  1-2 months; nothing anymore"   . Drug use: Not Currently    Types: Marijuana    Comment: 11/13/2017  "last drug use was in ~ 1987 "     Allergies   Pork-derived products and Morphine and related   Review of Systems Review of Systems  All other systems reviewed and are negative.    Physical Exam Updated Vital Signs BP (!) 159/69 (BP Location: Right Arm)   Pulse 73   Temp 98.2 F (36.8 C) (Oral)   Resp 18   SpO2 99%    Physical Exam  Constitutional: He appears well-developed and well-nourished. He appears distressed (writhing in pain appears uncomfortable.).  HENT:  Head: Atraumatic.  Eyes: Conjunctivae are normal.  Neck: Neck supple.  Cardiovascular: Normal rate and regular rhythm.  Pulmonary/Chest: Effort normal and breath sounds normal.  Abdominal: Soft. There is no tenderness.  Musculoskeletal: He exhibits tenderness (Right lower extremity: Extremity is edematous, erythematous with dusky toes, diffuse tenderness to palpation DP pulse palpable with bedside Doppler.).  Left lower extremity: Left foot is pale in appearance, dusky toes, unable to palpate DP and PT pulse using bedside Doppler.  Sensation is intact at the ankle and above.  Leg is cool to the touch.  Neurological: He is alert.  Skin: No rash noted.  Psychiatric: He has a normal mood and affect.  Nursing note and vitals reviewed.    ED Treatments / Results  Labs (all labs ordered are listed, but only abnormal results are displayed) Labs Reviewed  CBC WITH DIFFERENTIAL/PLATELET  BASIC METABOLIC PANEL  TYPE AND SCREEN    EKG None  Radiology No results found.  Procedures .Critical Care Performed by: Domenic Moras, PA-C Authorized by: Domenic Moras, PA-C   Critical care provider statement:    Critical care time (minutes):  35   Critical care start time:  12/01/2017 6:47 AM   Critical care end time:  12/01/2017 7:39 AM   Critical care was necessary to treat or prevent imminent or life-threatening deterioration of the following conditions:  Circulatory failure   Critical care was time spent personally by me on the following activities:  Development of treatment plan with patient or surrogate, discussions with consultants, evaluation of patient's response to treatment, examination of patient, obtaining history from patient or surrogate, ordering and review of laboratory studies, pulse oximetry, re-evaluation of patient's condition and  review of old charts   (including critical care time)  Medications Ordered in ED Medications - No data to display   Initial Impression / Assessment and Plan / ED Course  I have reviewed the triage vital signs and the nursing notes.  Pertinent labs & imaging results that were available during my care of the patient were reviewed by me and considered in my medical decision making (see chart for details).     BP (!) 184/98   Pulse 88   Temp 98.2 F (36.8 C) (Oral)   Resp (!) 23   SpO2 100%    Final Clinical Impressions(s) / ED Diagnoses   Final diagnoses:  Ischemic pain of left foot    ED Discharge Orders    None     7:04 AM Patient with significant history of arterial occlusion requiring right aortofemoral bypass 2 weeks ago, here with left foot numbness which is new and started this morning.  Left foot is cool to the touch with dusky toe.  Unable to appreciate DP and PT pulse using bedside Doppler.  Sensation diminished in the foot however sensation is intact from the ankle and above.  Care discussed with Dr. Tyrone Nine.  I have consulted on-call vascular surgeon, Dr. early who will evaluate patient and determine further management.  7:38 AM Dr. Donnetta Hutching has seen and evaluated patient and will take patient to the OR for emergent procedure, likely a left aortofemoral bypass.   Domenic Moras, PA-C 12/01/17 [place], [place], DO 12/03/17 1012

## 2017-12-01 NOTE — ED Triage Notes (Signed)
Pt to ED via EMS. Pt had recent surgery, pt been taking oxycodone, diaphoretic and writhing with pain in right foot up to groin area. No marked swelling or infection noted per EMS. R foot swelling, cool to touch, but pt sts that is improved from before surgery. 100 mcg Fentanyl given PTA brought pain to 7/10. 173/87, 72 HR, RR 16, 99% room air.

## 2017-12-01 NOTE — Op Note (Signed)
OPERATIVE REPORT  DATE OF SURGERY: 12/01/2017  PATIENT: Tony [name], 52 y.o. male MRN: [id number]  DOB: 1965-10-12  PRE-OPERATIVE DIAGNOSIS: Critical limb ischemia of left leg  POST-OPERATIVE DIAGNOSIS:  Same  PROCEDURE: Thrombectomy left limb aortofemoral bypass, left femoral to above-knee popliteal bypass with non-reversed great saphenous vein, tibial thrombectomy and intraoperative arteriogram  SURGEON:  Curt Jews, M.D.  PHYSICIAN ASSISTANT: Nurse  ANESTHESIA: General  EBL: 300 ml  Total I/O In: 5885 [I.V.:2600; IV Piggyback:250] Out: 027 [Urine:285; Blood:300]  BLOOD ADMINISTERED: None  DRAINS: None  SPECIMEN: None  COUNTS CORRECT:  YES  PLAN OF CARE: PACU  PATIENT DISPOSITION:  PACU - hemodynamically stable  PROCEDURE DETAILS: The patient is status post aortobifemoral bypass grafting and right femoral to popliteal bypass grafting for severe ischemia but Dr. Scot Dock on 11/15/2017.  He is been seen 3 days ago in the office at which time he was found to have healing of his incisions and resolution of the profound ischemia out of his right foot.  He presented to the emergency room today complaining of new onset of severe pain and numbness of his left foot.  Open to this point he had been asymptomatic on the left.  He did have known SFA occlusion and had a known bilateral iliac disease with left common iliac artery occlusion prior to his aortobifemoral bypass.  He was taken the operating room for thrombectomy of his left limb of his bypass and also for potential femoropopliteal bypass.  The left groin left leg were prepped and draped in usual sterile fashion.  Incision was made through the prior groin incision carried down to isolate the left limb of the graft which was occluded.  The external iliac was encircled at the level of the inguinal ligament and the deep femoral and superficial femoral arteries were encircled with Vesseloops.  The patient was given 8000  neutrophils venous heparin after adequate circulation time the hood of the graft was opened near the arterial anastomosis longitudinally with an 11 blade.  The superficial femoral artery was thrombectomized and was chronically occluded at mid thigh.  The deep femoral artery was thrombectomized and there was excellent backbleeding.  The Fogarty was passed up the external iliac in a retrograde fashion and this would only go approximately 5 cm.  Finally the left limb of the graft was thrombectomized.  There was excellent inflow with no evidence of difficulty through the graft itself.  This was flushed with heparinized saline and reoccluded.  Due to inability to find any technical issue I elected to proceed with left femoral to popliteal bypass with outflow disease as the possible cause for this Margene Cherian occlusion.  The saphenous vein was marked on the surface using SonoSite ultrasound.  The saphenous vein was identified through the groin incision and several skip areas the saphenous vein was harvested from the groin to the knee.  Tributary branches were ligated with 3-0 and 4 silk ties and divided.  The vein was divided distally and was gently dilated and was felt to be adequate for bypass.  The above-knee popliteal artery was exposed to the distal vein harvest incision.  The CT scan on 11/11/2017 which was before his aortofemoral bypass revealed occlusion of his superficial femoral with reconstitution of the above-knee popliteal artery with good vessel runoff through the tibials.  The saphenous vein was spatulated and sewn end-to-side to the hood of the aortofemoral graft with a running 6-0 Prolene suture.  This was in a non-reversed fashion.  Clamps removed and good anastomosis was encountered.  The vein valves were lysed with the [place] valvulotome giving good flow through the vein graft.  The vein was then brought to the sub-prior created tunnel in an anatomic configuration to approximation with the above-knee popliteal  artery.  The artery was occluded proximally distally and was opened with an 11 blade and sent longitudinally with Potts scissors.  The graft was cut to the appropriate length and was spatulated and sewn end-to-side to the artery with a running 6-0 Prolene suture.  Clamps removed and good flow was noted at the level of the popliteal artery.  There was still no audible flow at the ankle.  For this reason intraoperative arteriogram was obtained and this showed a technically good anastomosis in the above-knee position with a normal popliteal artery at the knee.  There was complete occlusion of all trifurcation vessels with what appeared to be acute clot.  Another incision was made at the medial approach to the below-knee popliteal artery.  The anterior tibial and tibioperoneal trunks were isolated.  The popliteal artery was occluded and the popliteal artery was opened transversely just above the anterior tibial artery takeoff.  A 3 Fogarty catheter was passed down the anterior tibial artery but would not go past the mid calf.  The catheter was passed down the tibioperoneal trunk and clot was removed from the tibioperoneal trunk and the anterior tibial artery origin.  When no further passes thrombus were removed the incision in the below-knee popliteal artery was closed with interrupted 6-0 Prolene sutures.  Clamps removed and posterior tibial Doppler flow was noted at the foot.  The patient's heparin was not reversed.  The wounds irrigated with saline.  Hemostasis was obtained electrocautery.  The wounds were closed with 2-0 Vicryl in the fascia and the deep tissues and the skin was closed with 3-0 subcuticular Vicryl sutures.  Patient was transferred to the recovery room in stable condition   Rosetta Posner, M.D., [facility] 12/01/2017 1:39 PM

## 2017-12-01 NOTE — Transfer of Care (Signed)
Immediate Anesthesia Transfer of Care Note  Patient: Tony [name]  Procedure(s) Performed: THROMBECTOMY OF LEFT AORTAFEMORAL BYPASS, THROMBECTOMY OF TIBIAL ARTERY (Left ) BYPASS GRAFT FEMORAL- ABOVE KNEE POPLITEAL ARTERY WITH VEIN (Left ) INTRA OPERATIVE ARTERIOGRAM OF LEFT LEG (Left )  Patient Location: PACU  Anesthesia Type:General  Level of Consciousness: awake, oriented and patient cooperative  Airway & Oxygen Therapy: Patient Spontanous Breathing and Patient connected to nasal cannula oxygen  Post-op Assessment: Report given to RN, Post -op Vital signs reviewed and stable, Patient moving all extremities and Patient moving all extremities X 4  Post vital signs: Reviewed and stable  Last Vitals:  Vitals Value Taken Time  BP 176/85 12/01/2017  1:25 PM  Temp    Pulse 90 12/01/2017  1:34 PM  Resp 15 12/01/2017  1:34 PM  SpO2 100 % 12/01/2017  1:34 PM  Vitals shown include unvalidated device data.  Last Pain:  Vitals:   12/01/17 0823  TempSrc:   PainSc: 8          Complications: No apparent anesthesia complications

## 2017-12-01 NOTE — Progress Notes (Signed)
RN called regarding increase pain to Left leg and swelling to Left groin. Pt arrived to floor from pacu s/p aortobifemoral bypass and popliteal bypass approx 1530. Pt received Dilaudid 1 mg IVP 1406. Palpable posterior/tibial and dorsal pulses. Dr. Donnetta Hutching to bedside. No interventions from RRT

## 2017-12-02 ENCOUNTER — Encounter (HOSPITAL_COMMUNITY): Payer: Self-pay | Admitting: Vascular Surgery

## 2017-12-02 LAB — CBC
HEMATOCRIT: 26.2 % — AB (ref 39.0–52.0)
Hemoglobin: 8.1 g/dL — ABNORMAL LOW (ref 13.0–17.0)
MCH: 29.1 pg (ref 26.0–34.0)
MCHC: 30.9 g/dL (ref 30.0–36.0)
MCV: 94.2 fL (ref 78.0–100.0)
Platelets: 314 10*3/uL (ref 150–400)
RBC: 2.78 MIL/uL — AB (ref 4.22–5.81)
RDW: 13.2 % (ref 11.5–15.5)
WBC: 8.4 10*3/uL (ref 4.0–10.5)

## 2017-12-02 LAB — BASIC METABOLIC PANEL
ANION GAP: 9 (ref 5–15)
BUN: 10 mg/dL (ref 6–20)
CO2: 24 mmol/L (ref 22–32)
Calcium: 8.4 mg/dL — ABNORMAL LOW (ref 8.9–10.3)
Chloride: 104 mmol/L (ref 101–111)
Creatinine, Ser: 0.94 mg/dL (ref 0.61–1.24)
Glucose, Bld: 138 mg/dL — ABNORMAL HIGH (ref 65–99)
POTASSIUM: 3.9 mmol/L (ref 3.5–5.1)
Sodium: 137 mmol/L (ref 135–145)

## 2017-12-02 NOTE — Progress Notes (Signed)
Subjective: Interval History: none.. Comfortable this morning.  Denies any left foot pain  Objective: Vital signs in last 24 hours: Temp:  [98.1 F (36.7 C)-98.5 F (36.9 C)] 98.4 F (36.9 C) (06/23 0327) Pulse Rate:  [74-105] 94 (06/23 0700) Resp:  [12-25] 13 (06/23 0700) BP: (128-184)/(63-97) 156/88 (06/23 0700) SpO2:  [98 %-100 %] 100 % (06/23 0700)  Intake/Output from previous day: 06/22 0701 - 06/23 0700 In: 2970 [I.V.:2720; IV Piggyback:250] Out: 1510 [Urine:1210; Blood:300] Intake/Output this shift: No intake/output data recorded.  Left groin and leg incision without hematoma or drainage.  Palpable left posterior tibial pulse.  Excellent Doppler signal through tibial and posterior tibial bilaterally  Lab Results: Recent Labs    12/01/17 0728 12/02/17 0050  WBC 11.9* 8.4  HGB 10.5* 8.1*  HCT 32.4* 26.2*  PLT 360 314   BMET Recent Labs    12/01/17 0728 12/02/17 0050  NA 139 137  K 3.6 3.9  CL 103 104  CO2 24 24  GLUCOSE 122* 138*  BUN 10 10  CREATININE 0.93 0.94  CALCIUM 9.0 8.4*    Studies/Results: Dg Chest 2 View  Result Date: 11/13/2017 CLINICAL DATA:  Preoperative evaluation for vascular surgery EXAM: CHEST - 2 VIEW COMPARISON:  December 26, 2016 FINDINGS: There is minimal atelectatic change in the left base. Lungs elsewhere are clear. Heart size and pulmonary vascularity are normal. No adenopathy. No bone lesions. IMPRESSION: Minimal atelectatic change left base.  No edema or consolidation. Electronically Signed   By: Lowella Grip III M.D.   On: 11/13/2017 08:14   Ct Angio Ao+bifem W & Or Wo Contrast  Result Date: 11/11/2017 CLINICAL DATA:  Cold right foot pain and burning sensation. Mild burning sensation at the left foot. EXAM: CT ANGIOGRAPHY OF ABDOMINAL AORTA WITH ILIOFEMORAL RUNOFF TECHNIQUE: Multidetector CT imaging of the abdomen, pelvis and lower extremities was performed using the standard protocol during bolus administration of intravenous  contrast. Multiplanar CT image reconstructions and MIPs were obtained to evaluate the vascular anatomy. CONTRAST:  126mL ISOVUE-370 IOPAMIDOL (ISOVUE-370) INJECTION 76% COMPARISON:  None. FINDINGS: VASCULAR Aorta: Mild mural thrombus is noted along the distal abdominal aorta. Mild calcification is noted along the abdominal aorta. The abdominal aorta is otherwise unremarkable. Celiac: Severe luminal narrowing is noted at the origin of the celiac trunk. SMA: Minimal calcification is noted at the branches of the superior mesenteric artery. It is otherwise unremarkable. Renals: Mild scattered calcification is noted along the proximal renal arteries bilaterally. The renal arteries are otherwise unremarkable. IMA: There is mild narrowing at the origin of the inferior mesenteric artery. RIGHT Lower Extremity Inflow: Scattered calcification and mild mural thrombus is noted along the right common iliac artery. There is mild luminal narrowing along the right external iliac artery due to mural thrombus and calcification. There is significant luminal narrowing along the right internal iliac artery, with scattered calcification. The right common femoral artery remains patent. Outflow: There is chronic occlusion of the right superficial femoral artery. There is collateralization along the right thigh from the branches of the profunda femoris artery. The popliteal artery appears to reconstitute from collateral vessels. Runoff: There is patent two-vessel runoff to the level of the right ankle. The anterior tibial artery is diminutive in appearance, thought to be developmental in nature. LEFT Lower Extremity Inflow: There is occlusion of the left common, internal and external iliac arteries. There is reconstitution of the distal left internal iliac artery and left common femoral artery from collateral branches, likely from the  circumflex iliac artery. Scattered calcification is noted along the left common femoral artery, with mild  luminal narrowing. Outflow: There is diffuse calcific atherosclerotic disease along the superficial femoral artery. It occludes at the level of the mid superficial femoral artery. The profunda femoris arteries are unremarkable. The popliteal artery appears reconstituted from collateral vessels. Runoff: There is patent two-vessel runoff to the level of the left ankle. The anterior tibial artery is diminutive in appearance, thought to be developmental in nature. Veins: Visualized venous structures are grossly unremarkable in appearance. Review of the MIP images confirms the above findings. NON-VASCULAR Lower chest: Mild bibasilar atelectasis is noted. The visualized portions of the mediastinum are unremarkable. Hepatobiliary: The liver is unremarkable in appearance. The gallbladder is unremarkable in appearance. The common bile duct remains normal in caliber. Pancreas: The pancreas is within normal limits. Spleen: The spleen is unremarkable in appearance. Adrenals/Urinary Tract: The nodular appearance of the left adrenal gland is of uncertain significance. The right adrenal gland is unremarkable. The kidneys are within normal limits. There is no evidence of hydronephrosis. No renal or ureteral stones are identified. Nonspecific perinephric stranding is noted bilaterally. Stomach/Bowel: The stomach is unremarkable in appearance. The small bowel is within normal limits. The appendix is normal in caliber, without evidence of appendicitis. The colon is unremarkable in appearance. Lymphatic: No retroperitoneal or pelvic sidewall lymphadenopathy is seen. Reproductive: The bladder is mildly distended and grossly unremarkable. The prostate remains normal in size. Other: No additional soft tissue abnormalities are seen. Musculoskeletal: No acute osseous abnormalities are identified. The visualized musculature is unremarkable in appearance. IMPRESSION: VASCULAR 1. On the right, there is occlusion of the entirety of the right  superficial femoral artery. There is reconstitution of the popliteal artery from branches of the profunda femoris. Patent two-vessel runoff to the level of the right ankle. The anterior tibial artery is diminutive in appearance, thought to be developmental in nature. 2. On the left, there is occlusion of the left common, internal and external iliac arteries. There is reconstitution of the left common femoral artery. Diffuse calcific atherosclerotic disease is noted along the left superficial femoral artery, which occludes at its midportion. Popliteal artery reconstitutes from branches of the profunda femoris. Patent two-vessel runoff to the level of the left ankle. The anterior tibial artery is diminutive in appearance, thought to be developmental in nature. 3. Severe luminal narrowing at the origin of the celiac trunk. 4. Mild luminal narrowing along the right external iliac artery, significant luminal narrowing along the right internal iliac artery, mild narrowing of the left common femoral artery, and mild narrowing at the origin of the inferior mesenteric artery. NON-VASCULAR 1. No acute nonvascular abnormality seen within the abdomen or pelvis. 2. Nonspecific nodular appearance of the left adrenal gland. This may reflect small underlying adrenal nodules. 3. Mild bibasilar atelectasis. These results were called by telephone at the time of interpretation on 11/11/2017 at 6:35 am to [facility] on [facility], who verbally acknowledged these results. Electronically Signed   By: Garald Balding M.D.   On: 11/11/2017 06:36   Dg Ang/ext/uni/or Right  Result Date: 11/15/2017 CLINICAL DATA:  Right femoropopliteal bypass graft. EXAM: INTRAOPERATIVE ANGIOGRAM TECHNIQUE: Cholangiographic images from the C-arm fluoroscopic device were submitted for interpretation post-operatively. Please see the procedural report for the amount of contrast and the fluoroscopy time utilized. COMPARISON:  CTA runoff 11/11/2017 FINDINGS: Single  intraoperative image demonstrates an arteriogram centered over the right knee. The femoropopliteal vein graft is patent and ties into the above  the knee popliteal artery. Popliteal artery is patent. Peroneal artery and posterior tibial artery are patent. Anterior tibial artery appears to occlude in the proximal calf and similar to the recent CTA findings. IMPRESSION: Right femoropopliteal bypass graft is patent. Electronically Signed   By: Markus Daft M.D.   On: 11/15/2017 17:53   Dg Chest Port 1 View  Result Date: 11/17/2017 CLINICAL DATA:  Fever, chest pain and shortness of breath EXAM: PORTABLE CHEST 1 VIEW COMPARISON:  11/16/2017 FINDINGS: Right IJ vascular sheath and NG tube remain. Low lung volumes persist with improvement in the central vascular congestion. Persistent perihilar and mid lung atelectasis. No enlarging effusion or pneumothorax. Trachea is midline. IMPRESSION: Improving central vascular congestion pattern. Persistent perihilar and mid lung atelectasis and low lung volumes. Otherwise stable exam. Electronically Signed   By: Jerilynn Mages.  Shick M.D.   On: 11/17/2017 11:41   Dg Chest Port 1 View  Result Date: 11/16/2017 CLINICAL DATA:  Status post aortofemoral bypass. EXAM: PORTABLE CHEST 1 VIEW COMPARISON:  11/15/2017. FINDINGS: The heart remains enlarged. There is increasing BILATERAL perihilar opacities, favored to represent pulmonary edema although pneumonia is not excluded. Fluid in the minor fissure on the RIGHT. Swan-Ganz introducer stable. No osseous findings. No pneumothorax. IMPRESSION: Cardiomegaly. Worsening aeration. Developing pulmonary edema versus BILATERAL infiltrates. Electronically Signed   By: Staci Righter M.D.   On: 11/16/2017 09:06   Dg Chest Port 1 View  Result Date: 11/15/2017 CLINICAL DATA:  Postoperative aortobifemoral bypass. EXAM: PORTABLE CHEST 1 VIEW COMPARISON:  11/13/2017 and prior radiographs FINDINGS: A RIGHT IJ central venous catheter sheath is noted. An NG tube is  present with tip overlying the proximal stomach. Slightly low volume film with pulmonary vascular congestion. No pleural effusion or pneumothorax. IMPRESSION: Pulmonary vascular congestion.  Support apparatus as described. Electronically Signed   By: Margarette Canada M.D.   On: 11/15/2017 17:45   Dg Ang/ext/uni/or Left  Result Date: 12/01/2017 CLINICAL DATA:  Intraoperative arteriogram of the left lower leg EXAM: LEFT ANG/EXT/UNI/ OR FLUOROSCOPY TIME:  Not provided COMPARISON:  None FINDINGS: A single spot intraoperative radiographic image of the left knee provided for review Intra-arterial contrast has been injected from a more proximal location. Images demonstrate the sequela of presumably reverse venous distal femoral bypass grafting. The distal anastomosis appears widely patent without hemodynamically significant stenosis. The distal most aspect of the left superficial femoral artery as well as the left above and below knee popliteal artery appear widely patent. There are mixed occlusive and nonocclusive filling defect within the tibioperoneal trunk with an apparent single vessel runoff to the left lower leg via the posterior tibial artery. Skin staples are noted about the medial aspect of the left lower leg with associated scattered foci of subcutaneous. IMPRESSION: Distal anastomosis presumably reverse venous distal femoral bypass grafting appears widely patent, however there are mixed occlusive and nonocclusive filling defects with tibioperoneal trunk and apparent single-vessel runoff to the left lower leg via the posterior tibial artery. Electronically Signed   By: Sandi Mariscal M.D.   On: 12/01/2017 14:18   Dg Or Local Abdomen  Result Date: 11/15/2017 CLINICAL DATA:  OR miscount. EXAM: OR LOCAL ABDOMEN COMPARISON:  None. FINDINGS: Nasogastric tube tip projects over proximal stomach. Bowel gas pattern is nondilated and nonobstructive. No intra-abdominal mass effect or pathologic calcifications. Multiple  plastic rounded foreign bodies overlie the patient, likely related to patient's gown. Vascular calcifications. Soft tissue planes included osseous structures are no suspicious. IMPRESSION: No unexpected radiopaque foreign bodies. Nasogastric  tube tip projects in the proximal stomach. Results relayed to OR 12 by interpreting radiologist on 11/15/2017 at 3:58 pm. Electronically Signed   By: Elon Alas M.D.   On: 11/15/2017 15:58   Anti-infectives: Anti-infectives (From admission, onward)   Start     Dose/Rate Route Frequency Ordered Stop   12/01/17 2000  ceFAZolin (ANCEF) IVPB 2g/100 mL premix     2 g 200 mL/hr over 30 Minutes Intravenous Every 8 hours 12/01/17 1504 12/02/17 0459      Assessment/Plan: s/p Procedure(s): THROMBECTOMY OF LEFT AORTAFEMORAL BYPASS, THROMBECTOMY OF TIBIAL ARTERY (Left) BYPASS GRAFT FEMORAL- ABOVE KNEE POPLITEAL ARTERY WITH VEIN (Left) INTRA OPERATIVE ARTERIOGRAM OF LEFT LEG (Left) Will begin to mobilize today.  Discontinue Foley..  Again discussed very unusual circumstances of left limb occlusion and left tibial thrombus with the patient   LOS: 1 day   Todd Early 12/02/2017, 8:38 AM

## 2017-12-02 NOTE — Evaluation (Signed)
Occupational Therapy Evaluation Patient Details Name: Tony [name] MRN: [id number] DOB: 1965/08/09 Today's Date: 12/02/2017    History of Present Illness 52 y.o. male PMH significant of R aortobifemoral bypass anastomosis on 11-15-17, HTN, IBS, and GERD Presenting to ED with pain, coldness, and numbness of left foot. s/p aortofemoral bypass, left femoral to above-knee popliteal bypass 6/22.     Clinical Impression   PTA, pt living at home with wife and performing BADLs and using RW for functional mobility. Pt and wife reporting that since last admission pt's activity tolerance was limited by pain.  Pt currently requiring Min A for LB ADLs and functional transfers with RW. Pt agreeable to stand pivot to recliner for dinner; requiring Max encouragement for participation. Pt limited by significant pain in BLEs (R>L). Pt and wife verbalizing concern about dc home. Discussed dc options and pt and wife agreeable to post-acute rehab at SNF to increase safety, independence, and pain management. Pending pt progress, recommend dc to SNF for further OT to optimize occupational performance and participation. Pt would benefit from further acute OT to facilitate safe dc.     Follow Up Recommendations  SNF    Equipment Recommendations  None recommended by OT    Recommendations for Other Services PT consult     Precautions / Restrictions Precautions Precautions: Fall Precaution Comments: LLE edema Restrictions Weight Bearing Restrictions: No      Mobility Bed Mobility Overal bed mobility: Needs Assistance Bed Mobility: Supine to Sit     Supine to sit: Min guard;HOB elevated     General bed mobility comments: Min guard A for safety  Transfers Overall transfer level: Needs assistance Equipment used: Rolling walker (2 wheeled) Transfers: Sit to/from [facility] Sit to Stand: Min assist;From elevated surface Stand pivot transfers: Min assist       General transfer  comment: Limited due to pain at right foot. Min A for safety    Balance Overall balance assessment: Modified Independent                                         ADL either performed or assessed with clinical judgement   ADL Overall ADL's : Needs assistance/impaired Eating/Feeding: Set up;Supervision/ safety;Sitting   Grooming: Set up;Supervision/safety;Sitting   Upper Body Bathing: Set up;Supervision/ safety;Sitting   Lower Body Bathing: Minimal assistance;Sit to/from stand   Upper Body Dressing : Set up;Supervision/safety;Sitting   Lower Body Dressing: Sit to/from stand;Minimal assistance Lower Body Dressing Details (indicate cue type and reason): Pt able to don right sock with increased time and effort. Pt requiring Min A for balance in standing Toilet Transfer: RW;Modified Independent;Minimal assistance;Stand-pivot(Simulated to recliner)           Functional mobility during ADLs: Minimal assistance;Rolling walker(Stand pivot only due to pain) General ADL Comments: Pt with edema in RLE and having significant pain. Noted darkened color at toes. Pt limited by pain and weakness. Wife present and reporting that pt has decreased activity with pain.      Vision Patient Visual Report: No change from baseline       Perception     Praxis      Pertinent Vitals/Pain Pain Assessment: Faces Faces Pain Scale: Hurts whole lot Pain Location: rt foot (worse than LLE or lt foot) Pain Descriptors / Indicators: Grimacing;Throbbing;Crying Pain Intervention(s): Monitored during session;Limited activity within patient's tolerance;Repositioned;Patient requesting pain meds-RN notified  Hand Dominance Right   Extremity/Trunk Assessment     Lower Extremity Assessment RLE Deficits / Details: Rt toes ischemic/black; unable to tolerate strength testing RLE: Unable to fully assess due to pain LLE Deficits / Details: limited by pain LLE: Unable to fully assess due to  pain   Cervical / Trunk Assessment Cervical / Trunk Assessment: Other exceptions Cervical / Trunk Exceptions: guarding abdomen and maintaining flexed trunk   Communication Communication Communication: No difficulties   Cognition Arousal/Alertness: Awake/alert Behavior During Therapy: WFL for tasks assessed/performed Overall Cognitive Status: Within Functional Limits for tasks assessed Area of Impairment: Safety/judgement;Memory                 Orientation Level: Disoriented to;Time   Memory: Decreased short-term memory Following Commands: Follows one step commands with increased time Safety/Judgement: Decreased awareness of safety     General Comments: Pt presenting with decreased ST memory. At end of session, reviewing education provided to pt throughout session. Pt unable to recalling compensaotry tehcniques for LB dressing, tub transfers, or    General Comments  Wife present throughout and reporting that at last dc home, pt with difficulty participating in activity due to pain. Discussed dc options and possible option of dc to SNF. Pt and wife are agreeable to this option. Wife stating "that is what I feel he really needs. At home, I can't manage his pain and I can't get him up."    Exercises General Exercises - Lower Extremity Hip Flexion/Marching: (AAROM on LLE)   Shoulder Instructions      Home Living Family/patient expects to be discharged to:: Private residence Living Arrangements: Spouse/significant other;Children Available Help at Discharge: Available 24 hours/day;Family Type of Home: House Home Access: Stairs to enter [employer] of Steps: 3 Entrance Stairs-Rails: Right Home Layout: Two level;1/2 bath on main level;Able to live on main level with bedroom/bathroom Alternate Level Stairs-Number of Steps: 12 Alternate Level Stairs-Rails: Right Bathroom Shower/Tub: Teacher, early years/pre: Standard     Home Equipment: Environmental consultant - 2  wheels;Cane - single point;Crutches;Bedside commode   Additional Comments: Bed and full bath on 2nd floor. Pt spouse reports he would be able to sleep on couch and use 1/2 bath on main level if unable to navigate stairs in home      Prior Functioning/Environment Level of Independence: Independent with assistive device(s)        Comments: pt was still using RW s/p RLE BPG 11/15/17        OT Problem List: Decreased strength;Decreased activity tolerance;Impaired balance (sitting and/or standing);Decreased cognition;Decreased safety awareness;Decreased knowledge of use of DME or AE;Decreased knowledge of precautions;Cardiopulmonary status limiting activity;Pain      OT Treatment/Interventions: Self-care/ADL training;Therapeutic exercise;Energy conservation;DME and/or AE instruction;Therapeutic activities;Cognitive remediation/compensation;Patient/family education;Balance training;Manual therapy    OT Goals(Current goals can be found in the care plan section) Acute Rehab OT Goals Patient Stated Goal: have pain under control before return home OT Goal Formulation: With patient Time For Goal Achievement: 12/16/17 Potential to Achieve Goals: Good ADL Goals Pt Will Perform Grooming: with modified independence;standing Pt Will Perform Upper Body Dressing: with modified independence;sitting Pt Will Perform Lower Body Dressing: with modified independence;sit to/from stand Pt Will Transfer to Toilet: with modified independence;ambulating;bedside commode Pt Will Perform Toileting - Clothing Manipulation and hygiene: with modified independence;sit to/from stand  OT Frequency: Min 3X/week   Barriers to D/C:            Co-evaluation  AM-PAC PT "6 Clicks" Daily Activity     Outcome Measure Help from another person eating meals?: None Help from another person taking care of personal grooming?: None Help from another person toileting, which includes using toliet, bedpan, or  urinal?: None Help from another person bathing (including washing, rinsing, drying)?: A Little Help from another person to put on and taking off regular upper body clothing?: A Little Help from another person to put on and taking off regular lower body clothing?: A Little 6 Click Score: 21   End of Session Equipment Utilized During Treatment: Rolling walker Nurse Communication: Mobility status;Precautions;Patient requests pain meds  Activity Tolerance: Patient limited by pain Patient left: with call bell/phone within reach;in chair;with family/visitor present  OT Visit Diagnosis: Unsteadiness on feet (R26.81);Muscle weakness (generalized) (M62.81)                Time: 5397-6734 OT Time Calculation (min): 28 min Charges:  OT General Charges $OT Visit: 1 Visit OT Evaluation $OT Eval Moderate Complexity: 1 Mod OT Treatments $Self Care/Home Management : 8-22 mins G-Codes:     Elaura Calix MSOT, OTR/L Acute Rehab Pager: [phone] Office: [place] 12/02/2017, 5:21 PM

## 2017-12-02 NOTE — Evaluation (Signed)
Physical Therapy Evaluation Patient Details Name: Tony [name] MRN: [id number] DOB: 05/16/1966 Today's Date: 12/02/2017   History of Present Illness  52 y.o. male PMH significant of R aortobifemoral bypass anastomosis on 11-15-17, HTN, IBS, and GERD Presenting to ED with pain, coldness, and numbness of left foot. s/p aortofemoral bypass, left femoral to above-knee popliteal bypass 6/22.      Clinical Impression  Patient is s/p above surgery resulting in functional limitations due to the deficits listed below (see PT Problem List). Patient limited by pain (despite pre-medication). Reports he had progressed to walking in the neighborhood with his RW after 11/15/17 surgery. Anticipate excellent progress as pain control improves. Patient will benefit from skilled PT to increase their independence and safety with mobility to allow discharge to the venue listed below.       Follow Up Recommendations No PT follow up;Supervision - Intermittent  (pt reports he does not need HHPT; reports he knows how to use crutches and cane from prior knee surgery in the past and able to accurately describe proper use)    Equipment Recommendations  None recommended by PT    Recommendations for Other Services OT consult     Precautions / Restrictions Precautions Precautions: Fall      Mobility  Bed Mobility Overal bed mobility: Needs Assistance Bed Mobility: Supine to Sit     Supine to sit: Supervision;HOB elevated(+use of rail; multiple lines)        Transfers Overall transfer level: Needs assistance Equipment used: Rolling walker (2 wheeled) Transfers: Sit to/from Stand Sit to Stand: Min guard         General transfer comment: slow, and cautious, limited by pain in foot.  Ambulation/Gait             General Gait Details: unable due to pain; pivotal steps to chair only  Stairs            Wheelchair Mobility    Modified Rankin (Stroke Patients Only)       Balance Overall  balance assessment: Modified Independent                                           Pertinent Vitals/Pain Pain Assessment: 0-10 Pain Score: 8  Pain Location: rt foot (worse than LLE or lt foot) Pain Descriptors / Indicators: Grimacing;Throbbing Pain Intervention(s): Limited activity within patient's tolerance;Premedicated before session    [place] expects to be discharged to:: Private residence Living Arrangements: Spouse/significant other;Children Available Help at Discharge: Available 24 hours/day;Family Type of Home: House Home Access: Stairs to enter Entrance Stairs-Rails: Right Entrance Stairs-Number of Steps: 3 Home Layout: Two level;1/2 bath on main level;Able to live on main level with bedroom/bathroom Home Equipment: Gilford Rile - 2 wheels;Cane - single point;Crutches;Bedside commode      Prior Function Level of Independence: Independent with assistive device(s)         Comments: pt was still using RW s/p RLE BPG 11/15/17     Hand Dominance   Dominant Hand: Right    Extremity/Trunk Assessment   Upper Extremity Assessment Upper Extremity Assessment: Overall WFL for tasks assessed;Defer to OT evaluation    Lower Extremity Assessment Lower Extremity Assessment: RLE deficits/detail;LLE deficits/detail RLE Deficits / Details: Rt toes ischemic/black; unable to tolerate strength testing RLE: Unable to fully assess due to pain LLE Deficits / Details: limited by pain LLE: Unable to  fully assess due to pain       Communication   Communication: No difficulties  Cognition Arousal/Alertness: Awake/alert Behavior During Therapy: WFL for tasks assessed/performed Overall Cognitive Status: Within Functional Limits for tasks assessed                                        General Comments      Exercises General Exercises - Lower Extremity Ankle Circles/Pumps: AROM;Both;10 reps Long Arc Quad: AROM;Both;5 reps;Seated    Assessment/Plan    PT Assessment Patient needs continued PT services  PT Problem List Decreased strength;Decreased mobility;Pain;Decreased knowledge of use of DME;Decreased activity tolerance;Decreased range of motion;Decreased balance       PT Treatment Interventions DME instruction;Functional mobility training;Balance training;Patient/family education;Gait training;Therapeutic activities;Stair training;Therapeutic exercise    PT Goals (Current goals can be found in the Care Plan section)  Acute Rehab PT Goals Patient Stated Goal: have pain under control before return home PT Goal Formulation: With patient Time For Goal Achievement: 12/16/17 Potential to Achieve Goals: Good    Frequency Min 3X/week   Barriers to discharge        Co-evaluation               AM-PAC PT "6 Clicks" Daily Activity  Outcome Measure Difficulty turning over in bed (including adjusting bedclothes, sheets and blankets)?: None Difficulty moving from lying on back to sitting on the side of the bed? : A Little Difficulty sitting down on and standing up from a chair with arms (e.g., wheelchair, bedside commode, etc,.)?: A Little Help needed moving to and from a bed to chair (including a wheelchair)?: A Little Help needed walking in hospital room?: A Lot Help needed climbing 3-5 steps with a railing? : A Lot 6 Click Score: 17    End of Session   Activity Tolerance: Patient limited by pain Patient left: in chair;with call bell/phone within reach Nurse Communication: Mobility status PT Visit Diagnosis: Other abnormalities of gait and mobility (R26.89);Pain Pain - Right/Left: Right Pain - part of body: Ankle and joints of foot    Time: 1136-1205 PT Time Calculation (min) (ACUTE ONLY): 29 min   Charges:   PT Evaluation $PT Eval Low Complexity: 1 Low PT Treatments $Therapeutic Exercise: 8-22 mins   PT G Codes:          [employer], PT 12/02/2017, 12:17 PM

## 2017-12-03 ENCOUNTER — Other Ambulatory Visit: Payer: Self-pay

## 2017-12-03 ENCOUNTER — Encounter (HOSPITAL_COMMUNITY): Payer: Self-pay | Admitting: *Deleted

## 2017-12-03 MED ORDER — RIVAROXABAN 15 MG PO TABS
15.0000 mg | ORAL_TABLET | Freq: Two times a day (BID) | ORAL | Status: DC
  Administered 2017-12-03 – 2017-12-05 (×6): 15 mg via ORAL
  Filled 2017-12-03 (×6): qty 1

## 2017-12-03 MED ORDER — RIVAROXABAN 15 MG PO TABS: 15.0000 mg | ORAL_TABLET | Freq: Two times a day (BID) | ORAL | Status: DC

## 2017-12-03 MED ORDER — RIVAROXABAN 20 MG PO TABS: 20.0000 mg | ORAL_TABLET | Freq: Every day | ORAL | Status: DC

## 2017-12-03 NOTE — Progress Notes (Signed)
Physical Therapy Treatment Patient Details Name: Tony [name] MRN: [id number] DOB: April 07, 1966 Today's Date: 12/03/2017    History of Present Illness 52 y.o. male PMH significant of R aortobifemoral bypass anastomosis on 11-15-17, HTN, IBS, and GERD Presenting to ED with pain, coldness, and numbness of left foot. s/p aortofemoral bypass, left femoral to above-knee popliteal bypass 6/22.      PT Comments    Pt admitted with above diagnosis. Pt currently with functional limitations due to the deficits listed below (see PT Problem List). Pt was able to ambulate but with incr pain and limited distance.  Flexed posture with pt relying heavily on UEs.  Recommend short term SNF.  Pt will benefit from skilled PT to increase their independence and safety with mobility to allow discharge to the venue listed below.     Follow Up Recommendations  SNF;Supervision/Assistance - 24 hour     Equipment Recommendations  None recommended by PT    Recommendations for Other Services OT consult     Precautions / Restrictions Precautions Precautions: Fall Precaution Comments: LLE edema Restrictions Weight Bearing Restrictions: No    Mobility  Bed Mobility Overal bed mobility: Needs Assistance Bed Mobility: Supine to Sit Rolling: Min guard   Supine to sit: Min guard;HOB elevated     General bed mobility comments: Min guard A for safety  Transfers Overall transfer level: Needs assistance Equipment used: Rolling walker (2 wheeled) Transfers: Sit to/from [facility] Sit to Stand: Min assist;From elevated surface         General transfer comment: Limited due to pain at right foot. Min A for safety  Ambulation/Gait Ambulation/Gait assistance: Min guard Gait Distance (Feet): 45 Feet Assistive device: Rolling walker (2 wheeled) Gait Pattern/deviations: Decreased stance time - right;Decreased stride length;Trunk flexed;Step-to pattern;Decreased weight shift to right Gait  velocity: decr Gait velocity interpretation: <1.31 ft/sec, indicative of household ambulator General Gait Details: limited distance due to pain.  Good safety with RW just difficult to progress   Stairs             Wheelchair Mobility    Modified Rankin (Stroke Patients Only)       Balance Overall balance assessment: Needs assistance Sitting-balance support: No upper extremity supported;Feet supported Sitting balance-Leahy Scale: Fair     Standing balance support: Bilateral upper extremity supported;During functional activity Standing balance-Leahy Scale: Poor Standing balance comment: relies on UE support                            Cognition Arousal/Alertness: Awake/alert Behavior During Therapy: WFL for tasks assessed/performed Overall Cognitive Status: Within Functional Limits for tasks assessed Area of Impairment: Safety/judgement;Memory                 Orientation Level: Disoriented to;Time   Memory: Decreased short-term memory Following Commands: Follows one step commands with increased time Safety/Judgement: Decreased awareness of safety            Exercises General Exercises - Lower Extremity Ankle Circles/Pumps: AROM;Both;10 reps Long Arc Quad: AROM;Both;5 reps;Seated    General Comments        Pertinent Vitals/Pain Pain Assessment: Faces Faces Pain Scale: Hurts whole lot Pain Location: rt foot (worse than LLE or lt foot) Pain Descriptors / Indicators: Grimacing;Throbbing;Crying Pain Intervention(s): Limited activity within patient's tolerance;Monitored during session;Premedicated before session;Repositioned    Home Living  Prior Function            PT Goals (current goals can now be found in the care plan section) Acute Rehab PT Goals Patient Stated Goal: have pain under control before return home Progress towards PT goals: Progressing toward goals    Frequency    Min 3X/week       PT Plan Discharge plan needs to be updated    Co-evaluation              AM-PAC PT "6 Clicks" Daily Activity  Outcome Measure  Difficulty turning over in bed (including adjusting bedclothes, sheets and blankets)?: None Difficulty moving from lying on back to sitting on the side of the bed? : None Difficulty sitting down on and standing up from a chair with arms (e.g., wheelchair, bedside commode, etc,.)?: A Little Help needed moving to and from a bed to chair (including a wheelchair)?: A Little Help needed walking in hospital room?: A Little Help needed climbing 3-5 steps with a railing? : A Lot 6 Click Score: 19    End of Session Equipment Utilized During Treatment: Gait belt Activity Tolerance: Patient limited by pain;Patient limited by fatigue Patient left: in chair;with call bell/phone within reach Nurse Communication: Mobility status PT Visit Diagnosis: Other abnormalities of gait and mobility (R26.89);Pain Pain - Right/Left: Right Pain - part of body: Ankle and joints of foot     Time: 1129-1141 PT Time Calculation (min) (ACUTE ONLY): 12 min  Charges:  $Gait Training: 8-22 mins                    G Codes:       Tony [name],PT Acute Rehabilitation [phone] (pager)    [name] 12/03/2017, 12:55 PM

## 2017-12-03 NOTE — Progress Notes (Signed)
   VASCULAR SURGERY ASSESSMENT & PLAN:   2 Days Post-Op s/p:  Thrombectomy left limb aortofemoral bypass  Left femoral to above-knee popliteal bypass with non-reversed great saphenous vein  Tibial thrombectomy and intraoperative arteriogram  Brisk doppler signals both feet  I think that he will need anticoagulation and then hypercoag w/u in 3 months (with Hematology appt). Xarelto would be my first choice if he can afford it. Will start today.   Wants to go to a SNF after D/C until he is stronger  SUBJECTIVE:   Pain well contolled  PHYSICAL EXAM:   Vitals:   12/02/17 0700 12/02/17 1452 12/02/17 1926 12/03/17 0332  BP: (!) 156/88  (!) 165/81 (!) 151/82  Pulse: 94  (!) 117 87  Resp: 13  15 14   Temp:  99.4 F (37.4 C)  98.9 F (37.2 C)  TempSrc:  Oral  Oral  SpO2: 100%  98% 98%   Brisk doppler signals both feet Incisions look fine  LABS:   No new labs  PROBLEM LIST:    Active Problems:   PVD (peripheral vascular disease) (HCC)   CURRENT MEDS:   . atorvastatin  40 mg Oral q1800  . docusate sodium  100 mg Oral Daily  . gabapentin  200 mg Oral BID  . heparin  5,000 Units Subcutaneous Q8H  . hydrochlorothiazide  25 mg Oral Daily  . irbesartan  37.5 mg Oral Daily  . metoprolol succinate  100 mg Oral QHS  . pantoprazole  40 mg Oral Daily    Deitra Mayo Beeper: [phone] Office: [phone] 12/03/2017

## 2017-12-03 NOTE — Progress Notes (Signed)
ANTICOAGULATION CONSULT NOTE - Initial Consult  Pharmacy Consult for Xarelto Indication: Hypercoagulable state  Allergies  Allergen Reactions  . Pork-Derived Products     UNSPECIFIED REASON Pt doesn't eat pork  . Morphine And Related Other (See Comments)    hallucinations    Patient Measurements:   Heparin Dosing Weight: n/a   Vital Signs: Temp: 98.9 F (37.2 C) (06/24 0332) Temp Source: Oral (06/24 0332) BP: 151/82 (06/24 0332) Pulse Rate: 87 (06/24 0332)  Labs: Recent Labs    12/01/17 0728 12/02/17 0050  HGB 10.5* 8.1*  HCT 32.4* 26.2*  PLT 360 314  CREATININE 0.93 0.94    Estimated Creatinine Clearance: 107.4 mL/min (by C-G formula based on SCr of 0.94 mg/dL).   Medical History: Past Medical History:  Diagnosis Date  . Anxiety   . Arterial occlusive disease    multilevel arterial occlusive disease/notes 11/13/2017  . Arthritis    "left knee" (11/13/2017)  . Chronic back pain   . Chronic lower back pain   . Depression   . Gastric ulcer due to Helicobacter pylori 4315  . GERD (gastroesophageal reflux disease)   . High cholesterol   . Hypertension   . IBS (irritable bowel syndrome)   . Mesenteric adenitis 2011   presumed/H&P  . Migraines    "use to suffer migraines years ago" (11/13/2017)  . Peripheral vascular disease ([place])   . Pneumonia 2011  . Ulcer     Medications:  Medications Prior to Admission  Medication Sig Dispense Refill Last Dose  . atorvastatin (LIPITOR) 40 MG tablet Take 1 tablet (40 mg total) by mouth daily. 30 tablet 1 11/30/2017 at Unknown time  . gabapentin (NEURONTIN) 100 MG capsule Take 2 capsules (200 mg total) by mouth 2 (two) times daily. 60 capsule 2 11/30/2017 at Unknown time  . hydrochlorothiazide (HYDRODIURIL) 25 MG tablet Take 25 mg by mouth daily.   11/30/2017 at Unknown time  . LORazepam (ATIVAN) 1 MG tablet Take 1 mg by mouth every 8 (eight) hours as needed for anxiety.   unk  . meloxicam (MOBIC) 15 MG tablet Take 15 mg by  mouth daily as needed for pain.   unk  . metoprolol succinate (TOPROL-XL) 100 MG 24 hr tablet Take 100 mg by mouth at bedtime. Take with or immediately following a meal.   11/30/2017 at 0800  . oxyCODONE-acetaminophen (PERCOCET/ROXICET) 5-325 MG tablet Take 1-2 tablets by mouth every 6 (six) hours as needed for severe pain. 30 tablet 0 11/30/2017 at Unknown time  . valsartan (DIOVAN) 160 MG tablet Take 160 mg by mouth daily.  4 11/30/2017 at Unknown time  . gabapentin (NEURONTIN) 300 MG capsule Take 1 capsule (300 mg total) by mouth 3 (three) times daily. (Patient not taking: Reported on 12/01/2017) 90 capsule 1 Not Taking at Unknown time  . oxyCODONE-acetaminophen (PERCOCET/ROXICET) 5-325 MG tablet Take 1-2 tablets by mouth every 4 (four) hours as needed for severe pain. (Patient not taking: Reported on 12/01/2017) 40 tablet 0 Not Taking at Unknown time    Assessment: 43 YOM here with profound ischemia of left foot s/p left extremity thrombectomy. Pt is to start Xarelto. H/H down to 8.1/26.2, Plt wnl. SCr wnl.   Goal of Therapy:  Monitor platelets by anticoagulation protocol: Yes   Plan:  -Xarelto 15 mg twice daily x 21 days, then Xarelto 20 mg daily  -Monitor for s/s of bleeding -Educate patient on Xarelto  -D/c subq heparin  Albertina Parr, PharmD., BCPS Clinical Pharmacist Clinical phone  for 12/03/17 until 3:30pm: [phone] If after 3:30pm, please call main pharmacy at: [phone]

## 2017-12-03 NOTE — Discharge Instructions (Signed)
Information on my medicine - XARELTO (rivaroxaban)  This medication education was reviewed with me or my healthcare representative as part of my discharge preparation.  The pharmacist that spoke with me during my hospital stay was:  Lavenia Atlas, [place]? Xarelto was prescribed to treat blood clots that may have been found in the veins of your legs (deep vein thrombosis) or in your lungs (pulmonary embolism) and to reduce the risk of them occurring again.  What do you need to know about Xarelto? The starting dose is one 15 mg tablet taken TWICE daily with food for the FIRST 21 DAYS then on (enter date)  12/24/2017  the dose is changed to one 20 mg tablet taken ONCE A DAY with your evening meal.  DO NOT stop taking Xarelto without talking to the health care provider who prescribed the medication.  Refill your prescription for 20 mg tablets before you run out.  After discharge, you should have regular check-up appointments with your healthcare provider that is prescribing your Xarelto.  In the future your dose may need to be changed if your kidney function changes by a significant amount.  What do you do if you miss a dose? If you are taking Xarelto TWICE DAILY and you miss a dose, take it as soon as you remember. You may take two 15 mg tablets (total 30 mg) at the same time then resume your regularly scheduled 15 mg twice daily the next day.  If you are taking Xarelto ONCE DAILY and you miss a dose, take it as soon as you remember on the same day then continue your regularly scheduled once daily regimen the next day. Do not take two doses of Xarelto at the same time.   Important Safety Information Xarelto is a blood thinner medicine that can cause bleeding. You should call your healthcare provider right away if you experience any of the following: ? Bleeding from an injury or your nose that does not stop. ? Unusual colored urine (red or dark  brown) or unusual colored stools (red or black). ? Unusual bruising for unknown reasons. ? A serious fall or if you hit your head (even if there is no bleeding).  Some medicines may interact with Xarelto and might increase your risk of bleeding while on Xarelto. To help avoid this, consult your healthcare provider or pharmacist prior to using any new prescription or non-prescription medications, including herbals, vitamins, non-steroidal anti-inflammatory drugs (NSAIDs) and supplements.  This website has more information on Xarelto: [email or web address]

## 2017-12-03 NOTE — Progress Notes (Signed)
Per insurance check on Xarelto  Xeralto 20 mg. Co-pay zero.  No PA required  Pharmacy: [facility]   Thank-you

## 2017-12-04 ENCOUNTER — Inpatient Hospital Stay (HOSPITAL_COMMUNITY): Payer: BLUE CROSS/BLUE SHIELD

## 2017-12-04 DIAGNOSIS — Z9889 Other specified postprocedural states: Secondary | ICD-10-CM

## 2017-12-04 NOTE — Care Management Note (Signed)
Case Management Note Marvetta Gibbons RN, BSN Unit 4E-Case Manager [phone]  Patient Details  Name: Tony [name] MRN: [id number] Date of Birth: Oct 24, 1965  Subjective/Objective:   Pt admitted s/p Thrombectomy left limb aortofemoral bypass  Left femoral to above-knee popliteal bypass with non-reversed great saphenous vein  Tibial thrombectomy and intraoperative arteriogram              Action/Plan: PTA pt lived at home with wife- recent discharge after having Aorto bifemoral bypass and right femoral to above-knee popliteal artery bypass with vein. Pt had [place] services set up with [facility] for HHPT/OT and had RW. Per PT eval on this admission recommendation for STSNF- CSW has been consulted for possible placement. Referral received for Xarelto- per insurance check- pt has zero dollar copay- this info was provided to pt and wife at the bedside. CM will follow for transition of care needs pending SNF placement.    Expected Discharge Date:                  Expected Discharge Plan:  [place]  In-House Referral:  Clinical Social Work  Discharge planning Services  CM Consult, Medication Assistance  Post Acute Care Choice:  Home Health, Resumption of Svcs/PTA Provider Choice offered to:  Patient, Spouse  DME Arranged:    DME Agency:     HH Arranged:    [place] Agency:     Status of Service:  In process, will continue to follow  If discussed at Long Length of Stay Meetings, dates discussed:    Discharge Disposition:   Additional Comments:  Dawayne Patricia, RN 12/04/2017, 4:07 PM

## 2017-12-04 NOTE — NC FL2 (Signed)
[place] LEVEL OF CARE SCREENING TOOL     IDENTIFICATION  Patient Name: Tony [name] Birthdate: February 02, 1966 Sex: male Admission Date (Current Location): 12/01/2017  [facility] and [place] Number:  Herbalist and Address:  [facility], [place] [address], [place],  [address]      Provider Number: [id number]  Attending Physician Name and Address:  Rosetta Posner, MD  Relative Name and Phone Number:  Ayren Zumbro, [phone]    Current Level of Care: Hospital Recommended Level of Care: [place] Prior Approval Number:    Date Approved/Denied:   PASRR Number: [id number]  Discharge Plan: SNF    Current Diagnoses: Patient Active Problem List   Diagnosis Date Noted  . Aortoiliac occlusive disease ([place]) 11/15/2017  . Pain of right lower extremity due to ischemia 11/13/2017  . Hyperlipidemia LDL goal <70 11/13/2017  . PVD (peripheral vascular disease) ([place]) 11/11/2017  . H. pylori infection 10/11/2011  . Duodenal ulcer 08/23/2011  . IBS (irritable bowel syndrome) 08/18/2011  . Depression with anxiety 05/11/2009  . EPIGASTRIC PAIN, CHRONIC 05/11/2009  . SMOKER 09/01/2008  . INSOMNIA, CHRONIC 09/01/2008  . Essential hypertension 09/01/2008  . PALPITATIONS, CHRONIC 09/01/2008  . GASTRITIS 10/24/2007    Orientation RESPIRATION BLADDER Height & Weight     Self, Time, Situation, Place  Normal Continent Weight: 179 lb 14.3 oz (81.6 kg) Height:  6\' 1"  (185.4 cm)  BEHAVIORAL SYMPTOMS/MOOD NEUROLOGICAL BOWEL NUTRITION STATUS      Continent Diet(heart healthy)  AMBULATORY STATUS COMMUNICATION OF NEEDS Skin   Extensive Assist Verbally                         Personal Care Assistance Level of Assistance  Bathing, Feeding, Dressing Bathing Assistance: Limited assistance Feeding assistance: Independent Dressing Assistance: Limited assistance     Functional Limitations Info  Sight, Hearing, Speech  Sight Info: Adequate Hearing Info: Adequate Speech Info: Adequate    SPECIAL CARE FACTORS FREQUENCY  PT (By licensed PT), OT (By licensed OT)     PT Frequency: 5x wk OT Frequency: 5x wk            Contractures Contractures Info: Not present    Additional Factors Info  Code Status, Allergies Code Status Info: full code Allergies Info: PORK-DERIVED PRODUCTS, MORPHINE AND RELATED           Current Medications (12/04/2017):  This is the current hospital active medication list Current Facility-Administered Medications  Medication Dose Route Frequency Provider Last Rate Last Dose  . 0.9 %  sodium chloride infusion  500 mL Intravenous Once PRN Early, Arvilla Meres, MD      . acetaminophen (TYLENOL) tablet 325-650 mg  325-650 mg Oral Q4H PRN Rosetta Posner, MD   650 mg at 12/03/17 2247   Or  . acetaminophen (TYLENOL) suppository 325-650 mg  325-650 mg Rectal Q4H PRN Early, Arvilla Meres, MD      . alum & mag hydroxide-simeth (MAALOX/MYLANTA) 200-200-20 MG/5ML suspension 15-30 mL  15-30 mL Oral Q2H PRN Early, Arvilla Meres, MD      . atorvastatin (LIPITOR) tablet 40 mg  40 mg Oral [id number] Rosetta Posner, MD   40 mg at 12/03/17 1631  . dextrose 5 % and 0.45 % NaCl with KCl 20 mEq/L infusion   Intravenous Continuous Early, Arvilla Meres, MD 100 mL/hr at 12/02/17 0221    . docusate sodium (COLACE) capsule 100 mg  100 mg Oral Daily Early, Arvilla Meres, MD   100 mg at 12/03/17 0900  . gabapentin (NEURONTIN) capsule 200 mg  200 mg Oral BID Rosetta Posner, MD   200 mg at 12/04/17 0858  . guaiFENesin-dextromethorphan (ROBITUSSIN DM) 100-10 MG/5ML syrup 15 mL  15 mL Oral Q4H PRN Early, Arvilla Meres, MD      . hydrALAZINE (APRESOLINE) injection 5 mg  5 mg Intravenous Q20 Min PRN Early, Arvilla Meres, MD      . hydrochlorothiazide (HYDRODIURIL) tablet 25 mg  25 mg Oral Daily Early, Arvilla Meres, MD   25 mg at 12/04/17 0859  . HYDROmorphone (DILAUDID) injection 1 mg  1 mg Intravenous Q2H PRN Early, Arvilla Meres, MD   1 mg at 12/04/17 1047  . irbesartan  (AVAPRO) tablet 37.5 mg  37.5 mg Oral Daily Early, Arvilla Meres, MD   37.5 mg at 12/03/17 0900  . labetalol (NORMODYNE,TRANDATE) injection 10 mg  10 mg Intravenous Q10 min PRN Early, Arvilla Meres, MD      . LORazepam (ATIVAN) tablet 1 mg  1 mg Oral Q8H PRN Early, Arvilla Meres, MD   1 mg at 12/04/17 0518  . meloxicam (MOBIC) tablet 15 mg  15 mg Oral Daily PRN Early, Arvilla Meres, MD      . metoprolol succinate (TOPROL-XL) 24 hr tablet 100 mg  100 mg Oral QHS Rosetta Posner, MD   100 mg at 12/03/17 2247  . morphine 2 MG/ML injection 2-5 mg  2-5 mg Intravenous Q1H PRN Early, Arvilla Meres, MD      . ondansetron [facility]) injection 4 mg  4 mg Intravenous Q6H PRN Early, Arvilla Meres, MD      . oxyCODONE-acetaminophen (PERCOCET/ROXICET) 5-325 MG per tablet 1-2 tablet  1-2 tablet Oral Q6H PRN Early, Arvilla Meres, MD   2 tablet at 12/04/17 0512  . pantoprazole (PROTONIX) EC tablet 40 mg  40 mg Oral Daily Early, Arvilla Meres, MD   40 mg at 12/04/17 0857  . phenol (CHLORASEPTIC) mouth spray 1 spray  1 spray Mouth/Throat PRN Early, Arvilla Meres, MD      . potassium chloride SA (K-DUR,KLOR-CON) CR tablet 20-40 mEq  20-40 mEq Oral Once PRN Early, Arvilla Meres, MD      . Rivaroxaban [facility]) tablet 15 mg  15 mg Oral BID WC MancherilDarnell Level, RPH   15 mg at 12/04/17 0857  . [START ON 12/24/2017] rivaroxaban (XARELTO) tablet 20 mg  20 mg Oral Q supper Mancheril, Darnell Level, [facility]         Discharge Medications: Please see discharge summary for a list of discharge medications.  Relevant Imaging Results:  Relevant Lab Results:   Additional Information SS# [id number]  Wende Neighbors, LCSW

## 2017-12-04 NOTE — Progress Notes (Signed)
ABI's have been completed. Right 0.88 Left 0.69  12/04/17 12:10 PM Tony [name] RVT

## 2017-12-04 NOTE — Clinical Social Work Note (Signed)
Clinical Social Work Assessment  Patient Details  Name: Tony [name] MRN: [id number] Date of Birth: 1965-09-03  Date of referral:  12/04/17               Reason for consult:  Discharge Planning, Facility Placement                Permission sought to share information with:  Family Supports Permission granted to share information::  Yes, Verbal Permission Granted  Name::     Tony [name]  Agency::  snf  Relationship::  spouse  Contact Information:  [phone]  Housing/Transportation Living arrangements for the past 2 months:  Single Family Home Source of Information:  Patient Patient Interpreter Needed:  None Criminal Activity/Legal Involvement Pertinent to Current Situation/Hospitalization:  No - Comment as needed Significant Relationships:  Adult Children, Other Family Members, Spouse Lives with:  Spouse Do you feel safe going back to the place where you live?  Yes Need for family participation in patient care:  Yes (Comment)  Care giving concerns:  No family at bedside. Patient stated he lives with his wife at home and has support from adult children in the area. Patient stated most of his family live in [place] but they have come down to assist .  Facilities manager / plan: CSW met patient at bedside to discuss discharge plan. patient stated he would like to go to rehab to get his strength back up but to also get assistance with pain management. Patient stated he need a lot of outside push to be able to get himself out of bed since he has a low tolerance for pain. CSW went over the process of going to a SNF for rehab. CSW will follow up with patient once bed offers are given.  Employment status:    Insurance information:  Other (Comment Required)(BC/BS) PT Recommendations:  [place] / Referral to community resources:  [facility]  Patient/Family's Response to care:  Patient eager to receive rehab.  Patient/Family's Understanding  of and Emotional Response to Diagnosis, Current Treatment, and Prognosis:  Patient agreeable to discharge to snf. Patient wanting assistance with pain management   Emotional Assessment Appearance:  Appears stated age Attitude/Demeanor/Rapport:  Engaged Affect (typically observed):  Accepting Orientation:  Oriented to Self, Oriented to Place, Oriented to  Time, Oriented to Situation Alcohol / Substance use:    Psych involvement (Current and /or in the community):  No (Comment)  Discharge Needs  Concerns to be addressed:  Care Coordination Readmission within the last 30 days:  No Current discharge risk:  None Barriers to Discharge:  Continued Medical Work up, [place], LCSW 12/04/2017, 1:45 PM

## 2017-12-04 NOTE — Progress Notes (Signed)
   VASCULAR SURGERY ASSESSMENT & PLAN:   Both feet warm and well-perfused.  The toes on the right foot are demarcating.  His Xarelto was approved by insurance.  Discharge to skilled nursing facility once he finds a bed.  This is been recommended for short-term.  SUBJECTIVE:   No specific complaints this morning.  PHYSICAL EXAM:   Vitals:   12/03/17 2246 12/03/17 2332 12/04/17 0511 12/04/17 0519  BP: (!) 152/89 (!) 141/80    Pulse: (!) 107 99    Resp:  20    Temp:  98.4 F (36.9 C)  98.3 F (36.8 C)  TempSrc:  Oral  Oral  SpO2:  100%    Weight:   179 lb 14.3 oz (81.6 kg)   Height:   6\' 1"  (1.854 m)    Both feet are warm and well-perfused. Toes on the right foot are demarcating.  He was in 20  LABS:   PROBLEM LIST:    Active Problems:   PVD (peripheral vascular disease) ([place])  CURRENT MEDS:   . atorvastatin  40 mg Oral q1800  . docusate sodium  100 mg Oral Daily  . gabapentin  200 mg Oral BID  . hydrochlorothiazide  25 mg Oral Daily  . irbesartan  37.5 mg Oral Daily  . metoprolol succinate  100 mg Oral QHS  . pantoprazole  40 mg Oral Daily  . Rivaroxaban  15 mg Oral BID WC  . [START ON 12/24/2017] rivaroxaban  20 mg Oral Q supper    Deitra Mayo Beeper: [phone] Office: [phone] 12/04/2017

## 2017-12-04 NOTE — Progress Notes (Signed)
Clinical Social Worker gave patient and wife list of facilities that have made bed offers. CSW encouraged family to let her know which facility they would like so CSW is able to attain authorization through insurance.CSW awaiting family's decision on rehab.   Rhea Pink, MSW,  [place]

## 2017-12-05 LAB — CBC
HEMATOCRIT: 25.3 % — AB (ref 39.0–52.0)
Hemoglobin: 7.9 g/dL — ABNORMAL LOW (ref 13.0–17.0)
MCH: 29.6 pg (ref 26.0–34.0)
MCHC: 31.2 g/dL (ref 30.0–36.0)
MCV: 94.8 fL (ref 78.0–100.0)
Platelets: 378 10*3/uL (ref 150–400)
RBC: 2.67 MIL/uL — ABNORMAL LOW (ref 4.22–5.81)
RDW: 13.2 % (ref 11.5–15.5)
WBC: 7.6 10*3/uL (ref 4.0–10.5)

## 2017-12-05 LAB — BASIC METABOLIC PANEL
Anion gap: 8 (ref 5–15)
BUN: 7 mg/dL (ref 6–20)
CHLORIDE: 99 mmol/L (ref 98–111)
CO2: 30 mmol/L (ref 22–32)
CREATININE: 1 mg/dL (ref 0.61–1.24)
Calcium: 9 mg/dL (ref 8.9–10.3)
GFR calc Af Amer: >60 mL/min (ref >60)
GFR calc non Af Amer: >60 mL/min (ref >60)
Glucose, Bld: 118 mg/dL — ABNORMAL HIGH (ref 70–99)
POTASSIUM: 3.9 mmol/L (ref 3.5–5.1)
SODIUM: 137 mmol/L (ref 135–145)

## 2017-12-05 MED ORDER — OXYCODONE-ACETAMINOPHEN 5-325 MG PO TABS: 1.0000 | ORAL_TABLET | Freq: Four times a day (QID) | ORAL | 0 refills | Status: DC | PRN

## 2017-12-05 MED ORDER — RIVAROXABAN 20 MG PO TABS: 20.0000 mg | ORAL_TABLET | Freq: Every day | ORAL | 2 refills | Status: DC

## 2017-12-05 MED ORDER — RIVAROXABAN 15 MG PO TABS: 15.0000 mg | ORAL_TABLET | Freq: Two times a day (BID) | ORAL | 0 refills | Status: DC

## 2017-12-05 NOTE — Progress Notes (Signed)
CSW spoke to patient and wife about bed availability. Wife stated she would like patient to discharge to [facility] since she is familiar with facility. CSW contacted [facility] admission coordinator for  Facility and she stated she will be able to offer bed. Facility started authorization through BC/BS this morning. CSW will follow back up with facility once authorization has been attained.    Rhea Pink, MSW,  [place]

## 2017-12-05 NOTE — Care Management Note (Signed)
Case Management Note Marvetta Gibbons RN, BSN Unit 4E-Case Manager [phone]  Patient Details  Name: Tony [name] MRN: [id number] Date of Birth: [birth year]  Subjective/Objective:   Pt admitted s/p Thrombectomy left limb aortofemoral bypass  Left femoral to above-knee popliteal bypass with non-reversed great saphenous vein  Tibial thrombectomy and intraoperative arteriogram              Action/Plan: PTA pt lived at home with wife- recent discharge after having Aorto bifemoral bypass and right femoral to above-knee popliteal artery bypass with vein. Pt had [place] services set up with [facility] for HHPT/OT and had RW. Per PT eval on this admission recommendation for STSNF- CSW has been consulted for possible placement. Referral received for Xarelto- per insurance check- pt has zero dollar copay- this info was provided to pt and wife at the bedside. CM will follow for transition of care needs pending SNF placement.    Expected Discharge Date:  12/05/17               Expected Discharge Plan:  [place]  In-House Referral:  Clinical Social Work  Discharge planning Services  CM Consult, Medication Assistance  Post Acute Care Choice:  Home Health, Resumption of Svcs/PTA Provider Choice offered to:  Patient, Spouse  DME Arranged:    DME Agency:     HH Arranged:    [place] Agency:     Status of Service:  Completed, signed off  If discussed at [employer] of Stay Meetings, dates discussed:    Discharge Disposition: skilled facility   Additional Comments:  Dawayne Patricia, RN 12/05/2017, 4:37 PM

## 2017-12-05 NOTE — Progress Notes (Signed)
Physical Therapy Treatment Patient Details Name: Tony [name] MRN: [id number] DOB: Jul 24, 1965 Today's Date: 12/05/2017    History of Present Illness 52 y.o. male PMH significant of R aortobifemoral bypass anastomosis on 11-15-17, HTN, IBS, and GERD Presenting to ED with pain, coldness, and numbness of left foot. s/p aortofemoral bypass, left femoral to above-knee popliteal bypass 6/22.      PT Comments    Pt admitted with above diagnosis. Pt currently with functional limitations due to the deficits listed below (see PT Problem List). Pt was able to ambulate with min guard assist with incr HR and pain limiting pts progression. HR as high as 143 bpm.   Will continue to progress as able  Pt will benefit from skilled PT to increase their independence and safety with mobility to allow discharge to the venue listed below.     Follow Up Recommendations  SNF;Supervision/Assistance - 24 hour     Equipment Recommendations  None recommended by PT    Recommendations for Other Services OT consult     Precautions / Restrictions Precautions Precautions: Fall Precaution Comments: Edema at surgical sites on BLEs Restrictions Weight Bearing Restrictions: No    Mobility  Bed Mobility Overal bed mobility: Needs Assistance Bed Mobility: Supine to Sit;Sit to Supine Rolling: Min guard   Supine to sit: Supervision;HOB elevated Sit to supine: Supervision;HOB elevated   General bed mobility comments: supervision for safety  Transfers Overall transfer level: Needs assistance Equipment used: Rolling walker (2 wheeled) Transfers: Sit to/from [employer] Transfers Sit to Stand: From elevated surface;Min guard         General transfer comment: Min Guard A for stability and safety  Ambulation/Gait Ambulation/Gait assistance: Counsellor (Feet): 65 Feet Assistive device: Rolling walker (2 wheeled) Gait Pattern/deviations: Decreased stance time - right;Decreased stride  length;Trunk flexed;Step-to pattern;Decreased weight shift to right Gait velocity: decr Gait velocity interpretation: <1.31 ft/sec, indicative of household ambulator General Gait Details: limited distance due to pain and incr HR up to 143 bpm from109 bpm. Good safety with RW just difficult to progress.  Also pt with muscle cramps.     Stairs             Wheelchair Mobility    Modified Rankin (Stroke Patients Only)       Balance Overall balance assessment: Needs assistance Sitting-balance support: No upper extremity supported;Feet supported Sitting balance-Leahy Scale: Fair     Standing balance support: Bilateral upper extremity supported;During functional activity Standing balance-Leahy Scale: Poor Standing balance comment: relies on UE support or physical A                            Cognition Arousal/Alertness: Awake/alert Behavior During Therapy: WFL for tasks assessed/performed Overall Cognitive Status: Within Functional Limits for tasks assessed                                        Exercises General Exercises - Lower Extremity Ankle Circles/Pumps: AROM;Both;10 reps Long Arc Quad: AROM;Both;5 reps;Seated Other Exercises Other Exercises: Gave pt a sheet and asked him to use it for calf stretch and hamstring stretch.  Pt performed 3 x each LE    General Comments General comments (skin integrity, edema, etc.): HR elevating to 143 during toileting      Pertinent Vitals/Pain Pain Assessment: Faces Faces Pain Scale: Hurts whole lot Pain  Location: Left thigh at surgical site Pain Descriptors / Indicators: Grimacing;Throbbing;Cramping Pain Intervention(s): Limited activity within patient's tolerance;Monitored during session;Premedicated before session;Repositioned    Home Living                      Prior Function            PT Goals (current goals can now be found in the care plan section) Acute Rehab PT Goals Patient  Stated Goal: have pain under control before return home Progress towards PT goals: Progressing toward goals    Frequency    Min 3X/week      PT Plan Current plan remains appropriate    Co-evaluation              AM-PAC PT "6 Clicks" Daily Activity  Outcome Measure  Difficulty turning over in bed (including adjusting bedclothes, sheets and blankets)?: None Difficulty moving from lying on back to sitting on the side of the bed? : None Difficulty sitting down on and standing up from a chair with arms (e.g., wheelchair, bedside commode, etc,.)?: A Little Help needed moving to and from a bed to chair (including a wheelchair)?: A Little Help needed walking in hospital room?: A Little Help needed climbing 3-5 steps with a railing? : A Lot 6 Click Score: 19    End of Session Equipment Utilized During Treatment: Gait belt Activity Tolerance: Patient limited by pain;Patient limited by fatigue Patient left: with call bell/phone within reach;in bed Nurse Communication: Mobility status PT Visit Diagnosis: Other abnormalities of gait and mobility (R26.89);Pain Pain - Right/Left: Right Pain - part of body: Ankle and joints of foot     Time: 9937-1696 PT Time Calculation (min) (ACUTE ONLY): 24 min  Charges:  $Gait Training: 8-22 mins                    G Codes:       [name],PT Acute Rehabilitation [phone] (pager)    [name] 12/05/2017, 1:23 PM

## 2017-12-05 NOTE — Discharge Summary (Signed)
Physician Discharge Summary   Patient ID: Tony [name] [id number] 52 y.o. May 26, 1966  Admit date: 12/01/2017  Discharge date and time: 12/05/17   Admitting Physician: Rosetta Posner, MD   Discharge Physician: Dr. Scot Dock  Admission Diagnoses: Ischemic pain of left foot [N56.213, I99.9]  Discharge Diagnoses: same  Admission Condition: fair  Discharged Condition: fair  Indication for Admission: Critical limb ischemia of left leg  Hospital Course: Tony [name] is a 52 year old male who presents to the emergency department with critical left lower extremity limb ischemia on 12/01/2017.  He is status post aortobifemoral bypass and right femoral to popliteal bypass by Dr. Scot Dock on 11/15/2017.  Work-up in the emergency department included a CTA which demonstrated acute thrombosis of left limb of ABF bypass.  He was taken emergently to the operating room by Dr. early on 12/01/2017 and underwent thrombectomy of the left limb aVF bypass with left femoral to above-the-knee popliteal artery bypass with vein and tibial thrombectomy.  He was admitted to the hospital as an inpatient for pain control, titration of mobility, and evaluation of circulatory status.  Case management was also consulted to evaluate patient's insurance to see if anticoagulation would be affordable to patient now that he has thrombosed the left limb of his aortobifemoral bypass.  Physical therapy and Occupational Therapy also consulted to evaluate patient and recommended skilled nursing facility placement.  Over the next several days patient's mobility increased, he is tolerating home diet, and feeling fit for discharge to skilled nursing facility.  The toes of his right foot are demarcating well however no surgical intervention is indicated at this time.  He will be discharged with a starter pack of Xarelto.  He will also be prescribed 2 to 3 days of narcotic pain medication for continued postoperative pain control.  He will follow-up  in office in a couple weeks to see Dr. Scot Dock.  Discharge instructions were reviewed with the patient and he voices his understanding.  He will be discharged to skilled nursing facility today in stable condition.  Consults: None  Treatments: surgery: Thrombectomy left limb of aorto bifemoral bypass with left femoral to above-the-knee popliteal artery bypass with vein and tibial thrombectomy by Dr. early on 12/01/2017  Discharge Exam: see progress note 12/05/17 Vitals:   12/05/17 0822 12/05/17 1356  BP: (!) 145/81 (!) 152/83  Pulse: 87 (!) 109  Resp: 18 18  Temp: 99.1 F (37.3 C) 99.4 F (37.4 C)  SpO2: 99% 98%     Disposition: Discharge disposition: 03-Skilled [place] use ---  Post-op:  Wound infection: No  Graft infection: No  Transfusion: No  If yes,  units given New Arrhythmia: No Ipsilateral amputation: [x ] no, [ ]  Minor, [ ]  BKA, [ ]  AKA Discharge patency: [ ]  Primary, [ ]  Primary assisted, [ ]  Secondary, [ ]  Occluded Patency judged by: [ ]  Dopper only, [ ]  Palpable graft pulse, [x ] Palpable distal pulse, [ ]  ABI inc. >  D/C Ambulatory Status: Ambulatory  Complications: MI: [ x] No, [ ]  Troponin only, [ ]  EKG or Clinical CHF: No Resp failure: [x ] none, [ ]  Pneumonia, [ ]  Ventilator Chg in renal function: [x ] none, [ ]  Inc. Cr > 0.5, [ ]  Temp. Dialysis, [ ]  Permanent dialysis Stroke: [x ] None, [ ]  Minor, [ ]  Major Return to OR: No  Reason for return to OR: [ ]  Bleeding, [ ]  Infection, [ ]   Thrombosis, [ ]  Revision  Discharge medications: Statin use:  Yes ASA use:  Yes Plavix use:  No  for medical reason full anticoagulation Beta blocker use: Yes Coumadin use: No  for medical reason Xarelto    Patient Instructions:  Allergies as of 12/05/2017      Reactions   Pork-derived Products    UNSPECIFIED REASON Pt doesn't eat pork   Morphine And Related Other (See Comments)   hallucinations      Medication List    TAKE  these medications   atorvastatin 40 MG tablet Commonly known as:  LIPITOR Take 1 tablet (40 mg total) by mouth daily.   gabapentin 100 MG capsule Commonly known as:  NEURONTIN Take 2 capsules (200 mg total) by mouth 2 (two) times daily.   gabapentin 300 MG capsule Commonly known as:  NEURONTIN Take 1 capsule (300 mg total) by mouth 3 (three) times daily.   hydrochlorothiazide 25 MG tablet Commonly known as:  HYDRODIURIL Take 25 mg by mouth daily.   LORazepam 1 MG tablet Commonly known as:  ATIVAN Take 1 mg by mouth every 8 (eight) hours as needed for anxiety.   meloxicam 15 MG tablet Commonly known as:  MOBIC Take 15 mg by mouth daily as needed for pain.   metoprolol succinate 100 MG 24 hr tablet Commonly known as:  TOPROL-XL Take 100 mg by mouth at bedtime. Take with or immediately following a meal.   oxyCODONE-acetaminophen 5-325 MG tablet Commonly known as:  PERCOCET/ROXICET Take 1-2 tablets by mouth every 6 (six) hours as needed for severe pain. What changed:  Another medication with the same name was removed. Continue taking this medication, and follow the directions you see here.   Rivaroxaban 15 MG Tabs tablet Commonly known as:  XARELTO Take 1 tablet (15 mg total) by mouth 2 (two) times daily with a meal.   rivaroxaban 20 MG Tabs tablet Commonly known as:  XARELTO Take 1 tablet (20 mg total) by mouth daily with supper. Start taking on:  12/24/2017   valsartan 160 MG tablet Commonly known as:  DIOVAN Take 160 mg by mouth daily.      Activity: activity as tolerated Diet: regular diet Wound Care: keep wound clean and dry  Follow-up with Dr. Scot Dock in 2 weeks.  SignedDagoberto Ligas 12/05/2017 3:43 PM

## 2017-12-05 NOTE — Clinical Social Work Note (Signed)
CSW facilitated patient discharge including contacting patient family and facility to confirm patient discharge plans. Clinical information faxed to facility and family agreeable with plan. CSW arranged ambulance transport via [facility] to [facility]. RN to call report prior to discharge ([phone]).  CSW will sign off for now as social work intervention is no longer needed. Please consult us again if new needs arise.  [name], CSW [phone]   

## 2017-12-05 NOTE — Progress Notes (Signed)
Occupational Therapy Treatment Patient Details Name: Tony [name] MRN: [id number] DOB: 09-03-1965 Today's Date: 12/05/2017    History of present illness 52 y.o. male PMH significant of R aortobifemoral bypass anastomosis on 11-15-17, HTN, IBS, and GERD Presenting to ED with pain, coldness, and numbness of left foot. s/p aortofemoral bypass, left femoral to above-knee popliteal bypass 6/22.     OT comments  Pt progressing towards established OT goals. Continue to demonstrate decreased functional performance compared to baseline and is limited by pain at BLEs. Pt performing grooming, toileting, and functional mobility with Min Guard A-Min A and RW for standing balance and safety. Pt HR elevating to 143 during toileting; RN notified. Continue to recommend post-acute rehab and will continue to follow acutely as admitted.    Follow Up Recommendations  SNF    Equipment Recommendations  None recommended by OT    Recommendations for Other Services PT consult    Precautions / Restrictions Precautions Precautions: Fall Precaution Comments: Edema at surgical sites on BLEs Restrictions Weight Bearing Restrictions: No       Mobility Bed Mobility Overal bed mobility: Needs Assistance Bed Mobility: Supine to Sit;Sit to Supine     Supine to sit: Supervision;HOB elevated Sit to supine: Supervision;HOB elevated   General bed mobility comments: supervision for safety  Transfers Overall transfer level: Needs assistance Equipment used: Rolling walker (2 wheeled) Transfers: Sit to/from [employer] Transfers Sit to Stand: From elevated surface;Min guard         General transfer comment: Min Guard A for stability and safety    Balance Overall balance assessment: Needs assistance Sitting-balance support: No upper extremity supported;Feet supported Sitting balance-Leahy Scale: Fair     Standing balance support: Bilateral upper extremity supported;During functional  activity Standing balance-Leahy Scale: Poor Standing balance comment: relies on UE support or physical A                           ADL either performed or assessed with clinical judgement   ADL Overall ADL's : Needs assistance/impaired     Grooming: Oral care;Min guard;Minimal assistance;Standing Grooming Details (indicate cue type and reason): Min Guard-Min A for standing balance during oral care at sink. Focus on posture and standing balance.              Lower Body Dressing: Min guard;Sit to/from stand Lower Body Dressing Details (indicate cue type and reason): Pt donning socks at EOB. Min Guard A for safety in standing       Toileting - Clothing Manipulation Details (indicate cue type and reason): Min Guard A for safety during urination. Providing education on safe RW use for toileting.     Functional mobility during ADLs: Minimal assistance;Rolling walker;Min guard General ADL Comments: Pt continues to be limited by pain. Pt motivated to participate in therarpy and performing ADLs and funcitonal mobility. Pt requiring Min Guard-Min A throughout for safety and standing balance. Pt HR elevating to 140s during toileting     Vision       Perception     Praxis      Cognition Arousal/Alertness: Awake/alert Behavior During Therapy: WFL for tasks assessed/performed Overall Cognitive Status: Within Functional Limits for tasks assessed                                          Exercises  Shoulder Instructions       General Comments HR elevating to 143 during toileting    Pertinent Vitals/ Pain       Pain Assessment: Faces Faces Pain Scale: Hurts whole lot Pain Location: Left thigh at surgical site Pain Descriptors / Indicators: Grimacing;Throbbing;Cramping Pain Intervention(s): Monitored during session;Limited activity within patient's tolerance;Repositioned  Home Living                                           Prior Functioning/Environment              Frequency  Min 3X/week        Progress Toward Goals  OT Goals(current goals can now be found in the care plan section)     Acute Rehab OT Goals Patient Stated Goal: have pain under control before return home OT Goal Formulation: With patient Time For Goal Achievement: 12/16/17 Potential to Achieve Goals: Good ADL Goals Pt Will Perform Grooming: with modified independence;standing Pt Will Perform Upper Body Bathing: with set-up;sitting Pt Will Perform Upper Body Dressing: with modified independence;sitting Pt Will Perform Lower Body Dressing: with modified independence;sit to/from stand Pt Will Transfer to Toilet: with modified independence;ambulating;bedside commode Pt Will Perform Toileting - Clothing Manipulation and hygiene: with modified independence;sit to/from stand Pt Will Perform Tub/Shower Transfer: Tub transfer;3 in 1;ambulating;rolling walker;with min guard assist Additional ADL Goal #1: pt will complete bed mobility mod i as precursor to adls  Plan Discharge plan remains appropriate    Co-evaluation    PT/OT/SLP Co-Evaluation/Treatment: (Dove tail for pain management)            AM-PAC PT "6 Clicks" Daily Activity     Outcome Measure   Help from another person eating meals?: None Help from another person taking care of personal grooming?: None Help from another person toileting, which includes using toliet, bedpan, or urinal?: None Help from another person bathing (including washing, rinsing, drying)?: A Little Help from another person to put on and taking off regular upper body clothing?: A Little Help from another person to put on and taking off regular lower body clothing?: A Little 6 Click Score: 21    End of Session Equipment Utilized During Treatment: Rolling walker  OT Visit Diagnosis: Unsteadiness on feet (R26.81);Muscle weakness (generalized) (M62.81)   Activity Tolerance Patient tolerated  treatment well;Patient limited by pain   Patient Left with call bell/phone within reach;in bed(with PT)   Nurse Communication Mobility status;Precautions;Patient requests pain meds        Time: 1224-1233 OT Time Calculation (min): 9 min  Charges: OT General Charges $OT Visit: 1 Visit OT Treatments $Self Care/Home Management : 8-22 mins  [place], OTR/L Acute Rehab Pager: [phone] Office: [place] 12/05/2017, 1:10 PM

## 2017-12-05 NOTE — Progress Notes (Signed)
   VASCULAR SURGERY ASSESSMENT & PLAN:   Both feet are warm and well-perfused.  The toes on the right foot are demarcating.  He should be ready for discharge to the skilled nursing facility once a bed is available.    He has a lymphocele in his above-the-knee incision on the right side.  I can aspirate this in the office if this does not improve.  Continue Xarelto.  SUBJECTIVE:   Complains of some pain in the toes of his right foot.  PHYSICAL EXAM:   Vitals:   12/04/17 2023 12/04/17 2100 12/05/17 0010 12/05/17 0345  BP: 139/87  140/77 132/82  Pulse: 100 (!) 106 (!) 102 93  Resp: (!) 25  19 (!) 23  Temp: 98.9 F (37.2 C)  98.3 F (36.8 C) 98.6 F (37 C)  TempSrc: Oral  Oral Oral  SpO2: 97%  98% 97%  Weight:      Height:       His groin incisions look fine. Both feet are warm. Lymphocele right above-the-knee incision is stable.  LABS:   Lab Results  Component Value Date   WBC 7.6 12/05/2017   HGB 7.9 (L) 12/05/2017   HCT 25.3 (L) 12/05/2017   MCV 94.8 12/05/2017   PLT 378 12/05/2017   Lab Results  Component Value Date   CREATININE 1.00 12/05/2017   PROBLEM LIST:    Active Problems:   PVD (peripheral vascular disease) (HCC)  CURRENT MEDS:   . atorvastatin  40 mg Oral q1800  . docusate sodium  100 mg Oral Daily  . gabapentin  200 mg Oral BID  . hydrochlorothiazide  25 mg Oral Daily  . irbesartan  37.5 mg Oral Daily  . metoprolol succinate  100 mg Oral QHS  . pantoprazole  40 mg Oral Daily  . Rivaroxaban  15 mg Oral BID WC  . [START ON 12/24/2017] rivaroxaban  20 mg Oral Q supper   Deitra Mayo Beeper: [phone] Office: [phone] 12/05/2017

## 2017-12-05 NOTE — Clinical Social Work Placement (Signed)
   CLINICAL SOCIAL WORK PLACEMENT  NOTE  Date:  12/05/2017  Patient Details  Name: Tony [name] MRN: [id number] Date of Birth: [birth year]  Clinical Social Work is seeking post-discharge placement for this patient at the [place] level of care (*CSW will initial, date and re-position this form in  chart as items are completed):  Yes   Patient/family provided with [place] Work Department's list of facilities offering this level of care within the geographic area requested by the patient (or if unable, by the patient's family).  Yes   Patient/family informed of their freedom to choose among providers that offer the needed level of care, that participate in Medicare, Medicaid or managed care program needed by the patient, have an available bed and are willing to accept the patient.  Yes   Patient/family informed of [place]'s ownership interest in [facility] and [facility], as well as of the fact that they are under no obligation to receive care at these facilities.  PASRR submitted to EDS on       PASRR number received on       Existing PASRR number confirmed on 12/05/17     FL2 transmitted to all facilities in geographic area requested by pt/family on 12/05/17     FL2 transmitted to all facilities within larger geographic area on       Patient informed that his/her managed care company has contracts with or will negotiate with certain facilities, including the following:            Patient/family informed of bed offers received.  Patient chooses bed at [facility]     Physician recommends and patient chooses bed at      Patient to be transferred to [facility] on 12/05/17.  Patient to be transferred to facility by       Patient family notified on 12/05/17 of transfer.  Name of family member notified:  patients  spouse is at bedside      PHYSICIAN       Additional Comment:     _______________________________________________ Candie Chroman, LCSW 12/05/2017, 4:16 PM

## 2017-12-05 NOTE — Progress Notes (Signed)
Attempted report a third time to [facility] with no success.

## 2017-12-05 NOTE — Progress Notes (Signed)
Attempted report x2 to [facility].

## 2017-12-17 ENCOUNTER — Inpatient Hospital Stay (HOSPITAL_COMMUNITY)
Admission: EM | Admit: 2017-12-17 | Discharge: 2018-01-23 | DRG: 856 | Disposition: A | Discharge status: Hospice | Payer: BLUE CROSS/BLUE SHIELD | Attending: Family Medicine | Admitting: Family Medicine

## 2017-12-17 ENCOUNTER — Other Ambulatory Visit: Payer: Self-pay

## 2017-12-17 ENCOUNTER — Encounter (HOSPITAL_COMMUNITY): Payer: Self-pay | Admitting: Emergency Medicine

## 2017-12-17 ENCOUNTER — Inpatient Hospital Stay (HOSPITAL_COMMUNITY): Payer: BLUE CROSS/BLUE SHIELD

## 2017-12-17 DIAGNOSIS — R918 Other nonspecific abnormal finding of lung field: Secondary | ICD-10-CM

## 2017-12-17 DIAGNOSIS — R630 Anorexia: Secondary | ICD-10-CM | POA: Diagnosis present

## 2017-12-17 DIAGNOSIS — C3411 Malignant neoplasm of upper lobe, right bronchus or lung: Secondary | ICD-10-CM | POA: Diagnosis present

## 2017-12-17 DIAGNOSIS — D473 Essential (hemorrhagic) thrombocythemia: Secondary | ICD-10-CM | POA: Diagnosis not present

## 2017-12-17 DIAGNOSIS — G8918 Other acute postprocedural pain: Secondary | ICD-10-CM

## 2017-12-17 DIAGNOSIS — Z79899 Other long term (current) drug therapy: Secondary | ICD-10-CM

## 2017-12-17 DIAGNOSIS — B3781 Candidal esophagitis: Secondary | ICD-10-CM | POA: Diagnosis not present

## 2017-12-17 DIAGNOSIS — T8149XA Infection following a procedure, other surgical site, initial encounter: Secondary | ICD-10-CM | POA: Diagnosis not present

## 2017-12-17 DIAGNOSIS — F064 Anxiety disorder due to known physiological condition: Secondary | ICD-10-CM | POA: Diagnosis present

## 2017-12-17 DIAGNOSIS — I10 Essential (primary) hypertension: Secondary | ICD-10-CM | POA: Diagnosis present

## 2017-12-17 DIAGNOSIS — Z885 Allergy status to narcotic agent status: Secondary | ICD-10-CM

## 2017-12-17 DIAGNOSIS — K5900 Constipation, unspecified: Secondary | ICD-10-CM | POA: Diagnosis not present

## 2017-12-17 DIAGNOSIS — T8141XA Infection following a procedure, superficial incisional surgical site, initial encounter: Secondary | ICD-10-CM | POA: Diagnosis not present

## 2017-12-17 DIAGNOSIS — R221 Localized swelling, mass and lump, neck: Secondary | ICD-10-CM | POA: Diagnosis not present

## 2017-12-17 DIAGNOSIS — M545 Low back pain: Secondary | ICD-10-CM | POA: Diagnosis present

## 2017-12-17 DIAGNOSIS — C801 Malignant (primary) neoplasm, unspecified: Secondary | ICD-10-CM | POA: Diagnosis not present

## 2017-12-17 DIAGNOSIS — R634 Abnormal weight loss: Secondary | ICD-10-CM | POA: Insufficient documentation

## 2017-12-17 DIAGNOSIS — I82C11 Acute embolism and thrombosis of right internal jugular vein: Secondary | ICD-10-CM | POA: Diagnosis not present

## 2017-12-17 DIAGNOSIS — I82B11 Acute embolism and thrombosis of right subclavian vein: Secondary | ICD-10-CM | POA: Diagnosis present

## 2017-12-17 DIAGNOSIS — K219 Gastro-esophageal reflux disease without esophagitis: Secondary | ICD-10-CM | POA: Diagnosis present

## 2017-12-17 DIAGNOSIS — N179 Acute kidney failure, unspecified: Secondary | ICD-10-CM | POA: Diagnosis not present

## 2017-12-17 DIAGNOSIS — E43 Unspecified severe protein-calorie malnutrition: Secondary | ICD-10-CM | POA: Diagnosis not present

## 2017-12-17 DIAGNOSIS — Y842 Radiological procedure and radiotherapy as the cause of abnormal reaction of the patient, or of later complication, without mention of misadventure at the time of the procedure: Secondary | ICD-10-CM | POA: Diagnosis not present

## 2017-12-17 DIAGNOSIS — I82621 Acute embolism and thrombosis of deep veins of right upper extremity: Secondary | ICD-10-CM | POA: Diagnosis not present

## 2017-12-17 DIAGNOSIS — Z681 Body mass index (BMI) 19 or less, adult: Secondary | ICD-10-CM | POA: Diagnosis not present

## 2017-12-17 DIAGNOSIS — I82409 Acute embolism and thrombosis of unspecified deep veins of unspecified lower extremity: Secondary | ICD-10-CM | POA: Diagnosis present

## 2017-12-17 DIAGNOSIS — B370 Candidal stomatitis: Secondary | ICD-10-CM | POA: Diagnosis not present

## 2017-12-17 DIAGNOSIS — Z791 Long term (current) use of non-steroidal anti-inflammatories (NSAID): Secondary | ICD-10-CM

## 2017-12-17 DIAGNOSIS — Z87891 Personal history of nicotine dependence: Secondary | ICD-10-CM

## 2017-12-17 DIAGNOSIS — F419 Anxiety disorder, unspecified: Secondary | ICD-10-CM | POA: Diagnosis not present

## 2017-12-17 DIAGNOSIS — Z7189 Other specified counseling: Secondary | ICD-10-CM

## 2017-12-17 DIAGNOSIS — I871 Compression of vein: Secondary | ICD-10-CM | POA: Diagnosis not present

## 2017-12-17 DIAGNOSIS — R52 Pain, unspecified: Secondary | ICD-10-CM | POA: Diagnosis present

## 2017-12-17 DIAGNOSIS — Z95828 Presence of other vascular implants and grafts: Secondary | ICD-10-CM

## 2017-12-17 DIAGNOSIS — M542 Cervicalgia: Secondary | ICD-10-CM | POA: Diagnosis present

## 2017-12-17 DIAGNOSIS — R590 Localized enlarged lymph nodes: Secondary | ICD-10-CM | POA: Diagnosis not present

## 2017-12-17 DIAGNOSIS — D630 Anemia in neoplastic disease: Secondary | ICD-10-CM | POA: Diagnosis present

## 2017-12-17 DIAGNOSIS — F319 Bipolar disorder, unspecified: Secondary | ICD-10-CM | POA: Diagnosis present

## 2017-12-17 DIAGNOSIS — D649 Anemia, unspecified: Secondary | ICD-10-CM | POA: Diagnosis not present

## 2017-12-17 DIAGNOSIS — E7800 Pure hypercholesterolemia, unspecified: Secondary | ICD-10-CM | POA: Diagnosis present

## 2017-12-17 DIAGNOSIS — Z515 Encounter for palliative care: Secondary | ICD-10-CM | POA: Diagnosis not present

## 2017-12-17 DIAGNOSIS — I96 Gangrene, not elsewhere classified: Secondary | ICD-10-CM | POA: Diagnosis present

## 2017-12-17 DIAGNOSIS — Z7901 Long term (current) use of anticoagulants: Secondary | ICD-10-CM | POA: Diagnosis not present

## 2017-12-17 DIAGNOSIS — R64 Cachexia: Secondary | ICD-10-CM | POA: Diagnosis not present

## 2017-12-17 DIAGNOSIS — K625 Hemorrhage of anus and rectum: Secondary | ICD-10-CM | POA: Diagnosis not present

## 2017-12-17 DIAGNOSIS — L598 Other specified disorders of the skin and subcutaneous tissue related to radiation: Secondary | ICD-10-CM | POA: Diagnosis not present

## 2017-12-17 DIAGNOSIS — G8929 Other chronic pain: Secondary | ICD-10-CM | POA: Diagnosis present

## 2017-12-17 DIAGNOSIS — Z888 Allergy status to other drugs, medicaments and biological substances status: Secondary | ICD-10-CM

## 2017-12-17 DIAGNOSIS — D6859 Other primary thrombophilia: Secondary | ICD-10-CM | POA: Diagnosis not present

## 2017-12-17 DIAGNOSIS — I82401 Acute embolism and thrombosis of unspecified deep veins of right lower extremity: Secondary | ICD-10-CM | POA: Diagnosis not present

## 2017-12-17 DIAGNOSIS — Z6822 Body mass index (BMI) 22.0-22.9, adult: Secondary | ICD-10-CM | POA: Diagnosis not present

## 2017-12-17 DIAGNOSIS — G893 Neoplasm related pain (acute) (chronic): Secondary | ICD-10-CM | POA: Diagnosis present

## 2017-12-17 DIAGNOSIS — I739 Peripheral vascular disease, unspecified: Secondary | ICD-10-CM | POA: Diagnosis present

## 2017-12-17 DIAGNOSIS — L02214 Cutaneous abscess of groin: Secondary | ICD-10-CM | POA: Diagnosis not present

## 2017-12-17 DIAGNOSIS — E860 Dehydration: Secondary | ICD-10-CM | POA: Diagnosis present

## 2017-12-17 DIAGNOSIS — L03313 Cellulitis of chest wall: Secondary | ICD-10-CM | POA: Diagnosis not present

## 2017-12-17 LAB — COMPREHENSIVE METABOLIC PANEL
ALBUMIN: 3.1 g/dL — AB (ref 3.5–5.0)
ALT: 20 U/L (ref 0–44)
AST: 21 U/L (ref 15–41)
Alkaline Phosphatase: 127 U/L — ABNORMAL HIGH (ref 38–126)
Anion gap: 8 (ref 5–15)
BUN: 13 mg/dL (ref 6–20)
CHLORIDE: 101 mmol/L (ref 98–111)
CO2: 27 mmol/L (ref 22–32)
CREATININE: 0.98 mg/dL (ref 0.61–1.24)
Calcium: 9.4 mg/dL (ref 8.9–10.3)
GFR calc Af Amer: >60 mL/min (ref >60)
GFR calc non Af Amer: >60 mL/min (ref >60)
GLUCOSE: 112 mg/dL — AB (ref 70–99)
POTASSIUM: 4 mmol/L (ref 3.5–5.1)
Sodium: 136 mmol/L (ref 135–145)
Total Bilirubin: 0.6 mg/dL (ref 0.3–1.2)
Total Protein: 7.2 g/dL (ref 6.5–8.1)

## 2017-12-17 LAB — CBC WITH DIFFERENTIAL/PLATELET
ABS IMMATURE GRANULOCYTES: 0 10*3/uL (ref 0.0–0.1)
Basophils Absolute: 0.1 10*3/uL (ref 0.0–0.1)
Basophils Relative: 1 %
EOS ABS: 0.2 10*3/uL (ref 0.0–0.7)
Eosinophils Relative: 3 %
HEMATOCRIT: 29.2 % — AB (ref 39.0–52.0)
Hemoglobin: 9 g/dL — ABNORMAL LOW (ref 13.0–17.0)
IMMATURE GRANULOCYTES: 0 %
LYMPHS ABS: 0.8 10*3/uL (ref 0.7–4.0)
Lymphocytes Relative: 12 %
MCH: 28.4 pg (ref 26.0–34.0)
MCHC: 30.8 g/dL (ref 30.0–36.0)
MCV: 92.1 fL (ref 78.0–100.0)
Monocytes Absolute: 0.6 10*3/uL (ref 0.1–1.0)
Monocytes Relative: 8 %
NEUTROS PCT: 76 %
Neutro Abs: 5.2 10*3/uL (ref 1.7–7.7)
PLATELETS: 404 10*3/uL — AB (ref 150–400)
RBC: 3.17 MIL/uL — AB (ref 4.22–5.81)
RDW: 12.8 % (ref 11.5–15.5)
WBC: 6.9 10*3/uL (ref 4.0–10.5)

## 2017-12-17 LAB — URINALYSIS, ROUTINE W REFLEX MICROSCOPIC
BILIRUBIN URINE: NEGATIVE
Glucose, UA: NEGATIVE mg/dL
HGB URINE DIPSTICK: NEGATIVE
KETONES UR: 20 mg/dL — AB
Leukocytes, UA: NEGATIVE
NITRITE: NEGATIVE
Protein, ur: NEGATIVE mg/dL
pH: 6 (ref 5.0–8.0)

## 2017-12-17 LAB — APTT
APTT: 65 s — AB (ref 24–36)
aPTT: 38 seconds — ABNORMAL HIGH (ref 24–36)
aPTT: 37 seconds — ABNORMAL HIGH (ref 24–36)

## 2017-12-17 LAB — GLUCOSE, CAPILLARY: Glucose-Capillary: 90 mg/dL (ref 70–99)

## 2017-12-17 LAB — PROTIME-INR
INR: 2.07
Prothrombin Time: 23.1 seconds — ABNORMAL HIGH (ref 11.4–15.2)

## 2017-12-17 LAB — HEPARIN LEVEL (UNFRACTIONATED): Heparin Unfractionated: >2.2 IU/mL — ABNORMAL HIGH (ref 0.30–0.70)

## 2017-12-17 LAB — I-STAT CG4 LACTIC ACID, ED: LACTIC ACID, VENOUS: 0.84 mmol/L (ref 0.5–1.9)

## 2017-12-17 MED ORDER — LABETALOL HCL 5 MG/ML IV SOLN
10.0000 mg | INTRAVENOUS | Status: DC | PRN
  Administered 2017-12-25 – 2018-01-07 (×2): 10 mg via INTRAVENOUS
  Filled 2017-12-17 (×4): qty 4

## 2017-12-17 MED ORDER — PANTOPRAZOLE SODIUM 40 MG PO TBEC
40.0000 mg | DELAYED_RELEASE_TABLET | Freq: Every day | ORAL | Status: DC
  Administered 2017-12-17 – 2018-01-23 (×38): 40 mg via ORAL
  Filled 2017-12-17 (×38): qty 1

## 2017-12-17 MED ORDER — IOHEXOL 300 MG/ML  SOLN
100.0000 mL | Freq: Once | INTRAMUSCULAR | Status: AC | PRN
  Administered 2017-12-17: 100 mL via INTRAVENOUS

## 2017-12-17 MED ORDER — LORAZEPAM 1 MG PO TABS
1.0000 mg | ORAL_TABLET | Freq: Three times a day (TID) | ORAL | Status: DC | PRN
  Administered 2017-12-17 – 2018-01-07 (×11): 1 mg via ORAL
  Filled 2017-12-17 (×12): qty 1

## 2017-12-17 MED ORDER — SODIUM CHLORIDE 0.9% FLUSH
3.0000 mL | INTRAVENOUS | Status: DC | PRN
  Administered 2018-01-04: 3 mL via INTRAVENOUS
  Filled 2017-12-17: qty 3

## 2017-12-17 MED ORDER — HEPARIN SODIUM (PORCINE) 5000 UNIT/ML IJ SOLN
5000.0000 [IU] | Freq: Three times a day (TID) | INTRAMUSCULAR | Status: DC
Start: 2017-12-18 — End: 2017-12-17
  Filled 2017-12-17: qty 1

## 2017-12-17 MED ORDER — HYDROMORPHONE HCL 1 MG/ML IJ SOLN
0.5000 mg | INTRAMUSCULAR | Status: DC | PRN
  Administered 2017-12-17 – 2017-12-22 (×20): 0.5 mg via INTRAVENOUS
  Filled 2017-12-17 (×22): qty 1

## 2017-12-17 MED ORDER — POLYETHYLENE GLYCOL 3350 17 G PO PACK
17.0000 g | PACK | Freq: Every day | ORAL | Status: DC | PRN
  Administered 2017-12-22: 17 g via ORAL
  Filled 2017-12-17 (×2): qty 1

## 2017-12-17 MED ORDER — PHENOL 1.4 % MT LIQD
1.0000 | OROMUCOSAL | Status: DC | PRN
  Filled 2017-12-17: qty 177

## 2017-12-17 MED ORDER — ACETAMINOPHEN 325 MG RE SUPP: 325.0000 mg | RECTAL | Status: DC | PRN

## 2017-12-17 MED ORDER — ATORVASTATIN CALCIUM 40 MG PO TABS
40.0000 mg | ORAL_TABLET | Freq: Every day | ORAL | Status: DC
  Administered 2017-12-18 – 2018-01-23 (×37): 40 mg via ORAL
  Filled 2017-12-17 (×38): qty 1

## 2017-12-17 MED ORDER — SODIUM CHLORIDE 0.9 % IV SOLN
250.0000 mL | INTRAVENOUS | Status: DC | PRN
  Administered 2017-12-17 (×2): 250 mL via INTRAVENOUS

## 2017-12-17 MED ORDER — ONDANSETRON HCL 4 MG/2ML IJ SOLN
4.0000 mg | Freq: Four times a day (QID) | INTRAMUSCULAR | Status: DC | PRN
  Administered 2017-12-21 – 2017-12-22 (×2): 4 mg via INTRAVENOUS
  Filled 2017-12-17 (×2): qty 2

## 2017-12-17 MED ORDER — GABAPENTIN 100 MG PO CAPS
200.0000 mg | ORAL_CAPSULE | Freq: Two times a day (BID) | ORAL | Status: DC
  Administered 2017-12-17 – 2017-12-26 (×18): 200 mg via ORAL
  Filled 2017-12-17 (×20): qty 2

## 2017-12-17 MED ORDER — OXYCODONE-ACETAMINOPHEN 5-325 MG PO TABS
1.0000 | ORAL_TABLET | ORAL | Status: DC | PRN
  Administered 2017-12-17 – 2017-12-20 (×13): 2 via ORAL
  Filled 2017-12-17 (×14): qty 2

## 2017-12-17 MED ORDER — PIPERACILLIN-TAZOBACTAM 3.375 G IVPB
3.3750 g | Freq: Three times a day (TID) | INTRAVENOUS | Status: DC
Start: 2017-12-17 — End: 2017-12-20
  Administered 2017-12-18 – 2017-12-20 (×7): 3.375 g via INTRAVENOUS
  Filled 2017-12-17 (×9): qty 50

## 2017-12-17 MED ORDER — BISACODYL 10 MG RE SUPP
10.0000 mg | Freq: Every day | RECTAL | Status: DC | PRN
  Administered 2017-12-21: 10 mg via RECTAL
  Filled 2017-12-17: qty 1

## 2017-12-17 MED ORDER — HYDRALAZINE HCL 20 MG/ML IJ SOLN
5.0000 mg | INTRAMUSCULAR | Status: DC | PRN
  Administered 2017-12-25: 5 mg via INTRAVENOUS
  Filled 2017-12-17: qty 1

## 2017-12-17 MED ORDER — VANCOMYCIN HCL 10 G IV SOLR
1750.0000 mg | Freq: Once | INTRAVENOUS | Status: AC
  Administered 2017-12-17: 1750 mg via INTRAVENOUS
  Filled 2017-12-17: qty 1750

## 2017-12-17 MED ORDER — PIPERACILLIN-TAZOBACTAM 3.375 G IVPB 30 MIN
3.3750 g | Freq: Once | INTRAVENOUS | Status: AC
  Administered 2017-12-17: 3.375 g via INTRAVENOUS
  Filled 2017-12-17: qty 50

## 2017-12-17 MED ORDER — SODIUM CHLORIDE 0.9% FLUSH
3.0000 mL | Freq: Two times a day (BID) | INTRAVENOUS | Status: DC
  Administered 2017-12-17 – 2018-01-23 (×64): 3 mL via INTRAVENOUS

## 2017-12-17 MED ORDER — FENTANYL CITRATE (PF) 100 MCG/2ML IJ SOLN
50.0000 ug | Freq: Once | INTRAMUSCULAR | Status: AC
  Administered 2017-12-17: 50 ug via INTRAVENOUS
  Filled 2017-12-17: qty 2

## 2017-12-17 MED ORDER — ALUM & MAG HYDROXIDE-SIMETH 200-200-20 MG/5ML PO SUSP: 15.0000 mL | ORAL | Status: DC | PRN

## 2017-12-17 MED ORDER — ACETAMINOPHEN 325 MG PO TABS
325.0000 mg | ORAL_TABLET | ORAL | Status: DC | PRN
  Administered 2017-12-22 – 2017-12-26 (×2): 650 mg via ORAL
  Filled 2017-12-17 (×2): qty 2

## 2017-12-17 MED ORDER — METOPROLOL TARTRATE 5 MG/5ML IV SOLN: 2.0000 mg | INTRAVENOUS | Status: DC | PRN

## 2017-12-17 MED ORDER — VANCOMYCIN HCL IN DEXTROSE 1-5 GM/200ML-% IV SOLN
1000.0000 mg | Freq: Two times a day (BID) | INTRAVENOUS | Status: DC
  Administered 2017-12-18 – 2017-12-19 (×3): 1000 mg via INTRAVENOUS
  Filled 2017-12-17 (×4): qty 200

## 2017-12-17 MED ORDER — ARGATROBAN 50 MG/50ML IV SOLN
1.0000 ug/kg/min | INTRAVENOUS | Status: DC
  Administered 2017-12-17 – 2017-12-18 (×2): 1 ug/kg/min via INTRAVENOUS
  Filled 2017-12-17 (×3): qty 50

## 2017-12-17 MED ORDER — POTASSIUM CHLORIDE CRYS ER 20 MEQ PO TBCR
20.0000 meq | EXTENDED_RELEASE_TABLET | Freq: Once | ORAL | Status: AC
  Administered 2017-12-18: 20 meq via ORAL
  Filled 2017-12-17: qty 1

## 2017-12-17 MED ORDER — SODIUM CHLORIDE 0.9 % IV SOLN: 250.0000 mL | INTRAVENOUS | Status: DC | PRN

## 2017-12-17 MED ORDER — GUAIFENESIN-DM 100-10 MG/5ML PO SYRP: 15.0000 mL | ORAL_SOLUTION | ORAL | Status: DC | PRN

## 2017-12-17 NOTE — H&P (Addendum)
Hospital Consult    Reason for Consult:  Increased drainage from incisions Requesting Physician:  ER Junious Silk) MRN #:  [id number]  History of Present Illness: This is a 52 y.o. male who presents to the emergency department with critical left lower extremity limb ischemia on 12/01/2017.  He is status post aortobifemoral bypass and right femoral to popliteal bypass by Dr. Scot Dock on 11/15/2017.  Work-up in the emergency department included a CTA which demonstrated acute thrombosis of left limb of ABF bypass.  He was taken emergently to the operating room by Dr. Danny Zimny on 12/01/2017 and underwent thrombectomy of the left limb aVF bypass with left femoral to above-the-knee popliteal artery bypass with vein and tibial thrombectomy.   H was discharged to a SNF & on Xarelto.  He presents today from the SNF with drainage from his left groin and left below knee incision.    He states that his toes are feeling better but still painful.  He denies any fevers but has been having chills.      Past Medical History:  Diagnosis Date  . Anxiety   . Arterial occlusive disease    multilevel arterial occlusive disease/notes 11/13/2017  . Arthritis    "left knee" (11/13/2017)  . Chronic back pain   . Chronic lower back pain   . Depression   . Gastric ulcer due to Helicobacter pylori 1025  . GERD (gastroesophageal reflux disease)   . High cholesterol   . Hypertension   . IBS (irritable bowel syndrome)   . Mesenteric adenitis 2011   presumed/H&P  . Migraines    "use to suffer migraines years ago" (11/13/2017)  . Peripheral vascular disease ([place])   . Pneumonia 2011  . Ulcer     Past Surgical History:  Procedure Laterality Date  . AORTA - BILATERAL FEMORAL ARTERY BYPASS GRAFT Bilateral 11/15/2017   Procedure: AORTA BIFEMORAL BYPASS GRAFT;  Surgeon: Angelia Mould, MD;  Location: [place];  Service: Vascular;  Laterality: Bilateral;  . BACK SURGERY  X 4   "procedure where they burn the nerves every 6  months"  . EMBOLECTOMY Left 12/01/2017   Procedure: THROMBECTOMY OF LEFT AORTAFEMORAL BYPASS, THROMBECTOMY OF TIBIAL ARTERY;  Surgeon: Rosetta Posner, MD;  Location: [facility] OR;  Service: Vascular;  Laterality: Left;  . ESOPHAGOGASTRODUODENOSCOPY  08/22/2011   Procedure: ESOPHAGOGASTRODUODENOSCOPY (EGD);  Surgeon: [name], MD;  Location: [place];  Service: Gastroenterology;  Laterality: N/A;  . FEMORAL-POPLITEAL BYPASS GRAFT Right 11/15/2017   Procedure: Right FEMORAL-Above knee POPLITEAL ARTERY Bypass Graft using nonreversed saphenous vein ;  Surgeon: Angelia Mould, MD;  Location: [place];  Service: Vascular;  Laterality: Right;  . FEMORAL-POPLITEAL BYPASS GRAFT Left 12/01/2017   Procedure: BYPASS GRAFT FEMORAL- ABOVE KNEE POPLITEAL ARTERY WITH VEIN;  Surgeon: Rosetta Posner, MD;  Location: [place];  Service: Vascular;  Laterality: Left;  . INTRAOPERATIVE ARTERIOGRAM Right 11/15/2017   Procedure: INTRA OPERATIVE ARTERIOGRAM;  Surgeon: Angelia Mould, MD;  Location: [place];  Service: Vascular;  Laterality: Right;  . INTRAOPERATIVE ARTERIOGRAM Left 12/01/2017   Procedure: INTRA OPERATIVE ARTERIOGRAM OF LEFT LEG;  Surgeon: Rosetta Posner, MD;  Location: [place];  Service: Vascular;  Laterality: Left;  . KNEE ARTHROSCOPY Left X 5    Allergies  Allergen Reactions  . Pork-Derived Products     UNSPECIFIED REASON Pt doesn't eat pork  . Morphine And Related Other (See Comments)    hallucinations    Prior to Admission medications   Medication  Sig Start Date End Date Taking? Authorizing Provider  atorvastatin (LIPITOR) 40 MG tablet Take 1 tablet (40 mg total) by mouth daily. 11/26/17   Ulyses Amor, PA-C  gabapentin (NEURONTIN) 100 MG capsule Take 2 capsules (200 mg total) by mouth 2 (two) times daily. 11/11/17   Ulyses Amor, PA-C  gabapentin (NEURONTIN) 300 MG capsule Take 1 capsule (300 mg total) by mouth 3 (three) times daily. Patient not taking: Reported on 12/01/2017 11/26/17    Ulyses Amor, PA-C  hydrochlorothiazide (HYDRODIURIL) 25 MG tablet Take 25 mg by mouth daily.    [provider]  LORazepam (ATIVAN) 1 MG tablet Take 1 mg by mouth every 8 (eight) hours as needed for anxiety.    [provider]  meloxicam (MOBIC) 15 MG tablet Take 15 mg by mouth daily as needed for pain.    [provider]  metoprolol succinate (TOPROL-XL) 100 MG 24 hr tablet Take 100 mg by mouth at bedtime. Take with or immediately following a meal.    [provider]  oxyCODONE-acetaminophen (PERCOCET/ROXICET) 5-325 MG tablet Take 1-2 tablets by mouth every 6 (six) hours as needed for severe pain. 12/05/17   Dagoberto Ligas, PA-C  Rivaroxaban (XARELTO) 15 MG TABS tablet Take 1 tablet (15 mg total) by mouth 2 (two) times daily with a meal. 12/05/17   Dagoberto Ligas, PA-C  rivaroxaban (XARELTO) 20 MG TABS tablet Take 1 tablet (20 mg total) by mouth daily with supper. 12/24/17   Dagoberto Ligas, PA-C  valsartan (DIOVAN) 160 MG tablet Take 160 mg by mouth daily. 11/09/17   [provider]    Social History   Socioeconomic History  . Marital status: Married    Spouse name: Not on file  . Number of children: 3  . Years of education: Not on file  . Highest education level: Not on file  Occupational History  . Occupation: Admin. Assistant    Employer: [employer]  Social Needs  . Financial resource strain: Not on file  . Food insecurity:    Worry: Not on file    Inability: Not on file  . Transportation needs:    Medical: Not on file    Non-medical: Not on file  Tobacco Use  . Smoking status: Former Smoker    Packs/day: 1.00    Years: 24.00    Pack years: 24.00    Types: Cigarettes    Start date: 11/28/2017  . Smokeless tobacco: Never Used  Substance and Sexual Activity  . Alcohol use: Yes    Comment: 11/13/2017 "did drink wine q  1-2 months; nothing anymore"   . Drug use: Not Currently    Types: Marijuana    Comment: 11/13/2017  "last  drug use was in ~ 1987 "  . Sexual activity: Not on file  Lifestyle  . Physical activity:    Days per week: Not on file    Minutes per session: Not on file  . Stress: Not on file  Relationships  . Social connections:    Talks on phone: Not on file    Gets together: Not on file    Attends religious service: Not on file    Active member of club or organization: Not on file    Attends meetings of clubs or organizations: Not on file    Relationship status: Not on file  . Intimate partner violence:    Fear of current or ex partner: Not on file    Emotionally abused: Not on  file    Physically abused: Not on file    Forced sexual activity: Not on file  Other Topics Concern  . Not on file  Social History Narrative   Lives with wife and two children.  Administrator at [employer] History  Problem Relation Age of Onset  . Hypertension Mother   . Hypertension Father   . Coronary artery disease Father 64  . Heart attack Brother 33  . Coronary artery disease Brother   . Diabetes Neg Hx   . Stroke Neg Hx     ROS: [x]  Positive   [ ]  Negative   [ ]  All sytems reviewed and are negative See HPI  Physical Examination  Vitals:   12/17/17 1209 12/17/17 1215  BP:  (!) 122/51  Pulse:  72  Resp:  15  Temp: 99.3 F (37.4 C)   SpO2:  97%    General:  WDWN in NAD Gait: Not observed HENT: WNL, normocephalic Pulmonary: normal non-labored breathing Cardiac: regular Skin: right groin with some fluctuance; unable to express any fluid; right groin with mild erythema; no drainage expressed.  Bilateral groins tender to palpation. Vascular Exam/Pulses: Brisk PT doppler signals bilaterally Extremities:  Toes on the right foot continue to demarcate; swelling is improved on the right foot since I last saw him;  Swelling in right foot resolved.  Musculoskeletal: no muscle wasting or atrophy  Neurologic: A&O X 3;  No focal weakness or paresthesias are detected; speech is  fluent/normal Psychiatric:  The pt has Normal affect.   CBC    Component Value Date/Time   WBC 6.9 12/17/2017 1207   RBC 3.17 (L) 12/17/2017 1207   HGB 9.0 (L) 12/17/2017 1207   HCT 29.2 (L) 12/17/2017 1207   PLT 404 (H) 12/17/2017 1207   MCV 92.1 12/17/2017 1207   MCH 28.4 12/17/2017 1207   MCHC 30.8 12/17/2017 1207   RDW 12.8 12/17/2017 1207   LYMPHSABS 0.8 12/17/2017 1207   MONOABS 0.6 12/17/2017 1207   EOSABS 0.2 12/17/2017 1207   BASOSABS 0.1 12/17/2017 1207    BMET    Component Value Date/Time   NA 137 12/05/2017 0521   K 3.9 12/05/2017 0521   CL 99 12/05/2017 0521   CO2 30 12/05/2017 0521   GLUCOSE 118 (H) 12/05/2017 0521   BUN 7 12/05/2017 0521   CREATININE 1.00 12/05/2017 0521   CALCIUM 9.0 12/05/2017 0521   GFRNONAA >60 12/05/2017 0521   GFRAA >60 12/05/2017 0521    COAGS: Lab Results  Component Value Date   INR 2.07 12/17/2017   INR 1.01 11/15/2017   INR 1.07 11/11/2017       -52 year old male who is status post aortobifemoral bypass and right femoral to popliteal bypass by Dr. Scot Dock on 11/15/2017.  Work-up in the emergency department included a CTA which demonstrated acute thrombosis of left limb of ABF bypass.  He was taken emergently to the operating room by Dr. Zunairah Devers on 12/01/2017 and underwent thrombectomy of the left limb aVF bypass with left femoral to above-the-knee popliteal artery bypass with vein and tibial thrombectomy.   He presents to the ER today with c/o drainage, chills and redness around his incisions.  Will admit to the hospital for IV abx as he does have dacron graft present in both groins and would be catastrophic if this becomes infected.   Toes on the right foot continue to demarcate and are improving.  Swelling in right foot resolved.  Will hold Xarelto pending Dr. Nicole Cella evaluation. SQ heparin to start tomorrow afternoon.    Unclear what antihypertensives the pt has been receiving.  His BP in the ER is satisfactory so  will hold on ordering his beta blocker, ARB and HCTZ for now.   Continue statin.   Leontine Locket, PA-C [facility] [phone]  I have examined the patient, reviewed and agree with above.  Patient well-known to our service from recent aortobifemoral bypass and right femoral to popliteal bypass by Dr. Scot Dock or right foot ischemia.  He had unusual complication with Keshauna Degraffenreid thrombosis of his left limb of his aortobifemoral bypass.  He was taken to the operating room by myself where he underwent thrombectomy of his left limb of aortofemoral bypass and a left femoral to above-knee popliteal vein bypass.  He presents to the emergency room today with some drainage from his left groin and left popliteal space.  He reports having chills but his negative fevers at his nursing facility.  He is admitted for IV antibiotics due to having prosthetic graft at the base of his incision.  Also does appear to have some fluctuance in his right groin but this appears to be more of a seroma with no evidence of erythema.  After he was seen in the emergency department was noted that he had complained of swelling in his neck to the EDP and a CT scan was obtained.  This shows mediastinal and neck adenopathy and a 16 mm spiculated lung mass concerning for malignancy.  I discussed this with the hospitalist and have requested medical evaluation and of also requested transfer to hospitalist service for ongoing work-up of his presumed malignancy.  Will follow along regarding his bypass.  I will notify Dr. Scot Dock of his admission tomorrow  Curt Jews, MD 12/17/2017 5:02 PM

## 2017-12-17 NOTE — Progress Notes (Signed)
Radiology called with results of CT scan of the neck. Leontine Locket, PA notified.   Emelda Fear, RN

## 2017-12-17 NOTE — Progress Notes (Signed)
ANTICOAGULATION CONSULT NOTE - Initial Consult  Pharmacy Consult for Argatroban while Xarelto on hold Indication: DVT  Allergies  Allergen Reactions  . Pork-Derived Products     UNSPECIFIED REASON Pt doesn't eat pork  . Shellfish-Derived Products Other (See Comments)    Doesn't want to eat  . Morphine And Related Other (See Comments)    hallucinations    Patient Measurements:   Heparin Dosing Weight: n/a  Vital Signs: Temp: 98.6 F (37 C) (07/08 1456) Temp Source: Oral (07/08 1456) BP: 157/76 (07/08 1456) Pulse Rate: 97 (07/08 1500)  Labs: Recent Labs    12/17/17 1207  HGB 9.0*  HCT 29.2*  PLT 404*  APTT 38*  LABPROT 23.1*  INR 2.07  CREATININE 0.98    Estimated Creatinine Clearance: 103 mL/min (by C-G formula based on SCr of 0.98 mg/dL).   Medical History: Past Medical History:  Diagnosis Date  . Anxiety   . Arterial occlusive disease    multilevel arterial occlusive disease/notes 11/13/2017  . Arthritis    "left knee" (11/13/2017)  . Chronic back pain   . Chronic lower back pain   . Depression   . Gastric ulcer due to Helicobacter pylori 7017  . GERD (gastroesophageal reflux disease)   . High cholesterol   . Hypertension   . IBS (irritable bowel syndrome)   . Mesenteric adenitis 2011   presumed/H&P  . Migraines    "use to suffer migraines years ago" (11/13/2017)  . Peripheral vascular disease ([place])   . Pneumonia 2011  . Ulcer     Medications:  Scheduled:  . atorvastatin  40 mg Oral Daily  . gabapentin  200 mg Oral BID  . pantoprazole  40 mg Oral Daily  . potassium chloride  20-40 mEq Oral Once  . sodium chloride flush  3 mL Intravenous Q12H    Assessment: 52 yo male s/p thrombectomy last admission, discharged on chronic Xarelto.  Last dose this AM at 0830.  Now admitted with wound infection.  Pharmacy asked to begin IV anticoagulation with argatroban, given patient refusing any pork-derived products.  Goal of Therapy:  aPTT 50-90  seconds Monitor platelets by anticoagulation protocol: Yes   Plan:  Start argatroban 1 mcg/kg/min at 2030 PM tonight (time next Xarelto would be due) Check PTT 2 hrs after initiation at 2230 PM.   Daily PTT. F/u plans to resume Xarelto as able.  Marguerite Olea, [facility] Clinical Pharmacist Phone [phone]  12/17/2017 4:42 PM

## 2017-12-17 NOTE — ED Notes (Signed)
Attempted report x1. 

## 2017-12-17 NOTE — Progress Notes (Signed)
ANTICOAGULATION CONSULT NOTE - [facility] for Argatroban while Xarelto on hold Indication: DVT  Patient Measurements:    Wt: 81.6 kg Ht: 6'1" Heparin Dosing Weight: n/a  Vital Signs: Temp: 98.7 F (37.1 C) (07/08 2107) Temp Source: Oral (07/08 2107) BP: 132/60 (07/08 2041) Pulse Rate: 87 (07/08 2041)  Labs: Recent Labs    12/17/17 1207 12/17/17 1633 12/17/17 2241  HGB 9.0*  --   --   HCT 29.2*  --   --   PLT 404*  --   --   APTT 38* 37* 65*  LABPROT 23.1*  --   --   INR 2.07  --   --   HEPARINUNFRC  --  >2.20*  --   CREATININE 0.98  --   --     Estimated Creatinine Clearance: 103 mL/min (by C-G formula based on SCr of 0.98 mg/dL).   Medications:  Scheduled:  . atorvastatin  40 mg Oral Daily  . gabapentin  200 mg Oral BID  . pantoprazole  40 mg Oral Daily  . potassium chloride  20-40 mEq Oral Once  . sodium chloride flush  3 mL Intravenous Q12H    Assessment: 52 yo male s/p thrombectomy last admission, discharged on chronic Xarelto.  Last dose this AM at 0830.  Now admitted with wound infection.  Pharmacy consulted for IV anticoagulation with argatroban, given patient refusing any pork-derived products.  aPTT this evening is therapeutic after just initiating the drip (aPTT 65, goal of 50-90). Will recheck another aPTT in 2 hours to ensure therapeutic  Goal of Therapy:  aPTT 50-90 seconds Monitor platelets by anticoagulation protocol: Yes   Plan:  - Continue Argatroban 1 mcg/kg/min (82 mcg/min or 4.9 ml/hr) - Will continue to monitor for any signs/symptoms of bleeding and will follow up with aPTT level in 2 hours to ensure therapeutic  Thank you for allowing pharmacy to be a part of this patient's care.  Alycia Rossetti, PharmD, BCPS Clinical Pharmacist Pager: [phone] 12/17/2017 11:47 PM

## 2017-12-17 NOTE — Progress Notes (Signed)
Report received from [facility], RN

## 2017-12-17 NOTE — ED Triage Notes (Signed)
Pt had bypass grafting surgery on bilateral legs- it became infected and pt had second surgery. Since then, pt has been at [facility] for recovery. Pt has necrotic right toes (this is not new per EMS) with worsening pain. Pt complaining of left groin/shin surgical site pain. Pt had a PICC line in his right neck and it was removed. Pt complains of intermittent pain in his neck where PICC line was. BP 135/67, HR 88, 97% on room air.

## 2017-12-17 NOTE — Progress Notes (Signed)
Pharmacy Antibiotic Note  Tony [name] is a 52 y.o. male admitted on 12/17/2017 s/p bypass graft on legs now with presumed infection on lower extremities. Admitted with rehab facility with drainage from legs. Starting empiric abx for presumed wound infection. SCr 0.9, wbc wnl, afebrile.   Plan: -Zosyn 3.375 g IV q8h -Vancomycin 1750 mg IV x1 then 1 g IV q12h -Monitor renal fx, cultures, VT at steady state      Temp (24hrs), Avg:98.9 F (37.2 C), Min:98.5 F (36.9 C), Max:99.3 F (37.4 C)  Recent Labs  Lab 12/17/17 1207 12/17/17 1214  WBC 6.9  --   CREATININE 0.98  --   LATICACIDVEN  --  0.84     Antimicrobials this admission: 7/8 zosyn > 7/8 vancomycin >  Dose adjustments this admission: N/A  Microbiology results: N/A   Tony [name] 12/17/2017 1:45 PM

## 2017-12-17 NOTE — Progress Notes (Signed)
HEMATOLOGY/ONCOLOGY CONSULTATION NOTE  Date of Service: 12/18/2017  Patient Care Team: [name], MD as PCP - General (Family Medicine)  CHIEF COMPLAINTS/PURPOSE OF CONSULTATION:  1. " Right upper lobe nodule and Severe bulky lymphadenopathy of right cervical, bilateral supraclavicular, and upper mediastinal lymph nodes likely representing metastatic disease or lymphoproliferative disease"  2. Deep venous thrombosis involving the right subclavian vein, right brachiocephalic vein, and right internal jugular vein.  HISTORY OF PRESENTING ILLNESS:   Tony [name] is a wonderful 52 y.o. male who has been referred to [place] by Dr. Florene Glen for evaluation and management of concern for metastatic malignancy and associated DVT.   He is accompanied today by brother at bedside.   Patient has significant PAD and is s/p aortobifem bypass6/6/19 by Dr. Scot Dock. with occlusion of left limb of ABF3 weeks later requiring thrombectomy. He was on Xarelto and discharged to SNF.  He underwent fem-pop on in other leg.  The pt presented to the ED on 12/17/2017 with pain, swelling and discharge from his left groin and left, below-the-knee incision and has been treated with vancomycin + Zosyn for wound infection with improving symptoms.  He was noted to have neck swelling and masses and had a CT Neck completed on 12/17/17 with results revealing 1. Deep venous thrombosis involving the right subclavian vein, right brachiocephalic vein, and right internal jugular vein. 2. Edema in the right lower neck and supraclavicular fossa is probably related to venous congestion from DVT and/or phlebitis. No abscess. 3. Severe bulky lymphadenopathy of right cervical, bilateral supraclavicular, and upper mediastinal lymph nodes likely representing metastatic disease or lymphoproliferative disease. 4. Spiculated right upper lobe nodule measuring up to 16 mm.  Most recent lab results (12/18/17) of CBC is as follows: all  values are WNL except for RBC at 2.97, HGB at 8.6, HCT at 27.2  We discussed that findings concerning for metastatic lung malignancy vs lymphoma.  Patient was switched from Xarelto to Argatroban (for procedures, pork allergy so no heparin).  He has had a US guided needle biopsy of one of the cervical lymph node and pathology resulted pending at this time.   MEDICAL HISTORY:  Past Medical History:  Diagnosis Date  . Anxiety   . Arterial occlusive disease    multilevel arterial occlusive disease/notes 11/13/2017  . Arthritis    "left knee" (11/13/2017)  . Chronic back pain   . Chronic lower back pain   . Depression   . Gastric ulcer due to Helicobacter pylori 7741  . GERD (gastroesophageal reflux disease)   . High cholesterol   . Hypertension   . IBS (irritable bowel syndrome)   . Mesenteric adenitis 2011   presumed/H&P  . Migraines    "use to suffer migraines years ago" (11/13/2017)  . Peripheral vascular disease ([place])   . Pneumonia 2011  . Ulcer     SURGICAL HISTORY: Past Surgical History:  Procedure Laterality Date  . AORTA - BILATERAL FEMORAL ARTERY BYPASS GRAFT Bilateral 11/15/2017   Procedure: AORTA BIFEMORAL BYPASS GRAFT;  Surgeon: Angelia Mould, MD;  Location: [facility];  Service: Vascular;  Laterality: Bilateral;  . BACK SURGERY  X 4   "procedure where they burn the nerves every 6 months"  . EMBOLECTOMY Left 12/01/2017   Procedure: THROMBECTOMY OF LEFT AORTAFEMORAL BYPASS, THROMBECTOMY OF TIBIAL ARTERY;  Surgeon: Rosetta Posner, MD;  [facility];  Service: Vascular;  Laterality: Left;  . ESOPHAGOGASTRODUODENOSCOPY  08/22/2011   Procedure: ESOPHAGOGASTRODUODENOSCOPY (EGD);  Surgeon: Lajuan Lines  Pyrtle, MD;  Location: [place];  Service: Gastroenterology;  Laterality: N/A;  . FEMORAL-POPLITEAL BYPASS GRAFT Right 11/15/2017   Procedure: Right FEMORAL-Above knee POPLITEAL ARTERY Bypass Graft using nonreversed saphenous vein ;  Surgeon: Angelia Mould, MD;  Location:  [place];  Service: Vascular;  Laterality: Right;  . FEMORAL-POPLITEAL BYPASS GRAFT Left 12/01/2017   Procedure: BYPASS GRAFT FEMORAL- ABOVE KNEE POPLITEAL ARTERY WITH VEIN;  Surgeon: Rosetta Posner, MD;  Location: [place];  Service: Vascular;  Laterality: Left;  . INTRAOPERATIVE ARTERIOGRAM Right 11/15/2017   Procedure: INTRA OPERATIVE ARTERIOGRAM;  Surgeon: Angelia Mould, MD;  Location: [facility];  Service: Vascular;  Laterality: Right;  . INTRAOPERATIVE ARTERIOGRAM Left 12/01/2017   Procedure: INTRA OPERATIVE ARTERIOGRAM OF LEFT LEG;  Surgeon: Rosetta Posner, MD;  Location: [place];  Service: Vascular;  Laterality: Left;  . KNEE ARTHROSCOPY Left X 5    SOCIAL HISTORY: Social History   Socioeconomic History  . Marital status: Married    Spouse name: Not on file  . Number of children: 3  . Years of education: Not on file  . Highest education level: Not on file  Occupational History  . Occupation: Admin. Assistant    Employer: [employer]  Social Needs  . Financial resource strain: Not on file  . Food insecurity:    Worry: Not on file    Inability: Not on file  . Transportation needs:    Medical: Not on file    Non-medical: Not on file  Tobacco Use  . Smoking status: Former Smoker    Packs/day: 1.00    Years: 24.00    Pack years: 24.00    Types: Cigarettes    Start date: 11/28/2017    Last attempt to quit: 12/04/2017    Years since quitting: 0.0  . Smokeless tobacco: Never Used  Substance and Sexual Activity  . Alcohol use: Yes    Comment: 11/13/2017 "did drink wine q  1-2 months; nothing anymore"   . Drug use: Not Currently    Types: Marijuana    Comment: 11/13/2017  "last drug use was in ~ 1987 "  . Sexual activity: Not on file  Lifestyle  . Physical activity:    Days per week: Not on file    Minutes per session: Not on file  . Stress: Not on file  Relationships  . Social connections:    Talks on phone: Not on file    Gets together: Not on file    Attends religious  service: Not on file    Active member of club or organization: Not on file    Attends meetings of clubs or organizations: Not on file    Relationship status: Not on file  . Intimate partner violence:    Fear of current or ex partner: Not on file    Emotionally abused: Not on file    Physically abused: Not on file    Forced sexual activity: Not on file  Other Topics Concern  . Not on file  Social History Narrative   Lives with wife and two children.  Administrator at [employer]: Family History  Problem Relation Age of Onset  . Hypertension Mother   . Hypertension Father   . Coronary artery disease Father 48  . Heart attack Brother 33  . Coronary artery disease Brother   . Diabetes Neg Hx   . Stroke Neg Hx     ALLERGIES:  is allergic to pork-derived products;  shellfish-derived products; and morphine and related.  MEDICATIONS:  Current Facility-Administered Medications  Medication Dose Route Frequency Provider Last Rate Last Dose  . fentaNYL (SUBLIMAZE) 100 MCG/2ML injection           . lidocaine (XYLOCAINE) 1 % (with pres) injection           . midazolam (VERSED) 2 MG/2ML injection           . 0.9 %  sodium chloride infusion  250 mL Intravenous PRN Janora Norlander, MD      . acetaminophen (TYLENOL) tablet 325-650 mg  325-650 mg Oral Q4H PRN Rhyne, Samantha J, PA-C       Or  . acetaminophen (TYLENOL) suppository 325-650 mg  325-650 mg Rectal Q4H PRN Rhyne, Hulen Shouts, PA-C      . alum & mag hydroxide-simeth (MAALOX/MYLANTA) 200-200-20 MG/5ML suspension 15-30 mL  15-30 mL Oral Q2H PRN Rhyne, Samantha J, PA-C      . argatroban 1 mg/mL infusion  1 mcg/kg/min Intravenous Continuous Carney, Jessica C, RPH 4.9 mL/hr at 12/18/17 1042 1 mcg/kg/min at 12/18/17 1042  . atorvastatin (LIPITOR) tablet 40 mg  40 mg Oral Daily Rhyne, Hulen Shouts, PA-C   40 mg at 12/18/17 0951  . bisacodyl (DULCOLAX) suppository 10 mg  10 mg Rectal Daily PRN Rhyne, Samantha J, PA-C      .  gabapentin (NEURONTIN) capsule 200 mg  200 mg Oral BID Rhyne, Samantha J, PA-C   200 mg at 12/18/17 0951  . guaiFENesin-dextromethorphan (ROBITUSSIN DM) 100-10 MG/5ML syrup 15 mL  15 mL Oral Q4H PRN Rhyne, Samantha J, PA-C      . hydrALAZINE (APRESOLINE) injection 5 mg  5 mg Intravenous Q20 Min PRN Rhyne, Samantha J, PA-C      . HYDROmorphone (DILAUDID) injection 0.5 mg  0.5 mg Intravenous Q4H PRN Rhyne, Samantha J, PA-C   0.5 mg at 12/18/17 1605  . labetalol (NORMODYNE,TRANDATE) injection 10 mg  10 mg Intravenous Q10 min PRN Rhyne, Hulen Shouts, PA-C      . LORazepam (ATIVAN) tablet 1 mg  1 mg Oral Q8H PRN Rhyne, Hulen Shouts, PA-C   1 mg at 12/17/17 1823  . metoprolol tartrate (LOPRESSOR) injection 2-5 mg  2-5 mg Intravenous Q2H PRN Rhyne, Samantha J, PA-C      . ondansetron (ZOFRAN) injection 4 mg  4 mg Intravenous Q6H PRN Rhyne, Samantha J, PA-C      . oxyCODONE-acetaminophen (PERCOCET/ROXICET) 5-325 MG per tablet 1-2 tablet  1-2 tablet Oral Q4H PRN Rhyne, Hulen Shouts, PA-C   2 tablet at 12/18/17 0951  . pantoprazole (PROTONIX) EC tablet 40 mg  40 mg Oral Daily Rhyne, Hulen Shouts, PA-C   40 mg at 12/18/17 0951  . phenol (CHLORASEPTIC) mouth spray 1 spray  1 spray Mouth/Throat PRN Rhyne, Samantha J, PA-C      . piperacillin-tazobactam (ZOSYN) IVPB 3.375 g  3.375 g Intravenous Q8H Angelia Mould, MD 12.5 mL/hr at 12/18/17 1139 3.375 g at 12/18/17 1139  . polyethylene glycol (MIRALAX / GLYCOLAX) packet 17 g  17 g Oral Daily PRN Rhyne, Samantha J, PA-C      . potassium chloride SA (K-DUR,KLOR-CON) CR tablet 20-40 mEq  20-40 mEq Oral Once Rhyne, Samantha J, PA-C      . sodium chloride flush (NS) 0.9 % injection 3 mL  3 mL Intravenous Q12H Rhyne, Samantha J, PA-C   3 mL at 12/18/17 0953  . sodium chloride flush (NS) 0.9 % injection 3 mL  3 mL Intravenous PRN Rhyne, Hulen Shouts, PA-C      . vancomycin (VANCOCIN) IVPB 1000 mg/200 mL premix  1,000 mg Intravenous Q12H Angelia Mould, MD   Stopped  at 12/18/17 [phone]    REVIEW OF SYSTEMS:    10 Point review of Systems was done is negative except as noted above.  PHYSICAL EXAMINATION: ECOG PERFORMANCE STATUS: 2 - Symptomatic, <50% confined to bed  . Vitals:   12/18/17 0403 12/18/17 0807  BP: 140/76 130/70  Pulse: 89 89  Resp: 18 16  Temp: 98.4 F (36.9 C) 98.3 F (36.8 C)  SpO2: 98% 98%   Filed Weights   12/18/17 1613  Weight: 157 lb 13.6 oz (71.6 kg)   .Body mass index is 18.72 kg/m.  GENERAL:alert, in no acute distress and comfortable SKIN: no acute rashes, no significant lesions EYES: conjunctiva are pink and non-injected, sclera anicteric OROPHARYNX: MMM, no exudates, no oropharyngeal erythema or ulceration NECK: supple, no JVD LYMPH:  no palpable lymphadenopathy in the cervical, axillary or inguinal regions LUNGS: clear to auscultation b/l with normal respiratory effort HEART: regular rate & rhythm ABDOMEN:  normoactive bowel sounds , non tender, not distended. Extremity: no pedal edema PSYCH: alert & oriented x 3 with fluent speech NEURO: no focal motor/sensory deficits  LABORATORY DATA:  I have reviewed the data as listed  . CBC Latest Ref Rng & Units 12/18/2017 12/17/2017 12/05/2017  WBC 4.0 - 10.5 K/uL 6.1 6.9 7.6  Hemoglobin 13.0 - 17.0 g/dL 8.6(L) 9.0(L) 7.9(L)  Hematocrit 39.0 - 52.0 % 27.2(L) 29.2(L) 25.3(L)  Platelets 150 - 400 K/uL 383 404(H) 378   . CBC    Component Value Date/Time   WBC 6.1 12/18/2017 0054   RBC 2.97 (L) 12/18/2017 0054   HGB 8.6 (L) 12/18/2017 0054   HCT 27.2 (L) 12/18/2017 0054   PLT 383 12/18/2017 0054   MCV 91.6 12/18/2017 0054   MCH 29.0 12/18/2017 0054   MCHC 31.6 12/18/2017 0054   RDW 12.7 12/18/2017 0054   LYMPHSABS 0.8 12/17/2017 1207   MONOABS 0.6 12/17/2017 1207   EOSABS 0.2 12/17/2017 1207   BASOSABS 0.1 12/17/2017 1207    . CMP Latest Ref Rng & Units 12/18/2017 12/17/2017 12/05/2017  Glucose 70 - 99 mg/dL 135(H) 112(H) 118(H)  BUN 6 - 20 mg/dL 10 13 7     Creatinine 0.61 - 1.24 mg/dL 0.94 0.98 1.00  Sodium 135 - 145 mmol/L 137 136 137  Potassium 3.5 - 5.1 mmol/L 3.5 4.0 3.9  Chloride 98 - 111 mmol/L 102 101 99  CO2 22 - 32 mmol/L 27 27 30   Calcium 8.9 - 10.3 mg/dL 8.9 9.4 9.0  Total Protein 6.5 - 8.1 g/dL - 7.2 -  Total Bilirubin 0.3 - 1.2 mg/dL - 0.6 -  Alkaline Phos 38 - 126 U/L - 127(H) -  AST 15 - 41 U/L - 21 -  ALT 0 - 44 U/L - 20 -     RADIOGRAPHIC STUDIES: I have personally reviewed the radiological images as listed and agreed with the findings in the report. Ct Soft Tissue Neck W Contrast  Result Date: 12/17/2017 CLINICAL DATA:  52 y/o M; recently removed right neck PICC line with swelling and pain in the region of PICC line. EXAM: CT NECK WITH CONTRAST TECHNIQUE: Multidetector CT imaging of the neck was performed using the standard protocol following the bolus administration of intravenous contrast. CONTRAST:  100 cc Omnipaque 300 COMPARISON:  None. FINDINGS: Pharynx and larynx: Normal. No  mass or swelling. Salivary glands: No inflammation, mass, or stone. Thyroid: Normal. Lymph nodes: Right cervical, bilateral supraclavicular, and upper mediastinal bulky lymphadenopathy. A right upper paratracheal lymph node measures 5.0 x 3.5 cm (series 3, image 109), left supraclavicular node measures 3.1 x 1.7 cm (series 3, image 85), and a right supraclavicular node measures 1.6 x 2.1 cm (series 3, image 68). Vascular: Deep venous thrombosis involving right subclavian vein, right brachiocephalic vein, and right internal jugular vein. Limited intracranial: Negative. Visualized orbits: Negative. Mastoids and visualized paranasal sinuses: Small right maxillary sinus mucous retention cyst. Skeleton: No acute or aggressive process. Upper chest: Spiculated nodule in the right upper lobe measuring 16 x 12 mm (series 3, image 115). Other: Mild edema in the right supraclavicular fossa and lower neck. IMPRESSION: 1. Deep venous thrombosis involving the right  subclavian vein, right brachiocephalic vein, and right internal jugular vein. 2. Edema in the right lower neck and supraclavicular fossa is probably related to venous congestion from DVT and/or phlebitis. No abscess. 3. Severe bulky lymphadenopathy of right cervical, bilateral supraclavicular, and upper mediastinal lymph nodes likely representing metastatic disease or lymphoproliferative disease. 4. Spiculated right upper lobe nodule measuring up to 16 mm. Consider one of the following in 3 months for both low-risk and high-risk individuals: (a) repeat chest CT, (b) follow-up PET-CT, or (c) tissue sampling. This recommendation follows the consensus statement: Guidelines for Management of Incidental Pulmonary Nodules Detected on CT Images: From the Fleischner Society 2017; Radiology 2017; 284:228-243. These results will be called to the ordering clinician or representative by the Radiologist Assistant, and communication documented in the PACS or zVision Dashboard. Electronically Signed   By: Kristine Garbe M.D.   On: 12/17/2017 15:04   Dg Ang/ext/uni/or Left  Result Date: 12/01/2017 CLINICAL DATA:  Intraoperative arteriogram of the left lower leg EXAM: LEFT ANG/EXT/UNI/ OR FLUOROSCOPY TIME:  Not provided COMPARISON:  None FINDINGS: A single spot intraoperative radiographic image of the left knee provided for review Intra-arterial contrast has been injected from a more proximal location. Images demonstrate the sequela of presumably reverse venous distal femoral bypass grafting. The distal anastomosis appears widely patent without hemodynamically significant stenosis. The distal most aspect of the left superficial femoral artery as well as the left above and below knee popliteal artery appear widely patent. There are mixed occlusive and nonocclusive filling defect within the tibioperoneal trunk with an apparent single vessel runoff to the left lower leg via the posterior tibial artery. Skin staples are  noted about the medial aspect of the left lower leg with associated scattered foci of subcutaneous. IMPRESSION: Distal anastomosis presumably reverse venous distal femoral bypass grafting appears widely patent, however there are mixed occlusive and nonocclusive filling defects with tibioperoneal trunk and apparent single-vessel runoff to the left lower leg via the posterior tibial artery. Electronically Signed   By: Sandi Mariscal M.D.   On: 12/01/2017 14:18    ASSESSMENT & PLAN:   52 y.o. male with   1. Right upper lobe nodule and extensive mediastinal and cervical lymphadenopathy concerning for metastatic lung cancer though other metastatic tumor possible.  2. Metastatic cancer related hypercoagulable status   3. Extensive Deep venous thrombosis involving the right subclavian vein, right brachiocephalic vein, and right internal jugular vein - due to cancer + vascular surgery + surgical site infection  4. PAD with recent vascular bypass  PLAN - discussed all the available labs and imaging studies and diagnostic considerations. -patient is s/p biopsy of cervical LN -pending pathology result . -  currently on Argatroban for DVT and arterial bypass graft thrombosis (given heparin/pork allergy - could consider transitioning to fondaparinux for ongoing anticoagulation on discharge). -will need Ct chest/abd/pelvis and MRI brain for staging forhis newly noted metastatic malignancy. -will f/u with pathology results to determine further treatment recommendations on that front  5. Left groin would infection post-op - improving abx per hospitalist.  6.  Patient Active Problem List   Diagnosis Date Noted  . S/P aortobifemoral bypass surgery 12/17/2017  . Mass of upper lobe of right lung 12/17/2017  . Postoperative wound infection 12/17/2017  . Weight loss 12/17/2017  . Cervical lymphadenopathy 12/17/2017  . DVT (deep venous thrombosis) ([place]) 12/17/2017  . Aortoiliac occlusive disease ([place])  11/15/2017  . Pain of right lower extremity due to ischemia 11/13/2017  . Hyperlipidemia LDL goal <70 11/13/2017  . PVD (peripheral vascular disease) ([place]) 11/11/2017  . H. pylori infection 10/11/2011  . Duodenal ulcer 08/23/2011  . IBS (irritable bowel syndrome) 08/18/2011  . Depression with anxiety 05/11/2009  . EPIGASTRIC PAIN, CHRONIC 05/11/2009  . SMOKER 09/01/2008  . INSOMNIA, CHRONIC 09/01/2008  . Essential hypertension 09/01/2008  . PALPITATIONS, CHRONIC 09/01/2008  . GASTRITIS 10/24/2007  -noted   All of the patients questions were answered with apparent satisfaction. The patient knows to call the clinic with any problems, questions or concerns.  . The total time spent in the appointment was 80 minutes and more than 50% was on counseling and direct patient cares and co-ordinating cares with hospitalist team    Sullivan Lone MD [place] AAHIVMS [facility] Hematology/Oncology [facility]  (Office):       [phone] (Work cell):  [phone] (Fax):           [phone]  12/18/2017 4:15 PM  I, [place], am acting as a Education administrator for Dr Irene Limbo.   .I have reviewed the above documentation for accuracy and completeness, and I agree with the above. Sullivan Lone MD MS

## 2017-12-17 NOTE — ED Notes (Signed)
Attempted report x 2 

## 2017-12-17 NOTE — ED Provider Notes (Signed)
[facility] EMERGENCY DEPARTMENT Provider Note   CSN: [id number] Arrival date & time: 12/17/17  1036     History   Chief Complaint Chief Complaint  Patient presents with  . Post-op Problem    HPI Tony [name] is a 52 y.o. male presenting for evaluation of left leg pain and right neck swelling.  Patient states he has had multiple surgeries on the vessels of his legs.  Most recent surgery was on his left leg on June 22.  At he states over the past several days, he has been having worsening pain at the incision sites of his left leg, mostly at the medial aspect of his proximal lower leg and at the groin of his left leg.  He reports both sites are red and tender.  He has had ear drainage from both.  He reports shooting pain down to his left heel.  Additionally, patient states that since the PICC line was removed from his right neck, he has had worsening swelling and pain.  He reports chills without fevers.  No chest pain, shortness of breath, nausea, vomiting, abdominal pain.  He is not currently on any antibiotics.  He is taking medication for pain, unsure which one, last had a early this morning.  He is on Xarelto, has been taking it as prescribed. Pain is constant, nothing makes it better.  HPI  Past Medical History:  Diagnosis Date  . Anxiety   . Arterial occlusive disease    multilevel arterial occlusive disease/notes 11/13/2017  . Arthritis    "left knee" (11/13/2017)  . Chronic back pain   . Chronic lower back pain   . Depression   . Gastric ulcer due to Helicobacter pylori 2637  . GERD (gastroesophageal reflux disease)   . High cholesterol   . Hypertension   . IBS (irritable bowel syndrome)   . Mesenteric adenitis 2011   presumed/H&P  . Migraines    "use to suffer migraines years ago" (11/13/2017)  . Peripheral vascular disease ([place])   . Pneumonia 2011  . Ulcer     Patient Active Problem List   Diagnosis Date Noted  . Aortoiliac occlusive disease  ([place]) 11/15/2017  . Pain of right lower extremity due to ischemia 11/13/2017  . Hyperlipidemia LDL goal <70 11/13/2017  . PVD (peripheral vascular disease) ([place]) 11/11/2017  . H. pylori infection 10/11/2011  . Duodenal ulcer 08/23/2011  . IBS (irritable bowel syndrome) 08/18/2011  . Depression with anxiety 05/11/2009  . EPIGASTRIC PAIN, CHRONIC 05/11/2009  . SMOKER 09/01/2008  . INSOMNIA, CHRONIC 09/01/2008  . Essential hypertension 09/01/2008  . PALPITATIONS, CHRONIC 09/01/2008  . GASTRITIS 10/24/2007    Past Surgical History:  Procedure Laterality Date  . AORTA - BILATERAL FEMORAL ARTERY BYPASS GRAFT Bilateral 11/15/2017   Procedure: AORTA BIFEMORAL BYPASS GRAFT;  Surgeon: Angelia Mould, MD;  Location: [place];  Service: Vascular;  Laterality: Bilateral;  . BACK SURGERY  X 4   "procedure where they burn the nerves every 6 months"  . EMBOLECTOMY Left 12/01/2017   Procedure: THROMBECTOMY OF LEFT AORTAFEMORAL BYPASS, THROMBECTOMY OF TIBIAL ARTERY;  Surgeon: Rosetta Posner, MD;  Location: [facility] OR;  Service: Vascular;  Laterality: Left;  . ESOPHAGOGASTRODUODENOSCOPY  08/22/2011   Procedure: ESOPHAGOGASTRODUODENOSCOPY (EGD);  Surgeon: [name], MD;  Location: [place];  Service: Gastroenterology;  Laterality: N/A;  . FEMORAL-POPLITEAL BYPASS GRAFT Right 11/15/2017   Procedure: Right FEMORAL-Above knee POPLITEAL ARTERY Bypass Graft using nonreversed saphenous vein ;  Surgeon:  Angelia Mould, MD;  Location: [facility] OR;  Service: Vascular;  Laterality: Right;  . FEMORAL-POPLITEAL BYPASS GRAFT Left 12/01/2017   Procedure: BYPASS GRAFT FEMORAL- ABOVE KNEE POPLITEAL ARTERY WITH VEIN;  Surgeon: Rosetta Posner, MD;  Location: [place];  Service: Vascular;  Laterality: Left;  . INTRAOPERATIVE ARTERIOGRAM Right 11/15/2017   Procedure: INTRA OPERATIVE ARTERIOGRAM;  Surgeon: Angelia Mould, MD;  Location: [place];  Service: Vascular;  Laterality: Right;  . INTRAOPERATIVE ARTERIOGRAM Left  12/01/2017   Procedure: INTRA OPERATIVE ARTERIOGRAM OF LEFT LEG;  Surgeon: Rosetta Posner, MD;  Location: [place];  Service: Vascular;  Laterality: Left;  . KNEE ARTHROSCOPY Left X 5        Home Medications    Prior to Admission medications   Medication Sig Start Date End Date Taking? Authorizing Provider  atorvastatin (LIPITOR) 40 MG tablet Take 1 tablet (40 mg total) by mouth daily. 11/26/17   Ulyses Amor, PA-C  gabapentin (NEURONTIN) 100 MG capsule Take 2 capsules (200 mg total) by mouth 2 (two) times daily. 11/11/17   Ulyses Amor, PA-C  gabapentin (NEURONTIN) 300 MG capsule Take 1 capsule (300 mg total) by mouth 3 (three) times daily. Patient not taking: Reported on 12/01/2017 11/26/17   Ulyses Amor, PA-C  hydrochlorothiazide (HYDRODIURIL) 25 MG tablet Take 25 mg by mouth daily.    [provider]  LORazepam (ATIVAN) 1 MG tablet Take 1 mg by mouth every 8 (eight) hours as needed for anxiety.    [provider]  meloxicam (MOBIC) 15 MG tablet Take 15 mg by mouth daily as needed for pain.    [provider]  metoprolol succinate (TOPROL-XL) 100 MG 24 hr tablet Take 100 mg by mouth at bedtime. Take with or immediately following a meal.    [provider]  oxyCODONE-acetaminophen (PERCOCET/ROXICET) 5-325 MG tablet Take 1-2 tablets by mouth every 6 (six) hours as needed for severe pain. 12/05/17   Dagoberto Ligas, PA-C  Rivaroxaban (XARELTO) 15 MG TABS tablet Take 1 tablet (15 mg total) by mouth 2 (two) times daily with a meal. 12/05/17   Dagoberto Ligas, PA-C  rivaroxaban (XARELTO) 20 MG TABS tablet Take 1 tablet (20 mg total) by mouth daily with supper. 12/24/17   Dagoberto Ligas, PA-C  valsartan (DIOVAN) 160 MG tablet Take 160 mg by mouth daily. 11/09/17   [provider]    Family History Family History  Problem Relation Age of Onset  . Hypertension Mother   . Hypertension Father   . Coronary artery disease Father 30  . Heart attack  Brother 47  . Coronary artery disease Brother   . Diabetes Neg Hx   . Stroke Neg Hx     Social History Social History   Tobacco Use  . Smoking status: Former Smoker    Packs/day: 1.00    Years: 24.00    Pack years: 24.00    Types: Cigarettes    Start date: 11/28/2017  . Smokeless tobacco: Never Used  Substance Use Topics  . Alcohol use: Yes    Comment: 11/13/2017 "did drink wine q  1-2 months; nothing anymore"   . Drug use: Not Currently    Types: Marijuana    Comment: 11/13/2017  "last drug use was in ~ 1987 "     Allergies   Pork-derived products and Morphine and related   Review of Systems Review of Systems  Constitutional: Positive for chills.  HENT:  R neck pain/swelling  Skin: Positive for color change.  Hematological: Bruises/bleeds easily.  All other systems reviewed and are negative.    Physical Exam Updated Vital Signs BP (!) 122/51   Pulse 72   Temp 99.3 F (37.4 C) (Rectal)   Resp 15   SpO2 97%   Physical Exam  Constitutional: He is oriented to person, place, and time. He appears well-developed and well-nourished.  Chronically ill appearing male, appears very uncomfortable due to pain.   HENT:  Head: Normocephalic and atraumatic.    Swelling of the R lower neck. TTP of mass. No erythema or warmth. No streaking.   Eyes: Pupils are equal, round, and reactive to light. Conjunctivae and EOM are normal.  Neck: Normal range of motion. Neck supple.  Cardiovascular: Normal rate, regular rhythm and intact distal pulses.  Pulmonary/Chest: Effort normal and breath sounds normal. No respiratory distress. He has no wheezes.  Abdominal: Soft. He exhibits no distension and no mass. There is no tenderness. There is no guarding.  Musculoskeletal: Normal range of motion.  Pedal pulses intact bilaterally. chronic ischemic toes of R leg. Good cap refill of L foot  Neurological: He is alert and oriented to person, place, and time.  Skin: Skin is warm and dry.  Capillary refill takes less than 2 seconds. There is erythema.  erythema surrounding mild dehiscence of L inner proximal lower leg incision. No active drainage. See picture.  TTP and erythema of L groin incision site without drainage. See picture remaining incision sites healing well without tenderness or erythema.   Psychiatric: He has a normal mood and affect.  Nursing note and vitals reviewed.         ED Treatments / Results  Labs (all labs ordered are listed, but only abnormal results are displayed) Labs Reviewed  CBC WITH DIFFERENTIAL/PLATELET - Abnormal; Notable for the following components:      Result Value   RBC 3.17 (*)    Hemoglobin 9.0 (*)    HCT 29.2 (*)    Platelets 404 (*)    All other components within normal limits  COMPREHENSIVE METABOLIC PANEL  PROTIME-INR  APTT  I-STAT CG4 LACTIC ACID, ED    EKG EKG Interpretation  Date/Time:  Monday December 17 2017 10:51:15 EDT Ventricular Rate:  83 PR Interval:    QRS Duration: 68 QT Interval:  361 QTC Calculation: 425 R Axis:   75 Text Interpretation:  Sinus rhythm Probable left atrial enlargement Left ventricular hypertrophy Anterior Q waves, possibly due to LVH Confirmed by Sherwood Gambler [phone]) on 12/17/2017 10:53:55 AM   Radiology No results found.  Procedures Procedures (including critical care time)  Medications Ordered in ED Medications  fentaNYL (SUBLIMAZE) injection 50 mcg (50 mcg Intravenous Given 12/17/17 1221)     Initial Impression / Assessment and Plan / ED Course  I have reviewed the triage vital signs and the nursing notes.  Pertinent labs & imaging results that were available during my care of the patient were reviewed by me and considered in my medical decision making (see chart for details).     Patient presented for evaluation of pain, redness, and swelling at his incision sites and pain of his right neck.  Physical exam shows patient who appears very uncomfortable due to pain.  Feels  hot to touch, rectal temp 99.3.  To the incision sites do appear erythematous and tender, one site with mild dehiscence.  No active drainage noted.  Pedal pulses intact bilaterally.  Obvious swelling of  the right lower neck with tenderness.  No obvious tracheal deviation or airway compromise.  No erythema or warmth.  Concern for possible postop/graft infection of the leg, infection of the neck, or possible clot, despite being on Xarelto.  Will obtain labs and CT neck soft tissue for further evaluation. fentanyl for pain. Case discussed with attending, Dr. Regenia Skeeter agrees to plan. Will consult with vascular for further evaluation and management.  Discussed with Dr. Donnetta Hutching from vascular surgery, will evaluate the patient.  Patient to be admitted by vascular surgery.   Final Clinical Impressions(s) / ED Diagnoses   Final diagnoses:  Post-op pain  Neck swelling    ED Discharge Orders    None       Franchot Heidelberg, PA-C 12/17/17 1417    Sherwood Gambler, MD 12/17/17 1609

## 2017-12-17 NOTE — Progress Notes (Signed)
Patient arrived to room at this time via stretcher from ED. Telemetry applied and CCMD notified. Assessment and V/S done. Patient oriented to room and how to call the nurse with any needs.   Emelda Fear, RN

## 2017-12-17 NOTE — Consult Note (Signed)
Medical Consultation   Tony [name]  NID:[id number]  DOB: 01-22-1966  DOA: 12/17/2017  PCP: [name], MD   Requesting physician: Dr. Scot Dock  Reason for consultation: New finding of RUL spiculated lesion, lymphadenopathy and venous thrombosis   History of Present Illness: Tony [name] is an 52 y.o. male who is s/p aortobifem bypass 11/15/17 by Dr. Scot Dock. 3 weeks later found to have occlusion of left limb of ABF which is unusual. Underwent limb thrombectomy limb, fem pop on other side on 6/22. Pt seemed to be doing ok and was discharged to SNF on Xarelto but came in to ED today with drainage from his L groin and L knee incision. Also c/o chills, neck swelling. He was admitted to vascular surgery, started on vanc and zosyn for fem-pop infection, and due to neck swelling, CT was done showing spiculated nodule in RUL, 16 cm. Concern for lung CA, metastatic nodes in neck and upper mediastinum. Argatroban ordered to begin 12 hours after his am xarelto dose; xarelto now on hold. Cannot get heparin 2/2 pork product allergy. Medicine consulted for new finding on CT which likely represents lung cancer with bulky lymphadenopathy.  Pt reports to me that he has lost about 40 lbs in a month. He has had night sweats as well. He reports anorexia during the past several weeks. He reports sharp R upper chest pain, worse with deep breathing and coughing as well as a several week h/o nonproductive cough. No hemoptysis. He quit smoking approximately 6 weeks ago. He does not drink alcohol.    Review of Systems:  ROS As per HPI otherwise 10 point review of systems negative.    Past Medical History: Past Medical History:  Diagnosis Date  . Anxiety   . Arterial occlusive disease    multilevel arterial occlusive disease/notes 11/13/2017  . Arthritis    "left knee" (11/13/2017)  . Chronic back pain   . Chronic lower back pain   . Depression   . Gastric ulcer due to Helicobacter  pylori 1443  . GERD (gastroesophageal reflux disease)   . High cholesterol   . Hypertension   . IBS (irritable bowel syndrome)   . Mesenteric adenitis 2011   presumed/H&P  . Migraines    "use to suffer migraines years ago" (11/13/2017)  . Peripheral vascular disease ([place])   . Pneumonia 2011  . Ulcer     Past Surgical History: Past Surgical History:  Procedure Laterality Date  . AORTA - BILATERAL FEMORAL ARTERY BYPASS GRAFT Bilateral 11/15/2017   Procedure: AORTA BIFEMORAL BYPASS GRAFT;  Surgeon: Angelia Mould, MD;  Location: [place];  Service: Vascular;  Laterality: Bilateral;  . BACK SURGERY  X 4   "procedure where they burn the nerves every 6 months"  . EMBOLECTOMY Left 12/01/2017   Procedure: THROMBECTOMY OF LEFT AORTAFEMORAL BYPASS, THROMBECTOMY OF TIBIAL ARTERY;  Surgeon: Rosetta Posner, MD;  Location: [facility] OR;  Service: Vascular;  Laterality: Left;  . ESOPHAGOGASTRODUODENOSCOPY  08/22/2011   Procedure: ESOPHAGOGASTRODUODENOSCOPY (EGD);  Surgeon: [name], MD;  Location: [place];  Service: Gastroenterology;  Laterality: N/A;  . FEMORAL-POPLITEAL BYPASS GRAFT Right 11/15/2017   Procedure: Right FEMORAL-Above knee POPLITEAL ARTERY Bypass Graft using nonreversed saphenous vein ;  Surgeon: Angelia Mould, MD;  Location: [place];  Service: Vascular;  Laterality: Right;  . FEMORAL-POPLITEAL BYPASS GRAFT Left 12/01/2017   Procedure: BYPASS GRAFT FEMORAL- ABOVE KNEE POPLITEAL  ARTERY WITH VEIN;  Surgeon: Rosetta Posner, MD;  Location: [place];  Service: Vascular;  Laterality: Left;  . INTRAOPERATIVE ARTERIOGRAM Right 11/15/2017   Procedure: INTRA OPERATIVE ARTERIOGRAM;  Surgeon: Angelia Mould, MD;  Location: [place];  Service: Vascular;  Laterality: Right;  . INTRAOPERATIVE ARTERIOGRAM Left 12/01/2017   Procedure: INTRA OPERATIVE ARTERIOGRAM OF LEFT LEG;  Surgeon: Rosetta Posner, MD;  Location: [place];  Service: Vascular;  Laterality: Left;  . KNEE ARTHROSCOPY Left X 5      Allergies:   Allergies  Allergen Reactions  . Pork-Derived Products     UNSPECIFIED REASON Pt doesn't eat pork  . Shellfish-Derived Products Other (See Comments)    Doesn't want to eat  . Morphine And Related Other (See Comments)    hallucinations     Social History:  reports that he has quit smoking. His smoking use included cigarettes. He started smoking about 2 weeks ago. He has a 24.00 pack-year smoking history. He has never used smokeless tobacco. He reports that he drinks alcohol. He reports that he has current or past drug history. Drug: Marijuana.   Family History: Family History  Problem Relation Age of Onset  . Hypertension Mother   . Hypertension Father   . Coronary artery disease Father 102  . Heart attack Brother 36  . Coronary artery disease Brother   . Diabetes Neg Hx   . Stroke Neg Hx       Physical Exam: Vitals:   12/17/17 1400 12/17/17 1415 12/17/17 1456 12/17/17 1500  BP: (!) 112/50 133/71 (!) 157/76   Pulse: 65   97  Resp: 14   (!) 23  Temp:   98.6 F (37 C)   TempSrc:   Oral   SpO2: 96% 99% 99% 99%    Constitutional: Alert and awake, oriented x3, not in any acute distress. Eyes: PERLA, EOMI, irises appear normal, anicteric sclera,  ENMT: external ears and nose appear normal, normal hearing, Lips appear normal, oropharynx mucosa, tongue, posterior pharynx appear normal; dry mucous membranes Neck: neck with palpable lymphadenopathy R cervical and bilateral supraclavicular areas; neck with normal ROM, no thyromegaly, no JVD  CVS: S1-S2 clear. Hyperdynamic precordium. 3/6 systolic M at apex. No LE edema  Respiratory:  clear to auscultation bilaterally, no wheezing, rales or rhonchi. Respiratory effort normal. No accessory muscle use.  Abdomen: soft nontender, nondistended, normal bowel sounds, no hepatosplenomegaly, no hernias  Musculoskeletal: no cyanosis, clubbing or edema noted bilaterally Neuro: Cranial nerves II-XII intact, strength,  sensation, reflexes all WNL Psych: judgement and insight appear normal, affect is flat Skin: no rashes or lesions or ulcers, no induration or nodules. R groin with erythema, no drainage or purulence. Tender to palpation.   Data reviewed:  I have personally reviewed the recent labs and imaging studies  Pertinent Labs:  Creat 0.98 Albumin 3.1 AlkP 127 Lactic acid 0.84 WBC 6.9 Hgb 9.0 Plt 404 INR 2.07   Inpatient Medications:   Scheduled Meds: . atorvastatin  40 mg Oral Daily  . gabapentin  200 mg Oral BID  . [START ON 12/18/2017] heparin  5,000 Units Subcutaneous Q8H  . pantoprazole  40 mg Oral Daily  . potassium chloride  20-40 mEq Oral Once  . sodium chloride flush  3 mL Intravenous Q12H   Continuous Infusions: . sodium chloride 250 mL (12/17/17 1531)  . piperacillin-tazobactam    . piperacillin-tazobactam (ZOSYN)  IV    . vancomycin 1,750 mg (12/17/17 1547)  . [START ON 12/18/2017]  vancomycin       Radiological Exams on Admission: Ct Soft Tissue Neck W Contrast  Result Date: 12/17/2017 CLINICAL DATA:  52 y/o M; recently removed right neck PICC line with swelling and pain in the region of PICC line. EXAM: CT NECK WITH CONTRAST TECHNIQUE: Multidetector CT imaging of the neck was performed using the standard protocol following the bolus administration of intravenous contrast. CONTRAST:  100 cc Omnipaque 300 COMPARISON:  None. FINDINGS: Pharynx and larynx: Normal. No mass or swelling. Salivary glands: No inflammation, mass, or stone. Thyroid: Normal. Lymph nodes: Right cervical, bilateral supraclavicular, and upper mediastinal bulky lymphadenopathy. A right upper paratracheal lymph node measures 5.0 x 3.5 cm (series 3, image 109), left supraclavicular node measures 3.1 x 1.7 cm (series 3, image 85), and a right supraclavicular node measures 1.6 x 2.1 cm (series 3, image 68). Vascular: Deep venous thrombosis involving right subclavian vein, right brachiocephalic vein, and right internal  jugular vein. Limited intracranial: Negative. Visualized orbits: Negative. Mastoids and visualized paranasal sinuses: Small right maxillary sinus mucous retention cyst. Skeleton: No acute or aggressive process. Upper chest: Spiculated nodule in the right upper lobe measuring 16 x 12 mm (series 3, image 115). Other: Mild edema in the right supraclavicular fossa and lower neck. IMPRESSION: 1. Deep venous thrombosis involving the right subclavian vein, right brachiocephalic vein, and right internal jugular vein. 2. Edema in the right lower neck and supraclavicular fossa is probably related to venous congestion from DVT and/or phlebitis. No abscess. 3. Severe bulky lymphadenopathy of right cervical, bilateral supraclavicular, and upper mediastinal lymph nodes likely representing metastatic disease or lymphoproliferative disease. 4. Spiculated right upper lobe nodule measuring up to 16 mm. Consider one of the following in 3 months for both low-risk and high-risk individuals: (a) repeat chest CT, (b) follow-up PET-CT, or (c) tissue sampling. This recommendation follows the consensus statement: Guidelines for Management of Incidental Pulmonary Nodules Detected on CT Images: From the Fleischner Society 2017; Radiology 2017; 284:228-243. These results will be called to the ordering clinician or representative by the Radiologist Assistant, and communication documented in the PACS or zVision Dashboard. Electronically Signed   By: Kristine Garbe M.D.   On: 12/17/2017 15:04    Impression/Recommendations Principal Problem:   Postoperative wound infection Active Problems:   Essential hypertension   Hyperlipidemia LDL goal <70   S/P aortobifemoral bypass surgery   Mass of upper lobe of right lung   Weight loss   Cervical lymphadenopathy   DVT (deep venous thrombosis) (HCC)  This is an unfortunate case; 52 y/o M with smoking history, peripheral arterial disease s/p aorto bi-fem and fem-pop with significant  clotting postoperatively now found to have 16 mm RUL spiculated nodule and bulky cervical, supraclavicular and mediastinal LAD, most likely lung CA with associated hypercoagulable state.  Continue with argatroban for extensive DVT; Hold xarelto given need for biopsy of lymph node which has been ordered. NPO after MN. Hematology/oncology has been consulted and we greatly appreciate their assistance. He will need lifelong anticoagulation.   Continue vanc and zosyn for wound infection, per pharmacy.  He appears dehydrated on exam so I have ordered NS infusion at 100 cc/h.  Thank you for this consultation. Patient will be transferred to our service, [facility], tomorrow 7/9.     Time Spent: 15 min  Janora Norlander M.D. [facility] 12/17/2017, 3:51 PM

## 2017-12-18 ENCOUNTER — Encounter (HOSPITAL_COMMUNITY): Payer: Self-pay | Admitting: Physician Assistant

## 2017-12-18 ENCOUNTER — Inpatient Hospital Stay (HOSPITAL_COMMUNITY): Payer: BLUE CROSS/BLUE SHIELD

## 2017-12-18 DIAGNOSIS — T8149XA Infection following a procedure, other surgical site, initial encounter: Secondary | ICD-10-CM

## 2017-12-18 DIAGNOSIS — R918 Other nonspecific abnormal finding of lung field: Secondary | ICD-10-CM

## 2017-12-18 DIAGNOSIS — I82621 Acute embolism and thrombosis of deep veins of right upper extremity: Secondary | ICD-10-CM

## 2017-12-18 DIAGNOSIS — R590 Localized enlarged lymph nodes: Secondary | ICD-10-CM

## 2017-12-18 HISTORY — PX: IR US GUIDE BX ASP/DRAIN: IMG2392

## 2017-12-18 LAB — BASIC METABOLIC PANEL
Anion gap: 8 (ref 5–15)
BUN: 10 mg/dL (ref 6–20)
CALCIUM: 8.9 mg/dL (ref 8.9–10.3)
CO2: 27 mmol/L (ref 22–32)
CREATININE: 0.94 mg/dL (ref 0.61–1.24)
Chloride: 102 mmol/L (ref 98–111)
GFR calc non Af Amer: >60 mL/min (ref >60)
Glucose, Bld: 135 mg/dL — ABNORMAL HIGH (ref 70–99)
Potassium: 3.5 mmol/L (ref 3.5–5.1)
Sodium: 137 mmol/L (ref 135–145)

## 2017-12-18 LAB — CBC
HCT: 27.2 % — ABNORMAL LOW (ref 39.0–52.0)
Hemoglobin: 8.6 g/dL — ABNORMAL LOW (ref 13.0–17.0)
MCH: 29 pg (ref 26.0–34.0)
MCHC: 31.6 g/dL (ref 30.0–36.0)
MCV: 91.6 fL (ref 78.0–100.0)
Platelets: 383 10*3/uL (ref 150–400)
RBC: 2.97 MIL/uL — ABNORMAL LOW (ref 4.22–5.81)
RDW: 12.7 % (ref 11.5–15.5)
WBC: 6.1 10*3/uL (ref 4.0–10.5)

## 2017-12-18 LAB — HEPARIN LEVEL (UNFRACTIONATED): HEPARIN UNFRACTIONATED: 0.8 [IU]/mL — AB (ref 0.30–0.70)

## 2017-12-18 LAB — APTT: APTT: 70 s — AB (ref 24–36)

## 2017-12-18 LAB — PROTIME-INR
INR: 1.89
PROTHROMBIN TIME: 21.5 s — AB (ref 11.4–15.2)

## 2017-12-18 MED ORDER — MIDAZOLAM HCL 2 MG/2ML IJ SOLN
INTRAMUSCULAR | Status: AC | PRN
  Administered 2017-12-18: 2 mg via INTRAVENOUS

## 2017-12-18 MED ORDER — MIDAZOLAM HCL 2 MG/2ML IJ SOLN
INTRAMUSCULAR | Status: AC
  Administered 2017-12-18: 17:00:00
  Filled 2017-12-18: qty 2

## 2017-12-18 MED ORDER — LIDOCAINE HCL 1 % IJ SOLN
INTRAMUSCULAR | Status: AC
  Administered 2017-12-18: 17:00:00
  Filled 2017-12-18: qty 20

## 2017-12-18 MED ORDER — FENTANYL CITRATE (PF) 100 MCG/2ML IJ SOLN
INTRAMUSCULAR | Status: AC
  Administered 2017-12-18: 17:00:00
  Filled 2017-12-18: qty 2

## 2017-12-18 MED ORDER — FENTANYL CITRATE (PF) 100 MCG/2ML IJ SOLN
INTRAMUSCULAR | Status: AC | PRN
  Administered 2017-12-18: 100 ug via INTRAVENOUS

## 2017-12-18 MED ORDER — ARGATROBAN 50 MG/50ML IV SOLN
1.0000 ug/kg/min | INTRAVENOUS | Status: AC
  Administered 2017-12-18 – 2017-12-21 (×7): 1 ug/kg/min via INTRAVENOUS
  Filled 2017-12-18 (×8): qty 50

## 2017-12-18 NOTE — Progress Notes (Signed)
PROGRESS NOTE    Tony [name]  JAS:[id number] DOB: 1966-05-09 DOA: 12/17/2017 PCP: [name], MD   Brief Narrative:  Tony [name] is an 52 y.o. male who is s/p aortobifem bypass 11/15/17 by Dr. Scot Dock. 3 weeks later found to have occlusion of left limb of ABF which is unusual. Underwent limb thrombectomy limb, fem pop on other side on 6/22. Pt seemed to be doing ok and was discharged to SNF on Xarelto but came in to ED today with drainage from his L groin and L knee incision. Also c/o chills, neck swelling. He was admitted to vascular surgery, started on vanc and zosyn for fem-pop infection, and due to neck swelling, CT was done showing spiculated nodule in RUL, 16 cm. Concern for lung CA, metastatic nodes in neck and upper mediastinum. Argatroban ordered to begin 12 hours after his am xarelto dose; xarelto now on hold. Cannot get heparin 2/2 pork product allergy. Medicine consulted for new finding on CT which likely represents lung cancer with bulky lymphadenopathy.  Pt reports that he has lost about 40 lbs in a month. He has had night sweats as well. He reports anorexia during the past several weeks. He reports sharp R upper chest pain, worse with deep breathing and coughing as well as a several week h/o nonproductive cough. No hemoptysis. He quit smoking approximately 6 weeks ago. He does not drink alcohol.  Assessment & Plan:   Principal Problem:   Postoperative wound infection Active Problems:   Essential hypertension   Hyperlipidemia LDL goal <70   S/P aortobifemoral bypass surgery   Mass of upper lobe of right lung   Weight loss   Cervical lymphadenopathy   DVT (deep venous thrombosis) (HCC)   Spiculated Right Upper Lobe Nodule  Right cervical, bilateral supraclavicular, and upper mediastinal lymphadenopathy: Concerning for metastatic cancer S/p US guided bx of R neck mass Oncology consult  VTE  Right subclavian vein, right brachiocephalic, right IJ vein:  Likely  hypercoagulable 2/2 above Continue argatroban for now Will need life long anticoagulation Previously on xarelto which was started after recent vascular procedure   Appreciate oncology recommendations regarding anticoagulation  Peripheral Vascular Disease: s/p aortobifemoral bypass and R femoral to popliteal bypass on 11/15/17.  Developed critical limb ischemia of L leg and on 6/22 had thrombectomy L limb aortofemoral bypass, L femoral to above the knee popliteal bypass with non reversed great saphenous vein and tibial thrombectomy.    Post op wound infection:   Per vascular, currently on vanc/zosyn, may consider CT scan.  Afebrile  Anemia: chronic, stable, follow  DVT prophylaxis: argatroban Code Status: full  Family Communication: family at bedside Disposition Plan: pending improvement    Consultants:   Vascular  IR  Heme/onc  Procedures:   7/9 US guided right neck mass biopsy  Antimicrobials:  Anti-infectives (From admission, onward)   Start     Dose/Rate Route Frequency Ordered Stop   12/18/17 0300  vancomycin (VANCOCIN) IVPB 1000 mg/200 mL premix     1,000 mg 200 mL/hr over 60 Minutes Intravenous Every 12 hours 12/17/17 1354     12/17/17 2000  piperacillin-tazobactam (ZOSYN) IVPB 3.375 g     3.375 g 12.5 mL/hr over 240 Minutes Intravenous Every 8 hours 12/17/17 1354     12/17/17 1430  piperacillin-tazobactam (ZOSYN) IVPB 3.375 g     3.375 g 100 mL/hr over 30 Minutes Intravenous  Once 12/17/17 1351 12/17/17 1916   12/17/17 1430  vancomycin (VANCOCIN) 1,750 mg in sodium  chloride 0.9 % 500 mL IVPB     1,750 mg 250 mL/hr over 120 Minutes Intravenous  Once 12/17/17 1351 12/17/17 1747         Subjective: Feels ok. Neck sore.  Objective: Vitals:   12/18/17 1700 12/18/17 1715 12/18/17 1730 12/18/17 1750  BP: 140/66 140/62 129/60 125/62  Pulse:      Resp: 17 17 17 20   Temp: 99.6 F (37.6 C)     TempSrc: Oral     SpO2: 98%   97%  Weight:      Height:         Intake/Output Summary (Last 24 hours) at 12/18/2017 1815 Last data filed at 12/18/2017 1139 Gross per 24 hour  Intake 1769.91 ml  Output 725 ml  Net 1044.91 ml   Filed Weights   12/18/17 1613  Weight: 71.6 kg (157 lb 13.6 oz)    Examination:  General exam: Appears calm and comfortable  HEENT: tender LAD, most notable supraclavicular and to R neck Respiratory system: Clear to auscultation. Respiratory effort normal. Cardiovascular system: S1 & S2 heard, RRR.  Gastrointestinal system: Abdomen is nondistended, soft and nontender.  Central nervous system: Alert and oriented. No focal neurological deficits. Extremities: Symmetric 5 x 5 power. Skin:  Incisions to bilateral groin and medial L leg.  Minimal drainage noted today from LLE incision.  Black toes on R foot. Psychiatry: Judgement and insight appear normal. Mood & affect appropriate.     Data Reviewed: I have personally reviewed following labs and imaging studies  CBC: Recent Labs  Lab 12/17/17 1207 12/18/17 0054  WBC 6.9 6.1  NEUTROABS 5.2  --   HGB 9.0* 8.6*  HCT 29.2* 27.2*  MCV 92.1 91.6  PLT 404* 681   Basic Metabolic Panel: Recent Labs  Lab 12/17/17 1207 12/18/17 0054  NA 136 137  K 4.0 3.5  CL 101 102  CO2 27 27  GLUCOSE 112* 135*  BUN 13 10  CREATININE 0.98 0.94  CALCIUM 9.4 8.9   GFR: Estimated Creatinine Clearance: 96.3 mL/min (by C-G formula based on SCr of 0.94 mg/dL). Liver Function Tests: Recent Labs  Lab 12/17/17 1207  AST 21  ALT 20  ALKPHOS 127*  BILITOT 0.6  PROT 7.2  ALBUMIN 3.1*   No results for input(s): LIPASE, AMYLASE in the last 168 hours. No results for input(s): AMMONIA in the last 168 hours. Coagulation Profile: Recent Labs  Lab 12/17/17 1207 12/18/17 1003  INR 2.07 1.89   Cardiac Enzymes: No results for input(s): CKTOTAL, CKMB, CKMBINDEX, TROPONINI in the last 168 hours. BNP (last 3 results) No results for input(s): PROBNP in the last 8760  hours. HbA1C: No results for input(s): HGBA1C in the last 72 hours. CBG: Recent Labs  Lab 12/17/17 1646  GLUCAP 90   Lipid Profile: No results for input(s): CHOL, HDL, LDLCALC, TRIG, CHOLHDL, LDLDIRECT in the last 72 hours. Thyroid Function Tests: No results for input(s): TSH, T4TOTAL, FREET4, T3FREE, THYROIDAB in the last 72 hours. Anemia Panel: No results for input(s): VITAMINB12, FOLATE, FERRITIN, TIBC, IRON, RETICCTPCT in the last 72 hours. Sepsis Labs: Recent Labs  Lab 12/17/17 1214  LATICACIDVEN 0.84    No results found for this or any previous visit (from the past 240 hour(s)).       Radiology Studies: Ct Soft Tissue Neck W Contrast  Result Date: 12/17/2017 CLINICAL DATA:  52 y/o M; recently removed right neck PICC line with swelling and pain in the region of PICC line. EXAM:  CT NECK WITH CONTRAST TECHNIQUE: Multidetector CT imaging of the neck was performed using the standard protocol following the bolus administration of intravenous contrast. CONTRAST:  100 cc Omnipaque 300 COMPARISON:  None. FINDINGS: Pharynx and larynx: Normal. No mass or swelling. Salivary glands: No inflammation, mass, or stone. Thyroid: Normal. Lymph nodes: Right cervical, bilateral supraclavicular, and upper mediastinal bulky lymphadenopathy. A right upper paratracheal lymph node measures 5.0 x 3.5 cm (series 3, image 109), left supraclavicular node measures 3.1 x 1.7 cm (series 3, image 85), and a right supraclavicular node measures 1.6 x 2.1 cm (series 3, image 68). Vascular: Deep venous thrombosis involving right subclavian vein, right brachiocephalic vein, and right internal jugular vein. Limited intracranial: Negative. Visualized orbits: Negative. Mastoids and visualized paranasal sinuses: Small right maxillary sinus mucous retention cyst. Skeleton: No acute or aggressive process. Upper chest: Spiculated nodule in the right upper lobe measuring 16 x 12 mm (series 3, image 115). Other: Mild edema in  the right supraclavicular fossa and lower neck. IMPRESSION: 1. Deep venous thrombosis involving the right subclavian vein, right brachiocephalic vein, and right internal jugular vein. 2. Edema in the right lower neck and supraclavicular fossa is probably related to venous congestion from DVT and/or phlebitis. No abscess. 3. Severe bulky lymphadenopathy of right cervical, bilateral supraclavicular, and upper mediastinal lymph nodes likely representing metastatic disease or lymphoproliferative disease. 4. Spiculated right upper lobe nodule measuring up to 16 mm. Consider one of the following in 3 months for both low-risk and high-risk individuals: (a) repeat chest CT, (b) follow-up PET-CT, or (c) tissue sampling. This recommendation follows the consensus statement: Guidelines for Management of Incidental Pulmonary Nodules Detected on CT Images: From the Fleischner Society 2017; Radiology 2017; 284:228-243. These results will be called to the ordering clinician or representative by the Radiologist Assistant, and communication documented in the PACS or zVision Dashboard. Electronically Signed   By: Kristine Garbe M.D.   On: 12/17/2017 15:04   Ir US Guide Bx Asp/drain  Result Date: 12/18/2017 INDICATION: 52 year old male with a history of neck mass EXAM: IR ULTRASOUND GUIDANCE MEDICATIONS: None. ANESTHESIA/SEDATION: Moderate (conscious) sedation was employed during this procedure. A total of Versed 2.0 mg and Fentanyl 100 mcg was administered intravenously. Moderate Sedation Time: 12 minutes. The patient's level of consciousness and vital signs were monitored continuously by radiology nursing throughout the procedure under my direct supervision. FLUOROSCOPY TIME:  None COMPLICATIONS: None PROCEDURE: Informed written consent was obtained from the patient after a thorough discussion of the procedural risks, benefits and alternatives. All questions were addressed. Maximal Sterile Barrier Technique was utilized  including caps, mask, sterile gowns, sterile gloves, sterile drape, hand hygiene and skin antiseptic. A timeout was performed prior to the initiation of the procedure. Patient positioned supine position on the ultrasound table. Ultrasound images of the right neck were performed with images stored sent to PACs. Patient is then prepped and draped in the usual sterile fashion. 1% lidocaine was used for local anesthesia. Using ultrasound guidance, multiple 18 gauge core biopsy performed of right neck mass. Tissue specimen placed in the saline. Sterile bandage was placed. Patient tolerated the procedure well and remained hemodynamically stable throughout. No complications were encountered and no significant blood loss. IMPRESSION: Status post ultrasound-guided biopsy of right neck mass. Signed, Dulcy Fanny. Dellia Nims, RPVI [facility] Radiology Electronically Signed   By: Corrie Mckusick D.O.   On: 12/18/2017 17:00        Scheduled Meds: . atorvastatin  40  mg Oral Daily  . gabapentin  200 mg Oral BID  . pantoprazole  40 mg Oral Daily  . sodium chloride flush  3 mL Intravenous Q12H   Continuous Infusions: . sodium chloride    . argatroban    . piperacillin-tazobactam (ZOSYN)  IV 3.375 g (12/18/17 1139)  . vancomycin 1,000 mg (12/18/17 1716)     LOS: 1 day    Time spent: over 70 min    Fayrene Helper, MD [facility] Pager [phone]  If 7PM-7AM, please contact night-coverage [email or web address] Password [email or web address] 12/18/2017, 6:15 PM

## 2017-12-18 NOTE — Progress Notes (Signed)
ANTICOAGULATION CONSULT NOTE - [place] for Argatroban while Xarelto on hold Indication: DVT  Patient Measurements:    Wt: 81.6 kg Ht: 6'1" Heparin Dosing Weight: n/a  Vital Signs: Temp: 98.7 F (37.1 C) (07/08 2107) Temp Source: Oral (07/08 2107) BP: 132/60 (07/08 2041) Pulse Rate: 87 (07/08 2041)  Labs: Recent Labs    12/17/17 1207 12/17/17 1633 12/17/17 2241 12/18/17 0054  HGB 9.0*  --   --  8.6*  HCT 29.2*  --   --  27.2*  PLT 404*  --   --  383  APTT 38* 37* 65* 70*  LABPROT 23.1*  --   --   --   INR 2.07  --   --   --   HEPARINUNFRC  --  >2.20*  --  0.80*  CREATININE 0.98  --   --  0.94    Estimated Creatinine Clearance: 107.4 mL/min (by C-G formula based on SCr of 0.94 mg/dL).   Medications:  Scheduled:  . atorvastatin  40 mg Oral Daily  . gabapentin  200 mg Oral BID  . pantoprazole  40 mg Oral Daily  . potassium chloride  20-40 mEq Oral Once  . sodium chloride flush  3 mL Intravenous Q12H    Assessment: 52 yo male s/p thrombectomy last admission, discharged on chronic Xarelto.  Last dose this AM at 0830.  Now admitted with wound infection.  Pharmacy consulted for IV anticoagulation with argatroban, given patient refusing any pork-derived products.  aPTT this AM is therapeutic x 2  Goal of Therapy:  aPTT 50-90 seconds Monitor platelets by anticoagulation protocol: Yes   Plan:  -Continue Argatroban 1 mcg/kg/min -Daily aPTT/CBC  Narda Bonds, PharmD, BCPS Clinical Pharmacist Phone: [phone]

## 2017-12-18 NOTE — Procedures (Signed)
Interventional Radiology Procedure Note  Procedure: US guided right neck mass biopsy.  Mx 18g core biopsy. Complications: None Recommendations:  - Follow up pathology - ok to restart argatroban in 1 hr - sterile dressing x 24 hours, then change as needed - Do not submerge for 7 days - Routine care   Signed,  Dulcy Fanny. Earleen Newport, DO

## 2017-12-18 NOTE — H&P (Signed)
Chief Complaint: Right neck mass  Referring Physician(s): Janora Norlander  Supervising Physician: Corrie Mckusick  Patient Status: [facility] - In-pt  History of Present Illness: Tony [name] is a 52 y.o. male who is s/p aortobifem bypass 11/15/17 by Dr. Scot Dock then 3 weeks later he was found to have occlusion of left limb of ABF.  He underwent thrombectomy of the left limb and a left femoral to popliteal bypass on 6/22.   He was discharged on 12/05/2017 to a SNF on Xarelto.  He came back to the ED yesterday with drainage from his L groin and L knee incision.   Also c/o chills and neck swelling.   He was admitted to vascular surgery, started on vanc and zosyn for fem-pop infection.  Work up of neck swelling included CT scan which showed spiculated nodule in RUL, 16 cm and concern for lung CA, metastatic nodes in neck and upper mediastinum.   Xarelto is now on hold and he is on Argatroban.  We are asked to perform an image guided biopsy of the neck mass.  He is NPO.   Past Medical History:  Diagnosis Date  . Anxiety   . Arterial occlusive disease    multilevel arterial occlusive disease/notes 11/13/2017  . Arthritis    "left knee" (11/13/2017)  . Chronic back pain   . Chronic lower back pain   . Depression   . Gastric ulcer due to Helicobacter pylori 4765  . GERD (gastroesophageal reflux disease)   . High cholesterol   . Hypertension   . IBS (irritable bowel syndrome)   . Mesenteric adenitis 2011   presumed/H&P  . Migraines    "use to suffer migraines years ago" (11/13/2017)  . Peripheral vascular disease ([place])   . Pneumonia 2011  . Ulcer     Past Surgical History:  Procedure Laterality Date  . AORTA - BILATERAL FEMORAL ARTERY BYPASS GRAFT Bilateral 11/15/2017   Procedure: AORTA BIFEMORAL BYPASS GRAFT;  Surgeon: Angelia Mould, MD;  Location: [place];  Service: Vascular;  Laterality: Bilateral;  . BACK SURGERY  X 4   "procedure where they burn the nerves  every 6 months"  . EMBOLECTOMY Left 12/01/2017   Procedure: THROMBECTOMY OF LEFT AORTAFEMORAL BYPASS, THROMBECTOMY OF TIBIAL ARTERY;  Surgeon: Rosetta Posner, MD;  Location[facility] OR;  Service: Vascular;  Laterality: Left;  . ESOPHAGOGASTRODUODENOSCOPY  08/22/2011   Procedure: ESOPHAGOGASTRODUODENOSCOPY (EGD);  Surgeon: [name], MD;  Location: [place];  Service: Gastroenterology;  Laterality: N/A;  . FEMORAL-POPLITEAL BYPASS GRAFT Right 11/15/2017   Procedure: Right FEMORAL-Above knee POPLITEAL ARTERY Bypass Graft using nonreversed saphenous vein ;  Surgeon: Angelia Mould, MD;  Location: [place];  Service: Vascular;  Laterality: Right;  . FEMORAL-POPLITEAL BYPASS GRAFT Left 12/01/2017   Procedure: BYPASS GRAFT FEMORAL- ABOVE KNEE POPLITEAL ARTERY WITH VEIN;  Surgeon: Rosetta Posner, MD;  Location: [facility];  Service: Vascular;  Laterality: Left;  . INTRAOPERATIVE ARTERIOGRAM Right 11/15/2017   Procedure: INTRA OPERATIVE ARTERIOGRAM;  Surgeon: Angelia Mould, MD;  Location: [place];  Service: Vascular;  Laterality: Right;  . INTRAOPERATIVE ARTERIOGRAM Left 12/01/2017   Procedure: INTRA OPERATIVE ARTERIOGRAM OF LEFT LEG;  Surgeon: Rosetta Posner, MD;  Location: [place];  Service: Vascular;  Laterality: Left;  . KNEE ARTHROSCOPY Left X 5    Allergies: Pork-derived products; Shellfish-derived products; and Morphine and related  Medications: Prior to Admission medications   Medication Sig Start Date End Date Taking? Authorizing  Provider  Amino Acids-Protein Hydrolys (FEEDING SUPPLEMENT, PRO-STAT SUGAR FREE 64,) LIQD Take 30 mLs by mouth daily.   Yes [provider]  lactulose (CHRONULAC) 10 GM/15ML solution Take 10 g by mouth daily.   Yes [provider]  oxyCODONE (OXY IR/ROXICODONE) 5 MG immediate release tablet Take 10 mg by mouth 4 (four) times daily.   Yes [provider]  oxyCODONE-acetaminophen (PERCOCET/ROXICET) 5-325 MG tablet Take 1-2 tablets by mouth  every 6 (six) hours as needed for severe pain. Patient taking differently: Take 2 tablets by mouth every 4 (four) hours as needed for severe pain.  12/05/17  Yes Dagoberto Ligas, PA-C  oxyCODONE-acetaminophen (PERCOCET/ROXICET) 5-325 MG tablet Take 1 tablet by mouth every 4 (four) hours as needed for severe pain.   Yes [provider]  polyethylene glycol (MIRALAX / GLYCOLAX) packet Take 17 g by mouth daily as needed for mild constipation.   Yes [provider]  Rivaroxaban (XARELTO) 15 MG TABS tablet Take 1 tablet (15 mg total) by mouth 2 (two) times daily with a meal. 12/05/17  Yes Dagoberto Ligas, PA-C  sennosides-docusate sodium (SENOKOT-S) 8.6-50 MG tablet Take 2 tablets by mouth at bedtime.   Yes [provider]  valsartan (DIOVAN) 160 MG tablet Take 160 mg by mouth daily. 11/09/17  Yes [provider]  vitamin C (ASCORBIC ACID) 500 MG tablet Take 500 mg by mouth 2 (two) times daily.   Yes [provider]  atorvastatin (LIPITOR) 40 MG tablet Take 1 tablet (40 mg total) by mouth daily. 11/26/17   Ulyses Amor, PA-C  gabapentin (NEURONTIN) 100 MG capsule Take 2 capsules (200 mg total) by mouth 2 (two) times daily. Patient not taking: Reported on 12/17/2017 11/11/17   Ulyses Amor, PA-C  gabapentin (NEURONTIN) 300 MG capsule Take 1 capsule (300 mg total) by mouth 3 (three) times daily. Patient not taking: Reported on 12/01/2017 11/26/17   Ulyses Amor, PA-C  hydrochlorothiazide (HYDRODIURIL) 25 MG tablet Take 25 mg by mouth daily.    [provider]  LORazepam (ATIVAN) 1 MG tablet Take 1 mg by mouth every 8 (eight) hours as needed for anxiety.    [provider]  meloxicam (MOBIC) 15 MG tablet Take 15 mg by mouth daily as needed for pain.    [provider]  metoprolol succinate (TOPROL-XL) 100 MG 24 hr tablet Take 100 mg by mouth at bedtime. Take with or immediately following a meal.    [provider]  rivaroxaban  (XARELTO) 20 MG TABS tablet Take 1 tablet (20 mg total) by mouth daily with supper. 12/24/17   Dagoberto Ligas, PA-C     Family History  Problem Relation Age of Onset  . Hypertension Mother   . Hypertension Father   . Coronary artery disease Father 65  . Heart attack Brother 26  . Coronary artery disease Brother   . Diabetes Neg Hx   . Stroke Neg Hx     Social History   Socioeconomic History  . Marital status: Married    Spouse name: Not on file  . Number of children: 3  . Years of education: Not on file  . Highest education level: Not on file  Occupational History  . Occupation: Admin. Assistant    Employer: [employer]  Social Needs  . Financial resource strain: Not on file  . Food insecurity:    Worry: Not on file    Inability: Not on file  . Transportation needs:  Medical: Not on file    Non-medical: Not on file  Tobacco Use  . Smoking status: Former Smoker    Packs/day: 1.00    Years: 24.00    Pack years: 24.00    Types: Cigarettes    Start date: 11/28/2017    Last attempt to quit: 12/04/2017    Years since quitting: 0.0  . Smokeless tobacco: Never Used  Substance and Sexual Activity  . Alcohol use: Yes    Comment: 11/13/2017 "did drink wine q  1-2 months; nothing anymore"   . Drug use: Not Currently    Types: Marijuana    Comment: 11/13/2017  "last drug use was in ~ 1987 "  . Sexual activity: Not on file  Lifestyle  . Physical activity:    Days per week: Not on file    Minutes per session: Not on file  . Stress: Not on file  Relationships  . Social connections:    Talks on phone: Not on file    Gets together: Not on file    Attends religious service: Not on file    Active member of club or organization: Not on file    Attends meetings of clubs or organizations: Not on file    Relationship status: Not on file  Other Topics Concern  . Not on file  Social History Narrative   Lives with wife and two children.  Administrator at [employer]: A 12 point ROS discussed and pertinent positives are indicated in the HPI above.  All other systems are negative. Review of Systems  Constitutional: Positive for activity change, chills, fatigue and unexpected weight change.  HENT: Negative.   Respiratory: Positive for cough.   Cardiovascular: Positive for chest pain.  Gastrointestinal: Negative.   Genitourinary: Negative.   Musculoskeletal: Negative.     Vital Signs: BP 130/70 (BP Location: Right Arm)   Pulse 89   Temp 98.3 F (36.8 C) (Oral)   Resp 16   SpO2 98%   Physical Exam  Constitutional: He is oriented to person, place, and time. He appears well-developed.  HENT:  Head: Normocephalic and atraumatic.  Eyes: EOM are normal.  Neck: Normal range of motion.  Cardiovascular: Normal rate, regular rhythm and normal heart sounds.  Pulmonary/Chest: Effort normal and breath sounds normal.  Musculoskeletal: Normal range of motion.  Neurological: He is alert and oriented to person, place, and time.  Skin: Skin is warm and dry.  Psychiatric: He has a normal mood and affect. His behavior is normal. Judgment and thought content normal.  Vitals reviewed.    Imaging: Ct Soft Tissue Neck W Contrast  Result Date: 12/17/2017 CLINICAL DATA:  52 y/o M; recently removed right neck PICC line with swelling and pain in the region of PICC line. EXAM: CT NECK WITH CONTRAST TECHNIQUE: Multidetector CT imaging of the neck was performed using the standard protocol following the bolus administration of intravenous contrast. CONTRAST:  100 cc Omnipaque 300 COMPARISON:  None. FINDINGS: Pharynx and larynx: Normal. No mass or swelling. Salivary glands: No inflammation, mass, or stone. Thyroid: Normal. Lymph nodes: Right cervical, bilateral supraclavicular, and upper mediastinal bulky lymphadenopathy. A right upper paratracheal lymph node measures 5.0 x 3.5 cm (series 3, image 109), left supraclavicular node measures 3.1 x 1.7 cm (series 3, image  85), and a right supraclavicular node measures 1.6 x 2.1 cm (series 3, image 68). Vascular: Deep venous thrombosis involving right subclavian vein, right brachiocephalic vein, and right internal  jugular vein. Limited intracranial: Negative. Visualized orbits: Negative. Mastoids and visualized paranasal sinuses: Small right maxillary sinus mucous retention cyst. Skeleton: No acute or aggressive process. Upper chest: Spiculated nodule in the right upper lobe measuring 16 x 12 mm (series 3, image 115). Other: Mild edema in the right supraclavicular fossa and lower neck. IMPRESSION: 1. Deep venous thrombosis involving the right subclavian vein, right brachiocephalic vein, and right internal jugular vein. 2. Edema in the right lower neck and supraclavicular fossa is probably related to venous congestion from DVT and/or phlebitis. No abscess. 3. Severe bulky lymphadenopathy of right cervical, bilateral supraclavicular, and upper mediastinal lymph nodes likely representing metastatic disease or lymphoproliferative disease. 4. Spiculated right upper lobe nodule measuring up to 16 mm. Consider one of the following in 3 months for both low-risk and high-risk individuals: (a) repeat chest CT, (b) follow-up PET-CT, or (c) tissue sampling. This recommendation follows the consensus statement: Guidelines for Management of Incidental Pulmonary Nodules Detected on CT Images: From the Fleischner Society 2017; Radiology 2017; 284:228-243. These results will be called to the ordering clinician or representative by the Radiologist Assistant, and communication documented in the PACS or zVision Dashboard. Electronically Signed   By: Kristine Garbe M.D.   On: 12/17/2017 15:04   Dg Ang/ext/uni/or Left  Result Date: 12/01/2017 CLINICAL DATA:  Intraoperative arteriogram of the left lower leg EXAM: LEFT ANG/EXT/UNI/ OR FLUOROSCOPY TIME:  Not provided COMPARISON:  None FINDINGS: A single spot intraoperative radiographic image of  the left knee provided for review Intra-arterial contrast has been injected from a more proximal location. Images demonstrate the sequela of presumably reverse venous distal femoral bypass grafting. The distal anastomosis appears widely patent without hemodynamically significant stenosis. The distal most aspect of the left superficial femoral artery as well as the left above and below knee popliteal artery appear widely patent. There are mixed occlusive and nonocclusive filling defect within the tibioperoneal trunk with an apparent single vessel runoff to the left lower leg via the posterior tibial artery. Skin staples are noted about the medial aspect of the left lower leg with associated scattered foci of subcutaneous. IMPRESSION: Distal anastomosis presumably reverse venous distal femoral bypass grafting appears widely patent, however there are mixed occlusive and nonocclusive filling defects with tibioperoneal trunk and apparent single-vessel runoff to the left lower leg via the posterior tibial artery. Electronically Signed   By: Sandi Mariscal M.D.   On: 12/01/2017 14:18    Labs:  CBC: Recent Labs    12/02/17 0050 12/05/17 0521 12/17/17 1207 12/18/17 0054  WBC 8.4 7.6 6.9 6.1  HGB 8.1* 7.9* 9.0* 8.6*  HCT 26.2* 25.3* 29.2* 27.2*  PLT 314 378 404* 383    COAGS: Recent Labs    11/11/17 0025 11/15/17 1638 12/17/17 1207 12/17/17 1633 12/17/17 2241 12/18/17 0054  INR 1.07 1.01 2.07  --   --   --   APTT 28 29 38* 37* 65* 70*    BMP: Recent Labs    12/02/17 0050 12/05/17 0521 12/17/17 1207 12/18/17 0054  NA 137 137 136 137  K 3.9 3.9 4.0 3.5  CL 104 99 101 102  CO2 24 30 27 27   GLUCOSE 138* 118* 112* 135*  BUN 10 7 13 10   CALCIUM 8.4* 9.0 9.4 8.9  CREATININE 0.94 1.00 0.98 0.94  GFRNONAA >60 >60 >60 >60  GFRAA >60 >60 >60 >60    LIVER FUNCTION TESTS: Recent Labs    11/16/17 0415 12/17/17 1207  BILITOT 0.6 0.6  AST 26 21  ALT 21 20  ALKPHOS 78 127*  PROT 5.4* 7.2   ALBUMIN 3.0* 3.1*    TUMOR MARKERS: No results for input(s): AFPTM, CEA, CA199, CHROMGRNA in the last 8760 hours.  Assessment and Plan:  Right neck mass = worrisome for metastatic lung cancer.  Will proceed with image guided biopsy today.  Order written to stop Argatroban drip now (10:20).  Risks and benefits discussed with the patient including, but not limited to bleeding, infection, damage to adjacent structures or low yield requiring additional tests.  All of the patient's questions were answered, patient is agreeable to proceed. Consent signed and in chart.  Thank you for this interesting consult.  I greatly enjoyed meeting Tony [name] and look forward to participating in their care.  A copy of this report was sent to the requesting provider on this date.  Electronically Signed: Murrell Redden, PA-C   12/18/2017, 10:27 AM      I spent a total of 40 Minutes in face to face in clinical consultation, greater than 50% of which was counseling/coordinating care for biopsy neck mass.

## 2017-12-18 NOTE — Progress Notes (Addendum)
  Progress Note  Subjective:  No complaints this morning  afebrile  Vitals:   12/17/17 2107 12/18/17 0403  BP:  140/76  Pulse:  89  Resp:  18  Temp: 98.7 F (37.1 C) 98.4 F (36.9 C)  SpO2:  98%   Physical Exam: General:  No distress Lungs:  Non labored Incisions:  Right groin with small area of fluctuance (unchanged per Dr. Scot Dock) and no erythema.  Left groin with mild separation of mid portion of incision that is superficial.  Some serous drainage from BK incision left leg with mild erythema Extremities:  Toes right foot continue to demarcate  CBC    Component Value Date/Time   WBC 6.1 12/18/2017 0054   RBC 2.97 (L) 12/18/2017 0054   HGB 8.6 (L) 12/18/2017 0054   HCT 27.2 (L) 12/18/2017 0054   PLT 383 12/18/2017 0054   MCV 91.6 12/18/2017 0054   MCH 29.0 12/18/2017 0054   MCHC 31.6 12/18/2017 0054   RDW 12.7 12/18/2017 0054   LYMPHSABS 0.8 12/17/2017 1207   MONOABS 0.6 12/17/2017 1207   EOSABS 0.2 12/17/2017 1207   BASOSABS 0.1 12/17/2017 1207    BMET    Component Value Date/Time   NA 137 12/18/2017 0054   K 3.5 12/18/2017 0054   CL 102 12/18/2017 0054   CO2 27 12/18/2017 0054   GLUCOSE 135 (H) 12/18/2017 0054   BUN 10 12/18/2017 0054   CREATININE 0.94 12/18/2017 0054   CALCIUM 8.9 12/18/2017 0054   GFRNONAA >60 12/18/2017 0054   GFRAA >60 12/18/2017 0054   INR    Component Value Date/Time   INR 2.07 12/17/2017 1207    Intake/Output Summary (Last 24 hours) at 12/18/2017 0755 Last data filed at 12/18/2017 0438 Gross per 24 hour  Intake 1577.11 ml  Output 725 ml  Net 852.11 ml   Assessment:  52 y.o. male is s/p:  status post aortobifemoral bypass and right femoral to popliteal bypass by Dr. Scot Dock on 11/15/2017.Work-up in the emergency department included a CTA which demonstrated acute thrombosis of left limb of ABF bypass.He was taken emergently to the operating room by Dr. early on 12/01/2017 and underwent thrombectomy of the left limb aVF bypass  with left femoral to above-the-knee popliteal artery bypass with vein and tibial thrombectomy  Plan: -pt readmitted yesterday for IV abx for drainage from groin (left) -continue IV abx.  Dr. Scot Dock probed the left groin and it appears superficial.  Right groin unchanged.  Toes continue to demarcate on right foot.  If he continues to have drainage, may need CT scan to evaluate-will continue conservative tx at this time while cancer workup in progress -CT scan of the neck/chest revealed 55mm spiculated mass RUL and cervical lymphadenopathy suspicious for metastatic disease.  TRH consulted and with takeover primary care today-appreciate assistance.  Heme/onc consult obtained and he is scheduled for LN bx today. -DVT prophylaxis:  Heparin gtt for DVT  Leontine Locket, PA-C [facility] [phone] 12/18/2017 7:55 AM  I have interviewed the patient and examined the patient. I agree with the findings by the PA. If he is found to have a lung cancer, this might help explain why he seems to be hypercoagulable.  We will continue to follow his right toes and his incisions closely.  Gae Gallop, MD [phone]

## 2017-12-18 NOTE — Progress Notes (Signed)
Oncology short note  Oncology consult received for concern for metastatic lung cancer with malignancy associated/post surgical DVT -on anticoagulation. -pending FNA of cervical LN for tissue diagnosis. -on active anticoagulation -patient will be seen later today  Sullivan Lone MD MS

## 2017-12-18 NOTE — Progress Notes (Signed)
Argatroban drip stopped per Interventional Radiology request.

## 2017-12-18 NOTE — Plan of Care (Signed)
Care plans reviewed and patient is progressing.  

## 2017-12-19 ENCOUNTER — Encounter: Payer: BLUE CROSS/BLUE SHIELD | Admitting: Vascular Surgery

## 2017-12-19 DIAGNOSIS — I739 Peripheral vascular disease, unspecified: Secondary | ICD-10-CM | POA: Diagnosis present

## 2017-12-19 DIAGNOSIS — C801 Malignant (primary) neoplasm, unspecified: Secondary | ICD-10-CM

## 2017-12-19 LAB — VANCOMYCIN, TROUGH: Vancomycin Tr: 23 ug/mL (ref 15–20)

## 2017-12-19 LAB — FERRITIN: Ferritin: 698 ng/mL — ABNORMAL HIGH (ref 24–336)

## 2017-12-19 LAB — BASIC METABOLIC PANEL
Anion gap: 13 (ref 5–15)
BUN: 9 mg/dL (ref 6–20)
CALCIUM: 9 mg/dL (ref 8.9–10.3)
CO2: 25 mmol/L (ref 22–32)
CREATININE: 1.33 mg/dL — AB (ref 0.61–1.24)
Chloride: 99 mmol/L (ref 98–111)
GFR calc Af Amer: >60 mL/min (ref >60)
GLUCOSE: 120 mg/dL — AB (ref 70–99)
Potassium: 3.6 mmol/L (ref 3.5–5.1)
SODIUM: 137 mmol/L (ref 135–145)

## 2017-12-19 LAB — CBC
HCT: 26.2 % — ABNORMAL LOW (ref 39.0–52.0)
Hemoglobin: 8.3 g/dL — ABNORMAL LOW (ref 13.0–17.0)
MCH: 29 pg (ref 26.0–34.0)
MCHC: 31.7 g/dL (ref 30.0–36.0)
MCV: 91.6 fL (ref 78.0–100.0)
PLATELETS: 386 10*3/uL (ref 150–400)
RBC: 2.86 MIL/uL — ABNORMAL LOW (ref 4.22–5.81)
RDW: 12.9 % (ref 11.5–15.5)
WBC: 7.7 10*3/uL (ref 4.0–10.5)

## 2017-12-19 LAB — IRON AND TIBC
Iron: 16 ug/dL — ABNORMAL LOW (ref 45–182)
Saturation Ratios: 8 % — ABNORMAL LOW (ref 17.9–39.5)
TIBC: 190 ug/dL — ABNORMAL LOW (ref 250–450)
UIBC: 174 ug/dL

## 2017-12-19 LAB — APTT: aPTT: 61 seconds — ABNORMAL HIGH (ref 24–36)

## 2017-12-19 LAB — MAGNESIUM: MAGNESIUM: 2 mg/dL (ref 1.7–2.4)

## 2017-12-19 LAB — LACTATE DEHYDROGENASE: LDH: 429 U/L — AB (ref 98–192)

## 2017-12-19 MED ORDER — ESCITALOPRAM OXALATE 20 MG PO TABS
20.0000 mg | ORAL_TABLET | Freq: Every day | ORAL | Status: DC
  Administered 2017-12-19 – 2017-12-29 (×11): 20 mg via ORAL
  Filled 2017-12-19 (×11): qty 1

## 2017-12-19 MED ORDER — VANCOMYCIN HCL 500 MG IV SOLR
500.0000 mg | Freq: Two times a day (BID) | INTRAVENOUS | Status: DC
  Administered 2017-12-20: 500 mg via INTRAVENOUS
  Filled 2017-12-19 (×3): qty 500

## 2017-12-19 NOTE — Progress Notes (Addendum)
  Progress Note  Subjective:  No new complaints  Vitals:   12/18/17 1944 12/19/17 0536  BP: (!) 146/73 124/67  Pulse: 100 80  Resp: 20 18  Temp: 99.2 F (37.3 C) 98.7 F (37.1 C)  SpO2: 97% 96%   Physical Exam: Lungs:  Non labored Incisions:  R groin some fluctuance but no dehiscence; L groin no drainage with manipulation of incision; L popliteal with some yellowish collection on dressing but no active drainage Extremities:  Feet symmetrically warm to touch; gangrenous toes of R foot Abdomen:  Soft Neurologic: A&O  CBC    Component Value Date/Time   WBC 7.7 12/19/2017 0424   RBC 2.86 (L) 12/19/2017 0424   HGB 8.3 (L) 12/19/2017 0424   HCT 26.2 (L) 12/19/2017 0424   PLT 386 12/19/2017 0424   MCV 91.6 12/19/2017 0424   MCH 29.0 12/19/2017 0424   MCHC 31.7 12/19/2017 0424   RDW 12.9 12/19/2017 0424   LYMPHSABS 0.8 12/17/2017 1207   MONOABS 0.6 12/17/2017 1207   EOSABS 0.2 12/17/2017 1207   BASOSABS 0.1 12/17/2017 1207   BMET    Component Value Date/Time   NA 137 12/19/2017 0424   K 3.6 12/19/2017 0424   CL 99 12/19/2017 0424   CO2 25 12/19/2017 0424   GLUCOSE 120 (H) 12/19/2017 0424   BUN 9 12/19/2017 0424   CREATININE 1.33 (H) 12/19/2017 0424   CALCIUM 9.0 12/19/2017 0424   GFRNONAA >60 12/19/2017 0424   GFRAA >60 12/19/2017 0424   INR    Component Value Date/Time   INR 1.89 12/18/2017 1003   Assessment/Plan:  52 y.o. male is s/p ABF and R fem-pop with subsequent thrombectomy L ABF limb and L fem pop   - Continue IV antibiotics - Ordered dry dressing changes L groin and popliteal incisions; no drainage with manipulation of L groin on exam - Feet warm to touch with good cap refill; no indication for surgical intervention gangrenous toes of R foot at this time - s/p R neck mass biopsy; Oncology to evaluate the patient today  DVT prophylaxis:  argatroban  Dagoberto Ligas, PA-C [facility] [phone] 12/19/2017 7:50 AM  I have  interviewed the patient and examined the patient. I agree with the findings by the PA. There is been no change in his exam.  His cancer work-up is in progress.  Gae Gallop, MD [phone]

## 2017-12-19 NOTE — Progress Notes (Signed)
Dry dressings replaced on groin and leg incisions.

## 2017-12-19 NOTE — Progress Notes (Signed)
ANTICOAGULATION CONSULT NOTE - Follow-Up Consult  Pharmacy Consult for Argatroban while Xarelto on hold Indication: DVT  Patient Measurements: Height: 6\' 5"  (195.6 cm) Weight: 157 lb 13.6 oz (71.6 kg) IBW/kg (Calculated) : 89.1  Wt: 81.6 kg Ht: 6'1" Heparin Dosing Weight: n/a  Vital Signs: Temp: 98.7 F (37.1 C) (07/10 0536) Temp Source: Oral (07/10 0536) BP: 124/67 (07/10 0536) Pulse Rate: 80 (07/10 0536)  Labs: Recent Labs    12/17/17 1207 12/17/17 1633 12/17/17 2241 12/18/17 0054 12/18/17 1003 12/19/17 0424  HGB 9.0*  --   --  8.6*  --  8.3*  HCT 29.2*  --   --  27.2*  --  26.2*  PLT 404*  --   --  383  --  386  APTT 38* 37* 65* 70*  --  61*  LABPROT 23.1*  --   --   --  21.5*  --   INR 2.07  --   --   --  1.89  --   HEPARINUNFRC  --  >2.20*  --  0.80*  --   --   CREATININE 0.98  --   --  0.94  --  1.33*    Estimated Creatinine Clearance: 68 mL/min (A) (by C-G formula based on SCr of 1.33 mg/dL (H)).   Medications:  Scheduled:  . atorvastatin  40 mg Oral Daily  . gabapentin  200 mg Oral BID  . pantoprazole  40 mg Oral Daily  . sodium chloride flush  3 mL Intravenous Q12H    Assessment: 52 yo male s/p thrombectomy last admission, discharged on chronic Xarelto.  Last dose this AM at 0830.  Now admitted with wound infection.  Pharmacy consulted for IV anticoagulation with argatroban, given patient refusing any pork-derived products. Argatroban resumed (7/9) @ 1800, status post lymph node biopsy.   APTT remains therapeutic this AM. CBC stable. H&H slight drift downwards. No bleeding documented.   Goal of Therapy:  aPTT 50-90 seconds Monitor platelets by anticoagulation protocol: Yes   Plan:  -Continue Argatroban 1 mcg/kg/min -Daily aPTT/CBC  Tamela Gammon, PharmD PGY1 Pharmacy Resident Direct Phone: [phone] 12/19/2017  8:36 AM

## 2017-12-19 NOTE — Progress Notes (Addendum)
  PROGRESS NOTE  AJAI HARVILLE LKT:[id number] DOB: 16-Jan-1966 DOA: 12/17/2017 PCP: [name], MD  Brief Narrative: 52 year old man PMH critical limb ischemia, status post aortobifemoral bypass, right femoral artery to popliteal bypass, status post thrombectomy left limb aVF bypass.  Discharged on Xarelto.  Presented 7/8 with drainage from left groin wound and left below-knee incision.  Admitted by vascular surgery and started on IV antibiotics.  Because of neck pain, further imaging was obtained which revealed spiculated lung mass concerning for malignancy, patient transferred to hospitalist service.  Assessment/Plan Left groin postop wound infection  --Management per vascular surgery.  Continue empiric antibiotics.  Peripheral vascular disease with right foot gangrene.   --Per vascular surgery   Right upper lobe spiculated nodule with associated lymphadenopathy concerning for primary lung cancer vs lymphoma, with associated hypercoagulable state. --Status post biopsy 7/8.  Follow-up pathology. --Further management per oncology, consultation appreciated  Venous thrombosis right subclavian vein, right brachiocephalic vein, right internal jugular vein --Continue agatroban for now.  Further recommendations per oncology given failure of Xarelto.  DVT prophylaxis: agatroban Code Status: Full Family Communication: brother at bedside Disposition Plan: home    Murray Hodgkins, MD  [facility] Direct contact: [phone] --[address] [place]  --[email or web address]; password [email or web address]  7PM-7AM contact night coverage as above 12/19/2017, 4:55 PM  LOS: 2 days   Consultants:  VVS  Oncology  IR  Procedures:  7/9 US guided right neck mass biopsy.  Mx 18g core biopsy.  Antimicrobials:  Zosyn 7/8 >>  Vancomycin 7/8 >>  Interval history/Subjective: Feels ok. Poor appetite. No vomiting. Breathing ok. Has pain in right foot.  Objective: Vitals:  Vitals:   12/19/17 1119 12/19/17  1642  BP: 123/71 (!) 133/102  Pulse: 88 99  Resp: 13 (!) 22  Temp: 98.2 F (36.8 C) 98.8 F (37.1 C)  SpO2: 99% 99%    Exam:  Constitutional:  . Appears calm and comfortable Respiratory:  . CTA bilaterally, no w/r/r.  . Respiratory effort normal. Cardiovascular:  . RRR, no m/r/g . No LE extremity edema   Skin:  . Right foot gangrene of toes noted Psychiatric:  . Mental status o Mood, affect appropriate  I have personally reviewed the following:   Labs:  Hemoglobin stable, 8.3.  Remainder CBC unremarkable.  Basic metabolic panel unremarkable.  Imaging studies:  CT neck noted.  Scheduled Meds: . atorvastatin  40 mg Oral Daily  . escitalopram  20 mg Oral Daily  . gabapentin  200 mg Oral BID  . pantoprazole  40 mg Oral Daily  . sodium chloride flush  3 mL Intravenous Q12H   Continuous Infusions: . sodium chloride    . argatroban 1 mcg/kg/min (12/19/17 0748)  . piperacillin-tazobactam (ZOSYN)  IV 3.375 g (12/19/17 1114)  . [START ON 12/20/2017] vancomycin      Principal Problem:   Postoperative wound infection Active Problems:   S/P aortobifemoral bypass surgery   Lung mass   Cervical lymphadenopathy   DVT (deep venous thrombosis) (HCC)   PAD (peripheral artery disease) (HCC)   LOS: 2 days

## 2017-12-19 NOTE — Progress Notes (Signed)
Pharmacy Antibiotic Note  Tony [name] is a 52 y.o. male admitted on 12/17/2017 s/p bypass graft on legs now with presumed infection on lower extremities. Admitted with rehab facility with drainage from legs. Starting empiric abx for presumed wound infection. SCr bumped 0.94>1.33 today. Wbc wnl, afebrile.  Vancomycin trough resulted supratherapeutic at 23 for goal of 10-15, drawn ~65min early.  Plan: Reduce vancomycin to 500mg  IV q12h (goal 10-15) Zosyn 3.375 g IV q8h (4h infusion) Monitor renal fx, cultures, VT at new steady state   Height: 6\' 5"  (195.6 cm) Weight: 157 lb 13.6 oz (71.6 kg) IBW/kg (Calculated) : 89.1  Temp (24hrs), Avg:98.9 F (37.2 C), Min:98.2 F (36.8 C), Max:99.6 F (37.6 C)  Recent Labs  Lab 12/17/17 1207 12/17/17 1214 12/18/17 0054 12/19/17 0424 12/19/17 1352  WBC 6.9  --  6.1 7.7  --   CREATININE 0.98  --  0.94 1.33*  --   LATICACIDVEN  --  0.84  --   --   --   VANCOTROUGH  --   --   --   --  23*     Antimicrobials this admission: 7/8 zosyn > 7/8 vancomycin >  Dose adjustments this admission: 7/10 VT: 23 on 1g IV q12h - decrease to 500mg  IV q12h  Microbiology results: N/A  Elicia Lamp, PharmD, BCPS Clinical Pharmacist Clinical phone [phone] Please check AMION for all [place] contact numbers 12/19/2017 3:01 PM

## 2017-12-20 DIAGNOSIS — I96 Gangrene, not elsewhere classified: Secondary | ICD-10-CM | POA: Diagnosis present

## 2017-12-20 DIAGNOSIS — N179 Acute kidney failure, unspecified: Secondary | ICD-10-CM

## 2017-12-20 LAB — BASIC METABOLIC PANEL
Anion gap: 14 (ref 5–15)
BUN: 11 mg/dL (ref 6–20)
CALCIUM: 9.1 mg/dL (ref 8.9–10.3)
CO2: 23 mmol/L (ref 22–32)
CREATININE: 2.03 mg/dL — AB (ref 0.61–1.24)
Chloride: 101 mmol/L (ref 98–111)
GFR, EST AFRICAN AMERICAN: 43 mL/min — AB (ref >60)
GFR, EST NON AFRICAN AMERICAN: 37 mL/min — AB (ref >60)
Glucose, Bld: 94 mg/dL (ref 70–99)
Potassium: 4 mmol/L (ref 3.5–5.1)
SODIUM: 138 mmol/L (ref 135–145)

## 2017-12-20 LAB — CBC
HCT: 28 % — ABNORMAL LOW (ref 39.0–52.0)
HEMOGLOBIN: 8.6 g/dL — AB (ref 13.0–17.0)
MCH: 28.9 pg (ref 26.0–34.0)
MCHC: 30.7 g/dL (ref 30.0–36.0)
MCV: 94 fL (ref 78.0–100.0)
PLATELETS: 417 10*3/uL — AB (ref 150–400)
RBC: 2.98 MIL/uL — ABNORMAL LOW (ref 4.22–5.81)
RDW: 13 % (ref 11.5–15.5)
WBC: 8.1 10*3/uL (ref 4.0–10.5)

## 2017-12-20 LAB — APTT: APTT: 65 s — AB (ref 24–36)

## 2017-12-20 MED ORDER — HYDROCODONE-ACETAMINOPHEN 5-325 MG PO TABS: 1.0000 | ORAL_TABLET | Freq: Four times a day (QID) | ORAL | Status: DC | PRN

## 2017-12-20 MED ORDER — SODIUM CHLORIDE 0.9 % IV SOLN
3.0000 g | Freq: Four times a day (QID) | INTRAVENOUS | Status: DC
  Administered 2017-12-20 – 2017-12-22 (×8): 3 g via INTRAVENOUS
  Filled 2017-12-20 (×11): qty 3

## 2017-12-20 MED ORDER — OXYCODONE-ACETAMINOPHEN 5-325 MG PO TABS
1.0000 | ORAL_TABLET | ORAL | Status: DC | PRN
  Administered 2017-12-20 – 2017-12-22 (×5): 2 via ORAL
  Filled 2017-12-20 (×5): qty 2

## 2017-12-20 MED ORDER — LACTATED RINGERS IV SOLN: INTRAVENOUS | Status: DC

## 2017-12-20 MED ORDER — SODIUM CHLORIDE 0.9 % IV SOLN
INTRAVENOUS | Status: DC
  Administered 2017-12-20 – 2017-12-24 (×10): via INTRAVENOUS

## 2017-12-20 NOTE — Progress Notes (Addendum)
PROGRESS NOTE  Tony [name] FTD:[id number] DOB: [birth year] DOA: 12/17/2017 PCP: [name], MD  Brief Narrative: 52 year old man PMH critical limb ischemia, status post aortobifemoral bypass, right femoral artery to popliteal bypass, status post thrombectomy left limb aVF bypass.  Discharged on Xarelto.  Presented 7/8 with drainage from left groin wound and left below-knee incision.  Admitted by vascular surgery and started on IV antibiotics.  Because of neck pain, further imaging was obtained which revealed spiculated lung mass concerning for malignancy, patient transferred to hospitalist service.  Assessment/Plan Acute kidney injury  --Suspect related to vancomycin.  Trough was slightly high yesterday.  Will discontinue vancomycin as below.  Change Zosyn to Unasyn. --IV fluids.  Strict I/O.  BMP in a.m.  Left groin postop wound infection  --Continue management per vascular surgery.  MRSA PCR was negative in June.  Given acute kidney injury, will modify antibiotics to Unasyn.  No history of Pseudomonas infection known.  Discontinue vancomycin.   Peripheral vascular disease with right foot gangrene.   --Continue management per vascular surgery.  Right upper lobe spiculated nodule with associated lymphadenopathy concerning for primary lung cancer vs lymphoma, with associated hypercoagulable state. Status post biopsy 7/8. --Follow-up pathology.  Preliminary results consistent with carcinoma, not lymphoma.  Oncology is recommended CT chest, abdomen, pelvis, MRI brain to evaluate for primary tumor and complete staging.  Will hold today given acute kidney injury. --Management per oncology pending results.  Venous thrombosis right subclavian vein, right brachiocephalic vein, right internal jugular vein --Continue agatroban for now.  Further recommendations from vascular surgery in regard to any procedures.  Hematology has recommended transitioning to fondaparinux on discharge.  DVT  prophylaxis: agatroban Code Status: Full Family Communication: brother at bedside 7/11 Disposition Plan: home    Murray Hodgkins, MD  [facility] Direct contact: [phone] --[address] [place]  --[email or web address]; password [email or web address]  7PM-7AM contact night coverage as above 12/20/2017, 11:57 AM  LOS: 3 days   Consultants:  VVS  Oncology  IR  Procedures:  7/9 US guided right neck mass biopsy.  Mx 18g core biopsy.  Antimicrobials:  Unaysn 7/11 >>  Zosyn 7/8 >> 7/10  Vancomycin 7/8 >> 7/10  Interval history/Subjective: Continues to have pain in the right side of his neck, some in right foot.  Objective: Vitals:  Vitals:   12/20/17 0005 12/20/17 0550  BP: 130/70 126/72  Pulse: 84 92  Resp: (!) 23 (!) 29  Temp: 98.2 F (36.8 C) 98.4 F (36.9 C)  SpO2: 97% 98%    Exam: Constitutional:   . Appears calm, mildly uncomfortable Respiratory:  . CTA bilaterally, no w/r/r.  . Respiratory effort normal.  Cardiovascular:  . RRR, no m/r/g . No LE extremity edema   Skin:  . No change in gangrene right toes Psychiatric:  . Mental status o Mood, affect appropriate  I have personally reviewed the following:   Labs:  Creatinine 1.33 >> 2.03.  BUN within normal limits and without significant change.  Scheduled Meds: . atorvastatin  40 mg Oral Daily  . escitalopram  20 mg Oral Daily  . gabapentin  200 mg Oral BID  . pantoprazole  40 mg Oral Daily  . sodium chloride flush  3 mL Intravenous Q12H   Continuous Infusions: . sodium chloride    . ampicillin-sulbactam (UNASYN) IV    . argatroban 1 mcg/kg/min (12/20/17 5176)    Principal Problem:   Postoperative wound infection Active Problems:   S/P aortobifemoral bypass surgery   Lung mass  Cervical lymphadenopathy   DVT (deep venous thrombosis) (HCC)   PAD (peripheral artery disease) ([place])   LOS: 3 days

## 2017-12-20 NOTE — Progress Notes (Signed)
Oncology short note  Called and discussed with pathology -- prelim result consistent with carcinoma (not lymphoma) - ? Lung vs other primary. IHC pending-- will have final results likely tomorrow. Plan -will f/u when final pathology results available. -would recommend getting CT chest/abd/pelvis and MRI brain to evaluate for primary tumor and complete staging  Sullivan Lone MD MS

## 2017-12-20 NOTE — Progress Notes (Signed)
ANTICOAGULATION CONSULT NOTE - [place] for Argatroban while Xarelto on hold Indication: DVT  Patient Measurements: Height: 6\' 5"  (195.6 cm) Weight: 157 lb 13.6 oz (71.6 kg) IBW/kg (Calculated) : 89.1  Wt: 81.6 kg Ht: 6'1" Heparin Dosing Weight: n/a  Vital Signs: Temp: 98.4 F (36.9 C) (07/11 0550) Temp Source: Oral (07/11 0550) BP: 126/72 (07/11 0550) Pulse Rate: 92 (07/11 0550)  Labs: Recent Labs    12/17/17 1207 12/17/17 1633  12/18/17 0054 12/18/17 1003 12/19/17 0424 12/20/17 0447  HGB 9.0*  --   --  8.6*  --  8.3* 8.6*  HCT 29.2*  --   --  27.2*  --  26.2* 28.0*  PLT 404*  --   --  383  --  386 417*  APTT 38* 37*   < > 70*  --  61* 65*  LABPROT 23.1*  --   --   --  21.5*  --   --   INR 2.07  --   --   --  1.89  --   --   HEPARINUNFRC  --  >2.20*  --  0.80*  --   --   --   CREATININE 0.98  --   --  0.94  --  1.33*  --    < > = values in this interval not displayed.    Estimated Creatinine Clearance: 68 mL/min (A) (by C-G formula based on SCr of 1.33 mg/dL (H)).   Medications:  Scheduled:  . atorvastatin  40 mg Oral Daily  . escitalopram  20 mg Oral Daily  . gabapentin  200 mg Oral BID  . pantoprazole  40 mg Oral Daily  . sodium chloride flush  3 mL Intravenous Q12H    Assessment: 52 yo male s/p thrombectomy last admission, discharged on Xarelto. Last dose 7/8 at 0830 (was to complete loading dose on 7/14). Now admitted with wound infection. Pharmacy consulted for IV anticoagulation with argatroban, given patient refusal of pork-derived products. Argatroban resumed 7/9 s/p lymph node biopsy.  APTT remains therapeutic. CBC stable. No bleeding documented.   Goal of Therapy:  aPTT 50-90 seconds Monitor platelets by anticoagulation protocol: Yes   Plan:  -Continue Argatroban 1 mcg/kg/min -Monitor daily aPTT/CBC, s/sx bleeding -F/u Oncology plans for anticoagulation - Xarelto on hold  Elicia Lamp, PharmD, BCPS Clinical  Pharmacist Clinical phone [phone] Please check AMION for all [place] contact numbers 12/20/2017 9:12 AM

## 2017-12-21 LAB — BASIC METABOLIC PANEL
Anion gap: 12 (ref 5–15)
BUN: 13 mg/dL (ref 6–20)
CO2: 22 mmol/L (ref 22–32)
CREATININE: 1.9 mg/dL — AB (ref 0.61–1.24)
Calcium: 8.6 mg/dL — ABNORMAL LOW (ref 8.9–10.3)
Chloride: 105 mmol/L (ref 98–111)
GFR calc Af Amer: 46 mL/min — ABNORMAL LOW (ref >60)
GFR, EST NON AFRICAN AMERICAN: 40 mL/min — AB (ref >60)
Glucose, Bld: 79 mg/dL (ref 70–99)
POTASSIUM: 4.2 mmol/L (ref 3.5–5.1)
Sodium: 139 mmol/L (ref 135–145)

## 2017-12-21 LAB — APTT: aPTT: 59 seconds — ABNORMAL HIGH (ref 24–36)

## 2017-12-21 LAB — CBC
HCT: 27 % — ABNORMAL LOW (ref 39.0–52.0)
Hemoglobin: 8.3 g/dL — ABNORMAL LOW (ref 13.0–17.0)
MCH: 28.8 pg (ref 26.0–34.0)
MCHC: 30.7 g/dL (ref 30.0–36.0)
MCV: 93.8 fL (ref 78.0–100.0)
Platelets: 399 10*3/uL (ref 150–400)
RBC: 2.88 MIL/uL — AB (ref 4.22–5.81)
RDW: 13.1 % (ref 11.5–15.5)
WBC: 9 10*3/uL (ref 4.0–10.5)

## 2017-12-21 MED ORDER — FONDAPARINUX SODIUM 7.5 MG/0.6ML ~~LOC~~ SOLN
7.5000 mg | Freq: Every day | SUBCUTANEOUS | Status: DC
  Administered 2017-12-21 – 2018-01-22 (×33): 7.5 mg via SUBCUTANEOUS
  Filled 2017-12-21 (×34): qty 0.6

## 2017-12-21 NOTE — Progress Notes (Signed)
ANTICOAGULATION CONSULT NOTE - Follow-Up Consult  Pharmacy Consult for Argatroban while Xarelto on hold Indication: DVT  Patient Measurements: Height: 6\' 5"  (195.6 cm) Weight: 157 lb 13.6 oz (71.6 kg) IBW/kg (Calculated) : 89.1  Wt: 81.6 kg Ht: 6'1" Heparin Dosing Weight: n/a  Vital Signs: Temp: 98.1 F (36.7 C) (07/12 0528) Temp Source: Oral (07/12 0528) BP: 138/73 (07/12 0528)  Labs: Recent Labs    12/18/17 1003  12/19/17 0424 12/20/17 0447 12/20/17 0938 12/21/17 0248  HGB  --    < > 8.3* 8.6*  --  8.3*  HCT  --   --  26.2* 28.0*  --  27.0*  PLT  --   --  386 417*  --  399  APTT  --   --  61* 65*  --  59*  LABPROT 21.5*  --   --   --   --   --   INR 1.89  --   --   --   --   --   CREATININE  --   --  1.33*  --  2.03* 1.90*   < > = values in this interval not displayed.    Estimated Creatinine Clearance: 47.6 mL/min (A) (by C-G formula based on SCr of 1.9 mg/dL (H)).   Medications:  Scheduled:  . atorvastatin  40 mg Oral Daily  . escitalopram  20 mg Oral Daily  . gabapentin  200 mg Oral BID  . pantoprazole  40 mg Oral Daily  . sodium chloride flush  3 mL Intravenous Q12H    Assessment: 52 yo male s/p thrombectomy last admission, discharged on Xarelto. Last dose 7/8 at 0830 (was to complete loading dose on 7/14). Now admitted with wound infection. Pharmacy consulted for IV anticoagulation with argatroban, given patient refusal of pork-derived products. Argatroban resumed 7/9 s/p lymph node biopsy.  APTT remains therapeutic this morning at 59. Hgb remains stable at 8.3, plt 399. No s/sx of bleeding documented.   Goal of Therapy:  aPTT 50-90 seconds Monitor platelets by anticoagulation protocol: Yes   Plan:  -Continue Argatroban 1 mcg/kg/min -Monitor daily aPTT/CBC, s/sx bleeding -F/u Oncology plans for anticoagulation - Xarelto on hold  Doylene Canard, PharmD Clinical Pharmacist  Pager: [phone] Phone: [phone] 12/21/2017 8:39 AM

## 2017-12-21 NOTE — Progress Notes (Signed)
PROGRESS NOTE  Tony [name]:[id number] DOB: 25-May-1966 DOA: 12/17/2017 PCP: [name], MD  Brief Narrative: 52 year old man PMH critical limb ischemia, status post aortobifemoral bypass, right femoral artery to popliteal bypass, status post thrombectomy left limb aVF bypass.  Discharged on Xarelto.  Presented 7/8 with drainage from left groin wound and left below-knee incision.  Admitted by vascular surgery and started on IV antibiotics.  Because of neck pain, further imaging was obtained which revealed spiculated lung mass concerning for malignancy, patient transferred to hospitalist service.  Assessment/Plan Acute kidney injury  --Suspect related to vancomycin.  Renal function somewhat improved today.  Urine output adequate.   --will continue IV fluids, check BMP in a.m.  Left groin postop wound infection  --discussed with Dr. Scot Dock.  Patient appears to be improving.  He recommends changing to oral antibiotics tomorrow, continue oral antibiotics as an outpatient until follow-up in approximately 2 weeks, which he will arrange.  Peripheral vascular disease with right foot gangrene.   --Per Dr. Scot Dock, this will be monitored conservatively, plan to allow for auto amputation as this will preserve more tissue.  From Dr. Scot Dock standpoint, patient does not need to remain in the hospital past tomorrow.  Right upper lobe spiculated nodule with associated lymphadenopathy concerning for primary lung cancer vs lymphoma, with associated hypercoagulable state. Status post biopsy 7/8. --Pathology results pending, await final oncology recommendations.  Holding off on chest CT, abdomen and pelvis, MRI brain until renal function has improved.    Venous thrombosis right subclavian vein, right brachiocephalic vein, right internal jugular vein --As no procedures contemplated at this point, will change to fondaparinux in preparation for discharge.  DVT prophylaxis: agatroban Code Status:  Full Family Communication: spoke with wife by telephone Disposition Plan: home    Murray Hodgkins, MD  [facility] Direct contact: [phone] --Via [place]  --[email or web address]; password [email or web address]  7PM-7AM contact night coverage as above 12/21/2017, 4:19 PM  LOS: 4 days   Consultants:  VVS  Oncology  IR  Procedures:  7/9 US guided right neck mass biopsy.  Mx 18g core biopsy.  Antimicrobials:  Unaysn 7/11 >>  Zosyn 7/8 >> 7/10  Vancomycin 7/8 >> 7/10  Interval history/Subjective: Pain controlled with medications.  Appetite remains poor.  Feels okay.  Objective: Vitals:  Vitals:   12/21/17 0528 12/21/17 1228  BP: 138/73 140/78  Pulse:  95  Resp: 17 18  Temp: 98.1 F (36.7 C) 97.9 F (36.6 C)  SpO2: 97% 98%    Exam: Constitutional:   . Appears calm and comfortable Respiratory:  . CTA bilaterally, no w/r/r . Respiratory effort normal Cardiovascular:  . RRR, no m/r/g . No LE extremity edema   Skin:  . No change in gangrene right toes. Psychiatric:  . Appears depressed  I have personally reviewed the following:   Labs:  Creatinine 1.33 >> 2.03 >> 1.9. K+ WNL  Hgb stable 8.3. WBC 9.0  UOP 1000, +1  Scheduled Meds: . atorvastatin  40 mg Oral Daily  . escitalopram  20 mg Oral Daily  . gabapentin  200 mg Oral BID  . pantoprazole  40 mg Oral Daily  . sodium chloride flush  3 mL Intravenous Q12H   Continuous Infusions: . sodium chloride    . sodium chloride 150 mL/hr at 12/21/17 1606  . ampicillin-sulbactam (UNASYN) IV 3 g (12/21/17 1429)  . argatroban 1 mcg/kg/min (12/21/17 1421)    Principal Problem:   Postoperative wound infection Active Problems:   S/P aortobifemoral  bypass surgery   Lung mass   Cervical lymphadenopathy   DVT (deep venous thrombosis) (HCC)   PAD (peripheral artery disease) (HCC)   Gangrene of right foot (HCC)   AKI (acute kidney injury) ([place])   LOS: 4 days

## 2017-12-21 NOTE — Progress Notes (Addendum)
  Progress Note    12/21/2017 9:06 AM  Subjective:  No new complaints this morning   Vitals:   12/20/17 1953 12/21/17 0528  BP: 138/76 138/73  Pulse: (!) 104   Resp: 18 17  Temp: 97.9 F (36.6 C) 98.1 F (36.7 C)  SpO2: 99% 97%   Physical Exam: Cardiac:  RRR Lungs:  Non labored Incisions:  R groin and popliteal incisions well healed; L groin with 1 small superior aspect remaining open but granulating, no frank infection; L popliteal incision with some serous collection on dressing but no active drainage Extremities:  R foot with dry gangrene all toes Abdomen:  Soft Neurologic: A&O  CBC    Component Value Date/Time   WBC 9.0 12/21/2017 0248   RBC 2.88 (L) 12/21/2017 0248   HGB 8.3 (L) 12/21/2017 0248   HCT 27.0 (L) 12/21/2017 0248   PLT 399 12/21/2017 0248   MCV 93.8 12/21/2017 0248   MCH 28.8 12/21/2017 0248   MCHC 30.7 12/21/2017 0248   RDW 13.1 12/21/2017 0248   LYMPHSABS 0.8 12/17/2017 1207   MONOABS 0.6 12/17/2017 1207   EOSABS 0.2 12/17/2017 1207   BASOSABS 0.1 12/17/2017 1207    BMET    Component Value Date/Time   NA 139 12/21/2017 0248   K 4.2 12/21/2017 0248   CL 105 12/21/2017 0248   CO2 22 12/21/2017 0248   GLUCOSE 79 12/21/2017 0248   BUN 13 12/21/2017 0248   CREATININE 1.90 (H) 12/21/2017 0248   CALCIUM 8.6 (L) 12/21/2017 0248   GFRNONAA 40 (L) 12/21/2017 0248   GFRAA 46 (L) 12/21/2017 0248    INR    Component Value Date/Time   INR 1.89 12/18/2017 1003     Intake/Output Summary (Last 24 hours) at 12/21/2017 0906 Last data filed at 12/20/2017 1729 Gross per 24 hour  Intake 669.26 ml  Output 600 ml  Net 69.26 ml     Assessment/Plan:  52 y.o. male is s/p s/p ABF and R fem-pop with subsequent thrombectomy L ABF limb and L fem pop  Pharmacy adjusting abx regimen due to bump in renal function No change in exam of L groin and popliteal incisions; continue dry dressing changes R foot dry gangrene also stable We will continue to  monitor incisions while oncology workup is ongoing   Dagoberto Ligas, PA-C [facility] [phone] 12/21/2017 9:06 AM  I have interviewed the patient and examined the patient. I agree with the findings by the PA. His incisions look fine.  There is just a small amount of serous drainage from the below the knee incision on the left.  He is currently on IV Unasyn.  I think it would be reasonable to convert to p.o. antibiotics tomorrow.  I will check back on Monday.  If any problems over the weekend Dr. Servando Snare is on-call.   Gae Gallop, MD [phone]

## 2017-12-22 LAB — BASIC METABOLIC PANEL
Anion gap: 12 (ref 5–15)
BUN: 12 mg/dL (ref 6–20)
CALCIUM: 8.7 mg/dL — AB (ref 8.9–10.3)
CO2: 18 mmol/L — ABNORMAL LOW (ref 22–32)
Chloride: 109 mmol/L (ref 98–111)
Creatinine, Ser: 1.58 mg/dL — ABNORMAL HIGH (ref 0.61–1.24)
GFR calc Af Amer: 58 mL/min — ABNORMAL LOW (ref >60)
GFR, EST NON AFRICAN AMERICAN: 50 mL/min — AB (ref >60)
GLUCOSE: 90 mg/dL (ref 70–99)
Potassium: 4.2 mmol/L (ref 3.5–5.1)
Sodium: 139 mmol/L (ref 135–145)

## 2017-12-22 LAB — CBC
HCT: 28.7 % — ABNORMAL LOW (ref 39.0–52.0)
Hemoglobin: 8.9 g/dL — ABNORMAL LOW (ref 13.0–17.0)
MCH: 29 pg (ref 26.0–34.0)
MCHC: 31 g/dL (ref 30.0–36.0)
MCV: 93.5 fL (ref 78.0–100.0)
PLATELETS: 453 10*3/uL — AB (ref 150–400)
RBC: 3.07 MIL/uL — ABNORMAL LOW (ref 4.22–5.81)
RDW: 13.1 % (ref 11.5–15.5)
WBC: 8.4 10*3/uL (ref 4.0–10.5)

## 2017-12-22 LAB — APTT: aPTT: 43 seconds — ABNORMAL HIGH (ref 24–36)

## 2017-12-22 MED ORDER — AMOXICILLIN-POT CLAVULANATE 875-125 MG PO TABS
1.0000 | ORAL_TABLET | Freq: Two times a day (BID) | ORAL | Status: DC
  Administered 2017-12-22 – 2017-12-30 (×17): 1 via ORAL
  Filled 2017-12-22 (×17): qty 1

## 2017-12-22 MED ORDER — OXYCODONE-ACETAMINOPHEN 5-325 MG PO TABS
1.0000 | ORAL_TABLET | ORAL | Status: DC | PRN
  Administered 2017-12-22 – 2017-12-25 (×13): 2 via ORAL
  Filled 2017-12-22 (×14): qty 2

## 2017-12-22 MED ORDER — HYDROMORPHONE HCL 1 MG/ML IJ SOLN
0.5000 mg | INTRAMUSCULAR | Status: DC | PRN
  Administered 2017-12-22 – 2017-12-25 (×15): 0.5 mg via INTRAVENOUS
  Filled 2017-12-22 (×15): qty 1

## 2017-12-22 NOTE — Progress Notes (Signed)
PROGRESS NOTE  Tony [name] GBT:[id number] DOB: 08-25-1965 DOA: 12/17/2017 PCP: [name], MD  Brief Narrative: 52 year old man PMH critical limb ischemia, status post aortobifemoral bypass, right femoral artery to popliteal bypass, status post thrombectomy left limb aVF bypass.  Discharged on Xarelto.  Presented 7/8 with drainage from left groin wound and left below-knee incision.  Admitted by vascular surgery and started on IV antibiotics.  Because of neck pain, further imaging was obtained which revealed spiculated lung mass concerning for malignancy, patient transferred to hospitalist service.  Assessment/Plan Acute kidney injury  --Presumably secondary to vancomycin.  Renal function recovering.  We will continue IV fluids and check BMP in a.m.   Left groin postop wound infection  --Will switch to oral antibiotics today as recommended by vascular surgery.  Vascular surgery will arrange outpatient follow-up in approximately 2 weeks.    Peripheral vascular disease with right foot gangrene.   --Per Dr. Scot Dock, this will be monitored conservatively, plan to allow for auto amputation as this will preserve more tissue.    Right upper lobe spiculated nodule with associated lymphadenopathy concerning for primary lung cancer vs lymphoma, with associated hypercoagulable state. Status post biopsy 7/8. --Pathology results are pending.  Will hold off on chest CT, abdomen, pelvis, MRI brain until renal function has improved.  Oncology recommendations 1 biopsy available.  Venous thrombosis right subclavian vein, right brachiocephalic vein, right internal jugular vein --Continue fondaparinux.  DVT prophylaxis: agatroban Code Status: Full Family Communication:  Disposition Plan: home    Murray Hodgkins, MD  [facility] Direct contact: [phone] --Via [place]  --[email or web address]; password [email or web address]  7PM-7AM contact night coverage as above 12/22/2017, 12:16 PM  LOS: 5 days    Consultants:  VVS  Oncology  IR  Procedures:  7/9 US guided right neck mass biopsy.  Mx 18g core biopsy.  Antimicrobials:  Unaysn 7/11 >> 7/13  Augmentin 7/13  Zosyn 7/8 >> 7/10  Vancomycin 7/8 >> 7/10  Interval history/Subjective: Pain controlled with medication.  Some nausea.  Poor appetite.  Objective: Vitals:  Vitals:   12/21/17 2008 12/22/17 0520  BP: (!) 161/83 (!) 141/71  Pulse:    Resp: 17 17  Temp: 98.3 F (36.8 C) 98 F (36.7 C)  SpO2: 97% 96%    Exam: Constitutional:   . Appears calm and comfortable Respiratory:  . CTA bilaterally, no w/r/r.  . Respiratory effort normal. Cardiovascular:  . RRR, no m/r/g Psychiatric:  . Mental status o Appears depressed.  Affect flat.  I have personally reviewed the following:   Labs:  Creatinine 1.33 >> 2.03 >> 1.9 >> 1.58.  Potassium within normal limits.  BUN within normal limits.  Hgb stable 8.9.  Scheduled Meds: . atorvastatin  40 mg Oral Daily  . escitalopram  20 mg Oral Daily  . fondaparinux (ARIXTRA) injection  7.5 mg Subcutaneous q1800  . gabapentin  200 mg Oral BID  . pantoprazole  40 mg Oral Daily  . sodium chloride flush  3 mL Intravenous Q12H   Continuous Infusions: . sodium chloride    . sodium chloride 150 mL/hr at 12/22/17 0709  . ampicillin-sulbactam (UNASYN) IV 3 g (12/22/17 0932)    Principal Problem:   Postoperative wound infection Active Problems:   S/P aortobifemoral bypass surgery   Lung mass   Cervical lymphadenopathy   DVT (deep venous thrombosis) (HCC)   PAD (peripheral artery disease) (HCC)   Gangrene of right foot (HCC)   AKI (acute kidney injury) ([place])   LOS:  5 days

## 2017-12-23 LAB — BASIC METABOLIC PANEL
ANION GAP: 11 (ref 5–15)
BUN: 10 mg/dL (ref 6–20)
CHLORIDE: 112 mmol/L — AB (ref 98–111)
CO2: 18 mmol/L — ABNORMAL LOW (ref 22–32)
CREATININE: 1.37 mg/dL — AB (ref 0.61–1.24)
Calcium: 8.6 mg/dL — ABNORMAL LOW (ref 8.9–10.3)
GFR calc non Af Amer: 59 mL/min — ABNORMAL LOW (ref >60)
Glucose, Bld: 90 mg/dL (ref 70–99)
POTASSIUM: 4 mmol/L (ref 3.5–5.1)
SODIUM: 141 mmol/L (ref 135–145)

## 2017-12-23 MED ORDER — ONDANSETRON HCL 4 MG PO TABS
4.0000 mg | ORAL_TABLET | Freq: Four times a day (QID) | ORAL | Status: DC
  Administered 2017-12-23 – 2018-01-08 (×63): 4 mg via ORAL
  Filled 2017-12-23 (×64): qty 1

## 2017-12-23 MED ORDER — PROMETHAZINE HCL 25 MG PO TABS: 12.5000 mg | ORAL_TABLET | Freq: Three times a day (TID) | ORAL | Status: DC | PRN

## 2017-12-23 NOTE — Progress Notes (Addendum)
PROGRESS NOTE  Tony [name] JOA:[id number] DOB: August 25, 1965 DOA: 12/17/2017 PCP: [name], MD  Brief Narrative: 52 year old man PMH critical limb ischemia, status post aortobifemoral bypass, right femoral artery to popliteal bypass, status post thrombectomy left limb aVF bypass.  Discharged on Xarelto.  Presented 7/8 with drainage from left groin wound and left below-knee incision.  Admitted by vascular surgery and started on IV antibiotics.  Because of neck pain, further imaging was obtained which revealed spiculated lung mass concerning for malignancy, patient transferred to hospitalist service.  Assessment/Plan Acute kidney injury  --Secondary to vancomycin.  Renal function continues to improve.  Continue IV fluids, check BMP in a.m.  Left groin postop wound infection  --Continue oral antibiotics on discharge until follow-up with vascular surgery in approximately 2 weeks.  Peripheral vascular disease with right foot gangrene.   --Per Dr. Scot Dock will continue conservative management allow for autoamputation as this will preserve more tissue.  Right upper lobe spiculated nodule with associated lymphadenopathy concerning for primary lung cancer vs lymphoma, with associated hypercoagulable state. Status post biopsy 7/8. --Await pathology results.  Outpatient follow-up with oncology will be planned.  If renal function improved, tomorrow will check CT abdomen, pelvis, MRI brain to complete staging prior to discharge.  Venous thrombosis right subclavian vein, right brachiocephalic vein, right internal jugular vein --Keep arm elevated.  Continue fondaparinux.  DVT prophylaxis: agatroban Code Status: Full Family Communication:  Disposition Plan: home    Murray Hodgkins, MD  [facility] Direct contact: [phone] --Via [place]  --[email or web address]; password [email or web address]  7PM-7AM contact night coverage as above 12/23/2017, 11:30 AM  LOS: 6 days    Consultants:  VVS  Oncology  IR  Procedures:  7/9 US guided right neck mass biopsy.  Mx 18g core biopsy.  Antimicrobials:  Unaysn 7/11 >> 7/13  Augmentin 7/13 >>  Zosyn 7/8 >> 7/10  Vancomycin 7/8 >> 7/10  Interval history/Subjective: Has increased swelling right arm which is somewhat painful although swelling has decreased a bit this morning.  Increasing pain right foot.  Nausea.  Very poor appetite.  Objective: Vitals:  Vitals:   12/22/17 1941 12/23/17 0454  BP: (!) 168/78 (!) 162/91  Pulse:    Resp: 18 19  Temp: 97.9 F (36.6 C) 98.1 F (36.7 C)  SpO2: 98% 96%    Exam: Constitutional:   . Appears calm, depressed, uncomfortable Respiratory:  . CTA bilaterally, no w/r/r.  . Respiratory effort normal. Cardiovascular:  . RRR, no m/r/g . No LE extremity edema   . There is 2+ right upper extremity edema.  Radial pulse 2+ on the right. Skin:  . There is some blistering noted right toe, most toes with gangrene changes . Groin wound on the left appears to be healing well.  Wound around the left knee appears to be healing.  There may be some small exudate at that site. Psychiatric:  . Mental status o Mood depressed, affect appropriate . judgment and insight appear intact   I have personally reviewed the following:   Labs:  Creatinine 1.33 >> 2.03 >> 1.9 >> 1.58 >> 1.37  EKG independently reviewed showed sinus rhythm, inferior MI, age unknown.  LVH, old.  Unclear why EKG was performed.  Scheduled Meds: . amoxicillin-clavulanate  1 tablet Oral Q12H  . atorvastatin  40 mg Oral Daily  . escitalopram  20 mg Oral Daily  . fondaparinux (ARIXTRA) injection  7.5 mg Subcutaneous q1800  . gabapentin  200 mg Oral BID  . ondansetron  4 mg Oral Q6H  . pantoprazole  40 mg Oral Daily  . sodium chloride flush  3 mL Intravenous Q12H   Continuous Infusions: . sodium chloride    . sodium chloride 150 mL/hr at 12/23/17 0335    Principal Problem:   Postoperative  wound infection Active Problems:   S/P aortobifemoral bypass surgery   Lung mass   Cervical lymphadenopathy   DVT (deep venous thrombosis) (HCC)   PAD (peripheral artery disease) (HCC)   Gangrene of right foot (HCC)   AKI (acute kidney injury) ([place])   LOS: 6 days   Time spent 35 minutes, >50% counseling patient at bedside and wife by telephone, discussed diagnoses, assessment and recommended plan.

## 2017-12-24 ENCOUNTER — Inpatient Hospital Stay (HOSPITAL_COMMUNITY): Payer: BLUE CROSS/BLUE SHIELD

## 2017-12-24 LAB — BASIC METABOLIC PANEL
Anion gap: 6 (ref 5–15)
BUN: 8 mg/dL (ref 6–20)
CO2: 17 mmol/L — ABNORMAL LOW (ref 22–32)
Calcium: 7 mg/dL — ABNORMAL LOW (ref 8.9–10.3)
Chloride: 119 mmol/L — ABNORMAL HIGH (ref 98–111)
Creatinine, Ser: 1.04 mg/dL (ref 0.61–1.24)
Glucose, Bld: 124 mg/dL — ABNORMAL HIGH (ref 70–99)
POTASSIUM: 3.1 mmol/L — AB (ref 3.5–5.1)
SODIUM: 142 mmol/L (ref 135–145)

## 2017-12-24 MED ORDER — IOHEXOL 300 MG/ML  SOLN
100.0000 mL | Freq: Once | INTRAMUSCULAR | Status: AC | PRN
  Administered 2017-12-24: 100 mL via INTRAVENOUS

## 2017-12-24 MED ORDER — POTASSIUM CHLORIDE CRYS ER 20 MEQ PO TBCR
40.0000 meq | EXTENDED_RELEASE_TABLET | Freq: Two times a day (BID) | ORAL | Status: AC
  Administered 2017-12-24 (×2): 40 meq via ORAL
  Filled 2017-12-24 (×2): qty 2

## 2017-12-24 MED ORDER — DRONABINOL 2.5 MG PO CAPS
2.5000 mg | ORAL_CAPSULE | Freq: Two times a day (BID) | ORAL | Status: DC
  Administered 2017-12-24 – 2018-01-23 (×60): 2.5 mg via ORAL
  Filled 2017-12-24 (×60): qty 1

## 2017-12-24 NOTE — Progress Notes (Signed)
PROGRESS NOTE  Tony [name] KYH:[id number] DOB: [birth year] DOA: 12/17/2017 PCP: [name], MD  Brief Narrative: 52 year old man PMH critical limb ischemia, status post aortobifemoral bypass, right femoral artery to popliteal bypass, status post thrombectomy left limb aVF bypass.  Discharged on Xarelto.  Presented 7/8 with drainage from left groin wound and left below-knee incision.  Admitted by vascular surgery and started on IV antibiotics.  Because of neck pain, further imaging was obtained which revealed spiculated lung mass concerning for malignancy, patient transferred to hospitalist service.  Assessment/Plan Venous thrombosis right subclavian vein, right brachiocephalic vein, right internal jugular vein --Keep arm elevated.  Continue fondaparinux.  Right upper lobe spiculated nodule with associated lymphadenopathy concerning for primary lung cancer vs lymphoma, with associated hypercoagulable state. Status post biopsy 7/8. --Await pathology results and oncology recommendations. --As renal function has normalized, will proceed with metastatic work-up as recommended by oncology to include CT chest, abdomen, pelvis and MRI brain.  Acute kidney injury  --Resolved.  Secondary to IV vancomycin.  Left groin postop wound infection  --Reviewed per vascular surgery.  Continue antibiotics on discharge until follow-up with vascular surgery.  Peripheral vascular disease with right foot gangrene.   --Per vascular surgery, conservative management, allow for autoamputation. --soak the foot daily in luke warm Dial soap soaks  DVT prophylaxis: agatroban Code Status: Full Family Communication:  Disposition Plan: home    Murray Hodgkins, MD  [facility] Direct contact: [phone] --Via amion app OR  --[email or web address]; password [email or web address]  7PM-7AM contact night coverage as above 12/24/2017, 3:06 PM  LOS: 7 days   Consultants:  VVS  Oncology  IR  Procedures:  7/9 US guided  right neck mass biopsy.  Mx 18g core biopsy.  Antimicrobials:  Unaysn 7/11 >> 7/13  Augmentin 7/13 >>  Zosyn 7/8 >> 7/10  Vancomycin 7/8 >> 7/10  Interval history/Subjective: Edema was better this morning, somewhat worse this afternoon.  No pain in hand.  Objective: Vitals:  Vitals:   12/24/17 0517 12/24/17 1120  BP: (!) 160/81 (!) 167/86  Pulse: (!) 102 (!) 105  Resp: 18 18  Temp: 98.3 F (36.8 C)   SpO2: 95% 95%    Exam: Constitutional:   . Appears calm, uncomfortable Neck:  . Edematous right lower neck and upper chest. Respiratory:  . CTA bilaterally, no w/r/r.  . Respiratory effort normal.  Cardiovascular:  . RRR, no m/r/g . 1+ bilateral LE extremity edema   . Radial pulse 2+ on the right. Psychiatric:  . Mental status o Mood, affect appropriate  I have personally reviewed the following:   Labs:  Creatinine 1.33 >> 2.03 >> 1.9 >> 1.58 >> 1.37  Scheduled Meds: . amoxicillin-clavulanate  1 tablet Oral Q12H  . atorvastatin  40 mg Oral Daily  . escitalopram  20 mg Oral Daily  . fondaparinux (ARIXTRA) injection  7.5 mg Subcutaneous q1800  . gabapentin  200 mg Oral BID  . ondansetron  4 mg Oral Q6H  . pantoprazole  40 mg Oral Daily  . sodium chloride flush  3 mL Intravenous Q12H   Continuous Infusions: . sodium chloride    . sodium chloride 150 mL/hr at 12/24/17 1241    Principal Problem:   Postoperative wound infection Active Problems:   S/P aortobifemoral bypass surgery   Lung mass   Cervical lymphadenopathy   DVT (deep venous thrombosis) (HCC)   PAD (peripheral artery disease) (HCC)   Gangrene of right foot (HCC)   AKI (acute kidney injury) ([place])  LOS: 7 days   Time spent 35 minutes, greater than 50% counseling wife and patient at bedside, discussing plan of care.

## 2017-12-24 NOTE — Progress Notes (Addendum)
   VASCULAR SURGERY ASSESSMENT & PLAN:   The toes on the right foot are dry with no erythema or drainage. I have instructed him to soak the foot daily in luke warm Dial soap soaks. I can follow this as an outpt.   Deitra Mayo, MD, FACS [employer] [phone] Office: [phone]   Subjective:  No complaints; says the below knee incision still drains.  Afebrile overnight  Vitals:   12/23/17 2100 12/24/17 0517  BP: (!) 158/82 (!) 160/81  Pulse:  (!) 102  Resp:  18  Temp:  98.3 F (36.8 C)  SpO2:  95%    Physical Exam: Bilateral groin incisions look good without drainage or erythema.  Left BK incision dressing is clean and incision looks good.  No erythema present. CBC    Component Value Date/Time   WBC 8.4 12/22/2017 0359   RBC 3.07 (L) 12/22/2017 0359   HGB 8.9 (L) 12/22/2017 0359   HCT 28.7 (L) 12/22/2017 0359   PLT 453 (H) 12/22/2017 0359   MCV 93.5 12/22/2017 0359   MCH 29.0 12/22/2017 0359   MCHC 31.0 12/22/2017 0359   RDW 13.1 12/22/2017 0359   LYMPHSABS 0.8 12/17/2017 1207   MONOABS 0.6 12/17/2017 1207   EOSABS 0.2 12/17/2017 1207   BASOSABS 0.1 12/17/2017 1207    BMET    Component Value Date/Time   NA 141 12/23/2017 0431   K 4.0 12/23/2017 0431   CL 112 (H) 12/23/2017 0431   CO2 18 (L) 12/23/2017 0431   GLUCOSE 90 12/23/2017 0431   BUN 10 12/23/2017 0431   CREATININE 1.37 (H) 12/23/2017 0431   CALCIUM 8.6 (L) 12/23/2017 0431   GFRNONAA 59 (L) 12/23/2017 0431   GFRAA >60 12/23/2017 0431    INR    Component Value Date/Time   INR 1.89 12/18/2017 1003     Intake/Output Summary (Last 24 hours) at 12/24/2017 0745 Last data filed at 12/24/2017 0518 Gross per 24 hour  Intake 240 ml  Output 875 ml  Net -635 ml     Assessment:   Pt's incisions all look good.  He says there is still some drainage from the BK incision, however, the dressing was changed this morning and is dry.  All incisions without erythema.  Continue abx. -cancer workup in  progress.   Leontine Locket, PA-C [facility] [phone] 12/24/2017 7:45 AM

## 2017-12-24 NOTE — Evaluation (Signed)
Occupational Therapy Evaluation Patient Details Name: Tony [name] MRN: [id number] DOB: 04/29/1966 Today's Date: 12/24/2017    History of Present Illness 52 y.o. male PMH significant of R aortobifemoral bypass anastomosis on 11-15-17, HTN, IBS, and GERD Presenting to ED with pain, coldness, and numbness of left foot. s/p aortofemoral bypass, left femoral to above-knee popliteal bypass 6/22.  Readmitted7/8 with L LE drainage from groin and L below knee incisions. Hospital course complicated by venous thrombosis R subclavian, R brachiocephalic and R internal jugular vein. Pt being worked up for lung cancer vs lymphoma.   Clinical Impression   Pt was ambulating with a RW and able to complete ADL independently prior to admission. He presents with generalized weakness, poor activity tolerance, neck and R foot pain. His R UE is edematous, but pt reports improvement. Pt able to ambulate 40 feet with RW before reporting shooting pain in his legs and need to return to room. He requires set up to min assist for ADL. Pt with very poor appetite. Will follow acutely. Recommending PT evaluation.    Follow Up Recommendations  Home health OT    Equipment Recommendations  None recommended by OT    Recommendations for Other Services PT consult     Precautions / Restrictions Precautions Precautions: Fall Precaution Comments: R neck and arm edema, gangrenous R toes Restrictions Weight Bearing Restrictions: No      Mobility Bed Mobility Overal bed mobility: Modified Independent             General bed mobility comments: HOB up  Transfers Overall transfer level: Needs assistance Equipment used: Rolling walker (2 wheeled) Transfers: Sit to/from Stand Sit to Stand: Min guard         General transfer comment: Min Guard A for stability and safety    Balance Overall balance assessment: Needs assistance   Sitting balance-Leahy Scale: Good       Standing balance-Leahy Scale: Poor                              ADL either performed or assessed with clinical judgement   ADL Overall ADL's : Needs assistance/impaired Eating/Feeding: Set up;Sitting Eating/Feeding Details (indicate cue type and reason): assist to open containers Grooming: Wash/dry hands;Standing;Min guard   Upper Body Bathing: Minimal assistance;Sitting   Lower Body Bathing: Min guard;Sit to/from stand   Upper Body Dressing : Sitting;Set up   Lower Body Dressing: Min guard;Sit to/from stand   Toilet Transfer: Min guard;Ambulation;RW   Toileting- Water quality scientist and Hygiene: Min guard;Sit to/from stand       Functional mobility during ADLs: Min guard;Rolling walker General ADL Comments: pt with poor activity tolerance due to weakness and pain, pt with poor appetite     Vision Baseline Vision/History: Wears glasses Wears Glasses: Reading only       Perception     Praxis      Pertinent Vitals/Pain Pain Assessment: Faces Faces Pain Scale: Hurts whole lot Pain Location: R side of neck, R foot Pain Descriptors / Indicators: Grimacing Pain Intervention(s): Monitored during session;Repositioned     Hand Dominance Right   Extremity/Trunk Assessment Upper Extremity Assessment Upper Extremity Assessment: Generalized weakness;RUE deficits/detail RUE Deficits / Details: edematous, pt reports improvement, educated in elevation and moving R UE for edema control RUE Coordination: decreased fine motor(due to edema)   Lower Extremity Assessment Lower Extremity Assessment: Generalized weakness RLE Deficits / Details: R toes gangrenous  Communication Communication Communication: No difficulties   Cognition Arousal/Alertness: Awake/alert Behavior During Therapy: Flat affect Overall Cognitive Status: Within Functional Limits for tasks assessed                                 General Comments: pt with minimal conversation   General Comments        Exercises     Shoulder Instructions      Home Living Family/patient expects to be discharged to:: Private residence Living Arrangements: Spouse/significant other;Children Available Help at Discharge: Available 24 hours/day;Family Type of Home: House Home Access: Stairs to enter [employer] of Steps: 3 Entrance Stairs-Rails: Right Home Layout: Two level;1/2 bath on main level;Able to live on main level with bedroom/bathroom Alternate Level Stairs-Number of Steps: 12 Alternate Level Stairs-Rails: Right Bathroom Shower/Tub: Teacher, early years/pre: Standard     Home Equipment: Environmental consultant - 2 wheels;Cane - single point;Crutches;Bedside commode   Additional Comments: Bed and full bath on 2nd floor. Pt spouse reports he would be able to sleep on couch and use 1/2 bath on main level if unable to navigate stairs in home      Prior Functioning/Environment Level of Independence: Independent with assistive device(s)        Comments: pt was ambulating with RW        OT Problem List: Decreased strength;Decreased activity tolerance;Impaired balance (sitting and/or standing);Pain      OT Treatment/Interventions: Self-care/ADL training;Therapeutic activities;Patient/family education;Balance training    OT Goals(Current goals can be found in the care plan section) Acute Rehab OT Goals Patient Stated Goal: have pain under control before return home OT Goal Formulation: With patient Time For Goal Achievement: 01/07/18 Potential to Achieve Goals: Fair ADL Goals Pt Will Perform Grooming: with modified independence;standing Pt Will Perform Lower Body Bathing: with modified independence;sit to/from stand Pt Will Perform Lower Body Dressing: with modified independence;sit to/from stand Pt Will Transfer to Toilet: with modified independence;ambulating Pt Will Perform Toileting - Clothing Manipulation and hygiene: with modified independence;sit to/from stand Additional ADL  Goal #1: Pt will be knowledgeable in edema management techniques for R UE.  OT Frequency: Min 2X/week   Barriers to D/C:            Co-evaluation              AM-PAC PT "6 Clicks" Daily Activity     Outcome Measure Help from another person eating meals?: A Little Help from another person taking care of personal grooming?: A Little Help from another person toileting, which includes using toliet, bedpan, or urinal?: A Little Help from another person bathing (including washing, rinsing, drying)?: A Little Help from another person to put on and taking off regular upper body clothing?: None Help from another person to put on and taking off regular lower body clothing?: A Little 6 Click Score: 19   End of Session Equipment Utilized During Treatment: Gait belt;Rolling walker  Activity Tolerance: Patient limited by fatigue;Patient limited by pain Patient left: in bed;with call bell/phone within reach;with family/visitor present  OT Visit Diagnosis: Unsteadiness on feet (R26.81);Muscle weakness (generalized) (M62.81);Pain                Time: 6063-0160 OT Time Calculation (min): 25 min Charges:  OT General Charges $OT Visit: 1 Visit OT Evaluation $OT Eval Moderate Complexity: 1 Mod OT Treatments $Self Care/Home Management : 8-22 mins G-Codes:     29-Dec-2017 Almyra Free  Werner Lean, OTR/L Pager: [phone]  Malka So 12/24/2017, 11:09 AM

## 2017-12-24 NOTE — Progress Notes (Signed)
Met the patient and his wife. Offered to have prayer with them but they declined. Conard Novak, Chaplain   12/24/17 1500  Clinical Encounter Type  Visited With Patient and family together  Visit Type Initial;Spiritual support  Referral From Nurse  Consult/Referral To Chaplain  Spiritual Encounters  Spiritual Needs Other (Comment) (patient and wife said declined visit)  Stress Factors  Patient Stress Factors None identified  Family Stress Factors None identified

## 2017-12-25 ENCOUNTER — Inpatient Hospital Stay (HOSPITAL_COMMUNITY): Payer: BLUE CROSS/BLUE SHIELD

## 2017-12-25 ENCOUNTER — Telehealth: Payer: Self-pay | Admitting: Medical Oncology

## 2017-12-25 DIAGNOSIS — C3411 Malignant neoplasm of upper lobe, right bronchus or lung: Secondary | ICD-10-CM | POA: Diagnosis present

## 2017-12-25 LAB — BASIC METABOLIC PANEL
ANION GAP: 9 (ref 5–15)
BUN: 8 mg/dL (ref 6–20)
CO2: 18 mmol/L — AB (ref 22–32)
Calcium: 8.5 mg/dL — ABNORMAL LOW (ref 8.9–10.3)
Chloride: 111 mmol/L (ref 98–111)
Creatinine, Ser: 1.31 mg/dL — ABNORMAL HIGH (ref 0.61–1.24)
GLUCOSE: 92 mg/dL (ref 70–99)
POTASSIUM: 4.3 mmol/L (ref 3.5–5.1)
SODIUM: 138 mmol/L (ref 135–145)

## 2017-12-25 MED ORDER — GADOBENATE DIMEGLUMINE 529 MG/ML IV SOLN
15.0000 mL | Freq: Once | INTRAVENOUS | Status: AC
  Administered 2017-12-25: 15 mL via INTRAVENOUS

## 2017-12-25 MED ORDER — SODIUM CHLORIDE 0.9 % IV SOLN
INTRAVENOUS | Status: AC
  Administered 2017-12-25 (×2): via INTRAVENOUS

## 2017-12-25 MED ORDER — HYDROMORPHONE HCL 1 MG/ML IJ SOLN
1.0000 mg | INTRAMUSCULAR | Status: DC | PRN
  Administered 2017-12-25 – 2017-12-26 (×12): 1 mg via INTRAVENOUS
  Filled 2017-12-25 (×12): qty 1

## 2017-12-25 NOTE — Care Management Note (Signed)
Case Management Note Marvetta Gibbons RN, BSN Unit 4E-Case Manager [phone]  Patient Details  Name: Tony [name] MRN: [id number] Date of Birth: Sep 24, 1965  Subjective/Objective:   Pt admitted with infected wound started on IV abx per vascular. C/o of neck pain, on imaging lung mass found concerning for malignancy, s/p biopsy on 7/8 oncology following. Metastatic work up in progress.                  Action/Plan: PTA pt was at [facility] after his last admit, otherwise home with wife, PT/OTevals have been ordered. CM to follow for transition of care needs. Pt has been with [facility] in past.   Expected Discharge Date:                  Expected Discharge Plan:     In-House Referral:     Discharge planning Services     Post Acute Care Choice:    Choice offered to:     DME Arranged:    DME Agency:     HH Arranged:    HH Agency:     Status of Service:     If discussed at [employer], dates discussed:    Additional Comments:  Dawayne Patricia, RN 12/25/2017, 2:53 PM

## 2017-12-25 NOTE — Progress Notes (Signed)
PT Cancellation Note  Patient Details Name: Tony [name] MRN: [id number] DOB: 04/01/1966   Cancelled Treatment:    Reason Eval/Treat Not Completed: Patient declined, no reason specified   [place] 12/25/2017, 2:36 PM [facility] PT [phone]

## 2017-12-25 NOTE — Progress Notes (Signed)
PROGRESS NOTE  Tony [name]:[id number] DOB: Mar 02, 1966 DOA: 12/17/2017 PCP: Tony Amel, MD  Brief Narrative: 52 year old man PMH critical limb ischemia, status post aortobifemoral bypass, right femoral artery to popliteal bypass, status post thrombectomy left limb aVF bypass.  Discharged on Xarelto.  Presented 7/8 with drainage from left groin wound and left below-knee incision.  Admitted by vascular surgery and started on IV antibiotics.  Because of neck pain, further imaging was obtained which revealed spiculated lung mass concerning for malignancy, patient transferred to hospitalist service.  Assessment/Plan Poorly differentiated adenocarcinoma of unclear primary.  Possibly longer based on right upper nodule but not clear.  Associated hypercoagulable state.  MRI brain.  Abdomen and pelvis without metastatic disease. --Discussed with Dr. Irene [name].  No further inpatient evaluation suggested.  Plan short-term outpatient follow-up for discussion of treatment options. --Discussed with wife and patient at bedside.  Anticipate discharge probably in the next 48 hours.  Wife would like to discuss with oncologist at bedside if possible I have relayed this information to the oncologist.  Venous thrombosis right subclavian vein, right brachiocephalic vein, right internal jugular vein --No apparent change.  Keep arm elevated.  Continue fondaparinux.  Acute kidney injury  --Initially secondary to vancomycin, resolved with fluids.  Mild increased creatinine probably related to contrast.  Hydrate today and recheck BMP in a.m.  Left groin postop wound infection  --Vascular surgery signed off.  Plan to continue antibiotics and follow-up with vascular surgery as an outpatient in the next 2 weeks.   Peripheral vascular disease with right foot gangrene.   --Per vascular surgery, conservative management, allow for autoamputation. --soak the foot daily in luke warm Dial soap soaks   Pain is poorly  controlled, will consult palliative medicine for assistance with symptom management and goals of care.  Will recheck renal function in the morning.  Performance status is poor and the patient is not eating.  Risk for rehospitalization is high.  DVT prophylaxis: agatroban Code Status: Full Family Communication:  Disposition Plan: home    Tony Hodgkins, MD  [facility] Direct contact: [phone] --Via [place]  --[email or web address]; password [email or web address]  7PM-7AM contact night coverage as above 12/25/2017, 12:28 PM  LOS: 8 days   Consultants:  VVS  Oncology  IR  Procedures:  7/9 US guided right neck mass biopsy.  Mx 18g core biopsy.  Antimicrobials:  Unaysn 7/11 >> 7/13  Augmentin 7/13 >>  Zosyn 7/8 >> 7/10  Vancomycin 7/8 >> 7/10  Interval history/Subjective: Feels okay at the moment but having more pain today.  Very poor appetite, not eating anything.  No pain in right hand.  Objective: Vitals:  Vitals:   12/25/17 1120 12/25/17 1142  BP: (!) 169/94 (!) 147/88  Pulse:  86  Resp:  18  Temp:    SpO2:  96%    Exam: Constitutional:   . Appears calm, uncomfortable, nontoxic Respiratory:  . CTA bilaterally, no w/r/r  . Respiratory effort normal Cardiovascular:  . RRR, no m/r/g . No LE extremity edema   . Radial pulse 2+ right extremity Abdomen:  . Soft, nontender, nondistended Musculoskeletal:  . No change in right upper extremity edema Skin:  . No change in her right foot gangrene Psychiatric:  . Mental status . Withdrawn, flat affect, appears depressed   I have personally reviewed the following:   Labs:  Creatinine 1.33 >> 2.03 >> 1.9 >> 1.58 >> 1.37 >> 1.04 >> 1.31  Scheduled Meds: . amoxicillin-clavulanate  1 tablet Oral Q12H  .  atorvastatin  40 mg Oral Daily  . dronabinol  2.5 mg Oral BID AC  . escitalopram  20 mg Oral Daily  . fondaparinux (ARIXTRA) injection  7.5 mg Subcutaneous q1800  . gabapentin  200 mg Oral BID  . ondansetron   4 mg Oral Q6H  . pantoprazole  40 mg Oral Daily  . sodium chloride flush  3 mL Intravenous Q12H   Continuous Infusions: . sodium chloride    . sodium chloride 125 mL/hr at 12/25/17 1200    Principal Problem:   Postoperative wound infection Active Problems:   S/P aortobifemoral bypass surgery   Lung mass   Cervical lymphadenopathy   DVT (deep venous thrombosis) (HCC)   PAD (peripheral artery disease) (HCC)   Gangrene of right foot (HCC)   AKI (acute kidney injury) ([place])   LOS: 8 days

## 2017-12-25 NOTE — Telephone Encounter (Signed)
Request call back from Dr. Irene Limbo to discuss path/CT results,Note to Dr Irene Limbo.

## 2017-12-26 DIAGNOSIS — R52 Pain, unspecified: Secondary | ICD-10-CM | POA: Diagnosis present

## 2017-12-26 DIAGNOSIS — Z515 Encounter for palliative care: Secondary | ICD-10-CM

## 2017-12-26 LAB — BASIC METABOLIC PANEL
ANION GAP: 9 (ref 5–15)
BUN: 5 mg/dL — ABNORMAL LOW (ref 6–20)
CALCIUM: 8.6 mg/dL — AB (ref 8.9–10.3)
CO2: 20 mmol/L — ABNORMAL LOW (ref 22–32)
Chloride: 111 mmol/L (ref 98–111)
Creatinine, Ser: 1.16 mg/dL (ref 0.61–1.24)
GFR calc Af Amer: >60 mL/min (ref >60)
Glucose, Bld: 113 mg/dL — ABNORMAL HIGH (ref 70–99)
POTASSIUM: 4 mmol/L (ref 3.5–5.1)
SODIUM: 140 mmol/L (ref 135–145)

## 2017-12-26 MED ORDER — ACETAMINOPHEN 500 MG PO TABS
1000.0000 mg | ORAL_TABLET | Freq: Three times a day (TID) | ORAL | Status: DC
  Administered 2017-12-26 – 2018-01-23 (×85): 1000 mg via ORAL
  Filled 2017-12-26 (×85): qty 2

## 2017-12-26 MED ORDER — BOOST / RESOURCE BREEZE PO LIQD CUSTOM
1.0000 | Freq: Three times a day (TID) | ORAL | Status: DC
  Administered 2017-12-26 – 2018-01-22 (×57): 1 via ORAL

## 2017-12-26 MED ORDER — HYDROMORPHONE HCL 2 MG PO TABS
4.0000 mg | ORAL_TABLET | ORAL | Status: AC | PRN
  Administered 2017-12-26 – 2017-12-27 (×6): 4 mg via ORAL
  Filled 2017-12-26 (×7): qty 2

## 2017-12-26 MED ORDER — SENNA 8.6 MG PO TABS
1.0000 | ORAL_TABLET | Freq: Every day | ORAL | Status: DC
  Administered 2017-12-26 – 2018-01-08 (×14): 8.6 mg via ORAL
  Filled 2017-12-26 (×14): qty 1

## 2017-12-26 MED ORDER — HYDROMORPHONE HCL 1 MG/ML IJ SOLN
1.0000 mg | Freq: Once | INTRAMUSCULAR | Status: AC
  Administered 2017-12-26: 1 mg via INTRAVENOUS
  Filled 2017-12-26: qty 1

## 2017-12-26 MED ORDER — HYDROMORPHONE HCL 1 MG/ML IJ SOLN
0.5000 mg | INTRAMUSCULAR | Status: AC | PRN
  Administered 2017-12-26: 0.5 mg via INTRAVENOUS
  Administered 2017-12-26 – 2017-12-27 (×5): 1 mg via INTRAVENOUS
  Filled 2017-12-26 (×7): qty 1

## 2017-12-26 MED ORDER — GABAPENTIN 300 MG PO CAPS
300.0000 mg | ORAL_CAPSULE | Freq: Two times a day (BID) | ORAL | Status: DC
  Administered 2017-12-26 – 2017-12-28 (×4): 300 mg via ORAL
  Filled 2017-12-26 (×4): qty 1

## 2017-12-26 NOTE — Progress Notes (Signed)
   VASCULAR SURGERY ASSESSMENT & PLAN:   His toes are continuing to demarcate.  He has no erythema or drainage.  He still has a small amount of serous drainage from his left below the knee incision but this is improving.  There is no cellulitis.  His groin incisions look fine.  He will continue to soak the right foot daily in lukewarm dial soap soaks.    I will follow him as an outpatient after discharge.  SUBJECTIVE:   No specific complaints this morning.  His wife has some questions about his stage IV cancer.  PHYSICAL EXAM:   Vitals:   12/25/17 2336 12/26/17 0056 12/26/17 0414 12/26/17 0425  BP: (!) 171/87   (!) 168/82  Pulse:    99  Resp:    16  Temp:  (!) 100.6 F (38.1 C) 98.6 F (37 C) 98.8 F (37.1 C)  TempSrc:  Oral Oral Oral  SpO2:    96%  Weight:      Height:       His groin incisions look fine. There is still a small amount of serous drainage from the left below the knee incision.  There is no erythema. The toes on the right foot are demarcating.  There is no erythema or drainage of the foot.  LABS:   PROBLEM LIST:    Principal Problem:   Adenocarcinoma ([place]) Active Problems:   S/P aortobifemoral bypass surgery   Lung mass   Postoperative wound infection   Cervical lymphadenopathy   DVT (deep venous thrombosis) ([facility])   PAD (peripheral artery disease) ([facility])   Gangrene of right foot ([facility])   AKI (acute kidney injury) ([place])   CURRENT MEDS:   . amoxicillin-clavulanate  1 tablet Oral Q12H  . atorvastatin  40 mg Oral Daily  . dronabinol  2.5 mg Oral BID AC  . escitalopram  20 mg Oral Daily  . fondaparinux (ARIXTRA) injection  7.5 mg Subcutaneous q1800  . gabapentin  200 mg Oral BID  . ondansetron  4 mg Oral Q6H  . pantoprazole  40 mg Oral Daily  . sodium chloride flush  3 mL Intravenous Q12H    Deitra Mayo Beeper: [phone] Office: [phone] 12/26/2017

## 2017-12-26 NOTE — Progress Notes (Signed)
Occupational Therapy Treatment Patient Details Name: Tony [name] MRN: [id number] DOB: 1966-03-07 Today's Date: 12/26/2017    History of present illness 52 year old man PMH critical limb ischemia, status post aortobifemoral bypass, right femoral artery to popliteal bypass, status post thrombectomy left limb aVF bypass.  Discharged on Xarelto.  Presented 7/8 with drainage from left groin wound and left below-knee incision.  Admitted by vascular surgery and started on IV antibiotics.  Because of neck pain, further imaging was obtained which revealed spiculated lung mass concerning for malignancy, patient transferred to hospitalist service.   OT comments  Upon arrival, pt sitting up in bed and reporting significant pain at RUE and right side of neck. Pt with increased edema at RUE and R neck. Pt agreeable to PROM and retrograde massage for edema management. Providing pt with VCs for relaxation and breathing technique; pt able to fall asleep briefly during retrograde massage. After ROM, pt requesting food and was able to perform self feeding task at bed level. Continue to recommend dc to home with HHOT and will continue to follow acutely as admitted.    Follow Up Recommendations  Home health OT    Equipment Recommendations  None recommended by OT    Recommendations for Other Services PT consult    Precautions / Restrictions Precautions Precautions: Fall Precaution Comments: R neck and arm edema, gangrenous R toes Restrictions Weight Bearing Restrictions: No       Mobility Bed Mobility                  Transfers                      Balance Overall balance assessment: Needs assistance Sitting-balance support: No upper extremity supported;Feet supported Sitting balance-Leahy Scale: Good     Standing balance support: Bilateral upper extremity supported;During functional activity Standing balance-Leahy Scale: Poor Standing balance comment: relies on UE support or  physical A                           ADL either performed or assessed with clinical judgement   ADL Overall ADL's : Needs assistance/impaired Eating/Feeding: Set up;Supervision/ safety;Bed level Eating/Feeding Details (indicate cue type and reason): Pt able to self feed soup at bed level using BUEs. Pt demonstrating increased RUE ROM after massage                                   General ADL Comments: Focused session on edema management with elevation, ROM, and retrograde massage. After relaxation techniques for pain, pt able to perform self feeding     Vision       Perception     Praxis      Cognition Arousal/Alertness: Awake/alert Behavior During Therapy: Flat affect Overall Cognitive Status: Within Functional Limits for tasks assessed                                 General Comments: Pt focused on pain and with decreased interaction with therapist. Providing VCs for relaxation and breathing.         Exercises Exercises: General Upper Extremity;Other exercises General Exercises - Upper Extremity Shoulder Flexion: AAROM;Right;10 reps;Supine;Limitations Shoulder Flexion Limitations: Limited flexion due to pain Shoulder Extension: AAROM;Right;10 reps;Supine Shoulder ABduction: PROM;Right;10 reps;Supine Shoulder ADduction: PROM;Right;10 reps;Supine Elbow Flexion: PROM;AAROM;Right;10 reps;Supine  Elbow Extension: PROM;AAROM;Right;10 reps;Supine Wrist Flexion: PROM;AAROM;Right;10 reps;Supine Wrist Extension: PROM;Right;10 reps;Supine Other Exercises Other Exercises: Edema management with retrograde massage   Shoulder Instructions       General Comments Wife present throughout and very supportive    Pertinent Vitals/ Pain       Pain Assessment: Faces Faces Pain Scale: Hurts whole lot Pain Location: right UE and neck, right foot, left leg and ankle Pain Descriptors / Indicators: Aching;Grimacing;Guarding Pain Intervention(s):  Monitored during session;Limited activity within patient's tolerance;Premedicated before session;Repositioned;Relaxation;Ice applied;Utilized relaxation techniques;Other (comment)(Breathing)  Home Living                                          Prior Functioning/Environment              Frequency  Min 2X/week        Progress Toward Goals  OT Goals(current goals can now be found in the care plan section)  Progress towards OT goals: Not progressing toward goals - comment(Limited by significant pain at R side of neck andRUE)  Acute Rehab OT Goals Patient Stated Goal: have pain under control before return home OT Goal Formulation: With patient Time For Goal Achievement: 01/07/18 Potential to Achieve Goals: Fair ADL Goals Pt Will Perform Grooming: with modified independence;standing Pt Will Perform Lower Body Bathing: with modified independence;sit to/from stand Pt Will Perform Lower Body Dressing: with modified independence;sit to/from stand Pt Will Transfer to Toilet: with modified independence;ambulating Pt Will Perform Toileting - Clothing Manipulation and hygiene: with modified independence;sit to/from stand Additional ADL Goal #1: Pt will be knowledgeable in edema management techniques for R UE.  Plan Discharge plan remains appropriate    Co-evaluation                 AM-PAC PT "6 Clicks" Daily Activity     Outcome Measure   Help from another person eating meals?: A Little Help from another person taking care of personal grooming?: A Little Help from another person toileting, which includes using toliet, bedpan, or urinal?: A Little Help from another person bathing (including washing, rinsing, drying)?: A Little Help from another person to put on and taking off regular upper body clothing?: A Little Help from another person to put on and taking off regular lower body clothing?: A Little 6 Click Score: 18    End of Session    OT Visit  Diagnosis: Unsteadiness on feet (R26.81);Muscle weakness (generalized) (M62.81);Pain Pain - Right/Left: Right Pain - part of body: Shoulder;Arm(Neck)   Activity Tolerance Patient limited by pain   Patient Left in bed;with call bell/phone within reach;with family/visitor present   Nurse Communication Mobility status        Time: 6967-8938 OT Time Calculation (min): 28 min  Charges: OT General Charges $OT Visit: 1 Visit OT Treatments $Self Care/Home Management : 8-22 mins $Therapeutic Activity: 8-22 mins  [place], OTR/L Acute Rehab Pager: [phone] Office: [place] 12/26/2017, 5:22 PM

## 2017-12-26 NOTE — Consult Note (Signed)
                                                                                 Consultation Note Date: 12/26/2017   Patient Name: Tony [name]  DOB: 09/02/1965  MRN: [id number]  Age / Sex: 52 y.o., male  PCP: Koirala, Dibas, MD Referring Physician: Emokpae, Courage, MD  Reason for Consultation: Establishing goals of care and Pain control  HPI/Patient Profile: 52 y.o. male  with past medical history of HTN, HLD, arterial occlusive disease, PVD, IBS,  chronic back pain, anxiety, depression admitted on 12/17/2017 with increased drainage from incision sites and with critical limb ischemia of left lower extremity. S/P  Right Aortobifemoral bypass and fem-pop bypass 6/6 and found to thrombectomy with fem-pop bypass on left 6/22. Hospitalization complicated by uncontrolled pain likely s/t newly diagnosed metastatic adenocarcinoma with significant lymphadenopathy to neck. Also with venous thrombus of right subclavian, brachiocephalic, and IJ veins.   Clinical Assessment and Goals of Care: I met today with Tony [name] and joined by his wife, Tony [name]. We discussed their concerns with a focus on his pain management. His pain is 8/10 and at best is 7/10 mainly in right neck throbbing and radiation into back and chest. Site is swollen, reddened, and warm to touch. This pain is his main concern and not even his foot. He has poor appetite and complains of pain when swallowing - recommend SLP evaluation after GI studies.  Wife very concerned that they have not spoken with oncology as she would like more information regarding his diagnosis. She knows that they need an outpatient visit to discuss options for treatment. Left message on her behalf for Dr. Kale with wife's phone number.   Emotional support provided. I will follow up tomorrow.   Primary Decision Maker PATIENT    SUMMARY OF RECOMMENDATIONS   - Pain very uncontrolled - only 20-30 min relief with dilaudid 1 mg IV - Discussed pain plan as listed  below  Code Status/Advance Care Planning:  Full code - did not discuss   Symptom Management:   Poor intake: Boost/Breeze TID. Continue Marinol trial. Consider possibility of thrush (not evident on exam today, possible esophageal candida??).   Bowel Regimen: Senokot qhs.   Right neck pain (r/t cancer and IJ thrombus?):   Ice pack 15 min every 4 hours.   Tylenol 1000 mg po TID.   D/C oxycodone (he does not like how this makes him feel). Start dilaudid po 4 mg every 3 hours prn severe pain. He does tolerate IV dilaudid well but doesn't last long.   Increased gabapentin 300 BID from 200 BID.   Consider decadron  Consider d/c Lexapro, add Cymbalta  Palliative Prophylaxis:   Aspiration, Bowel Regimen, Delirium Protocol, Frequent Pain Assessment and Oral Care  Additional Recommendations (Limitations, Scope, Preferences):  Full Scope Treatment  Psycho-social/Spiritual:   Desire for further Chaplaincy support:yes  Additional Recommendations: Caregiving  Support/Resources  Prognosis:   Overall poor prognosis with stage IV cancer newly diagnosed and gangrene to right foot. Attempt to better manage pain/symptoms to see if functional status may improve.   Discharge Planning: To Be Determined      Primary Diagnoses:   Present on Admission: **None**   I have reviewed the medical record, interviewed the patient and family, and examined the patient. The following aspects are pertinent.  Past Medical History:  Diagnosis Date  . Anxiety   . Arterial occlusive disease    multilevel arterial occlusive disease/notes 11/13/2017  . Arthritis    "left knee" (11/13/2017)  . Chronic back pain   . Chronic lower back pain   . Depression   . Gastric ulcer due to Helicobacter pylori 2008  . GERD (gastroesophageal reflux disease)   . High cholesterol   . Hypertension   . IBS (irritable bowel syndrome)   . Mesenteric adenitis 2011   presumed/H&P  . Migraines    "use to suffer  migraines years ago" (11/13/2017)  . Peripheral vascular disease ([facility])   . Pneumonia 2011  . Ulcer    Social History   Socioeconomic History  . Marital status: Married    Spouse name: Not on file  . Number of children: 3  . Years of education: Not on file  . Highest education level: Not on file  Occupational History  . Occupation: Admin. Assistant    Employer: [employer]  Social Needs  . Financial resource strain: Not on file  . Food insecurity:    Worry: Not on file    Inability: Not on file  . Transportation needs:    Medical: Not on file    Non-medical: Not on file  Tobacco Use  . Smoking status: Former Smoker    Packs/day: 1.00    Years: 24.00    Pack years: 24.00    Types: Cigarettes    Start date: 11/28/2017    Last attempt to quit: 12/04/2017    Years since quitting: 0.0  . Smokeless tobacco: Never Used  Substance and Sexual Activity  . Alcohol use: Yes    Comment: 11/13/2017 "did drink wine q  1-2 months; nothing anymore"   . Drug use: Not Currently    Types: Marijuana    Comment: 11/13/2017  "last drug use was in ~ 1987 "  . Sexual activity: Not on file  Lifestyle  . Physical activity:    Days per week: Not on file    Minutes per session: Not on file  . Stress: Not on file  Relationships  . Social connections:    Talks on phone: Not on file    Gets together: Not on file    Attends religious service: Not on file    Active member of club or organization: Not on file    Attends meetings of clubs or organizations: Not on file    Relationship status: Not on file  Other Topics Concern  . Not on file  Social History Narrative   Lives with wife and two children.  Administrator at [employer]   Family History  Problem Relation Age of Onset  . Hypertension Mother   . Hypertension Father   . Coronary artery disease Father 52  . Heart attack Brother 55  . Coronary artery disease Brother   . Diabetes Neg Hx   . Stroke Neg Hx    Scheduled Meds: .  amoxicillin-clavulanate  1 tablet Oral Q12H  . atorvastatin  40 mg Oral Daily  . dronabinol  2.5 mg Oral BID AC  . escitalopram  20 mg Oral Daily  . fondaparinux (ARIXTRA) injection  7.5 mg Subcutaneous q1800  . gabapentin  200 mg Oral BID  . ondansetron  4 mg Oral Q6H  . pantoprazole    40 mg Oral Daily  . sodium chloride flush  3 mL Intravenous Q12H   Continuous Infusions: . sodium chloride Stopped (12/25/17 2338)   PRN Meds:.sodium chloride, acetaminophen **OR** acetaminophen, alum & mag hydroxide-simeth, bisacodyl, guaiFENesin-dextromethorphan, hydrALAZINE, HYDROmorphone (DILAUDID) injection, labetalol, LORazepam, oxyCODONE-acetaminophen, phenol, polyethylene glycol, promethazine, sodium chloride flush Allergies  Allergen Reactions  . Pork-Derived Products     UNSPECIFIED REASON Pt doesn't eat pork  . Shellfish-Derived Products Other (See Comments)    Doesn't want to eat  . Morphine And Related Other (See Comments)    hallucinations   Review of Systems  Constitutional: Positive for activity change, appetite change and fatigue.  HENT: Positive for sore throat and trouble swallowing.        Neck swelling and pain  Musculoskeletal: Positive for neck pain.  Neurological: Positive for weakness.  Psychiatric/Behavioral: The patient is nervous/anxious.     Physical Exam  Constitutional: He is oriented to person, place, and time. He appears well-developed.  HENT:  Head: Normocephalic and atraumatic.  Neck:  Right neck, tender to touch, swollen, reddened  Cardiovascular: Normal rate.  Pulmonary/Chest: Effort normal. No accessory muscle usage. No tachypnea. No respiratory distress.  Abdominal: Normal appearance.  Neurological: He is alert and oriented to person, place, and time.  Skin:  Right toes demarcating Did not assess left foot (vascular following)  Nursing note and vitals reviewed.   Vital Signs: BP (!) 163/80 (BP Location: Left Arm)   Pulse 95   Temp 98.7 F (37.1  C) (Oral)   Resp 18   Ht 6' 5" (1.956 m)   Wt 71.6 kg (157 lb 13.6 oz)   SpO2 97%   BMI 18.72 kg/m  Pain Scale: 0-10 POSS *See Group Information*: S-Acceptable,Sleep, easy to arouse Pain Score: 8    SpO2: SpO2: 97 % O2 Device:SpO2: 97 % O2 Flow Rate: .O2 Flow Rate (L/min): 2 L/min  IO: Intake/output summary:   Intake/Output Summary (Last 24 hours) at 12/26/2017 1347 Last data filed at 12/26/2017 0700 Gross per 24 hour  Intake 1818.03 ml  Output 475 ml  Net 1343.03 ml    LBM: Last BM Date: 12/23/17 Baseline Weight: Weight: 71.6 kg (157 lb 13.6 oz) Most recent weight: Weight: 71.6 kg (157 lb 13.6 oz)     Palliative Assessment/Data: 30%      Time Total: 60 min  Greater than 50%  of this time was spent counseling and coordinating care related to the above assessment and plan.  Signed by:  , NP Palliative Medicine Team Pager # [phone] (M-F 8a-5p) Team Phone # [phone] (Nights/Weekends)                

## 2017-12-26 NOTE — Progress Notes (Signed)
Patient Demographics:    Tony [name], is a 52 y.o. male, DOB - [birth year], ZWC:[id number]  Admit date - 12/17/2017   Admitting Physician Angelia Mould, MD  Outpatient Primary MD for the patient is [name], MD  LOS - 9   Chief Complaint  Patient presents with  . Post-op Problem        Subjective:    Tony [name] today has no fevers, no emesis,  No chest pain,  Pain control remains challenging, sister-in-law (Tony [name]) at bedside, wife Tony [name]) on the phone, questions answered  Assessment  & Plan :    Principal Problem:   Adenocarcinoma ([place]) Active Problems:   S/P aortobifemoral bypass surgery   Lung mass   Postoperative wound infection   Cervical lymphadenopathy   DVT (deep venous thrombosis) ([facility])   PAD (peripheral artery disease) ([facility])   Gangrene of right foot ([place])   AKI (acute kidney injury) [facility])  Brief Summary  52 year old man PMH critical limb ischemia, status post aortobifemoral bypass, right femoral artery to popliteal bypass, status post thrombectomy left limb aVF bypass admitted on 12/17/2017 with drainage from left groin wound and left below-knee incision and concerns about postop wound infection and cellulitis, patient also had neck pain imaging studies suggested a spiculated lung mass concerning for malignancy,    Plan:- 1)Poorly differentiated Adenocarcinoma of unclear primary--suspect lung cancer in a smoker (Rt upper lobe nodule)--- patient with hypercoagulable state, D/w Dr Irene Limbo (Oncology) will get upper GI barium studies, patient will need to follow-up with oncology within a week of discharge to discuss treatment options post discharge from the hospital.  Palliative care consult to help with pain management requested, spoke with Elmo Putt, NP  from palliative care services  2)Venous Thrombosis of the Right subclavian vein/right brachiocephalic vein/right internal  jugular vein--as per Dr. Irene Limbo treat empirically with fondaparinux (Arixtra)  3)Left groin postop wound infection- IMproving, Augmentin as ordered, vascular surgery input appreciated  4)Acute pain--patient has significant neck pain suspect due to significant adenopathy associated with malignancy, palliative care consult to help with pain management, discussed with Dr. Irene Limbo if radiation may be an option for pain control  5)PAD with right foot gangrene--- per vascular surgery no need for aggressive approach await autoamputation, continue soaks in lukewarm dial soap water daily  6)Social--- plan of care discussed with patient, his sister-in-law Tony [name], and his wife Tony [name]  Code Status : fULL CODE  Disposition Plan  : Possible discharge home in 1 to 2 days if able to achieve pain control with outpatient oncology follow-up  Consults  : Oncology/vascular surgery/palliative care/ interventionAL  radiology  Procedures:  7/9 US guided right neck mass biopsy. Mx 18g core biopsy.  Antimicrobials:  Unaysn 7/11 >> 7/13  Augmentin 7/13 >>  Zosyn 7/8 >> 7/10  Vancomycin 7/8 >> 7/10  DVT Prophylaxis  : Fondaparinux/Arixtra  Lab Results  Component Value Date   PLT 453 (H) 12/22/2017   Inpatient Medications  Scheduled Meds: . amoxicillin-clavulanate  1 tablet Oral Q12H  . atorvastatin  40 mg Oral Daily  . dronabinol  2.5 mg Oral BID AC  . escitalopram  20 mg Oral Daily  . fondaparinux (ARIXTRA) injection  7.5 mg Subcutaneous q1800  . gabapentin  200 mg Oral BID  .  ondansetron  4 mg Oral Q6H  . pantoprazole  40 mg Oral Daily  . sodium chloride flush  3 mL Intravenous Q12H   Continuous Infusions: . sodium chloride Stopped (12/25/17 2338)   PRN Meds:.sodium chloride, acetaminophen **OR** acetaminophen, alum & mag hydroxide-simeth, bisacodyl, guaiFENesin-dextromethorphan, hydrALAZINE, HYDROmorphone (DILAUDID) injection, labetalol, LORazepam, oxyCODONE-acetaminophen, phenol,  polyethylene glycol, promethazine, sodium chloride flush   Anti-infectives (From admission, onward)   Start     Dose/Rate Route Frequency Ordered Stop   12/22/17 1300  amoxicillin-clavulanate (AUGMENTIN) 875-125 MG per tablet 1 tablet     1 tablet Oral Every 12 hours 12/22/17 1219     12/20/17 1400  Ampicillin-Sulbactam (UNASYN) 3 g in sodium chloride 0.9 % 100 mL IVPB  Status:  Discontinued     3 g 200 mL/hr over 30 Minutes Intravenous Every 6 hours 12/20/17 1145 12/22/17 1219   12/20/17 0000  vancomycin (VANCOCIN) 500 mg in sodium chloride 0.9 % 100 mL IVPB  Status:  Discontinued     500 mg 100 mL/hr over 60 Minutes Intravenous Every 12 hours 12/19/17 1500 12/20/17 1145   12/18/17 0300  vancomycin (VANCOCIN) IVPB 1000 mg/200 mL premix  Status:  Discontinued     1,000 mg 200 mL/hr over 60 Minutes Intravenous Every 12 hours 12/17/17 1354 12/19/17 1500   12/17/17 2000  piperacillin-tazobactam (ZOSYN) IVPB 3.375 g  Status:  Discontinued     3.375 g 12.5 mL/hr over 240 Minutes Intravenous Every 8 hours 12/17/17 1354 12/20/17 1145   12/17/17 1430  piperacillin-tazobactam (ZOSYN) IVPB 3.375 g     3.375 g 100 mL/hr over 30 Minutes Intravenous  Once 12/17/17 1351 12/17/17 1916   12/17/17 1430  vancomycin (VANCOCIN) 1,750 mg in sodium chloride 0.9 % 500 mL IVPB     1,750 mg 250 mL/hr over 120 Minutes Intravenous  Once 12/17/17 1351 12/17/17 1747        Objective:   Vitals:   12/26/17 0056 12/26/17 0414 12/26/17 0425 12/26/17 1336  BP:   (!) 168/82 (!) 163/80  Pulse:   99 95  Resp:   16 18  Temp: (!) 100.6 F (38.1 C) 98.6 F (37 C) 98.8 F (37.1 C) 98.7 F (37.1 C)  TempSrc: Oral Oral Oral Oral  SpO2:   96% 97%  Weight:      Height:        Wt Readings from Last 3 Encounters:  12/18/17 71.6 kg (157 lb 13.6 oz)  12/04/17 81.6 kg (179 lb 14.3 oz)  11/28/17 81.6 kg (180 lb)     Intake/Output Summary (Last 24 hours) at 12/26/2017 1510 Last data filed at 12/26/2017 0700 Gross  per 24 hour  Intake 1818.03 ml  Output 475 ml  Net 1343.03 ml     Physical Exam  Gen:- Awake Alert, patient looks tired HEENT:- [place].AT, No sclera icterus Neck--adenopathy  lungs-  CTAB , fair air movement CV- S1, S2 normal Abd-  +ve B.Sounds, Abd Soft, No tenderness,    Extremity/Skin:-Right foot dry gangrene, bilateral upper extremity edema right more than left Psych-affect is flat, oriented x3 Neuro-no new focal deficits, no tremors   Data Review:   Micro Results No results found for this or any previous visit (from the past 240 hour(s)).  Radiology Reports Ct Soft Tissue Neck W Contrast  Result Date: 12/17/2017 CLINICAL DATA:  52 y/o M; recently removed right neck PICC line with swelling and pain in the region of PICC line. EXAM: CT NECK WITH CONTRAST TECHNIQUE: Multidetector CT imaging  of the neck was performed using the standard protocol following the bolus administration of intravenous contrast. CONTRAST:  100 cc Omnipaque 300 COMPARISON:  None. FINDINGS: Pharynx and larynx: Normal. No mass or swelling. Salivary glands: No inflammation, mass, or stone. Thyroid: Normal. Lymph nodes: Right cervical, bilateral supraclavicular, and upper mediastinal bulky lymphadenopathy. A right upper paratracheal lymph node measures 5.0 x 3.5 cm (series 3, image 109), left supraclavicular node measures 3.1 x 1.7 cm (series 3, image 85), and a right supraclavicular node measures 1.6 x 2.1 cm (series 3, image 68). Vascular: Deep venous thrombosis involving right subclavian vein, right brachiocephalic vein, and right internal jugular vein. Limited intracranial: Negative. Visualized orbits: Negative. Mastoids and visualized paranasal sinuses: Small right maxillary sinus mucous retention cyst. Skeleton: No acute or aggressive process. Upper chest: Spiculated nodule in the right upper lobe measuring 16 x 12 mm (series 3, image 115). Other: Mild edema in the right supraclavicular fossa and lower neck.  IMPRESSION: 1. Deep venous thrombosis involving the right subclavian vein, right brachiocephalic vein, and right internal jugular vein. 2. Edema in the right lower neck and supraclavicular fossa is probably related to venous congestion from DVT and/or phlebitis. No abscess. 3. Severe bulky lymphadenopathy of right cervical, bilateral supraclavicular, and upper mediastinal lymph nodes likely representing metastatic disease or lymphoproliferative disease. 4. Spiculated right upper lobe nodule measuring up to 16 mm. Consider one of the following in 3 months for both low-risk and high-risk individuals: (a) repeat chest CT, (b) follow-up PET-CT, or (c) tissue sampling. This recommendation follows the consensus statement: Guidelines for Management of Incidental Pulmonary Nodules Detected on CT Images: From the Fleischner Society 2017; Radiology 2017; 284:228-243. These results will be called to the ordering clinician or representative by the Radiologist Assistant, and communication documented in the PACS or zVision Dashboard. Electronically Signed   By: Kristine Garbe M.D.   On: 12/17/2017 15:04   Ct Chest W Contrast  Result Date: 12/25/2017 CLINICAL DATA:  Spiculated lung mass and adenopathy shown on neck CT, for further workup. EXAM: CT CHEST, ABDOMEN, AND PELVIS WITH CONTRAST TECHNIQUE: Multidetector CT imaging of the chest, abdomen and pelvis was performed following the standard protocol during bolus administration of intravenous contrast. CONTRAST:  136mL OMNIPAQUE IOHEXOL 300 MG/ML  SOLN COMPARISON:  12/17/2017 and CT abdomen from 08/14/2011 there is some mild fatty infiltration in segment IV adjacent to the falciform ligament. Otherwise unremarkable. FINDINGS: CT CHEST FINDINGS Cardiovascular: Aortic arch and branch vessel atherosclerotic vascular disease. Poor visualization of contrast in the right internal jugular vein, compatible with patient's known venous thrombosis. Mediastinum/Nodes: Bilateral  lower neck level IV adenopathy with ill definition. A left level Roman numeral 4 lymph node measures 2.0 cm in short axis on image 2/3. There is also bilateral supraclavicular adenopathy including a 1.9 cm right-sided supraclavicular node on image 7/3. Bulky paratracheal, prevascular, subcarinal, and right hilar adenopathy noted. An index right paratracheal node measures 3.6 cm in short axis on image 16/3. Enlarged right subpectoral lymph nodes are present along with scattered right axillary lymph nodes. Mild stranding in the anterior mediastinum. Lungs/Pleura: Moderate right and small left pleural effusion are present and are nonspecific for transudative versus exudative etiology. A spiculated right upper lobe nodule measures 2.4 by 1.3 by 1.4 cm, roughly stable from 12/17/2017. There is passive atelectasis in both lower lobes, right greater than left. Paraseptal emphysema noted. Musculoskeletal: Notable subcutaneous edema throughout the chest, somewhat confluent along the right upper chest. CT ABDOMEN PELVIS FINDINGS Hepatobiliary: Unremarkable Pancreas:  Unremarkable Spleen: Unremarkable Adrenals/Urinary Tract: Unremarkable Stomach/Bowel: Air-fluid levels in the rectum indicating diarrheal process. Borderline wall thickening several segments of proximal small bowel. Vascular/Lymphatic: Aortoiliac atherosclerotic vascular disease. Aortobifemoral graft small fluid collection superficial to the right groin likely representing residual hematoma from prior groin puncture, but only measuring about 2.4 by 2.1 by 4.3 cm (volume = 11 cm^3). No gas in this collection. Left inguinal lymph node 0.9 cm in short axis. Reproductive: Unremarkable Other: Diffuse subcutaneous edema. Small amount of pelvic ascites. Gas tracking along the anterior abdominal wall subcutaneous tissues, correlate with any anterior abdominal wall injections. Subcutaneous edema extends along the pubis and towards the penis. Musculoskeletal: Unremarkable  IMPRESSION: 1. Bulky adenopathy in the neck and upper chest. Persistent 2.4 cm spiculated right upper lobe nodule. The degree of adenopathy seems disproportionate to the size of the nodule, and this could represent adenopathy related to lung cancer or potentially 2 separate processes such as lymphoproliferative disease and a separate primary lung cancer. An infectious cause for the pulmonary nodule is less likely. 2. Extensive third spacing of fluid in the chest, abdomen and pelvis. Query hypoproteinemia or hypoalbuminemia. 3. Poor visualization of contrast in the right internal jugular vein compatible with the patient's known venous thrombosis. 4. Air fluid levels in the rectum indicating diarrheal process. Borderline wall thickening in the proximal small bowel, query proximal enteritis. 5. The small fluid collection superficial to the right groin puncture site is only about 11 cc in volume, and probably reflects a resolving hematoma. 6. Gas tracking along anterior abdominal wall subcutaneous tissues, probably from injections-correlate with any anterior abdominal wall subcutaneous injections recently. Electronically Signed   By: Van Clines M.D.   On: 12/25/2017 00:05   Mr Jeri Cos PY Contrast  Result Date: 12/25/2017 CLINICAL DATA:  52 year old male with right upper extremity and right central venous DVT with bulky cervical and thoracic lymphadenopathy and spiculated right apical lung nodule. EXAM: MRI HEAD WITHOUT AND WITH CONTRAST TECHNIQUE: Multiplanar, multiecho pulse sequences of the brain and surrounding structures were obtained without and with intravenous contrast. CONTRAST:  60mL MULTIHANCE GADOBENATE DIMEGLUMINE 529 MG/ML IV SOLN COMPARISON:  Neck CT 12/17/2017. Head CT without contrast 08/03/2006. Neck and chest CT FINDINGS: Brain: No midline shift, mass effect, or evidence of intracranial mass lesion. No abnormal enhancement identified. No dural thickening. No restricted diffusion to suggest  acute infarction. No ventriculomegaly, extra-axial collection or acute intracranial hemorrhage. Cervicomedullary junction and pituitary are within normal limits. Pearline Cables and white matter signal is within normal limits for age throughout the brain; Minimal nonspecific left frontal lobe white matter T2 and FLAIR hyperintensity (series 6, image 19). No cortical encephalomalacia or chronic cerebral blood products. Vascular: Major intracranial vascular flow voids are preserved. The major dural venous sinuses are enhancing and appear patent. The right IJ bulb at the skull base is patent. Skull and upper cervical spine: Negative visible cervical spine and spinal cord. Visualized bone marrow signal is within normal limits. Sinuses/Orbits: Negative orbits soft tissues. Paranasal sinuses and mastoids are stable and well pneumatized. Other: Visible internal auditory structures appear normal. Partially visible bulky right neck lymphadenopathy (series 4, image 14). IMPRESSION: 1. No cerebral metastatic disease or acute intracranial abnormality identified. 2. Right cervical lymphadenopathy redemonstrated. Electronically Signed   By: Genevie Ann M.D.   On: 12/25/2017 09:17   Ct Abdomen Pelvis W Contrast  Result Date: 12/25/2017 CLINICAL DATA:  Spiculated lung mass and adenopathy shown on neck CT, for further workup. EXAM: CT CHEST, ABDOMEN, AND  PELVIS WITH CONTRAST TECHNIQUE: Multidetector CT imaging of the chest, abdomen and pelvis was performed following the standard protocol during bolus administration of intravenous contrast. CONTRAST:  150mL OMNIPAQUE IOHEXOL 300 MG/ML  SOLN COMPARISON:  12/17/2017 and CT abdomen from 08/14/2011 there is some mild fatty infiltration in segment IV adjacent to the falciform ligament. Otherwise unremarkable. FINDINGS: CT CHEST FINDINGS Cardiovascular: Aortic arch and branch vessel atherosclerotic vascular disease. Poor visualization of contrast in the right internal jugular vein, compatible with  patient's known venous thrombosis. Mediastinum/Nodes: Bilateral lower neck level IV adenopathy with ill definition. A left level Roman numeral 4 lymph node measures 2.0 cm in short axis on image 2/3. There is also bilateral supraclavicular adenopathy including a 1.9 cm right-sided supraclavicular node on image 7/3. Bulky paratracheal, prevascular, subcarinal, and right hilar adenopathy noted. An index right paratracheal node measures 3.6 cm in short axis on image 16/3. Enlarged right subpectoral lymph nodes are present along with scattered right axillary lymph nodes. Mild stranding in the anterior mediastinum. Lungs/Pleura: Moderate right and small left pleural effusion are present and are nonspecific for transudative versus exudative etiology. A spiculated right upper lobe nodule measures 2.4 by 1.3 by 1.4 cm, roughly stable from 12/17/2017. There is passive atelectasis in both lower lobes, right greater than left. Paraseptal emphysema noted. Musculoskeletal: Notable subcutaneous edema throughout the chest, somewhat confluent along the right upper chest. CT ABDOMEN PELVIS FINDINGS Hepatobiliary: Unremarkable Pancreas: Unremarkable Spleen: Unremarkable Adrenals/Urinary Tract: Unremarkable Stomach/Bowel: Air-fluid levels in the rectum indicating diarrheal process. Borderline wall thickening several segments of proximal small bowel. Vascular/Lymphatic: Aortoiliac atherosclerotic vascular disease. Aortobifemoral graft small fluid collection superficial to the right groin likely representing residual hematoma from prior groin puncture, but only measuring about 2.4 by 2.1 by 4.3 cm (volume = 11 cm^3). No gas in this collection. Left inguinal lymph node 0.9 cm in short axis. Reproductive: Unremarkable Other: Diffuse subcutaneous edema. Small amount of pelvic ascites. Gas tracking along the anterior abdominal wall subcutaneous tissues, correlate with any anterior abdominal wall injections. Subcutaneous edema extends along  the pubis and towards the penis. Musculoskeletal: Unremarkable IMPRESSION: 1. Bulky adenopathy in the neck and upper chest. Persistent 2.4 cm spiculated right upper lobe nodule. The degree of adenopathy seems disproportionate to the size of the nodule, and this could represent adenopathy related to lung cancer or potentially 2 separate processes such as lymphoproliferative disease and a separate primary lung cancer. An infectious cause for the pulmonary nodule is less likely. 2. Extensive third spacing of fluid in the chest, abdomen and pelvis. Query hypoproteinemia or hypoalbuminemia. 3. Poor visualization of contrast in the right internal jugular vein compatible with the patient's known venous thrombosis. 4. Air fluid levels in the rectum indicating diarrheal process. Borderline wall thickening in the proximal small bowel, query proximal enteritis. 5. The small fluid collection superficial to the right groin puncture site is only about 11 cc in volume, and probably reflects a resolving hematoma. 6. Gas tracking along anterior abdominal wall subcutaneous tissues, probably from injections-correlate with any anterior abdominal wall subcutaneous injections recently. Electronically Signed   By: Van Clines M.D.   On: 12/25/2017 00:05   Ir US Guide Bx Asp/drain  Result Date: 12/18/2017 INDICATION: 52 year old male with a history of neck mass EXAM: IR ULTRASOUND GUIDANCE MEDICATIONS: None. ANESTHESIA/SEDATION: Moderate (conscious) sedation was employed during this procedure. A total of Versed 2.0 mg and Fentanyl 100 mcg was administered intravenously. Moderate Sedation Time: 12 minutes. The patient's level of consciousness and vital signs were  monitored continuously by radiology nursing throughout the procedure under my direct supervision. FLUOROSCOPY TIME:  None COMPLICATIONS: None PROCEDURE: Informed written consent was obtained from the patient after a thorough discussion of the procedural risks, benefits  and alternatives. All questions were addressed. Maximal Sterile Barrier Technique was utilized including caps, mask, sterile gowns, sterile gloves, sterile drape, hand hygiene and skin antiseptic. A timeout was performed prior to the initiation of the procedure. Patient positioned supine position on the ultrasound table. Ultrasound images of the right neck were performed with images stored sent to PACs. Patient is then prepped and draped in the usual sterile fashion. 1% lidocaine was used for local anesthesia. Using ultrasound guidance, multiple 18 gauge core biopsy performed of right neck mass. Tissue specimen placed in the saline. Sterile bandage was placed. Patient tolerated the procedure well and remained hemodynamically stable throughout. No complications were encountered and no significant blood loss. IMPRESSION: Status post ultrasound-guided biopsy of right neck mass. Signed, Dulcy Fanny. Dellia Nims, RPVI [facility] Radiology Electronically Signed   By: Corrie Mckusick D.O.   On: 12/18/2017 17:00   Dg Ang/ext/uni/or Left  Result Date: 12/01/2017 CLINICAL DATA:  Intraoperative arteriogram of the left lower leg EXAM: LEFT ANG/EXT/UNI/ OR FLUOROSCOPY TIME:  Not provided COMPARISON:  None FINDINGS: A single spot intraoperative radiographic image of the left knee provided for review Intra-arterial contrast has been injected from a more proximal location. Images demonstrate the sequela of presumably reverse venous distal femoral bypass grafting. The distal anastomosis appears widely patent without hemodynamically significant stenosis. The distal most aspect of the left superficial femoral artery as well as the left above and below knee popliteal artery appear widely patent. There are mixed occlusive and nonocclusive filling defect within the tibioperoneal trunk with an apparent single vessel runoff to the left lower leg via the posterior tibial artery. Skin staples  are noted about the medial aspect of the left lower leg with associated scattered foci of subcutaneous. IMPRESSION: Distal anastomosis presumably reverse venous distal femoral bypass grafting appears widely patent, however there are mixed occlusive and nonocclusive filling defects with tibioperoneal trunk and apparent single-vessel runoff to the left lower leg via the posterior tibial artery. Electronically Signed   By: Sandi Mariscal M.D.   On: 12/01/2017 14:18     CBC Recent Labs  Lab 12/20/17 0447 12/21/17 0248 12/22/17 0359  WBC 8.1 9.0 8.4  HGB 8.6* 8.3* 8.9*  HCT 28.0* 27.0* 28.7*  PLT 417* 399 453*  MCV 94.0 93.8 93.5  MCH 28.9 28.8 29.0  MCHC 30.7 30.7 31.0  RDW 13.0 13.1 13.1    Chemistries  Recent Labs  Lab 12/22/17 0359 12/23/17 0431 12/24/17 1417 12/25/17 0542 12/26/17 0344  NA 139 141 142 138 140  K 4.2 4.0 3.1* 4.3 4.0  CL 109 112* 119* 111 111  CO2 18* 18* 17* 18* 20*  GLUCOSE 90 90 124* 92 113*  BUN 12 10 8 8  5*  CREATININE 1.58* 1.37* 1.04 1.31* 1.16  CALCIUM 8.7* 8.6* 7.0* 8.5* 8.6*   ------------------------------------------------------------------------------------------------------------------ No results for input(s): CHOL, HDL, LDLCALC, TRIG, CHOLHDL, LDLDIRECT in the last 72 hours.  Lab Results  Component Value Date   HGBA1C 5.0 11/13/2017   ------------------------------------------------------------------------------------------------------------------ No results for input(s): TSH, T4TOTAL, T3FREE, THYROIDAB in the last 72 hours.  Invalid input(s): FREET3 ------------------------------------------------------------------------------------------------------------------ No results for input(s): VITAMINB12, FOLATE, FERRITIN, TIBC, IRON, RETICCTPCT in the last 72 hours.  Coagulation profile No results for input(s): INR, PROTIME in  the last 168 hours.  No results for input(s): DDIMER in the last 72 hours.  Cardiac Enzymes No results for  input(s): CKMB, TROPONINI, MYOGLOBIN in the last 168 hours.  Invalid input(s): CK ------------------------------------------------------------------------------------------------------------------ No results found for: BNP   Roxan Hockey M.D on 12/26/2017 at 3:10 PM   Go to [email or web address] - password [email or web address] for contact info  [facility] - Office  [phone]       ]

## 2017-12-26 NOTE — Evaluation (Signed)
Physical Therapy Evaluation Patient Details Name: Tony [name] MRN: [id number] DOB: 1965/12/09 Today's Date: 12/26/2017   History of Present Illness  52 year old man PMH critical limb ischemia, status post aortobifemoral bypass, right femoral artery to popliteal bypass, status post thrombectomy left limb aVF bypass.  Discharged on Xarelto.  Presented 7/8 with drainage from left groin wound and left below-knee incision.  Admitted by vascular surgery and started on IV antibiotics.  Because of neck pain, further imaging was obtained which revealed spiculated lung mass concerning for malignancy, patient transferred to hospitalist service.  Clinical Impression  Pt admitted with above diagnosis. Pt currently with functional limitations due to the deficits listed below (see PT Problem List). Pt can ambulate with RW with min guard assist. Works through pain.  Pt anxious to get pain under control so that he can get evaluated by oncology.  Pt family asking if oncology could see pt while here or transfer to [facility] for evaluation?  MD please address.   Pt will benefit from skilled PT to increase their independence and safety with mobility to allow discharge to the venue listed below.    Follow Up Recommendations Home health PT;Supervision/Assistance - 24 hour(wife wants to take pt home)    Equipment Recommendations  None recommended by PT    Recommendations for Other Services OT consult     Precautions / Restrictions Precautions Precautions: Fall Precaution Comments: R neck and arm edema, gangrenous R toes Restrictions Weight Bearing Restrictions: No      Mobility  Bed Mobility Overal bed mobility: Needs Assistance Bed Mobility: Supine to Sit;Sit to Supine     Supine to sit: Supervision;HOB elevated Sit to supine: Supervision;HOB elevated   General bed mobility comments: HOB up, incr time to come to EOB  Transfers Overall transfer level: Needs assistance Equipment used: Rolling  walker (2 wheeled) Transfers: Sit to/from Stand Sit to Stand: Min guard         General transfer comment: Min Guard A for stability and safety  Ambulation/Gait Ambulation/Gait assistance: Counsellor (Feet): 125 Feet Assistive device: Rolling walker (2 wheeled) Gait Pattern/deviations: Decreased stance time - right;Decreased stride length;Trunk flexed;Step-to pattern;Decreased weight shift to right   Gait velocity interpretation: <1.31 ft/sec, indicative of household ambulator General Gait Details: limited distance due to pain .Good safety with RW just difficult to progress due to pt fatigue and pain.      Stairs            Wheelchair Mobility    Modified Rankin (Stroke Patients Only)       Balance Overall balance assessment: Needs assistance Sitting-balance support: No upper extremity supported;Feet supported Sitting balance-Leahy Scale: Good     Standing balance support: Bilateral upper extremity supported;During functional activity Standing balance-Leahy Scale: Poor Standing balance comment: relies on UE support or physical A                             Pertinent Vitals/Pain Pain Assessment: 0-10 Pain Score: 8  Pain Location: right UE and neck, right foot, left leg and ankle Pain Descriptors / Indicators: Aching;Grimacing;Guarding Pain Intervention(s): Limited activity within patient's tolerance;Monitored during session;Repositioned;Premedicated before session  O2 oon arrival on 2L O2 was 96%.  O2 93% on RA however pt felt SOB at end of walk on RA therefore replaced 2LO2.   Home Living Family/patient expects to be discharged to:: Private residence Living Arrangements: Spouse/significant other;Children Available Help at Discharge: Available 24  hours/day;Family Type of Home: House Home Access: Stairs to enter Entrance Stairs-Rails: Right Entrance Stairs-Number of Steps: 3 Home Layout: Two level;1/2 bath on main level;Able to live on  main level with bedroom/bathroom Home Equipment: Gilford Rile - 2 wheels;Cane - single point;Crutches;Bedside commode Additional Comments: Bed and full bath on 2nd floor. Pt spouse reports he would be able to sleep on couch and use 1/2 bath on main level if unable to navigate stairs in home    Prior Function Level of Independence: Independent with assistive device(s)         Comments: pt was ambulating with RW     Hand Dominance   Dominant Hand: Right    Extremity/Trunk Assessment   Upper Extremity Assessment Upper Extremity Assessment: Defer to OT evaluation    Lower Extremity Assessment Lower Extremity Assessment: Generalized weakness RLE Deficits / Details: R toes gangrenous RLE: Unable to fully assess due to pain LLE Deficits / Details: limited by pain LLE: Unable to fully assess due to pain    Cervical / Trunk Assessment Cervical / Trunk Assessment: Other exceptions Cervical / Trunk Exceptions: guarding abdomen and maintaining flexed trunk  Communication   Communication: No difficulties  Cognition Arousal/Alertness: Lethargic Behavior During Therapy: Flat affect Overall Cognitive Status: Within Functional Limits for tasks assessed Area of Impairment: Safety/judgement;Memory                 Orientation Level: Disoriented to;Time   Memory: Decreased short-term memory Following Commands: Follows one step commands with increased time Safety/Judgement: Decreased awareness of safety     General Comments: pt with minimal conversation      General Comments      Exercises General Exercises - Lower Extremity Ankle Circles/Pumps: AROM;Both;10 reps Long Arc Quad: AROM;Both;5 reps;Seated   Assessment/Plan    PT Assessment Patient needs continued PT services  PT Problem List Decreased strength;Decreased mobility;Pain;Decreased knowledge of use of DME;Decreased activity tolerance;Decreased range of motion;Decreased balance       PT Treatment Interventions DME  instruction;Functional mobility training;Balance training;Patient/family education;Gait training;Therapeutic activities;Stair training;Therapeutic exercise    PT Goals (Current goals can be found in the Care Plan section)  Acute Rehab PT Goals Patient Stated Goal: have pain under control before return home PT Goal Formulation: With patient Time For Goal Achievement: 01/09/18 Potential to Achieve Goals: Good    Frequency Min 3X/week   Barriers to discharge        Co-evaluation               AM-PAC PT "6 Clicks" Daily Activity  Outcome Measure Difficulty turning over in bed (including adjusting bedclothes, sheets and blankets)?: None Difficulty moving from lying on back to sitting on the side of the bed? : None Difficulty sitting down on and standing up from a chair with arms (e.g., wheelchair, bedside commode, etc,.)?: A Little Help needed moving to and from a bed to chair (including a wheelchair)?: A Little Help needed walking in hospital room?: A Little Help needed climbing 3-5 steps with a railing? : A Lot 6 Click Score: 19    End of Session Equipment Utilized During Treatment: Gait belt Activity Tolerance: Patient limited by pain;Patient limited by fatigue Patient left: in bed;with call bell/phone within reach;with family/visitor present Nurse Communication: Mobility status PT Visit Diagnosis: Other abnormalities of gait and mobility (R26.89);Pain Pain - Right/Left: (bilaterally) Pain - part of body: Leg;Ankle and joints of foot(right UE and neck)    Time: 1610-9604 PT Time Calculation (min) (ACUTE ONLY): 30 min  Charges:   PT Evaluation $PT Eval Moderate Complexity: 1 Mod PT Treatments $Gait Training: 8-22 mins   PT G Codes:        Jomari Bartnik,PT Acute Rehabilitation [phone] (pager)   [name] 12/26/2017, 2:05 PM

## 2017-12-27 ENCOUNTER — Inpatient Hospital Stay (HOSPITAL_COMMUNITY): Payer: BLUE CROSS/BLUE SHIELD

## 2017-12-27 DIAGNOSIS — I10 Essential (primary) hypertension: Secondary | ICD-10-CM

## 2017-12-27 DIAGNOSIS — Z87891 Personal history of nicotine dependence: Secondary | ICD-10-CM

## 2017-12-27 DIAGNOSIS — C3411 Malignant neoplasm of upper lobe, right bronchus or lung: Secondary | ICD-10-CM

## 2017-12-27 DIAGNOSIS — I82401 Acute embolism and thrombosis of unspecified deep veins of right lower extremity: Secondary | ICD-10-CM

## 2017-12-27 DIAGNOSIS — Z7901 Long term (current) use of anticoagulants: Secondary | ICD-10-CM

## 2017-12-27 DIAGNOSIS — L02214 Cutaneous abscess of groin: Secondary | ICD-10-CM

## 2017-12-27 LAB — CANCER ANTIGEN 19-9: CA 19-9: 3 U/mL (ref 0–35)

## 2017-12-27 LAB — CEA: CEA1: 36.9 ng/mL — AB (ref 0.0–4.7)

## 2017-12-27 MED ORDER — SODIUM CHLORIDE 0.9 % IV SOLN
INTRAVENOUS | Status: DC | PRN
  Administered 2018-01-08 – 2018-01-09 (×2): 250 mL via INTRAVENOUS

## 2017-12-27 MED ORDER — SODIUM CHLORIDE 0.9% FLUSH: 9.0000 mL | INTRAVENOUS | Status: DC | PRN

## 2017-12-27 MED ORDER — HYDROMORPHONE HCL 1 MG/ML IJ SOLN
1.0000 mg | INTRAMUSCULAR | Status: AC
  Administered 2017-12-27: 1 mg via INTRAVENOUS

## 2017-12-27 MED ORDER — NALOXONE HCL 0.4 MG/ML IJ SOLN: 0.4000 mg | INTRAMUSCULAR | Status: DC | PRN

## 2017-12-27 MED ORDER — DIPHENHYDRAMINE HCL 12.5 MG/5ML PO ELIX
12.5000 mg | ORAL_SOLUTION | Freq: Four times a day (QID) | ORAL | Status: DC | PRN
  Filled 2017-12-27: qty 5

## 2017-12-27 MED ORDER — FENTANYL 25 MCG/HR TD PT72
50.0000 ug | MEDICATED_PATCH | TRANSDERMAL | Status: DC
  Administered 2017-12-27: 50 ug via TRANSDERMAL
  Filled 2017-12-27: qty 2

## 2017-12-27 MED ORDER — HYDROMORPHONE 1 MG/ML IV SOLN
INTRAVENOUS | Status: DC
  Administered 2017-12-27: 25 mg via INTRAVENOUS
  Filled 2017-12-27: qty 25

## 2017-12-27 MED ORDER — LIDOCAINE 5 % EX PTCH
1.0000 | MEDICATED_PATCH | CUTANEOUS | Status: DC
  Administered 2017-12-28 – 2018-01-22 (×26): 1 via TRANSDERMAL
  Filled 2017-12-27 (×28): qty 1

## 2017-12-27 MED ORDER — DIPHENHYDRAMINE HCL 50 MG/ML IJ SOLN: 12.5000 mg | Freq: Four times a day (QID) | INTRAMUSCULAR | Status: DC | PRN

## 2017-12-27 MED ORDER — LIDOCAINE 5 % EX PTCH
1.0000 | MEDICATED_PATCH | CUTANEOUS | Status: DC
  Administered 2017-12-27: 1 via TRANSDERMAL
  Filled 2017-12-27: qty 1

## 2017-12-27 MED ORDER — HYDROMORPHONE HCL 1 MG/ML IJ SOLN
0.5000 mg | INTRAMUSCULAR | Status: AC | PRN
  Administered 2017-12-27 – 2017-12-28 (×7): 1 mg via INTRAVENOUS
  Filled 2017-12-27 (×7): qty 1

## 2017-12-27 MED ORDER — MIRTAZAPINE 15 MG PO TABS
15.0000 mg | ORAL_TABLET | Freq: Every day | ORAL | Status: DC
  Administered 2017-12-27 – 2018-01-10 (×15): 15 mg via ORAL
  Filled 2017-12-27 (×15): qty 1

## 2017-12-27 MED ORDER — HYDROMORPHONE HCL 2 MG PO TABS
4.0000 mg | ORAL_TABLET | ORAL | Status: AC | PRN
  Administered 2017-12-28 (×3): 4 mg via ORAL
  Filled 2017-12-27 (×6): qty 2

## 2017-12-27 NOTE — Plan of Care (Signed)
  Problem: Pain Managment: Goal: General experience of comfort will improve 12/27/2017 0508 by Lajoyce Corners, RN Outcome: Progressing 12/27/2017 0508 by Lajoyce Corners, RN Outcome: Progressing  Patient's pain control is difficult. At night, his pain experience seems to be worse. Alternating between PO dilaudid 4mg  and IV dilaudid 1mg  to gain relief with less than satisfactory results for several hours into the night. The lowest pain score of the night is 6/10. This is pain in his right neck area.

## 2017-12-27 NOTE — Progress Notes (Signed)
SLP Cancellation Note  Patient Details Name: Tony [name] MRN: [id number] DOB: 11-Jul-1965   Cancelled treatment:  Attempted to see pt for swallowing evaluation.  Spoke with RN.  There is no concern for aspiration at this time; however, pt does c/o odynophagia.  Per Palliative NP note, "He has poor appetite and complains of pain when swallowing - recommend SLP evaluation after GI studies."  RN reports that pt declined BaSW this morning, but she will provide additional education and reattempt.  SLP will hold until necessary GI evaluation has been completed.          Celedonio Savage, MA, CCC-SLP 12/27/2017, 9:40 AM

## 2017-12-27 NOTE — Progress Notes (Signed)
Daily Progress Note   Patient Name: Tony [name]       Date: 12/27/2017 DOB: 10-13-1965  Age: 52 y.o. MRN#: [id number] Attending Physician: Roxan Hockey, MD Primary Care Physician: [name], MD Admit Date: 12/17/2017  Reason for Consultation/Follow-up: Pain control  Subjective: Tony [name] is sitting up in bed. Still in extreme pain. Throat pain/soreness gone today and appetite improved he reports.   Length of Stay: 10  Current Medications: Scheduled Meds:  . acetaminophen  1,000 mg Oral TID  . amoxicillin-clavulanate  1 tablet Oral Q12H  . atorvastatin  40 mg Oral Daily  . dronabinol  2.5 mg Oral BID AC  . escitalopram  20 mg Oral Daily  . feeding supplement  1 Container Oral TID BM  . fondaparinux (ARIXTRA) injection  7.5 mg Subcutaneous q1800  . gabapentin  300 mg Oral BID  . HYDROmorphone   Intravenous Q4H  . lidocaine  1 patch Transdermal Q24H  . mirtazapine  15 mg Oral QHS  . ondansetron  4 mg Oral Q6H  . pantoprazole  40 mg Oral Daily  . senna  1 tablet Oral QHS  . sodium chloride flush  3 mL Intravenous Q12H    Continuous Infusions: . sodium chloride Stopped (12/25/17 2338)    PRN Meds: sodium chloride, alum & mag hydroxide-simeth, bisacodyl, diphenhydrAMINE **OR** diphenhydrAMINE, guaiFENesin-dextromethorphan, hydrALAZINE, HYDROmorphone (DILAUDID) injection, HYDROmorphone, labetalol, LORazepam, naloxone **AND** sodium chloride flush, phenol, polyethylene glycol, promethazine, sodium chloride flush  Physical Exam  Constitutional: He is oriented to person, place, and time. He appears well-developed.  HENT:  Head: Atraumatic.  Cardiovascular: Normal rate.  Pulmonary/Chest: Effort normal. No accessory muscle usage. No tachypnea. No respiratory distress.    Abdominal: Normal appearance.  Neurological: He is alert and oriented to person, place, and time.  Nursing note and vitals reviewed.           Vital Signs: BP (!) 160/96 (BP Location: Left Arm)   Pulse 81   Temp 98.1 F (36.7 C) (Oral)   Resp 16   Ht '6\' 5"'$  (1.956 m)   Wt 71.6 kg (157 lb 13.6 oz)   SpO2 96%   BMI 18.72 kg/m  SpO2: SpO2: 96 % O2 Device: O2 Device: Room Air O2 Flow Rate: O2 Flow Rate (L/min): 2 L/min  Intake/output summary:   Intake/Output  Summary (Last 24 hours) at 12/27/2017 1036 Last data filed at 12/27/2017 8937 Gross per 24 hour  Intake 723 ml  Output 700 ml  Net 23 ml   LBM: Last BM Date: 12/26/17 Baseline Weight: Weight: 71.6 kg (157 lb 13.6 oz) Most recent weight: Weight: 71.6 kg (157 lb 13.6 oz)       Palliative Assessment/Data: 50%     Patient Active Problem List   Diagnosis Date Noted  . Uncontrolled pain   . Palliative care encounter   . Adenocarcinoma ([place]) 12/25/2017  . Gangrene of right foot ([place]) 12/20/2017  . AKI (acute kidney injury) ([place]) 12/20/2017  . PAD (peripheral artery disease) ([place]) 12/19/2017  . S/P aortobifemoral bypass surgery 12/17/2017  . Lung mass 12/17/2017  . Postoperative wound infection 12/17/2017  . Weight loss 12/17/2017  . Cervical lymphadenopathy 12/17/2017  . DVT (deep venous thrombosis) ([place]) 12/17/2017  . Aortoiliac occlusive disease ([place]) 11/15/2017  . Pain of right lower extremity due to ischemia 11/13/2017  . Hyperlipidemia LDL goal <70 11/13/2017  . PVD (peripheral vascular disease) ([place]) 11/11/2017  . H. pylori infection 10/11/2011  . Duodenal ulcer 08/23/2011  . IBS (irritable bowel syndrome) 08/18/2011  . Depression with anxiety 05/11/2009  . EPIGASTRIC PAIN, CHRONIC 05/11/2009  . SMOKER 09/01/2008  . INSOMNIA, CHRONIC 09/01/2008  . Essential hypertension 09/01/2008  . PALPITATIONS, CHRONIC 09/01/2008  . GASTRITIS 10/24/2007    Palliative Care Assessment & Plan   HPI: 52 y.o. male   with past medical history of HTN, HLD, arterial occlusive disease, PVD, IBS,  chronic back pain, anxiety, depression admitted on 12/17/2017 with increased drainage from incision sites and with critical limb ischemia of left lower extremity. S/P  Right Aortobifemoral bypass and fem-pop bypass 6/6 and found to thrombectomy with fem-pop bypass on left 6/22. Hospitalization complicated by uncontrolled pain likely s/t newly diagnosed metastatic adenocarcinoma with significant lymphadenopathy to neck. Also with venous thrombus of right subclavian, brachiocephalic, and IJ veins.   Assessment: I met today with Tony [name]. He is more alert and engaging with me today. Reports that he continues to be in extreme pain. I discussed plan for PCA in order to better assess pain medication needs to allow better control and transition to long acting pill or patch. He agrees. Pain 8/10 right neck (appears with increased swelling and discoloration today).   Late entry: I was called back to bedside by RN. Spoke with Tony [name] who is upset as he is extremely anxious about using PCA. He shares that he became very confused earlier in hospital stay with PCA and was confused, agitated, and cursing at people. He is very disturbed and scared that this could happen again. After much deliberation we decided to d/c PCA and continue with po/IV dilaudid as previous and to consider Fentanyl patch for management. I provided reassurance that he helps decide the plan to manage his healthcare (he kept apologizing for not wanting PCA). Emotional support provided.   Late entry: I returned to beside briefly after 4PM when wife expected at bedside. Discussed pain plan with both of them and will initiate Fentanyl patch and trial Lidoderm patch tonight. Reinforced that nothing is going to work quickly and to be patient as we work through this pain. I assured them we will continue working with them to make his pain better but this will likely take some time. Also  discussed GI studies and they both agree to proceed. All questions/concerns addressed. Emotional support provided.   Tony [name] still has extreme pain  BUT appears more functional today (able to move around in bed and trying to stand briefly to readjust). He is also eating better today and asking for food. He does agree that we are making some progress and says he slept well last night.   Recommendations/Plan:  Poor intake: Boost/Breeze TID. Continue Marinol trial. Remeron added as well. Improved!  Bowel Regimen: Senokot qhs.   Right neck pain (r/t cancer):  ? Ice pack 15 min every 4 hours.  ? Tylenol 1000 mg po TID.  ? Dilaudid po 4 mg every 3 hours prn severe pain.  ? Dilaudid IV 1 mg every 2 hours prn breakthrough pain.  ? Continue gabapentin 300 BID.  ? Added Lidoderm patch to right neck.  ? Added Fentanyl patch 50 mcg/hr. Will likely need increased titration in 24-48 hours.  ? Consider decadron if infection allows   Code Status:  Full code  Prognosis:   Overall poor prognosis with stage IV cancer newly diagnosed and gangrene to right foot. Attempt to better manage pain/symptoms to see if functional status may improve.   Discharge Planning:  To Be Determined  Care plan was discussed with patient, family, RN, Dr. Denton Brick. Also discussed pain management plans with Dr. Domingo Cocking.   Thank you for allowing the Palliative Medicine Team to assist in the care of this patient.   Time In/Out: 1020-1045, 1410-1435,  1610-9604 Total Time 80 min Prolonged Time Billed  yes       Greater than 50%  of this time was spent counseling and coordinating care related to the above assessment and plan.  Vinie Sill, NP Palliative Medicine Team Pager # [phone] (M-F 8a-5p) Team Phone # [phone] (Nights/Weekends)

## 2017-12-27 NOTE — Progress Notes (Signed)
Marland Kitchen   HEMATOLOGY/ONCOLOGY INPATIENT PROGRESS NOTE  Date of Service: 12/27/2017  Inpatient Attending: .Roxan Hockey, MD   SUBJECTIVE  Patient was seen in followup for newly diagnosed metastatic poorly differentiated adenocarcinoma off unknown primary but thought to be likely lung. He is pending an esopgram and UGI series to r/o overt esophageal or gastric primary. Neck pain is better controlled. On anticoagulation for DVT.   OBJECTIVE:  Mild distress from neck discomfort. Flat affect.  PHYSICAL EXAMINATION: . Vitals:   12/27/17 0340 12/27/17 1147 12/27/17 1224 12/27/17 2126  BP: (!) 160/96 (!) 180/78  (!) 170/96  Pulse: 81 97  (!) 107  Resp:  18 18   Temp: 98.1 F (36.7 C) 98.1 F (36.7 C)  98 F (36.7 C)  TempSrc: Oral Oral  Oral  SpO2: 96% 95% 98% 95%  Weight:      Height:       Filed Weights   12/18/17 1613  Weight: 157 lb 13.6 oz (71.6 kg)   .Body mass index is 18.72 kg/m.  GENERAL:alert EYES: normal, conjunctiva are pink and non-injected, sclera clear OROPHARYNX:no exudate, no erythema and lips, buccal mucosa, and tongue normal  NECK: supple, mild JVD,significant neck swelling from Lymphadenopathy LYMPH:  b/l cervical bulky lymph-adenopathy, small axillary LN noted LUNGS: clear to auscultation  HEART: regular rate & rhythm, b/l 2+ lower extremity edema with color changes in toes ABDOMEN: abdomen soft, non-tender, normoactive bowel sounds  PSYCH: alert & oriented x 3 with fluent speech NEURO: no focal motor/sensory deficits  MEDICAL HISTORY:  Past Medical History:  Diagnosis Date  . Anxiety   . Arterial occlusive disease    multilevel arterial occlusive disease/notes 11/13/2017  . Arthritis    "left knee" (11/13/2017)  . Chronic back pain   . Chronic lower back pain   . Depression   . Gastric ulcer due to Helicobacter pylori 4650  . GERD (gastroesophageal reflux disease)   . High cholesterol   . Hypertension   . IBS (irritable bowel syndrome)   .  Mesenteric adenitis 2011   presumed/H&P  . Migraines    "use to suffer migraines years ago" (11/13/2017)  . Peripheral vascular disease ([place])   . Pneumonia 2011  . Ulcer     SURGICAL HISTORY: Past Surgical History:  Procedure Laterality Date  . AORTA - BILATERAL FEMORAL ARTERY BYPASS GRAFT Bilateral 11/15/2017   Procedure: AORTA BIFEMORAL BYPASS GRAFT;  Surgeon: Angelia Mould, MD;  Location: [place];  Service: Vascular;  Laterality: Bilateral;  . BACK SURGERY  X 4   "procedure where they burn the nerves every 6 months"  . EMBOLECTOMY Left 12/01/2017   Procedure: THROMBECTOMY OF LEFT AORTAFEMORAL BYPASS, THROMBECTOMY OF TIBIAL ARTERY;  Surgeon: Rosetta Posner, MD;  Location: [facility] OR;  Service: Vascular;  Laterality: Left;  . ESOPHAGOGASTRODUODENOSCOPY  08/22/2011   Procedure: ESOPHAGOGASTRODUODENOSCOPY (EGD);  Surgeon: [name], MD;  Location: [place];  Service: Gastroenterology;  Laterality: N/A;  . FEMORAL-POPLITEAL BYPASS GRAFT Right 11/15/2017   Procedure: Right FEMORAL-Above knee POPLITEAL ARTERY Bypass Graft using nonreversed saphenous vein ;  Surgeon: Angelia Mould, MD;  Location: [place];  Service: Vascular;  Laterality: Right;  . FEMORAL-POPLITEAL BYPASS GRAFT Left 12/01/2017   Procedure: BYPASS GRAFT FEMORAL- ABOVE KNEE POPLITEAL ARTERY WITH VEIN;  Surgeon: Rosetta Posner, MD;  Location: [place];  Service: Vascular;  Laterality: Left;  . INTRAOPERATIVE ARTERIOGRAM Right 11/15/2017   Procedure: INTRA OPERATIVE ARTERIOGRAM;  Surgeon: Angelia Mould, MD;  Location: [facility]  OR;  Service: Vascular;  Laterality: Right;  . INTRAOPERATIVE ARTERIOGRAM Left 12/01/2017   Procedure: INTRA OPERATIVE ARTERIOGRAM OF LEFT LEG;  Surgeon: Rosetta Posner, MD;  Location: [place];  Service: Vascular;  Laterality: Left;  . IR US GUIDE BX ASP/DRAIN  12/18/2017  . KNEE ARTHROSCOPY Left X 5    SOCIAL HISTORY: Social History   Socioeconomic History  . Marital status: Married    Spouse name:  Not on file  . Number of children: 3  . Years of education: Not on file  . Highest education level: Not on file  Occupational History  . Occupation: Admin. Assistant    Employer: [employer]  Social Needs  . Financial resource strain: Not on file  . Food insecurity:    Worry: Not on file    Inability: Not on file  . Transportation needs:    Medical: Not on file    Non-medical: Not on file  Tobacco Use  . Smoking status: Former Smoker    Packs/day: 1.00    Years: 24.00    Pack years: 24.00    Types: Cigarettes    Start date: 11/28/2017    Last attempt to quit: 12/04/2017    Years since quitting: 0.0  . Smokeless tobacco: Never Used  Substance and Sexual Activity  . Alcohol use: Yes    Comment: 11/13/2017 "did drink wine q  1-2 months; nothing anymore"   . Drug use: Not Currently    Types: Marijuana    Comment: 11/13/2017  "last drug use was in ~ 1987 "  . Sexual activity: Not on file  Lifestyle  . Physical activity:    Days per week: Not on file    Minutes per session: Not on file  . Stress: Not on file  Relationships  . Social connections:    Talks on phone: Not on file    Gets together: Not on file    Attends religious service: Not on file    Active member of club or organization: Not on file    Attends meetings of clubs or organizations: Not on file    Relationship status: Not on file  . Intimate partner violence:    Fear of current or ex partner: Not on file    Emotionally abused: Not on file    Physically abused: Not on file    Forced sexual activity: Not on file  Other Topics Concern  . Not on file  Social History Narrative   Lives with wife and two children.  Administrator at [employer]: Family History  Problem Relation Age of Onset  . Hypertension Mother   . Hypertension Father   . Coronary artery disease Father 11  . Heart attack Brother 64  . Coronary artery disease Brother   . Diabetes Neg Hx   . Stroke Neg Hx     ALLERGIES:  is  allergic to pork-derived products; shellfish-derived products; and morphine and related.  MEDICATIONS:  Scheduled Meds: . acetaminophen  1,000 mg Oral TID  . amoxicillin-clavulanate  1 tablet Oral Q12H  . atorvastatin  40 mg Oral Daily  . dronabinol  2.5 mg Oral BID AC  . escitalopram  20 mg Oral Daily  . feeding supplement  1 Container Oral TID BM  . fentaNYL  50 mcg Transdermal Q72H  . fondaparinux (ARIXTRA) injection  7.5 mg Subcutaneous q1800  . gabapentin  300 mg Oral BID  . [START ON 12/28/2017] lidocaine  1 patch Transdermal  Q24H  . mirtazapine  15 mg Oral QHS  . ondansetron  4 mg Oral Q6H  . pantoprazole  40 mg Oral Daily  . senna  1 tablet Oral QHS  . sodium chloride flush  3 mL Intravenous Q12H   Continuous Infusions: . sodium chloride Stopped (12/25/17 2338)  . sodium chloride     PRN Meds:.sodium chloride, sodium chloride, alum & mag hydroxide-simeth, bisacodyl, guaiFENesin-dextromethorphan, hydrALAZINE, HYDROmorphone (DILAUDID) injection, HYDROmorphone, labetalol, LORazepam, phenol, polyethylene glycol, promethazine, sodium chloride flush  REVIEW OF SYSTEMS:    10 Point review of Systems was done is negative except as noted above.   LABORATORY DATA:  I have reviewed the data as listed  . CBC Latest Ref Rng & Units 12/22/2017 12/21/2017 12/20/2017  WBC 4.0 - 10.5 K/uL 8.4 9.0 8.1  Hemoglobin 13.0 - 17.0 g/dL 8.9(L) 8.3(L) 8.6(L)  Hematocrit 39.0 - 52.0 % 28.7(L) 27.0(L) 28.0(L)  Platelets 150 - 400 K/uL 453(H) 399 417(H)   . CBC    Component Value Date/Time   WBC 8.4 12/22/2017 0359   RBC 3.07 (L) 12/22/2017 0359   HGB 8.9 (L) 12/22/2017 0359   HCT 28.7 (L) 12/22/2017 0359   PLT 453 (H) 12/22/2017 0359   MCV 93.5 12/22/2017 0359   MCH 29.0 12/22/2017 0359   MCHC 31.0 12/22/2017 0359   RDW 13.1 12/22/2017 0359   LYMPHSABS 0.8 12/17/2017 1207   MONOABS 0.6 12/17/2017 1207   EOSABS 0.2 12/17/2017 1207   BASOSABS 0.1 12/17/2017 1207    CMP Latest Ref  Rng & Units 12/26/2017 12/25/2017 12/24/2017  Glucose 70 - 99 mg/dL 113(H) 92 124(H)  BUN 6 - 20 mg/dL 5(L) 8 8  Creatinine 0.61 - 1.24 mg/dL 1.16 1.31(H) 1.04  Sodium 135 - 145 mmol/L 140 138 142  Potassium 3.5 - 5.1 mmol/L 4.0 4.3 3.1(L)  Chloride 98 - 111 mmol/L 111 111 119(H)  CO2 22 - 32 mmol/L 20(L) 18(L) 17(L)  Calcium 8.9 - 10.3 mg/dL 8.6(L) 8.5(L) 7.0(L)  Total Protein 6.5 - 8.1 g/dL - - -  Total Bilirubin 0.3 - 1.2 mg/dL - - -  Alkaline Phos 38 - 126 U/L - - -  AST 15 - 41 U/L - - -  ALT 0 - 44 U/L - - -    RADIOGRAPHIC STUDIES: I have personally reviewed the radiological images as listed and agreed with the findings in the report. Ct Soft Tissue Neck W Contrast  Result Date: 12/17/2017 CLINICAL DATA:  52 y/o M; recently removed right neck PICC line with swelling and pain in the region of PICC line. EXAM: CT NECK WITH CONTRAST TECHNIQUE: Multidetector CT imaging of the neck was performed using the standard protocol following the bolus administration of intravenous contrast. CONTRAST:  100 cc Omnipaque 300 COMPARISON:  None. FINDINGS: Pharynx and larynx: Normal. No mass or swelling. Salivary glands: No inflammation, mass, or stone. Thyroid: Normal. Lymph nodes: Right cervical, bilateral supraclavicular, and upper mediastinal bulky lymphadenopathy. A right upper paratracheal lymph node measures 5.0 x 3.5 cm (series 3, image 109), left supraclavicular node measures 3.1 x 1.7 cm (series 3, image 85), and a right supraclavicular node measures 1.6 x 2.1 cm (series 3, image 68). Vascular: Deep venous thrombosis involving right subclavian vein, right brachiocephalic vein, and right internal jugular vein. Limited intracranial: Negative. Visualized orbits: Negative. Mastoids and visualized paranasal sinuses: Small right maxillary sinus mucous retention cyst. Skeleton: No acute or aggressive process. Upper chest: Spiculated nodule in the right upper lobe measuring 16 x  12 mm (series 3, image 115).  Other: Mild edema in the right supraclavicular fossa and lower neck. IMPRESSION: 1. Deep venous thrombosis involving the right subclavian vein, right brachiocephalic vein, and right internal jugular vein. 2. Edema in the right lower neck and supraclavicular fossa is probably related to venous congestion from DVT and/or phlebitis. No abscess. 3. Severe bulky lymphadenopathy of right cervical, bilateral supraclavicular, and upper mediastinal lymph nodes likely representing metastatic disease or lymphoproliferative disease. 4. Spiculated right upper lobe nodule measuring up to 16 mm. Consider one of the following in 3 months for both low-risk and high-risk individuals: (a) repeat chest CT, (b) follow-up PET-CT, or (c) tissue sampling. This recommendation follows the consensus statement: Guidelines for Management of Incidental Pulmonary Nodules Detected on CT Images: From the Fleischner Society 2017; Radiology 2017; 284:228-243. These results will be called to the ordering clinician or representative by the Radiologist Assistant, and communication documented in the PACS or zVision Dashboard. Electronically Signed   By: Kristine Garbe M.D.   On: 12/17/2017 15:04   Ct Chest W Contrast  Result Date: 12/25/2017 CLINICAL DATA:  Spiculated lung mass and adenopathy shown on neck CT, for further workup. EXAM: CT CHEST, ABDOMEN, AND PELVIS WITH CONTRAST TECHNIQUE: Multidetector CT imaging of the chest, abdomen and pelvis was performed following the standard protocol during bolus administration of intravenous contrast. CONTRAST:  13m OMNIPAQUE IOHEXOL 300 MG/ML  SOLN COMPARISON:  12/17/2017 and CT abdomen from 08/14/2011 there is some mild fatty infiltration in segment IV adjacent to the falciform ligament. Otherwise unremarkable. FINDINGS: CT CHEST FINDINGS Cardiovascular: Aortic arch and branch vessel atherosclerotic vascular disease. Poor visualization of contrast in the right internal jugular vein, compatible  with patient's known venous thrombosis. Mediastinum/Nodes: Bilateral lower neck level IV adenopathy with ill definition. A left level Roman numeral 4 lymph node measures 2.0 cm in short axis on image 2/3. There is also bilateral supraclavicular adenopathy including a 1.9 cm right-sided supraclavicular node on image 7/3. Bulky paratracheal, prevascular, subcarinal, and right hilar adenopathy noted. An index right paratracheal node measures 3.6 cm in short axis on image 16/3. Enlarged right subpectoral lymph nodes are present along with scattered right axillary lymph nodes. Mild stranding in the anterior mediastinum. Lungs/Pleura: Moderate right and small left pleural effusion are present and are nonspecific for transudative versus exudative etiology. A spiculated right upper lobe nodule measures 2.4 by 1.3 by 1.4 cm, roughly stable from 12/17/2017. There is passive atelectasis in both lower lobes, right greater than left. Paraseptal emphysema noted. Musculoskeletal: Notable subcutaneous edema throughout the chest, somewhat confluent along the right upper chest. CT ABDOMEN PELVIS FINDINGS Hepatobiliary: Unremarkable Pancreas: Unremarkable Spleen: Unremarkable Adrenals/Urinary Tract: Unremarkable Stomach/Bowel: Air-fluid levels in the rectum indicating diarrheal process. Borderline wall thickening several segments of proximal small bowel. Vascular/Lymphatic: Aortoiliac atherosclerotic vascular disease. Aortobifemoral graft small fluid collection superficial to the right groin likely representing residual hematoma from prior groin puncture, but only measuring about 2.4 by 2.1 by 4.3 cm (volume = 11 cm^3). No gas in this collection. Left inguinal lymph node 0.9 cm in short axis. Reproductive: Unremarkable Other: Diffuse subcutaneous edema. Small amount of pelvic ascites. Gas tracking along the anterior abdominal wall subcutaneous tissues, correlate with any anterior abdominal wall injections. Subcutaneous edema extends  along the pubis and towards the penis. Musculoskeletal: Unremarkable IMPRESSION: 1. Bulky adenopathy in the neck and upper chest. Persistent 2.4 cm spiculated right upper lobe nodule. The degree of adenopathy seems disproportionate to the size of the nodule, and this  could represent adenopathy related to lung cancer or potentially 2 separate processes such as lymphoproliferative disease and a separate primary lung cancer. An infectious cause for the pulmonary nodule is less likely. 2. Extensive third spacing of fluid in the chest, abdomen and pelvis. Query hypoproteinemia or hypoalbuminemia. 3. Poor visualization of contrast in the right internal jugular vein compatible with the patient's known venous thrombosis. 4. Air fluid levels in the rectum indicating diarrheal process. Borderline wall thickening in the proximal small bowel, query proximal enteritis. 5. The small fluid collection superficial to the right groin puncture site is only about 11 cc in volume, and probably reflects a resolving hematoma. 6. Gas tracking along anterior abdominal wall subcutaneous tissues, probably from injections-correlate with any anterior abdominal wall subcutaneous injections recently. Electronically Signed   By: Van Clines M.D.   On: 12/25/2017 00:05   Mr Jeri Cos QA Contrast  Result Date: 12/25/2017 CLINICAL DATA:  52 year old male with right upper extremity and right central venous DVT with bulky cervical and thoracic lymphadenopathy and spiculated right apical lung nodule. EXAM: MRI HEAD WITHOUT AND WITH CONTRAST TECHNIQUE: Multiplanar, multiecho pulse sequences of the brain and surrounding structures were obtained without and with intravenous contrast. CONTRAST:  70m MULTIHANCE GADOBENATE DIMEGLUMINE 529 MG/ML IV SOLN COMPARISON:  Neck CT 12/17/2017. Head CT without contrast 08/03/2006. Neck and chest CT FINDINGS: Brain: No midline shift, mass effect, or evidence of intracranial mass lesion. No abnormal enhancement  identified. No dural thickening. No restricted diffusion to suggest acute infarction. No ventriculomegaly, extra-axial collection or acute intracranial hemorrhage. Cervicomedullary junction and pituitary are within normal limits. GPearline Cablesand white matter signal is within normal limits for age throughout the brain; Minimal nonspecific left frontal lobe white matter T2 and FLAIR hyperintensity (series 6, image 19). No cortical encephalomalacia or chronic cerebral blood products. Vascular: Major intracranial vascular flow voids are preserved. The major dural venous sinuses are enhancing and appear patent. The right IJ bulb at the skull base is patent. Skull and upper cervical spine: Negative visible cervical spine and spinal cord. Visualized bone marrow signal is within normal limits. Sinuses/Orbits: Negative orbits soft tissues. Paranasal sinuses and mastoids are stable and well pneumatized. Other: Visible internal auditory structures appear normal. Partially visible bulky right neck lymphadenopathy (series 4, image 14). IMPRESSION: 1. No cerebral metastatic disease or acute intracranial abnormality identified. 2. Right cervical lymphadenopathy redemonstrated. Electronically Signed   By: HGenevie AnnM.D.   On: 12/25/2017 09:17   Ct Abdomen Pelvis W Contrast  Result Date: 12/25/2017 CLINICAL DATA:  Spiculated lung mass and adenopathy shown on neck CT, for further workup. EXAM: CT CHEST, ABDOMEN, AND PELVIS WITH CONTRAST TECHNIQUE: Multidetector CT imaging of the chest, abdomen and pelvis was performed following the standard protocol during bolus administration of intravenous contrast. CONTRAST:  1064mOMNIPAQUE IOHEXOL 300 MG/ML  SOLN COMPARISON:  12/17/2017 and CT abdomen from 08/14/2011 there is some mild fatty infiltration in segment IV adjacent to the falciform ligament. Otherwise unremarkable. FINDINGS: CT CHEST FINDINGS Cardiovascular: Aortic arch and branch vessel atherosclerotic vascular disease. Poor  visualization of contrast in the right internal jugular vein, compatible with patient's known venous thrombosis. Mediastinum/Nodes: Bilateral lower neck level IV adenopathy with ill definition. A left level Roman numeral 4 lymph node measures 2.0 cm in short axis on image 2/3. There is also bilateral supraclavicular adenopathy including a 1.9 cm right-sided supraclavicular node on image 7/3. Bulky paratracheal, prevascular, subcarinal, and right hilar adenopathy noted. An index right paratracheal node measures 3.6  cm in short axis on image 16/3. Enlarged right subpectoral lymph nodes are present along with scattered right axillary lymph nodes. Mild stranding in the anterior mediastinum. Lungs/Pleura: Moderate right and small left pleural effusion are present and are nonspecific for transudative versus exudative etiology. A spiculated right upper lobe nodule measures 2.4 by 1.3 by 1.4 cm, roughly stable from 12/17/2017. There is passive atelectasis in both lower lobes, right greater than left. Paraseptal emphysema noted. Musculoskeletal: Notable subcutaneous edema throughout the chest, somewhat confluent along the right upper chest. CT ABDOMEN PELVIS FINDINGS Hepatobiliary: Unremarkable Pancreas: Unremarkable Spleen: Unremarkable Adrenals/Urinary Tract: Unremarkable Stomach/Bowel: Air-fluid levels in the rectum indicating diarrheal process. Borderline wall thickening several segments of proximal small bowel. Vascular/Lymphatic: Aortoiliac atherosclerotic vascular disease. Aortobifemoral graft small fluid collection superficial to the right groin likely representing residual hematoma from prior groin puncture, but only measuring about 2.4 by 2.1 by 4.3 cm (volume = 11 cm^3). No gas in this collection. Left inguinal lymph node 0.9 cm in short axis. Reproductive: Unremarkable Other: Diffuse subcutaneous edema. Small amount of pelvic ascites. Gas tracking along the anterior abdominal wall subcutaneous tissues, correlate  with any anterior abdominal wall injections. Subcutaneous edema extends along the pubis and towards the penis. Musculoskeletal: Unremarkable IMPRESSION: 1. Bulky adenopathy in the neck and upper chest. Persistent 2.4 cm spiculated right upper lobe nodule. The degree of adenopathy seems disproportionate to the size of the nodule, and this could represent adenopathy related to lung cancer or potentially 2 separate processes such as lymphoproliferative disease and a separate primary lung cancer. An infectious cause for the pulmonary nodule is less likely. 2. Extensive third spacing of fluid in the chest, abdomen and pelvis. Query hypoproteinemia or hypoalbuminemia. 3. Poor visualization of contrast in the right internal jugular vein compatible with the patient's known venous thrombosis. 4. Air fluid levels in the rectum indicating diarrheal process. Borderline wall thickening in the proximal small bowel, query proximal enteritis. 5. The small fluid collection superficial to the right groin puncture site is only about 11 cc in volume, and probably reflects a resolving hematoma. 6. Gas tracking along anterior abdominal wall subcutaneous tissues, probably from injections-correlate with any anterior abdominal wall subcutaneous injections recently. Electronically Signed   By: Van Clines M.D.   On: 12/25/2017 00:05   Ir US Guide Bx Asp/drain  Result Date: 12/18/2017 INDICATION: 52 year old male with a history of neck mass EXAM: IR ULTRASOUND GUIDANCE MEDICATIONS: None. ANESTHESIA/SEDATION: Moderate (conscious) sedation was employed during this procedure. A total of Versed 2.0 mg and Fentanyl 100 mcg was administered intravenously. Moderate Sedation Time: 12 minutes. The patient's level of consciousness and vital signs were monitored continuously by radiology nursing throughout the procedure under my direct supervision. FLUOROSCOPY TIME:  None COMPLICATIONS: None PROCEDURE: Informed written consent was obtained  from the patient after a thorough discussion of the procedural risks, benefits and alternatives. All questions were addressed. Maximal Sterile Barrier Technique was utilized including caps, mask, sterile gowns, sterile gloves, sterile drape, hand hygiene and skin antiseptic. A timeout was performed prior to the initiation of the procedure. Patient positioned supine position on the ultrasound table. Ultrasound images of the right neck were performed with images stored sent to PACs. Patient is then prepped and draped in the usual sterile fashion. 1% lidocaine was used for local anesthesia. Using ultrasound guidance, multiple 18 gauge core biopsy performed of right neck mass. Tissue specimen placed in the saline. Sterile bandage was placed. Patient tolerated the procedure well and remained hemodynamically stable throughout.  No complications were encountered and no significant blood loss. IMPRESSION: Status post ultrasound-guided biopsy of right neck mass. Signed, Dulcy Fanny. Dellia Nims, RPVI [facility] Radiology Electronically Signed   By: Corrie Mckusick D.O.   On: 12/18/2017 17:00   Dg Ang/ext/uni/or Left  Result Date: 12/01/2017 CLINICAL DATA:  Intraoperative arteriogram of the left lower leg EXAM: LEFT ANG/EXT/UNI/ OR FLUOROSCOPY TIME:  Not provided COMPARISON:  None FINDINGS: A single spot intraoperative radiographic image of the left knee provided for review Intra-arterial contrast has been injected from a more proximal location. Images demonstrate the sequela of presumably reverse venous distal femoral bypass grafting. The distal anastomosis appears widely patent without hemodynamically significant stenosis. The distal most aspect of the left superficial femoral artery as well as the left above and below knee popliteal artery appear widely patent. There are mixed occlusive and nonocclusive filling defect within the tibioperoneal trunk with an apparent single  vessel runoff to the left lower leg via the posterior tibial artery. Skin staples are noted about the medial aspect of the left lower leg with associated scattered foci of subcutaneous. IMPRESSION: Distal anastomosis presumably reverse venous distal femoral bypass grafting appears widely patent, however there are mixed occlusive and nonocclusive filling defects with tibioperoneal trunk and apparent single-vessel runoff to the left lower leg via the posterior tibial artery. Electronically Signed   By: Sandi Mariscal M.D.   On: 12/01/2017 14:18    ASSESSMENT & PLAN:    52 yo male with   1. Right upper lobe nodule and extensive mediastinal and cervical lymphadenopathy concerning for metastatic lung cancer though other metastatic tumor possible. -Biopsy consistent with poorly differentiated adenocarcinoma likely lung or UGI primary CT abd no overt disease. MRI brain neg for metastatic disease  2. Metastatic cancer related hypercoagulable status   3. Extensive Deep venous thrombosis involving the right subclavian vein, right brachiocephalic vein, and right internal jugular vein - due to cancer + vascular surgery + surgical site infection  4. PAD with recent vascular bypass and rt foot gangrene  5. Cancer related pain  PLAN -patient is s/p biopsy of cervical LN - discussed pathology results -discussed CT neck/chest/abd/pelvis and MRI brain -- consistent with metastatic poorly differentiated adenocarcinoma likely lung primary vs UGI. Imaging not suggestive of alternative primary site. -Esophagogram and UGI series requested -pending to r/o overt esophageal or gastric lesion. -pain mx per palliative care/hospitalist at this time. (currently on PCA) -requested [employer] and PDL1 testing on pathology sample to determine palliative treatment options. -consult radiation oncology to determine consideration for possible palliative RT for symptoms control from neck and mediastinal masses. -patient  is aware that all treatments are palliative only and not curative. -on fondaparinux for anticoagulation -palliative chemotherapy /target therapy consideration would be as outpatient after resolution of infectious issues -palliative care following for ongoing goals of care.  5. Left groin would infection post-op - improving abx per hospitalist.  Will f/u in the hospital as needed and setup for outpatient f/u    I spent 25 minutes counseling the patient face to face. The total time spent in the appointment was 35 minutes and more than 50% was on counseling and direct patient cares.    Sullivan Lone MD [place] AAHIVMS [facility] Hematology/Oncology Physician [facility]  (Office):       [phone] (Work cell):  [phone] (Fax):           [phone]  12/27/2017 10:00 PM

## 2017-12-27 NOTE — Progress Notes (Signed)
Patient Demographics:    Tony [name], is a 52 y.o. male, DOB - 03-02-1966, ACZ:[id number]  Admit date - 12/17/2017   Admitting Physician Angelia Mould, MD  Outpatient Primary MD for the patient is [name], MD  LOS - 10   Chief Complaint  Patient presents with  . Post-op Problem        Subjective:    Tony [name] today has no fevers, no emesis,  No chest pain,  Pain control remains challenging, daughter at bedside, wife Therapist, music) on the phone, questions answered  Assessment  & Plan :    Principal Problem:   Adenocarcinoma ([place]) Active Problems:   S/P aortobifemoral bypass surgery   Lung mass   Postoperative wound infection   Cervical lymphadenopathy   DVT (deep venous thrombosis) ([facility])   PAD (peripheral artery disease) ([facility])   Gangrene of right foot ([place])   AKI (acute kidney injury) ([place])   Uncontrolled pain   Palliative care encounter  Brief Summary  52 year old man PMH critical limb ischemia, status post aortobifemoral bypass, right femoral artery to popliteal bypass, status post thrombectomy left limb aVF bypass admitted on 12/17/2017 with drainage from left groin wound and left below-knee incision and concerns about postop wound infection and cellulitis, patient also had neck pain imaging studies suggested a spiculated lung mass concerning for malignancy,    Plan:- 1)Poorly differentiated Adenocarcinoma of unclear primary--suspect lung cancer in a smoker (Rt upper lobe) , d/w Dr Irene Limbo  upper GI barium studies pending, patient will need to follow-up with oncology within a week of discharge to discuss treatment options post discharge from the hospital.  Palliative care consult to help with pain management appreciated,  spoke with Elmo Putt, NP  from palliative care services  2)Venous Thrombosis of the Right subclavian vein/right brachiocephalic vein/right internal jugular vein--as  per Dr. Irene Limbo treat empirically with fondaparinux (Arixtra)  3)Left groin postop wound infection- Improving, Augmentin as ordered, vascular surgery input appreciated  4)Acute "Malignancy" related  pain--patient has significant neck pain suspect due to significant adenopathy associated with malignancy, palliative care consult to help with pain management, discussed with Dr. Irene Limbo if radiation may be an option for pain control, start PCA pump  5)PAD with right foot gangrene--- per vascular surgery no need for aggressive approach,  await autoamputation, continue soaks in lukewarm dial soap water daily  6)Social--- plan of care discussed with patient, his daughter, and his wife [name]  7)Anorexia/Anxiety--- Remeron 50 mg nightly as ordered, continue Marinol 2.5 twice a day with food  Code Status : FULL CODE  Disposition Plan  : Possible discharge home in 1 to 2 days if able to achieve pain control with outpatient oncology follow-up  Consults  : Oncology/vascular surgery/palliative care/ interventional  radiology  Procedures:  7/9 US guided right neck mass biopsy. Mx 18g core biopsy.  Antimicrobials:  Unaysn 7/11 >> 7/13  Augmentin 7/13 >>  Zosyn 7/8 >> 7/10  Vancomycin 7/8 >> 7/10  DVT Prophylaxis  : Fondaparinux/Arixtra  Lab Results  Component Value Date   PLT 453 (H) 12/22/2017   Inpatient Medications  Scheduled Meds: . acetaminophen  1,000 mg Oral TID  . amoxicillin-clavulanate  1 tablet Oral Q12H  . atorvastatin  40 mg Oral Daily  .  dronabinol  2.5 mg Oral BID AC  . escitalopram  20 mg Oral Daily  . feeding supplement  1 Container Oral TID BM  . fondaparinux (ARIXTRA) injection  7.5 mg Subcutaneous q1800  . gabapentin  300 mg Oral BID  . lidocaine  1 patch Transdermal Q24H  . mirtazapine  15 mg Oral QHS  . ondansetron  4 mg Oral Q6H  . pantoprazole  40 mg Oral Daily  . senna  1 tablet Oral QHS  . sodium chloride flush  3 mL Intravenous Q12H   Continuous  Infusions: . sodium chloride Stopped (12/25/17 2338)  . sodium chloride     PRN Meds:.sodium chloride, sodium chloride, alum & mag hydroxide-simeth, bisacodyl, guaiFENesin-dextromethorphan, hydrALAZINE, HYDROmorphone (DILAUDID) injection, HYDROmorphone, labetalol, LORazepam, phenol, polyethylene glycol, promethazine, sodium chloride flush   Anti-infectives (From admission, onward)   Start     Dose/Rate Route Frequency Ordered Stop   12/22/17 1300  amoxicillin-clavulanate (AUGMENTIN) 875-125 MG per tablet 1 tablet     1 tablet Oral Every 12 hours 12/22/17 1219     12/20/17 1400  Ampicillin-Sulbactam (UNASYN) 3 g in sodium chloride 0.9 % 100 mL IVPB  Status:  Discontinued     3 g 200 mL/hr over 30 Minutes Intravenous Every 6 hours 12/20/17 1145 12/22/17 1219   12/20/17 0000  vancomycin (VANCOCIN) 500 mg in sodium chloride 0.9 % 100 mL IVPB  Status:  Discontinued     500 mg 100 mL/hr over 60 Minutes Intravenous Every 12 hours 12/19/17 1500 12/20/17 1145   12/18/17 0300  vancomycin (VANCOCIN) IVPB 1000 mg/200 mL premix  Status:  Discontinued     1,000 mg 200 mL/hr over 60 Minutes Intravenous Every 12 hours 12/17/17 1354 12/19/17 1500   12/17/17 2000  piperacillin-tazobactam (ZOSYN) IVPB 3.375 g  Status:  Discontinued     3.375 g 12.5 mL/hr over 240 Minutes Intravenous Every 8 hours 12/17/17 1354 12/20/17 1145   12/17/17 1430  piperacillin-tazobactam (ZOSYN) IVPB 3.375 g     3.375 g 100 mL/hr over 30 Minutes Intravenous  Once 12/17/17 1351 12/17/17 1916   12/17/17 1430  vancomycin (VANCOCIN) 1,750 mg in sodium chloride 0.9 % 500 mL IVPB     1,750 mg 250 mL/hr over 120 Minutes Intravenous  Once 12/17/17 1351 12/17/17 1747        Objective:   Vitals:   12/26/17 2000 12/27/17 0340 12/27/17 1147 12/27/17 1224  BP: (!) 151/76 (!) 160/96 (!) 180/78   Pulse: (!) 110 81 97   Resp: 16  18 18   Temp: 99 F (37.2 C) 98.1 F (36.7 C) 98.1 F (36.7 C)   TempSrc: Oral Oral Oral   SpO2: 96%  96% 95% 98%  Weight:      Height:        Wt Readings from Last 3 Encounters:  12/18/17 71.6 kg (157 lb 13.6 oz)  12/04/17 81.6 kg (179 lb 14.3 oz)  11/28/17 81.6 kg (180 lb)     Intake/Output Summary (Last 24 hours) at 12/27/2017 1541 Last data filed at 12/27/2017 0634 Gross per 24 hour  Intake 243 ml  Output 700 ml  Net -457 ml     Physical Exam  Gen:- Awake Alert, patient looks tired HEENT:- [place].AT, No sclera icterus Neck--adenopathy  lungs-  CTAB , fair air movement CV- S1, S2 normal Abd-  +ve B.Sounds, Abd Soft, No tenderness,    Extremity/Skin:-Right foot dry gangrene, bilateral upper extremity edema right more than left Psych-affect is flat/depressed, oriented x3  Neuro-no new focal deficits, no tremors   Data Review:   Micro Results No results found for this or any previous visit (from the past 240 hour(s)).  Radiology Reports Ct Soft Tissue Neck W Contrast  Result Date: 12/17/2017 CLINICAL DATA:  52 y/o M; recently removed right neck PICC line with swelling and pain in the region of PICC line. EXAM: CT NECK WITH CONTRAST TECHNIQUE: Multidetector CT imaging of the neck was performed using the standard protocol following the bolus administration of intravenous contrast. CONTRAST:  100 cc Omnipaque 300 COMPARISON:  None. FINDINGS: Pharynx and larynx: Normal. No mass or swelling. Salivary glands: No inflammation, mass, or stone. Thyroid: Normal. Lymph nodes: Right cervical, bilateral supraclavicular, and upper mediastinal bulky lymphadenopathy. A right upper paratracheal lymph node measures 5.0 x 3.5 cm (series 3, image 109), left supraclavicular node measures 3.1 x 1.7 cm (series 3, image 85), and a right supraclavicular node measures 1.6 x 2.1 cm (series 3, image 68). Vascular: Deep venous thrombosis involving right subclavian vein, right brachiocephalic vein, and right internal jugular vein. Limited intracranial: Negative. Visualized orbits: Negative. Mastoids and  visualized paranasal sinuses: Small right maxillary sinus mucous retention cyst. Skeleton: No acute or aggressive process. Upper chest: Spiculated nodule in the right upper lobe measuring 16 x 12 mm (series 3, image 115). Other: Mild edema in the right supraclavicular fossa and lower neck. IMPRESSION: 1. Deep venous thrombosis involving the right subclavian vein, right brachiocephalic vein, and right internal jugular vein. 2. Edema in the right lower neck and supraclavicular fossa is probably related to venous congestion from DVT and/or phlebitis. No abscess. 3. Severe bulky lymphadenopathy of right cervical, bilateral supraclavicular, and upper mediastinal lymph nodes likely representing metastatic disease or lymphoproliferative disease. 4. Spiculated right upper lobe nodule measuring up to 16 mm. Consider one of the following in 3 months for both low-risk and high-risk individuals: (a) repeat chest CT, (b) follow-up PET-CT, or (c) tissue sampling. This recommendation follows the consensus statement: Guidelines for Management of Incidental Pulmonary Nodules Detected on CT Images: From the Fleischner Society 2017; Radiology 2017; 284:228-243. These results will be called to the ordering clinician or representative by the Radiologist Assistant, and communication documented in the PACS or zVision Dashboard. Electronically Signed   By: Kristine Garbe M.D.   On: 12/17/2017 15:04   Ct Chest W Contrast  Result Date: 12/25/2017 CLINICAL DATA:  Spiculated lung mass and adenopathy shown on neck CT, for further workup. EXAM: CT CHEST, ABDOMEN, AND PELVIS WITH CONTRAST TECHNIQUE: Multidetector CT imaging of the chest, abdomen and pelvis was performed following the standard protocol during bolus administration of intravenous contrast. CONTRAST:  138mL OMNIPAQUE IOHEXOL 300 MG/ML  SOLN COMPARISON:  12/17/2017 and CT abdomen from 08/14/2011 there is some mild fatty infiltration in segment IV adjacent to the  falciform ligament. Otherwise unremarkable. FINDINGS: CT CHEST FINDINGS Cardiovascular: Aortic arch and branch vessel atherosclerotic vascular disease. Poor visualization of contrast in the right internal jugular vein, compatible with patient's known venous thrombosis. Mediastinum/Nodes: Bilateral lower neck level IV adenopathy with ill definition. A left level Roman numeral 4 lymph node measures 2.0 cm in short axis on image 2/3. There is also bilateral supraclavicular adenopathy including a 1.9 cm right-sided supraclavicular node on image 7/3. Bulky paratracheal, prevascular, subcarinal, and right hilar adenopathy noted. An index right paratracheal node measures 3.6 cm in short axis on image 16/3. Enlarged right subpectoral lymph nodes are present along with scattered right axillary lymph nodes. Mild stranding in the anterior  mediastinum. Lungs/Pleura: Moderate right and small left pleural effusion are present and are nonspecific for transudative versus exudative etiology. A spiculated right upper lobe nodule measures 2.4 by 1.3 by 1.4 cm, roughly stable from 12/17/2017. There is passive atelectasis in both lower lobes, right greater than left. Paraseptal emphysema noted. Musculoskeletal: Notable subcutaneous edema throughout the chest, somewhat confluent along the right upper chest. CT ABDOMEN PELVIS FINDINGS Hepatobiliary: Unremarkable Pancreas: Unremarkable Spleen: Unremarkable Adrenals/Urinary Tract: Unremarkable Stomach/Bowel: Air-fluid levels in the rectum indicating diarrheal process. Borderline wall thickening several segments of proximal small bowel. Vascular/Lymphatic: Aortoiliac atherosclerotic vascular disease. Aortobifemoral graft small fluid collection superficial to the right groin likely representing residual hematoma from prior groin puncture, but only measuring about 2.4 by 2.1 by 4.3 cm (volume = 11 cm^3). No gas in this collection. Left inguinal lymph node 0.9 cm in short axis. Reproductive:  Unremarkable Other: Diffuse subcutaneous edema. Small amount of pelvic ascites. Gas tracking along the anterior abdominal wall subcutaneous tissues, correlate with any anterior abdominal wall injections. Subcutaneous edema extends along the pubis and towards the penis. Musculoskeletal: Unremarkable IMPRESSION: 1. Bulky adenopathy in the neck and upper chest. Persistent 2.4 cm spiculated right upper lobe nodule. The degree of adenopathy seems disproportionate to the size of the nodule, and this could represent adenopathy related to lung cancer or potentially 2 separate processes such as lymphoproliferative disease and a separate primary lung cancer. An infectious cause for the pulmonary nodule is less likely. 2. Extensive third spacing of fluid in the chest, abdomen and pelvis. Query hypoproteinemia or hypoalbuminemia. 3. Poor visualization of contrast in the right internal jugular vein compatible with the patient's known venous thrombosis. 4. Air fluid levels in the rectum indicating diarrheal process. Borderline wall thickening in the proximal small bowel, query proximal enteritis. 5. The small fluid collection superficial to the right groin puncture site is only about 11 cc in volume, and probably reflects a resolving hematoma. 6. Gas tracking along anterior abdominal wall subcutaneous tissues, probably from injections-correlate with any anterior abdominal wall subcutaneous injections recently. Electronically Signed   By: Van Clines M.D.   On: 12/25/2017 00:05   Mr Jeri Cos GU Contrast  Result Date: 12/25/2017 CLINICAL DATA:  52 year old male with right upper extremity and right central venous DVT with bulky cervical and thoracic lymphadenopathy and spiculated right apical lung nodule. EXAM: MRI HEAD WITHOUT AND WITH CONTRAST TECHNIQUE: Multiplanar, multiecho pulse sequences of the brain and surrounding structures were obtained without and with intravenous contrast. CONTRAST:  61mL MULTIHANCE GADOBENATE  DIMEGLUMINE 529 MG/ML IV SOLN COMPARISON:  Neck CT 12/17/2017. Head CT without contrast 08/03/2006. Neck and chest CT FINDINGS: Brain: No midline shift, mass effect, or evidence of intracranial mass lesion. No abnormal enhancement identified. No dural thickening. No restricted diffusion to suggest acute infarction. No ventriculomegaly, extra-axial collection or acute intracranial hemorrhage. Cervicomedullary junction and pituitary are within normal limits. Pearline Cables and white matter signal is within normal limits for age throughout the brain; Minimal nonspecific left frontal lobe white matter T2 and FLAIR hyperintensity (series 6, image 19). No cortical encephalomalacia or chronic cerebral blood products. Vascular: Major intracranial vascular flow voids are preserved. The major dural venous sinuses are enhancing and appear patent. The right IJ bulb at the skull base is patent. Skull and upper cervical spine: Negative visible cervical spine and spinal cord. Visualized bone marrow signal is within normal limits. Sinuses/Orbits: Negative orbits soft tissues. Paranasal sinuses and mastoids are stable and well pneumatized. Other: Visible internal auditory structures appear  normal. Partially visible bulky right neck lymphadenopathy (series 4, image 14). IMPRESSION: 1. No cerebral metastatic disease or acute intracranial abnormality identified. 2. Right cervical lymphadenopathy redemonstrated. Electronically Signed   By: Genevie Ann M.D.   On: 12/25/2017 09:17   Ct Abdomen Pelvis W Contrast  Result Date: 12/25/2017 CLINICAL DATA:  Spiculated lung mass and adenopathy shown on neck CT, for further workup. EXAM: CT CHEST, ABDOMEN, AND PELVIS WITH CONTRAST TECHNIQUE: Multidetector CT imaging of the chest, abdomen and pelvis was performed following the standard protocol during bolus administration of intravenous contrast. CONTRAST:  142mL OMNIPAQUE IOHEXOL 300 MG/ML  SOLN COMPARISON:  12/17/2017 and CT abdomen from 08/14/2011 there  is some mild fatty infiltration in segment IV adjacent to the falciform ligament. Otherwise unremarkable. FINDINGS: CT CHEST FINDINGS Cardiovascular: Aortic arch and branch vessel atherosclerotic vascular disease. Poor visualization of contrast in the right internal jugular vein, compatible with patient's known venous thrombosis. Mediastinum/Nodes: Bilateral lower neck level IV adenopathy with ill definition. A left level Roman numeral 4 lymph node measures 2.0 cm in short axis on image 2/3. There is also bilateral supraclavicular adenopathy including a 1.9 cm right-sided supraclavicular node on image 7/3. Bulky paratracheal, prevascular, subcarinal, and right hilar adenopathy noted. An index right paratracheal node measures 3.6 cm in short axis on image 16/3. Enlarged right subpectoral lymph nodes are present along with scattered right axillary lymph nodes. Mild stranding in the anterior mediastinum. Lungs/Pleura: Moderate right and small left pleural effusion are present and are nonspecific for transudative versus exudative etiology. A spiculated right upper lobe nodule measures 2.4 by 1.3 by 1.4 cm, roughly stable from 12/17/2017. There is passive atelectasis in both lower lobes, right greater than left. Paraseptal emphysema noted. Musculoskeletal: Notable subcutaneous edema throughout the chest, somewhat confluent along the right upper chest. CT ABDOMEN PELVIS FINDINGS Hepatobiliary: Unremarkable Pancreas: Unremarkable Spleen: Unremarkable Adrenals/Urinary Tract: Unremarkable Stomach/Bowel: Air-fluid levels in the rectum indicating diarrheal process. Borderline wall thickening several segments of proximal small bowel. Vascular/Lymphatic: Aortoiliac atherosclerotic vascular disease. Aortobifemoral graft small fluid collection superficial to the right groin likely representing residual hematoma from prior groin puncture, but only measuring about 2.4 by 2.1 by 4.3 cm (volume = 11 cm^3). No gas in this collection.  Left inguinal lymph node 0.9 cm in short axis. Reproductive: Unremarkable Other: Diffuse subcutaneous edema. Small amount of pelvic ascites. Gas tracking along the anterior abdominal wall subcutaneous tissues, correlate with any anterior abdominal wall injections. Subcutaneous edema extends along the pubis and towards the penis. Musculoskeletal: Unremarkable IMPRESSION: 1. Bulky adenopathy in the neck and upper chest. Persistent 2.4 cm spiculated right upper lobe nodule. The degree of adenopathy seems disproportionate to the size of the nodule, and this could represent adenopathy related to lung cancer or potentially 2 separate processes such as lymphoproliferative disease and a separate primary lung cancer. An infectious cause for the pulmonary nodule is less likely. 2. Extensive third spacing of fluid in the chest, abdomen and pelvis. Query hypoproteinemia or hypoalbuminemia. 3. Poor visualization of contrast in the right internal jugular vein compatible with the patient's known venous thrombosis. 4. Air fluid levels in the rectum indicating diarrheal process. Borderline wall thickening in the proximal small bowel, query proximal enteritis. 5. The small fluid collection superficial to the right groin puncture site is only about 11 cc in volume, and probably reflects a resolving hematoma. 6. Gas tracking along anterior abdominal wall subcutaneous tissues, probably from injections-correlate with any anterior abdominal wall subcutaneous injections recently. Electronically Signed  By: Van Clines M.D.   On: 12/25/2017 00:05   Ir US Guide Bx Asp/drain  Result Date: 12/18/2017 INDICATION: 52 year old male with a history of neck mass EXAM: IR ULTRASOUND GUIDANCE MEDICATIONS: None. ANESTHESIA/SEDATION: Moderate (conscious) sedation was employed during this procedure. A total of Versed 2.0 mg and Fentanyl 100 mcg was administered intravenously. Moderate Sedation Time: 12 minutes. The patient's level of  consciousness and vital signs were monitored continuously by radiology nursing throughout the procedure under my direct supervision. FLUOROSCOPY TIME:  None COMPLICATIONS: None PROCEDURE: Informed written consent was obtained from the patient after a thorough discussion of the procedural risks, benefits and alternatives. All questions were addressed. Maximal Sterile Barrier Technique was utilized including caps, mask, sterile gowns, sterile gloves, sterile drape, hand hygiene and skin antiseptic. A timeout was performed prior to the initiation of the procedure. Patient positioned supine position on the ultrasound table. Ultrasound images of the right neck were performed with images stored sent to PACs. Patient is then prepped and draped in the usual sterile fashion. 1% lidocaine was used for local anesthesia. Using ultrasound guidance, multiple 18 gauge core biopsy performed of right neck mass. Tissue specimen placed in the saline. Sterile bandage was placed. Patient tolerated the procedure well and remained hemodynamically stable throughout. No complications were encountered and no significant blood loss. IMPRESSION: Status post ultrasound-guided biopsy of right neck mass. Signed, Dulcy Fanny. Dellia Nims, RPVI [facility] Radiology Electronically Signed   By: Corrie Mckusick D.O.   On: 12/18/2017 17:00   Dg Ang/ext/uni/or Left  Result Date: 12/01/2017 CLINICAL DATA:  Intraoperative arteriogram of the left lower leg EXAM: LEFT ANG/EXT/UNI/ OR FLUOROSCOPY TIME:  Not provided COMPARISON:  None FINDINGS: A single spot intraoperative radiographic image of the left knee provided for review Intra-arterial contrast has been injected from a more proximal location. Images demonstrate the sequela of presumably reverse venous distal femoral bypass grafting. The distal anastomosis appears widely patent without hemodynamically significant stenosis. The distal most aspect of the left  superficial femoral artery as well as the left above and below knee popliteal artery appear widely patent. There are mixed occlusive and nonocclusive filling defect within the tibioperoneal trunk with an apparent single vessel runoff to the left lower leg via the posterior tibial artery. Skin staples are noted about the medial aspect of the left lower leg with associated scattered foci of subcutaneous. IMPRESSION: Distal anastomosis presumably reverse venous distal femoral bypass grafting appears widely patent, however there are mixed occlusive and nonocclusive filling defects with tibioperoneal trunk and apparent single-vessel runoff to the left lower leg via the posterior tibial artery. Electronically Signed   By: Sandi Mariscal M.D.   On: 12/01/2017 14:18     CBC Recent Labs  Lab 12/21/17 0248 12/22/17 0359  WBC 9.0 8.4  HGB 8.3* 8.9*  HCT 27.0* 28.7*  PLT 399 453*  MCV 93.8 93.5  MCH 28.8 29.0  MCHC 30.7 31.0  RDW 13.1 13.1    Chemistries  Recent Labs  Lab 12/22/17 0359 12/23/17 0431 12/24/17 1417 12/25/17 0542 12/26/17 0344  NA 139 141 142 138 140  K 4.2 4.0 3.1* 4.3 4.0  CL 109 112* 119* 111 111  CO2 18* 18* 17* 18* 20*  GLUCOSE 90 90 124* 92 113*  BUN 12 10 8 8  5*  CREATININE 1.58* 1.37* 1.04 1.31* 1.16  CALCIUM 8.7* 8.6* 7.0* 8.5* 8.6*   ------------------------------------------------------------------------------------------------------------------ No results for input(s): CHOL, HDL, LDLCALC, TRIG, CHOLHDL, LDLDIRECT in  the last 72 hours.  Lab Results  Component Value Date   HGBA1C 5.0 11/13/2017   ------------------------------------------------------------------------------------------------------------------ No results for input(s): TSH, T4TOTAL, T3FREE, THYROIDAB in the last 72 hours.  Invalid input(s): FREET3 ------------------------------------------------------------------------------------------------------------------ No results for input(s): VITAMINB12,  FOLATE, FERRITIN, TIBC, IRON, RETICCTPCT in the last 72 hours.  Coagulation profile No results for input(s): INR, PROTIME in the last 168 hours.  No results for input(s): DDIMER in the last 72 hours.  Cardiac Enzymes No results for input(s): CKMB, TROPONINI, MYOGLOBIN in the last 168 hours.  Invalid input(s): CK ------------------------------------------------------------------------------------------------------------------ No results found for: BNP   Roxan Hockey M.D on 12/27/2017 at 3:41 PM   Go to [email or web address] - password [email or web address] for contact info  [facility] - Office  [phone]       ]

## 2017-12-27 NOTE — Progress Notes (Signed)
23 cc of hydromorphone PCA wasted in sink. Witnessed by Bernadene Person RN.   Fritz Pickerel, RN
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
## 2017-12-27 NOTE — Progress Notes (Signed)
   VASCULAR SURGERY ASSESSMENT & PLAN:   The toes on the right foot continue to demarcate.  I can follow this as an outpatient.  He has minimal drainage from the left below the knee incision and his groin incisions look fine.  I will check back next week.  If there are any problems over the weekend Dr. Adele Barthel is on-call.   SUBJECTIVE:   No specific complaints.  He appears to be depressed because of his cancer diagnosis.  PHYSICAL EXAM:   Vitals:   12/26/17 1336 12/26/17 2000 12/27/17 0340 12/27/17 1147  BP: (!) 163/80 (!) 151/76 (!) 160/96 (!) 180/78  Pulse: 95 (!) 110 81 97  Resp: 18 16  18   Temp: 98.7 F (37.1 C) 99 F (37.2 C) 98.1 F (36.7 C) 98.1 F (36.7 C)  TempSrc: Oral Oral Oral Oral  SpO2: 97% 96% 96% 95%  Weight:      Height:       Toes on the right foot are demarcating.  There is no erythema or drainage.  LABS:   Lab Results  Component Value Date   WBC 8.4 12/22/2017   HGB 8.9 (L) 12/22/2017   HCT 28.7 (L) 12/22/2017   MCV 93.5 12/22/2017   PLT 453 (H) 12/22/2017   Lab Results  Component Value Date   CREATININE 1.16 12/26/2017   Lab Results  Component Value Date   INR 1.89 12/18/2017    PROBLEM LIST:    Principal Problem:   Adenocarcinoma ([place]) Active Problems:   S/P aortobifemoral bypass surgery   Lung mass   Postoperative wound infection   Cervical lymphadenopathy   DVT (deep venous thrombosis) ([facility])   PAD (peripheral artery disease) ([facility])   Gangrene of right foot ([facility])   AKI (acute kidney injury) ([place])   Uncontrolled pain   Palliative care encounter   CURRENT MEDS:   . acetaminophen  1,000 mg Oral TID  . amoxicillin-clavulanate  1 tablet Oral Q12H  . atorvastatin  40 mg Oral Daily  . dronabinol  2.5 mg Oral BID AC  . escitalopram  20 mg Oral Daily  . feeding supplement  1 Container Oral TID BM  . fondaparinux (ARIXTRA) injection  7.5 mg Subcutaneous q1800  . gabapentin  300 mg Oral BID  . HYDROmorphone   Intravenous Q4H   . lidocaine  1 patch Transdermal Q24H  . mirtazapine  15 mg Oral QHS  . ondansetron  4 mg Oral Q6H  . pantoprazole  40 mg Oral Daily  . senna  1 tablet Oral QHS  . sodium chloride flush  3 mL Intravenous Q12H    Deitra Mayo Beeper: [phone] Office: [phone] 12/27/2017

## 2017-12-27 NOTE — Progress Notes (Signed)
Pt and wife agreeable to upper GI study tomorrow. Agreeable to NPO at midnight. Called radiology to make them aware.  Fritz Pickerel, RN

## 2017-12-28 ENCOUNTER — Inpatient Hospital Stay (HOSPITAL_COMMUNITY): Payer: BLUE CROSS/BLUE SHIELD

## 2017-12-28 DIAGNOSIS — M542 Cervicalgia: Secondary | ICD-10-CM

## 2017-12-28 DIAGNOSIS — Z515 Encounter for palliative care: Secondary | ICD-10-CM

## 2017-12-28 DIAGNOSIS — C801 Malignant (primary) neoplasm, unspecified: Secondary | ICD-10-CM | POA: Diagnosis present

## 2017-12-28 MED ORDER — GABAPENTIN 300 MG PO CAPS
300.0000 mg | ORAL_CAPSULE | Freq: Three times a day (TID) | ORAL | Status: DC
  Administered 2017-12-28 – 2017-12-29 (×3): 300 mg via ORAL
  Filled 2017-12-28 (×3): qty 1

## 2017-12-28 MED ORDER — HYDROMORPHONE HCL 1 MG/ML IJ SOLN
0.5000 mg | INTRAMUSCULAR | Status: DC | PRN
Start: 2017-12-28 — End: 2017-12-28
  Administered 2017-12-28: 1 mg via INTRAVENOUS
  Filled 2017-12-28: qty 1

## 2017-12-28 MED ORDER — HYDROMORPHONE HCL 1 MG/ML IJ SOLN
1.0000 mg | INTRAMUSCULAR | Status: AC | PRN
  Administered 2017-12-28 – 2017-12-29 (×6): 1.5 mg via INTRAVENOUS
  Filled 2017-12-28 (×6): qty 2

## 2017-12-28 MED ORDER — HYDROMORPHONE HCL 2 MG PO TABS
4.0000 mg | ORAL_TABLET | ORAL | Status: DC | PRN
  Administered 2017-12-28: 4 mg via ORAL
  Filled 2017-12-28: qty 2

## 2017-12-28 MED ORDER — SODIUM CHLORIDE 0.9 % IV SOLN: 250.0000 mL | INTRAVENOUS | Status: DC | PRN

## 2017-12-28 MED ORDER — HYDROMORPHONE HCL 2 MG PO TABS
6.0000 mg | ORAL_TABLET | ORAL | Status: DC | PRN
  Administered 2017-12-28 – 2017-12-29 (×3): 6 mg via ORAL
  Filled 2017-12-28 (×4): qty 3

## 2017-12-28 NOTE — Progress Notes (Signed)
Patient Demographics:    Tony [name], is a 52 y.o. male, DOB - 17-May-1966, DZH:[id number]  Admit date - 12/17/2017   Admitting Physician Angelia Mould, MD  Outpatient Primary MD for the patient is [name], MD  LOS - 11   Chief Complaint  Patient presents with  . Post-op Problem        Subjective:    Tony [name] today has no fevers, no emesis,  No chest pain,  Did not do well with PCA pump,   Assessment  & Plan :    Principal Problem:   Adenocarcinoma (HCC) Active Problems:   S/P aortobifemoral bypass surgery   Lung mass   Postoperative wound infection   Cervical lymphadenopathy   DVT (deep venous thrombosis) (HCC)   PAD (peripheral artery disease) (HCC)   Gangrene of right foot (HCC)   AKI (acute kidney injury) ([place])   Uncontrolled pain   Palliative care encounter   Neck pain due to malignant neoplastic disease ([place])   Palliative care by specialist  Brief Summary  52 year old man PMH critical limb ischemia, status post aortobifemoral bypass, right femoral artery to popliteal bypass, status post thrombectomy left limb aVF bypass admitted on 12/17/2017 with drainage from left groin wound and left below-knee incision and concerns about postop wound infection and cellulitis, patient also had neck pain imaging studies suggested a spiculated lung mass concerning for malignancy, UGI barium study does not show any evidence of possible upper GI malignancy   Plan:- 1)Poorly differentiated Adenocarcinoma of unclear primary--suspect lung cancer in a smoker (Rt upper lobe) , UGI barium study does not show any evidence of possible upper GI malignancy,  d/w Dr Irene Limbo  , patient will need to follow-up with oncology within a week of discharge to discuss treatment options post discharge from the hospital.  Palliative care consult to help with pain management appreciated,   pain control remains  challenging, patient is still requiring IV Dilaudid, did not tolerate PCA pump well, plan to titrate up gabapentin and titrate up fentanyl patch  2)Venous Thrombosis of the Right subclavian vein/right brachiocephalic vein/right internal jugular vein--as per Dr. Irene Limbo treat empirically with fondaparinux (Arixtra)  3)Left groin postop wound infection- Improving, Augmentin as ordered, vascular surgery input appreciated  4)Acute "Malignancy" related  pain--patient has significant neck pain suspect due to significant adenopathy associated with malignancy, palliative care consult to help with pain management, discussed with Dr. Irene Limbo if radiation may be an option for pain control,  pain control remains challenging, patient is still requiring IV Dilaudid, did not tolerate PCA pump well, plan to titrate up gabapentin and titrate up fentanyl patch  5)PAD with right foot gangrene--- per vascular surgery no need for aggressive approach,  await autoamputation, continue soaks in lukewarm dial soap water daily  6)Social--- plan of care discussed with patient, his daughter, and his wife [name]  7)Anorexia/Anxiety--- Remeron 15 mg nightly as ordered, continue Marinol 2.5 twice a day with food  Code Status : FULL CODE  Disposition Plan  : Possible discharge home in 1 to 2 days if able to achieve pain control with outpatient oncology follow-up  Consults  : Oncology/vascular surgery/palliative care/ interventional  radiology  Procedures:  7/9 US guided right neck mass biopsy. Mx 18g  core biopsy.  Antimicrobials:  Unaysn 7/11 >> 7/13  Augmentin 7/13 >>  Zosyn 7/8 >> 7/10  Vancomycin 7/8 >> 7/10  DVT Prophylaxis  : Fondaparinux/Arixtra  Lab Results  Component Value Date   PLT 453 (H) 12/22/2017   Inpatient Medications  Scheduled Meds: . acetaminophen  1,000 mg Oral TID  . amoxicillin-clavulanate  1 tablet Oral Q12H  . atorvastatin  40 mg Oral Daily  . dronabinol  2.5 mg Oral BID AC  .  escitalopram  20 mg Oral Daily  . feeding supplement  1 Container Oral TID BM  . fentaNYL  50 mcg Transdermal Q72H  . fondaparinux (ARIXTRA) injection  7.5 mg Subcutaneous q1800  . gabapentin  300 mg Oral TID  . lidocaine  1 patch Transdermal Q24H  . mirtazapine  15 mg Oral QHS  . ondansetron  4 mg Oral Q6H  . pantoprazole  40 mg Oral Daily  . senna  1 tablet Oral QHS  . sodium chloride flush  3 mL Intravenous Q12H   Continuous Infusions: . sodium chloride    . sodium chloride     PRN Meds:.sodium chloride, sodium chloride, alum & mag hydroxide-simeth, bisacodyl, guaiFENesin-dextromethorphan, hydrALAZINE, HYDROmorphone (DILAUDID) injection, HYDROmorphone, labetalol, LORazepam, phenol, polyethylene glycol, promethazine, sodium chloride flush   Anti-infectives (From admission, onward)   Start     Dose/Rate Route Frequency Ordered Stop   12/22/17 1300  amoxicillin-clavulanate (AUGMENTIN) 875-125 MG per tablet 1 tablet     1 tablet Oral Every 12 hours 12/22/17 1219     12/20/17 1400  Ampicillin-Sulbactam (UNASYN) 3 g in sodium chloride 0.9 % 100 mL IVPB  Status:  Discontinued     3 g 200 mL/hr over 30 Minutes Intravenous Every 6 hours 12/20/17 1145 12/22/17 1219   12/20/17 0000  vancomycin (VANCOCIN) 500 mg in sodium chloride 0.9 % 100 mL IVPB  Status:  Discontinued     500 mg 100 mL/hr over 60 Minutes Intravenous Every 12 hours 12/19/17 1500 12/20/17 1145   12/18/17 0300  vancomycin (VANCOCIN) IVPB 1000 mg/200 mL premix  Status:  Discontinued     1,000 mg 200 mL/hr over 60 Minutes Intravenous Every 12 hours 12/17/17 1354 12/19/17 1500   12/17/17 2000  piperacillin-tazobactam (ZOSYN) IVPB 3.375 g  Status:  Discontinued     3.375 g 12.5 mL/hr over 240 Minutes Intravenous Every 8 hours 12/17/17 1354 12/20/17 1145   12/17/17 1430  piperacillin-tazobactam (ZOSYN) IVPB 3.375 g     3.375 g 100 mL/hr over 30 Minutes Intravenous  Once 12/17/17 1351 12/17/17 1916   12/17/17 1430  vancomycin  (VANCOCIN) 1,750 mg in sodium chloride 0.9 % 500 mL IVPB     1,750 mg 250 mL/hr over 120 Minutes Intravenous  Once 12/17/17 1351 12/17/17 1747        Objective:   Vitals:   12/27/17 1147 12/27/17 1224 12/27/17 2126 12/28/17 0718  BP: (!) 180/78  (!) 170/96 (!) 163/78  Pulse: 97  (!) 107   Resp: 18 18    Temp: 98.1 F (36.7 C)  98 F (36.7 C) 99.3 F (37.4 C)  TempSrc: Oral  Oral Oral  SpO2: 95% 98% 95% 94%  Weight:      Height:        Wt Readings from Last 3 Encounters:  12/18/17 71.6 kg (157 lb 13.6 oz)  12/04/17 81.6 kg (179 lb 14.3 oz)  11/28/17 81.6 kg (180 lb)    No intake or output data in the 24 hours  ending 12/28/17 1755   Physical Exam  Gen:- Awake Alert,  HEENT:- [place].AT, No sclera icterus Neck--adenopathy/swelling lungs-  CTAB , fair air movement CV- S1, S2 normal Abd-  +ve B.Sounds, Abd Soft, No tenderness,    Extremity/Skin:-Right foot dry gangrene, bilateral upper extremity edema right more than left Psych-affect is flat , oriented x3 Neuro-no new focal deficits, no tremors   Data Review:   Micro Results No results found for this or any previous visit (from the past 240 hour(s)).  Radiology Reports Ct Soft Tissue Neck W Contrast  Result Date: 12/17/2017 CLINICAL DATA:  52 y/o M; recently removed right neck PICC line with swelling and pain in the region of PICC line. EXAM: CT NECK WITH CONTRAST TECHNIQUE: Multidetector CT imaging of the neck was performed using the standard protocol following the bolus administration of intravenous contrast. CONTRAST:  100 cc Omnipaque 300 COMPARISON:  None. FINDINGS: Pharynx and larynx: Normal. No mass or swelling. Salivary glands: No inflammation, mass, or stone. Thyroid: Normal. Lymph nodes: Right cervical, bilateral supraclavicular, and upper mediastinal bulky lymphadenopathy. A right upper paratracheal lymph node measures 5.0 x 3.5 cm (series 3, image 109), left supraclavicular node measures 3.1 x 1.7 cm (series 3,  image 85), and a right supraclavicular node measures 1.6 x 2.1 cm (series 3, image 68). Vascular: Deep venous thrombosis involving right subclavian vein, right brachiocephalic vein, and right internal jugular vein. Limited intracranial: Negative. Visualized orbits: Negative. Mastoids and visualized paranasal sinuses: Small right maxillary sinus mucous retention cyst. Skeleton: No acute or aggressive process. Upper chest: Spiculated nodule in the right upper lobe measuring 16 x 12 mm (series 3, image 115). Other: Mild edema in the right supraclavicular fossa and lower neck. IMPRESSION: 1. Deep venous thrombosis involving the right subclavian vein, right brachiocephalic vein, and right internal jugular vein. 2. Edema in the right lower neck and supraclavicular fossa is probably related to venous congestion from DVT and/or phlebitis. No abscess. 3. Severe bulky lymphadenopathy of right cervical, bilateral supraclavicular, and upper mediastinal lymph nodes likely representing metastatic disease or lymphoproliferative disease. 4. Spiculated right upper lobe nodule measuring up to 16 mm. Consider one of the following in 3 months for both low-risk and high-risk individuals: (a) repeat chest CT, (b) follow-up PET-CT, or (c) tissue sampling. This recommendation follows the consensus statement: Guidelines for Management of Incidental Pulmonary Nodules Detected on CT Images: From the Fleischner Society 2017; Radiology 2017; 284:228-243. These results will be called to the ordering clinician or representative by the Radiologist Assistant, and communication documented in the PACS or zVision Dashboard. Electronically Signed   By: Kristine Garbe M.D.   On: 12/17/2017 15:04   Ct Chest W Contrast  Result Date: 12/25/2017 CLINICAL DATA:  Spiculated lung mass and adenopathy shown on neck CT, for further workup. EXAM: CT CHEST, ABDOMEN, AND PELVIS WITH CONTRAST TECHNIQUE: Multidetector CT imaging of the chest, abdomen and  pelvis was performed following the standard protocol during bolus administration of intravenous contrast. CONTRAST:  123mL OMNIPAQUE IOHEXOL 300 MG/ML  SOLN COMPARISON:  12/17/2017 and CT abdomen from 08/14/2011 there is some mild fatty infiltration in segment IV adjacent to the falciform ligament. Otherwise unremarkable. FINDINGS: CT CHEST FINDINGS Cardiovascular: Aortic arch and branch vessel atherosclerotic vascular disease. Poor visualization of contrast in the right internal jugular vein, compatible with patient's known venous thrombosis. Mediastinum/Nodes: Bilateral lower neck level IV adenopathy with ill definition. A left level Roman numeral 4 lymph node measures 2.0 cm in short axis on image 2/3.  There is also bilateral supraclavicular adenopathy including a 1.9 cm right-sided supraclavicular node on image 7/3. Bulky paratracheal, prevascular, subcarinal, and right hilar adenopathy noted. An index right paratracheal node measures 3.6 cm in short axis on image 16/3. Enlarged right subpectoral lymph nodes are present along with scattered right axillary lymph nodes. Mild stranding in the anterior mediastinum. Lungs/Pleura: Moderate right and small left pleural effusion are present and are nonspecific for transudative versus exudative etiology. A spiculated right upper lobe nodule measures 2.4 by 1.3 by 1.4 cm, roughly stable from 12/17/2017. There is passive atelectasis in both lower lobes, right greater than left. Paraseptal emphysema noted. Musculoskeletal: Notable subcutaneous edema throughout the chest, somewhat confluent along the right upper chest. CT ABDOMEN PELVIS FINDINGS Hepatobiliary: Unremarkable Pancreas: Unremarkable Spleen: Unremarkable Adrenals/Urinary Tract: Unremarkable Stomach/Bowel: Air-fluid levels in the rectum indicating diarrheal process. Borderline wall thickening several segments of proximal small bowel. Vascular/Lymphatic: Aortoiliac atherosclerotic vascular disease. Aortobifemoral  graft small fluid collection superficial to the right groin likely representing residual hematoma from prior groin puncture, but only measuring about 2.4 by 2.1 by 4.3 cm (volume = 11 cm^3). No gas in this collection. Left inguinal lymph node 0.9 cm in short axis. Reproductive: Unremarkable Other: Diffuse subcutaneous edema. Small amount of pelvic ascites. Gas tracking along the anterior abdominal wall subcutaneous tissues, correlate with any anterior abdominal wall injections. Subcutaneous edema extends along the pubis and towards the penis. Musculoskeletal: Unremarkable IMPRESSION: 1. Bulky adenopathy in the neck and upper chest. Persistent 2.4 cm spiculated right upper lobe nodule. The degree of adenopathy seems disproportionate to the size of the nodule, and this could represent adenopathy related to lung cancer or potentially 2 separate processes such as lymphoproliferative disease and a separate primary lung cancer. An infectious cause for the pulmonary nodule is less likely. 2. Extensive third spacing of fluid in the chest, abdomen and pelvis. Query hypoproteinemia or hypoalbuminemia. 3. Poor visualization of contrast in the right internal jugular vein compatible with the patient's known venous thrombosis. 4. Air fluid levels in the rectum indicating diarrheal process. Borderline wall thickening in the proximal small bowel, query proximal enteritis. 5. The small fluid collection superficial to the right groin puncture site is only about 11 cc in volume, and probably reflects a resolving hematoma. 6. Gas tracking along anterior abdominal wall subcutaneous tissues, probably from injections-correlate with any anterior abdominal wall subcutaneous injections recently. Electronically Signed   By: Van Clines M.D.   On: 12/25/2017 00:05   Mr Jeri Cos MW Contrast  Result Date: 12/25/2017 CLINICAL DATA:  52 year old male with right upper extremity and right central venous DVT with bulky cervical and thoracic  lymphadenopathy and spiculated right apical lung nodule. EXAM: MRI HEAD WITHOUT AND WITH CONTRAST TECHNIQUE: Multiplanar, multiecho pulse sequences of the brain and surrounding structures were obtained without and with intravenous contrast. CONTRAST:  32mL MULTIHANCE GADOBENATE DIMEGLUMINE 529 MG/ML IV SOLN COMPARISON:  Neck CT 12/17/2017. Head CT without contrast 08/03/2006. Neck and chest CT FINDINGS: Brain: No midline shift, mass effect, or evidence of intracranial mass lesion. No abnormal enhancement identified. No dural thickening. No restricted diffusion to suggest acute infarction. No ventriculomegaly, extra-axial collection or acute intracranial hemorrhage. Cervicomedullary junction and pituitary are within normal limits. [place] Cables and white matter signal is within normal limits for age throughout the brain; Minimal nonspecific left frontal lobe white matter T2 and FLAIR hyperintensity (series 6, image 19). No cortical encephalomalacia or chronic cerebral blood products. Vascular: Major intracranial vascular flow voids are preserved. The major  dural venous sinuses are enhancing and appear patent. The right IJ bulb at the skull base is patent. Skull and upper cervical spine: Negative visible cervical spine and spinal cord. Visualized bone marrow signal is within normal limits. Sinuses/Orbits: Negative orbits soft tissues. Paranasal sinuses and mastoids are stable and well pneumatized. Other: Visible internal auditory structures appear normal. Partially visible bulky right neck lymphadenopathy (series 4, image 14). IMPRESSION: 1. No cerebral metastatic disease or acute intracranial abnormality identified. 2. Right cervical lymphadenopathy redemonstrated. Electronically Signed   By: Genevie Ann M.D.   On: 12/25/2017 09:17   Ct Abdomen Pelvis W Contrast  Result Date: 12/25/2017 CLINICAL DATA:  Spiculated lung mass and adenopathy shown on neck CT, for further workup. EXAM: CT CHEST, ABDOMEN, AND PELVIS WITH CONTRAST  TECHNIQUE: Multidetector CT imaging of the chest, abdomen and pelvis was performed following the standard protocol during bolus administration of intravenous contrast. CONTRAST:  129mL OMNIPAQUE IOHEXOL 300 MG/ML  SOLN COMPARISON:  12/17/2017 and CT abdomen from 08/14/2011 there is some mild fatty infiltration in segment IV adjacent to the falciform ligament. Otherwise unremarkable. FINDINGS: CT CHEST FINDINGS Cardiovascular: Aortic arch and branch vessel atherosclerotic vascular disease. Poor visualization of contrast in the right internal jugular vein, compatible with patient's known venous thrombosis. Mediastinum/Nodes: Bilateral lower neck level IV adenopathy with ill definition. A left level Roman numeral 4 lymph node measures 2.0 cm in short axis on image 2/3. There is also bilateral supraclavicular adenopathy including a 1.9 cm right-sided supraclavicular node on image 7/3. Bulky paratracheal, prevascular, subcarinal, and right hilar adenopathy noted. An index right paratracheal node measures 3.6 cm in short axis on image 16/3. Enlarged right subpectoral lymph nodes are present along with scattered right axillary lymph nodes. Mild stranding in the anterior mediastinum. Lungs/Pleura: Moderate right and small left pleural effusion are present and are nonspecific for transudative versus exudative etiology. A spiculated right upper lobe nodule measures 2.4 by 1.3 by 1.4 cm, roughly stable from 12/17/2017. There is passive atelectasis in both lower lobes, right greater than left. Paraseptal emphysema noted. Musculoskeletal: Notable subcutaneous edema throughout the chest, somewhat confluent along the right upper chest. CT ABDOMEN PELVIS FINDINGS Hepatobiliary: Unremarkable Pancreas: Unremarkable Spleen: Unremarkable Adrenals/Urinary Tract: Unremarkable Stomach/Bowel: Air-fluid levels in the rectum indicating diarrheal process. Borderline wall thickening several segments of proximal small bowel. Vascular/Lymphatic:  Aortoiliac atherosclerotic vascular disease. Aortobifemoral graft small fluid collection superficial to the right groin likely representing residual hematoma from prior groin puncture, but only measuring about 2.4 by 2.1 by 4.3 cm (volume = 11 cm^3). No gas in this collection. Left inguinal lymph node 0.9 cm in short axis. Reproductive: Unremarkable Other: Diffuse subcutaneous edema. Small amount of pelvic ascites. Gas tracking along the anterior abdominal wall subcutaneous tissues, correlate with any anterior abdominal wall injections. Subcutaneous edema extends along the pubis and towards the penis. Musculoskeletal: Unremarkable IMPRESSION: 1. Bulky adenopathy in the neck and upper chest. Persistent 2.4 cm spiculated right upper lobe nodule. The degree of adenopathy seems disproportionate to the size of the nodule, and this could represent adenopathy related to lung cancer or potentially 2 separate processes such as lymphoproliferative disease and a separate primary lung cancer. An infectious cause for the pulmonary nodule is less likely. 2. Extensive third spacing of fluid in the chest, abdomen and pelvis. Query hypoproteinemia or hypoalbuminemia. 3. Poor visualization of contrast in the right internal jugular vein compatible with the patient's known venous thrombosis. 4. Air fluid levels in the rectum indicating diarrheal process. Borderline wall  thickening in the proximal small bowel, query proximal enteritis. 5. The small fluid collection superficial to the right groin puncture site is only about 11 cc in volume, and probably reflects a resolving hematoma. 6. Gas tracking along anterior abdominal wall subcutaneous tissues, probably from injections-correlate with any anterior abdominal wall subcutaneous injections recently. Electronically Signed   By: Van Clines M.D.   On: 12/25/2017 00:05   Ir US Guide Bx Asp/drain  Result Date: 12/18/2017 INDICATION: 52 year old male with a history of neck mass  EXAM: IR ULTRASOUND GUIDANCE MEDICATIONS: None. ANESTHESIA/SEDATION: Moderate (conscious) sedation was employed during this procedure. A total of Versed 2.0 mg and Fentanyl 100 mcg was administered intravenously. Moderate Sedation Time: 12 minutes. The patient's level of consciousness and vital signs were monitored continuously by radiology nursing throughout the procedure under my direct supervision. FLUOROSCOPY TIME:  None COMPLICATIONS: None PROCEDURE: Informed written consent was obtained from the patient after a thorough discussion of the procedural risks, benefits and alternatives. All questions were addressed. Maximal Sterile Barrier Technique was utilized including caps, mask, sterile gowns, sterile gloves, sterile drape, hand hygiene and skin antiseptic. A timeout was performed prior to the initiation of the procedure. Patient positioned supine position on the ultrasound table. Ultrasound images of the right neck were performed with images stored sent to PACs. Patient is then prepped and draped in the usual sterile fashion. 1% lidocaine was used for local anesthesia. Using ultrasound guidance, multiple 18 gauge core biopsy performed of right neck mass. Tissue specimen placed in the saline. Sterile bandage was placed. Patient tolerated the procedure well and remained hemodynamically stable throughout. No complications were encountered and no significant blood loss. IMPRESSION: Status post ultrasound-guided biopsy of right neck mass. Signed, Dulcy Fanny. Dellia Nims, RPVI [facility] Radiology Electronically Signed   By: Corrie Mckusick D.O.   On: 12/18/2017 17:00   Dg Ang/ext/uni/or Left  Result Date: 12/01/2017 CLINICAL DATA:  Intraoperative arteriogram of the left lower leg EXAM: LEFT ANG/EXT/UNI/ OR FLUOROSCOPY TIME:  Not provided COMPARISON:  None FINDINGS: A single spot intraoperative radiographic image of the left knee provided for review Intra-arterial  contrast has been injected from a more proximal location. Images demonstrate the sequela of presumably reverse venous distal femoral bypass grafting. The distal anastomosis appears widely patent without hemodynamically significant stenosis. The distal most aspect of the left superficial femoral artery as well as the left above and below knee popliteal artery appear widely patent. There are mixed occlusive and nonocclusive filling defect within the tibioperoneal trunk with an apparent single vessel runoff to the left lower leg via the posterior tibial artery. Skin staples are noted about the medial aspect of the left lower leg with associated scattered foci of subcutaneous. IMPRESSION: Distal anastomosis presumably reverse venous distal femoral bypass grafting appears widely patent, however there are mixed occlusive and nonocclusive filling defects with tibioperoneal trunk and apparent single-vessel runoff to the left lower leg via the posterior tibial artery. Electronically Signed   By: Sandi Mariscal M.D.   On: 12/01/2017 14:18   Dg Duanne Limerick W/small Bowel  Result Date: 12/28/2017 CLINICAL DATA:  Metastatic adenocarcinoma of unknown primary. EXAM: UPPER GI SERIES WITH SMALL BOWEL FOLLOW-THROUGH FLUOROSCOPY TIME:  Fluoroscopy Time:  3 minutes 6 seconds TECHNIQUE: Combined double contrast and single contrast upper GI series using effervescent crystals, thick barium, and thin barium. Subsequently, serial images of the small bowel were obtained including spot views of the terminal ileum. COMPARISON:  CT scan dated 12/24/2017  FINDINGS: The esophagus appears normal. The fundus, body, and antrum of the stomach are normal. Pylorus is normal. There is slight edema of the mucosa of the duodenal bulb but the bulb expands and contracts without a mass. The jejunum and ileum appear normal including the terminal ileum. Small bowel transit time was 50 minutes, normal. IMPRESSION: Slight edema of the mucosa of the duodenal bulb.  Otherwise normal upper GI and small-bowel follow-through. No visible mass lesions in the stomach or small bowel. Electronically Signed   By: Lorriane Shire M.D.   On: 12/28/2017 09:47     CBC Recent Labs  Lab 12/22/17 0359  WBC 8.4  HGB 8.9*  HCT 28.7*  PLT 453*  MCV 93.5  MCH 29.0  MCHC 31.0  RDW 13.1    Chemistries  Recent Labs  Lab 12/22/17 0359 12/23/17 0431 12/24/17 1417 12/25/17 0542 12/26/17 0344  NA 139 141 142 138 140  K 4.2 4.0 3.1* 4.3 4.0  CL 109 112* 119* 111 111  CO2 18* 18* 17* 18* 20*  GLUCOSE 90 90 124* 92 113*  BUN 12 10 8 8  5*  CREATININE 1.58* 1.37* 1.04 1.31* 1.16  CALCIUM 8.7* 8.6* 7.0* 8.5* 8.6*   ------------------------------------------------------------------------------------------------------------------ No results for input(s): CHOL, HDL, LDLCALC, TRIG, CHOLHDL, LDLDIRECT in the last 72 hours.  Lab Results  Component Value Date   HGBA1C 5.0 11/13/2017   ------------------------------------------------------------------------------------------------------------------ No results for input(s): TSH, T4TOTAL, T3FREE, THYROIDAB in the last 72 hours.  Invalid input(s): FREET3 ------------------------------------------------------------------------------------------------------------------ No results for input(s): VITAMINB12, FOLATE, FERRITIN, TIBC, IRON, RETICCTPCT in the last 72 hours.  Coagulation profile No results for input(s): INR, PROTIME in the last 168 hours.  No results for input(s): DDIMER in the last 72 hours.  Cardiac Enzymes No results for input(s): CKMB, TROPONINI, MYOGLOBIN in the last 168 hours.  Invalid input(s): CK ------------------------------------------------------------------------------------------------------------------ No results found for: BNP   Roxan Hockey M.D on 12/28/2017 at 5:55 PM   Go to [email or web address] - password [email or web address] for contact info  [facility] - Office  [phone]        ]

## 2017-12-28 NOTE — Progress Notes (Signed)
Physical Therapy Treatment Patient Details Name: Tony [name] MRN: [id number] DOB: 11-Sep-1965 Today's Date: 12/28/2017    History of Present Illness 52 year old man PMH critical limb ischemia, status post aortobifemoral bypass, right femoral artery to popliteal bypass, status post thrombectomy left limb aVF bypass.  Discharged on Xarelto.  Presented 7/8 with drainage from left groin wound and left below-knee incision.  Admitted by vascular surgery and started on IV antibiotics.  Because of neck pain, further imaging was obtained which revealed spiculated lung mass concerning for malignancy, patient transferred to hospitalist service. Pt found to have metastatic adenocarcinoma.     PT Comments    Pt able to amb in hallway with rolling walker. From PT standpoint can return home with wife when medically ready.   Follow Up Recommendations  Home health PT;Supervision - Intermittent     Equipment Recommendations  None recommended by PT    Recommendations for Other Services       Precautions / Restrictions Precautions Precautions: Fall Precaution Comments: R neck and arm edema, gangrenous R toes Restrictions Weight Bearing Restrictions: No    Mobility  Bed Mobility Overal bed mobility: Modified Independent Bed Mobility: Supine to Sit     Supine to sit: HOB elevated;Modified independent (Device/Increase time)     General bed mobility comments: Incr time  Transfers Overall transfer level: Modified independent Equipment used: None;Rolling walker (2 wheeled) Transfers: Sit to/from Stand Sit to Stand: Modified independent (Device/Increase time)            Ambulation/Gait Ambulation/Gait assistance: Supervision Gait Distance (Feet): 475 Feet Assistive device: Rolling walker (2 wheeled) Gait Pattern/deviations: Decreased stride length;Trunk flexed;Step-through pattern Gait velocity: decr Gait velocity interpretation: 1.31 - 2.62 ft/sec, indicative of limited community  ambulator General Gait Details: Initially dizzy with standing but resolved after 10-20 seconds   Stairs             Wheelchair Mobility    Modified Rankin (Stroke Patients Only)       Balance Overall balance assessment: Needs assistance Sitting-balance support: No upper extremity supported;Feet supported Sitting balance-Leahy Scale: Good     Standing balance support: During functional activity;No upper extremity supported Standing balance-Leahy Scale: Fair                              Cognition Arousal/Alertness: Awake/alert Behavior During Therapy: WFL for tasks assessed/performed Overall Cognitive Status: Within Functional Limits for tasks assessed                                        Exercises      General Comments        Pertinent Vitals/Pain Pain Assessment: Faces Faces Pain Scale: Hurts even more Pain Location: neck Pain Descriptors / Indicators: Grimacing;Guarding Pain Intervention(s): Monitored during session;Limited activity within patient's tolerance    Home Living                      Prior Function            PT Goals (current goals can now be found in the care plan section) Progress towards PT goals: Progressing toward goals    Frequency    Min 3X/week      PT Plan Current plan remains appropriate    Co-evaluation  AM-PAC PT "6 Clicks" Daily Activity  Outcome Measure  Difficulty turning over in bed (including adjusting bedclothes, sheets and blankets)?: None Difficulty moving from lying on back to sitting on the side of the bed? : None Difficulty sitting down on and standing up from a chair with arms (e.g., wheelchair, bedside commode, etc,.)?: A Little Help needed moving to and from a bed to chair (including a wheelchair)?: None Help needed walking in hospital room?: None Help needed climbing 3-5 steps with a railing? : A Little 6 Click Score: 22    End of Session    Activity Tolerance: Patient tolerated treatment well Patient left: in bed;with call bell/phone within reach(sitting EOB) Nurse Communication: Mobility status PT Visit Diagnosis: Other abnormalities of gait and mobility (R26.89);Pain Pain - Right/Left: Right(bilaterally) Pain - part of body: (right UE and neck)     Time: 0786-7544 PT Time Calculation (min) (ACUTE ONLY): 15 min  Charges:  $Gait Training: 8-22 mins                    G Codes:       [facility] PT [place] 12/28/2017, 3:30 PM

## 2017-12-28 NOTE — Progress Notes (Signed)
Daily Progress Note   Patient Name: Tony [name]       Date: 12/28/2017 DOB: 04/30/1966  Age: 52 y.o. MRN#: [id number] Attending Physician: Roxan Hockey, MD Primary Care Physician: [name], MD Admit Date: 12/17/2017  Reason for Consultation/Follow-up: Non pain symptom management, Pain control and Psychosocial/spiritual support  Subjective: Patient seen, chart reviewed.  Patient started on fentanyl transdermal 50 mcg every 72 hours on 12/27/2017.  He is utilizing breakthrough pain medicine approximately every 2 hours, combination of IV Dilaudid at 0.5 to 1 mg as well as oral Dilaudid 4 mg every 3 hours as needed.  There is no undue sedation.  He is describing his pain in both somatic as well as neuropathic terms, sharp throbbing, burning and stabbing.  He states it never goes away.  There is no time of day when the pain is worse.  Turning his head to the left makes the pain worse.  He is able to eat and take oral medications.  The best his pain has been over the past 24 hours as a 2 and this is when he is taking the IV Dilaudid as well as oral Dilaudid within an hour of each other.  The worst his pain is been is an 8 to a 10  Today he did work with physical therapy and was able to walk up and down the hall.  He states prior to revascularization surgery that he would not have been able to do that.  His throat pain seems to eclipse  any lower extremity pain he may be having.  Length of Stay: 11  Current Medications: Scheduled Meds:  . acetaminophen  1,000 mg Oral TID  . amoxicillin-clavulanate  1 tablet Oral Q12H  . atorvastatin  40 mg Oral Daily  . dronabinol  2.5 mg Oral BID AC  . escitalopram  20 mg Oral Daily  . feeding supplement  1 Container Oral TID BM  . fentaNYL  50 mcg  Transdermal Q72H  . fondaparinux (ARIXTRA) injection  7.5 mg Subcutaneous q1800  . gabapentin  300 mg Oral TID  . lidocaine  1 patch Transdermal Q24H  . mirtazapine  15 mg Oral QHS  . ondansetron  4 mg Oral Q6H  . pantoprazole  40 mg Oral Daily  . senna  1 tablet Oral QHS  .  sodium chloride flush  3 mL Intravenous Q12H    Continuous Infusions: . sodium chloride    . sodium chloride      PRN Meds: sodium chloride, sodium chloride, alum & mag hydroxide-simeth, bisacodyl, guaiFENesin-dextromethorphan, hydrALAZINE, HYDROmorphone (DILAUDID) injection, HYDROmorphone, labetalol, LORazepam, phenol, polyethylene glycol, promethazine, sodium chloride flush  Physical Exam  Constitutional: He is oriented to person, place, and time. He appears well-developed and well-nourished.  HENT:  Head: Normocephalic and atraumatic.  Neck:  Edema, discolored skin right side of neck Difficulty turning his head to the left  Cardiovascular: Normal rate.  Pulmonary/Chest: Effort normal.  Musculoskeletal: Normal range of motion.  Neurological: He is alert and oriented to person, place, and time.  Skin: Skin is warm and dry.  Psychiatric: He has a normal mood and affect. His behavior is normal. Judgment and thought content normal.  Nursing note and vitals reviewed.           Vital Signs: BP (!) 163/78 (BP Location: Left Arm)   Pulse (!) 107   Temp 99.3 F (37.4 C) (Oral)   Resp 18   Ht 6\' 5"  (1.956 m)   Wt 71.6 kg (157 lb 13.6 oz)   SpO2 94%   BMI 18.72 kg/m  SpO2: SpO2: 94 % O2 Device: O2 Device: Room Air O2 Flow Rate: O2 Flow Rate (L/min): 2 L/min  Intake/output summary:   Intake/Output Summary (Last 24 hours) at 12/28/2017 1542 Last data filed at 12/27/2017 1600 Gross per 24 hour  Intake 25 ml  Output -  Net 25 ml   LBM: Last BM Date: 12/26/17 Baseline Weight: Weight: 71.6 kg (157 lb 13.6 oz) Most recent weight: Weight: 71.6 kg (157 lb 13.6 oz)       Palliative  Assessment/Data:      Patient Active Problem List   Diagnosis Date Noted  . Uncontrolled pain   . Palliative care encounter   . Adenocarcinoma ([place]) 12/25/2017  . Gangrene of right foot ([place]) 12/20/2017  . AKI (acute kidney injury) ([place]) 12/20/2017  . PAD (peripheral artery disease) ([place]) 12/19/2017  . S/P aortobifemoral bypass surgery 12/17/2017  . Lung mass 12/17/2017  . Postoperative wound infection 12/17/2017  . Weight loss 12/17/2017  . Cervical lymphadenopathy 12/17/2017  . DVT (deep venous thrombosis) ([place]) 12/17/2017  . Aortoiliac occlusive disease ([place]) 11/15/2017  . Pain of right lower extremity due to ischemia 11/13/2017  . Hyperlipidemia LDL goal <70 11/13/2017  . PVD (peripheral vascular disease) ([place]) 11/11/2017  . H. pylori infection 10/11/2011  . Duodenal ulcer 08/23/2011  . IBS (irritable bowel syndrome) 08/18/2011  . Depression with anxiety 05/11/2009  . EPIGASTRIC PAIN, CHRONIC 05/11/2009  . SMOKER 09/01/2008  . INSOMNIA, CHRONIC 09/01/2008  . Essential hypertension 09/01/2008  . PALPITATIONS, CHRONIC 09/01/2008  . GASTRITIS 10/24/2007    Palliative Care Assessment & Plan   Patient Profile: 52 y.o.malewith past medical history of HTN, HLD, arterial occlusive disease, PVD, IBS, chronic back pain, anxiety, depressionadmitted on 7/8/2019with increased drainage from incision sites and with critical limb ischemia of left lower extremity.S/P Right Aortobifemoral bypass and fem-pop bypass 6/6 and found to thrombectomy with fem-pop bypass on left 6/22. Hospitalization complicated by uncontrolled pain likely s/t newly diagnosed metastatic adenocarcinoma with significant lymphadenopathy to neck. Also with venous thrombus of right subclavian, brachiocephalic, and IJ veins.  Consult ordered for assistance with pain management   Recommendations/Plan:  Initial recommendations for pain management was a PCA but patient was uncomfortable with this approach and  fentanyl transdermal patch  was started at a low dose of 50 mcg every 72 hours.  Based on his breakthrough pain needs over the past 24 hours, I anticipate upping his fentanyl patch on 12/29/2017.  In the interim, will increase his breakthrough medicines as follows: Dilaudid IV 1 to 1.5 mg every 2 hours as needed, Dilaudid orally 6 mg every 3 hours as needed.  As patient is endorsing neuropathic aspects of pain, we will also uptitrate gabapentin to 300 mg 3 times a day  Question if radiation oncology may be able to provide symptomatic relief for his throat pain given new adenocarcinoma diagnosis  Goals of Care and Additional Recommendations:  Limitations on Scope of Treatment: Full Scope Treatment  Code Status:    Code Status Orders  (From admission, onward)        Start     Ordered   12/17/17 1330  Full code  Continuous     12/17/17 1334    Code Status History    Date Active Date Inactive Code Status Order ID Comments User Context   12/01/2017 1504 12/05/2017 2116 Full Code [id number]  Rosetta Posner, MD Inpatient   11/13/2017 0925 11/26/2017 1601 Full Code [id number]  Karmen Bongo, MD ED   11/11/2017 0208 11/11/2017 1345 Full Code [id number]  Angelia Mould, MD Inpatient   11/11/2017 0154 11/11/2017 0208 Full Code [id number]  Angelia Mould, MD ED   08/22/2011 0013 08/23/2011 1813 Full Code [id number]  Sterrett, Idelle Jo, RN Inpatient       Prognosis:   Unable to determine  Discharge Planning:  To Be Determined  Care plan was discussed with Dr. Domingo Cocking  Thank you for allowing the Palliative Medicine Team to assist in the care of this patient.   Time In: 1500 Time Out: 1545 Total Time 45 min Prolonged Time Billed  no       Greater than 50%  of this time was spent counseling and coordinating care related to the above assessment and plan.  Dory Horn, NP  Please contact Palliative Medicine Team phone at [phone] for questions and concerns.

## 2017-12-28 NOTE — Evaluation (Signed)
Clinical/Bedside Swallow Evaluation Patient Details  Name: Tony [name] MRN: [id number] Date of Birth: 08-29-1965  Today's Date: 12/28/2017 Time: SLP Start Time (ACUTE ONLY): 1454 SLP Stop Time (ACUTE ONLY): 1505 SLP Time Calculation (min) (ACUTE ONLY): 11 min  Past Medical History:  Past Medical History:  Diagnosis Date  . Anxiety   . Arterial occlusive disease    multilevel arterial occlusive disease/notes 11/13/2017  . Arthritis    "left knee" (11/13/2017)  . Chronic back pain   . Chronic lower back pain   . Depression   . Gastric ulcer due to Helicobacter pylori 6761  . GERD (gastroesophageal reflux disease)   . High cholesterol   . Hypertension   . IBS (irritable bowel syndrome)   . Mesenteric adenitis 2011   presumed/H&P  . Migraines    "use to suffer migraines years ago" (11/13/2017)  . Peripheral vascular disease ([place])   . Pneumonia 2011  . Ulcer    Past Surgical History:  Past Surgical History:  Procedure Laterality Date  . AORTA - BILATERAL FEMORAL ARTERY BYPASS GRAFT Bilateral 11/15/2017   Procedure: AORTA BIFEMORAL BYPASS GRAFT;  Surgeon: Angelia Mould, MD;  Location: [facility];  Service: Vascular;  Laterality: Bilateral;  . BACK SURGERY  X 4   "procedure where they burn the nerves every 6 months"  . EMBOLECTOMY Left 12/01/2017   Procedure: THROMBECTOMY OF LEFT AORTAFEMORAL BYPASS, THROMBECTOMY OF TIBIAL ARTERY;  Surgeon: Rosetta Posner, MD;  Location: [facility] OR;  Service: Vascular;  Laterality: Left;  . ESOPHAGOGASTRODUODENOSCOPY  08/22/2011   Procedure: ESOPHAGOGASTRODUODENOSCOPY (EGD);  Surgeon: [name], MD;  Location: [facility];  Service: Gastroenterology;  Laterality: N/A;  . FEMORAL-POPLITEAL BYPASS GRAFT Right 11/15/2017   Procedure: Right FEMORAL-Above knee POPLITEAL ARTERY Bypass Graft using nonreversed saphenous vein ;  Surgeon: Angelia Mould, MD;  Location: [facility];  Service: Vascular;  Laterality: Right;  . FEMORAL-POPLITEAL BYPASS GRAFT Left  12/01/2017   Procedure: BYPASS GRAFT FEMORAL- ABOVE KNEE POPLITEAL ARTERY WITH VEIN;  Surgeon: Rosetta Posner, MD;  Location: [facility] [facility];  Service: Vascular;  Laterality: Left;  . INTRAOPERATIVE ARTERIOGRAM Right 11/15/2017   Procedure: INTRA OPERATIVE ARTERIOGRAM;  Surgeon: Angelia Mould, MD;  Location: [place];  Service: Vascular;  Laterality: Right;  . INTRAOPERATIVE ARTERIOGRAM Left 12/01/2017   Procedure: INTRA OPERATIVE ARTERIOGRAM OF LEFT LEG;  Surgeon: Rosetta Posner, MD;  Location: [facility];  Service: Vascular;  Laterality: Left;  . IR US GUIDE BX ASP/DRAIN  12/18/2017  . KNEE ARTHROSCOPY Left X 44   HPI:  52 year old man PMH critical limb ischemia, status post aortobifemoral bypass, right femoral artery to popliteal bypass, status post thrombectomy left limb aVF bypass.  Discharged on Xarelto.  Presented 7/8 with drainage from left groin wound and left below-knee incision.  Admitted by vascular surgery and started on IV antibiotics.  Because of neck pain, further imaging was obtained which revealed spiculated lung mass concerning for malignancy. Pt dx with right upper lobe nodule and extensive mediastinal and cervical lymphadenopathy concerning for metastatic lung cancer; extensive deep venous thrombosis involving the right subclavian vein, right brachiocephalic vein, and right internal jugular vein - due to cancer + vascular surgery + surgical site infection. Pt has c/o odynophagia, improved since yesterday.  NPO for UGI series 7/19    Assessment / Plan / Recommendation Clinical Impression  Pt presents with normal oropharyngeal swallow, improved pain control with less odynophagia. There is adequate mastication, brisk swallow, no s/s of biomechanical swallowing  deficits.  Resume regular diet, thin liquids. Discussed with RN - pt was NPO for UGI but may resume POs.  No SLP f/u needed.  SLP Visit Diagnosis: Dysphagia, unspecified (R13.10)    Aspiration Risk  No limitations    Diet Recommendation    regular solids, thin liquids  Medication Administration: Whole meds with liquid    Other  Recommendations Oral Care Recommendations: Oral care BID   Follow up Recommendations        Frequency and Duration            Prognosis        Swallow Study   General HPI: 52 year old man PMH critical limb ischemia, status post aortobifemoral bypass, right femoral artery to popliteal bypass, status post thrombectomy left limb aVF bypass.  Discharged on Xarelto.  Presented 7/8 with drainage from left groin wound and left below-knee incision.  Admitted by vascular surgery and started on IV antibiotics.  Because of neck pain, further imaging was obtained which revealed spiculated lung mass concerning for malignancy. Pt dx with right upper lobe nodule and extensive mediastinal and cervical lymphadenopathy concerning for metastatic lung cancer; extensive deep venous thrombosis involving the right subclavian vein, right brachiocephalic vein, and right internal jugular vein - due to cancer + vascular surgery + surgical site infection. Pt has c/o odynophagia, improved since yesterday.  NPO for UGI series 7/19  Type of Study: Bedside Swallow Evaluation Previous Swallow Assessment: no Diet Prior to this Study: NPO Temperature Spikes Noted: No Respiratory Status: Room air History of Recent Intubation: No Behavior/Cognition: Alert;Cooperative Oral Cavity Assessment: Within Functional Limits Oral Care Completed by SLP: No Oral Cavity - Dentition: Adequate natural dentition Vision: Functional for self-feeding Self-Feeding Abilities: Able to feed self Patient Positioning: Upright in bed Baseline Vocal Quality: Normal Volitional Cough: Strong Volitional Swallow: Able to elicit    Oral/Motor/Sensory Function Overall Oral Motor/Sensory Function: Within functional limits(edema/erythema right neck)   Ice Chips Ice chips: Within functional limits   Thin Liquid Thin Liquid: Within functional limits    Nectar  Thick Nectar Thick Liquid: Not tested   Honey Thick Honey Thick Liquid: Not tested   Puree Puree: Within functional limits   Solid   GO   Solid: Within functional limits        Juan Quam Laurice 12/28/2017,3:10 PM

## 2017-12-29 DIAGNOSIS — I96 Gangrene, not elsewhere classified: Secondary | ICD-10-CM

## 2017-12-29 DIAGNOSIS — R221 Localized swelling, mass and lump, neck: Secondary | ICD-10-CM

## 2017-12-29 LAB — COMPREHENSIVE METABOLIC PANEL
ALT: 14 U/L (ref 0–44)
AST: 19 U/L (ref 15–41)
Albumin: 2.2 g/dL — ABNORMAL LOW (ref 3.5–5.0)
Alkaline Phosphatase: 82 U/L (ref 38–126)
Anion gap: 10 (ref 5–15)
BUN: 6 mg/dL (ref 6–20)
CHLORIDE: 108 mmol/L (ref 98–111)
CO2: 25 mmol/L (ref 22–32)
Calcium: 8.4 mg/dL — ABNORMAL LOW (ref 8.9–10.3)
Creatinine, Ser: 1.07 mg/dL (ref 0.61–1.24)
GFR calc Af Amer: >60 mL/min (ref >60)
Glucose, Bld: 124 mg/dL — ABNORMAL HIGH (ref 70–99)
Potassium: 3.6 mmol/L (ref 3.5–5.1)
Sodium: 143 mmol/L (ref 135–145)
Total Bilirubin: 0.5 mg/dL (ref 0.3–1.2)
Total Protein: 5.8 g/dL — ABNORMAL LOW (ref 6.5–8.1)

## 2017-12-29 LAB — CBC
HEMATOCRIT: 25.5 % — AB (ref 39.0–52.0)
HEMOGLOBIN: 7.8 g/dL — AB (ref 13.0–17.0)
MCH: 28.9 pg (ref 26.0–34.0)
MCHC: 30.6 g/dL (ref 30.0–36.0)
MCV: 94.4 fL (ref 78.0–100.0)
PLATELETS: 435 10*3/uL — AB (ref 150–400)
RBC: 2.7 MIL/uL — AB (ref 4.22–5.81)
RDW: 14.3 % (ref 11.5–15.5)
WBC: 9.4 10*3/uL (ref 4.0–10.5)

## 2017-12-29 MED ORDER — ESCITALOPRAM OXALATE 10 MG PO TABS
10.0000 mg | ORAL_TABLET | Freq: Every day | ORAL | Status: DC
  Administered 2017-12-30 – 2018-01-01 (×3): 10 mg via ORAL
  Filled 2017-12-29 (×3): qty 1

## 2017-12-29 MED ORDER — HYDROMORPHONE HCL 2 MG PO TABS
8.0000 mg | ORAL_TABLET | ORAL | Status: DC | PRN
  Administered 2017-12-29 – 2017-12-31 (×5): 8 mg via ORAL
  Filled 2017-12-29 (×5): qty 4

## 2017-12-29 MED ORDER — LORAZEPAM 0.5 MG PO TABS
0.5000 mg | ORAL_TABLET | Freq: Three times a day (TID) | ORAL | Status: DC
  Administered 2017-12-29 (×3): 0.5 mg via ORAL
  Filled 2017-12-29 (×4): qty 1

## 2017-12-29 MED ORDER — GABAPENTIN 400 MG PO CAPS
400.0000 mg | ORAL_CAPSULE | Freq: Three times a day (TID) | ORAL | Status: DC
  Administered 2017-12-29 – 2018-01-17 (×57): 400 mg via ORAL
  Filled 2017-12-29 (×57): qty 1

## 2017-12-29 MED ORDER — FENTANYL 100 MCG/HR TD PT72
100.0000 ug | MEDICATED_PATCH | TRANSDERMAL | Status: DC
  Administered 2017-12-29 – 2018-01-01 (×2): 100 ug via TRANSDERMAL
  Filled 2017-12-29: qty 1
  Filled 2017-12-29: qty 4

## 2017-12-29 MED ORDER — NALOXONE HCL 0.4 MG/ML IJ SOLN: 0.4000 mg | INTRAMUSCULAR | Status: DC | PRN

## 2017-12-29 MED ORDER — HYDROMORPHONE HCL 1 MG/ML IJ SOLN
1.0000 mg | INTRAMUSCULAR | Status: DC | PRN
  Administered 2017-12-29: 1 mg via INTRAVENOUS
  Administered 2017-12-29 (×2): 1.5 mg via INTRAVENOUS
  Administered 2017-12-29: 1 mg via INTRAVENOUS
  Administered 2017-12-30: 1.5 mg via INTRAVENOUS
  Administered 2017-12-30: 1 mg via INTRAVENOUS
  Administered 2017-12-30 (×3): 1.5 mg via INTRAVENOUS
  Administered 2017-12-30 – 2017-12-31 (×2): 1 mg via INTRAVENOUS
  Administered 2017-12-31: 1.5 mg via INTRAVENOUS
  Filled 2017-12-29: qty 1
  Filled 2017-12-29 (×2): qty 2
  Filled 2017-12-29: qty 1
  Filled 2017-12-29: qty 2
  Filled 2017-12-29: qty 1
  Filled 2017-12-29: qty 2
  Filled 2017-12-29: qty 1.5
  Filled 2017-12-29: qty 2
  Filled 2017-12-29: qty 1.5
  Filled 2017-12-29: qty 1
  Filled 2017-12-29: qty 2

## 2017-12-29 NOTE — Progress Notes (Signed)
Patient Demographics:    Tony [name], is a 52 y.o. male, DOB - 1966-05-07, WGN:[id number]  Admit date - 12/17/2017   Admitting Physician Angelia Mould, MD  Outpatient Primary MD for the patient is [name], MD  LOS - 12   Chief Complaint  Patient presents with  . Post-op Problem        Subjective:    Tony [name] today has no fevers, no emesis,  No chest pain, appetite is fair, had bm  Assessment  & Plan :    Principal Problem:   Adenocarcinoma (HCC) Active Problems:   S/P aortobifemoral bypass surgery   Lung mass   Postoperative wound infection   Cervical lymphadenopathy   DVT (deep venous thrombosis) (HCC)   PAD (peripheral artery disease) (HCC)   Gangrene of right foot (HCC)   AKI (acute kidney injury) ([place])   Uncontrolled pain   Palliative care encounter   Neck pain due to malignant neoplastic disease ([place])   Palliative care by specialist   Mass in neck  Brief Summary  52 year old man PMH critical limb ischemia, status post aortobifemoral bypass, right femoral artery to popliteal bypass, status post thrombectomy left limb aVF bypass admitted on 12/17/2017 with drainage from left groin wound and left below-knee incision and concerns about postop wound infection and cellulitis, patient also had neck pain imaging studies suggested a spiculated lung mass concerning for malignancy, UGI barium study does not show any evidence of possible upper GI malignancy   Plan:- 1)Poorly Differentiated Adenocarcinoma of unclear primary--suspect lung cancer in a smoker (Rt upper lobe) , UGI barium study does not show any evidence of possible upper GI malignancy,  d/w Dr Irene Limbo , patient will need to follow-up with oncology within a week of discharge to discuss treatment options post discharge from the hospital.  Palliative care consult to help with pain management appreciated,   pain control  remains challenging, patient is still requiring IV Dilaudid, did not tolerate PCA pump well, per palliative care recommendations fentanyl patch increased to 100 mcg every 72 hours, p.o. Dilaudid changed to 8 mg every 3 hours as needed, I will increase his gabapentin to 400 3 times daily,  2)Venous Thrombosis of the Right subclavian vein/right brachiocephalic vein/right internal jugular vein--as per Dr. Irene Limbo treat empirically with fondaparinux (Arixtra)  3)Left groin postop wound infection-resolving, complete Augmentin as ordered, vascular surgery input appreciated  4)Acute "Malignancy" related  pain--patient has significant neck pain suspect due to significant adenopathy associated with malignancy, palliative care consult to help with pain management, discussed with Dr. Irene Limbo if radiation may be an option for pain control,  pain control remains challenging, pain control as outlined #1 above  5)PAD with right foot gangrene--- per vascular surgery no need for aggressive approach,  await autoamputation, continue soaks in lukewarm dial soap water daily  6)Social--- plan of care discussed with patient, his daughter, and his wife Tony [name]  7)Anorexia/Anxiety--- continue Remeron 15 mg nightly as ordered, continue Marinol 2.5 twice a day with food, palliative care added lorazepam, taper down wean off Lexapro  Code Status : FULL CODE  Disposition Plan  : Possible discharge home in 1 to 2 days if able to achieve pain control with topical and oral opiates, with outpatient oncology  follow-up  Consults  : Oncology/vascular surgery/palliative care/ interventional  radiology  Procedures:  7/9 US guided right neck mass biopsy. Mx 18g core biopsy.  Antimicrobials:  Unaysn 7/11 >> 7/13  Augmentin 7/13 >>  Zosyn 7/8 >> 7/10  Vancomycin 7/8 >> 7/10  DVT Prophylaxis  : Fondaparinux/Arixtra  Lab Results  Component Value Date   PLT 435 (H) 12/29/2017   Inpatient Medications  Scheduled Meds: .  acetaminophen  1,000 mg Oral TID  . amoxicillin-clavulanate  1 tablet Oral Q12H  . atorvastatin  40 mg Oral Daily  . dronabinol  2.5 mg Oral BID AC  . [START ON 12/30/2017] escitalopram  10 mg Oral Daily  . feeding supplement  1 Container Oral TID BM  . fentaNYL  100 mcg Transdermal Q72H  . fondaparinux (ARIXTRA) injection  7.5 mg Subcutaneous q1800  . gabapentin  400 mg Oral TID  . lidocaine  1 patch Transdermal Q24H  . LORazepam  0.5 mg Oral TID  . mirtazapine  15 mg Oral QHS  . ondansetron  4 mg Oral Q6H  . pantoprazole  40 mg Oral Daily  . senna  1 tablet Oral QHS  . sodium chloride flush  3 mL Intravenous Q12H   Continuous Infusions: . sodium chloride    . sodium chloride     PRN Meds:.sodium chloride, sodium chloride, alum & mag hydroxide-simeth, bisacodyl, guaiFENesin-dextromethorphan, hydrALAZINE, HYDROmorphone (DILAUDID) injection, HYDROmorphone, labetalol, LORazepam, naLOXone (NARCAN)  injection, phenol, polyethylene glycol, promethazine, sodium chloride flush   Anti-infectives (From admission, onward)   Start     Dose/Rate Route Frequency Ordered Stop   12/22/17 1300  amoxicillin-clavulanate (AUGMENTIN) 875-125 MG per tablet 1 tablet     1 tablet Oral Every 12 hours 12/22/17 1219     12/20/17 1400  Ampicillin-Sulbactam (UNASYN) 3 g in sodium chloride 0.9 % 100 mL IVPB  Status:  Discontinued     3 g 200 mL/hr over 30 Minutes Intravenous Every 6 hours 12/20/17 1145 12/22/17 1219   12/20/17 0000  vancomycin (VANCOCIN) 500 mg in sodium chloride 0.9 % 100 mL IVPB  Status:  Discontinued     500 mg 100 mL/hr over 60 Minutes Intravenous Every 12 hours 12/19/17 1500 12/20/17 1145   12/18/17 0300  vancomycin (VANCOCIN) IVPB 1000 mg/200 mL premix  Status:  Discontinued     1,000 mg 200 mL/hr over 60 Minutes Intravenous Every 12 hours 12/17/17 1354 12/19/17 1500   12/17/17 2000  piperacillin-tazobactam (ZOSYN) IVPB 3.375 g  Status:  Discontinued     3.375 g 12.5 mL/hr over 240  Minutes Intravenous Every 8 hours 12/17/17 1354 12/20/17 1145   12/17/17 1430  piperacillin-tazobactam (ZOSYN) IVPB 3.375 g     3.375 g 100 mL/hr over 30 Minutes Intravenous  Once 12/17/17 1351 12/17/17 1916   12/17/17 1430  vancomycin (VANCOCIN) 1,750 mg in sodium chloride 0.9 % 500 mL IVPB     1,750 mg 250 mL/hr over 120 Minutes Intravenous  Once 12/17/17 1351 12/17/17 1747        Objective:   Vitals:   12/27/17 2126 12/28/17 0718 12/28/17 1910 12/29/17 0328  BP: (!) 170/96 (!) 163/78 (!) 150/88 (!) 153/95  Pulse: (!) 107   77  Resp:   12 16  Temp: 98 F (36.7 C) 99.3 F (37.4 C) 98.9 F (37.2 C) 98.9 F (37.2 C)  TempSrc: Oral Oral Oral Oral  SpO2: 95% 94% 98% 93%  Weight:      Height:  Wt Readings from Last 3 Encounters:  12/18/17 71.6 kg (157 lb 13.6 oz)  12/04/17 81.6 kg (179 lb 14.3 oz)  11/28/17 81.6 kg (180 lb)    No intake or output data in the 24 hours ending 12/29/17 1522   Physical Exam  Gen:- Awake Alert,  HEENT:- [place].AT, No sclera icterus Neck--adenopathy/swelling with some induration tenderness and erythema/inflammatory type changes lungs-  CTAB , fair air movement CV- S1, S2 normal Abd-  +ve B.Sounds, Abd Soft, No tenderness,    Extremity/Skin:-Right foot dry gangrene, bilateral upper extremity edema right more than left Psych-affect is flat , oriented x3 Neuro-no new focal deficits, no tremors   Data Review:   Micro Results No results found for this or any previous visit (from the past 240 hour(s)).  Radiology Reports Ct Soft Tissue Neck W Contrast  Result Date: 12/17/2017 CLINICAL DATA:  52 y/o M; recently removed right neck PICC line with swelling and pain in the region of PICC line. EXAM: CT NECK WITH CONTRAST TECHNIQUE: Multidetector CT imaging of the neck was performed using the standard protocol following the bolus administration of intravenous contrast. CONTRAST:  100 cc Omnipaque 300 COMPARISON:  None. FINDINGS: Pharynx and  larynx: Normal. No mass or swelling. Salivary glands: No inflammation, mass, or stone. Thyroid: Normal. Lymph nodes: Right cervical, bilateral supraclavicular, and upper mediastinal bulky lymphadenopathy. A right upper paratracheal lymph node measures 5.0 x 3.5 cm (series 3, image 109), left supraclavicular node measures 3.1 x 1.7 cm (series 3, image 85), and a right supraclavicular node measures 1.6 x 2.1 cm (series 3, image 68). Vascular: Deep venous thrombosis involving right subclavian vein, right brachiocephalic vein, and right internal jugular vein. Limited intracranial: Negative. Visualized orbits: Negative. Mastoids and visualized paranasal sinuses: Small right maxillary sinus mucous retention cyst. Skeleton: No acute or aggressive process. Upper chest: Spiculated nodule in the right upper lobe measuring 16 x 12 mm (series 3, image 115). Other: Mild edema in the right supraclavicular fossa and lower neck. IMPRESSION: 1. Deep venous thrombosis involving the right subclavian vein, right brachiocephalic vein, and right internal jugular vein. 2. Edema in the right lower neck and supraclavicular fossa is probably related to venous congestion from DVT and/or phlebitis. No abscess. 3. Severe bulky lymphadenopathy of right cervical, bilateral supraclavicular, and upper mediastinal lymph nodes likely representing metastatic disease or lymphoproliferative disease. 4. Spiculated right upper lobe nodule measuring up to 16 mm. Consider one of the following in 3 months for both low-risk and high-risk individuals: (a) repeat chest CT, (b) follow-up PET-CT, or (c) tissue sampling. This recommendation follows the consensus statement: Guidelines for Management of Incidental Pulmonary Nodules Detected on CT Images: From the Fleischner Society 2017; Radiology 2017; 284:228-243. These results will be called to the ordering clinician or representative by the Radiologist Assistant, and communication documented in the PACS or  [employer] Dashboard. Electronically Signed   By: Kristine Garbe M.D.   On: 12/17/2017 15:04   Ct Chest W Contrast  Result Date: 12/25/2017 CLINICAL DATA:  Spiculated lung mass and adenopathy shown on neck CT, for further workup. EXAM: CT CHEST, ABDOMEN, AND PELVIS WITH CONTRAST TECHNIQUE: Multidetector CT imaging of the chest, abdomen and pelvis was performed following the standard protocol during bolus administration of intravenous contrast. CONTRAST:  125mL OMNIPAQUE IOHEXOL 300 MG/ML  SOLN COMPARISON:  12/17/2017 and CT abdomen from 08/14/2011 there is some mild fatty infiltration in segment IV adjacent to the falciform ligament. Otherwise unremarkable. FINDINGS: CT CHEST FINDINGS Cardiovascular: Aortic arch and  branch vessel atherosclerotic vascular disease. Poor visualization of contrast in the right internal jugular vein, compatible with patient's known venous thrombosis. Mediastinum/Nodes: Bilateral lower neck level IV adenopathy with ill definition. A left level Roman numeral 4 lymph node measures 2.0 cm in short axis on image 2/3. There is also bilateral supraclavicular adenopathy including a 1.9 cm right-sided supraclavicular node on image 7/3. Bulky paratracheal, prevascular, subcarinal, and right hilar adenopathy noted. An index right paratracheal node measures 3.6 cm in short axis on image 16/3. Enlarged right subpectoral lymph nodes are present along with scattered right axillary lymph nodes. Mild stranding in the anterior mediastinum. Lungs/Pleura: Moderate right and small left pleural effusion are present and are nonspecific for transudative versus exudative etiology. A spiculated right upper lobe nodule measures 2.4 by 1.3 by 1.4 cm, roughly stable from 12/17/2017. There is passive atelectasis in both lower lobes, right greater than left. Paraseptal emphysema noted. Musculoskeletal: Notable subcutaneous edema throughout the chest, somewhat confluent along the right upper chest. CT  ABDOMEN PELVIS FINDINGS Hepatobiliary: Unremarkable Pancreas: Unremarkable Spleen: Unremarkable Adrenals/Urinary Tract: Unremarkable Stomach/Bowel: Air-fluid levels in the rectum indicating diarrheal process. Borderline wall thickening several segments of proximal small bowel. Vascular/Lymphatic: Aortoiliac atherosclerotic vascular disease. Aortobifemoral graft small fluid collection superficial to the right groin likely representing residual hematoma from prior groin puncture, but only measuring about 2.4 by 2.1 by 4.3 cm (volume = 11 cm^3). No gas in this collection. Left inguinal lymph node 0.9 cm in short axis. Reproductive: Unremarkable Other: Diffuse subcutaneous edema. Small amount of pelvic ascites. Gas tracking along the anterior abdominal wall subcutaneous tissues, correlate with any anterior abdominal wall injections. Subcutaneous edema extends along the pubis and towards the penis. Musculoskeletal: Unremarkable IMPRESSION: 1. Bulky adenopathy in the neck and upper chest. Persistent 2.4 cm spiculated right upper lobe nodule. The degree of adenopathy seems disproportionate to the size of the nodule, and this could represent adenopathy related to lung cancer or potentially 2 separate processes such as lymphoproliferative disease and a separate primary lung cancer. An infectious cause for the pulmonary nodule is less likely. 2. Extensive third spacing of fluid in the chest, abdomen and pelvis. Query hypoproteinemia or hypoalbuminemia. 3. Poor visualization of contrast in the right internal jugular vein compatible with the patient's known venous thrombosis. 4. Air fluid levels in the rectum indicating diarrheal process. Borderline wall thickening in the proximal small bowel, query proximal enteritis. 5. The small fluid collection superficial to the right groin puncture site is only about 11 cc in volume, and probably reflects a resolving hematoma. 6. Gas tracking along anterior abdominal wall subcutaneous  tissues, probably from injections-correlate with any anterior abdominal wall subcutaneous injections recently. Electronically Signed   By: Van Clines M.D.   On: 12/25/2017 00:05   Mr Jeri Cos JJ Contrast  Result Date: 12/25/2017 CLINICAL DATA:  52 year old male with right upper extremity and right central venous DVT with bulky cervical and thoracic lymphadenopathy and spiculated right apical lung nodule. EXAM: MRI HEAD WITHOUT AND WITH CONTRAST TECHNIQUE: Multiplanar, multiecho pulse sequences of the brain and surrounding structures were obtained without and with intravenous contrast. CONTRAST:  24mL MULTIHANCE GADOBENATE DIMEGLUMINE 529 MG/ML IV SOLN COMPARISON:  Neck CT 12/17/2017. Head CT without contrast 08/03/2006. Neck and chest CT FINDINGS: Brain: No midline shift, mass effect, or evidence of intracranial mass lesion. No abnormal enhancement identified. No dural thickening. No restricted diffusion to suggest acute infarction. No ventriculomegaly, extra-axial collection or acute intracranial hemorrhage. Cervicomedullary junction and pituitary are within normal  limits. Pear[place] Cables and white matter signal is within normal limits for age throughout the brain; Minimal nonspecific left frontal lobe white matter T2 and FLAIR hyperintensity (series 6, image 19). No cortical encephalomalacia or chronic cerebral blood products. Vascular: Major intracranial vascular flow voids are preserved. The major dural venous sinuses are enhancing and appear patent. The right IJ bulb at the skull base is patent. Skull and upper cervical spine: Negative visible cervical spine and spinal cord. Visualized bone marrow signal is within normal limits. Sinuses/Orbits: Negative orbits soft tissues. Paranasal sinuses and mastoids are stable and well pneumatized. Other: Visible internal auditory structures appear normal. Partially visible bulky right neck lymphadenopathy (series 4, image 14). IMPRESSION: 1. No cerebral metastatic disease  or acute intracranial abnormality identified. 2. Right cervical lymphadenopathy redemonstrated. Electronically Signed   By: Genevie Ann M.D.   On: 12/25/2017 09:17   Ct Abdomen Pelvis W Contrast  Result Date: 12/25/2017 CLINICAL DATA:  Spiculated lung mass and adenopathy shown on neck CT, for further workup. EXAM: CT CHEST, ABDOMEN, AND PELVIS WITH CONTRAST TECHNIQUE: Multidetector CT imaging of the chest, abdomen and pelvis was performed following the standard protocol during bolus administration of intravenous contrast. CONTRAST:  160mL OMNIPAQUE IOHEXOL 300 MG/ML  SOLN COMPARISON:  12/17/2017 and CT abdomen from 08/14/2011 there is some mild fatty infiltration in segment IV adjacent to the falciform ligament. Otherwise unremarkable. FINDINGS: CT CHEST FINDINGS Cardiovascular: Aortic arch and branch vessel atherosclerotic vascular disease. Poor visualization of contrast in the right internal jugular vein, compatible with patient's known venous thrombosis. Mediastinum/Nodes: Bilateral lower neck level IV adenopathy with ill definition. A left level Roman numeral 4 lymph node measures 2.0 cm in short axis on image 2/3. There is also bilateral supraclavicular adenopathy including a 1.9 cm right-sided supraclavicular node on image 7/3. Bulky paratracheal, prevascular, subcarinal, and right hilar adenopathy noted. An index right paratracheal node measures 3.6 cm in short axis on image 16/3. Enlarged right subpectoral lymph nodes are present along with scattered right axillary lymph nodes. Mild stranding in the anterior mediastinum. Lungs/Pleura: Moderate right and small left pleural effusion are present and are nonspecific for transudative versus exudative etiology. A spiculated right upper lobe nodule measures 2.4 by 1.3 by 1.4 cm, roughly stable from 12/17/2017. There is passive atelectasis in both lower lobes, right greater than left. Paraseptal emphysema noted. Musculoskeletal: Notable subcutaneous edema throughout  the chest, somewhat confluent along the right upper chest. CT ABDOMEN PELVIS FINDINGS Hepatobiliary: Unremarkable Pancreas: Unremarkable Spleen: Unremarkable Adrenals/Urinary Tract: Unremarkable Stomach/Bowel: Air-fluid levels in the rectum indicating diarrheal process. Borderline wall thickening several segments of proximal small bowel. Vascular/Lymphatic: Aortoiliac atherosclerotic vascular disease. Aortobifemoral graft small fluid collection superficial to the right groin likely representing residual hematoma from prior groin puncture, but only measuring about 2.4 by 2.1 by 4.3 cm (volume = 11 cm^3). No gas in this collection. Left inguinal lymph node 0.9 cm in short axis. Reproductive: Unremarkable Other: Diffuse subcutaneous edema. Small amount of pelvic ascites. Gas tracking along the anterior abdominal wall subcutaneous tissues, correlate with any anterior abdominal wall injections. Subcutaneous edema extends along the pubis and towards the penis. Musculoskeletal: Unremarkable IMPRESSION: 1. Bulky adenopathy in the neck and upper chest. Persistent 2.4 cm spiculated right upper lobe nodule. The degree of adenopathy seems disproportionate to the size of the nodule, and this could represent adenopathy related to lung cancer or potentially 2 separate processes such as lymphoproliferative disease and a separate primary lung cancer. An infectious cause for the pulmonary nodule is  less likely. 2. Extensive third spacing of fluid in the chest, abdomen and pelvis. Query hypoproteinemia or hypoalbuminemia. 3. Poor visualization of contrast in the right internal jugular vein compatible with the patient's known venous thrombosis. 4. Air fluid levels in the rectum indicating diarrheal process. Borderline wall thickening in the proximal small bowel, query proximal enteritis. 5. The small fluid collection superficial to the right groin puncture site is only about 11 cc in volume, and probably reflects a resolving hematoma.  6. Gas tracking along anterior abdominal wall subcutaneous tissues, probably from injections-correlate with any anterior abdominal wall subcutaneous injections recently. Electronically Signed   By: Van Clines M.D.   On: 12/25/2017 00:05   Ir US Guide Bx Asp/drain  Result Date: 12/18/2017 INDICATION: 52 year old male with a history of neck mass EXAM: IR ULTRASOUND GUIDANCE MEDICATIONS: None. ANESTHESIA/SEDATION: Moderate (conscious) sedation was employed during this procedure. A total of Versed 2.0 mg and Fentanyl 100 mcg was administered intravenously. Moderate Sedation Time: 12 minutes. The patient's level of consciousness and vital signs were monitored continuously by radiology nursing throughout the procedure under my direct supervision. FLUOROSCOPY TIME:  None COMPLICATIONS: None PROCEDURE: Informed written consent was obtained from the patient after a thorough discussion of the procedural risks, benefits and alternatives. All questions were addressed. Maximal Sterile Barrier Technique was utilized including caps, mask, sterile gowns, sterile gloves, sterile drape, hand hygiene and skin antiseptic. A timeout was performed prior to the initiation of the procedure. Patient positioned supine position on the ultrasound table. Ultrasound images of the right neck were performed with images stored sent to PACs. Patient is then prepped and draped in the usual sterile fashion. 1% lidocaine was used for local anesthesia. Using ultrasound guidance, multiple 18 gauge core biopsy performed of right neck mass. Tissue specimen placed in the saline. Sterile bandage was placed. Patient tolerated the procedure well and remained hemodynamically stable throughout. No complications were encountered and no significant blood loss. IMPRESSION: Status post ultrasound-guided biopsy of right neck mass. Signed, Dulcy Fanny. Dellia Nims, RPVI [facility] Radiology Electronically Signed    By: Corrie Mckusick D.O.   On: 12/18/2017 17:00   Dg Ang/ext/uni/or Left  Result Date: 12/01/2017 CLINICAL DATA:  Intraoperative arteriogram of the left lower leg EXAM: LEFT ANG/EXT/UNI/ OR FLUOROSCOPY TIME:  Not provided COMPARISON:  None FINDINGS: A single spot intraoperative radiographic image of the left knee provided for review Intra-arterial contrast has been injected from a more proximal location. Images demonstrate the sequela of presumably reverse venous distal femoral bypass grafting. The distal anastomosis appears widely patent without hemodynamically significant stenosis. The distal most aspect of the left superficial femoral artery as well as the left above and below knee popliteal artery appear widely patent. There are mixed occlusive and nonocclusive filling defect within the tibioperoneal trunk with an apparent single vessel runoff to the left lower leg via the posterior tibial artery. Skin staples are noted about the medial aspect of the left lower leg with associated scattered foci of subcutaneous. IMPRESSION: Distal anastomosis presumably reverse venous distal femoral bypass grafting appears widely patent, however there are mixed occlusive and nonocclusive filling defects with tibioperoneal trunk and apparent single-vessel runoff to the left lower leg via the posterior tibial artery. Electronically Signed   By: Sandi Mariscal M.D.   On: 12/01/2017 14:18   Dg Duanne Limerick W/small Bowel  Result Date: 12/28/2017 CLINICAL DATA:  Metastatic adenocarcinoma of unknown primary. EXAM: UPPER GI SERIES WITH SMALL BOWEL FOLLOW-THROUGH FLUOROSCOPY TIME:  Fluoroscopy Time:  3 minutes 6 seconds TECHNIQUE: Combined double contrast and single contrast upper GI series using effervescent crystals, thick barium, and thin barium. Subsequently, serial images of the small bowel were obtained including spot views of the terminal ileum. COMPARISON:  CT scan dated 12/24/2017 FINDINGS: The esophagus appears normal. The fundus,  body, and antrum of the stomach are normal. Pylorus is normal. There is slight edema of the mucosa of the duodenal bulb but the bulb expands and contracts without a mass. The jejunum and ileum appear normal including the terminal ileum. Small bowel transit time was 50 minutes, normal. IMPRESSION: Slight edema of the mucosa of the duodenal bulb. Otherwise normal upper GI and small-bowel follow-through. No visible mass lesions in the stomach or small bowel. Electronically Signed   By: Lorriane Shire M.D.   On: 12/28/2017 09:47     CBC Recent Labs  Lab 12/29/17 0315  WBC 9.4  HGB 7.8*  HCT 25.5*  PLT 435*  MCV 94.4  MCH 28.9  MCHC 30.6  RDW 14.3    Chemistries  Recent Labs  Lab 12/23/17 0431 12/24/17 1417 12/25/17 0542 12/26/17 0344 12/29/17 0315  NA 141 142 138 140 143  K 4.0 3.1* 4.3 4.0 3.6  CL 112* 119* 111 111 108  CO2 18* 17* 18* 20* 25  GLUCOSE 90 124* 92 113* 124*  BUN 10 8 8  5* 6  CREATININE 1.37* 1.04 1.31* 1.16 1.07  CALCIUM 8.6* 7.0* 8.5* 8.6* 8.4*  AST  --   --   --   --  19  ALT  --   --   --   --  14  ALKPHOS  --   --   --   --  82  BILITOT  --   --   --   --  0.5   ------------------------------------------------------------------------------------------------------------------ No results for input(s): CHOL, HDL, LDLCALC, TRIG, CHOLHDL, LDLDIRECT in the last 72 hours.  Lab Results  Component Value Date   HGBA1C 5.0 11/13/2017   ------------------------------------------------------------------------------------------------------------------ No results for input(s): TSH, T4TOTAL, T3FREE, THYROIDAB in the last 72 hours.  Invalid input(s): FREET3 ------------------------------------------------------------------------------------------------------------------ No results for input(s): VITAMINB12, FOLATE, FERRITIN, TIBC, IRON, RETICCTPCT in the last 72 hours.  Coagulation profile No results for input(s): INR, PROTIME in the last 168 hours.  No results  for input(s): DDIMER in the last 72 hours.  Cardiac Enzymes No results for input(s): CKMB, TROPONINI, MYOGLOBIN in the last 168 hours.  Invalid input(s): CK ------------------------------------------------------------------------------------------------------------------ No results found for: BNP   Roxan Hockey M.D on 12/29/2017 at 3:22 PM   Go to [email or web address] - password [email or web address] for contact info  [facility] - Office  [phone]

## 2017-12-29 NOTE — Progress Notes (Signed)
Daily Progress Note   Patient Name: Tony [name]       Date: 12/29/2017 DOB: Oct 15, 1965  Age: 52 y.o. MRN#: [id number] Attending Physician: Roxan Hockey, MD Primary Care Physician: [name], MD Admit Date: 12/17/2017  Reason for Consultation/Follow-up: Non pain symptom management, Pain control and Psychosocial/spiritual support  Subjective: Patient seen, chart reviewed.  Patient has utilized since approximately 1545 on 12/28/2017 9 mg of  IV Dilaudid, 12 mg of p.o. Dilaudid.  In reviewing the [facility] it appears as though pain becomes quite a bit worse at night.  Patient is averaging alternating IV with p.o. Dilaudid approximately every 2 hours.  When I see him this morning, he is rocking back and forth, holding his foot as well as his neck.  His wife is at the bedside.  He is not sedated  Patient does understand that IV pain medicine is keeping him in the hospital at this point but he states that he does not feel like  he can manage the pain the way it is at this point just with oral medications in the home setting  Length of Stay: 12  Current Medications: Scheduled Meds:  . acetaminophen  1,000 mg Oral TID  . amoxicillin-clavulanate  1 tablet Oral Q12H  . atorvastatin  40 mg Oral Daily  . dronabinol  2.5 mg Oral BID AC  . escitalopram  20 mg Oral Daily  . feeding supplement  1 Container Oral TID BM  . fentaNYL  100 mcg Transdermal Q72H  . fondaparinux (ARIXTRA) injection  7.5 mg Subcutaneous q1800  . gabapentin  300 mg Oral TID  . lidocaine  1 patch Transdermal Q24H  . LORazepam  0.5 mg Oral TID  . mirtazapine  15 mg Oral QHS  . ondansetron  4 mg Oral Q6H  . pantoprazole  40 mg Oral Daily  . senna  1 tablet Oral QHS  . sodium chloride flush  3 mL Intravenous Q12H     Continuous Infusions: . sodium chloride    . sodium chloride      PRN Meds: sodium chloride, sodium chloride, alum & mag hydroxide-simeth, bisacodyl, guaiFENesin-dextromethorphan, hydrALAZINE, HYDROmorphone (DILAUDID) injection, HYDROmorphone, labetalol, LORazepam, naLOXone (NARCAN)  injection, phenol, polyethylene glycol, promethazine, sodium chloride flush  Physical Exam  Constitutional: He is oriented to person, place, and time. He appears  well-developed and well-nourished.  HENT:  Head: Normocephalic and atraumatic.  Neck:  Right side of neck is edematous, erythema noticed; tender to palpation  Pulmonary/Chest: Effort normal.  Musculoskeletal: Normal range of motion.  Neurological: He is alert and oriented to person, place, and time.  Skin: Skin is warm and dry.  Psychiatric:  Patient appears anxious He is rocking Causing a lot of pain as well as anxiety  Nursing note and vitals reviewed.           Vital Signs: BP (!) 153/95 (BP Location: Left Arm)   Pulse 77   Temp 98.9 F (37.2 C) (Oral)   Resp 16   Ht 6\' 5"  (1.956 m)   Wt 71.6 kg (157 lb 13.6 oz)   SpO2 93%   BMI 18.72 kg/m  SpO2: SpO2: 93 % O2 Device: O2 Device: Room Air O2 Flow Rate: O2 Flow Rate (L/min): 2 L/min  Intake/output summary: No intake or output data in the 24 hours ending 12/29/17 1018 LBM: Last BM Date: 12/26/17 Baseline Weight: Weight: 71.6 kg (157 lb 13.6 oz) Most recent weight: Weight: 71.6 kg (157 lb 13.6 oz)       Palliative Assessment/Data:      Patient Active Problem List   Diagnosis Date Noted  . Neck pain due to malignant neoplastic disease ([place])   . Palliative care by specialist   . Uncontrolled pain   . Palliative care encounter   . Adenocarcinoma ([place]) 12/25/2017  . Gangrene of right foot ([place]) 12/20/2017  . AKI (acute kidney injury) ([place]) 12/20/2017  . PAD (peripheral artery disease) ([place]) 12/19/2017  . S/P aortobifemoral bypass surgery 12/17/2017  . Lung mass  12/17/2017  . Postoperative wound infection 12/17/2017  . Weight loss 12/17/2017  . Cervical lymphadenopathy 12/17/2017  . DVT (deep venous thrombosis) ([place]) 12/17/2017  . Aortoiliac occlusive disease ([place]) 11/15/2017  . Pain of right lower extremity due to ischemia 11/13/2017  . Hyperlipidemia LDL goal <70 11/13/2017  . PVD (peripheral vascular disease) ([place]) 11/11/2017  . H. pylori infection 10/11/2011  . Duodenal ulcer 08/23/2011  . IBS (irritable bowel syndrome) 08/18/2011  . Depression with anxiety 05/11/2009  . EPIGASTRIC PAIN, CHRONIC 05/11/2009  . SMOKER 09/01/2008  . INSOMNIA, CHRONIC 09/01/2008  . Essential hypertension 09/01/2008  . PALPITATIONS, CHRONIC 09/01/2008  . GASTRITIS 10/24/2007    Palliative Care Assessment & Plan   Patient Profile: 52 y.o.malewith past medical history of HTN, HLD, arterial occlusive disease, PVD, IBS, chronic back pain, anxiety, depressionadmitted on 7/8/2019with increased drainage from incision sites and with critical limb ischemia of left lower extremity.S/P Right Aortobifemoral bypass and fem-pop bypass 6/6 and found to thrombectomy with fem-pop bypass on left 6/22. Hospitalization complicated by uncontrolled pain likely s/t newly diagnosed metastatic adenocarcinoma with significant lymphadenopathy to neck. Also with venous thrombus of right subclavian, brachiocephalic, and IJ veins.  Consult ordered for assistance with pain management.  Patient has been started on fentanyl transdermal but continues on IV Dilaudid as well as oral Dilaudid for breakthrough.   Assessment: Pain medicine calculations: 9 mg Dilaudid IV since 1545 12/28/2017 equals 180 mg of oral morphine divided by 2 equals approximately 90 mcg of fentanyl; 12 mg of oral Dilaudid equals 48 mg of oral morphine; divided by 2 additional 24  mcg of fentanyl with approximate conversion to 114 mcg  Recommendations/Plan:  We will uptitrate fentanyl patch to 100 mcg every 72  hours.  Continue with IV Dilaudid 1 to 1.5 mg every 2 hours as needed  for breakthrough pain with the plan to transition to oral medication and to enable patient to be discharged hopefully by the first of the week.  Will up oral Dilaudid breakthrough pain medicine to 8 mg every 3 hours as needed  Patient appears to have an unmanaged aspect of anxiety with his pain.  He has been on Ativan in the home setting prior to admission.  We will schedule Ativan 0.5 mg every 8 hours and continue 1 mg every 8 hours as needed for breakthrough anxiety.  Continue with gabapentin 300 mg 3 times daily.  This can be uptitrated for further effect in terms of neuropathic pain treatment  Goals of Care and Additional Recommendations:  Limitations on Scope of Treatment: Full Scope Treatment  Code Status:    Code Status Orders  (From admission, onward)        Start     Ordered   12/17/17 1330  Full code  Continuous     12/17/17 1334    Code Status History    Date Active Date Inactive Code Status Order ID Comments User Context   12/01/2017 1504 12/05/2017 2116 Full Code [id number]  Rosetta Posner, MD Inpatient   11/13/2017 0925 11/26/2017 1601 Full Code [id number]  Karmen Bongo, MD ED   11/11/2017 0208 11/11/2017 1345 Full Code [id number]  Angelia Mould, MD Inpatient   11/11/2017 0154 11/11/2017 0208 Full Code [id number]  Angelia Mould, MD ED   08/22/2011 0013 08/23/2011 1813 Full Code [id number]  Sterrett, Idelle Jo, RN Inpatient       Prognosis:   Unable to determine  Discharge Planning:  To Be Determined  Care plan was discussed with Dr. Domingo Cocking  Thank you for allowing the Palliative Medicine Team to assist in the care of this patient.   Time In: 0900 Time Out: 0945 Total Time 45 min Prolonged Time Billed  no       Greater than 50%  of this time was spent counseling and coordinating care related to the above assessment and plan.  Dory Horn, NP  Please contact Palliative  Medicine Team phone at [phone] for questions and concerns.

## 2017-12-30 DIAGNOSIS — G893 Neoplasm related pain (acute) (chronic): Secondary | ICD-10-CM

## 2017-12-30 DIAGNOSIS — D649 Anemia, unspecified: Secondary | ICD-10-CM

## 2017-12-30 DIAGNOSIS — F419 Anxiety disorder, unspecified: Secondary | ICD-10-CM

## 2017-12-30 DIAGNOSIS — C801 Malignant (primary) neoplasm, unspecified: Secondary | ICD-10-CM

## 2017-12-30 DIAGNOSIS — R64 Cachexia: Secondary | ICD-10-CM

## 2017-12-30 DIAGNOSIS — Z6822 Body mass index (BMI) 22.0-22.9, adult: Secondary | ICD-10-CM

## 2017-12-30 DIAGNOSIS — D473 Essential (hemorrhagic) thrombocythemia: Secondary | ICD-10-CM

## 2017-12-30 MED ORDER — DEXAMETHASONE SODIUM PHOSPHATE 4 MG/ML IJ SOLN
4.0000 mg | Freq: Three times a day (TID) | INTRAMUSCULAR | Status: DC
  Administered 2017-12-30 – 2018-01-09 (×31): 4 mg via INTRAVENOUS
  Filled 2017-12-30 (×33): qty 1

## 2017-12-30 MED ORDER — HYDRALAZINE HCL 20 MG/ML IJ SOLN: 10.0000 mg | INTRAMUSCULAR | Status: DC | PRN

## 2017-12-30 MED ORDER — DULOXETINE HCL 20 MG PO CPEP
20.0000 mg | ORAL_CAPSULE | Freq: Every day | ORAL | Status: DC
Start: 2017-12-30 — End: 2018-01-01
  Administered 2017-12-30 – 2018-01-01 (×3): 20 mg via ORAL
  Filled 2017-12-30 (×3): qty 1

## 2017-12-30 MED ORDER — LORAZEPAM 1 MG PO TABS
1.0000 mg | ORAL_TABLET | Freq: Three times a day (TID) | ORAL | Status: DC
  Administered 2017-12-30 – 2018-01-07 (×26): 1 mg via ORAL
  Filled 2017-12-30 (×26): qty 1

## 2017-12-30 NOTE — Progress Notes (Signed)
Report called to [name]. Pt planning to transport to [facility] bed 38.  Pt and wife updated on bed assignment and plan of care.

## 2017-12-30 NOTE — Progress Notes (Signed)
Pt placed on tele until transfer to [facility] notified

## 2017-12-30 NOTE — Progress Notes (Signed)
Daily Progress Note   Patient Name: Tony [name]       Date: 12/30/2017 DOB: Jan 27, 1966  Age: 52 y.o. MRN#: [id number] Attending Physician: Roxan Hockey, MD Primary Care Physician: [name], MD Admit Date: 12/17/2017  Reason for Consultation/Follow-up: Pain control and Psychosocial/spiritual support  Subjective: Patient seen, chart reviewed.  Patient is still endorsing pain at its worst over the past 24 hours at an 8-9 and at best a 4.  He is comfortable at rest but with any movement of his head or ambulation he experiences increased pain.  There has been no adverse effects in terms of medication adjustments to Dilaudid as well as gabapentin such as he is not sedated but he is not verbalizing much improvement  Right side of his neck is very edematous as is his right arm.  He is verbalizing hoarseness as well as a mild sore throat.  Length of Stay: 13  Current Medications: Scheduled Meds:  . acetaminophen  1,000 mg Oral TID  . amoxicillin-clavulanate  1 tablet Oral Q12H  . atorvastatin  40 mg Oral Daily  . dexamethasone  4 mg Intravenous TID  . dronabinol  2.5 mg Oral BID AC  . DULoxetine  20 mg Oral Daily  . escitalopram  10 mg Oral Daily  . feeding supplement  1 Container Oral TID BM  . fentaNYL  100 mcg Transdermal Q72H  . fondaparinux (ARIXTRA) injection  7.5 mg Subcutaneous q1800  . gabapentin  400 mg Oral TID  . lidocaine  1 patch Transdermal Q24H  . LORazepam  1 mg Oral TID  . mirtazapine  15 mg Oral QHS  . ondansetron  4 mg Oral Q6H  . pantoprazole  40 mg Oral Daily  . senna  1 tablet Oral QHS  . sodium chloride flush  3 mL Intravenous Q12H    Continuous Infusions: . sodium chloride    . sodium chloride      PRN Meds: sodium chloride, sodium chloride,  alum & mag hydroxide-simeth, bisacodyl, guaiFENesin-dextromethorphan, hydrALAZINE, HYDROmorphone (DILAUDID) injection, HYDROmorphone, labetalol, LORazepam, naLOXone (NARCAN)  injection, phenol, polyethylene glycol, promethazine, sodium chloride flush  Physical Exam  Constitutional: He is oriented to person, place, and time. He appears well-developed and well-nourished.  Neck:  Decreased range of motion to neck Erythematous as well as edematous, right side of  neck  Cardiovascular: Normal rate.  Pulmonary/Chest: Effort normal.  Abdominal: Soft.  Musculoskeletal:  Toes to right foot black   Neurological: He is alert and oriented to person, place, and time.  Skin: Skin is warm and dry.  Skin discoloration to the right side of the neck down upper right chest wall  Psychiatric:  Patient is anxious and overwhelmed by pain as well as new cancer diagnosis No psychosis, no SI no HI  Nursing note and vitals reviewed.           Vital Signs: BP (!) 178/94 (BP Location: Right Arm)   Pulse (!) 101   Temp 98.9 F (37.2 C) (Oral)   Resp 18   Ht 6\' 5"  (1.956 m)   Wt 71.6 kg (157 lb 13.6 oz)   SpO2 98%   BMI 18.72 kg/m  SpO2: SpO2: 98 % O2 Device: O2 Device: Room Air O2 Flow Rate: O2 Flow Rate (L/min): 2 L/min  Intake/output summary:   Intake/Output Summary (Last 24 hours) at 12/30/2017 1151 Last data filed at 12/30/2017 0600 Gross per 24 hour  Intake -  Output 400 ml  Net -400 ml   LBM: Last BM Date: 12/28/17 Baseline Weight: Weight: 71.6 kg (157 lb 13.6 oz) Most recent weight: Weight: 71.6 kg (157 lb 13.6 oz)       Palliative Assessment/Data:      Patient Active Problem List   Diagnosis Date Noted  . Mass in neck   . Neck pain due to malignant neoplastic disease ([place])   . Palliative care by specialist   . Uncontrolled pain   . Palliative care encounter   . Adenocarcinoma ([place]) 12/25/2017  . Gangrene of right foot ([place]) 12/20/2017  . AKI (acute kidney injury) ([place])  12/20/2017  . PAD (peripheral artery disease) ([place]) 12/19/2017  . S/P aortobifemoral bypass surgery 12/17/2017  . Lung mass 12/17/2017  . Postoperative wound infection 12/17/2017  . Weight loss 12/17/2017  . Cervical lymphadenopathy 12/17/2017  . DVT (deep venous thrombosis) ([place]) 12/17/2017  . Aortoiliac occlusive disease ([place]) 11/15/2017  . Pain of right lower extremity due to ischemia 11/13/2017  . Hyperlipidemia LDL goal <70 11/13/2017  . PVD (peripheral vascular disease) ([place]) 11/11/2017  . H. pylori infection 10/11/2011  . Duodenal ulcer 08/23/2011  . IBS (irritable bowel syndrome) 08/18/2011  . Depression with anxiety 05/11/2009  . EPIGASTRIC PAIN, CHRONIC 05/11/2009  . SMOKER 09/01/2008  . INSOMNIA, CHRONIC 09/01/2008  . Essential hypertension 09/01/2008  . PALPITATIONS, CHRONIC 09/01/2008  . GASTRITIS 10/24/2007    Palliative Care Assessment & Plan   Patient Profile: 52 y.o.malewith past medical history of HTN, HLD, arterial occlusive disease, PVD, IBS, chronic back pain, anxiety, depressionadmitted on 7/8/2019with increased drainage from incision sites and with critical limb ischemia of left lower extremity.S/P Right Aortobifemoral bypass and fem-pop bypass 6/6 and found to thrombectomy with fem-pop bypass on left 6/22. Hospitalization complicated by uncontrolled pain likely s/t newly diagnosed metastatic adenocarcinoma with significant lymphadenopathy to neck. Also with venous thrombus of right subclavian, brachiocephalic, and IJ veins.  Consult ordered for assistance with pain management.  Patient has been started on fentanyl transdermal but continues on IV Dilaudid as well as oral Dilaudid for breakthrough.  Patient is being transferred to [facility] for evaluation of potential SVC, radiation   Assessment: Per medication administration record review patient has utilized a total of 24 mg of oral Dilaudid as well as 7 mg IV Dilaudid in the past 24  hours.  This  is slightly decreased from 12/29/2017.  As noted above, he is still endorsing severe pain and does not feel that he can manage pain without an IV option at this point  Additionally I am concerned about his right upper extremity, potentially SVC.  I have staffed this with Dr. Domingo Cocking as well as Dr. Denton Brick.  Dr. Denton Brick has notified oncology and Dr. Alvy Bimler  has rounded on the patient and is recommending transfer to [facility] to begin treatment in patient  Recommendations/Plan:  Continue fentanyl patch at 100 mcg every 72 hours as well as Dilaudid IV 1 to 1-1/2 mg every 2 hours as needed for breakthrough pain, alternating with oral Dilaudid 8 mg every 3 hours as needed for breakthrough pain.  Continue gabapentin 400 mg 3 times a day.  Dr. Alvy Bimler  has started Decadron for anti-inflammatory effect.  Patient continues on Tylenol 1000 mg 3 times a day  Patient has been very overwhelmed by his new cancer diagnosis as well as unmanaged pain.  Will increase scheduled Ativan to 1 mg 3 times a day  Recommend cross titration of Lexapro and Cymbalta.  Lexapro has been decreased to 10 mg daily and I have added Cymbalta 20 mg daily.  You should be able to cross titrate off of Lexapro in about 3 to 4 days and simultaneously uptitrate Cymbalta to therapeutic level of 60 mg daily.  This drug might  help mood as well as neuropathic pain  Goals of Care and Additional Recommendations:  Limitations on Scope of Treatment: Full Scope Treatment  Code Status:    Code Status Orders  (From admission, onward)        Start     Ordered   12/17/17 1330  Full code  Continuous     12/17/17 1334    Code Status History    Date Active Date Inactive Code Status Order ID Comments User Context   12/01/2017 1504 12/05/2017 2116 Full Code [id number]  Rosetta Posner, MD Inpatient   11/13/2017 0925 11/26/2017 1601 Full Code [id number]  Karmen Bongo, MD ED   11/11/2017 0208 11/11/2017 1345 Full Code [id number]  Angelia Mould, MD Inpatient   11/11/2017 0154 11/11/2017 0208 Full Code [id number]  Angelia Mould, MD ED   08/22/2011 0013 08/23/2011 1813 Full Code [id number]  Sterrett, Idelle Jo, RN Inpatient       Prognosis:   Unable to determine  Discharge Planning:  To Be Determined  Care plan was discussed with Dr. Domingo Cocking, Dr. Denton Brick, and Dr. Alvy Bimler  Thank you for allowing the Palliative Medicine Team to assist in the care of this patient.   Time In: 0900 Time Out: 1030 Total Time 90 min Prolonged Time Billed  yes       Greater than 50%  of this time was spent counseling and coordinating care related to the above assessment and plan.  Dory Horn, NP  Please contact Palliative Medicine Team phone at [phone] for questions and concerns.

## 2017-12-30 NOTE — Progress Notes (Signed)
Patient Demographics:    Tony [name], is a 52 y.o. male, DOB - Jan 15, 1966, ZOX:[id number]  Admit date - 12/17/2017   Admitting Physician Angelia Mould, MD  Outpatient Primary MD for the patient is [name], MD  LOS - 13   Chief Complaint  Patient presents with  . Post-op Problem        Subjective:    Tony [name] today has no fevers, no emesis,  No chest pain, appetite is fair, pain control remains very challenging, patient is okay with transfer to [name] long hospital for possible radiation treatments to help with SVC syndrome and pain management  Assessment  & Plan :    Principal Problem:   Adenocarcinoma ([place]) Active Problems:   S/P aortobifemoral bypass surgery   Lung mass   Postoperative wound infection   Cervical lymphadenopathy   DVT (deep venous thrombosis) (HCC)   PAD (peripheral artery disease) (HCC)   Gangrene of right foot ([place])   AKI (acute kidney injury) ([place])   Uncontrolled pain   Palliative care encounter   Neck pain due to malignant neoplastic disease ([place])   Palliative care by specialist   Mass in neck  Brief Summary  52 year old man PMH critical limb ischemia, status post aortobifemoral bypass, right femoral artery to popliteal bypass, status post thrombectomy left limb aVF bypass admitted on 12/17/2017 with drainage from left groin wound and left below-knee incision and concerns about postop wound infection and cellulitis, patient also had neck pain imaging studies suggested a spiculated lung mass concerning for malignancy, UGI barium study does not show any evidence of possible upper GI malignancy. transfer to [facility] long hospital for possible radiation treatments to help with SVC syndrome and pain management   Plan:- 1)Poorly Differentiated Adenocarcinoma of unclear primary--suspect lung cancer in a smoker (Rt upper lobe) , UGI barium study does not show  any evidence of possible upper GI malignancy,  d/w Dr Irene Limbo and Dr Alvy Bimler , transfer to [facility] long hospital for possible radiation treatments to help with SVC syndrome and pain management,   Palliative care consult to help with pain management appreciated,   pain control remains challenging, patient is still requiring IV Dilaudid, did not tolerate PCA pump well, per palliative care recommendations fentanyl patch increased to 100 mcg every 72 hours, p.o. Dilaudid changed to 8 mg every 3 hours as needed, c/n Gabapentin to 400 mg  3 times daily.  Dr. [name] has started patient on steroids, anticipate some leukocytosis with steroid use  2)SVC Syndrome in the setting of Venous Thrombosis of the Right subclavian vein/right brachiocephalic vein/right internal jugular vein--as per Dr. Irene Limbo treat empirically with fondaparinux (Arixtra), d/w Dr [name], will transfer to [facility] long hospital for possible radiation treatments to help with SVC syndrome and pain management, Dr Tammi Klippel (radiation oncology to see).   3)Left groin postop wound infection-resolved, completed Augmenti, vascular surgery input appreciated  4)Acute "Malignancy" related  pain--patient has significant neck pain suspect due to significant adenopathy associated with malignancy, palliative care consult to help with pain management, discussed with Dr. Irene Limbo , d/w Dr [name], will transfer to [facility] long hospital for possible radiation treatments to help with SVC syndrome and pain management, Dr Tammi Klippel (radiation oncology to see). ,  pain control remains challenging, pain control as outlined #1 above  5)PAD with right foot gangrene--- per vascular surgery no need for aggressive approach,  await autoamputation, continue soaks in lukewarm dial soap water daily  6)Social--- plan of care discussed with patient, his daughter, and his wife [name]  7)Anorexia/Anxiety--- continue Remeron 15 mg nightly as ordered, continue Marinol 2.5 twice a day  with food, palliative care added lorazepam, taper down wean off Lexapro, consider cymbalta   8) anemia--- multifactorial etiology, hematology input appreciated, IV iron infusion  versus transfusion as per hematologist  Code Status : FULL CODE  Disposition Plan  :  d/w Dr [name], will transfer to [facility] long hospital for possible radiation treatments to help with SVC syndrome and pain management, Dr Tammi Klippel (radiation oncology to see).   Consults  : Oncology/vascular surgery/palliative care/ interventional  radiology  Procedures:  7/9 US guided right neck mass biopsy. Mx 18g core biopsy.  Antimicrobials:  Unaysn 7/11 >> 7/13  Augmentin 7/13 >>  Zosyn 7/8 >> 7/10  Vancomycin 7/8 >> 7/10  DVT Prophylaxis  : Fondaparinux/Arixtra  Lab Results  Component Value Date   PLT 435 (H) 12/29/2017   Inpatient Medications  Scheduled Meds: . acetaminophen  1,000 mg Oral TID  . amoxicillin-clavulanate  1 tablet Oral Q12H  . atorvastatin  40 mg Oral Daily  . dexamethasone  4 mg Intravenous TID  . dronabinol  2.5 mg Oral BID AC  . DULoxetine  20 mg Oral Daily  . escitalopram  10 mg Oral Daily  . feeding supplement  1 Container Oral TID BM  . fentaNYL  100 mcg Transdermal Q72H  . fondaparinux (ARIXTRA) injection  7.5 mg Subcutaneous q1800  . gabapentin  400 mg Oral TID  . lidocaine  1 patch Transdermal Q24H  . LORazepam  1 mg Oral TID  . mirtazapine  15 mg Oral QHS  . ondansetron  4 mg Oral Q6H  . pantoprazole  40 mg Oral Daily  . senna  1 tablet Oral QHS  . sodium chloride flush  3 mL Intravenous Q12H   Continuous Infusions: . sodium chloride    . sodium chloride     PRN Meds:.sodium chloride, sodium chloride, alum & mag hydroxide-simeth, bisacodyl, guaiFENesin-dextromethorphan, hydrALAZINE, HYDROmorphone (DILAUDID) injection, HYDROmorphone, labetalol, LORazepam, naLOXone (NARCAN)  injection, phenol, polyethylene glycol, promethazine, sodium chloride  flush   Anti-infectives (From admission, onward)   Start     Dose/Rate Route Frequency Ordered Stop   12/22/17 1300  amoxicillin-clavulanate (AUGMENTIN) 875-125 MG per tablet 1 tablet     1 tablet Oral Every 12 hours 12/22/17 1219     12/20/17 1400  Ampicillin-Sulbactam (UNASYN) 3 g in sodium chloride 0.9 % 100 mL IVPB  Status:  Discontinued     3 g 200 mL/hr over 30 Minutes Intravenous Every 6 hours 12/20/17 1145 12/22/17 1219   12/20/17 0000  vancomycin (VANCOCIN) 500 mg in sodium chloride 0.9 % 100 mL IVPB  Status:  Discontinued     500 mg 100 mL/hr over 60 Minutes Intravenous Every 12 hours 12/19/17 1500 12/20/17 1145   12/18/17 0300  vancomycin (VANCOCIN) IVPB 1000 mg/200 mL premix  Status:  Discontinued     1,000 mg 200 mL/hr over 60 Minutes Intravenous Every 12 hours 12/17/17 1354 12/19/17 1500   12/17/17 2000  piperacillin-tazobactam (ZOSYN) IVPB 3.375 g  Status:  Discontinued     3.375 g 12.5 mL/hr over 240 Minutes Intravenous Every 8 hours 12/17/17 1354 12/20/17 1145  12/17/17 1430  piperacillin-tazobactam (ZOSYN) IVPB 3.375 g     3.375 g 100 mL/hr over 30 Minutes Intravenous  Once 12/17/17 1351 12/17/17 1916   12/17/17 1430  vancomycin (VANCOCIN) 1,750 mg in sodium chloride 0.9 % 500 mL IVPB     1,750 mg 250 mL/hr over 120 Minutes Intravenous  Once 12/17/17 1351 12/17/17 1747        Objective:   Vitals:   12/29/17 1548 12/29/17 2034 12/29/17 2307 12/30/17 0419  BP: (!) 155/97 (!) 154/85  (!) 178/94  Pulse: (!) 105 97  (!) 101  Resp: 18 18  18   Temp: 99.1 F (37.3 C) 98.7 F (37.1 C) 97.8 F (36.6 C) 98.9 F (37.2 C)  TempSrc: Oral Oral Oral Oral  SpO2: 94% 94%  98%  Weight:      Height:        Wt Readings from Last 3 Encounters:  12/18/17 71.6 kg (157 lb 13.6 oz)  12/04/17 81.6 kg (179 lb 14.3 oz)  11/28/17 81.6 kg (180 lb)     Intake/Output Summary (Last 24 hours) at 12/30/2017 1504 Last data filed at 12/30/2017 0600 Gross per 24 hour  Intake -   Output 400 ml  Net -400 ml     Physical Exam  Gen:- Awake Alert,  HEENT:- [place].AT, No sclera icterus Neck-- Significant, bulky lymphadenopathy with swelling over the right neck / adenopathy/swelling with some induration/firmness, tenderness and erythema/inflammatory type changes, no streaking or cellulitis type findings, no fluctuance or abscess type findings lungs-  CTAB , fair air movement CV- S1, S2 normal Abd-  +ve B.Sounds, Abd Soft, No tenderness,    Extremity/Skin:-Right foot dry gangrene, bilateral upper extremity edema right more than left Psych-affect is anxious , oriented x3 Neuro-no new focal deficits, no tremors   Data Review:   Micro Results No results found for this or any previous visit (from the past 240 hour(s)).  Radiology Reports Ct Soft Tissue Neck W Contrast  Result Date: 12/17/2017 CLINICAL DATA:  52 y/o M; recently removed right neck PICC line with swelling and pain in the region of PICC line. EXAM: CT NECK WITH CONTRAST TECHNIQUE: Multidetector CT imaging of the neck was performed using the standard protocol following the bolus administration of intravenous contrast. CONTRAST:  100 cc Omnipaque 300 COMPARISON:  None. FINDINGS: Pharynx and larynx: Normal. No mass or swelling. Salivary glands: No inflammation, mass, or stone. Thyroid: Normal. Lymph nodes: Right cervical, bilateral supraclavicular, and upper mediastinal bulky lymphadenopathy. A right upper paratracheal lymph node measures 5.0 x 3.5 cm (series 3, image 109), left supraclavicular node measures 3.1 x 1.7 cm (series 3, image 85), and a right supraclavicular node measures 1.6 x 2.1 cm (series 3, image 68). Vascular: Deep venous thrombosis involving right subclavian vein, right brachiocephalic vein, and right internal jugular vein. Limited intracranial: Negative. Visualized orbits: Negative. Mastoids and visualized paranasal sinuses: Small right maxillary sinus mucous retention cyst. Skeleton: No acute or  aggressive process. Upper chest: Spiculated nodule in the right upper lobe measuring 16 x 12 mm (series 3, image 115). Other: Mild edema in the right supraclavicular fossa and lower neck. IMPRESSION: 1. Deep venous thrombosis involving the right subclavian vein, right brachiocephalic vein, and right internal jugular vein. 2. Edema in the right lower neck and supraclavicular fossa is probably related to venous congestion from DVT and/or phlebitis. No abscess. 3. Severe bulky lymphadenopathy of right cervical, bilateral supraclavicular, and upper mediastinal lymph nodes likely representing metastatic disease or lymphoproliferative disease. 4. Spiculated  right upper lobe nodule measuring up to 16 mm. Consider one of the following in 3 months for both low-risk and high-risk individuals: (a) repeat chest CT, (b) follow-up PET-CT, or (c) tissue sampling. This recommendation follows the consensus statement: Guidelines for Management of Incidental Pulmonary Nodules Detected on CT Images: From the Fleischner Society 2017; Radiology 2017; 284:228-243. These results will be called to the ordering clinician or representative by the Radiologist Assistant, and communication documented in the PACS or zVision Dashboard. Electronically Signed   By: Kristine Garbe M.D.   On: 12/17/2017 15:04   Ct Chest W Contrast  Result Date: 12/25/2017 CLINICAL DATA:  Spiculated lung mass and adenopathy shown on neck CT, for further workup. EXAM: CT CHEST, ABDOMEN, AND PELVIS WITH CONTRAST TECHNIQUE: Multidetector CT imaging of the chest, abdomen and pelvis was performed following the standard protocol during bolus administration of intravenous contrast. CONTRAST:  120mL OMNIPAQUE IOHEXOL 300 MG/ML  SOLN COMPARISON:  12/17/2017 and CT abdomen from 08/14/2011 there is some mild fatty infiltration in segment IV adjacent to the falciform ligament. Otherwise unremarkable. FINDINGS: CT CHEST FINDINGS Cardiovascular: Aortic arch and branch  vessel atherosclerotic vascular disease. Poor visualization of contrast in the right internal jugular vein, compatible with patient's known venous thrombosis. Mediastinum/Nodes: Bilateral lower neck level IV adenopathy with ill definition. A left level Roman numeral 4 lymph node measures 2.0 cm in short axis on image 2/3. There is also bilateral supraclavicular adenopathy including a 1.9 cm right-sided supraclavicular node on image 7/3. Bulky paratracheal, prevascular, subcarinal, and right hilar adenopathy noted. An index right paratracheal node measures 3.6 cm in short axis on image 16/3. Enlarged right subpectoral lymph nodes are present along with scattered right axillary lymph nodes. Mild stranding in the anterior mediastinum. Lungs/Pleura: Moderate right and small left pleural effusion are present and are nonspecific for transudative versus exudative etiology. A spiculated right upper lobe nodule measures 2.4 by 1.3 by 1.4 cm, roughly stable from 12/17/2017. There is passive atelectasis in both lower lobes, right greater than left. Paraseptal emphysema noted. Musculoskeletal: Notable subcutaneous edema throughout the chest, somewhat confluent along the right upper chest. CT ABDOMEN PELVIS FINDINGS Hepatobiliary: Unremarkable Pancreas: Unremarkable Spleen: Unremarkable Adrenals/Urinary Tract: Unremarkable Stomach/Bowel: Air-fluid levels in the rectum indicating diarrheal process. Borderline wall thickening several segments of proximal small bowel. Vascular/Lymphatic: Aortoiliac atherosclerotic vascular disease. Aortobifemoral graft small fluid collection superficial to the right groin likely representing residual hematoma from prior groin puncture, but only measuring about 2.4 by 2.1 by 4.3 cm (volume = 11 cm^3). No gas in this collection. Left inguinal lymph node 0.9 cm in short axis. Reproductive: Unremarkable Other: Diffuse subcutaneous edema. Small amount of pelvic ascites. Gas tracking along the anterior  abdominal wall subcutaneous tissues, correlate with any anterior abdominal wall injections. Subcutaneous edema extends along the pubis and towards the penis. Musculoskeletal: Unremarkable IMPRESSION: 1. Bulky adenopathy in the neck and upper chest. Persistent 2.4 cm spiculated right upper lobe nodule. The degree of adenopathy seems disproportionate to the size of the nodule, and this could represent adenopathy related to lung cancer or potentially 2 separate processes such as lymphoproliferative disease and a separate primary lung cancer. An infectious cause for the pulmonary nodule is less likely. 2. Extensive third spacing of fluid in the chest, abdomen and pelvis. Query hypoproteinemia or hypoalbuminemia. 3. Poor visualization of contrast in the right internal jugular vein compatible with the patient's known venous thrombosis. 4. Air fluid levels in the rectum indicating diarrheal process. Borderline wall thickening in the  proximal small bowel, query proximal enteritis. 5. The small fluid collection superficial to the right groin puncture site is only about 11 cc in volume, and probably reflects a resolving hematoma. 6. Gas tracking along anterior abdominal wall subcutaneous tissues, probably from injections-correlate with any anterior abdominal wall subcutaneous injections recently. Electronically Signed   By: Van Clines M.D.   On: 12/25/2017 00:05   Mr Jeri Cos VQ Contrast  Result Date: 12/25/2017 CLINICAL DATA:  52 year old male with right upper extremity and right central venous DVT with bulky cervical and thoracic lymphadenopathy and spiculated right apical lung nodule. EXAM: MRI HEAD WITHOUT AND WITH CONTRAST TECHNIQUE: Multiplanar, multiecho pulse sequences of the brain and surrounding structures were obtained without and with intravenous contrast. CONTRAST:  73mL MULTIHANCE GADOBENATE DIMEGLUMINE 529 MG/ML IV SOLN COMPARISON:  Neck CT 12/17/2017. Head CT without contrast 08/03/2006. Neck and  chest CT FINDINGS: Brain: No midline shift, mass effect, or evidence of intracranial mass lesion. No abnormal enhancement identified. No dural thickening. No restricted diffusion to suggest acute infarction. No ventriculomegaly, extra-axial collection or acute intracranial hemorrhage. Cervicomedullary junction and pituitary are within normal limits. [place] Cables and white matter signal is within normal limits for age throughout the brain; Minimal nonspecific left frontal lobe white matter T2 and FLAIR hyperintensity (series 6, image 19). No cortical encephalomalacia or chronic cerebral blood products. Vascular: Major intracranial vascular flow voids are preserved. The major dural venous sinuses are enhancing and appear patent. The right IJ bulb at the skull base is patent. Skull and upper cervical spine: Negative visible cervical spine and spinal cord. Visualized bone marrow signal is within normal limits. Sinuses/Orbits: Negative orbits soft tissues. Paranasal sinuses and mastoids are stable and well pneumatized. Other: Visible internal auditory structures appear normal. Partially visible bulky right neck lymphadenopathy (series 4, image 14). IMPRESSION: 1. No cerebral metastatic disease or acute intracranial abnormality identified. 2. Right cervical lymphadenopathy redemonstrated. Electronically Signed   By: Genevie Ann M.D.   On: 12/25/2017 09:17   Ct Abdomen Pelvis W Contrast  Result Date: 12/25/2017 CLINICAL DATA:  Spiculated lung mass and adenopathy shown on neck CT, for further workup. EXAM: CT CHEST, ABDOMEN, AND PELVIS WITH CONTRAST TECHNIQUE: Multidetector CT imaging of the chest, abdomen and pelvis was performed following the standard protocol during bolus administration of intravenous contrast. CONTRAST:  146mL OMNIPAQUE IOHEXOL 300 MG/ML  SOLN COMPARISON:  12/17/2017 and CT abdomen from 08/14/2011 there is some mild fatty infiltration in segment IV adjacent to the falciform ligament. Otherwise unremarkable.  FINDINGS: CT CHEST FINDINGS Cardiovascular: Aortic arch and branch vessel atherosclerotic vascular disease. Poor visualization of contrast in the right internal jugular vein, compatible with patient's known venous thrombosis. Mediastinum/Nodes: Bilateral lower neck level IV adenopathy with ill definition. A left level Roman numeral 4 lymph node measures 2.0 cm in short axis on image 2/3. There is also bilateral supraclavicular adenopathy including a 1.9 cm right-sided supraclavicular node on image 7/3. Bulky paratracheal, prevascular, subcarinal, and right hilar adenopathy noted. An index right paratracheal node measures 3.6 cm in short axis on image 16/3. Enlarged right subpectoral lymph nodes are present along with scattered right axillary lymph nodes. Mild stranding in the anterior mediastinum. Lungs/Pleura: Moderate right and small left pleural effusion are present and are nonspecific for transudative versus exudative etiology. A spiculated right upper lobe nodule measures 2.4 by 1.3 by 1.4 cm, roughly stable from 12/17/2017. There is passive atelectasis in both lower lobes, right greater than left. Paraseptal emphysema noted. Musculoskeletal: Notable subcutaneous edema  throughout the chest, somewhat confluent along the right upper chest. CT ABDOMEN PELVIS FINDINGS Hepatobiliary: Unremarkable Pancreas: Unremarkable Spleen: Unremarkable Adrenals/Urinary Tract: Unremarkable Stomach/Bowel: Air-fluid levels in the rectum indicating diarrheal process. Borderline wall thickening several segments of proximal small bowel. Vascular/Lymphatic: Aortoiliac atherosclerotic vascular disease. Aortobifemoral graft small fluid collection superficial to the right groin likely representing residual hematoma from prior groin puncture, but only measuring about 2.4 by 2.1 by 4.3 cm (volume = 11 cm^3). No gas in this collection. Left inguinal lymph node 0.9 cm in short axis. Reproductive: Unremarkable Other: Diffuse subcutaneous  edema. Small amount of pelvic ascites. Gas tracking along the anterior abdominal wall subcutaneous tissues, correlate with any anterior abdominal wall injections. Subcutaneous edema extends along the pubis and towards the penis. Musculoskeletal: Unremarkable IMPRESSION: 1. Bulky adenopathy in the neck and upper chest. Persistent 2.4 cm spiculated right upper lobe nodule. The degree of adenopathy seems disproportionate to the size of the nodule, and this could represent adenopathy related to lung cancer or potentially 2 separate processes such as lymphoproliferative disease and a separate primary lung cancer. An infectious cause for the pulmonary nodule is less likely. 2. Extensive third spacing of fluid in the chest, abdomen and pelvis. Query hypoproteinemia or hypoalbuminemia. 3. Poor visualization of contrast in the right internal jugular vein compatible with the patient's known venous thrombosis. 4. Air fluid levels in the rectum indicating diarrheal process. Borderline wall thickening in the proximal small bowel, query proximal enteritis. 5. The small fluid collection superficial to the right groin puncture site is only about 11 cc in volume, and probably reflects a resolving hematoma. 6. Gas tracking along anterior abdominal wall subcutaneous tissues, probably from injections-correlate with any anterior abdominal wall subcutaneous injections recently. Electronically Signed   By: Van Clines M.D.   On: 12/25/2017 00:05   Ir US Guide Bx Asp/drain  Result Date: 12/18/2017 INDICATION: 52 year old male with a history of neck mass EXAM: IR ULTRASOUND GUIDANCE MEDICATIONS: None. ANESTHESIA/SEDATION: Moderate (conscious) sedation was employed during this procedure. A total of Versed 2.0 mg and Fentanyl 100 mcg was administered intravenously. Moderate Sedation Time: 12 minutes. The patient's level of consciousness and vital signs were monitored continuously by radiology nursing throughout the procedure under  my direct supervision. FLUOROSCOPY TIME:  None COMPLICATIONS: None PROCEDURE: Informed written consent was obtained from the patient after a thorough discussion of the procedural risks, benefits and alternatives. All questions were addressed. Maximal Sterile Barrier Technique was utilized including caps, mask, sterile gowns, sterile gloves, sterile drape, hand hygiene and skin antiseptic. A timeout was performed prior to the initiation of the procedure. Patient positioned supine position on the ultrasound table. Ultrasound images of the right neck were performed with images stored sent to PACs. Patient is then prepped and draped in the usual sterile fashion. 1% lidocaine was used for local anesthesia. Using ultrasound guidance, multiple 18 gauge core biopsy performed of right neck mass. Tissue specimen placed in the saline. Sterile bandage was placed. Patient tolerated the procedure well and remained hemodynamically stable throughout. No complications were encountered and no significant blood loss. IMPRESSION: Status post ultrasound-guided biopsy of right neck mass. Signed, Dulcy Fanny. Dellia Nims, RPVI [facility] Radiology Electronically Signed   By: Corrie Mckusick D.O.   On: 12/18/2017 17:00   Dg Ang/ext/uni/or Left  Result Date: 12/01/2017 CLINICAL DATA:  Intraoperative arteriogram of the left lower leg EXAM: LEFT ANG/EXT/UNI/ OR FLUOROSCOPY TIME:  Not provided COMPARISON:  None FINDINGS: A single spot intraoperative  radiographic image of the left knee provided for review Intra-arterial contrast has been injected from a more proximal location. Images demonstrate the sequela of presumably reverse venous distal femoral bypass grafting. The distal anastomosis appears widely patent without hemodynamically significant stenosis. The distal most aspect of the left superficial femoral artery as well as the left above and below knee popliteal artery appear widely patent.  There are mixed occlusive and nonocclusive filling defect within the tibioperoneal trunk with an apparent single vessel runoff to the left lower leg via the posterior tibial artery. Skin staples are noted about the medial aspect of the left lower leg with associated scattered foci of subcutaneous. IMPRESSION: Distal anastomosis presumably reverse venous distal femoral bypass grafting appears widely patent, however there are mixed occlusive and nonocclusive filling defects with tibioperoneal trunk and apparent single-vessel runoff to the left lower leg via the posterior tibial artery. Electronically Signed   By: Sandi Mariscal M.D.   On: 12/01/2017 14:18   Dg Duanne Limerick W/small Bowel  Result Date: 12/28/2017 CLINICAL DATA:  Metastatic adenocarcinoma of unknown primary. EXAM: UPPER GI SERIES WITH SMALL BOWEL FOLLOW-THROUGH FLUOROSCOPY TIME:  Fluoroscopy Time:  3 minutes 6 seconds TECHNIQUE: Combined double contrast and single contrast upper GI series using effervescent crystals, thick barium, and thin barium. Subsequently, serial images of the small bowel were obtained including spot views of the terminal ileum. COMPARISON:  CT scan dated 12/24/2017 FINDINGS: The esophagus appears normal. The fundus, body, and antrum of the stomach are normal. Pylorus is normal. There is slight edema of the mucosa of the duodenal bulb but the bulb expands and contracts without a mass. The jejunum and ileum appear normal including the terminal ileum. Small bowel transit time was 50 minutes, normal. IMPRESSION: Slight edema of the mucosa of the duodenal bulb. Otherwise normal upper GI and small-bowel follow-through. No visible mass lesions in the stomach or small bowel. Electronically Signed   By: Lorriane Shire M.D.   On: 12/28/2017 09:47     CBC Recent Labs  Lab 12/29/17 0315  WBC 9.4  HGB 7.8*  HCT 25.5*  PLT 435*  MCV 94.4  MCH 28.9  MCHC 30.6  RDW 14.3    Chemistries  Recent Labs  Lab 12/24/17 1417 12/25/17 0542  12/26/17 0344 12/29/17 0315  NA 142 138 140 143  K 3.1* 4.3 4.0 3.6  CL 119* 111 111 108  CO2 17* 18* 20* 25  GLUCOSE 124* 92 113* 124*  BUN 8 8 5* 6  CREATININE 1.04 1.31* 1.16 1.07  CALCIUM 7.0* 8.5* 8.6* 8.4*  AST  --   --   --  19  ALT  --   --   --  14  ALKPHOS  --   --   --  82  BILITOT  --   --   --  0.5   ------------------------------------------------------------------------------------------------------------------ No results for input(s): CHOL, HDL, LDLCALC, TRIG, CHOLHDL, LDLDIRECT in the last 72 hours.  Lab Results  Component Value Date   HGBA1C 5.0 11/13/2017   ------------------------------------------------------------------------------------------------------------------ No results for input(s): TSH, T4TOTAL, T3FREE, THYROIDAB in the last 72 hours.  Invalid input(s): FREET3 ------------------------------------------------------------------------------------------------------------------ No results for input(s): VITAMINB12, FOLATE, FERRITIN, TIBC, IRON, RETICCTPCT in the last 72 hours.  Coagulation profile No results for input(s): INR, PROTIME in the last 168 hours.  No results for input(s): DDIMER in the last 72 hours.  Cardiac Enzymes No results for input(s): CKMB, TROPONINI, MYOGLOBIN in the last 168 hours.  Invalid input(s): CK ------------------------------------------------------------------------------------------------------------------ No  results found for: BNP   Roxan Hockey M.D on 12/30/2017 at 3:04 PM   Go to [email or web address] - password [email or web address] for contact info  [facility] - Office  [phone]

## 2017-12-30 NOTE — Progress Notes (Signed)
Pt pending transfer to [facility]. Report called to Cindy,RN. Pt assigned to [address] bed 03.   RN informed that tele pt's cannot be accepted to this floor  MD paged.

## 2017-12-30 NOTE — Progress Notes (Signed)
Tony [name]   DOB:05-30-1966   CX#:[id number]    Assessment & Plan:  Poorly differentiated carcinoma, likely lung primary, with evidence of SVC syndrome I felt that it is unlikely that the patient will get better without further treatment I recommend starting him on some steroid therapy and to urgently arrange for transfer to [facility] hospital for consideration for inpatient treatment I have placed urgent consultation to radiation oncologist today to consider urgent radiation therapy I discussed with the patient the rationale of such strategy and also reviewed the plan of care with his wife over the telephone The primary service is informed to help facilitate transfer to [facility] hospital  Severe cancer pain I appreciate assistance from palliative care service for pain management I felt that intravenous pain medicine will be helpful to get his pain under control  Significant anxiety  he is prescribed lorazepam with some beneficial effect  Malignant cachexia with severe protein calorie malnutrition He has some benefit from Marinol I felt that additional of dexamethasone should also improve his appetite  Anemia with thrombocytosis Recent iron studies suggested a component of iron deficiency This is likely multifactorial, and along with anemia chronic illness He may benefit from either IV iron versus blood transfusion I will defer to Dr. Irene Limbo to discuss whether blood transfusion versus intravenous iron is helpful Due to plan for transfer to another hospital, I felt that it is appropriate to defer that decision until tomorrow  Goals of care discussion We discussed the rationale of starting urgent radiation treatment I would defer systemic chemotherapy plan to his oncologist  Discharge planning Transfer of care to [facility] long hospital   [name], MD 12/30/2017  10:54 AM   Subjective:  I was requested by hospitalist to evaluate this patient for urgent management of SVC  syndrome.  The palliative care service is also available to provide additional history I have reviewed his records extensively The patient was diagnosed with poorly differentiated carcinoma, likely lung primary, with significant diffuse lymphadenopathy of the right side of the neck causing severe, uncontrolled pain, right upper extremity swelling and limited movement.  He also complained of changes in his voice as well as neck pain on certain movement.  He also have difficulties with swallowing and reportedly over 20 pound weight loss. The patient have background history of severe peripheral vascular disease status post revascularization procedure. Since admission to the hospital, he has received intravenous pain medicine for pain control.  Despite aggressive supportive care, the primary service was not able to get the pain under control to be discharged and to receive outpatient therapy. He has some mild shortness of breath at rest He appears very anxious today After receiving intravenous pain medicine, his pain is rated as minimum at this present time.  Objective:  Vitals:   12/29/17 2307 12/30/17 0419  BP:  (!) 178/94  Pulse:  (!) 101  Resp:  18  Temp: 97.8 F (36.6 C) 98.9 F (37.2 C)  SpO2:  98%     Intake/Output Summary (Last 24 hours) at 12/30/2017 1054 Last data filed at 12/30/2017 0600 Gross per 24 hour  Intake -  Output 400 ml  Net -400 ml    GENERAL:alert, no distress and comfortable SKIN: Noted erythematous skin lesion over the right side of the neck EYES: normal, Conjunctiva are pink and non-injected, sclera clear OROPHARYNX:no exudate, no erythema and lips, buccal mucosa, and tongue normal  NECK: Significant, bulky lymphadenopathy with swelling over the right neck  lYMPH:  he has palpable lymphadenopathy in the right neck, without palpable lymphadenopathy elsewhere LUNGS: clear to auscultation and percussion with normal breathing effort HEART: regular rate & rhythm and  no murmurs with significant diffuse right upper extremity edema ABDOMEN:abdomen soft, non-tender and normal bowel sounds Musculoskeletal:no cyanosis of digits and no clubbing.  His lower extremities are not examined. NEURO: alert & oriented x 3 with fluent speech, no focal motor/sensory deficits   Labs:  Lab Results  Component Value Date   WBC 9.4 12/29/2017   HGB 7.8 (L) 12/29/2017   HCT 25.5 (L) 12/29/2017   MCV 94.4 12/29/2017   PLT 435 (H) 12/29/2017   NEUTROABS 5.2 12/17/2017    Lab Results  Component Value Date   NA 143 12/29/2017   K 3.6 12/29/2017   CL 108 12/29/2017   CO2 25 12/29/2017

## 2017-12-30 NOTE — Progress Notes (Signed)
Attempted to call report to [facility].

## 2017-12-30 NOTE — Progress Notes (Signed)
  Radiation Oncology         [phone] ________________________________  Name: Tony [name] MRN: [id number]  Date: 12/17/2017  DOB: 06-18-1965  Chart Note:  I reviewed this patient's most recent findings and wanted to take a minute to document my impression.  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  He will be transferred to [facility] and potentially start radiation therapy on an urgent basis.  ________________________________  Sheral Apley Tammi Klippel, M.D.

## 2017-12-31 ENCOUNTER — Other Ambulatory Visit: Payer: Self-pay | Admitting: Hematology

## 2017-12-31 ENCOUNTER — Ambulatory Visit
Admit: 2017-12-31 | Discharge: 2017-12-31 | Disposition: A | Discharge status: Home / Self Care | Payer: BLUE CROSS/BLUE SHIELD | Attending: Radiation Oncology | Admitting: Radiation Oncology

## 2017-12-31 DIAGNOSIS — I871 Compression of vein: Secondary | ICD-10-CM | POA: Insufficient documentation

## 2017-12-31 DIAGNOSIS — C3492 Malignant neoplasm of unspecified part of left bronchus or lung: Secondary | ICD-10-CM | POA: Insufficient documentation

## 2017-12-31 DIAGNOSIS — T8149XA Infection following a procedure, other surgical site, initial encounter: Secondary | ICD-10-CM

## 2017-12-31 DIAGNOSIS — C801 Malignant (primary) neoplasm, unspecified: Secondary | ICD-10-CM

## 2017-12-31 DIAGNOSIS — I739 Peripheral vascular disease, unspecified: Secondary | ICD-10-CM

## 2017-12-31 DIAGNOSIS — I82401 Acute embolism and thrombosis of unspecified deep veins of right lower extremity: Secondary | ICD-10-CM

## 2017-12-31 DIAGNOSIS — D630 Anemia in neoplastic disease: Secondary | ICD-10-CM

## 2017-12-31 MED ORDER — HYDROMORPHONE HCL 1 MG/ML IJ SOLN
2.0000 mg | INTRAMUSCULAR | Status: DC | PRN
Start: 2017-12-31 — End: 2017-12-31
  Administered 2017-12-31 (×2): 2 mg via INTRAVENOUS
  Filled 2017-12-31 (×2): qty 2

## 2017-12-31 MED ORDER — HYDROMORPHONE HCL 1 MG/ML IJ SOLN
1.0000 mg | INTRAMUSCULAR | Status: DC | PRN
  Administered 2017-12-31 – 2018-01-03 (×25): 1 mg via INTRAVENOUS
  Filled 2017-12-31 (×26): qty 1

## 2017-12-31 MED ORDER — METOPROLOL SUCCINATE ER 100 MG PO TB24
100.0000 mg | ORAL_TABLET | Freq: Every day | ORAL | Status: DC
  Administered 2017-12-31 – 2018-01-22 (×23): 100 mg via ORAL
  Filled 2017-12-31 (×23): qty 1

## 2017-12-31 MED ORDER — HYDROMORPHONE HCL 4 MG PO TABS
10.0000 mg | ORAL_TABLET | ORAL | Status: DC | PRN
  Administered 2018-01-01 – 2018-01-06 (×13): 10 mg via ORAL
  Filled 2017-12-31 (×11): qty 5
  Filled 2017-12-31 (×2): qty 3

## 2017-12-31 MED ORDER — HYDROMORPHONE HCL 2 MG PO TABS
10.0000 mg | ORAL_TABLET | ORAL | Status: DC | PRN
  Administered 2017-12-31: 10 mg via ORAL
  Filled 2017-12-31 (×2): qty 5

## 2017-12-31 NOTE — Progress Notes (Signed)
PT Cancellation Note  Patient Details Name: JABRON WEESE MRN: [id number] DOB: 01-26-1966   Cancelled Treatment:    Reason Eval/Treat Not Completed: Patient at procedure or test/unavailable Radiation   Zayonna Ayuso,KATHrine E 12/31/2017, 10:41 AM Carmelia Bake, PT, DPT 12/31/2017 Pager: [phone]

## 2017-12-31 NOTE — Progress Notes (Signed)
START OFF PATHWAY REGIMEN - Non-Small Cell Lung   [id number]:Carboplatin + Paclitaxel (2/50) + RT weekly x 6 weeks:   Administer weekly during RT:     Paclitaxel      Carboplatin   **Always confirm dose/schedule in your pharmacy ordering system**  Patient Characteristics: Stage IV Metastatic, Nonsquamous, Initial Chemotherapy/Immunotherapy, PS = 0, 1, ALK Translocation Negative/Unknown and EGFR Mutation Negative/Non-Sensitizing/Unknown, Not a Candidate for Immunotherapy AJCC T Category: TX Current Disease Status: Distant Metastases AJCC N Category: N3 AJCC M Category: M1a AJCC 8 Stage Grouping: IVA Histology: Nonsquamous Cell ROS1 Rearrangement Status: Awaiting Test Results T790M Mutation Status: Not Applicable - EGFR Mutation Negative/Unknown Other Mutations/Biomarkers: No Other Actionable Mutations NTRK Gene Fusion Status: Awaiting Test Results PD-L1 Expression Status: Awaiting Test Results Chemotherapy/Immunotherapy LOT: Initial Chemotherapy/Immunotherapy Molecular Targeted Therapy: Not Appropriate ALK Translocation Status: Awaiting Test Results EGFR Mutation Status: Awaiting Test Results BRAF V600E Mutation Status: Awaiting Test Results Performance Status: PS = 0, 1 Immunotherapy Candidate Status: Not a Candidate for Immunotherapy Intent of Therapy: Non-Curative / Palliative Intent, Discussed with Patient

## 2017-12-31 NOTE — Consult Note (Signed)
Radiation Oncology         [phone] ________________________________  Initial inpatient Consultation  Name: Tony [name] MRN: [id number]  Date of Service: 12/31/17       DOB: 06/28/1965  IP:[id number], Dibas, MD  No ref. provider found   REFERRING PHYSICIAN: No ref. provider found  DIAGNOSIS: The primary encounter diagnosis was SVC syndrome. Diagnoses of Post-op pain, Neck swelling, Mass in neck, and Adenocarcinoma ([place]) were also pertinent to this visit.    ICD-10-CM   1. SVC syndrome I87.1 dexamethasone (DECADRON) injection 4 mg  2. Post-op pain G89.18   3. Neck swelling R22.1   4. Mass in neck R22.1 IR US Guide Bx Asp/Drain    IR US Guide Bx Asp/Drain    CANCELED: CT FNA BIOPSY 1ST LESION    CANCELED: CT FNA BIOPSY 1ST LESION  5. Adenocarcinoma (H[facility]) C80.1 DG UGI W/SMALL BOWEL    DG UGI W/SMALL BOWEL    HISTORY OF PRESENT ILLNESS: Tony [name] is a 52 y.o. male seen at the request of Dr. Irene Limbo for a new diagnosis of adenocarcinoma, most likely NSCLC of the lung with SVC Syndrome. Mr. Scipio has been noting increasing pain in his right great toe and has had issues with PVD requiring recent femoral artery bypass grafting. He presented to the ED on 12/17/17 also complaining of RUE swelling and neck pain. He underwent further work up which has identified cellulitis of his graft site. He has started IV abx. His work up with CT imaging revealed a spiculated mass in the RUL measuring about 16 cm and what appears to be adenopathy in the mediastinum and right neck. His tumor is compressing the SVC and he has undergone biopsy of a neck node and this was consistent with a poorly differentiated carcinoma consisten with adenocarcinoma of either lung or GI malignancy. We're asked to see the patient due to his SVC syndrome.   PREVIOUS RADIATION THERAPY: No  PAST MEDICAL HISTORY:  Past Medical History:  Diagnosis Date  . Anxiety   . Arterial occlusive disease    multilevel arterial  occlusive disease/notes 11/13/2017  . Arthritis    "left knee" (11/13/2017)  . Chronic back pain   . Chronic lower back pain   . Depression   . Gastric ulcer due to Helicobacter pylori 9767  . GERD (gastroesophageal reflux disease)   . High cholesterol   . Hypertension   . IBS (irritable bowel syndrome)   . Mesenteric adenitis 2011   presumed/H&P  . Migraines    "use to suffer migraines years ago" (11/13/2017)  . Peripheral vascular disease ([place])   . Pneumonia 2011  . Ulcer       PAST SURGICAL HISTORY: Past Surgical History:  Procedure Laterality Date  . AORTA - BILATERAL FEMORAL ARTERY BYPASS GRAFT Bilateral 11/15/2017   Procedure: AORTA BIFEMORAL BYPASS GRAFT;  Surgeon: Angelia Mould, MD;  Location: [place];  Service: Vascular;  Laterality: Bilateral;  . BACK SURGERY  X 4   "procedure where they burn the nerves every 6 months"  . EMBOLECTOMY Left 12/01/2017   Procedure: THROMBECTOMY OF LEFT AORTAFEMORAL BYPASS, THROMBECTOMY OF TIBIAL ARTERY;  Surgeon: Rosetta Posner, MD;  Location: [facility] OR;  Service: Vascular;  Laterality: Left;  . ESOPHAGOGASTRODUODENOSCOPY  08/22/2011   Procedure: ESOPHAGOGASTRODUODENOSCOPY (EGD);  Surgeon: [name], MD;  Location: [place];  Service: Gastroenterology;  Laterality: N/A;  . FEMORAL-POPLITEAL BYPASS GRAFT Right 11/15/2017   Procedure: Right FEMORAL-Above knee POPLITEAL ARTERY Bypass Graft  using nonreversed saphenous vein ;  Surgeon: Angelia Mould, MD;  Location: [place];  Service: Vascular;  Laterality: Right;  . FEMORAL-POPLITEAL BYPASS GRAFT Left 12/01/2017   Procedure: BYPASS GRAFT FEMORAL- ABOVE KNEE POPLITEAL ARTERY WITH VEIN;  Surgeon: Rosetta Posner, MD;  Location: [place];  Service: Vascular;  Laterality: Left;  . INTRAOPERATIVE ARTERIOGRAM Right 11/15/2017   Procedure: INTRA OPERATIVE ARTERIOGRAM;  Surgeon: Angelia Mould, MD;  Location: [place];  Service: Vascular;  Laterality: Right;  . INTRAOPERATIVE ARTERIOGRAM Left  12/01/2017   Procedure: INTRA OPERATIVE ARTERIOGRAM OF LEFT LEG;  Surgeon: Rosetta Posner, MD;  Location: [place];  Service: Vascular;  Laterality: Left;  . IR US GUIDE BX ASP/DRAIN  12/18/2017  . KNEE ARTHROSCOPY Left X 5    FAMILY HISTORY:  Family History  Problem Relation Age of Onset  . Hypertension Mother   . Hypertension Father   . Coronary artery disease Father 33  . Heart attack Brother 78  . Coronary artery disease Brother   . Diabetes Neg Hx   . Stroke Neg Hx     SOCIAL HISTORY:  Social History   Socioeconomic History  . Marital status: Married    Spouse name: Not on file  . Number of children: 3  . Years of education: Not on file  . Highest education level: Not on file  Occupational History  . Occupation: Admin. Assistant    Employer: [employer]  Social Needs  . Financial resource strain: Not on file  . Food insecurity:    Worry: Not on file    Inability: Not on file  . Transportation needs:    Medical: Not on file    Non-medical: Not on file  Tobacco Use  . Smoking status: Former Smoker    Packs/day: 1.00    Years: 24.00    Pack years: 24.00    Types: Cigarettes    Start date: 11/28/2017    Last attempt to quit: 12/04/2017    Years since quitting: 0.0  . Smokeless tobacco: Never Used  Substance and Sexual Activity  . Alcohol use: Yes    Comment: 11/13/2017 "did drink wine q  1-2 months; nothing anymore"   . Drug use: Not Currently    Types: Marijuana    Comment: 11/13/2017  "last drug use was in ~ 1987 "  . Sexual activity: Not on file  Lifestyle  . Physical activity:    Days per week: Not on file    Minutes per session: Not on file  . Stress: Not on file  Relationships  . Social connections:    Talks on phone: Not on file    Gets together: Not on file    Attends religious service: Not on file    Active member of club or organization: Not on file    Attends meetings of clubs or organizations: Not on file    Relationship status: Not on file  .  Intimate partner violence:    Fear of current or ex partner: Not on file    Emotionally abused: Not on file    Physically abused: Not on file    Forced sexual activity: Not on file  Other Topics Concern  . Not on file  Social History Narrative   Lives with wife and two children.  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  0.9 %  sodium chloride infusion   Intravenous PRN Emokpae, Courage, MD      . 0.9 %  sodium chloride infusion  250 mL Intravenous PRN Emokpae, Courage, MD      . acetaminophen (TYLENOL) tablet 1,000 mg  1,000 mg Oral TID Pershing Proud, NP   2,355 mg at 12/31/17 0912  . alum & mag hydroxide-simeth (MAALOX/MYLANTA) 200-200-20 MG/5ML suspension 15-30 mL  15-30 mL Oral Q2H PRN Samuella Cota, MD      . atorvastatin (LIPITOR) tablet 40 mg  40 mg Oral Daily Samuella Cota, MD   40 mg at 12/31/17 0912  . bisacodyl (DULCOLAX) suppository 10 mg  10 mg Rectal Daily PRN Samuella Cota, MD   10 mg at 12/21/17 0803  . dexamethasone (DECADRON) injection 4 mg  4 mg Intravenous TID [name], MD   4 mg at 12/31/17 0911  . dronabinol (MARINOL) capsule 2.5 mg  2.5 mg Oral BID AC Samuella Cota, MD   2.5 mg at 12/30/17 1826  . DULoxetine (CYMBALTA) DR capsule 20 mg  20 mg Oral Daily Bullard, Elray Mcgregor, NP   20 mg at 12/31/17 0911  . escitalopram (LEXAPRO) tablet 10 mg  10 mg Oral Daily Emokpae, Courage, MD   10 mg at 12/31/17 0912  . feeding supplement (BOOST / RESOURCE BREEZE) liquid 1 Container  1 Container Oral TID BM Pershing Proud, NP   1 Container at 12/31/17 0913  . fentaNYL (DURAGESIC - dosed mcg/hr) patch 100 mcg  100 mcg Transdermal Q72H Bullard, Elray Mcgregor, NP   100 mcg at 12/29/17 0924  . fondaparinux (ARIXTRA) injection 7.5 mg  7.5 mg Subcutaneous q1800 Samuella Cota, MD   7.5 mg at 12/30/17 1826  . gabapentin (NEURONTIN) capsule 400 mg  400 mg Oral TID Roxan Hockey, MD   400 mg at 12/31/17 0911  . guaiFENesin-dextromethorphan (ROBITUSSIN DM) 100-10 MG/5ML syrup 15 mL  15 mL Oral Q4H PRN Samuella Cota, MD      . hydrALAZINE (APRESOLINE) injection 10 mg  10 mg Intravenous Q4H PRN Emokpae, Courage, MD      . HYDROmorphone (DILAUDID) injection 2 mg  2 mg Intravenous Q2H PRN Nita Sells, MD   2 mg at 12/31/17 1024  . HYDROmorphone (DILAUDID) tablet 10 mg  10 mg Oral Q3H PRN Nita Sells, MD   10 mg at 12/31/17 0911  . labetalol (NORMODYNE,TRANDATE) injection 10 mg  10 mg Intravenous Q10 min PRN Samuella Cota, MD   10 mg at 12/25/17 1125  . lidocaine (LIDODERM) 5 % 1 patch  1 patch Transdermal D32K Pershing Proud, NP   1 patch at 12/30/17 2211  . LORazepam (ATIVAN) tablet 1 mg  1 mg Oral Q8H PRN Samuella Cota, MD   1 mg at 12/30/17 1104  . LORazepam (ATIVAN) tablet 1 mg  1 mg Oral TID Bullard, Elray Mcgregor, NP   1 mg at 12/31/17 0912  . mirtazapine (REMERON) tablet 15 mg  15 mg Oral QHS Emokpae, Courage, MD   15 mg at 12/30/17 2205  . naloxone [facility]) injection 0.4 mg  0.4 mg Intravenous PRN Bullard, Elray Mcgregor, NP      . ondansetron [facility]) tablet 4 mg  4 mg Oral Q6H Samuella Cota, MD   4 mg at 12/31/17 1024  . pantoprazole (PROTONIX) EC tablet 40 mg  40 mg Oral Daily Samuella Cota, MD   40 mg at 12/31/17 0912  .  phenol (CHLORASEPTIC) mouth spray 1 spray  1 spray Mouth/Throat PRN Samuella Cota, MD      . polyethylene glycol (MIRALAX / GLYCOLAX) packet 17 g  17 g Oral Daily PRN Samuella Cota, MD   17 g at 12/22/17 1436  . promethazine (PHENERGAN) tablet 12.5 mg  12.5 mg Oral Q8H PRN Samuella Cota, MD      . senna [facility]) tablet 8.6 mg  1 tablet Oral QHS Pershing Proud, NP   8.6 mg at 12/30/17 2205  . sodium chloride flush (NS) 0.9 % injection 3 mL  3 mL Intravenous Q12H Samuella Cota,  MD   3 mL at 12/31/17 0912  . sodium chloride flush (NS) 0.9 % injection 3 mL  3 mL Intravenous PRN Samuella Cota, MD        REVIEW OF SYSTEMS:  On review of systems, the patient reports that he is doing okay. He's been shocked by his new diagnosis. He is ready though to move forward with treatment. He describes shortness of breath with talking at times and loosing his voice sometimes due to shortness of breath at these times. He denies any chest pain or hemoptysis. He does admit to fullness and increased effort to breathe. He describes soreness and fullness along his right neck. He denies any bowel or bladder disturbances, and denies abdominal pain, nausea or vomiting. He denies any other musculoskeletal or joint aches or pains. A complete review of systems is obtained and is otherwise negative.    PHYSICAL EXAM:  Wt Readings from Last 3 Encounters:  12/18/17 157 lb 13.6 oz (71.6 kg)  12/04/17 179 lb 14.3 oz (81.6 kg)  11/28/17 180 lb (81.6 kg)   Temp Readings from Last 3 Encounters:  12/31/17 97.7 F (36.5 C) (Oral)  12/05/17 99.4 F (37.4 C) (Oral)  11/28/17 99.1 F (37.3 C) (Oral)   BP Readings from Last 3 Encounters:  12/31/17 (!) 144/79  12/05/17 (!) 152/83  11/28/17 (!) 169/100   Pulse Readings from Last 3 Encounters:  12/31/17 77  12/05/17 (!) 109  11/28/17 78   Pain Assessment Pain Score: 8  Pain Frequency: Intermittent/10  In general this is a chronically tired appearing young African American male in no acute distress. He is alert and oriented x4 and appropriate throughout the examination. HEENT reveals that the patient has notable edema of the right neck and erythema along this as well. He does not have any significant facial edema on the left, and mild edema on the right. EOMs are intact. Skin is intact. Cardiopulmonary assessment is negative for acute distress and he exhibits normal effort. Lymphatic assessment reveals fullness, erythema, and reproducible pain  along the cervical and supraclavicular chains.   KPS = 50  100 - Normal; no complaints; no evidence of disease. 90   - Able to carry on normal activity; minor signs or symptoms of disease. 80   - Normal activity with effort; some signs or symptoms of disease. 63   - Cares for self; unable to carry on normal activity or to do active work. 60   - Requires occasional assistance, but is able to care for most of his personal needs. 50   - Requires considerable assistance and frequent medical care. 36   - Disabled; requires special care and assistance. 36   - Severely disabled; hospital admission is indicated although death not imminent. 64   - Very sick; hospital admission necessary; active supportive treatment necessary. 10   -  Moribund; fatal processes progressing rapidly. 0     - Dead  Karnofsky DA, Abelmann Tignall, Craver LS and Burchenal [facility] [phone]) The use of the nitrogen mustards in the palliative treatment of carcinoma: with particular reference to bronchogenic carcinoma Cancer 1 634-56  LABORATORY DATA:  Lab Results  Component Value Date   WBC 9.4 12/29/2017   HGB 7.8 (L) 12/29/2017   HCT 25.5 (L) 12/29/2017   MCV 94.4 12/29/2017   PLT 435 (H) 12/29/2017   Lab Results  Component Value Date   NA 143 12/29/2017   K 3.6 12/29/2017   CL 108 12/29/2017   CO2 25 12/29/2017   Lab Results  Component Value Date   ALT 14 12/29/2017   AST 19 12/29/2017   ALKPHOS 82 12/29/2017   BILITOT 0.5 12/29/2017     RADIOGRAPHY: Ct Soft Tissue Neck W Contrast  Result Date: 12/17/2017 CLINICAL DATA:  52 y/o M; recently removed right neck PICC line with swelling and pain in the region of PICC line. EXAM: CT NECK WITH CONTRAST TECHNIQUE: Multidetector CT imaging of the neck was performed using the standard protocol following the bolus administration of intravenous contrast. CONTRAST:  100 cc Omnipaque 300 COMPARISON:  None. FINDINGS: Pharynx and larynx: Normal. No mass or swelling. Salivary glands: No  inflammation, mass, or stone. Thyroid: Normal. Lymph nodes: Right cervical, bilateral supraclavicular, and upper mediastinal bulky lymphadenopathy. A right upper paratracheal lymph node measures 5.0 x 3.5 cm (series 3, image 109), left supraclavicular node measures 3.1 x 1.7 cm (series 3, image 85), and a right supraclavicular node measures 1.6 x 2.1 cm (series 3, image 68). Vascular: Deep venous thrombosis involving right subclavian vein, right brachiocephalic vein, and right internal jugular vein. Limited intracranial: Negative. Visualized orbits: Negative. Mastoids and visualized paranasal sinuses: Small right maxillary sinus mucous retention cyst. Skeleton: No acute or aggressive process. Upper chest: Spiculated nodule in the right upper lobe measuring 16 x 12 mm (series 3, image 115). Other: Mild edema in the right supraclavicular fossa and lower neck. IMPRESSION: 1. Deep venous thrombosis involving the right subclavian vein, right brachiocephalic vein, and right internal jugular vein. 2. Edema in the right lower neck and supraclavicular fossa is probably related to venous congestion from DVT and/or phlebitis. No abscess. 3. Severe bulky lymphadenopathy of right cervical, bilateral supraclavicular, and upper mediastinal lymph nodes likely representing metastatic disease or lymphoproliferative disease. 4. Spiculated right upper lobe nodule measuring up to 16 mm. Consider one of the following in 3 months for both low-risk and high-risk individuals: (a) repeat chest CT, (b) follow-up PET-CT, or (c) tissue sampling. This recommendation follows the consensus statement: Guidelines for Management of Incidental Pulmonary Nodules Detected on CT Images: From the Fleischner Society 2017; Radiology 2017; 284:228-243. These results will be called to the ordering clinician or representative by the Radiologist Assistant, and communication documented in the PACS or zVision Dashboard. Electronically Signed   By: Kristine Garbe M.D.   On: 12/17/2017 15:04   Ct Chest W Contrast  Result Date: 12/25/2017 CLINICAL DATA:  Spiculated lung mass and adenopathy shown on neck CT, for further workup. EXAM: CT CHEST, ABDOMEN, AND PELVIS WITH CONTRAST TECHNIQUE: Multidetector CT imaging of the chest, abdomen and pelvis was performed following the standard protocol during bolus administration of intravenous contrast. CONTRAST:  193mL OMNIPAQUE IOHEXOL 300 MG/ML  SOLN COMPARISON:  12/17/2017 and CT abdomen from 08/14/2011 there is some mild fatty infiltration in segment IV adjacent to the falciform ligament. Otherwise unremarkable. FINDINGS: CT  CHEST FINDINGS Cardiovascular: Aortic arch and branch vessel atherosclerotic vascular disease. Poor visualization of contrast in the right internal jugular vein, compatible with patient's known venous thrombosis. Mediastinum/Nodes: Bilateral lower neck level IV adenopathy with ill definition. A left level Roman numeral 4 lymph node measures 2.0 cm in short axis on image 2/3. There is also bilateral supraclavicular adenopathy including a 1.9 cm right-sided supraclavicular node on image 7/3. Bulky paratracheal, prevascular, subcarinal, and right hilar adenopathy noted. An index right paratracheal node measures 3.6 cm in short axis on image 16/3. Enlarged right subpectoral lymph nodes are present along with scattered right axillary lymph nodes. Mild stranding in the anterior mediastinum. Lungs/Pleura: Moderate right and small left pleural effusion are present and are nonspecific for transudative versus exudative etiology. A spiculated right upper lobe nodule measures 2.4 by 1.3 by 1.4 cm, roughly stable from 12/17/2017. There is passive atelectasis in both lower lobes, right greater than left. Paraseptal emphysema noted. Musculoskeletal: Notable subcutaneous edema throughout the chest, somewhat confluent along the right upper chest. CT ABDOMEN PELVIS FINDINGS Hepatobiliary: Unremarkable  Pancreas: Unremarkable Spleen: Unremarkable Adrenals/Urinary Tract: Unremarkable Stomach/Bowel: Air-fluid levels in the rectum indicating diarrheal process. Borderline wall thickening several segments of proximal small bowel. Vascular/Lymphatic: Aortoiliac atherosclerotic vascular disease. Aortobifemoral graft small fluid collection superficial to the right groin likely representing residual hematoma from prior groin puncture, but only measuring about 2.4 by 2.1 by 4.3 cm (volume = 11 cm^3). No gas in this collection. Left inguinal lymph node 0.9 cm in short axis. Reproductive: Unremarkable Other: Diffuse subcutaneous edema. Small amount of pelvic ascites. Gas tracking along the anterior abdominal wall subcutaneous tissues, correlate with any anterior abdominal wall injections. Subcutaneous edema extends along the pubis and towards the penis. Musculoskeletal: Unremarkable IMPRESSION: 1. Bulky adenopathy in the neck and upper chest. Persistent 2.4 cm spiculated right upper lobe nodule. The degree of adenopathy seems disproportionate to the size of the nodule, and this could represent adenopathy related to lung cancer or potentially 2 separate processes such as lymphoproliferative disease and a separate primary lung cancer. An infectious cause for the pulmonary nodule is less likely. 2. Extensive third spacing of fluid in the chest, abdomen and pelvis. Query hypoproteinemia or hypoalbuminemia. 3. Poor visualization of contrast in the right internal jugular vein compatible with the patient's known venous thrombosis. 4. Air fluid levels in the rectum indicating diarrheal process. Borderline wall thickening in the proximal small bowel, query proximal enteritis. 5. The small fluid collection superficial to the right groin puncture site is only about 11 cc in volume, and probably reflects a resolving hematoma. 6. Gas tracking along anterior abdominal wall subcutaneous tissues, probably from injections-correlate with any  anterior abdominal wall subcutaneous injections recently. Electronically Signed   By: Van Clines M.D.   On: 12/25/2017 00:05   Mr Jeri Cos DD Contrast  Result Date: 12/25/2017 CLINICAL DATA:  52 year old male with right upper extremity and right central venous DVT with bulky cervical and thoracic lymphadenopathy and spiculated right apical lung nodule. EXAM: MRI HEAD WITHOUT AND WITH CONTRAST TECHNIQUE: Multiplanar, multiecho pulse sequences of the brain and surrounding structures were obtained without and with intravenous contrast. CONTRAST:  26mL MULTIHANCE GADOBENATE DIMEGLUMINE 529 MG/ML IV SOLN COMPARISON:  Neck CT 12/17/2017. Head CT without contrast 08/03/2006. Neck and chest CT FINDINGS: Brain: No midline shift, mass effect, or evidence of intracranial mass lesion. No abnormal enhancement identified. No dural thickening. No restricted diffusion to suggest acute infarction. No ventriculomegaly, extra-axial collection or acute intracranial hemorrhage. Cervicomedullary  junction and pituitary are within normal limits. [place] Cables and white matter signal is within normal limits for age throughout the brain; Minimal nonspecific left frontal lobe white matter T2 and FLAIR hyperintensity (series 6, image 19). No cortical encephalomalacia or chronic cerebral blood products. Vascular: Major intracranial vascular flow voids are preserved. The major dural venous sinuses are enhancing and appear patent. The right IJ bulb at the skull base is patent. Skull and upper cervical spine: Negative visible cervical spine and spinal cord. Visualized bone marrow signal is within normal limits. Sinuses/Orbits: Negative orbits soft tissues. Paranasal sinuses and mastoids are stable and well pneumatized. Other: Visible internal auditory structures appear normal. Partially visible bulky right neck lymphadenopathy (series 4, image 14). IMPRESSION: 1. No cerebral metastatic disease or acute intracranial abnormality identified. 2.  Right cervical lymphadenopathy redemonstrated. Electronically Signed   By: Genevie Ann M.D.   On: 12/25/2017 09:17   Ct Abdomen Pelvis W Contrast  Result Date: 12/25/2017 CLINICAL DATA:  Spiculated lung mass and adenopathy shown on neck CT, for further workup. EXAM: CT CHEST, ABDOMEN, AND PELVIS WITH CONTRAST TECHNIQUE: Multidetector CT imaging of the chest, abdomen and pelvis was performed following the standard protocol during bolus administration of intravenous contrast. CONTRAST:  15mL OMNIPAQUE IOHEXOL 300 MG/ML  SOLN COMPARISON:  12/17/2017 and CT abdomen from 08/14/2011 there is some mild fatty infiltration in segment IV adjacent to the falciform ligament. Otherwise unremarkable. FINDINGS: CT CHEST FINDINGS Cardiovascular: Aortic arch and branch vessel atherosclerotic vascular disease. Poor visualization of contrast in the right internal jugular vein, compatible with patient's known venous thrombosis. Mediastinum/Nodes: Bilateral lower neck level IV adenopathy with ill definition. A left level Roman numeral 4 lymph node measures 2.0 cm in short axis on image 2/3. There is also bilateral supraclavicular adenopathy including a 1.9 cm right-sided supraclavicular node on image 7/3. Bulky paratracheal, prevascular, subcarinal, and right hilar adenopathy noted. An index right paratracheal node measures 3.6 cm in short axis on image 16/3. Enlarged right subpectoral lymph nodes are present along with scattered right axillary lymph nodes. Mild stranding in the anterior mediastinum. Lungs/Pleura: Moderate right and small left pleural effusion are present and are nonspecific for transudative versus exudative etiology. A spiculated right upper lobe nodule measures 2.4 by 1.3 by 1.4 cm, roughly stable from 12/17/2017. There is passive atelectasis in both lower lobes, right greater than left. Paraseptal emphysema noted. Musculoskeletal: Notable subcutaneous edema throughout the chest, somewhat confluent along the right  upper chest. CT ABDOMEN PELVIS FINDINGS Hepatobiliary: Unremarkable Pancreas: Unremarkable Spleen: Unremarkable Adrenals/Urinary Tract: Unremarkable Stomach/Bowel: Air-fluid levels in the rectum indicating diarrheal process. Borderline wall thickening several segments of proximal small bowel. Vascular/Lymphatic: Aortoiliac atherosclerotic vascular disease. Aortobifemoral graft small fluid collection superficial to the right groin likely representing residual hematoma from prior groin puncture, but only measuring about 2.4 by 2.1 by 4.3 cm (volume = 11 cm^3). No gas in this collection. Left inguinal lymph node 0.9 cm in short axis. Reproductive: Unremarkable Other: Diffuse subcutaneous edema. Small amount of pelvic ascites. Gas tracking along the anterior abdominal wall subcutaneous tissues, correlate with any anterior abdominal wall injections. Subcutaneous edema extends along the pubis and towards the penis. Musculoskeletal: Unremarkable IMPRESSION: 1. Bulky adenopathy in the neck and upper chest. Persistent 2.4 cm spiculated right upper lobe nodule. The degree of adenopathy seems disproportionate to the size of the nodule, and this could represent adenopathy related to lung cancer or potentially 2 separate processes such as lymphoproliferative disease and a separate primary lung cancer. An infectious  cause for the pulmonary nodule is less likely. 2. Extensive third spacing of fluid in the chest, abdomen and pelvis. Query hypoproteinemia or hypoalbuminemia. 3. Poor visualization of contrast in the right internal jugular vein compatible with the patient's known venous thrombosis. 4. Air fluid levels in the rectum indicating diarrheal process. Borderline wall thickening in the proximal small bowel, query proximal enteritis. 5. The small fluid collection superficial to the right groin puncture site is only about 11 cc in volume, and probably reflects a resolving hematoma. 6. Gas tracking along anterior abdominal wall  subcutaneous tissues, probably from injections-correlate with any anterior abdominal wall subcutaneous injections recently. Electronically Signed   By: Van Clines M.D.   On: 12/25/2017 00:05   Ir US Guide Bx Asp/drain  Result Date: 12/18/2017 INDICATION: 52 year old male with a history of neck mass EXAM: IR ULTRASOUND GUIDANCE MEDICATIONS: None. ANESTHESIA/SEDATION: Moderate (conscious) sedation was employed during this procedure. A total of Versed 2.0 mg and Fentanyl 100 mcg was administered intravenously. Moderate Sedation Time: 12 minutes. The patient's level of consciousness and vital signs were monitored continuously by radiology nursing throughout the procedure under my direct supervision. FLUOROSCOPY TIME:  None COMPLICATIONS: None PROCEDURE: Informed written consent was obtained from the patient after a thorough discussion of the procedural risks, benefits and alternatives. All questions were addressed. Maximal Sterile Barrier Technique was utilized including caps, mask, sterile gowns, sterile gloves, sterile drape, hand hygiene and skin antiseptic. A timeout was performed prior to the initiation of the procedure. Patient positioned supine position on the ultrasound table. Ultrasound images of the right neck were performed with images stored sent to PACs. Patient is then prepped and draped in the usual sterile fashion. 1% lidocaine was used for local anesthesia. Using ultrasound guidance, multiple 18 gauge core biopsy performed of right neck mass. Tissue specimen placed in the saline. Sterile bandage was placed. Patient tolerated the procedure well and remained hemodynamically stable throughout. No complications were encountered and no significant blood loss. IMPRESSION: Status post ultrasound-guided biopsy of right neck mass. Signed, Dulcy Fanny. Dellia Nims, RPVI [facility] Radiology Electronically Signed   By: Corrie Mckusick D.O.   On: 12/18/2017  17:00   Dg Duanne Limerick W/small Bowel  Result Date: 12/28/2017 CLINICAL DATA:  Metastatic adenocarcinoma of unknown primary. EXAM: UPPER GI SERIES WITH SMALL BOWEL FOLLOW-THROUGH FLUOROSCOPY TIME:  Fluoroscopy Time:  3 minutes 6 seconds TECHNIQUE: Combined double contrast and single contrast upper GI series using effervescent crystals, thick barium, and thin barium. Subsequently, serial images of the small bowel were obtained including spot views of the terminal ileum. COMPARISON:  CT scan dated 12/24/2017 FINDINGS: The esophagus appears normal. The fundus, body, and antrum of the stomach are normal. Pylorus is normal. There is slight edema of the mucosa of the duodenal bulb but the bulb expands and contracts without a mass. The jejunum and ileum appear normal including the terminal ileum. Small bowel transit time was 50 minutes, normal. IMPRESSION: Slight edema of the mucosa of the duodenal bulb. Otherwise normal upper GI and small-bowel follow-through. No visible mass lesions in the stomach or small bowel. Electronically Signed   By: Lorriane Shire M.D.   On: 12/28/2017 09:47      IMPRESSION/PLAN: 1. 52 y.o. 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 Dr. Tammi Klippel has reveiwed the patient's case over the weekend. He has recommended urgent radiotherapy given his SVC syndrome. I spent time discussing the pathology findings  and reviews the nature of locally advanced lung cancer. We discussed the risks, benefits, short, and long term effects of radiotherapy, and the patient is interested in proceeding. Dr. Tammi Klippel has recommended a course of 33 fractions, and I reviewed the logistics of radiotherapy and we anticipate starting today. Written consent is obtained and placed in the chart, a copy was provided to the patient. 2. Extensive DVT in the right subclavian, brachiocephalic, and right interal jucular veins. The patient remains on anticoagulation with the primary  team.  3. PVD with recent bipass graft and cellulitis. We will follow along with this ex[ectantly while he receives antibiotics.    In a visit lasting 70 minutes, greater than 50% of the time was spent face to face discussing his case and in floor time, coordinating the patient's care.    Carola Rhine, [facility]   Page Me

## 2017-12-31 NOTE — Progress Notes (Signed)
Daily Progress Note   Patient Name: Tony [name]       Date: 01/01/2018 DOB: [birth year]  Age: 52 y.o. MRN#: [id number] Attending Physician: Nita Sells, MD Primary Care Physician: [name], MD Admit Date: 12/17/2017  Reason for Consultation/Follow-up: Pain control and Psychosocial/spiritual support  Length of Stay: 15  Current Medications: Scheduled Meds:  . acetaminophen  1,000 mg Oral TID  . atorvastatin  40 mg Oral Daily  . dexamethasone  4 mg Intravenous TID  . dronabinol  2.5 mg Oral BID AC  . DULoxetine  20 mg Oral Daily  . escitalopram  10 mg Oral Daily  . feeding supplement  1 Container Oral TID BM  . fentaNYL  100 mcg Transdermal Q72H  . fondaparinux (ARIXTRA) injection  7.5 mg Subcutaneous q1800  . gabapentin  400 mg Oral TID  . lidocaine  1 patch Transdermal Q24H  . LORazepam  1 mg Oral TID  . metoprolol succinate  100 mg Oral QHS  . mirtazapine  15 mg Oral QHS  . nutrition supplement (JUVEN)  1 packet Oral BID BM  . ondansetron  4 mg Oral Q6H  . pantoprazole  40 mg Oral Daily  . senna  1 tablet Oral QHS  . sodium chloride flush  3 mL Intravenous Q12H    Continuous Infusions: . sodium chloride    . sodium chloride      PRN Meds: sodium chloride, sodium chloride, alum & mag hydroxide-simeth, bisacodyl, guaiFENesin-dextromethorphan, hydrALAZINE, HYDROmorphone (DILAUDID) injection, HYDROmorphone, labetalol, LORazepam, naLOXone (NARCAN)  injection, phenol, polyethylene glycol, promethazine, sodium chloride flush  Physical Exam  Constitutional: Appears depressed, sitting by the window, but does make eye contact Neck:  Decreased range of motion to neck- swollen and red right side of neck distended Cardiovascular: Normal rate.  Pulmonary/Chest:  Effort normal.  Abdominal: Soft.  Musculoskeletal:  Toes to right foot black   Neurological: He is alert and oriented to person, place, and time.  Skin: Skin is warm and dry.  Skin discoloration to the right side of the neck down upper right chest wall  Psychiatric:  Flat affect. No psychosis, no SI no HI  Nursing note and vitals reviewed.           Vital Signs: BP (!) 154/74 (BP Location: Left Arm)   Pulse 74  Temp 97.8 F (36.6 C) (Oral)   Resp 18   Ht 6\' 5"  (1.956 m)   Wt 86.1 kg (189 lb 12.8 oz)   SpO2 96%   BMI 22.51 kg/m  SpO2: SpO2: 96 % O2 Device: O2 Device: Room Air O2 Flow Rate: O2 Flow Rate (L/min): 2 L/min  Intake/output summary:   Intake/Output Summary (Last 24 hours) at 01/01/2018 1813 Last data filed at 01/01/2018 1730 Gross per 24 hour  Intake 600 ml  Output 350 ml  Net 250 ml   LBM: Last BM Date: 12/31/17 Baseline Weight: Weight: 71.6 kg (157 lb 13.6 oz) Most recent weight: Weight: 86.1 kg (189 lb 12.8 oz)       Palliative Assessment/Data: 60%    Flowsheet Rows     Most Recent Value  Intake Tab  Referral Department  Surgery  Palliative Care Primary Diagnosis  Cancer  Date Notified  12/25/17  Palliative Care Type  New Palliative care  Reason for referral  Clarify Goals of Care, Non-pain Symptom  Date of Admission  12/17/17  Date first seen by Palliative Care  12/26/17  # of days Palliative referral response time  1 Day(s)  # of days IP prior to Palliative referral  8  Clinical Assessment  Psychosocial & Spiritual Assessment  Palliative Care Outcomes      Patient Active Problem List   Diagnosis Date Noted  . Counseling regarding advance care planning and goals of care 01/01/2018  . Adenocarcinoma ([place])   . SVC syndrome 12/31/2017  . Adenocarcinoma of left lung, stage 4 ([place]) 12/31/2017  . Mass in neck   . Neck pain due to malignant neoplastic disease ([place])   . Palliative care by specialist   . Uncontrolled pain   . Palliative care  encounter   . Malignant neoplasm of upper lobe of right lung ([place]) 12/25/2017  . Gangrene of right foot ([place]) 12/20/2017  . AKI (acute kidney injury) ([place]) 12/20/2017  . PAD (peripheral artery disease) ([place]) 12/19/2017  . S/P aortobifemoral bypass surgery 12/17/2017  . Lung mass 12/17/2017  . Postoperative wound infection 12/17/2017  . Weight loss 12/17/2017  . Cervical lymphadenopathy 12/17/2017  . DVT (deep venous thrombosis) ([place]) 12/17/2017  . Aortoiliac occlusive disease ([place]) 11/15/2017  . Pain of right lower extremity due to ischemia 11/13/2017  . Hyperlipidemia LDL goal <70 11/13/2017  . PVD (peripheral vascular disease) ([place]) 11/11/2017  . H. pylori infection 10/11/2011  . Duodenal ulcer 08/23/2011  . IBS (irritable bowel syndrome) 08/18/2011  . Depression with anxiety 05/11/2009  . EPIGASTRIC PAIN, CHRONIC 05/11/2009  . SMOKER 09/01/2008  . INSOMNIA, CHRONIC 09/01/2008  . Essential hypertension 09/01/2008  . PALPITATIONS, CHRONIC 09/01/2008  . GASTRITIS 10/24/2007    Palliative Care Assessment & Plan   Patient Profile: 52 y.o.malewith past medical history of HTN, HLD, arterial occlusive disease, PVD, IBS, chronic back pain, anxiety, depressionadmitted on 7/8/2019with increased drainage from incision sites and with critical limb ischemia of left lower extremity.S/P Right Aortobifemoral bypass and fem-pop bypass 6/6 and found to thrombectomy with fem-pop bypass on left 6/22. Hospitalization complicated by uncontrolled pain likely s/t newly diagnosed metastatic adenocarcinoma with significant lymphadenopathy to neck. Also with venous thrombus of right subclavian, brachiocephalic, and IJ veins.  Palliative Consult ordered for assistance with pain management.  Patient has been started on fentanyl transdermal but continues on IV Dilaudid as well as oral Dilaudid for breakthrough.  Assessment:  I spent over an hour with Cindee Salt talking about his condition, his worries  and  concerns. He has requested a shorter interval for IV dilaudid and says it is the only thing that helps his pain- he sees minimal difference after starting the transdermal fentanyl. His pain is clearly worsened by his anxiety-existential suffering and he is actively grieving his diagnosis of lung cancer and also dealing with devastating PVD, SVC syndrome and has now completed radiation tx #2. His wife was present for much of the conversation. He is embracing a "fight" approach and seems to appreciate support.   His pain is multifactorial - he says is mostly located deep in his right shoulder and neck- he says it is unrelenting-worse with certain movements. His right arm is swollen and numb feeling but no lancing pains that move into his arm. He is guarding motion of his upper extremity. Currently we are treating his pain with Opioid titration, Scheduled Tylenol, High dose decadron, radiation tx, gabapentin, cymbalta.  Recommendations:  Spiritual Care consult- high levels of existential suffering and grief. Decrease interval of IV hydromorphone- he refuses PCA- will adjust TD fentanyl tomorrow.      Greater than 50%  of this time was spent counseling and coordinating care related to the above assessment and plan.  [name], DO  Please contact Palliative Medicine Team phone at [phone] for questions and concerns.

## 2017-12-31 NOTE — Progress Notes (Addendum)
[facility] PROGRESS NOTE  KAIRE STARY QQV:[id number] DOB: Jun 12, 1966 DOA: 12/17/2017 PCP: [name], MD  Narrative: 52 year old male Significant vascular history status post aortobifemoral bypass-right FEM- pop-thrombectomy LLE E AV F bypass-all managed by vascular surgery-knee incision with postop wound infection and cellulitis LLE Found to have neck pain with spiculated lung mass concerning for malignancy-patient was transferred under the care of the hospitalist service after discussion with Dr. Tammi Klippel of radiation oncology for consideration of pain management SVC syndrome   A & Plan Poorly differentiated adenocarcinoma probable lung-D/W Dr. Maryfrances Bunnell peripherally aware of the same and will be arranging XRT-we will continue the current pain regimen which I have augmented today I have explained to him that XRT will help with some of the pain but will not take away all of the pain at this time and it will take some time to figure out the next steps with oncology's guidance regarding possible chemotherapy  Left groin wound postop infection-completed Augmentin-vascular surgery following peripherally  Venous thrombosis right subclavian veins-continue Arixtra as per oncology  Malignancy rated pain-steroids, fentanyl patch, gabapentin  With right foot gangrene-no aggressive approach await autoamputation  Anxiety/bipolar-Remeron, Marinol, lorazepam, Lexapro-considering Cymbalta  Impaired glycemic control-sugars are not above 180-A1c is probably less than 6.2-monitor  Gangrene of right foot-status post intervention awaiting autoamputation-wounds currently looks stable  Probable malignancy mediated anemia versus blood loss anemia-monitor trends-transfuse if less than hemoglobin of 7   -Unclear at this time-needs multiple fractions of radiotherapy and will probably need discussion in the near future regarding outpatient initiation of chemo  DVT prophylaxis: Lovenox  code Status: Full code family Communication: Long discussion with son at the bedside disposition Plan: Inpatient   Verlon Au, MD  [facility] Direct contact: [phone] --Via amion app OR  --[email or web address]; password [email or web address]  7PM-7AM contact night coverage as above 12/31/2017, 7:57 AM  LOS: 14 days   Consults  : Oncology/vascular surgery/palliative care/ interventional  radiology  Procedures:  7/9 US guided right neck mass biopsy. Mx 18g core biopsy.  Antimicrobials:  Unaysn 7/11 >> 7/13  Augmentin 7/13 >>  Zosyn 7/8 >> 7/10  Vancomycin 7/8 >> 7/10    Interval history/Subjective: States that pain medications are not helping significantly and asking for another dose Otherwise has been ambulatory to the bedside without too much distress  Objective:  Vitals:  Vitals:   12/30/17 1914 12/31/17 0455  BP: 139/83 (!) 144/79  Pulse: (!) 113 77  Resp: 18 18  Temp: 99.5 F (37.5 C) 97.7 F (36.5 C)  SpO2: 93% 98%    Exam:  Awake alert in some distress Significant swelling right trapezius area Significant right arm swelling and right hemi-torso swelling Chest is clear No icterus no pallor throat is clear Abdomen is soft nontender no rebound Back Euthymic Groin wound on the right side is well-healed slightly indurated on the left side same-did not examine foot wound today  I have personally reviewed the following:   Labs:  Hemoglobin down from 8.9-7.8 platelets 435 no differential  Chem-12 is normal except for glucose 124--- note to trend down from BUN/creatinine 8/1.32 days prior  Imaging studies:  Upper GI small bowel follow-through 7/19  CT abdomen CT chest 7/15-confirmed 2.4 spiculated right upper lobe nodule-?  Third spacing chest abdomen-air-fluid levels abdomen-11 cc volume small fluid collection in right groin?  Hematoma  MRI brain 7/16 cerebral metastatic disease?  Right cervical lymphadenopathy  Medical tests:  none   Test discussed with  performing physician:  no  Decision  to obtain old records:  no  Review and summation of old records:  Yes summarized  Scheduled Meds: . acetaminophen  1,000 mg Oral TID  . atorvastatin  40 mg Oral Daily  . dexamethasone  4 mg Intravenous TID  . dronabinol  2.5 mg Oral BID AC  . DULoxetine  20 mg Oral Daily  . escitalopram  10 mg Oral Daily  . feeding supplement  1 Container Oral TID BM  . fentaNYL  100 mcg Transdermal Q72H  . fondaparinux (ARIXTRA) injection  7.5 mg Subcutaneous q1800  . gabapentin  400 mg Oral TID  . lidocaine  1 patch Transdermal Q24H  . LORazepam  1 mg Oral TID  . mirtazapine  15 mg Oral QHS  . ondansetron  4 mg Oral Q6H  . pantoprazole  40 mg Oral Daily  . senna  1 tablet Oral QHS  . sodium chloride flush  3 mL Intravenous Q12H   Continuous Infusions: . sodium chloride    . sodium chloride      Principal Problem:   Adenocarcinoma ([facility]) Active Problems:   S/P aortobifemoral bypass surgery   Lung mass   Postoperative wound infection   Cervical lymphadenopathy   DVT (deep venous thrombosis) ([facility])   PAD (peripheral artery disease) ([facility])   Gangrene of right foot ([facility])   AKI (acute kidney injury) ([place])   Uncontrolled pain   Palliative care encounter   Neck pain due to malignant neoplastic disease ([place])   Palliative care by specialist   Mass in neck   LOS: 14 days

## 2017-12-31 NOTE — Progress Notes (Signed)
  Radiation Oncology         [phone] ________________________________  Name: Tony [name] MRN: [id number]  Date: 12/31/2017  DOB: [birth year]  SIMULATION AND TREATMENT PLANNING NOTE    ICD-10-CM   1. Adenocarcinoma ([place]) C80.1   2. SVC syndrome I87.1     DIAGNOSIS:  52 yo man with adenocarcinoma of the right upper lung with SVC syndrome  NARRATIVE:  The patient was brought to the [facility].  Identity was confirmed.  All relevant records and images related to the planned course of therapy were reviewed.  The patient freely provided informed written consent to proceed with treatment after reviewing the details related to the planned course of therapy. The consent form was witnessed and verified by the simulation staff.  Then, the patient was set-up in a stable reproducible  supine position for radiation therapy.  CT images were obtained.  Surface markings were placed.  The CT images were loaded into the planning software.  Then the target and avoidance structures were contoured.  Treatment planning then occurred.  The radiation prescription was entered and confirmed.  Then, I designed and supervised the construction of a total of 6 medically necessary complex treatment devices, including a BodyFix immobilization mold custom fitted to the patient along with 5 multileaf collimators conformally shaped radiation around the treatment target while shielding critical structures such as the heart and spinal cord maximally.  I have requested : 3D Simulation  I have requested a DVH of the following structures: Left lung, right lung, spinal cord, heart, esophagus, and target.  I have ordered:Nutrition Consult  SPECIAL TREATMENT PROCEDURE:  The planned course of therapy using radiation constitutes a special treatment procedure. Special care is required in the management of this patient for the following reasons.  The patient will likely be receiving concurrent chemotherapy requiring  careful monitoring for increased toxicities of treatment including periodic laboratory values.  The special nature of the planned course of radiotherapy will require increased physician supervision and oversight to ensure patient's safety with optimal treatment outcomes.  PLAN:  The patient will receive 66 Gy in 33 fractions.  ________________________________  Sheral Apley Tammi Klippel, M.D.

## 2017-12-31 NOTE — Progress Notes (Signed)
Occupational Therapy Treatment Patient Details Name: Tony [name] MRN: [id number] DOB: February 17, 1966 Today's Date: 12/31/2017    History of present illness 52 year old man PMH critical limb ischemia, status post aortobifemoral bypass, right femoral artery to popliteal bypass, status post thrombectomy left limb aVF bypass.  Discharged on Xarelto.  Presented 7/8 with drainage from left groin wound and left below-knee incision.  Admitted by vascular surgery and started on IV antibiotics.  Because of neck pain, further imaging was obtained which revealed spiculated lung mass concerning for malignancy, patient transferred to hospitalist service. Pt found to have metastatic adenocarcinoma.    OT comments  Pt without c/o pain today. Still with RUE edema, but he uses this functionally.  Mostly supervision level. He is able to do ADL except for reach a couple of spots on back and wife assists him.   Follow Up Recommendations  Supervision - Intermittent    Equipment Recommendations  None recommended by OT    Recommendations for Other Services      Precautions / Restrictions Precautions Precautions: Fall Precaution Comments: R neck and arm edema, gangrenous R toes Restrictions Weight Bearing Restrictions: No       Mobility Bed Mobility                  Transfers       Sit to Stand: Modified independent (Device/Increase time)              Balance                                           ADL either performed or assessed with clinical judgement   ADL                                         General ADL Comments: pt reports no difficulty with ADLs.  Used RW safely around room and also walked in hall.       Vision       Perception     Praxis      Cognition Arousal/Alertness: Awake/alert Behavior During Therapy: WFL for tasks assessed/performed Overall Cognitive Status: Within Functional Limits for tasks assessed                                          Exercises     Shoulder Instructions       General Comments      Pertinent Vitals/ Pain       Pain Assessment: No/denies pain  Home Living                                          Prior Functioning/Environment              Frequency  Min 2X/week        Progress Toward Goals  OT Goals(current goals can now be found in the care plan section)  Progress towards OT goals: Progressing toward goals.  Near goal level:  Asked pt to think about anything he is having difficulty with     Plan  Co-evaluation                 AM-PAC PT "6 Clicks" Daily Activity     Outcome Measure   Help from another person eating meals?: None Help from another person taking care of personal grooming?: A Little Help from another person toileting, which includes using toliet, bedpan, or urinal?: A Little Help from another person bathing (including washing, rinsing, drying)?: A Little Help from another person to put on and taking off regular upper body clothing?: A Little Help from another person to put on and taking off regular lower body clothing?: A Little 6 Click Score: 19    End of Session    OT Visit Diagnosis: Muscle weakness (generalized) (M62.81)   Activity Tolerance Patient tolerated treatment well   Patient Left in bed;with call bell/phone within reach;with family/visitor present   Nurse Communication          Time: 1338-1400 OT Time Calculation (min): 22 min  Charges: OT General Charges $OT Visit: 1 Visit OT Treatments $Therapeutic Activity: 8-22 mins  Lesle Chris, OTR/L [phone] 12/31/2017   Egan Sahlin 12/31/2017, 3:32 PM

## 2017-12-31 NOTE — Progress Notes (Addendum)
Marland Kitchen   HEMATOLOGY/ONCOLOGY INPATIENT PROGRESS NOTE  Date of Service: 12/31/2017  Inpatient Attending: .Nita Sells, MD   SUBJECTIVE  Patient was seen in followup for newly diagnosed metastatic poorly differentiated adenocarcinoma likely of lung primary.  Upper GI series showed no overt esophageal or gastric tumor .  Has been seen by radiation oncology and was transferred to [facility] long and today started on palliative radiation for pain control and for impending SVC syndrome due to bulky disease in his chest. He has completed antibiotics for his left groin infection and the wound incisions of healed. Still having significant right upper extremity swelling.  Has extensive bilateral cervical and mediastinal lymphadenopathy. Continues to be on anticoagulation with fondaparinux for his DVT. We discussed the lab studies imaging findings and the role for palliative chemotherapy in the setting.  He is agreeable to this and would like to pursue an aggressive course of treatment understanding that it might not be curative. We discussed starting him on weekly carboplatin + Taxol while on concurrent RT. Pending foundation one and PDL1 testing results   OBJECTIVE:  More comfortable and talkative today. PHYSICAL EXAMINATION: . Vitals:   12/30/17 1628 12/30/17 1914 12/31/17 0455 12/31/17 1347  BP: (!) 147/91 139/83 (!) 144/79 (!) 156/85  Pulse: 82 (!) 113 77 74  Resp:  18 18   Temp: 98.4 F (36.9 C) 99.5 F (37.5 C) 97.7 F (36.5 C)   TempSrc: Oral Oral Oral   SpO2: 98% 93% 98% 98%  Weight:      Height:       Filed Weights   12/18/17 1613  Weight: 157 lb 13.6 oz (71.6 kg)   .Body mass index is 18.72 kg/m.  GENERAL:alert EYES: normal, conjunctiva are pink and non-injected, sclera clear OROPHARYNX:no exudate, no erythema and lips, buccal mucosa, and tongue normal  NECK: supple, mild JVD,significant neck swelling from Lymphadenopathy LYMPH:  b/l cervical bulky lymph-adenopathy,  small axillary LN noted LUNGS: clear to auscultation  HEART: regular rate & rhythm, b/l 2+ lower extremity edema with color changes in toes ABDOMEN: abdomen soft, non-tender, normoactive bowel sounds , cutaneous edema, healing incision. EXT - RUE swelling 3+, minimal LUE swelling. B/l LE swelling. Rt foot gangrenous changes. Rt groin wound healed with no discharge at this time. PSYCH: alert & oriented x 3 with fluent speech NEURO: no focal motor/sensory deficits  MEDICAL HISTORY:  Past Medical History:  Diagnosis Date  . Anxiety   . Arterial occlusive disease    multilevel arterial occlusive disease/notes 11/13/2017  . Arthritis    "left knee" (11/13/2017)  . Chronic back pain   . Chronic lower back pain   . Depression   . Gastric ulcer due to Helicobacter pylori 8676  . GERD (gastroesophageal reflux disease)   . High cholesterol   . Hypertension   . IBS (irritable bowel syndrome)   . Mesenteric adenitis 2011   presumed/H&P  . Migraines    "use to suffer migraines years ago" (11/13/2017)  . Peripheral vascular disease ([place])   . Pneumonia 2011  . Ulcer     SURGICAL HISTORY: Past Surgical History:  Procedure Laterality Date  . AORTA - BILATERAL FEMORAL ARTERY BYPASS GRAFT Bilateral 11/15/2017   Procedure: AORTA BIFEMORAL BYPASS GRAFT;  Surgeon: Angelia Mould, MD;  Location: [place];  Service: Vascular;  Laterality: Bilateral;  . BACK SURGERY  X 4   "procedure where they burn the nerves every 6 months"  . EMBOLECTOMY Left 12/01/2017   Procedure: THROMBECTOMY OF LEFT  AORTAFEMORAL BYPASS, THROMBECTOMY OF TIBIAL ARTERY;  Surgeon: Rosetta Posner, MD;  Location: [facility] OR;  Service: Vascular;  Laterality: Left;  . ESOPHAGOGASTRODUODENOSCOPY  08/22/2011   Procedure: ESOPHAGOGASTRODUODENOSCOPY (EGD);  Surgeon: [name], MD;  Location: [place];  Service: Gastroenterology;  Laterality: N/A;  . FEMORAL-POPLITEAL BYPASS GRAFT Right 11/15/2017   Procedure: Right FEMORAL-Above knee  POPLITEAL ARTERY Bypass Graft using nonreversed saphenous vein ;  Surgeon: Angelia Mould, MD;  Location: [place];  Service: Vascular;  Laterality: Right;  . FEMORAL-POPLITEAL BYPASS GRAFT Left 12/01/2017   Procedure: BYPASS GRAFT FEMORAL- ABOVE KNEE POPLITEAL ARTERY WITH VEIN;  Surgeon: Rosetta Posner, MD;  Location: [place];  Service: Vascular;  Laterality: Left;  . INTRAOPERATIVE ARTERIOGRAM Right 11/15/2017   Procedure: INTRA OPERATIVE ARTERIOGRAM;  Surgeon: Angelia Mould, MD;  Location: [place];  Service: Vascular;  Laterality: Right;  . INTRAOPERATIVE ARTERIOGRAM Left 12/01/2017   Procedure: INTRA OPERATIVE ARTERIOGRAM OF LEFT LEG;  Surgeon: Rosetta Posner, MD;  Location: [place];  Service: Vascular;  Laterality: Left;  . IR US GUIDE BX ASP/DRAIN  12/18/2017  . KNEE ARTHROSCOPY Left X 5    SOCIAL HISTORY: Social History   Socioeconomic History  . Marital status: Married    Spouse name: Not on file  . Number of children: 3  . Years of education: Not on file  . Highest education level: Not on file  Occupational History  . Occupation: Admin. Assistant    Employer: [employer]  Social Needs  . Financial resource strain: Not on file  . Food insecurity:    Worry: Not on file    Inability: Not on file  . Transportation needs:    Medical: Not on file    Non-medical: Not on file  Tobacco Use  . Smoking status: Former Smoker    Packs/day: 1.00    Years: 24.00    Pack years: 24.00    Types: Cigarettes    Start date: 11/28/2017    Last attempt to quit: 12/04/2017    Years since quitting: 0.0  . Smokeless tobacco: Never Used  Substance and Sexual Activity  . Alcohol use: Yes    Comment: 11/13/2017 "did drink wine q  1-2 months; nothing anymore"   . Drug use: Not Currently    Types: Marijuana    Comment: 11/13/2017  "last drug use was in ~ 1987 "  . Sexual activity: Not on file  Lifestyle  . Physical activity:    Days per week: Not on file    Minutes per session: Not on file    . Stress: Not on file  Relationships  . Social connections:    Talks on phone: Not on file    Gets together: Not on file    Attends religious service: Not on file    Active member of club or organization: Not on file    Attends meetings of clubs or organizations: Not on file    Relationship status: Not on file  . Intimate partner violence:    Fear of current or ex partner: Not on file    Emotionally abused: Not on file    Physically abused: Not on file    Forced sexual activity: Not on file  Other Topics Concern  . Not on file  Social History Narrative   Lives with wife and two children.  Administrator at [employer]: Family History  Problem Relation Age of Onset  . Hypertension Mother   .  Hypertension Father   . Coronary artery disease Father 49  . Heart attack Brother 37  . Coronary artery disease Brother   . Diabetes Neg Hx   . Stroke Neg Hx     ALLERGIES:  is allergic to pork-derived products; shellfish-derived products; and morphine and related.  MEDICATIONS:  Scheduled Meds: . acetaminophen  1,000 mg Oral TID  . atorvastatin  40 mg Oral Daily  . dexamethasone  4 mg Intravenous TID  . dronabinol  2.5 mg Oral BID AC  . DULoxetine  20 mg Oral Daily  . escitalopram  10 mg Oral Daily  . feeding supplement  1 Container Oral TID BM  . fentaNYL  100 mcg Transdermal Q72H  . fondaparinux (ARIXTRA) injection  7.5 mg Subcutaneous q1800  . gabapentin  400 mg Oral TID  . lidocaine  1 patch Transdermal Q24H  . LORazepam  1 mg Oral TID  . metoprolol succinate  100 mg Oral QHS  . mirtazapine  15 mg Oral QHS  . ondansetron  4 mg Oral Q6H  . pantoprazole  40 mg Oral Daily  . senna  1 tablet Oral QHS  . sodium chloride flush  3 mL Intravenous Q12H   Continuous Infusions: . sodium chloride    . sodium chloride     PRN Meds:.sodium chloride, sodium chloride, alum & mag hydroxide-simeth, bisacodyl, guaiFENesin-dextromethorphan, hydrALAZINE, HYDROmorphone  (DILAUDID) injection, HYDROmorphone, labetalol, LORazepam, naLOXone (NARCAN)  injection, phenol, polyethylene glycol, promethazine, sodium chloride flush  REVIEW OF SYSTEMS:    10 Point review of Systems was done is negative except as noted above.   LABORATORY DATA:  I have reviewed the data as listed  . CBC Latest Ref Rng & Units 12/29/2017 12/22/2017 12/21/2017  WBC 4.0 - 10.5 K/uL 9.4 8.4 9.0  Hemoglobin 13.0 - 17.0 g/dL 7.8(L) 8.9(L) 8.3(L)  Hematocrit 39.0 - 52.0 % 25.5(L) 28.7(L) 27.0(L)  Platelets 150 - 400 K/uL 435(H) 453(H) 399   . CBC    Component Value Date/Time   WBC 9.4 12/29/2017 0315   RBC 2.70 (L) 12/29/2017 0315   HGB 7.8 (L) 12/29/2017 0315   HCT 25.5 (L) 12/29/2017 0315   PLT 435 (H) 12/29/2017 0315   MCV 94.4 12/29/2017 0315   MCH 28.9 12/29/2017 0315   MCHC 30.6 12/29/2017 0315   RDW 14.3 12/29/2017 0315   LYMPHSABS 0.8 12/17/2017 1207   MONOABS 0.6 12/17/2017 1207   EOSABS 0.2 12/17/2017 1207   BASOSABS 0.1 12/17/2017 1207    CMP Latest Ref Rng & Units 12/29/2017 12/26/2017 12/25/2017  Glucose 70 - 99 mg/dL 124(H) 113(H) 92  BUN 6 - 20 mg/dL 6 5(L) 8  Creatinine 0.61 - 1.24 mg/dL 1.07 1.16 1.31(H)  Sodium 135 - 145 mmol/L 143 140 138  Potassium 3.5 - 5.1 mmol/L 3.6 4.0 4.3  Chloride 98 - 111 mmol/L 108 111 111  CO2 22 - 32 mmol/L 25 20(L) 18(L)  Calcium 8.9 - 10.3 mg/dL 8.4(L) 8.6(L) 8.5(L)  Total Protein 6.5 - 8.1 g/dL 5.8(L) - -  Total Bilirubin 0.3 - 1.2 mg/dL 0.5 - -  Alkaline Phos 38 - 126 U/L 82 - -  AST 15 - 41 U/L 19 - -  ALT 0 - 44 U/L 14 - -    RADIOGRAPHIC STUDIES: I have personally reviewed the radiological images as listed and agreed with the findings in the report. Ct Soft Tissue Neck W Contrast  Result Date: 12/17/2017 CLINICAL DATA:  52 y/o M; recently removed right neck PICC  line with swelling and pain in the region of PICC line. EXAM: CT NECK WITH CONTRAST TECHNIQUE: Multidetector CT imaging of the neck was performed using the  standard protocol following the bolus administration of intravenous contrast. CONTRAST:  100 cc Omnipaque 300 COMPARISON:  None. FINDINGS: Pharynx and larynx: Normal. No mass or swelling. Salivary glands: No inflammation, mass, or stone. Thyroid: Normal. Lymph nodes: Right cervical, bilateral supraclavicular, and upper mediastinal bulky lymphadenopathy. A right upper paratracheal lymph node measures 5.0 x 3.5 cm (series 3, image 109), left supraclavicular node measures 3.1 x 1.7 cm (series 3, image 85), and a right supraclavicular node measures 1.6 x 2.1 cm (series 3, image 68). Vascular: Deep venous thrombosis involving right subclavian vein, right brachiocephalic vein, and right internal jugular vein. Limited intracranial: Negative. Visualized orbits: Negative. Mastoids and visualized paranasal sinuses: Small right maxillary sinus mucous retention cyst. Skeleton: No acute or aggressive process. Upper chest: Spiculated nodule in the right upper lobe measuring 16 x 12 mm (series 3, image 115). Other: Mild edema in the right supraclavicular fossa and lower neck. IMPRESSION: 1. Deep venous thrombosis involving the right subclavian vein, right brachiocephalic vein, and right internal jugular vein. 2. Edema in the right lower neck and supraclavicular fossa is probably related to venous congestion from DVT and/or phlebitis. No abscess. 3. Severe bulky lymphadenopathy of right cervical, bilateral supraclavicular, and upper mediastinal lymph nodes likely representing metastatic disease or lymphoproliferative disease. 4. Spiculated right upper lobe nodule measuring up to 16 mm. Consider one of the following in 3 months for both low-risk and high-risk individuals: (a) repeat chest CT, (b) follow-up PET-CT, or (c) tissue sampling. This recommendation follows the consensus statement: Guidelines for Management of Incidental Pulmonary Nodules Detected on CT Images: From the Fleischner Society 2017; Radiology 2017; 284:228-243.  These results will be called to the ordering clinician or representative by the Radiologist Assistant, and communication documented in the PACS or zVision Dashboard. Electronically Signed   By: Kristine Garbe M.D.   On: 12/17/2017 15:04   Ct Chest W Contrast  Result Date: 12/25/2017 CLINICAL DATA:  Spiculated lung mass and adenopathy shown on neck CT, for further workup. EXAM: CT CHEST, ABDOMEN, AND PELVIS WITH CONTRAST TECHNIQUE: Multidetector CT imaging of the chest, abdomen and pelvis was performed following the standard protocol during bolus administration of intravenous contrast. CONTRAST:  174m OMNIPAQUE IOHEXOL 300 MG/ML  SOLN COMPARISON:  12/17/2017 and CT abdomen from 08/14/2011 there is some mild fatty infiltration in segment IV adjacent to the falciform ligament. Otherwise unremarkable. FINDINGS: CT CHEST FINDINGS Cardiovascular: Aortic arch and branch vessel atherosclerotic vascular disease. Poor visualization of contrast in the right internal jugular vein, compatible with patient's known venous thrombosis. Mediastinum/Nodes: Bilateral lower neck level IV adenopathy with ill definition. A left level Roman numeral 4 lymph node measures 2.0 cm in short axis on image 2/3. There is also bilateral supraclavicular adenopathy including a 1.9 cm right-sided supraclavicular node on image 7/3. Bulky paratracheal, prevascular, subcarinal, and right hilar adenopathy noted. An index right paratracheal node measures 3.6 cm in short axis on image 16/3. Enlarged right subpectoral lymph nodes are present along with scattered right axillary lymph nodes. Mild stranding in the anterior mediastinum. Lungs/Pleura: Moderate right and small left pleural effusion are present and are nonspecific for transudative versus exudative etiology. A spiculated right upper lobe nodule measures 2.4 by 1.3 by 1.4 cm, roughly stable from 12/17/2017. There is passive atelectasis in both lower lobes, right greater than left.  Paraseptal emphysema noted.  Musculoskeletal: Notable subcutaneous edema throughout the chest, somewhat confluent along the right upper chest. CT ABDOMEN PELVIS FINDINGS Hepatobiliary: Unremarkable Pancreas: Unremarkable Spleen: Unremarkable Adrenals/Urinary Tract: Unremarkable Stomach/Bowel: Air-fluid levels in the rectum indicating diarrheal process. Borderline wall thickening several segments of proximal small bowel. Vascular/Lymphatic: Aortoiliac atherosclerotic vascular disease. Aortobifemoral graft small fluid collection superficial to the right groin likely representing residual hematoma from prior groin puncture, but only measuring about 2.4 by 2.1 by 4.3 cm (volume = 11 cm^3). No gas in this collection. Left inguinal lymph node 0.9 cm in short axis. Reproductive: Unremarkable Other: Diffuse subcutaneous edema. Small amount of pelvic ascites. Gas tracking along the anterior abdominal wall subcutaneous tissues, correlate with any anterior abdominal wall injections. Subcutaneous edema extends along the pubis and towards the penis. Musculoskeletal: Unremarkable IMPRESSION: 1. Bulky adenopathy in the neck and upper chest. Persistent 2.4 cm spiculated right upper lobe nodule. The degree of adenopathy seems disproportionate to the size of the nodule, and this could represent adenopathy related to lung cancer or potentially 2 separate processes such as lymphoproliferative disease and a separate primary lung cancer. An infectious cause for the pulmonary nodule is less likely. 2. Extensive third spacing of fluid in the chest, abdomen and pelvis. Query hypoproteinemia or hypoalbuminemia. 3. Poor visualization of contrast in the right internal jugular vein compatible with the patient's known venous thrombosis. 4. Air fluid levels in the rectum indicating diarrheal process. Borderline wall thickening in the proximal small bowel, query proximal enteritis. 5. The small fluid collection superficial to the right groin  puncture site is only about 11 cc in volume, and probably reflects a resolving hematoma. 6. Gas tracking along anterior abdominal wall subcutaneous tissues, probably from injections-correlate with any anterior abdominal wall subcutaneous injections recently. Electronically Signed   By: Van Clines M.D.   On: 12/25/2017 00:05   Mr Jeri Cos IR Contrast  Result Date: 12/25/2017 CLINICAL DATA:  52 year old male with right upper extremity and right central venous DVT with bulky cervical and thoracic lymphadenopathy and spiculated right apical lung nodule. EXAM: MRI HEAD WITHOUT AND WITH CONTRAST TECHNIQUE: Multiplanar, multiecho pulse sequences of the brain and surrounding structures were obtained without and with intravenous contrast. CONTRAST:  49m MULTIHANCE GADOBENATE DIMEGLUMINE 529 MG/ML IV SOLN COMPARISON:  Neck CT 12/17/2017. Head CT without contrast 08/03/2006. Neck and chest CT FINDINGS: Brain: No midline shift, mass effect, or evidence of intracranial mass lesion. No abnormal enhancement identified. No dural thickening. No restricted diffusion to suggest acute infarction. No ventriculomegaly, extra-axial collection or acute intracranial hemorrhage. Cervicomedullary junction and pituitary are within normal limits. GPearline Cablesand white matter signal is within normal limits for age throughout the brain; Minimal nonspecific left frontal lobe white matter T2 and FLAIR hyperintensity (series 6, image 19). No cortical encephalomalacia or chronic cerebral blood products. Vascular: Major intracranial vascular flow voids are preserved. The major dural venous sinuses are enhancing and appear patent. The right IJ bulb at the skull base is patent. Skull and upper cervical spine: Negative visible cervical spine and spinal cord. Visualized bone marrow signal is within normal limits. Sinuses/Orbits: Negative orbits soft tissues. Paranasal sinuses and mastoids are stable and well pneumatized. Other: Visible internal  auditory structures appear normal. Partially visible bulky right neck lymphadenopathy (series 4, image 14). IMPRESSION: 1. No cerebral metastatic disease or acute intracranial abnormality identified. 2. Right cervical lymphadenopathy redemonstrated. Electronically Signed   By: HGenevie AnnM.D.   On: 12/25/2017 09:17   Ct Abdomen Pelvis W Contrast  Result Date:  12/25/2017 CLINICAL DATA:  Spiculated lung mass and adenopathy shown on neck CT, for further workup. EXAM: CT CHEST, ABDOMEN, AND PELVIS WITH CONTRAST TECHNIQUE: Multidetector CT imaging of the chest, abdomen and pelvis was performed following the standard protocol during bolus administration of intravenous contrast. CONTRAST:  135m OMNIPAQUE IOHEXOL 300 MG/ML  SOLN COMPARISON:  12/17/2017 and CT abdomen from 08/14/2011 there is some mild fatty infiltration in segment IV adjacent to the falciform ligament. Otherwise unremarkable. FINDINGS: CT CHEST FINDINGS Cardiovascular: Aortic arch and branch vessel atherosclerotic vascular disease. Poor visualization of contrast in the right internal jugular vein, compatible with patient's known venous thrombosis. Mediastinum/Nodes: Bilateral lower neck level IV adenopathy with ill definition. A left level Roman numeral 4 lymph node measures 2.0 cm in short axis on image 2/3. There is also bilateral supraclavicular adenopathy including a 1.9 cm right-sided supraclavicular node on image 7/3. Bulky paratracheal, prevascular, subcarinal, and right hilar adenopathy noted. An index right paratracheal node measures 3.6 cm in short axis on image 16/3. Enlarged right subpectoral lymph nodes are present along with scattered right axillary lymph nodes. Mild stranding in the anterior mediastinum. Lungs/Pleura: Moderate right and small left pleural effusion are present and are nonspecific for transudative versus exudative etiology. A spiculated right upper lobe nodule measures 2.4 by 1.3 by 1.4 cm, roughly stable from 12/17/2017.  There is passive atelectasis in both lower lobes, right greater than left. Paraseptal emphysema noted. Musculoskeletal: Notable subcutaneous edema throughout the chest, somewhat confluent along the right upper chest. CT ABDOMEN PELVIS FINDINGS Hepatobiliary: Unremarkable Pancreas: Unremarkable Spleen: Unremarkable Adrenals/Urinary Tract: Unremarkable Stomach/Bowel: Air-fluid levels in the rectum indicating diarrheal process. Borderline wall thickening several segments of proximal small bowel. Vascular/Lymphatic: Aortoiliac atherosclerotic vascular disease. Aortobifemoral graft small fluid collection superficial to the right groin likely representing residual hematoma from prior groin puncture, but only measuring about 2.4 by 2.1 by 4.3 cm (volume = 11 cm^3). No gas in this collection. Left inguinal lymph node 0.9 cm in short axis. Reproductive: Unremarkable Other: Diffuse subcutaneous edema. Small amount of pelvic ascites. Gas tracking along the anterior abdominal wall subcutaneous tissues, correlate with any anterior abdominal wall injections. Subcutaneous edema extends along the pubis and towards the penis. Musculoskeletal: Unremarkable IMPRESSION: 1. Bulky adenopathy in the neck and upper chest. Persistent 2.4 cm spiculated right upper lobe nodule. The degree of adenopathy seems disproportionate to the size of the nodule, and this could represent adenopathy related to lung cancer or potentially 2 separate processes such as lymphoproliferative disease and a separate primary lung cancer. An infectious cause for the pulmonary nodule is less likely. 2. Extensive third spacing of fluid in the chest, abdomen and pelvis. Query hypoproteinemia or hypoalbuminemia. 3. Poor visualization of contrast in the right internal jugular vein compatible with the patient's known venous thrombosis. 4. Air fluid levels in the rectum indicating diarrheal process. Borderline wall thickening in the proximal small bowel, query proximal  enteritis. 5. The small fluid collection superficial to the right groin puncture site is only about 11 cc in volume, and probably reflects a resolving hematoma. 6. Gas tracking along anterior abdominal wall subcutaneous tissues, probably from injections-correlate with any anterior abdominal wall subcutaneous injections recently. Electronically Signed   By: WVan ClinesM.D.   On: 12/25/2017 00:05   Ir UKoreaGuide Bx Asp/drain  Result Date: 12/18/2017 INDICATION: 52year old male with a history of neck mass EXAM: IR ULTRASOUND GUIDANCE MEDICATIONS: None. ANESTHESIA/SEDATION: Moderate (conscious) sedation was employed during this procedure. A total of Versed 2.0 mg  and Fentanyl 100 mcg was administered intravenously. Moderate Sedation Time: 12 minutes. The patient's level of consciousness and vital signs were monitored continuously by radiology nursing throughout the procedure under my direct supervision. FLUOROSCOPY TIME:  None COMPLICATIONS: None PROCEDURE: Informed written consent was obtained from the patient after a thorough discussion of the procedural risks, benefits and alternatives. All questions were addressed. Maximal Sterile Barrier Technique was utilized including caps, mask, sterile gowns, sterile gloves, sterile drape, hand hygiene and skin antiseptic. A timeout was performed prior to the initiation of the procedure. Patient positioned supine position on the ultrasound table. Ultrasound images of the right neck were performed with images stored sent to PACs. Patient is then prepped and draped in the usual sterile fashion. 1% lidocaine was used for local anesthesia. Using ultrasound guidance, multiple 18 gauge core biopsy performed of right neck mass. Tissue specimen placed in the saline. Sterile bandage was placed. Patient tolerated the procedure well and remained hemodynamically stable throughout. No complications were encountered and no significant blood loss. IMPRESSION: Status post  ultrasound-guided biopsy of right neck mass. Signed, Dulcy Fanny. Dellia Nims, RPVI [facility] Radiology Electronically Signed   By: Corrie Mckusick D.O.   On: 12/18/2017 17:00   Dg Duanne Limerick W/small Bowel  Result Date: 12/28/2017 CLINICAL DATA:  Metastatic adenocarcinoma of unknown primary. EXAM: UPPER GI SERIES WITH SMALL BOWEL FOLLOW-THROUGH FLUOROSCOPY TIME:  Fluoroscopy Time:  3 minutes 6 seconds TECHNIQUE: Combined double contrast and single contrast upper GI series using effervescent crystals, thick barium, and thin barium. Subsequently, serial images of the small bowel were obtained including spot views of the terminal ileum. COMPARISON:  CT scan dated 12/24/2017 FINDINGS: The esophagus appears normal. The fundus, body, and antrum of the stomach are normal. Pylorus is normal. There is slight edema of the mucosa of the duodenal bulb but the bulb expands and contracts without a mass. The jejunum and ileum appear normal including the terminal ileum. Small bowel transit time was 50 minutes, normal. IMPRESSION: Slight edema of the mucosa of the duodenal bulb. Otherwise normal upper GI and small-bowel follow-through. No visible mass lesions in the stomach or small bowel. Electronically Signed   By: Lorriane Shire M.D.   On: 12/28/2017 09:47    ASSESSMENT & PLAN:    52 yo male with   1. Right upper lobe nodule and extensive mediastinal and cervical lymphadenopathy concerning for metastatic lung adenocarcinoma -Biopsy consistent with poorly differentiated adenocarcinoma likely lung or UGI primary (though no imaging evidence of primary tumor other than lung cancer) CT abd no overt disease. MRI brain neg for metastatic disease UGI series- no evidence of UGI malignancy  2. Metastatic cancer related hypercoagulable status   3. Extensive Deep venous thrombosis involving the right subclavian vein, right brachiocephalic vein, and right internal jugular vein - due to  cancer + vascular surgery + surgical site infection  4. Rt proximal upper venous compression by bulky mediastinal tumor with impending SVC syndrome.  4. PAD with recent vascular bypass and rt foot gangrene. Rt grion infection -resolved s/p antibiotics.  5. Cancer related pain - improved-- mx by palliative care  PLAN -discussed CT neck/chest/abd/pelvis and MRI brain -- consistent with metastatic poorly differentiated adenocarcinoma likely lung primary vs UGI. Imaging not suggestive of alternative primary site. -requested foundation One and PDL1 testing on pathology sample to determine palliative treatment options-pending -radiation oncology input appreciated -- started on palliative RT from today. -discussed and obtained informed consent for concurrent chemotherapy with  carboplatin/Taxol weekly while on RT . Orders placed for chemotherapy -- if delay in discharge will need to give first dose as inpatient. -will need consideration of left upper extremity PICC line placement. -patient is aware that all treatments are palliative only and not curative. -on fondaparinux for anticoagulation for cancer related DVT-- will need to  -palliative care following for ongoing pain mx  5. Left groin would infection post-op - resolved  6. Anemia due to metastatic cancer, blood loss from recent surgery. Plan -transfuse prn for hgb <8 to tolerate tx and for wound healing  . The total time spent in the appointment was 35 minutes and more than 50% was on counseling and direct patient cares.     Sullivan Lone MD Blue AAHIVMS [facility] Hematology/Oncology Physician [facility]  (Office):       [phone] (Work cell):  [phone] (Fax):           [phone]  12/31/2017 5:45 PM

## 2018-01-01 ENCOUNTER — Ambulatory Visit
Admit: 2018-01-01 | Discharge: 2018-01-01 | Disposition: A | Discharge status: Home / Self Care | Payer: BLUE CROSS/BLUE SHIELD | Attending: Radiation Oncology | Admitting: Radiation Oncology

## 2018-01-01 DIAGNOSIS — Z7189 Other specified counseling: Secondary | ICD-10-CM

## 2018-01-01 DIAGNOSIS — C801 Malignant (primary) neoplasm, unspecified: Secondary | ICD-10-CM

## 2018-01-01 LAB — CBC WITH DIFFERENTIAL/PLATELET
Basophils Absolute: 0 10*3/uL (ref 0.0–0.1)
Basophils Relative: 0 %
Eosinophils Absolute: 0 10*3/uL (ref 0.0–0.7)
Eosinophils Relative: 0 %
HEMATOCRIT: 26.7 % — AB (ref 39.0–52.0)
Hemoglobin: 8.1 g/dL — ABNORMAL LOW (ref 13.0–17.0)
LYMPHS ABS: 0.6 10*3/uL — AB (ref 0.7–4.0)
LYMPHS PCT: 4 %
MCH: 28.1 pg (ref 26.0–34.0)
MCHC: 30.3 g/dL (ref 30.0–36.0)
MCV: 92.7 fL (ref 78.0–100.0)
MONO ABS: 0.7 10*3/uL (ref 0.1–1.0)
MONOS PCT: 4 %
NEUTROS ABS: 13.9 10*3/uL — AB (ref 1.7–7.7)
Neutrophils Relative %: 92 %
Platelets: 495 10*3/uL — ABNORMAL HIGH (ref 150–400)
RBC: 2.88 MIL/uL — ABNORMAL LOW (ref 4.22–5.81)
RDW: 14.4 % (ref 11.5–15.5)
WBC: 15.2 10*3/uL — ABNORMAL HIGH (ref 4.0–10.5)

## 2018-01-01 LAB — RENAL FUNCTION PANEL
Albumin: 2.3 g/dL — ABNORMAL LOW (ref 3.5–5.0)
Anion gap: 10 (ref 5–15)
BUN: 14 mg/dL (ref 6–20)
CO2: 26 mmol/L (ref 22–32)
Calcium: 8.5 mg/dL — ABNORMAL LOW (ref 8.9–10.3)
Chloride: 109 mmol/L (ref 98–111)
Creatinine, Ser: 0.9 mg/dL (ref 0.61–1.24)
GFR calc Af Amer: >60 mL/min (ref >60)
GFR calc non Af Amer: >60 mL/min (ref >60)
GLUCOSE: 164 mg/dL — AB (ref 70–99)
POTASSIUM: 3.9 mmol/L (ref 3.5–5.1)
Phosphorus: 1.8 mg/dL — ABNORMAL LOW (ref 2.5–4.6)
Sodium: 145 mmol/L (ref 135–145)

## 2018-01-01 MED ORDER — JUVEN PO PACK
1.0000 | PACK | Freq: Two times a day (BID) | ORAL | Status: DC
  Administered 2018-01-01 – 2018-01-23 (×35): 1 via ORAL
  Filled 2018-01-01 (×45): qty 1

## 2018-01-01 MED ORDER — DULOXETINE HCL 30 MG PO CPEP
30.0000 mg | ORAL_CAPSULE | Freq: Every day | ORAL | Status: DC
  Administered 2018-01-02 – 2018-01-04 (×3): 30 mg via ORAL
  Filled 2018-01-01 (×3): qty 1

## 2018-01-01 MED ORDER — ESCITALOPRAM OXALATE 10 MG PO TABS
5.0000 mg | ORAL_TABLET | Freq: Every day | ORAL | Status: DC
  Administered 2018-01-02 – 2018-01-04 (×3): 5 mg via ORAL
  Filled 2018-01-01 (×3): qty 1

## 2018-01-01 MED ORDER — FENTANYL 100 MCG/HR TD PT72
150.0000 ug | MEDICATED_PATCH | TRANSDERMAL | Status: DC
  Administered 2018-01-01: 150 ug via TRANSDERMAL
  Filled 2018-01-01: qty 1

## 2018-01-01 NOTE — Progress Notes (Signed)
Initial Nutrition Assessment  DOCUMENTATION CODES:   Not applicable  INTERVENTION:    Boost Breeze po TID, each supplement provides 250 kcal and 9 grams of protein  Juven Fruit Punch BID, each serving provides 95kcal and 2.5g of protein (amino acids glutamine and arginine)  Obtain recent weight to determine overall weight loss  NUTRITION DIAGNOSIS:   Increased nutrient needs related to cancer and cancer related treatments, wound healing as evidenced by estimated needs.  GOAL:   Patient will meet greater than or equal to 90% of their needs  MONITOR:   PO intake, Supplement acceptance, Weight trends, Labs  REASON FOR ASSESSMENT:   LOS    ASSESSMENT:   Patient with PMH significant for HTN, IBS, and gastric ulcer. Presented to [facility] 7/8 with right foot pain and discoloration. He was found to have left groin infection, PAD with right foot gangrene, and poorly differentiated adenocarcinoma of unclear primary- suspect lung cancer in right upper lobe. Was transferred to [facility] 7/21 to begin palliative radiation treatments for pain control.    Pt reports having a decrease in appetite after his surgery (aortobifemoralbypass-right FEM- pop-thrombectomy LLE E AV F bypass) in the beginning of June. States during this time period he could tolerate bites of each meal due to increasing pain. During this admission he was prescribed Marinol BID which he claims has helped to increase his intake significantly. He reports eating some of what is served at the hospital with outside food as well. He consumed 100% of his breakfast without complication. He would like to continue with Boost Breeze, as he drinks these TID. Discussed the importance of protein intake for preservation of lean body mass and wound healing. Pt is amenable to the addition of Juven.   Pt endorses a UBW of 180 lb. Records indicate pt weighed 179 lb 6/25 and 157 lb 7/9. Will need to obtain a recent wt to determine recent wt trend.  Nutrition-Focused physical exam completed. Fluid accumulation may be masking losses. Suspect pt has some form of malnutrition but will need to obtain more weight information.   Medications reviewed and include: Marinol BID, fentanyl, remeron Labs reviewed: Phosphorus 1.8 (L)  NUTRITION - FOCUSED PHYSICAL EXAM:    Most Recent Value  Orbital Region  No depletion  Upper Arm Region  No depletion  Thoracic and Lumbar Region  Unable to assess  Buccal Region  No depletion  Temple Region  No depletion  Clavicle Bone Region  No depletion  Clavicle and Acromion Bone Region  No depletion  Scapular Bone Region  Unable to assess  Dorsal Hand  No depletion  Patellar Region  Mild depletion  Anterior Thigh Region  Mild depletion  Posterior Calf Region  Mild depletion  Edema (RD Assessment)  None  Hair  Reviewed  Eyes  Reviewed  Mouth  Reviewed  Skin  Reviewed  Nails  Reviewed     Diet Order:   Diet Order           Diet regular Room service appropriate? Yes; Fluid consistency: Thin  Diet effective now          EDUCATION NEEDS:   Education needs have been addressed  Skin:  Skin Assessment: Skin Integrity Issues: Skin Integrity Issues:: Incisions, Other (Comment) Incisions: neck, left leg Other: toes on right foot demarcating   Last BM:  12/31/17  Height:   Ht Readings from Last 1 Encounters:  12/18/17 6\' 5"  (1.956 m)    Weight:   Wt Readings from Last  1 Encounters:  12/18/17 157 lb 13.6 oz (71.6 kg)    Ideal Body Weight:  94.5 kg  BMI:  Body mass index is 18.72 kg/m.  Estimated Nutritional Needs:   Kcal:  2500-2700 kcal  Protein:  125-135 grams  Fluid:  >2.5 L/day    Mariana Single RD, LDN Clinical Nutrition Pager # - [phone]

## 2018-01-01 NOTE — Progress Notes (Signed)
   01/01/18 1000  Clinical Encounter Type  Visited With Patient  Visit Type Initial;Psychological support;Spiritual support  Referral From Nurse  Consult/Referral To Chaplain  Spiritual Encounters  Spiritual Needs Literature;Emotional;Other (Comment) (Spiritual Care Conversation/Support)  Stress Factors  Patient Stress Factors None identified   I visited with the patient per Spiritual Care consult. The patient has been in pain and needed support.  The patient was on the phone at the time of my visit and did not identify any needs.  I printed the patient off materials on deep breathing and relaxation exercises that might help with his ability to cope with his pain.   Please, contact Spiritual Care for further assistance.   Chaplain Shanon Ace M.Div., [facility]

## 2018-01-01 NOTE — Progress Notes (Signed)
Physical Therapy Treatment Patient Details Name: Tony [name] MRN: [id number] DOB: [birth year] Today's Date: 01/01/2018    History of Present Illness 52 year old man PMH critical limb ischemia, status post aortobifemoral bypass, right femoral artery to popliteal bypass, status post thrombectomy left limb aVF bypass.  Discharged on Xarelto.  Presented 7/8 with drainage from left groin wound and left below-knee incision.  Admitted by vascular surgery and started on IV antibiotics.  Because of neck pain, further imaging was obtained which revealed spiculated lung mass concerning for malignancy, patient transferred to hospitalist service. Pt found to have metastatic adenocarcinoma.     PT Comments    The patient  Ambulated x 440', became slightly off balanced and dizzy. BP 154/74. Continue PT.   Follow Up Recommendations  Home health PT;Supervision - Intermittent     Equipment Recommendations  None recommended by PT    Recommendations for Other Services OT consult     Precautions / Restrictions Precautions Precautions: Fall Precaution Comments: R neck and arm edema, gangrenous R toes, patient had a weak spell today at end of walk    Mobility  Bed Mobility         Supine to sit: Supervision        Transfers Overall transfer level: Needs assistance Equipment used: Rolling walker (2 wheeled) Transfers: Sit to/from Stand Sit to Stand: Supervision         General transfer comment: Min Guard A for stability and safety  Ambulation/Gait Ambulation/Gait assistance: Supervision;Min assist Gait Distance (Feet): 440 Feet Assistive device: Rolling walker (2 wheeled) Gait Pattern/deviations: Decreased stride length;Trunk flexed;Step-through pattern Gait velocity: decr   General Gait Details: at end of ambulation, patient reported feeling dizzy and has=d slight balance loss when He turned to speak to CNA. Chair brought up. BP 157/ 74. RN aware.    Stairs              Wheelchair Mobility    Modified Rankin (Stroke Patients Only)       Balance                                            Cognition Arousal/Alertness: Awake/alert Behavior During Therapy: WFL for tasks assessed/performed Overall Cognitive Status: Within Functional Limits for tasks assessed                                        Exercises      General Comments        Pertinent Vitals/Pain Faces Pain Scale: Hurts a little bit Pain Location: neck Pain Descriptors / Indicators: Discomfort Pain Intervention(s): Monitored during session;Premedicated before session    Home Living                      Prior Function            PT Goals (current goals can now be found in the care plan section) Progress towards PT goals: Progressing toward goals    Frequency    Min 3X/week      PT Plan Current plan remains appropriate    Co-evaluation              AM-PAC PT "6 Clicks" Daily Activity  Outcome Measure  Difficulty turning over in bed (including adjusting bedclothes, sheets  and blankets)?: None Difficulty moving from lying on back to sitting on the side of the bed? : None Difficulty sitting down on and standing up from a chair with arms (e.g., wheelchair, bedside commode, etc,.)?: A Little Help needed moving to and from a bed to chair (including a wheelchair)?: A Little Help needed walking in hospital room?: A Little Help needed climbing 3-5 steps with a railing? : A Little 6 Click Score: 20    End of Session   Activity Tolerance: Patient tolerated treatment well Patient left: in chair;with call bell/phone within reach;with nursing/sitter in room Nurse Communication: Mobility status PT Visit Diagnosis: Other abnormalities of gait and mobility (R26.89);Pain Pain - Right/Left: Right     Time: 7672-0947 PT Time Calculation (min) (ACUTE ONLY): 20 min  Charges:  $Gait Training: 8-22 mins                     G CodesTresa Endo PT [phone]    Claretha Cooper 01/01/2018, 2:12 PM

## 2018-01-01 NOTE — Progress Notes (Signed)
[facility] PROGRESS NOTE  Tony [name] VHQ:[id number] DOB: [birth year] DOA: 12/17/2017 PCP: Tony Amel, MD  Narrative: 52 year old male Significant vascular history status post aortobifemoral bypass-right FEM- pop-thrombectomy LLE E AV F bypass-all managed by vascular surgery-knee incision with postop wound infection and cellulitis LLE Found to have neck pain with spiculated lung mass concerning for malignancy-patient was transferred under the care of the hospitalist service after discussion with Dr. Tammi [name] of radiation oncology for consideration of pain management SVC syndrome   A & Plan Poorly differentiated adenocarcinoma probable lung-D/W Dr. Kathlen [name] XRT-  pain regimen per Palliatve team--no changes today I have explained to him that XRT will help with some of the pain  Has chr habitation from 5 L knee surgeries--he is still requiring hi amounts of pain meds and will need further adjustments He declines PCA Dr. Irene [name] to consider OP chemo--need access--if starting Chemo in hospital his medical problems are STABLE and he should be transferred to Oncology service with Hospitalist assisting if prn  Left groin wound postop infection-completed Augmentin-vascular surgery following peripherally Wounds drain a little--probable seroma--will ask VVS to follow up 7/24 am  Venous thrombosis right subclavian veins-continue Arixtra as per oncology  Malignancy rated pain-steroids, fentanyl patch, gabapentin  With right foot gangrene-no aggressive approach await autoamputation  Anxiety/bipolar-Remeron, Marinol, lorazepam, Lexapro-considering Cymbalta  Impaired glycemic control-sugars are not above 180-A1c is probably less than 6.2-monitor  Gangrene of right foot-status post intervention awaiting autoamputation-wounds currently looks stable  Probable malignancy mediated anemia versus blood loss anemia-monitor trends-transfuse if less than hemoglobin of 7   -Unclear at this  time-needs access and possible chemo  DVT prophylaxis: Lovenox code Status: Full code family Communication: none today disposition Plan: Inpatient   Tony Villafranca, MD  [facility] Direct contact: [phone] --Via amion app OR  --[email or web address]; password [email or web address]  7PM-7AM contact night coverage as above 01/01/2018, 2:28 PM  LOS: 15 days   Consults  : Oncology/vascular surgery/palliative care/ interventional  radiology  Procedures:  7/9 US guided right neck mass biopsy. Mx 18g core biopsy.  Antimicrobials:  Unaysn 7/11 >> 7/13  Augmentin 7/13 >>  Zosyn 7/8 >> 7/10  Vancomycin 7/8 >> 7/10    Interval history/Subjective:  Awake alert in nad No new issues other than pain Doesn't like PCA  Objective:  Vitals:  Vitals:   01/01/18 0459 01/01/18 1207  BP: (!) 142/78 (!) 154/74  Pulse: 62 74  Resp: 16 18  Temp: 97.8 F (36.6 C)   SpO2: 97% 96%    Exam:  Awake alert in some distress Swelling visibly less in R traps decreasedright arm swelling and right hemi-torso swelling Chest is clear No icterus no pallor  Abdomen is soft nontender no rebound Back Euthymic Groin wound on the right side slight drainage  I have personally reviewed the following:   Labs:  Hemoglobin down from 8.1, Diff predom Neutrophilia 92  Chem-7 wnl  Imaging studies:  Upper GI small bowel follow-through 7/19  CT abdomen CT chest 7/15-confirmed 2.4 spiculated right upper lobe nodule-?  Third spacing chest abdomen-air-fluid levels abdomen-11 cc volume small fluid collection in right groin?  Hematoma  MRI brain 7/16 cerebral metastatic disease?  Right cervical lymphadenopathy  Medical tests:  XRT started 7/22  Test discussed with performing physician:  no  Decision to obtain old records:  no  Review and summation of old records:  Yes summarized  Scheduled Meds: . acetaminophen  1,000 mg Oral TID  . atorvastatin  40 mg Oral Daily  .  dexamethasone  4 mg Intravenous  TID  . dronabinol  2.5 mg Oral BID AC  . DULoxetine  20 mg Oral Daily  . escitalopram  10 mg Oral Daily  . feeding supplement  1 Container Oral TID BM  . fentaNYL  100 mcg Transdermal Q72H  . fondaparinux (ARIXTRA) injection  7.5 mg Subcutaneous q1800  . gabapentin  400 mg Oral TID  . lidocaine  1 patch Transdermal Q24H  . LORazepam  1 mg Oral TID  . metoprolol succinate  100 mg Oral QHS  . mirtazapine  15 mg Oral QHS  . nutrition supplement (JUVEN)  1 packet Oral BID BM  . ondansetron  4 mg Oral Q6H  . pantoprazole  40 mg Oral Daily  . senna  1 tablet Oral QHS  . sodium chloride flush  3 mL Intravenous Q12H   Continuous Infusions: . sodium chloride    . sodium chloride      Principal Problem:   Malignant neoplasm of upper lobe of right lung (HCC) Active Problems:   S/P aortobifemoral bypass surgery   Lung mass   Postoperative wound infection   Cervical lymphadenopathy   DVT (deep venous thrombosis) (HCC)   PAD (peripheral artery disease) (HCC)   Gangrene of right foot (HCC)   AKI (acute kidney injury) ([place])   Uncontrolled pain   Palliative care encounter   Neck pain due to malignant neoplastic disease ([place])   Palliative care by specialist   Mass in neck   Adenocarcinoma [facility])   Counseling regarding advance care planning and goals of care   LOS: 15 days

## 2018-01-01 NOTE — Progress Notes (Signed)
Tony [name] is more withdrawn on my visit today- but his wife says he had a much better day in general and that the evening is particularly difficult for him. Difficult to know if we are making progress on his pain control, but he is at least getting OOB and thinking about the future. His appetite is better and he tells me he has so many thoughts in his head-maybe slightly confused tonight and diverting more serious questions.  24 hour Opioid Use:  177mcg Fentanyl TD 20mg  Oral Dilaudid 6mg  IV dilaudid push (more frequent dosing later in the afternoon-evening)  The total of his hydromorphone=200 OME=100 TD Fentanyl in addition to what he currently has in place.  Recommendations/Plan:  Will titrate up Cymbalta and wean down [place]  Will increase his fentanyl to 178mcg  Continue IV dilaudid for breakthrough pain- we should begin to see this demand come down or at least stabilize if we are adequately treating his pain. I discussed the concept of emotional and spiritual pain and anxiety and we talked about how that affects his physical pain.  Will contact Dr. Clydell Hakim to see if patient is a candidate for interventional pain modalities  Plans to continue radiation.  Time: 50 min [place]

## 2018-01-02 ENCOUNTER — Ambulatory Visit
Admit: 2018-01-02 | Discharge: 2018-01-02 | Disposition: A | Discharge status: Home / Self Care | Payer: BLUE CROSS/BLUE SHIELD | Attending: Radiation Oncology | Admitting: Radiation Oncology

## 2018-01-02 ENCOUNTER — Telehealth: Payer: Self-pay | Admitting: Hematology

## 2018-01-02 MED ORDER — METHOCARBAMOL 500 MG PO TABS
750.0000 mg | ORAL_TABLET | Freq: Four times a day (QID) | ORAL | Status: AC | PRN
  Administered 2018-01-02: 750 mg via ORAL
  Filled 2018-01-02: qty 2

## 2018-01-02 MED ORDER — METHOCARBAMOL 500 MG PO TABS
750.0000 mg | ORAL_TABLET | Freq: Once | ORAL | Status: AC | PRN
  Administered 2018-01-02: 750 mg via ORAL
  Filled 2018-01-02: qty 2

## 2018-01-02 NOTE — Progress Notes (Signed)
Patient given one time dose of Robaxin at 1136 per orders. Patient had prior received both IV and PO PRN dilaudid doses as well as scheduled morning medications that included Ativan and Gabapentin at 1026. RN entered room at 1300 to prepare patient to go down for radiation treatment and patient was resting. Patient stated that at that moment lying still his pain felt better and that he had been able to get "some good sleep" after the medications. Patient got OOB to the wheelchair and reported that his pain with movement seemed to feel more controlled at this time. Palliative medicine to continue to follow patient for symptom management.   Alizay Bronkema, Fraser Din 01/02/2018 1:06 PM

## 2018-01-02 NOTE — Progress Notes (Signed)
PROGRESS NOTE    Tony [name]  SMO:[id number] DOB: [birth year] DOA: 12/17/2017 PCP: [name], MD   Brief Narrative: Tony [name] is a 52 y.o. male with a history of PAD s/p fem-fem bypass, thrombectomy, post-op wound infection. Patient with new diagnosis of lung mass concerning for poorly differentiated adenocarcinoma of the lung. Patient is undergoing x-ray therapy and medical oncology planning on starting chemotherapy. Hospitalization complicated by poorly controlled pain.   Assessment & Plan:   Principal Problem:   Malignant neoplasm of upper lobe of right lung (HCC) Active Problems:   S/P aortobifemoral bypass surgery   Lung mass   Postoperative wound infection   Cervical lymphadenopathy   DVT (deep venous thrombosis) (HCC)   PAD (peripheral artery disease) (HCC)   Gangrene of right foot (HCC)   AKI (acute kidney injury) ([place])   Uncontrolled pain   Palliative care encounter   Neck pain due to malignant neoplastic disease ([place])   Palliative care by specialist   Mass in neck   Adenocarcinoma [facility])   Counseling regarding advance care planning and goals of care   Right upper lobe nodule Concerning for metastatic adenocarcinoma of the lung. Biopsy consistent with lung vs upper GI source. Upper GI series was negative for evidence of malignancy -Oncology/radiation oncology recommendations  DVT Multiple. Patient on Xarelto as an outpatient. Currently on Arixtra -Per oncology  Right foot gangrene Plan for outpatient management per vascular surgery. No plan for surgical management inpatient.  Acute pain Secondary to malignancy. Significant. Palliative care on board to help with management. -Palliative care medicine recommendations -Continue fentanyl patch, gabapentin, steroids  Anxiety Bipolar disorder -Continue Marinol, ativan, Lexapro  Left groin wound Draining serous fluid. No evidence of infection. Persistent. -Dressing changes  Anemia Normocytic.  Chronic disease. Stable. Hematology recommending to transfuse for hemoglobin less than 8   DVT prophylaxis: Arixtra Code Status:   Code Status: Full Code Family Communication: None at bedside Disposition Plan: Discharge pending better pain control   Consultants:   Palliative care  Vascular surgery  Medical oncology  Procedures:   XRT  Antimicrobials:  Vancomycin (7/8>>7/10)  Zosyn (7/8>> 7/10)  Unasyn (7/11>>7/13)  Augmentin (7/13>>7/21)   Subjective: Patient reports significant pain of neck. Lower extremity not painful.  Objective: Vitals:   01/01/18 1207 01/01/18 2129 01/02/18 0530 01/02/18 1425  BP: (!) 154/74 137/83 (!) 143/77 (!) 168/93  Pulse: 74 67 61 73  Resp: 18 12 10 16   Temp:  98.1 F (36.7 C) 97.8 F (36.6 C) 98.1 F (36.7 C)  TempSrc:  Oral  Oral  SpO2: 96% 97% 96% 96%  Weight:      Height:        Intake/Output Summary (Last 24 hours) at 01/02/2018 1810 Last data filed at 01/02/2018 1036 Gross per 24 hour  Intake -  Output 350 ml  Net -350 ml   Filed Weights   12/18/17 1613 01/01/18 1100  Weight: 71.6 kg (157 lb 13.6 oz) 86.1 kg (189 lb 12.8 oz)    Examination:  General exam: Appears calm and comfortable Respiratory system: Clear to auscultation. Respiratory effort normal. Cardiovascular system: S1 & S2 heard, RRR. No murmurs, rubs, gallops or clicks. Gastrointestinal system: Abdomen is nondistended, soft and nontender. ormal bowel sounds heard. Central nervous system: Alert and oriented. No focal neurological deficits. Extremities: 2+ edema of left leg and right arm. No calf tenderness Skin: No cyanosis. No rashes. Left leg incision with a small hole and serous drainage; no erythema or tenderness.  Right neck with rough skin changes and overlying tenderness without erythema or skin breakdown Psychiatry: Judgement and insight appear normal. Mood & affect appropriate.     Data Reviewed: I have personally reviewed following labs and  imaging studies  CBC: Recent Labs  Lab 12/29/17 0315 01/01/18 0434  WBC 9.4 15.2*  NEUTROABS  --  13.9*  HGB 7.8* 8.1*  HCT 25.5* 26.7*  MCV 94.4 92.7  PLT 435* 885*   Basic Metabolic Panel: Recent Labs  Lab 12/29/17 0315 01/01/18 0435  NA 143 145  K 3.6 3.9  CL 108 109  CO2 25 26  GLUCOSE 124* 164*  BUN 6 14  CREATININE 1.07 0.90  CALCIUM 8.4* 8.5*  PHOS  --  1.8*   GFR: Estimated Creatinine Clearance: 120.9 mL/min (by C-G formula based on SCr of 0.9 mg/dL). Liver Function Tests: Recent Labs  Lab 12/29/17 0315 01/01/18 0435  AST 19  --   ALT 14  --   ALKPHOS 82  --   BILITOT 0.5  --   PROT 5.8*  --   ALBUMIN 2.2* 2.3*   No results for input(s): LIPASE, AMYLASE in the last 168 hours. No results for input(s): AMMONIA in the last 168 hours. Coagulation Profile: No results for input(s): INR, PROTIME in the last 168 hours. Cardiac Enzymes: No results for input(s): CKTOTAL, CKMB, CKMBINDEX, TROPONINI in the last 168 hours. BNP (last 3 results) No results for input(s): PROBNP in the last 8760 hours. HbA1C: No results for input(s): HGBA1C in the last 72 hours. CBG: No results for input(s): GLUCAP in the last 168 hours. Lipid Profile: No results for input(s): CHOL, HDL, LDLCALC, TRIG, CHOLHDL, LDLDIRECT in the last 72 hours. Thyroid Function Tests: No results for input(s): TSH, T4TOTAL, FREET4, T3FREE, THYROIDAB in the last 72 hours. Anemia Panel: No results for input(s): VITAMINB12, FOLATE, FERRITIN, TIBC, IRON, RETICCTPCT in the last 72 hours. Sepsis Labs: No results for input(s): PROCALCITON, LATICACIDVEN in the last 168 hours.  No results found for this or any previous visit (from the past 240 hour(s)).       Radiology Studies: No results found.      Scheduled Meds: . acetaminophen  1,000 mg Oral TID  . atorvastatin  40 mg Oral Daily  . dexamethasone  4 mg Intravenous TID  . dronabinol  2.5 mg Oral BID AC  . DULoxetine  30 mg Oral Daily    . escitalopram  5 mg Oral Daily  . feeding supplement  1 Container Oral TID BM  . fentaNYL  150 mcg Transdermal Q72H  . fondaparinux (ARIXTRA) injection  7.5 mg Subcutaneous q1800  . gabapentin  400 mg Oral TID  . lidocaine  1 patch Transdermal Q24H  . LORazepam  1 mg Oral TID  . metoprolol succinate  100 mg Oral QHS  . mirtazapine  15 mg Oral QHS  . nutrition supplement (JUVEN)  1 packet Oral BID BM  . ondansetron  4 mg Oral Q6H  . pantoprazole  40 mg Oral Daily  . senna  1 tablet Oral QHS  . sodium chloride flush  3 mL Intravenous Q12H   Continuous Infusions: . sodium chloride    . sodium chloride       LOS: 16 days     Cordelia Poche, MD [facility] 01/02/2018, 6:10 PM Pager: [phone]  If 7PM-7AM, please contact night-coverage [email or web address] 01/02/2018, 6:10 PM

## 2018-01-02 NOTE — Progress Notes (Signed)
Daily Progress Note   Patient Name: Tony [name]       Date: 01/02/2018 DOB: Nov 08, 1965  Age: 52 y.o. MRN#: [id number] Attending Physician: Mariel Aloe, MD Primary Care Physician: [name], MD Admit Date: 12/17/2017  Reason for Consultation/Follow-up: Pain control, psychosocial support  Subjective: Tony [name] is sitting on side of bed rubbing right neck in pain.   Length of Stay: 16  Current Medications: Scheduled Meds:  . acetaminophen  1,000 mg Oral TID  . atorvastatin  40 mg Oral Daily  . dexamethasone  4 mg Intravenous TID  . dronabinol  2.5 mg Oral BID AC  . DULoxetine  30 mg Oral Daily  . escitalopram  5 mg Oral Daily  . feeding supplement  1 Container Oral TID BM  . fentaNYL  150 mcg Transdermal Q72H  . fondaparinux (ARIXTRA) injection  7.5 mg Subcutaneous q1800  . gabapentin  400 mg Oral TID  . lidocaine  1 patch Transdermal Q24H  . LORazepam  1 mg Oral TID  . metoprolol succinate  100 mg Oral QHS  . mirtazapine  15 mg Oral QHS  . nutrition supplement (JUVEN)  1 packet Oral BID BM  . ondansetron  4 mg Oral Q6H  . pantoprazole  40 mg Oral Daily  . senna  1 tablet Oral QHS  . sodium chloride flush  3 mL Intravenous Q12H    Continuous Infusions: . sodium chloride    . sodium chloride      PRN Meds: sodium chloride, sodium chloride, alum & mag hydroxide-simeth, bisacodyl, guaiFENesin-dextromethorphan, hydrALAZINE, HYDROmorphone (DILAUDID) injection, HYDROmorphone, labetalol, LORazepam, methocarbamol, naLOXone (NARCAN)  injection, phenol, polyethylene glycol, promethazine, sodium chloride flush  Physical Exam  Constitutional: He is oriented to person, place, and time. He appears well-developed.  HENT:  Head: Atraumatic.  Cardiovascular: Normal rate.    Pulmonary/Chest: Effort normal. No accessory muscle usage. No tachypnea. No respiratory distress.  Abdominal: Normal appearance.  Neurological: He is alert and oriented to person, place, and time.  Nursing note and vitals reviewed.           Vital Signs: BP (!) 143/77 (BP Location: Left Arm)   Pulse 61   Temp 97.8 F (36.6 C)   Resp 10   Ht 6\' 5"  (1.956 m)   Wt 86.1 kg (189 lb 12.8  oz)   SpO2 96%   BMI 22.51 kg/m  SpO2: SpO2: 96 % O2 Device: O2 Device: Room Air O2 Flow Rate: O2 Flow Rate (L/min): 2 L/min  Intake/output summary:   Intake/Output Summary (Last 24 hours) at 01/02/2018 1043 Last data filed at 01/01/2018 1730 Gross per 24 hour  Intake 240 ml  Output 350 ml  Net -110 ml   LBM: Last BM Date: 01/01/18 Baseline Weight: Weight: 71.6 kg (157 lb 13.6 oz) Most recent weight: Weight: 86.1 kg (189 lb 12.8 oz)       Palliative Assessment/Data: 50%   Flowsheet Rows     Most Recent Value  Intake Tab  Referral Department  Surgery  Palliative Care Primary Diagnosis  Cancer  Date Notified  12/25/17  Palliative Care Type  New Palliative care  Reason for referral  Clarify Goals of Care, Non-pain Symptom  Date of Admission  12/17/17  Date first seen by Palliative Care  12/26/17  # of days Palliative referral response time  1 Day(s)  # of days IP prior to Palliative referral  8  Clinical Assessment  Psychosocial & Spiritual Assessment  Palliative Care Outcomes      Patient Active Problem List   Diagnosis Date Noted  . Counseling regarding advance care planning and goals of care 01/01/2018  . Adenocarcinoma ([place])   . SVC syndrome 12/31/2017  . Adenocarcinoma of left lung, stage 4 ([place]) 12/31/2017  . Mass in neck   . Neck pain due to malignant neoplastic disease ([place])   . Palliative care by specialist   . Uncontrolled pain   . Palliative care encounter   . Malignant neoplasm of upper lobe of right lung ([place]) 12/25/2017  . Gangrene of right foot ([place])  12/20/2017  . AKI (acute kidney injury) ([place]) 12/20/2017  . PAD (peripheral artery disease) ([place]) 12/19/2017  . S/P aortobifemoral bypass surgery 12/17/2017  . Lung mass 12/17/2017  . Postoperative wound infection 12/17/2017  . Weight loss 12/17/2017  . Cervical lymphadenopathy 12/17/2017  . DVT (deep venous thrombosis) ([place]) 12/17/2017  . Aortoiliac occlusive disease ([place]) 11/15/2017  . Pain of right lower extremity due to ischemia 11/13/2017  . Hyperlipidemia LDL goal <70 11/13/2017  . PVD (peripheral vascular disease) ([place]) 11/11/2017  . H. pylori infection 10/11/2011  . Duodenal ulcer 08/23/2011  . IBS (irritable bowel syndrome) 08/18/2011  . Depression with anxiety 05/11/2009  . EPIGASTRIC PAIN, CHRONIC 05/11/2009  . SMOKER 09/01/2008  . INSOMNIA, CHRONIC 09/01/2008  . Essential hypertension 09/01/2008  . PALPITATIONS, CHRONIC 09/01/2008  . GASTRITIS 10/24/2007    Palliative Care Assessment & Plan   HPI: 52 y.o. male  with past medical history of HTN, HLD, arterial occlusive disease, PVD, IBS,  chronic back pain, anxiety, depression admitted on 12/17/2017 with increased drainage from incision sites and with critical limb ischemia of left lower extremity. S/P  Right Aortobifemoral bypass and fem-pop bypass 6/6 and found to thrombectomy with fem-pop bypass on left 6/22. Hospitalization complicated by uncontrolled pain likely s/t newly diagnosed metastatic adenocarcinoma with significant lymphadenopathy to neck. Also with venous thrombus of right subclavian, brachiocephalic, and IJ veins.   Assessment: Tony [name] is sitting on side of bed. He tells me he is happy to see me and very appreciative of the medical team and the support of the palliative team members. He continues to have severe pain in right neck although swelling has decreased since I last saw him Thursday. We discussed no changes to medications as Fentanyl patch just up  titrated last night. Discussed potential interventional  pain consult. Also discussed trying a muscle relaxer as this is the only other pain method we have not attempted.   Spent the rest of the visit providing emotional support. He tells me that he is doing okay. He spends time thinking over to make sure he has provided his family and taught his children everything right. He reflects on past trials in his life and we focused on the good and how he was able to overcome. He speaks of his wife, Glenard Haring, and they have been together 30 years. He admits that he is glad that it is him in this position and not her because he wouldn't know what to do without her (I reminded him that she probably thinks the same thing about him). We also spent time discussing how bad things happen to good people and that he is not to blame for his disease. Emotional support provided.   Recommendations/Plan:  Poor intake: Boost/Breeze TID. Remeron + marinol (would consider d/c marinol). Improved.   Bowel Regimen: Senokot qhs.   Anxiety: Ativan 1 mg TID + q8h prn. Also encouraged him to try and get outside and get some fresh air. I believe he is stable to go off monitor with family member. Discussed with RN and Dr. Lonny Prude.   Right neck pain (r/t cancer):  ? Ice pack 15 min every 4 hours.  ? Tylenol 1000 mg po TID.  ? Dilaudid po 10 mg every 2 hours prn severe pain.  ? Dilaudid IV 1 mg every 30 min prn breakthrough pain.  ? Gabapentin 400 TID.  ? Lidoderm patch to right neck.  ? Fentanyl patch titrated up to 150 mcg/hr just last night - no changes today. ? Cymbalta 30 mg daily. ? Decadron 4 mg IV TID.  ? Added trial Robaxin 750 mg po x 1.  ? Addendum: RN reports back good relief from Robaxin so will order 750 mg po every 6 hours prn! ? Call placed to Dr. Clydell Hakim for evaluation for potential nerve block or interventional pain management by Dr. Rhea Pink.    Code Status:  Full code  Prognosis:   Overall poor prognosis with stage IV cancer newly diagnosed and  gangrene to right foot. Attempt to better manage pain/symptoms to see if functional status may improve.   Discharge Planning:  To Be Determined  Care plan was discussed with patient, family, RN, Dr. Denton Brick. Also discussed pain management plans with Dr. Domingo Cocking.   Thank you for allowing the Palliative Medicine Team to assist in the care of this patient.   Total Time 60 min Prolonged Time Billed  no      Greater than 50%  of this time was spent counseling and coordinating care related to the above assessment and plan.  Vinie Sill, NP Palliative Medicine Team Pager # [phone] (M-F 8a-5p) Team Phone # [phone] (Nights/Weekends)

## 2018-01-02 NOTE — Consult Note (Signed)
[facility] Nurse wound consult note Reason for Consult: Gangrenous changes to right toes.  Edges are erythematous and demarcated.   Left groin bypass graft site is healing with 0.2 cm open area that leaks serous fluid.  Right neck pain with erythema and induration related to malignancy.  Wound type: Vascular/surgical/malignant Pressure Injury POA: NA Measurement: Left groin:  5 cm linear abrasion with 0.2 opening.  Wound [id number] to visualize Drainage (amount, consistency, odor) minimal serous from left groin Periwound: Right foot with gangrenous changes.  Dressing procedure/placement/frequency: Cleanse necrotic toes on right foot with soap and water and paint with betadine daily.  Dry dressing to left groin wound daily and PRN soilage.  Will not follow at this time.  Please re-consult if needed.  Domenic Moras RN BSN [place] Pager [phone]

## 2018-01-02 NOTE — Telephone Encounter (Signed)
Lft the pt a vm to call back to schedule a hospital follow up with Dr. Irene Limbo.

## 2018-01-03 ENCOUNTER — Ambulatory Visit
Admit: 2018-01-03 | Discharge: 2018-01-03 | Disposition: A | Discharge status: Home / Self Care | Payer: BLUE CROSS/BLUE SHIELD | Attending: Radiation Oncology | Admitting: Radiation Oncology

## 2018-01-03 LAB — BASIC METABOLIC PANEL
Anion gap: 10 (ref 5–15)
BUN: 22 mg/dL — ABNORMAL HIGH (ref 6–20)
CALCIUM: 8.5 mg/dL — AB (ref 8.9–10.3)
CO2: 26 mmol/L (ref 22–32)
CREATININE: 0.79 mg/dL (ref 0.61–1.24)
Chloride: 107 mmol/L (ref 98–111)
Glucose, Bld: 141 mg/dL — ABNORMAL HIGH (ref 70–99)
Potassium: 4.2 mmol/L (ref 3.5–5.1)
SODIUM: 143 mmol/L (ref 135–145)

## 2018-01-03 LAB — CBC
HCT: 27.8 % — ABNORMAL LOW (ref 39.0–52.0)
Hemoglobin: 8.4 g/dL — ABNORMAL LOW (ref 13.0–17.0)
MCH: 28.3 pg (ref 26.0–34.0)
MCHC: 30.2 g/dL (ref 30.0–36.0)
MCV: 93.6 fL (ref 78.0–100.0)
PLATELETS: 497 10*3/uL — AB (ref 150–400)
RBC: 2.97 MIL/uL — AB (ref 4.22–5.81)
RDW: 14.5 % (ref 11.5–15.5)
WBC: 14.8 10*3/uL — ABNORMAL HIGH (ref 4.0–10.5)

## 2018-01-03 LAB — PHOSPHORUS: PHOSPHORUS: 2.7 mg/dL (ref 2.5–4.6)

## 2018-01-03 MED ORDER — METHOCARBAMOL 500 MG PO TABS
750.0000 mg | ORAL_TABLET | Freq: Four times a day (QID) | ORAL | Status: DC | PRN
  Administered 2018-01-03: 750 mg via ORAL
  Filled 2018-01-03: qty 2

## 2018-01-03 MED ORDER — IRBESARTAN 150 MG PO TABS
150.0000 mg | ORAL_TABLET | Freq: Every day | ORAL | Status: DC
  Administered 2018-01-03 – 2018-01-23 (×21): 150 mg via ORAL
  Filled 2018-01-03 (×21): qty 1

## 2018-01-03 MED ORDER — HYDROMORPHONE HCL 1 MG/ML IJ SOLN
1.0000 mg | INTRAMUSCULAR | Status: DC | PRN
Start: 2018-01-03 — End: 2018-01-07
  Administered 2018-01-03: 2 mg via INTRAVENOUS
  Administered 2018-01-03: 1 mg via INTRAVENOUS
  Administered 2018-01-03 – 2018-01-05 (×14): 2 mg via INTRAVENOUS
  Administered 2018-01-05: 1 mg via INTRAVENOUS
  Administered 2018-01-05 (×2): 2 mg via INTRAVENOUS
  Administered 2018-01-05 (×2): 1 mg via INTRAVENOUS
  Administered 2018-01-05 – 2018-01-07 (×20): 2 mg via INTRAVENOUS
  Filled 2018-01-03 (×10): qty 2
  Filled 2018-01-03: qty 1
  Filled 2018-01-03 (×2): qty 2
  Filled 2018-01-03: qty 1
  Filled 2018-01-03 (×2): qty 2
  Filled 2018-01-03: qty 1
  Filled 2018-01-03 (×13): qty 2
  Filled 2018-01-03: qty 1
  Filled 2018-01-03 (×3): qty 2
  Filled 2018-01-03: qty 1
  Filled 2018-01-03 (×7): qty 2

## 2018-01-03 MED ORDER — METHOCARBAMOL 500 MG PO TABS
1000.0000 mg | ORAL_TABLET | Freq: Four times a day (QID) | ORAL | Status: DC
  Administered 2018-01-03 – 2018-01-08 (×20): 1000 mg via ORAL
  Filled 2018-01-03 (×20): qty 2

## 2018-01-03 MED ORDER — FENTANYL 100 MCG/HR TD PT72
200.0000 ug | MEDICATED_PATCH | TRANSDERMAL | Status: DC
  Administered 2018-01-03: 200 ug via TRANSDERMAL
  Filled 2018-01-03 (×2): qty 2

## 2018-01-03 MED ORDER — HYDROMORPHONE HCL 1 MG/ML IJ SOLN
2.0000 mg | Freq: Once | INTRAMUSCULAR | Status: AC
  Administered 2018-01-03: 2 mg via INTRAVENOUS
  Filled 2018-01-03: qty 2

## 2018-01-03 NOTE — Progress Notes (Signed)
[facility] Therapy Treatment Record Phone [phone]   Radiation Therapy was administered to Tony [name] on: 01/03/2018  2:13 PM and was treatment # 4 out of a planned course of 8 treatments.  Radiation Treatment  1). Beam photons with 6-10 energy  2). Brachytherapy None  3). Stereotactic Radiosurgery None  4). Other Radiation None     Tony [name] [name], RT

## 2018-01-03 NOTE — Progress Notes (Signed)
Daily Progress Note   Patient Name: Tony [name]       Date: 01/03/2018 DOB: 04/08/1966  Age: 52 y.o. MRN#: [id number] Attending Physician: Mariel Aloe, MD Primary Care Physician: [name], MD Admit Date: 12/17/2017  Reason for Consultation/Follow-up: Pain control, psychosocial support  Subjective: Tony [name] sitting in bed. Continues to have severe right neck pain. Disappointed as he felt much improved yesterday and thought that the pain would continue to stay tolerable.   Length of Stay: 17  Current Medications: Scheduled Meds:  . acetaminophen  1,000 mg Oral TID  . atorvastatin  40 mg Oral Daily  . dexamethasone  4 mg Intravenous TID  . dronabinol  2.5 mg Oral BID AC  . DULoxetine  30 mg Oral Daily  . escitalopram  5 mg Oral Daily  . feeding supplement  1 Container Oral TID BM  . fentaNYL  150 mcg Transdermal Q72H  . fondaparinux (ARIXTRA) injection  7.5 mg Subcutaneous q1800  . gabapentin  400 mg Oral TID  . irbesartan  150 mg Oral Daily  . lidocaine  1 patch Transdermal Q24H  . LORazepam  1 mg Oral TID  . methocarbamol  1,000 mg Oral QID  . metoprolol succinate  100 mg Oral QHS  . mirtazapine  15 mg Oral QHS  . nutrition supplement (JUVEN)  1 packet Oral BID BM  . ondansetron  4 mg Oral Q6H  . pantoprazole  40 mg Oral Daily  . senna  1 tablet Oral QHS  . sodium chloride flush  3 mL Intravenous Q12H    Continuous Infusions: . sodium chloride    . sodium chloride      PRN Meds: sodium chloride, sodium chloride, alum & mag hydroxide-simeth, bisacodyl, guaiFENesin-dextromethorphan, hydrALAZINE, HYDROmorphone (DILAUDID) injection, HYDROmorphone, labetalol, LORazepam, naLOXone (NARCAN)  injection, phenol, polyethylene glycol, promethazine, sodium chloride  flush  Physical Exam  Constitutional: He is oriented to person, place, and time. He appears well-developed.  HENT:  Head: Atraumatic.  Cardiovascular: Normal rate.  Pulmonary/Chest: Effort normal. No accessory muscle usage. No tachypnea. No respiratory distress.  Abdominal: Normal appearance.  Neurological: He is alert and oriented to person, place, and time.  Nursing note and vitals reviewed.           Vital Signs: BP (!) 167/80 (BP Location: Left Arm)  Pulse 60   Temp 98.1 F (36.7 C) (Oral)   Resp 12   Ht 6' 5" (1.956 m)   Wt 86.1 kg (189 lb 12.8 oz)   SpO2 96%   BMI 22.51 kg/m  SpO2: SpO2: 96 % O2 Device: O2 Device: Room Air O2 Flow Rate: O2 Flow Rate (L/min): 2 L/min  Intake/output summary:   Intake/Output Summary (Last 24 hours) at 01/03/2018 1126 Last data filed at 01/03/2018 0734 Gross per 24 hour  Intake 240 ml  Output 925 ml  Net -685 ml   LBM: Last BM Date: 01/02/18 Baseline Weight: Weight: 71.6 kg (157 lb 13.6 oz) Most recent weight: Weight: 86.1 kg (189 lb 12.8 oz)       Palliative Assessment/Data: 50%   Flowsheet Rows     Most Recent Value  Intake Tab  Referral Department  Surgery  Palliative Care Primary Diagnosis  Cancer  Date Notified  12/25/17  Palliative Care Type  New Palliative care  Reason for referral  Clarify Goals of Care, Non-pain Symptom  Date of Admission  12/17/17  Date first seen by Palliative Care  12/26/17  # of days Palliative referral response time  1 Day(s)  # of days IP prior to Palliative referral  8  Clinical Assessment  Psychosocial & Spiritual Assessment  Palliative Care Outcomes      Patient Active Problem List   Diagnosis Date Noted  . Counseling regarding advance care planning and goals of care 01/01/2018  . Adenocarcinoma ([place])   . SVC syndrome 12/31/2017  . Adenocarcinoma of left lung, stage 4 ([place]) 12/31/2017  . Mass in neck   . Neck pain due to malignant neoplastic disease ([place])   . Palliative care by  specialist   . Uncontrolled pain   . Palliative care encounter   . Malignant neoplasm of upper lobe of right lung ([place]) 12/25/2017  . Gangrene of right foot ([place]) 12/20/2017  . AKI (acute kidney injury) ([place]) 12/20/2017  . PAD (peripheral artery disease) ([place]) 12/19/2017  . S/P aortobifemoral bypass surgery 12/17/2017  . Lung mass 12/17/2017  . Postoperative wound infection 12/17/2017  . Weight loss 12/17/2017  . Cervical lymphadenopathy 12/17/2017  . DVT (deep venous thrombosis) ([place]) 12/17/2017  . Aortoiliac occlusive disease ([place]) 11/15/2017  . Pain of right lower extremity due to ischemia 11/13/2017  . Hyperlipidemia LDL goal <70 11/13/2017  . PVD (peripheral vascular disease) ([place]) 11/11/2017  . H. pylori infection 10/11/2011  . Duodenal ulcer 08/23/2011  . IBS (irritable bowel syndrome) 08/18/2011  . Depression with anxiety 05/11/2009  . EPIGASTRIC PAIN, CHRONIC 05/11/2009  . SMOKER 09/01/2008  . INSOMNIA, CHRONIC 09/01/2008  . Essential hypertension 09/01/2008  . PALPITATIONS, CHRONIC 09/01/2008  . GASTRITIS 10/24/2007    Palliative Care Assessment & Plan   HPI: 52 y.o. male  with past medical history of HTN, HLD, arterial occlusive disease, PVD, IBS,  chronic back pain, anxiety, depression admitted on 12/17/2017 with increased drainage from incision sites and with critical limb ischemia of left lower extremity. S/P  Right Aortobifemoral bypass and fem-pop bypass 6/6 and found to thrombectomy with fem-pop bypass on left 6/22. Hospitalization complicated by uncontrolled pain likely s/t newly diagnosed metastatic adenocarcinoma with significant lymphadenopathy to neck. Also with venous thrombus of right subclavian, brachiocephalic, and IJ veins.   Assessment: I met again today with Tony [name]. His daughter sleeps at bedside throughout our conversation. Tony [name] tells me this morning that he is scared. He is scared that this pain is going to  continue and will not improve and he is scared  of having to live this way. I told him that I am not giving up on getting better control of this pain and we will continue to work on this together. Acknowledged his fear and frustration. We will increase IV dilaudid for improved immediate relief, schedule Robaxin, and increase fentanyl patch.   I spent some time talking with Tony [name] which seemed to be a good distraction from his pain. We spoke of his enjoyment of video games and shared television shows that we have watched. Therapeutic listening, breathing exercises, and emotional support provided.   I will see again tomorrow.   Recommendations/Plan:  Poor intake: Boost/Breeze TID. Remeron + marinol (would consider d/c marinol). Improved.   Bowel Regimen: Senokot qhs. Having daily BM.   Anxiety: Ativan 1 mg TID + q8h prn. Also encouraged him to try and get outside and get some fresh air. I believe he is stable to go off monitor with family member. Discussed with RN and Dr. Lonny Prude.   Right neck pain (r/t cancer):  ? Ice pack 15 min every 4 hours.  ? Tylenol 1000 mg po TID.  ? Dilaudid po 10 mg every 2 hours prn severe pain.  ? Dilaudid IV 1 mg every 30 min prn breakthrough pain.  ? Gabapentin 400 TID.  ? Lidoderm patch to right neck.  ? Fentanyl patch titrated up to 200 mcg/hr today. ? Cymbalta 30 mg daily. ? Decadron 4 mg IV TID.  ? Robaxin 1000 mg QID ? Call placed to Dr. Clydell Hakim for evaluation for potential nerve block or interventional pain management by Dr. Rhea Pink. I have contacted Dr. Maryjean Ka this morning and awaiting return call.    Code Status:  Full code  Prognosis:   Overall poor prognosis with stage IV cancer newly diagnosed and gangrene to right foot. Attempt to better manage pain/symptoms to see if functional status may improve.   Discharge Planning:  To Be Determined  Care plan was discussed with patient, RN.  Thank you for allowing the Palliative Medicine Team to assist in the care of this  patient.   Total Time 60 min Prolonged Time Billed  no      Greater than 50%  of this time was spent counseling and coordinating care related to the above assessment and plan.  Vinie Sill, NP Palliative Medicine Team Pager # [phone] (M-F 8a-5p) Team Phone # [phone] (Nights/Weekends)

## 2018-01-03 NOTE — Progress Notes (Signed)
PROGRESS NOTE    Tony [name]  GYK:[id number] DOB: 03/04/1966 DOA: 12/17/2017 PCP: [name], MD   Brief Narrative: Tony [name] is a 52 y.o. male with a history of PAD s/p fem-fem bypass, thrombectomy, post-op wound infection. Patient with new diagnosis of lung mass concerning for poorly differentiated adenocarcinoma of the lung. Patient is undergoing x-ray therapy and medical oncology planning on starting chemotherapy. Hospitalization complicated by poorly controlled pain.   Assessment & Plan:   Principal Problem:   Malignant neoplasm of upper lobe of right lung (HCC) Active Problems:   S/P aortobifemoral bypass surgery   Lung mass   Postoperative wound infection   Cervical lymphadenopathy   DVT (deep venous thrombosis) (HCC)   PAD (peripheral artery disease) (HCC)   Gangrene of right foot (HCC)   AKI (acute kidney injury) ([place])   Uncontrolled pain   Palliative care encounter   Neck pain due to malignant neoplastic disease ([place])   Palliative care by specialist   Mass in neck   Adenocarcinoma [facility])   Counseling regarding advance care planning and goals of care   Right upper lobe nodule Concerning for metastatic adenocarcinoma of the lung. Biopsy consistent with lung vs upper GI source. Upper GI series was negative for evidence of malignancy -Oncology/radiation oncology recommendations  DVT Multiple. Patient on Xarelto as an outpatient. Currently on Arixtra -Per oncology  Right foot gangrene Plan for outpatient management per vascular surgery. No plan for surgical management inpatient.  Acute pain Secondary to malignancy. Significant. Palliative care on board to help with management. -Palliative care medicine recommendations -Continue fentanyl patch, gabapentin, steroids  Anxiety Bipolar disorder -Continue Marinol, ativan, Lexapro  Left groin wound Left leg wound -Continue dressing changes  Anemia Normocytic. Chronic disease. Stable. Hematology  recommending to transfuse for hemoglobin less than 8   DVT prophylaxis: Arixtra Code Status:   Code Status: Full Code Family Communication: None at bedside Disposition Plan: Discharge pending better pain control   Consultants:   Palliative care  Vascular surgery  Medical oncology  Procedures:   XRT  Antimicrobials:  Vancomycin (7/8>>7/10)  Zosyn (7/8>> 7/10)  Unasyn (7/11>>7/13)  Augmentin (7/13>>7/21)   Subjective: Significant neck pain worsened overnight. Otherwise, no other complaints.  Objective: Vitals:   01/02/18 1425 01/02/18 1815 01/02/18 2105 01/03/18 0442  BP: (!) 168/93 (!) 156/92 (!) 160/96 (!) 167/80  Pulse: 73 64 66 60  Resp: 16  12 12   Temp: 98.1 F (36.7 C)  98.2 F (36.8 C) 98.1 F (36.7 C)  TempSrc: Oral  Oral Oral  SpO2: 96%  99% 96%  Weight:      Height:        Intake/Output Summary (Last 24 hours) at 01/03/2018 1229 Last data filed at 01/03/2018 0734 Gross per 24 hour  Intake 240 ml  Output 925 ml  Net -685 ml   Filed Weights   12/18/17 1613 01/01/18 1100  Weight: 71.6 kg (157 lb 13.6 oz) 86.1 kg (189 lb 12.8 oz)    Examination:  General exam: Appears calm and comfortable Respiratory system: Clear to auscultation. Respiratory effort normal. Cardiovascular system: S1 & S2 heard, RRR. No murmurs. Gastrointestinal system: Abdomen is nondistended, soft and nontender. Normal bowel sounds heard. Central nervous system: Alert and oriented. No focal neurological deficits. Extremities: trace LE edema with swelling of right arm. No calf tenderness Skin: No cyanosis. Left leg incision with no erythema. Overlying dressing is soaked in serous fluid. Psychiatry: Judgement and insight appear normal. Mood & affect appropriate.  Data Reviewed: I have personally reviewed following labs and imaging studies  CBC: Recent Labs  Lab 12/29/17 0315 01/01/18 0434 01/03/18 0451  WBC 9.4 15.2* 14.8*  NEUTROABS  --  13.9*  --   HGB 7.8*  8.1* 8.4*  HCT 25.5* 26.7* 27.8*  MCV 94.4 92.7 93.6  PLT 435* 495* 892*   Basic Metabolic Panel: Recent Labs  Lab 12/29/17 0315 01/01/18 0435 01/03/18 0451  NA 143 145 143  K 3.6 3.9 4.2  CL 108 109 107  CO2 25 26 26   GLUCOSE 124* 164* 141*  BUN 6 14 22*  CREATININE 1.07 0.90 0.79  CALCIUM 8.4* 8.5* 8.5*  PHOS  --  1.8* 2.7   GFR: Estimated Creatinine Clearance: 136 mL/min (by C-G formula based on SCr of 0.79 mg/dL). Liver Function Tests: Recent Labs  Lab 12/29/17 0315 01/01/18 0435  AST 19  --   ALT 14  --   ALKPHOS 82  --   BILITOT 0.5  --   PROT 5.8*  --   ALBUMIN 2.2* 2.3*   No results for input(s): LIPASE, AMYLASE in the last 168 hours. No results for input(s): AMMONIA in the last 168 hours. Coagulation Profile: No results for input(s): INR, PROTIME in the last 168 hours. Cardiac Enzymes: No results for input(s): CKTOTAL, CKMB, CKMBINDEX, TROPONINI in the last 168 hours. BNP (last 3 results) No results for input(s): PROBNP in the last 8760 hours. HbA1C: No results for input(s): HGBA1C in the last 72 hours. CBG: No results for input(s): GLUCAP in the last 168 hours. Lipid Profile: No results for input(s): CHOL, HDL, LDLCALC, TRIG, CHOLHDL, LDLDIRECT in the last 72 hours. Thyroid Function Tests: No results for input(s): TSH, T4TOTAL, FREET4, T3FREE, THYROIDAB in the last 72 hours. Anemia Panel: No results for input(s): VITAMINB12, FOLATE, FERRITIN, TIBC, IRON, RETICCTPCT in the last 72 hours. Sepsis Labs: No results for input(s): PROCALCITON, LATICACIDVEN in the last 168 hours.  No results found for this or any previous visit (from the past 240 hour(s)).       Radiology Studies: No results found.      Scheduled Meds: . acetaminophen  1,000 mg Oral TID  . atorvastatin  40 mg Oral Daily  . dexamethasone  4 mg Intravenous TID  . dronabinol  2.5 mg Oral BID AC  . DULoxetine  30 mg Oral Daily  . escitalopram  5 mg Oral Daily  . feeding  supplement  1 Container Oral TID BM  . fentaNYL  150 mcg Transdermal Q72H  . fondaparinux (ARIXTRA) injection  7.5 mg Subcutaneous q1800  . gabapentin  400 mg Oral TID  . irbesartan  150 mg Oral Daily  . lidocaine  1 patch Transdermal Q24H  . LORazepam  1 mg Oral TID  . methocarbamol  1,000 mg Oral QID  . metoprolol succinate  100 mg Oral QHS  . mirtazapine  15 mg Oral QHS  . nutrition supplement (JUVEN)  1 packet Oral BID BM  . ondansetron  4 mg Oral Q6H  . pantoprazole  40 mg Oral Daily  . senna  1 tablet Oral QHS  . sodium chloride flush  3 mL Intravenous Q12H   Continuous Infusions: . sodium chloride    . sodium chloride       LOS: 17 days     Cordelia Poche, MD [facility] 01/03/2018, 12:29 PM Pager: [phone]  If 7PM-7AM, please contact night-coverage [email or web address] 01/03/2018, 12:29 PM

## 2018-01-03 NOTE — Progress Notes (Signed)
Physical Therapy Treatment Patient Details Name: Tony [name] MRN: [id number] DOB: 07-21-1965 Today's Date: 01/03/2018    History of Present Illness 52 year old man PMH critical limb ischemia, status post aortobifemoral bypass, right femoral artery to popliteal bypass, status post thrombectomy left limb aVF bypass.  Discharged on Xarelto.  Presented 7/8 with drainage from left groin wound and left below-knee incision.  Admitted by vascular surgery and started on IV antibiotics.  Because of neck pain, further imaging was obtained which revealed spiculated lung mass concerning for malignancy, patient transferred to hospitalist service. Pt found to have metastatic adenocarcinoma.     PT Comments    Patient received in bed, pleasant but reporting ongoing high levels of pain today. He is able to perform bed mobility on a Mod(I) basis with increased time, and required general S for all other functional transfers and gait with RW today. Able to ambulate approximately 412f with RW, no dizziness or unsteadiness, able to maintain constant conversation with PT but fatigued at end of gait distance. Recommend possible trial of SPC next session. He was left in bed with all needs met, RN aware of patient status.     Follow Up Recommendations  Home health PT;Supervision - Intermittent     Equipment Recommendations  None recommended by PT    Recommendations for Other Services       Precautions / Restrictions Precautions Precautions: Fall Precaution Comments: R neck and UE edema, gangrenous R toes  Restrictions Weight Bearing Restrictions: No    Mobility  Bed Mobility Overal bed mobility: Modified Independent       Supine to sit: Modified independent (Device/Increase time) Sit to supine: Modified independent (Device/Increase time)   General bed mobility comments: increased time   Transfers Overall transfer level: Needs assistance Equipment used: Rolling walker (2 wheeled) Transfers:  Sit to/from Stand Sit to Stand: Supervision         General transfer comment: general S for safety  Ambulation/Gait Ambulation/Gait assistance: Supervision Gait Distance (Feet): 440 Feet Assistive device: Rolling walker (2 wheeled) Gait Pattern/deviations: Step-through pattern;Decreased stride length;Trunk flexed     General Gait Details: able to maintain continuous gait with conversation with PT, no dizziness or unsteadiness noted today, fatigued at EOS    Stairs             Wheelchair Mobility    Modified Rankin (Stroke Patients Only)       Balance Overall balance assessment: Needs assistance Sitting-balance support: No upper extremity supported;Feet supported Sitting balance-Leahy Scale: Normal     Standing balance support: During functional activity Standing balance-Leahy Scale: Fair Standing balance comment: continues to rely on B UE support                             Cognition Arousal/Alertness: Awake/alert Behavior During Therapy: WFL for tasks assessed/performed Overall Cognitive Status: Within Functional Limits for tasks assessed                                        Exercises      General Comments        Pertinent Vitals/Pain Pain Assessment: 0-10 Pain Score: 8  Pain Location: R neck and UE  Pain Descriptors / Indicators: Discomfort;Throbbing Pain Intervention(s): Limited activity within patient's tolerance;Premedicated before session;Monitored during session    Home Living  Prior Function            PT Goals (current goals can now be found in the care plan section) Acute Rehab PT Goals Patient Stated Goal: have pain under control before return home PT Goal Formulation: With patient Time For Goal Achievement: 01/09/18 Potential to Achieve Goals: Good Progress towards PT goals: Progressing toward goals    Frequency    Min 3X/week      PT Plan Current plan remains  appropriate    Co-evaluation              AM-PAC PT "6 Clicks" Daily Activity  Outcome Measure  Difficulty turning over in bed (including adjusting bedclothes, sheets and blankets)?: None Difficulty moving from lying on back to sitting on the side of the bed? : None Difficulty sitting down on and standing up from a chair with arms (e.g., wheelchair, bedside commode, etc,.)?: None Help needed moving to and from a bed to chair (including a wheelchair)?: A Little Help needed walking in hospital room?: A Little Help needed climbing 3-5 steps with a railing? : A Little 6 Click Score: 21    End of Session Equipment Utilized During Treatment: Gait belt Activity Tolerance: Patient tolerated treatment well Patient left: in bed;with call bell/phone within reach;with family/visitor present Nurse Communication: Mobility status PT Visit Diagnosis: Other abnormalities of gait and mobility (R26.89);Pain Pain - Right/Left: Right Pain - part of body: (R UE and neck )     Time: 0658-2608 PT Time Calculation (min) (ACUTE ONLY): 13 min  Charges:  $Gait Training: 8-22 mins                    G Codes:       Deniece Ree PT, DPT, CBIS  Supplemental Physical Therapist [place]   Pager [phone]

## 2018-01-03 NOTE — Progress Notes (Signed)
Patient's pain is very uncontrolled this morning. Patient given multiple doses of PO and IV dilaudid. Also given PO robaxin. Paged Chaney Malling NP for additional pain control orders. Order received for one time dose of IV dilaudid. Will give per order and continue to monitor patient.

## 2018-01-04 ENCOUNTER — Ambulatory Visit
Admit: 2018-01-04 | Discharge: 2018-01-04 | Disposition: A | Discharge status: Home / Self Care | Payer: BLUE CROSS/BLUE SHIELD | Attending: Radiation Oncology | Admitting: Radiation Oncology

## 2018-01-04 DIAGNOSIS — C3411 Malignant neoplasm of upper lobe, right bronchus or lung: Secondary | ICD-10-CM

## 2018-01-04 MED ORDER — DULOXETINE HCL 20 MG PO CPEP
40.0000 mg | ORAL_CAPSULE | Freq: Every day | ORAL | Status: DC
  Administered 2018-01-05 – 2018-01-08 (×4): 40 mg via ORAL
  Filled 2018-01-04 (×5): qty 2

## 2018-01-04 MED ORDER — HYDROMORPHONE HCL 2 MG PO TABS
10.0000 mg | ORAL_TABLET | Freq: Three times a day (TID) | ORAL | Status: DC
  Administered 2018-01-04 – 2018-01-06 (×5): 10 mg via ORAL
  Filled 2018-01-04 (×6): qty 5

## 2018-01-04 MED ORDER — FENTANYL 100 MCG/HR TD PT72
200.0000 ug | MEDICATED_PATCH | TRANSDERMAL | Status: DC
  Administered 2018-01-04: 200 ug via TRANSDERMAL
  Filled 2018-01-04: qty 2

## 2018-01-04 NOTE — Progress Notes (Signed)
PT Cancellation Note  Patient Details Name: Tony [name] MRN: [id number] DOB: Dec 18, 1965   Cancelled Treatment:    Reason Eval/Treat Not Completed: Pain limiting ability to participate Pt at radiation earlier, then receiving hair cut this afternoon.  Currently upon checking back, pt reports chest pain and foot pain.  RN notified and to bring meds.   Irean Kendricks,KATHrine E 01/04/2018, 2:27 PM Carmelia Bake, PT, DPT 01/04/2018 Pager: [phone]

## 2018-01-04 NOTE — Progress Notes (Signed)
Occupational Therapy Treatment Patient Details Name: Tony [name] MRN: [id number] DOB: [birth year] Today's Date: 01/04/2018    History of present illness 52 year old man PMH critical limb ischemia, status post aortobifemoral bypass, right femoral artery to popliteal bypass, status post thrombectomy left limb aVF bypass.  Discharged on Xarelto.  Presented 7/8 with drainage from left groin wound and left below-knee incision.  Admitted by vascular surgery and started on IV antibiotics.  Because of neck pain, further imaging was obtained which revealed spiculated lung mass concerning for malignancy, patient transferred to hospitalist service. Pt found to have metastatic adenocarcinoma.    OT comments  Pt leaving for radiation  Follow Up Recommendations  Supervision - Intermittent    Equipment Recommendations  None recommended by OT    Recommendations for Other Services      Precautions / Restrictions Precautions Precautions: Fall Precaution Comments: R neck and UE edema, gangrenous R toes  Restrictions Weight Bearing Restrictions: No       Mobility Bed Mobility Overal bed mobility: Modified Independent       Supine to sit: Modified independent (Device/Increase time) Sit to supine: Modified independent (Device/Increase time)   General bed mobility comments: increased time   Transfers Overall transfer level: Needs assistance Equipment used: Rolling walker (2 wheeled) Transfers: Sit to/from Stand Sit to Stand: Supervision         General transfer comment: general S for safety    Balance Overall balance assessment: Needs assistance Sitting-balance support: No upper extremity supported;Feet supported Sitting balance-Leahy Scale: Normal     Standing balance support: During functional activity Standing balance-Leahy Scale: Fair Standing balance comment: continues to rely on B UE support                            ADL either performed or assessed with  clinical judgement   ADL Overall ADL's : Needs assistance/impaired         Upper Body Bathing: Minimal assistance;Standing               Toilet Transfer: Min guard;Comfort height toilet;Stand-pivot;Ambulation   Toileting- Clothing Manipulation and Hygiene: Minimal assistance;Sit to/from stand         General ADL Comments: Noted edema in pts trunk and back. Shared with MD and RN.  Abdominal binder ordered               Cognition Arousal/Alertness: Awake/alert Behavior During Therapy: WFL for tasks assessed/performed Overall Cognitive Status: Within Functional Limits for tasks assessed                                                     Pertinent Vitals/ Pain       Pain Score: 3  Pain Location: R neck and UE  Pain Descriptors / Indicators: Discomfort;Throbbing Pain Intervention(s): Limited activity within patient's tolerance         Frequency  Min 2X/week        Progress Toward Goals  OT Goals(current goals can now be found in the care plan section)  Progress towards OT goals: Progressing toward goals     Plan Discharge plan remains appropriate    Co-evaluation                 AM-PAC PT "6 Clicks" Daily Activity  Outcome Measure   Help from another person eating meals?: None Help from another person taking care of personal grooming?: A Little Help from another person toileting, which includes using toliet, bedpan, or urinal?: A Little Help from another person bathing (including washing, rinsing, drying)?: A Little Help from another person to put on and taking off regular upper body clothing?: A Little Help from another person to put on and taking off regular lower body clothing?: A Little 6 Click Score: 19    End of Session    OT Visit Diagnosis: Muscle weakness (generalized) (M62.81)   Activity Tolerance Patient tolerated treatment well   Patient Left in bed;with call bell/phone within reach;with  family/visitor present   Nurse Communication Mobility status;Other (comment)(trunk edema)        Time: 1314-3888 OT Time Calculation (min): 10 min  Charges: OT General Charges $OT Visit: 1 Visit OT Treatments $Self Care/Home Management : 8-22 mins  [place], [place]   Betsy Pries 01/04/2018, 11:51 AM

## 2018-01-04 NOTE — Progress Notes (Signed)
ONCOLOGY SHORT NOTE  Chart reviewed. Appreciate input from hospitalist, palliative medicine and radiation oncology. Hopefully patient can discharge over the weekend.  We have set him up for medical oncology clinic f/u on 01/08/2018 as outpatient and to start outpatient carbo/taxol weekly from 01/09/2018. If patient cannot be dsicharged will consider start him on carb/taxol as inpatient. Plz call if any questions in the interim.  Sullivan Lone MD MS

## 2018-01-04 NOTE — Progress Notes (Signed)
  Radiation Oncology         [phone] ________________________________  Name: Tony [name] MRN: [id number]  Date: 01/04/2018  DOB: [birth year]  SIMULATION AND TREATMENT PLANNING NOTE    ICD-10-CM   1. Malignant neoplasm of upper lobe of right lung ([place]) C34.11    NARRATIVE:  The patient was brought to the [facility].  Identity was confirmed.  All relevant records and images related to the planned course of therapy were reviewed.   CT images were obtained to better account for upper edge of neck adenopathy and adjust planning.  ________________________________  Sheral Apley. Tammi Klippel, M.D.

## 2018-01-04 NOTE — Progress Notes (Signed)
Daily Progress Note   Patient Name: Tony [name]       Date: 01/04/2018 DOB: [birth year]  Age: 52 y.o. MRN#: [id number] Attending Physician: Mariel Aloe, MD Primary Care Physician: [name], MD Admit Date: 12/17/2017  Reason for Consultation/Follow-up: Pain control, psychosocial support  Subjective: Khiry is surrounded by family this afternoon. Continues to have severe pain that worsens ~2-3 am. Right neck 8/10 currently.   Length of Stay: 18  Current Medications: Scheduled Meds:  . acetaminophen  1,000 mg Oral TID  . atorvastatin  40 mg Oral Daily  . dexamethasone  4 mg Intravenous TID  . dronabinol  2.5 mg Oral BID AC  . [START ON 01/05/2018] DULoxetine  40 mg Oral Daily  . feeding supplement  1 Container Oral TID BM  . fentaNYL  200 mcg Transdermal Q72H  . fondaparinux (ARIXTRA) injection  7.5 mg Subcutaneous q1800  . gabapentin  400 mg Oral TID  . HYDROmorphone  10 mg Oral TID  . irbesartan  150 mg Oral Daily  . lidocaine  1 patch Transdermal Q24H  . LORazepam  1 mg Oral TID  . methocarbamol  1,000 mg Oral QID  . metoprolol succinate  100 mg Oral QHS  . mirtazapine  15 mg Oral QHS  . nutrition supplement (JUVEN)  1 packet Oral BID BM  . ondansetron  4 mg Oral Q6H  . pantoprazole  40 mg Oral Daily  . senna  1 tablet Oral QHS  . sodium chloride flush  3 mL Intravenous Q12H    Continuous Infusions: . sodium chloride    . sodium chloride      PRN Meds: sodium chloride, sodium chloride, alum & mag hydroxide-simeth, bisacodyl, guaiFENesin-dextromethorphan, hydrALAZINE, HYDROmorphone (DILAUDID) injection, HYDROmorphone, labetalol, LORazepam, naLOXone (NARCAN)  injection, phenol, polyethylene glycol, promethazine, sodium chloride flush  Physical Exam    Constitutional: He is oriented to person, place, and time. He appears well-developed.  HENT:  Head: Atraumatic.  Cardiovascular: Normal rate.  Pulmonary/Chest: Effort normal. No accessory muscle usage. No tachypnea. No respiratory distress.  Abdominal: Normal appearance.  Neurological: He is alert and oriented to person, place, and time.  Nursing note and vitals reviewed.           Vital Signs: BP (!) 175/95 (BP Location: Left Arm)   Pulse Marland Kitchen)  106   Temp 98.3 F (36.8 C) (Oral)   Resp 18   Ht '6\' 5"'$  (1.956 m)   Wt 86.1 kg (189 lb 12.8 oz)   SpO2 98%   BMI 22.51 kg/m  SpO2: SpO2: 98 % O2 Device: O2 Device: Room Air O2 Flow Rate: O2 Flow Rate (L/min): 2 L/min  Intake/output summary:   Intake/Output Summary (Last 24 hours) at 01/04/2018 1732 Last data filed at 01/04/2018 1300 Gross per 24 hour  Intake 960 ml  Output 850 ml  Net 110 ml   LBM: Last BM Date: 01/02/18 Baseline Weight: Weight: 71.6 kg (157 lb 13.6 oz) Most recent weight: Weight: 86.1 kg (189 lb 12.8 oz)       Palliative Assessment/Data: 50%   Flowsheet Rows     Most Recent Value  Intake Tab  Referral Department  Surgery  Palliative Care Primary Diagnosis  Cancer  Date Notified  12/25/17  Palliative Care Type  New Palliative care  Reason for referral  Clarify Goals of Care, Non-pain Symptom  Date of Admission  12/17/17  Date first seen by Palliative Care  12/26/17  # of days Palliative referral response time  1 Day(s)  # of days IP prior to Palliative referral  8  Clinical Assessment  Psychosocial & Spiritual Assessment  Palliative Care Outcomes      Patient Active Problem List   Diagnosis Date Noted  . Counseling regarding advance care planning and goals of care 01/01/2018  . Adenocarcinoma ([place])   . SVC syndrome 12/31/2017  . Adenocarcinoma of left lung, stage 4 ([place]) 12/31/2017  . Mass in neck   . Neck pain due to malignant neoplastic disease ([place])   . Palliative care by specialist   .  Uncontrolled pain   . Palliative care encounter   . Malignant neoplasm of upper lobe of right lung ([place]) 12/25/2017  . Gangrene of right foot ([place]) 12/20/2017  . AKI (acute kidney injury) ([place]) 12/20/2017  . PAD (peripheral artery disease) ([place]) 12/19/2017  . S/P aortobifemoral bypass surgery 12/17/2017  . Lung mass 12/17/2017  . Postoperative wound infection 12/17/2017  . Weight loss 12/17/2017  . Cervical lymphadenopathy 12/17/2017  . DVT (deep venous thrombosis) ([place]) 12/17/2017  . Aortoiliac occlusive disease ([place]) 11/15/2017  . Pain of right lower extremity due to ischemia 11/13/2017  . Hyperlipidemia LDL goal <70 11/13/2017  . PVD (peripheral vascular disease) ([place]) 11/11/2017  . H. pylori infection 10/11/2011  . Duodenal ulcer 08/23/2011  . IBS (irritable bowel syndrome) 08/18/2011  . Depression with anxiety 05/11/2009  . EPIGASTRIC PAIN, CHRONIC 05/11/2009  . SMOKER 09/01/2008  . INSOMNIA, CHRONIC 09/01/2008  . Essential hypertension 09/01/2008  . PALPITATIONS, CHRONIC 09/01/2008  . GASTRITIS 10/24/2007    Palliative Care Assessment & Plan   HPI: 52 y.o. male  with past medical history of HTN, HLD, arterial occlusive disease, PVD, IBS,  chronic back pain, anxiety, depression admitted on 12/17/2017 with increased drainage from incision sites and with critical limb ischemia of left lower extremity. S/P  Right Aortobifemoral bypass and fem-pop bypass 6/6 and found to thrombectomy with fem-pop bypass on left 6/22. Hospitalization complicated by uncontrolled pain likely s/t newly diagnosed metastatic adenocarcinoma with significant lymphadenopathy to neck. Also with venous thrombus of right subclavian, brachiocephalic, and IJ veins.   Assessment: I discussed briefly with Glenard Haring today as Mosie finished his walk. Glenard Haring shares information about his pain with me. She also shares that he is very anxious and more anxious with visits from  family that he has not seen in some time. He fears  what this means. Glenard Haring shares that she reassures him and tries to keep him thinking positive. She shares that they know what is going on and are realistic but want to keep positive thoughts. She says she told him that if anyone begins to talk negative she would kick them to the curb. Needless to say I do not think now is the right time to discuss code status or further GOC.   I met again today with Everest and wife, Glenard Haring. Discussed continued pain. Explained that there are no pain interventional options as we had discussed. Pain seems to be better in the evening ~5-6pm until ~2-3am. Discussed that he is not taking prn medication as much during this time and awakening in worsened pain. Will schedule po dilaudid overnight and patient wants to be awakened to take medication.   During discussion I recognized that his fentanyl patch is missing. I looked and Kaulder was unaware that this had gone missing and is not sure when this may have happened. Glenard Haring states that she knows the fentanyl patch was present last night. Taishaun questions if this was removed at time of radiation when he had his scan. He remembers someone telling him they needed to remove his patch at radiation but he thought this was his lidoderm patch but now is unsure. Ultimately fentanyl patch was not found and new patch ordered from pharmacy - discussed with RN. Will not up titrate fentanyl patch at this time since it is unknown how long he has had this on over the past 24 hrs.   They are concerned about string from left thigh incision site. Site is healing nicely. String likely from sutures that will absorb/dissolve. They are wondering if the external string may be cut so it does not pull??  Recommendations/Plan:  Poor intake: Boost/Breeze TID. Remeron + marinol (would consider d/c marinol). Improved.   Bowel Regimen: Senokot qhs. Having daily BM.   Anxiety: Ativan 1 mg TID + q8h prn. Also encouraged him to try and get outside and get some fresh  air. I believe he is stable to go off monitor with family member. Discussed with RN and Dr. Lonny Prude.   Right neck pain (r/t cancer):  ? Ice pack 15 min every 4 hours.  ? Tylenol 1000 mg po TID.  ? Dilaudid po 10 mg every 2 hours prn severe pain.  ? Dilaudid IV 1 mg every 30 min prn breakthrough pain.  ? Gabapentin 400 TID.  ? Lidoderm patch to right neck.  ? Fentanyl patch titrated up to 200 mcg/hr yesterday but patch not on pt today - unclear when/how it come off. New 200 mcg/hr patch ordered.  ? Cymbalta increased 40 mg daily. D/C Lexapro.  ? Decadron 4 mg IV TID.  ? Continue Robaxin 1000 mg QID ? Unfortunately Dr. Maryjean Ka has no interventional options that I can provide at this time with severity of vasculature. Recommends continued radiation therapy and anticoagulation.    Code Status:  Full code  Prognosis:   Overall poor prognosis with stage IV cancer newly diagnosed and gangrene to right foot. Attempt to better manage pain/symptoms to see if functional status may improve.   Discharge Planning:  To Be Determined  Care plan was discussed with patient, RN.  Thank you for allowing the Palliative Medicine Team to assist in the care of this patient.   Total Time 60 min Prolonged Time Billed  no  Greater than 50%  of this time was spent counseling and coordinating care related to the above assessment and plan.  Vinie Sill, NP Palliative Medicine Team Pager # [phone] (M-F 8a-5p) Team Phone # [phone] (Nights/Weekends)

## 2018-01-04 NOTE — Progress Notes (Signed)
PROGRESS NOTE    Tony [name]  MVH:[id number] DOB: Jun 19, 1965 DOA: 12/17/2017 PCP: [name], MD   Brief Narrative: Tony [name] is a 52 y.o. male with a history of PAD s/p fem-fem bypass, thrombectomy, post-op wound infection. Patient with new diagnosis of lung mass concerning for poorly differentiated adenocarcinoma of the lung. Patient is undergoing x-ray therapy and medical oncology planning on starting chemotherapy. Hospitalization complicated by poorly controlled pain.   Assessment & Plan:   Principal Problem:   Malignant neoplasm of upper lobe of right lung (H[facility]) Active Problems:   S/P aortobifemoral bypass surgery   Lung mass   Postoperative wound infection   Cervical lymphadenopathy   DVT (deep venous thrombosis) ([facility])   PAD (peripheral artery disease) ([facility])   Gangrene of right foot (H[facility])   AKI (acute kidney injury) ([place])   Uncontrolled pain   Palliative care encounter   Neck pain due to malignant neoplastic disease ([place])   Palliative care by specialist   Mass in neck   Adenocarcinoma [facility])   Counseling regarding advance care planning and goals of care   Right upper lobe nodule Concerning for metastatic adenocarcinoma of the lung. Biopsy consistent with lung vs upper GI source. Upper GI series was negative for evidence of malignancy -Oncology/radiation oncology recommendations  DVT Multiple. Patient on Xarelto as an outpatient. Currently on Arixtra -Per oncology  Right foot gangrene Plan for outpatient management per vascular surgery. No plan for surgical management inpatient.  Acute pain Secondary to malignancy. Significant. Palliative care on board to help with management. -Palliative care medicine recommendations -Continue fentanyl patch, gabapentin, steroids, robaxin  Anxiety Bipolar disorder -Continue Marinol, ativan, Lexapro  Left groin wound Left leg wound Discussed wound with vascular surgery. No recommendations. -Continue dressing  changes  Anemia Normocytic. Chronic disease. Stable. Hematology recommending to transfuse for hemoglobin less than 8   DVT prophylaxis: Arixtra Code Status:   Code Status: Full Code Family Communication: None at bedside Disposition Plan: Discharge pending better pain control   Consultants:   Palliative care  Vascular surgery  Medical oncology  Procedures:   XRT  Antimicrobials:  Vancomycin (7/8>>7/10)  Zosyn (7/8>> 7/10)  Unasyn (7/11>>7/13)  Augmentin (7/13>>7/21)   Subjective: Neck pain. Today he scratched his left groin and noticed a white strong eminating from the wound.  Objective: Vitals:   01/03/18 1354 01/03/18 2043 01/04/18 0502 01/04/18 1420  BP: (!) 158/83 (!) 166/96 (!) 161/93 (!) 175/95  Pulse: 67 89 76 (!) 106  Resp: 18 17 18 18   Temp: 98.1 F (36.7 C) 98.3 F (36.8 C) 98.5 F (36.9 C) 98.3 F (36.8 C)  TempSrc: Oral Oral Oral Oral  SpO2: 96% 95% 97% 98%  Weight:      Height:        Intake/Output Summary (Last 24 hours) at 01/04/2018 1527 Last data filed at 01/04/2018 0424 Gross per 24 hour  Intake 360 ml  Output 1250 ml  Net -890 ml   Filed Weights   12/18/17 1613 01/01/18 1100  Weight: 71.6 kg (157 lb 13.6 oz) 86.1 kg (189 lb 12.8 oz)    Examination:  General exam: Appears calm and comfortable Respiratory system: Clear to auscultation. Respiratory effort normal. Cardiovascular system: S1 & S2 heard, RRR. No murmurs. Gastrointestinal system: Abdomen is nondistended, soft and nontender. No organomegaly or masses felt. Normal bowel sounds heard. Central nervous system: Alert and oriented. No focal neurological deficits. Extremities: Mild LE edema and right upper extremity edema. No calf tenderness Skin:  No cyanosis. Right neck rash. Both left leg incisions without erythema or tenderness. Groin incision wound with white string material with knot Psychiatry: Judgement and insight appear normal. Seems slightly anxious    Data  Reviewed: I have personally reviewed following labs and imaging studies  CBC: Recent Labs  Lab 12/29/17 0315 01/01/18 0434 01/03/18 0451  WBC 9.4 15.2* 14.8*  NEUTROABS  --  13.9*  --   HGB 7.8* 8.1* 8.4*  HCT 25.5* 26.7* 27.8*  MCV 94.4 92.7 93.6  PLT 435* 495* 831*   Basic Metabolic Panel: Recent Labs  Lab 12/29/17 0315 01/01/18 0435 01/03/18 0451  NA 143 145 143  K 3.6 3.9 4.2  CL 108 109 107  CO2 25 26 26   GLUCOSE 124* 164* 141*  BUN 6 14 22*  CREATININE 1.07 0.90 0.79  CALCIUM 8.4* 8.5* 8.5*  PHOS  --  1.8* 2.7   GFR: Estimated Creatinine Clearance: 136 mL/min (by C-G formula based on SCr of 0.79 mg/dL). Liver Function Tests: Recent Labs  Lab 12/29/17 0315 01/01/18 0435  AST 19  --   ALT 14  --   ALKPHOS 82  --   BILITOT 0.5  --   PROT 5.8*  --   ALBUMIN 2.2* 2.3*   No results for input(s): LIPASE, AMYLASE in the last 168 hours. No results for input(s): AMMONIA in the last 168 hours. Coagulation Profile: No results for input(s): INR, PROTIME in the last 168 hours. Cardiac Enzymes: No results for input(s): CKTOTAL, CKMB, CKMBINDEX, TROPONINI in the last 168 hours. BNP (last 3 results) No results for input(s): PROBNP in the last 8760 hours. HbA1C: No results for input(s): HGBA1C in the last 72 hours. CBG: No results for input(s): GLUCAP in the last 168 hours. Lipid Profile: No results for input(s): CHOL, HDL, LDLCALC, TRIG, CHOLHDL, LDLDIRECT in the last 72 hours. Thyroid Function Tests: No results for input(s): TSH, T4TOTAL, FREET4, T3FREE, THYROIDAB in the last 72 hours. Anemia Panel: No results for input(s): VITAMINB12, FOLATE, FERRITIN, TIBC, IRON, RETICCTPCT in the last 72 hours. Sepsis Labs: No results for input(s): PROCALCITON, LATICACIDVEN in the last 168 hours.  No results found for this or any previous visit (from the past 240 hour(s)).       Radiology Studies: No results found.      Scheduled Meds: . acetaminophen  1,000 mg  Oral TID  . atorvastatin  40 mg Oral Daily  . dexamethasone  4 mg Intravenous TID  . dronabinol  2.5 mg Oral BID AC  . DULoxetine  30 mg Oral Daily  . escitalopram  5 mg Oral Daily  . feeding supplement  1 Container Oral TID BM  . fentaNYL  200 mcg Transdermal Q72H  . fondaparinux (ARIXTRA) injection  7.5 mg Subcutaneous q1800  . gabapentin  400 mg Oral TID  . irbesartan  150 mg Oral Daily  . lidocaine  1 patch Transdermal Q24H  . LORazepam  1 mg Oral TID  . methocarbamol  1,000 mg Oral QID  . metoprolol succinate  100 mg Oral QHS  . mirtazapine  15 mg Oral QHS  . nutrition supplement (JUVEN)  1 packet Oral BID BM  . ondansetron  4 mg Oral Q6H  . pantoprazole  40 mg Oral Daily  . senna  1 tablet Oral QHS  . sodium chloride flush  3 mL Intravenous Q12H   Continuous Infusions: . sodium chloride    . sodium chloride       LOS: 18  days     Cordelia Poche, MD [facility] 01/04/2018, 3:27 PM Pager: [phone]  If 7PM-7AM, please contact night-coverage [email or web address] 01/04/2018, 3:27 PM

## 2018-01-04 NOTE — Progress Notes (Signed)
[facility] [facility] Therapy Treatment Record Phone [phone]   Radiation Therapy was administered to Tony [name] on: 01/04/2018  11:01 AM and was treatment # 5 out of a planned course of 8 treatments.  Radiation Treatment  1). Beam photons with 6-10 energy  2). Brachytherapy None  3). Stereotactic Radiosurgery None  4). Other Radiation None     Byrd Hesselbach, RT (T)

## 2018-01-05 NOTE — Progress Notes (Signed)
Occupational Therapy Treatment Patient Details Name: Tony [name] MRN: [id number] DOB: 1966/02/09 Today's Date: 01/05/2018    History of present illness 52 year old man PMH critical limb ischemia, status post aortobifemoral bypass, right femoral artery to popliteal bypass, status post thrombectomy left limb aVF bypass.  Discharged on Xarelto.  Presented 7/8 with drainage from left groin wound and left below-knee incision.  Admitted by vascular surgery and started on IV antibiotics.  Because of neck pain, further imaging was obtained which revealed spiculated lung mass concerning for malignancy, patient transferred to hospitalist service. Pt found to have metastatic adenocarcinoma.       Follow Up Recommendations  Supervision - Intermittent Encouraged pt to keep BLE elevated and perform AROM with RUE for edema   Equipment Recommendations  None recommended by OT    Recommendations for Other Services      Precautions / Restrictions Precautions Precautions: Fall Precaution Comments: R neck and UE edema, gangrenous R toes  Restrictions Weight Bearing Restrictions: No       Mobility Bed Mobility Overal bed mobility: Modified Independent       Supine to sit: Modified independent (Device/Increase time) Sit to supine: Modified independent (Device/Increase time)   General bed mobility comments: increased time   Transfers Overall transfer level: Needs assistance   Transfers: Sit to/from Stand;Stand Pivot Transfers Sit to Stand: Supervision Stand pivot transfers: Supervision       General transfer comment: general S for safety    Balance Overall balance assessment: Needs assistance Sitting-balance support: No upper extremity supported;Feet supported Sitting balance-Leahy Scale: Normal     Standing balance support: During functional activity Standing balance-Leahy Scale: Fair Standing balance comment: continues to rely on B UE support                             ADL either performed or assessed with clinical judgement   ADL Overall ADL's : Needs assistance/impaired                                       General ADL Comments: Abdominal binder donned and pt educated on use.  MD present and made aware of edema in BLE also and discussed compression on RUE.  Pt performed sit to stand and fxla transfers in room with S     Vision       Perception     Praxis      Cognition Arousal/Alertness: Awake/alert Behavior During Therapy: [facility] for tasks assessed/performed Overall Cognitive Status: Within Functional Limits for tasks assessed                                                     Frequency  Min 2X/week        Progress Toward Goals  OT Goals(current goals can now be found in the care plan section)  Progress towards OT goals: Progressing toward goals     Plan Discharge plan remains appropriate    Co-evaluation                 AM-PAC PT "6 Clicks" Daily Activity     Outcome Measure   Help from another person eating meals?: None Help from another person taking care of  personal grooming?: A Little Help from another person toileting, which includes using toliet, bedpan, or urinal?: A Little Help from another person bathing (including washing, rinsing, drying)?: A Little Help from another person to put on and taking off regular upper body clothing?: A Little Help from another person to put on and taking off regular lower body clothing?: A Little 6 Click Score: 19    End of Session    OT Visit Diagnosis: Muscle weakness (generalized) (M62.81)   Activity Tolerance Patient tolerated treatment well   Patient Left in bed;with call bell/phone within reach;with family/visitor present   Nurse Communication Mobility status;Other (comment)(trunk edema)        Time: 1339-1400 OT Time Calculation (min): 21 min  Charges: OT General Charges $OT Visit: 1 Visit OT  Treatments $Therapeutic Activity: 8-22 mins  [place], [place]   Betsy Pries 01/05/2018, 2:27 PM

## 2018-01-05 NOTE — Progress Notes (Signed)
PROGRESS NOTE    Tony [name]  KWI:[id number] DOB: [birth year] DOA: 12/17/2017 PCP: [name], MD   Brief Narrative: Tony [name] is a 52 y.o. male with a history of PAD s/p fem-fem bypass, thrombectomy, post-op wound infection. Patient with new diagnosis of lung mass concerning for poorly differentiated adenocarcinoma of the lung. Patient is undergoing x-ray therapy and medical oncology planning on starting chemotherapy. Hospitalization complicated by poorly controlled pain.   Assessment & Plan:   Principal Problem:   Malignant neoplasm of upper lobe of right lung (HCC) Active Problems:   S/P aortobifemoral bypass surgery   Lung mass   Postoperative wound infection   Cervical lymphadenopathy   DVT (deep venous thrombosis) (HCC)   PAD (peripheral artery disease) (HCC)   Gangrene of right foot (HCC)   AKI (acute kidney injury) ([place])   Uncontrolled pain   Palliative care encounter   Neck pain due to malignant neoplastic disease ([place])   Palliative care by specialist   Mass in neck   Adenocarcinoma [facility])   Counseling regarding advance care planning and goals of care   Right upper lobe nodule Concerning for metastatic adenocarcinoma of the lung. Biopsy consistent with lung vs upper GI source. Upper GI series was negative for evidence of malignancy -Oncology/radiation oncology recommendations  DVT Multiple. Patient on Xarelto as an outpatient. Currently on Arixtra -Per oncology  Right foot gangrene Plan for outpatient management per vascular surgery. No plan for surgical management inpatient.  Acute pain Secondary to malignancy. Significant. Palliative care on board to help with management. Current regimen has started to help. Very thankful for PCM management -Continue fentanyl patch, gabapentin, steroids, robaxin per PCM  Anxiety Bipolar disorder -Continue Marinol, ativan, Lexapro  Left groin wound Left leg wound Discussed wound with vascular surgery on  7/26. No recommendations. -Continue dressing changes  Anemia Normocytic. Chronic disease. Stable. Hematology recommending to transfuse for hemoglobin less than 8   DVT prophylaxis: Arixtra Code Status:   Code Status: Full Code Family Communication: None at bedside Disposition Plan: Discharge pending better pain control   Consultants:   Palliative care  Vascular surgery  Medical oncology  Procedures:   XRT  Antimicrobials:  Vancomycin (7/8>>7/10)  Zosyn (7/8>> 7/10)  Unasyn (7/11>>7/13)  Augmentin (7/13>>7/21)   Subjective: Neck pain improved today compared to yesterday.  Objective: Vitals:   01/04/18 0502 01/04/18 1420 01/04/18 2012 01/05/18 0718  BP: (!) 161/93 (!) 175/95 (!) 160/90 (!) 146/91  Pulse: 76 (!) 106 (!) 101 79  Resp: 18 18 18 18   Temp: 98.5 F (36.9 C) 98.3 F (36.8 C) 98.7 F (37.1 C) 98.6 F (37 C)  TempSrc: Oral Oral    SpO2: 97% 98% 97% 96%  Weight:      Height:       No intake or output data in the 24 hours ending 01/05/18 1329 Filed Weights   12/18/17 1613 01/01/18 1100  Weight: 71.6 kg (157 lb 13.6 oz) 86.1 kg (189 lb 12.8 oz)    Examination:  General exam: Appears calm and comfortable   Data Reviewed: I have personally reviewed following labs and imaging studies  CBC: Recent Labs  Lab 01/01/18 0434 01/03/18 0451  WBC 15.2* 14.8*  NEUTROABS 13.9*  --   HGB 8.1* 8.4*  HCT 26.7* 27.8*  MCV 92.7 93.6  PLT 495* 242*   Basic Metabolic Panel: Recent Labs  Lab 01/01/18 0435 01/03/18 0451  NA 145 143  K 3.9 4.2  CL 109 107  CO2  26 26  GLUCOSE 164* 141*  BUN 14 22*  CREATININE 0.90 0.79  CALCIUM 8.5* 8.5*  PHOS 1.8* 2.7   GFR: Estimated Creatinine Clearance: 136 mL/min (by C-G formula based on SCr of 0.79 mg/dL). Liver Function Tests: Recent Labs  Lab 01/01/18 0435  ALBUMIN 2.3*   No results for input(s): LIPASE, AMYLASE in the last 168 hours. No results for input(s): AMMONIA in the last 168  hours. Coagulation Profile: No results for input(s): INR, PROTIME in the last 168 hours. Cardiac Enzymes: No results for input(s): CKTOTAL, CKMB, CKMBINDEX, TROPONINI in the last 168 hours. BNP (last 3 results) No results for input(s): PROBNP in the last 8760 hours. HbA1C: No results for input(s): HGBA1C in the last 72 hours. CBG: No results for input(s): GLUCAP in the last 168 hours. Lipid Profile: No results for input(s): CHOL, HDL, LDLCALC, TRIG, CHOLHDL, LDLDIRECT in the last 72 hours. Thyroid Function Tests: No results for input(s): TSH, T4TOTAL, FREET4, T3FREE, THYROIDAB in the last 72 hours. Anemia Panel: No results for input(s): VITAMINB12, FOLATE, FERRITIN, TIBC, IRON, RETICCTPCT in the last 72 hours. Sepsis Labs: No results for input(s): PROCALCITON, LATICACIDVEN in the last 168 hours.  No results found for this or any previous visit (from the past 240 hour(s)).       Radiology Studies: No results found.      Scheduled Meds: . acetaminophen  1,000 mg Oral TID  . atorvastatin  40 mg Oral Daily  . dexamethasone  4 mg Intravenous TID  . dronabinol  2.5 mg Oral BID AC  . DULoxetine  40 mg Oral Daily  . feeding supplement  1 Container Oral TID BM  . fentaNYL  200 mcg Transdermal Q72H  . fondaparinux (ARIXTRA) injection  7.5 mg Subcutaneous q1800  . gabapentin  400 mg Oral TID  . HYDROmorphone  10 mg Oral TID  . irbesartan  150 mg Oral Daily  . lidocaine  1 patch Transdermal Q24H  . LORazepam  1 mg Oral TID  . methocarbamol  1,000 mg Oral QID  . metoprolol succinate  100 mg Oral QHS  . mirtazapine  15 mg Oral QHS  . nutrition supplement (JUVEN)  1 packet Oral BID BM  . ondansetron  4 mg Oral Q6H  . pantoprazole  40 mg Oral Daily  . senna  1 tablet Oral QHS  . sodium chloride flush  3 mL Intravenous Q12H   Continuous Infusions: . sodium chloride    . sodium chloride       LOS: 19 days     Cordelia Poche, MD [facility] 01/05/2018, 1:29  PM Pager: [phone]  If 7PM-7AM, please contact night-coverage [email or web address] 01/05/2018, 1:29 PM

## 2018-01-05 NOTE — Progress Notes (Addendum)
Daily Progress Note   Patient Name: Tony [name]       Date: 01/05/2018 DOB: [birth year]  Age: 52 y.o. MRN#: [id number] Attending Physician: Mariel Aloe, MD Primary Care Physician: [name], MD Admit Date: 12/17/2017  Reason for Consultation/Follow-up: Pain control, psychosocial support  Subjective: Patient is resting in chair, OT at bedside, patient's sister is at bedside, patient is in good spirits Patient has fentanyl patch on.  He is in no distress currently He has used 19 mg IV Dilaudid in the past 24 hours.      Length of Stay: 19  Current Medications: Scheduled Meds:  . acetaminophen  1,000 mg Oral TID  . atorvastatin  40 mg Oral Daily  . dexamethasone  4 mg Intravenous TID  . dronabinol  2.5 mg Oral BID AC  . DULoxetine  40 mg Oral Daily  . feeding supplement  1 Container Oral TID BM  . fentaNYL  200 mcg Transdermal Q72H  . fondaparinux (ARIXTRA) injection  7.5 mg Subcutaneous q1800  . gabapentin  400 mg Oral TID  . HYDROmorphone  10 mg Oral TID  . irbesartan  150 mg Oral Daily  . lidocaine  1 patch Transdermal Q24H  . LORazepam  1 mg Oral TID  . methocarbamol  1,000 mg Oral QID  . metoprolol succinate  100 mg Oral QHS  . mirtazapine  15 mg Oral QHS  . nutrition supplement (JUVEN)  1 packet Oral BID BM  . ondansetron  4 mg Oral Q6H  . pantoprazole  40 mg Oral Daily  . senna  1 tablet Oral QHS  . sodium chloride flush  3 mL Intravenous Q12H    Continuous Infusions: . sodium chloride    . sodium chloride      PRN Meds: sodium chloride, sodium chloride, alum & mag hydroxide-simeth, bisacodyl, guaiFENesin-dextromethorphan, hydrALAZINE, HYDROmorphone (DILAUDID) injection, HYDROmorphone, labetalol, LORazepam, naLOXone (NARCAN)  injection, phenol,  polyethylene glycol, promethazine, sodium chloride flush  Physical Exam  Constitutional: He is oriented to person, place, and time. He appears well-developed.  HENT:  Head: Atraumatic.  Cardiovascular: Normal rate.  Pulmonary/Chest: Effort normal. No accessory muscle usage. No tachypnea. No respiratory distress.  Abdominal: Normal appearance.  Neurological: He is alert and oriented to person, place, and time.  Nursing note and vitals reviewed.  Patient has dry gangrene on most toes, L worse than R foot.  Has lymphedema    Vital Signs: BP (!) 146/91 (BP Location: Left Arm)   Pulse 79   Temp 98.6 F (37 C)   Resp 18   Ht 6\' 5"  (1.956 m)   Wt 86.1 kg (189 lb 12.8 oz)   SpO2 96%   BMI 22.51 kg/m  SpO2: SpO2: 96 % O2 Device: O2 Device: Room Air O2 Flow Rate: O2 Flow Rate (L/min): 2 L/min  Intake/output summary:  No intake or output data in the 24 hours ending 01/05/18 1419 LBM: Last BM Date: 01/02/18 Baseline Weight: Weight: 71.6 kg (157 lb 13.6 oz) Most recent weight: Weight: 86.1 kg (189 lb 12.8 oz)       Palliative Assessment/Data: 50%   Flowsheet Rows     Most Recent Value  Intake Tab  Referral Department  Surgery  Palliative Care Primary Diagnosis  Cancer  Date Notified  12/25/17  Palliative Care Type  New Palliative care  Reason for referral  Clarify Goals of Care, Non-pain Symptom  Date of Admission  12/17/17  Date first seen by Palliative Care  12/26/17  # of days Palliative referral response time  1 Day(s)  # of days IP prior to Palliative referral  8  Clinical Assessment  Psychosocial & Spiritual Assessment  Palliative Care Outcomes      Patient Active Problem List   Diagnosis Date Noted  . Counseling regarding advance care planning and goals of care 01/01/2018  . Adenocarcinoma ([place])   . SVC syndrome 12/31/2017  . Adenocarcinoma of left lung, stage 4 ([place]) 12/31/2017  . Mass in neck   . Neck pain due to malignant neoplastic disease ([place])   .  Palliative care by specialist   . Uncontrolled pain   . Palliative care encounter   . Malignant neoplasm of upper lobe of right lung ([place]) 12/25/2017  . Gangrene of right foot ([place]) 12/20/2017  . AKI (acute kidney injury) ([place]) 12/20/2017  . PAD (peripheral artery disease) ([place]) 12/19/2017  . S/P aortobifemoral bypass surgery 12/17/2017  . Lung mass 12/17/2017  . Postoperative wound infection 12/17/2017  . Weight loss 12/17/2017  . Cervical lymphadenopathy 12/17/2017  . DVT (deep venous thrombosis) ([place]) 12/17/2017  . Aortoiliac occlusive disease ([place]) 11/15/2017  . Pain of right lower extremity due to ischemia 11/13/2017  . Hyperlipidemia LDL goal <70 11/13/2017  . PVD (peripheral vascular disease) ([place]) 11/11/2017  . H. pylori infection 10/11/2011  . Duodenal ulcer 08/23/2011  . IBS (irritable bowel syndrome) 08/18/2011  . Depression with anxiety 05/11/2009  . EPIGASTRIC PAIN, CHRONIC 05/11/2009  . SMOKER 09/01/2008  . INSOMNIA, CHRONIC 09/01/2008  . Essential hypertension 09/01/2008  . PALPITATIONS, CHRONIC 09/01/2008  . GASTRITIS 10/24/2007    Palliative Care Assessment & Plan   HPI: 52 y.o. male  with past medical history of HTN, HLD, arterial occlusive disease, PVD, IBS,  chronic back pain, anxiety, depression admitted on 12/17/2017 with increased drainage from incision sites and with critical limb ischemia of left lower extremity. S/P  Right Aortobifemoral bypass and fem-pop bypass 6/6 and found to thrombectomy with fem-pop bypass on left 6/22. Hospitalization complicated by uncontrolled pain likely s/t newly diagnosed metastatic adenocarcinoma with significant lymphadenopathy to neck. Also with venous thrombus of right subclavian, brachiocephalic, and IJ veins.   Assessment: Patient in good spirits today, participating with OT assessment.       Recommendations/Plan:  Poor intake: Boost/Breeze TID. Remeron + marinol (would  consider d/c marinol). Improved.   Bowel  Regimen: Senokot qhs. Having daily BM.   Anxiety: Ativan 1 mg TID + q8h prn. Also encouraged him to try and get outside and get some fresh air. Ok to go off monitor with family member. Discussed with RN and Dr. Lonny Prude.   Right neck pain (r/t cancer):  ? Ice pack 15 min every 4 hours.  ? Tylenol 1000 mg po TID.  ? Dilaudid po 10 mg every 2 hours prn severe pain.  ? Dilaudid IV 1 mg every 30 min prn breakthrough pain.  ? Gabapentin 400 TID.  ? Lidoderm patch to right neck.  ? Fentanyl patch titrated up to 200 mcg/hr yesterday   ? Cymbalta  40 mg daily. D/C Lexapro.  ? Decadron 4 mg IV TID.  ? Continue Robaxin 1000 mg QID ? Unfortunately Dr. Maryjean Ka has no interventional options that I can provide at this time with severity of vasculature. Recommends continued radiation therapy and anticoagulation.   TED hose ordered.   Code Status:  Full code  Prognosis:   Overall poor prognosis with stage IV cancer newly diagnosed and gangrene to right foot. Attempt to better manage pain/symptoms to see if functional status may improve.   Discharge Planning:  To Be Determined  Care plan was discussed with patient, RN.  Thank you for allowing the Palliative Medicine Team to assist in the care of this patient.   Total Time 25 min Prolonged Time Billed  no      Greater than 50%  of this time was spent counseling and coordinating care related to the above assessment and plan.  Loistine Chance MD [phone]  Palliative Medicine Team Pager # [phone] (M-F 8a-5p) Team Phone # [phone] (Nights/Weekends)

## 2018-01-06 DIAGNOSIS — L03313 Cellulitis of chest wall: Secondary | ICD-10-CM

## 2018-01-06 DIAGNOSIS — I871 Compression of vein: Secondary | ICD-10-CM

## 2018-01-06 LAB — BASIC METABOLIC PANEL
Anion gap: 10 (ref 5–15)
BUN: 22 mg/dL — AB (ref 6–20)
CO2: 28 mmol/L (ref 22–32)
Calcium: 8.6 mg/dL — ABNORMAL LOW (ref 8.9–10.3)
Chloride: 105 mmol/L (ref 98–111)
Creatinine, Ser: 0.68 mg/dL (ref 0.61–1.24)
GFR calc Af Amer: >60 mL/min (ref >60)
GFR calc non Af Amer: >60 mL/min (ref >60)
GLUCOSE: 148 mg/dL — AB (ref 70–99)
Potassium: 4.3 mmol/L (ref 3.5–5.1)
Sodium: 143 mmol/L (ref 135–145)

## 2018-01-06 LAB — CBC
HEMATOCRIT: 28.7 % — AB (ref 39.0–52.0)
HEMOGLOBIN: 8.8 g/dL — AB (ref 13.0–17.0)
MCH: 28.7 pg (ref 26.0–34.0)
MCHC: 30.7 g/dL (ref 30.0–36.0)
MCV: 93.5 fL (ref 78.0–100.0)
Platelets: 379 10*3/uL (ref 150–400)
RBC: 3.07 MIL/uL — ABNORMAL LOW (ref 4.22–5.81)
RDW: 15.3 % (ref 11.5–15.5)
WBC: 16.5 10*3/uL — ABNORMAL HIGH (ref 4.0–10.5)

## 2018-01-06 MED ORDER — HYDROMORPHONE HCL 4 MG PO TABS
10.0000 mg | ORAL_TABLET | Freq: Three times a day (TID) | ORAL | Status: DC
  Administered 2018-01-06 – 2018-01-07 (×3): 10 mg via ORAL
  Filled 2018-01-06 (×3): qty 3

## 2018-01-06 MED ORDER — CEFAZOLIN SODIUM-DEXTROSE 1-4 GM/50ML-% IV SOLN
1.0000 g | Freq: Three times a day (TID) | INTRAVENOUS | Status: DC
  Administered 2018-01-06 – 2018-01-10 (×11): 1 g via INTRAVENOUS
  Filled 2018-01-06 (×13): qty 50

## 2018-01-06 NOTE — Progress Notes (Signed)
PROGRESS NOTE    Tony [name]  KWI:[id number] DOB: Sep 18, 1965 DOA: 12/17/2017 PCP: [name], MD   Brief Narrative: Tony [name] is a 52 y.o. male with a history of PAD s/p fem-fem bypass, thrombectomy, post-op wound infection. Patient with new diagnosis of lung mass concerning for poorly differentiated adenocarcinoma of the lung. Patient is undergoing x-ray therapy and medical oncology planning on starting chemotherapy. Hospitalization complicated by poorly controlled pain.   Assessment & Plan:   Principal Problem:   Malignant neoplasm of upper lobe of right lung (HCC) Active Problems:   S/P aortobifemoral bypass surgery   Lung mass   Postoperative wound infection   Cervical lymphadenopathy   DVT (deep venous thrombosis) (HCC)   PAD (peripheral artery disease) (HCC)   Gangrene of right foot (HCC)   AKI (acute kidney injury) ([place])   Uncontrolled pain   Palliative care encounter   Neck pain due to malignant neoplastic disease ([place])   Palliative care by specialist   Mass in neck   Adenocarcinoma [facility])   Counseling regarding advance care planning and goals of care   Right upper lobe nodule Concerning for metastatic adenocarcinoma of the lung. Biopsy consistent with lung vs upper GI source. Upper GI series was negative for evidence of malignancy -Oncology/radiation oncology recommendations  DVT Multiple. Patient on Xarelto as an outpatient. Currently on Arixtra -Per oncology  Right foot gangrene Plan for outpatient management per vascular surgery. No plan for surgical management inpatient.  Acute pain Secondary to malignancy. Significant. Palliative care on board to help with management. Current regimen has started to help. Very thankful for PCM management -Continue fentanyl patch, gabapentin, steroids, robaxin per PCM  Anxiety Bipolar disorder -Continue Marinol, ativan, Lexapro  Left groin wound Left leg wound Discussed wound with vascular surgery on  7/26. No recommendations. -Continue dressing changes  Anemia Normocytic. Chronic disease. Stable. Hematology recommending to transfuse for hemoglobin less than 8  Cellulitis In setting of radiation therapy. WBC elevated. Significant tenderness with associated erythema -Blood cultures -Cefazolin   DVT prophylaxis: Arixtra Code Status:   Code Status: Full Code Family Communication: None at bedside Disposition Plan: Discharge pending better pain control   Consultants:   Palliative care  Vascular surgery  Medical oncology  Procedures:   XRT  Antimicrobials:  Vancomycin (7/8>>7/10)  Zosyn (7/8>> 7/10)  Unasyn (7/11>>7/13)  Augmentin (7/13>>7/21)   Subjective: Neck pain worsened today  Objective: Vitals:   01/05/18 1431 01/05/18 2003 01/06/18 0420 01/06/18 0624  BP: (!) 160/83 (!) 156/84 (!) 163/86 (!) 158/84  Pulse: 87 (!) 106 94   Resp: 16 18 18    Temp: 98.5 F (36.9 C) 99.2 F (37.3 C) 98.8 F (37.1 C)   TempSrc: Oral Oral Oral   SpO2: 98% 93% 99%   Weight:      Height:        Intake/Output Summary (Last 24 hours) at 01/06/2018 1041 Last data filed at 01/06/2018 0330 Gross per 24 hour  Intake 240 ml  Output 700 ml  Net -460 ml   Filed Weights   12/18/17 1613 01/01/18 1100  Weight: 71.6 kg (157 lb 13.6 oz) 86.1 kg (189 lb 12.8 oz)    Examination:  General exam: Appears calm and comfortable Respiratory system: Clear to auscultation. Respiratory effort normal. Cardiovascular system: S1 & S2 heard, RRR. No murmurs, rubs, gallops or clicks. Gastrointestinal system: Abdomen is nondistended, soft and nontender. No organomegaly or masses felt. Normal bowel sounds heard. Central nervous system: Alert and oriented. No  focal neurological deficits. Extremities: 2+ LE edema with right UE edema. No calf tenderness Skin: No cyanosis. Dermatitis on right neck and upper chest. Erythema extending beyond dermatitis area which overlying tenderness Psychiatry:  Judgement and insight appear normal. Mood & affect appropriate.    Data Reviewed: I have personally reviewed following labs and imaging studies  CBC: Recent Labs  Lab 01/01/18 0434 01/03/18 0451 01/06/18 0350  WBC 15.2* 14.8* 16.5*  NEUTROABS 13.9*  --   --   HGB 8.1* 8.4* 8.8*  HCT 26.7* 27.8* 28.7*  MCV 92.7 93.6 93.5  PLT 495* 497* 253   Basic Metabolic Panel: Recent Labs  Lab 01/01/18 0435 01/03/18 0451 01/06/18 0350  NA 145 143 143  K 3.9 4.2 4.3  CL 109 107 105  CO2 26 26 28   GLUCOSE 164* 141* 148*  BUN 14 22* 22*  CREATININE 0.90 0.79 0.68  CALCIUM 8.5* 8.5* 8.6*  PHOS 1.8* 2.7  --    GFR: Estimated Creatinine Clearance: 136 mL/min (by C-G formula based on SCr of 0.68 mg/dL). Liver Function Tests: Recent Labs  Lab 01/01/18 0435  ALBUMIN 2.3*   No results for input(s): LIPASE, AMYLASE in the last 168 hours. No results for input(s): AMMONIA in the last 168 hours. Coagulation Profile: No results for input(s): INR, PROTIME in the last 168 hours. Cardiac Enzymes: No results for input(s): CKTOTAL, CKMB, CKMBINDEX, TROPONINI in the last 168 hours. BNP (last 3 results) No results for input(s): PROBNP in the last 8760 hours. HbA1C: No results for input(s): HGBA1C in the last 72 hours. CBG: No results for input(s): GLUCAP in the last 168 hours. Lipid Profile: No results for input(s): CHOL, HDL, LDLCALC, TRIG, CHOLHDL, LDLDIRECT in the last 72 hours. Thyroid Function Tests: No results for input(s): TSH, T4TOTAL, FREET4, T3FREE, THYROIDAB in the last 72 hours. Anemia Panel: No results for input(s): VITAMINB12, FOLATE, FERRITIN, TIBC, IRON, RETICCTPCT in the last 72 hours. Sepsis Labs: No results for input(s): PROCALCITON, LATICACIDVEN in the last 168 hours.  No results found for this or any previous visit (from the past 240 hour(s)).       Radiology Studies: No results found.      Scheduled Meds: . acetaminophen  1,000 mg Oral TID  .  atorvastatin  40 mg Oral Daily  . dexamethasone  4 mg Intravenous TID  . dronabinol  2.5 mg Oral BID AC  . DULoxetine  40 mg Oral Daily  . feeding supplement  1 Container Oral TID BM  . fentaNYL  200 mcg Transdermal Q72H  . fondaparinux (ARIXTRA) injection  7.5 mg Subcutaneous q1800  . gabapentin  400 mg Oral TID  . HYDROmorphone  10 mg Oral TID  . irbesartan  150 mg Oral Daily  . lidocaine  1 patch Transdermal Q24H  . LORazepam  1 mg Oral TID  . methocarbamol  1,000 mg Oral QID  . metoprolol succinate  100 mg Oral QHS  . mirtazapine  15 mg Oral QHS  . nutrition supplement (JUVEN)  1 packet Oral BID BM  . ondansetron  4 mg Oral Q6H  . pantoprazole  40 mg Oral Daily  . senna  1 tablet Oral QHS  . sodium chloride flush  3 mL Intravenous Q12H   Continuous Infusions: . sodium chloride    . sodium chloride    .  ceFAZolin (ANCEF) IV       LOS: 20 days     Cordelia Poche, MD [facility] 01/06/2018, 10:41 AM Pager: [phone]  If 7PM-7AM, please contact night-coverage [email or web address] 01/06/2018, 10:41 AM

## 2018-01-07 ENCOUNTER — Ambulatory Visit: Payer: BLUE CROSS/BLUE SHIELD | Admitting: Radiation Oncology

## 2018-01-07 ENCOUNTER — Inpatient Hospital Stay (HOSPITAL_COMMUNITY): Payer: BLUE CROSS/BLUE SHIELD

## 2018-01-07 ENCOUNTER — Ambulatory Visit
Admit: 2018-01-07 | Discharge: 2018-01-07 | Disposition: A | Discharge status: Home / Self Care | Payer: BLUE CROSS/BLUE SHIELD | Attending: Radiation Oncology | Admitting: Radiation Oncology

## 2018-01-07 MED ORDER — HYDROMORPHONE HCL 4 MG PO TABS: 15.0000 mg | ORAL_TABLET | Freq: Three times a day (TID) | ORAL | Status: DC

## 2018-01-07 MED ORDER — HYDROMORPHONE HCL 4 MG PO TABS: 15.0000 mg | ORAL_TABLET | ORAL | Status: DC | PRN

## 2018-01-07 MED ORDER — LORAZEPAM 0.5 MG PO TABS
1.5000 mg | ORAL_TABLET | Freq: Every day | ORAL | Status: DC
  Administered 2018-01-07 – 2018-01-16 (×10): 1.5 mg via ORAL
  Filled 2018-01-07 (×10): qty 1

## 2018-01-07 MED ORDER — HYDROMORPHONE HCL 8 MG PO TABS
16.0000 mg | ORAL_TABLET | Freq: Three times a day (TID) | ORAL | Status: DC
  Administered 2018-01-07 – 2018-01-11 (×12): 16 mg via ORAL
  Filled 2018-01-07 (×13): qty 2

## 2018-01-07 MED ORDER — HYDROMORPHONE HCL 8 MG PO TABS
16.0000 mg | ORAL_TABLET | ORAL | Status: DC | PRN
  Administered 2018-01-07 – 2018-01-14 (×31): 16 mg via ORAL
  Filled 2018-01-07 (×19): qty 2
  Filled 2018-01-07: qty 4
  Filled 2018-01-07 (×14): qty 2

## 2018-01-07 MED ORDER — POVIDONE-IODINE 10 % EX SOLN
CUTANEOUS | Status: DC | PRN
  Filled 2018-01-07: qty 118

## 2018-01-07 MED ORDER — IOHEXOL 300 MG/ML  SOLN
75.0000 mL | Freq: Once | INTRAMUSCULAR | Status: AC | PRN
  Administered 2018-01-07: 75 mL via INTRAVENOUS

## 2018-01-07 MED ORDER — LORAZEPAM 1 MG PO TABS
1.0000 mg | ORAL_TABLET | Freq: Two times a day (BID) | ORAL | Status: DC
  Administered 2018-01-08 – 2018-01-17 (×19): 1 mg via ORAL
  Filled 2018-01-07 (×19): qty 1

## 2018-01-07 MED ORDER — HYDROMORPHONE HCL 1 MG/ML IJ SOLN
1.0000 mg | INTRAMUSCULAR | Status: DC | PRN
  Administered 2018-01-07 – 2018-01-09 (×21): 2 mg via INTRAVENOUS
  Filled 2018-01-07 (×21): qty 2

## 2018-01-07 MED ORDER — FENTANYL 50 MCG/HR TD PT72
250.0000 ug | MEDICATED_PATCH | TRANSDERMAL | Status: DC
  Administered 2018-01-07: 250 ug via TRANSDERMAL
  Filled 2018-01-07: qty 1

## 2018-01-07 NOTE — Plan of Care (Signed)
  Problem: Pain Managment: Goal: General experience of comfort will improve Outcome: Not Progressing  Patient pain level remains at 8

## 2018-01-07 NOTE — Progress Notes (Signed)
PT Cancellation Note  Patient Details Name: BLAYKE CORDREY MRN: [id number] DOB: 06/21/1965   Cancelled Treatment:     am pt out of room Radiation                                            Pm soaking foot and waiting on RN for dressing   Rica Koyanagi  PTA [facility]  Acute  Rehab Pager      [phone]

## 2018-01-07 NOTE — Progress Notes (Signed)
PROGRESS NOTE    Tony [name]  GYI:[id number] DOB: August 07, 1965 DOA: 12/17/2017 PCP: [name], MD   Brief Narrative: Tony [name] is a 52 y.o. male with a history of PAD s/p fem-fem bypass, thrombectomy, post-op wound infection. Patient with new diagnosis of lung mass concerning for poorly differentiated adenocarcinoma of the lung. Patient is undergoing x-ray therapy and medical oncology planning on starting chemotherapy. Hospitalization complicated by poorly controlled pain.   Assessment & Plan:   Principal Problem:   Malignant neoplasm of upper lobe of right lung (HCC) Active Problems:   S/P aortobifemoral bypass surgery   Lung mass   Postoperative wound infection   Cervical lymphadenopathy   DVT (deep venous thrombosis) (HCC)   PAD (peripheral artery disease) (HCC)   Gangrene of right foot (HCC)   AKI (acute kidney injury) ([place])   Uncontrolled pain   Palliative care encounter   Neck pain due to malignant neoplastic disease ([place])   Palliative care by specialist   Mass in neck   Adenocarcinoma [facility])   Counseling regarding advance care planning and goals of care   Right upper lobe nodule Concerning for metastatic adenocarcinoma of the lung. Biopsy consistent with lung vs upper GI source. Upper GI series was negative for evidence of malignancy -Oncology/radiation oncology recommendations  DVT Multiple. Patient on Xarelto as an outpatient. Currently on Arixtra -Per oncology  Right foot gangrene Plan for outpatient management per vascular surgery. No plan for surgical management inpatient.  Acute pain Secondary to malignancy. Significant. Palliative care on board to help with management. Current regimen has started to help. Very thankful for PCM management -Continue fentanyl patch, gabapentin, steroids, robaxin per [facility] -Patient willing to try PCA now  Anxiety Bipolar disorder -Continue Marinol, ativan, Lexapro  Left groin wound Left leg wound Discussed  wound with vascular surgery on 7/26. No recommendations. -Continue dressing changes  Anemia Normocytic. Chronic disease. Stable. Hematology recommending to transfuse for hemoglobin less than 8  Cellulitis In setting of radiation therapy. WBC elevated. Significant tenderness with associated erythema -Blood cultures pending -Cefazolin -CT chest w/ contrast   DVT prophylaxis: Arixtra Code Status:   Code Status: Full Code Family Communication: Two daughters and wife at bedside Disposition Plan: Discharge pending better pain control   Consultants:   Palliative care  Vascular surgery  Medical oncology  Procedures:   XRT  Antimicrobials:  Vancomycin (7/8>>7/10)  Zosyn (7/8>> 7/10)  Unasyn (7/11>>7/13)  Augmentin (7/13>>7/21)   Subjective: Neck pain is worse at night or after sleeping.  Objective: Vitals:   01/06/18 1123 01/06/18 1442 01/06/18 2216 01/07/18 0456  BP:  (!) 152/98 (!) 176/94 (!) 167/93  Pulse:  (!) 106 (!) 111 97  Resp:  16 (!) 22 20  Temp: 98.7 F (37.1 C) 98.9 F (37.2 C) 98.2 F (36.8 C) 98 F (36.7 C)  TempSrc: Oral Oral Oral Oral  SpO2:  97% 99% 99%  Weight:      Height:        Intake/Output Summary (Last 24 hours) at 01/07/2018 1206 Last data filed at 01/07/2018 0524 Gross per 24 hour  Intake 870 ml  Output 1600 ml  Net -730 ml   Filed Weights   12/18/17 1613 01/01/18 1100  Weight: 71.6 kg (157 lb 13.6 oz) 86.1 kg (189 lb 12.8 oz)    Examination:  General exam: Appears calm and comfortable Respiratory system: Clear to auscultation. Respiratory effort normal. Cardiovascular system: S1 & S2 heard, RRR. No murmurs, rubs, gallops or clicks. Gastrointestinal  system: Abdomen is nondistended, soft and nontender. Normal bowel sounds heard. Central nervous system: Alert and oriented. No focal neurological deficits. Extremities: No edema. No calf tenderness Skin: No cyanosis. Erythematous rash over right neck and upper right chest  that is slightly more extended today but looks lighter Psychiatry: Judgement and insight appear normal. Mood & affect appropriate.    Data Reviewed: I have personally reviewed following labs and imaging studies  CBC: Recent Labs  Lab 01/01/18 0434 01/03/18 0451 01/06/18 0350  WBC 15.2* 14.8* 16.5*  NEUTROABS 13.9*  --   --   HGB 8.1* 8.4* 8.8*  HCT 26.7* 27.8* 28.7*  MCV 92.7 93.6 93.5  PLT 495* 497* 220   Basic Metabolic Panel: Recent Labs  Lab 01/01/18 0435 01/03/18 0451 01/06/18 0350  NA 145 143 143  K 3.9 4.2 4.3  CL 109 107 105  CO2 26 26 28   GLUCOSE 164* 141* 148*  BUN 14 22* 22*  CREATININE 0.90 0.79 0.68  CALCIUM 8.5* 8.5* 8.6*  PHOS 1.8* 2.7  --    GFR: Estimated Creatinine Clearance: 136 mL/min (by C-G formula based on SCr of 0.68 mg/dL). Liver Function Tests: Recent Labs  Lab 01/01/18 0435  ALBUMIN 2.3*   No results for input(s): LIPASE, AMYLASE in the last 168 hours. No results for input(s): AMMONIA in the last 168 hours. Coagulation Profile: No results for input(s): INR, PROTIME in the last 168 hours. Cardiac Enzymes: No results for input(s): CKTOTAL, CKMB, CKMBINDEX, TROPONINI in the last 168 hours. BNP (last 3 results) No results for input(s): PROBNP in the last 8760 hours. HbA1C: No results for input(s): HGBA1C in the last 72 hours. CBG: No results for input(s): GLUCAP in the last 168 hours. Lipid Profile: No results for input(s): CHOL, HDL, LDLCALC, TRIG, CHOLHDL, LDLDIRECT in the last 72 hours. Thyroid Function Tests: No results for input(s): TSH, T4TOTAL, FREET4, T3FREE, THYROIDAB in the last 72 hours. Anemia Panel: No results for input(s): VITAMINB12, FOLATE, FERRITIN, TIBC, IRON, RETICCTPCT in the last 72 hours. Sepsis Labs: No results for input(s): PROCALCITON, LATICACIDVEN in the last 168 hours.  No results found for this or any previous visit (from the past 240 hour(s)).       Radiology Studies: No results  found.      Scheduled Meds: . acetaminophen  1,000 mg Oral TID  . atorvastatin  40 mg Oral Daily  . dexamethasone  4 mg Intravenous TID  . dronabinol  2.5 mg Oral BID AC  . DULoxetine  40 mg Oral Daily  . feeding supplement  1 Container Oral TID BM  . fentaNYL  200 mcg Transdermal Q72H  . fondaparinux (ARIXTRA) injection  7.5 mg Subcutaneous q1800  . gabapentin  400 mg Oral TID  . HYDROmorphone  10 mg Oral TID  . irbesartan  150 mg Oral Daily  . lidocaine  1 patch Transdermal Q24H  . LORazepam  1 mg Oral TID  . methocarbamol  1,000 mg Oral QID  . metoprolol succinate  100 mg Oral QHS  . mirtazapine  15 mg Oral QHS  . nutrition supplement (JUVEN)  1 packet Oral BID BM  . ondansetron  4 mg Oral Q6H  . pantoprazole  40 mg Oral Daily  . senna  1 tablet Oral QHS  . sodium chloride flush  3 mL Intravenous Q12H   Continuous Infusions: . sodium chloride    . sodium chloride    .  ceFAZolin (ANCEF) IV 1 g (01/07/18 1113)  LOS: 21 days     Cordelia Poche, MD [facility] 01/07/2018, 12:06 PM Pager: [phone]  If 7PM-7AM, please contact night-coverage [email or web address] 01/07/2018, 12:06 PM

## 2018-01-07 NOTE — Progress Notes (Signed)
Daily Progress Note   Patient Name: Tony [name]       Date: 01/07/2018 DOB: [birth year]  Age: 52 y.o. MRN#: [id number] Attending Physician: Mariel Aloe, MD Primary Care Physician: [name], MD Admit Date: 12/17/2017  Reason for Consultation/Follow-up: Pain control, psychosocial support  Subjective: Tony [name] is sleeping. I did not awaken him as his nurse reports that he has had a difficult day and has not had much rest.   Length of Stay: 21  Current Medications: Scheduled Meds:  . acetaminophen  1,000 mg Oral TID  . atorvastatin  40 mg Oral Daily  . dexamethasone  4 mg Intravenous TID  . dronabinol  2.5 mg Oral BID AC  . DULoxetine  40 mg Oral Daily  . feeding supplement  1 Container Oral TID BM  . fentaNYL  250 mcg Transdermal Q72H  . fondaparinux (ARIXTRA) injection  7.5 mg Subcutaneous q1800  . gabapentin  400 mg Oral TID  . HYDROmorphone  15 mg Oral TID  . irbesartan  150 mg Oral Daily  . lidocaine  1 patch Transdermal Q24H  . [START ON 01/08/2018] LORazepam  1 mg Oral BID  . LORazepam  1.5 mg Oral QHS  . methocarbamol  1,000 mg Oral QID  . metoprolol succinate  100 mg Oral QHS  . mirtazapine  15 mg Oral QHS  . nutrition supplement (JUVEN)  1 packet Oral BID BM  . ondansetron  4 mg Oral Q6H  . pantoprazole  40 mg Oral Daily  . senna  1 tablet Oral QHS  . sodium chloride flush  3 mL Intravenous Q12H    Continuous Infusions: . sodium chloride    . sodium chloride    .  ceFAZolin (ANCEF) IV Stopped (01/07/18 1143)    PRN Meds: sodium chloride, sodium chloride, alum & mag hydroxide-simeth, bisacodyl, guaiFENesin-dextromethorphan, hydrALAZINE, HYDROmorphone (DILAUDID) injection, HYDROmorphone, labetalol, LORazepam, naLOXone (NARCAN)  injection, phenol,  polyethylene glycol, povidone-iodine, promethazine, sodium chloride flush  Physical Exam  Constitutional: He is oriented to person, place, and time. He appears well-developed.  HENT:  Head: Atraumatic.  Cardiovascular: Normal rate.  Pulmonary/Chest: Effort normal. No accessory muscle usage. No tachypnea. No respiratory distress.  Abdominal: Normal appearance.  Neurological: He is alert and oriented to person, place, and time.  Sleeping   Nursing note and  vitals reviewed.           Vital Signs: BP (!) 150/81 (BP Location: Left Arm)   Pulse (!) 108   Temp 98.8 F (37.1 C) (Oral)   Resp 16   Ht 6\' 5"  (1.956 m)   Wt 86.1 kg (189 lb 12.8 oz)   SpO2 98%   BMI 22.51 kg/m  SpO2: SpO2: 98 % O2 Device: O2 Device: Room Air O2 Flow Rate: O2 Flow Rate (L/min): 2 L/min  Intake/output summary:   Intake/Output Summary (Last 24 hours) at 01/07/2018 1642 Last data filed at 01/07/2018 0524 Gross per 24 hour  Intake 580 ml  Output 1200 ml  Net -620 ml   LBM: Last BM Date: 01/06/18 Baseline Weight: Weight: 71.6 kg (157 lb 13.6 oz) Most recent weight: Weight: 86.1 kg (189 lb 12.8 oz)       Palliative Assessment/Data: 50%   Flowsheet Rows     Most Recent Value  Intake Tab  Referral Department  Surgery  Palliative Care Primary Diagnosis  Cancer  Date Notified  12/25/17  Palliative Care Type  New Palliative care  Reason for referral  Clarify Goals of Care, Non-pain Symptom  Date of Admission  12/17/17  Date first seen by Palliative Care  12/26/17  # of days Palliative referral response time  1 Day(s)  # of days IP prior to Palliative referral  8  Clinical Assessment  Psychosocial & Spiritual Assessment  Palliative Care Outcomes      Patient Active Problem List   Diagnosis Date Noted  . Counseling regarding advance care planning and goals of care 01/01/2018  . Adenocarcinoma ([place])   . SVC syndrome 12/31/2017  . Adenocarcinoma of left lung, stage 4 ([place]) 12/31/2017  . Mass in  neck   . Neck pain due to malignant neoplastic disease ([place])   . Palliative care by specialist   . Uncontrolled pain   . Palliative care encounter   . Malignant neoplasm of upper lobe of right lung ([place]) 12/25/2017  . Gangrene of right foot ([place]) 12/20/2017  . AKI (acute kidney injury) ([place]) 12/20/2017  . PAD (peripheral artery disease) ([place]) 12/19/2017  . S/P aortobifemoral bypass surgery 12/17/2017  . Lung mass 12/17/2017  . Postoperative wound infection 12/17/2017  . Weight loss 12/17/2017  . Cervical lymphadenopathy 12/17/2017  . DVT (deep venous thrombosis) ([place]) 12/17/2017  . Aortoiliac occlusive disease ([place]) 11/15/2017  . Pain of right lower extremity due to ischemia 11/13/2017  . Hyperlipidemia LDL goal <70 11/13/2017  . PVD (peripheral vascular disease) ([place]) 11/11/2017  . H. pylori infection 10/11/2011  . Duodenal ulcer 08/23/2011  . IBS (irritable bowel syndrome) 08/18/2011  . Depression with anxiety 05/11/2009  . EPIGASTRIC PAIN, CHRONIC 05/11/2009  . SMOKER 09/01/2008  . INSOMNIA, CHRONIC 09/01/2008  . Essential hypertension 09/01/2008  . PALPITATIONS, CHRONIC 09/01/2008  . GASTRITIS 10/24/2007    Palliative Care Assessment & Plan   HPI: 52 y.o. male  with past medical history of HTN, HLD, arterial occlusive disease, PVD, IBS,  chronic back pain, anxiety, depression admitted on 12/17/2017 with increased drainage from incision sites and with critical limb ischemia of left lower extremity. S/P  Right Aortobifemoral bypass and fem-pop bypass 6/6 and found to thrombectomy with fem-pop bypass on left 6/22. Hospitalization complicated by uncontrolled pain likely s/t newly diagnosed metastatic adenocarcinoma with significant lymphadenopathy to neck. Also with venous thrombus of right subclavian, brachiocephalic, and IJ veins.   Assessment: Tony [name] is sleeping. I spoke extensively with bedside  RN Mirna Mires as well as Dr. Lonny Prude regarding pain management. He continues to have  pattern of worsening pain overnight and into late morning before he gets some relief. Stressed the importance of utilizing po dilaudid over IV dilaudid and I increased dosage or po dilaudid with hopes of improved relief. I did not awaken Tony [name] to encourage him to use po dilaudid but will return in the morning to discuss and reassess. See changes to regimen listed below. Changes made today noted in red.   Recommendations/Plan:  Poor intake: Boost/Breeze TID. Remeron + marinol (would consider d/c marinol). Improved.   Bowel Regimen: Senokot qhs. Having daily BM.   Anxiety: Ativan 1 mg TID + q8h prn. Increased nighttime dose Ativan po to 1.5 mg.   Right neck pain (r/t cancer/clots):  ? Ice pack 15 min every 4 hours.  ? Tylenol 1000 mg po TID.  ? Dilaudid po 10 mg increased to 16 mg every 2 hours prn severe pain. Increased scheduled nighttime po dilaudid from 10 mg to 16 mg given at 2200, 0200, 0600.  ? Dilaudid IV 1 mg every 1 hour prn breakthrough pain.  ? Gabapentin 400 TID.  ? Lidoderm patch to right neck.  ? Fentanyl patch titrated up to 250 mcg/hr TODAY. ? Cymbalta 40 mg daily. D/C Lexapro.  ? Decadron 4 mg IV TID.  ? Continue Robaxin 1000 mg QID ? Unfortunately Dr. Maryjean Ka has no interventional options that I can provide at this time with severity of vasculature. Recommends continued radiation therapy and anticoagulation.  ? Spoke with RN to encourage po dilaudid!!! Must give po dilaudid at least every other prn (in between IV dilaudid doses).    Code Status:  Full code  Prognosis:   Overall poor prognosis with stage IV cancer newly diagnosed and gangrene to right foot. Attempt to better manage pain/symptoms to see if functional status may improve.   Discharge Planning:  To Be Determined  Care plan was discussed with RN, Dr. Lonny Prude, Dr. Rowe Pavy.  Thank you for allowing the Palliative Medicine Team to assist in the care of this patient.   Total Time 15 min Prolonged Time  Billed  no      Greater than 50%  of this time was spent counseling and coordinating care related to the above assessment and plan.  Vinie Sill, NP Palliative Medicine Team Pager # [phone] (M-F 8a-5p) Team Phone # [phone] (Nights/Weekends)

## 2018-01-07 NOTE — Plan of Care (Signed)
  Problem: Activity: Goal: Risk for activity intolerance will decrease Outcome: Progressing   Problem: Clinical Measurements: Goal: Will remain free from infection Outcome: Not Progressing   Problem: Pain Managment: Goal: General experience of comfort will improve Outcome: Not Progressing

## 2018-01-07 NOTE — Progress Notes (Signed)
Occupational Therapy Treatment Patient Details Name: Tony [name] MRN: [id number] DOB: 12-01-1965 Today's Date: 01/07/2018    History of present illness 52 year old man PMH critical limb ischemia, status post aortobifemoral bypass, right femoral artery to popliteal bypass, status post thrombectomy left limb aVF bypass.  Discharged on Xarelto.  Presented 7/8 with drainage from left groin wound and left below-knee incision.  Admitted by vascular surgery and started on IV antibiotics.  Because of neck pain, further imaging was obtained which revealed spiculated lung mass concerning for malignancy, patient transferred to hospitalist service. Pt found to have metastatic adenocarcinoma.    OT comments    Follow Up Recommendations  Supervision - Intermittent    Equipment Recommendations  None recommended by OT    Recommendations for Other Services      Precautions / Restrictions Restrictions Weight Bearing Restrictions: No       Mobility Bed Mobility Overal bed mobility: Modified Independent       Supine to sit: Modified independent (Device/Increase time) Sit to supine: Modified independent (Device/Increase time)   General bed mobility comments: increased time   Transfers Overall transfer level: Needs assistance   Transfers: Sit to/from Stand;Stand Pivot Transfers Sit to Stand: Supervision Stand pivot transfers: Supervision       General transfer comment: general S for safety    Balance Overall balance assessment: Needs assistance Sitting-balance support: No upper extremity supported;Feet supported Sitting balance-Leahy Scale: Normal     Standing balance support: During functional activity Standing balance-Leahy Scale: Fair Standing balance comment: continues to rely on B UE support                            ADL either performed or assessed with clinical judgement   ADL Overall ADL's : Needs assistance/impaired                    OT  session focused on edema.  Reiterated importance of keeping BLE and RUE elevated.                     General ADL Comments: Pt had on TED hose as well as RUE compression sleeve.  Noted increased edema in neck area and decreased edema in BLE and RUE.  Pt did not have on binder as he said he was taking a break     Vision Baseline Vision/History: Wears glasses            Cognition Arousal/Alertness: Awake/alert Behavior During Therapy: WFL for tasks assessed/performed Overall Cognitive Status: Within Functional Limits for tasks assessed                                                General Comments      Pertinent Vitals/ Pain       Pain Score: 3  Pain Location: R neck  Pain Descriptors / Indicators: Aching;Discomfort Pain Intervention(s): Limited activity within patient's tolerance         Frequency  Min 2X/week           Plan Discharge plan remains appropriate       AM-PAC PT "6 Clicks" Daily Activity     Outcome Measure   Help from another person eating meals?: None Help from another person taking care of personal grooming?: A Little Help from another  person toileting, which includes using toliet, bedpan, or urinal?: A Little Help from another person bathing (including washing, rinsing, drying)?: A Little Help from another person to put on and taking off regular upper body clothing?: A Little Help from another person to put on and taking off regular lower body clothing?: A Little 6 Click Score: 19    End of Session    OT Visit Diagnosis: Muscle weakness (generalized) (M62.81)   Activity Tolerance Patient tolerated treatment well   Patient Left in bed;with call bell/phone within reach;with family/visitor present   Nurse Communication Mobility status;Other (comment)(trunk edema)        Time:  -    7255-0016 Charges:  Alan Ripper, [place]   Payton Mccallum D 01/07/2018, 11:56 AM

## 2018-01-08 ENCOUNTER — Ambulatory Visit
Admit: 2018-01-08 | Discharge: 2018-01-08 | Disposition: A | Discharge status: Home / Self Care | Payer: BLUE CROSS/BLUE SHIELD | Attending: Radiation Oncology | Admitting: Radiation Oncology

## 2018-01-08 ENCOUNTER — Inpatient Hospital Stay: Payer: Self-pay | Admitting: Hematology

## 2018-01-08 ENCOUNTER — Inpatient Hospital Stay: Payer: BLUE CROSS/BLUE SHIELD | Admitting: Hematology

## 2018-01-08 LAB — BASIC METABOLIC PANEL
ANION GAP: 11 (ref 5–15)
BUN: 16 mg/dL (ref 6–20)
CALCIUM: 8.5 mg/dL — AB (ref 8.9–10.3)
CO2: 28 mmol/L (ref 22–32)
CREATININE: 0.66 mg/dL (ref 0.61–1.24)
Chloride: 104 mmol/L (ref 98–111)
Glucose, Bld: 126 mg/dL — ABNORMAL HIGH (ref 70–99)
Potassium: 4.7 mmol/L (ref 3.5–5.1)
SODIUM: 143 mmol/L (ref 135–145)

## 2018-01-08 LAB — CBC
HCT: 27.8 % — ABNORMAL LOW (ref 39.0–52.0)
HEMOGLOBIN: 8.4 g/dL — AB (ref 13.0–17.0)
MCH: 28 pg (ref 26.0–34.0)
MCHC: 30.2 g/dL (ref 30.0–36.0)
MCV: 92.7 fL (ref 78.0–100.0)
Platelets: 342 10*3/uL (ref 150–400)
RBC: 3 MIL/uL — AB (ref 4.22–5.81)
RDW: 16.2 % — ABNORMAL HIGH (ref 11.5–15.5)
WBC: 14.1 10*3/uL — ABNORMAL HIGH (ref 4.0–10.5)

## 2018-01-08 MED ORDER — METHOCARBAMOL 500 MG PO TABS
1000.0000 mg | ORAL_TABLET | Freq: Four times a day (QID) | ORAL | Status: DC
Start: 2018-01-08 — End: 2018-01-15
  Administered 2018-01-08 – 2018-01-15 (×27): 1000 mg via ORAL
  Filled 2018-01-08 (×28): qty 2

## 2018-01-08 MED ORDER — DULOXETINE HCL 60 MG PO CPEP
60.0000 mg | ORAL_CAPSULE | Freq: Every day | ORAL | Status: DC
  Administered 2018-01-09 – 2018-01-23 (×15): 60 mg via ORAL
  Filled 2018-01-08 (×15): qty 1

## 2018-01-08 MED ORDER — METHOCARBAMOL 500 MG PO TABS
1000.0000 mg | ORAL_TABLET | Freq: Four times a day (QID) | ORAL | Status: AC
  Administered 2018-01-08: 1000 mg via ORAL
  Filled 2018-01-08: qty 2

## 2018-01-08 MED ORDER — ONDANSETRON HCL 4 MG PO TABS
4.0000 mg | ORAL_TABLET | Freq: Four times a day (QID) | ORAL | Status: DC | PRN
  Administered 2018-01-09 – 2018-01-23 (×11): 4 mg via ORAL
  Filled 2018-01-08 (×11): qty 1

## 2018-01-08 NOTE — Progress Notes (Signed)
PHYSICAL THERAPY   Attempted to see in am but out of room at Radiation.  Will attempt to see as schedule permits.  Rica Koyanagi  PTA [facility]  Acute  Rehab Pager      [phone]

## 2018-01-08 NOTE — Progress Notes (Addendum)
PROGRESS NOTE    Tony [name]  LTJ:[id number] DOB: 04-29-1966 DOA: 12/17/2017 PCP: [name], MD   Brief Narrative: Tony [name] is a 52 y.o. male with a history of PAD s/p fem-fem bypass, thrombectomy, post-op wound infection. Patient with new diagnosis of lung mass concerning for poorly differentiated adenocarcinoma of the lung. Patient is undergoing x-ray therapy and medical oncology planning on starting chemotherapy. Hospitalization complicated by poorly controlled pain.   Assessment & Plan:   Principal Problem:   Malignant neoplasm of upper lobe of right lung (HCC) Active Problems:   S/P aortobifemoral bypass surgery   Lung mass   Postoperative wound infection   Cervical lymphadenopathy   DVT (deep venous thrombosis) (HCC)   PAD (peripheral artery disease) (HCC)   Gangrene of right foot (HCC)   AKI (acute kidney injury) ([place])   Uncontrolled pain   Palliative care encounter   Neck pain due to malignant neoplastic disease ([place])   Palliative care by specialist   Mass in neck   Adenocarcinoma [facility])   Counseling regarding advance care planning and goals of care   Right upper lobe nodule Concerning for metastatic adenocarcinoma of the lung. Biopsy consistent with lung vs upper GI source. Upper GI series was negative for evidence of malignancy -Oncology/radiation oncology recommendations: planning chemotherapy at some point; preferably once outpatient but plan for ?starting tomorrow  DVT Upper extremity Multiple. Patient on Xarelto as an outpatient. Currently on Arixtra -Per oncology -Will need plan for transition to oral regimen  Right foot gangrene Plan for outpatient management per vascular surgery. No plan for surgical management inpatient.  Acute pain Secondary to malignancy. Significant. Palliative care on board to help with management. Current regimen has started to help. Possibly complicated by additional diagnosis of cellulitis. Very thankful for PCM  management -Continue fentanyl patch, gabapentin, decadron, robaxin per PCM  Anxiety Bipolar disorder -Continue Marinol, ativan, Lexapro  Left groin wound Left leg wound Discussed wound with vascular surgery on 7/26. No recommendations. -Continue dressing changes  Anemia Normocytic. Chronic disease. Stable. Hematology recommending to transfuse for hemoglobin less than 8  Cellulitis In setting of radiation therapy. WBC elevated but is now trended down slightly. Significant tenderness with associated erythema. CT chest significant for no abscess -Blood cultures pending (no growth to date) -Cefazolin   DVT prophylaxis: Arixtra Code Status:   Code Status: Full Code Family Communication: Two daughters and wife at bedside Disposition Plan: Discharge pending better pain control   Consultants:   Palliative care  Vascular surgery  Medical oncology  Procedures:   XRT  Antimicrobials:  Vancomycin (7/8>>7/10)  Zosyn (7/8>> 7/10)  Unasyn (7/11>>7/13)  Augmentin (7/13>>7/21)   Subjective: Pain significant overnight. Afebrile.  Objective: Vitals:   01/07/18 0456 01/07/18 1259 01/07/18 2121 01/08/18 0632  BP: (!) 167/93 (!) 150/81 (!) 160/77 (!) 150/89  Pulse: 97 (!) 108 (!) 102 95  Resp: 20 16 18 20   Temp: 98 F (36.7 C) 98.8 F (37.1 C) 98.5 F (36.9 C) 99.2 F (37.3 C)  TempSrc: Oral Oral Oral Oral  SpO2: 99% 98% 96% 92%  Weight:      Height:        Intake/Output Summary (Last 24 hours) at 01/08/2018 1222 Last data filed at 01/08/2018 0800 Gross per 24 hour  Intake 150 ml  Output 425 ml  Net -275 ml   Filed Weights   12/18/17 1613 01/01/18 1100  Weight: 71.6 kg (157 lb 13.6 oz) 86.1 kg (189 lb 12.8 oz)  Examination:  General exam: Appears calm and comfortable Respiratory system: Respiratory effort normal. Gastrointestinal system: Abdomen is nondistended  Central nervous system: Alert and oriented. No focal neurological deficits. Extremities:  Right arm edema. No calf tenderness Skin: No cyanosis. Erythema around right neck and upper chest   Data Reviewed: I have personally reviewed following labs and imaging studies  CBC: Recent Labs  Lab 01/03/18 0451 01/06/18 0350 01/08/18 0350  WBC 14.8* 16.5* 14.1*  HGB 8.4* 8.8* 8.4*  HCT 27.8* 28.7* 27.8*  MCV 93.6 93.5 92.7  PLT 497* 379 546   Basic Metabolic Panel: Recent Labs  Lab 01/03/18 0451 01/06/18 0350 01/08/18 0350  NA 143 143 143  K 4.2 4.3 4.7  CL 107 105 104  CO2 26 28 28   GLUCOSE 141* 148* 126*  BUN 22* 22* 16  CREATININE 0.79 0.68 0.66  CALCIUM 8.5* 8.6* 8.5*  PHOS 2.7  --   --    GFR: Estimated Creatinine Clearance: 136 mL/min (by C-G formula based on SCr of 0.66 mg/dL). Liver Function Tests: No results for input(s): AST, ALT, ALKPHOS, BILITOT, PROT, ALBUMIN in the last 168 hours. No results for input(s): LIPASE, AMYLASE in the last 168 hours. No results for input(s): AMMONIA in the last 168 hours. Coagulation Profile: No results for input(s): INR, PROTIME in the last 168 hours. Cardiac Enzymes: No results for input(s): CKTOTAL, CKMB, CKMBINDEX, TROPONINI in the last 168 hours. BNP (last 3 results) No results for input(s): PROBNP in the last 8760 hours. HbA1C: No results for input(s): HGBA1C in the last 72 hours. CBG: No results for input(s): GLUCAP in the last 168 hours. Lipid Profile: No results for input(s): CHOL, HDL, LDLCALC, TRIG, CHOLHDL, LDLDIRECT in the last 72 hours. Thyroid Function Tests: No results for input(s): TSH, T4TOTAL, FREET4, T3FREE, THYROIDAB in the last 72 hours. Anemia Panel: No results for input(s): VITAMINB12, FOLATE, FERRITIN, TIBC, IRON, RETICCTPCT in the last 72 hours. Sepsis Labs: No results for input(s): PROCALCITON, LATICACIDVEN in the last 168 hours.  Recent Results (from the past 240 hour(s))  Culture, blood (routine x 2)     Status: None (Preliminary result)   Collection Time: 01/06/18 11:29 AM  Result  Value Ref Range Status   Specimen Description   Final    BLOOD LEFT ANTECUBITAL Performed at [facility] [address], [place], [place] [address]    Special Requests   Final    BOTTLES DRAWN AEROBIC ONLY Blood Culture adequate volume Performed at [facility] [address] [place], [place] [address]    Culture   Final    NO GROWTH < 24 HOURS Performed at [facility] [address], [place], [place] [address]    Report Status PENDING  Incomplete  Culture, blood (routine x 2)     Status: None (Preliminary result)   Collection Time: 01/06/18 11:30 AM  Result Value Ref Range Status   Specimen Description   Final    BLOOD LEFT WRIST Performed at [facility] [address], [place], [place] [address]    Special Requests   Final    BOTTLES DRAWN AEROBIC ONLY Blood Culture adequate volume Performed at [facility] [address] [place], [place] [address]    Culture   Final    NO GROWTH < 24 HOURS Performed at [facility] [address] [place], [place] [address]    Report Status PENDING  Incomplete  Radiology Studies: Ct Chest W Contrast  Result Date: 01/07/2018 CLINICAL DATA:  Newly diagnosed stage IV cancer. Swelling in right chest. Swelling in neck. Question abscess of chest. Chest cellulitis. EXAM: CT CHEST WITH CONTRAST TECHNIQUE: Multidetector CT imaging of the chest was performed during intravenous contrast administration. CONTRAST:  10mL OMNIPAQUE IOHEXOL 300 MG/ML  SOLN COMPARISON:  CT of the chest December 24, 2017 FINDINGS: Cardiovascular: The thoracic aorta is nonaneurysmal with no dissection. No coronary artery calcifications. The heart is normal. The main pulmonary artery is normal in caliber. The right hilar adenopathy exerts mass effect on the upper lobe pulmonary artery on the right without obvious occlusion on limited visualization. No definitive pulmonary  emboli. Mediastinum/Nodes: Bulky adenopathy is seen in the base of neck bilaterally. A representative node in the left base of neck on series 2, image 9 measures 2.2 cm today versus 2.0 cm previously. Adenopathy in the right base of neck persists. The representative node to the right of the trachea on series 2, image 22 measures 1.9 cm on both studies. The adenopathy in the right neck may be bulkier but is difficult to compare due to difference in slice selection. There is deviation of the larynx to the left which is slightly more pronounced without airway narrowing. A right paratracheal node in the upper mediastinum measures 4 cm today versus 3.6 cm previously. Adenopathy involves the subcarinal region, the precarinal region, the prevascular space, and the right hilum. No effusions. The esophagus is normal. The central SVC remains patent. There is poor opacification of the right subclavian vein, right brachiocephalic vein, and right internal jugular vein, consistent with known DVT seen on previous imaging. Significant edema in the chest wall diffusely. Skin thickening. No abscess identified in the chest wall. Bulky adenopathy again noted in the right axilla more prominent the interval. A representative node in the right axilla on series 2, image 66 measures 1.5 cm today versus 1.2 cm previously. Bulky adenopathy in the right retropectoral region appears more prominent as well. No axillary adenopathy on the left. No adenopathy in the left retropectoral region. Lungs/Pleura: The spiculated nodule in the right upper lobe measures 1.4 by 2.4 by 1.3 cm today, unchanged in the interval. Minimal ground-glass in the left apex may represent minimal infectious or inflammatory change. No other nodules or masses. No other changes. Mild paraseptal emphysematous changes in the apices. Central airways are normal. No pneumothorax. Upper Abdomen: No acute abnormality. Musculoskeletal: No chest wall abnormality. No acute or  significant osseous findings. IMPRESSION: 1. The known spiculated mass in the right upper lobe is stable in size. Bulky adenopathy in the cervical lymph node chains, right greater than left; the mediastinum; the right axilla; the right retropectoral region; and the right hilum is overall worsened in the interval. 2. Poor enhancement in the right internal jugular, brachiocephalic, and subclavian veins consistent with previously reported DVT in his locations. 3. Severe edema in the chest wall with no abscess identified. 4. Mass effect on the right upper lobe pulmonary artery due to right hilar adenopathy without complete occlusion. No obvious DVT on limited images. Emphysema (ICD10-J43.9). Electronically Signed   By: Dorise Bullion III M.D   On: 01/07/2018 13:52        Scheduled Meds: . acetaminophen  1,000 mg Oral TID  . atorvastatin  40 mg Oral Daily  . dexamethasone  4 mg Intravenous TID  . dronabinol  2.5 mg Oral BID AC  . [START ON 01/09/2018] DULoxetine  60 mg Oral Daily  .  feeding supplement  1 Container Oral TID BM  . fentaNYL  250 mcg Transdermal Q72H  . fondaparinux (ARIXTRA) injection  7.5 mg Subcutaneous q1800  . gabapentin  400 mg Oral TID  . HYDROmorphone  16 mg Oral TID  . irbesartan  150 mg Oral Daily  . lidocaine  1 patch Transdermal Q24H  . LORazepam  1 mg Oral BID  . LORazepam  1.5 mg Oral QHS  . methocarbamol  1,000 mg Oral QID  . metoprolol succinate  100 mg Oral QHS  . mirtazapine  15 mg Oral QHS  . nutrition supplement (JUVEN)  1 packet Oral BID BM  . ondansetron  4 mg Oral Q6H  . pantoprazole  40 mg Oral Daily  . senna  1 tablet Oral QHS  . sodium chloride flush  3 mL Intravenous Q12H   Continuous Infusions: . sodium chloride    . sodium chloride    .  ceFAZolin (ANCEF) IV Stopped (01/08/18 0356)     LOS: 22 days     Cordelia Poche, MD [facility] 01/08/2018, 12:22 PM Pager: [phone]  If 7PM-7AM, please contact  night-coverage [email or web address] 01/08/2018, 12:22 PM

## 2018-01-08 NOTE — Progress Notes (Signed)
Nutrition Follow-up  DOCUMENTATION CODES:   Not applicable  INTERVENTION:    Boost Breeze po TID, each supplement provides 250 kcal and 9 grams of protein  Juven Fruit Punch BID, each serving provides 95kcal and 2.5g of protein (amino acids glutamine and arginine)  NUTRITION DIAGNOSIS:   Increased nutrient needs related to cancer and cancer related treatments, wound healing as evidenced by estimated needs.  Ongoing  GOAL:   Patient will meet greater than or equal to 90% of their needs  Meeting  MONITOR:   PO intake, Supplement acceptance, Weight trends, Labs  REASON FOR ASSESSMENT:   LOS    ASSESSMENT:   Patient with PMH significant for HTN, IBS, and gastric ulcer. Presented to [facility] 7/8 with right foot pain and discoloration. He was found to have left groin infection, PAD with right foot gangrene, and poorly differentiated adenocarcinoma of unclear primary- suspect lung cancer in right upper lobe. Was transferred to [facility] 7/21 to begin palliative radiation treatments for pain control.    7/22- started palliative radiation  Pt applying ted hose to bilateral lower extremities. States "they are trying to save my toes." Discussed the importance of Juven and high protein food options. Meal completions charted as 90-100%. Pt is currently drinking Juven once/day and boost TID. Pt eats "sweets" in the morning and has a meat for lunch/dinner. Suggested pt drink boost with his breakfast for added protein and to increase his Juven intake to BID to maximize added nutrients important for wound healing. Pt amendable. Pt enjoys Marinol has noticed a positive change in appetite. A recent wt has not been obtained since 7/23. Will continue to monitor.   Medications reviewed and include: Marinol BID, decadron  Labs reviewed.   Diet Order:   Diet Order           Diet regular Room service appropriate? Yes; Fluid consistency: Thin  Diet effective now          EDUCATION NEEDS:    Education needs have been addressed  Skin:  Skin Assessment: Skin Integrity Issues: Skin Integrity Issues:: Incisions, Other (Comment) Incisions: neck, left leg Other: toes on right foot demarcating   Last BM:  01/07/18  Height:   Ht Readings from Last 1 Encounters:  01/01/18 6\' 5"  (1.956 m)    Weight:   Wt Readings from Last 1 Encounters:  01/01/18 189 lb 12.8 oz (86.1 kg)    Ideal Body Weight:  94.5 kg  BMI:  Body mass index is 22.51 kg/m.  Estimated Nutritional Needs:   Kcal:  2500-2700 kcal  Protein:  125-135 grams  Fluid:  >2.5 L/day    Mariana Single RD, LDN Clinical Nutrition Pager # - [phone]

## 2018-01-08 NOTE — Progress Notes (Signed)
Pt required 30 mg of dilaudid of 24 hour period from 7/29-7/30. RN attempted to give PO dilaudid in the afternoon and pt had no relief from the medication. Consistently reporting pain level of 9/10 with no relief from dilaudid IV or dilaudid PO. Pt states pain is getting worse in R neck/shoulder area. Will speak with hospitalist/palliative team about pain control this morning.

## 2018-01-08 NOTE — Progress Notes (Signed)
Occupational Therapy Treatment Patient Details Name: Tony [name] MRN: [id number] DOB: 30-Apr-1966 Today's Date: 01/08/2018    History of present illness 52 year old man PMH critical limb ischemia, status post aortobifemoral bypass, right femoral artery to popliteal bypass, status post thrombectomy left limb aVF bypass.  Discharged on Xarelto.  Presented 7/8 with drainage from left groin wound and left below-knee incision.  Admitted by vascular surgery and started on IV antibiotics.  Because of neck pain, further imaging was obtained which revealed spiculated lung mass concerning for malignancy, patient transferred to hospitalist service. Pt found to have metastatic adenocarcinoma.       Follow Up Recommendations  Supervision - Intermittent    Equipment Recommendations  None recommended by OT           Mobility Bed Mobility               General bed mobility comments: did notperform  Transfers                 General transfer comment: did not perform        ADL either performed or assessed with clinical judgement   ADL Overall ADL's : Needs assistance/impaired                                       General ADL Comments: Pt not feeling well this day.  OT noted increased edema RUE.  RUE propped on 2 pillows.  OT reccomended propping on 4 pillows so RUE would be higher to A with edema.  Pt did have on TED hose- but did not arm sleeve on . Pt did not feeling like putting RUE on at this time.  Educated pt and family on elevating RUE at all times     Vision Baseline Vision/History: Wears glasses            Cognition Arousal/Alertness: Awake/alert Behavior During Therapy: WFL for tasks assessed/performed Overall Cognitive Status: Within Functional Limits for tasks assessed                                                     Pertinent Vitals/ Pain       Faces Pain Scale: Hurts little more Pain Location: R neck  Pain  Descriptors / Indicators: Aching;Discomfort;Grimacing Pain Intervention(s): Limited activity within patient's tolerance;Repositioned     Prior Functioning/Environment              Frequency  Min 2X/week        Progress Toward Goals  OT Goals(current goals can now be found in the care plan section)        Plan Discharge plan remains appropriate       AM-PAC PT "6 Clicks" Daily Activity     Outcome Measure                    End of Session    OT Visit Diagnosis: Muscle weakness (generalized) (M62.81)   Activity Tolerance Patient tolerated treatment well   Patient Left in bed;with call bell/phone within reach;with family/visitor present   Nurse Communication Mobility status;Other (comment)        Time: 9629-5284 OT Time Calculation (min): 9 min  Charges: OT General Charges $OT Visit: 1 Visit OT Treatments $  Therapeutic Activity: 8-22 mins     Zandra Lajeunesse, Thereasa Parkin 01/08/2018, 3:11 PM

## 2018-01-08 NOTE — Progress Notes (Addendum)
Daily Progress Note   Patient Name: Tony [name]       Date: 01/08/2018 DOB: 1965/09/07  Age: 52 y.o. MRN#: [id number] Attending Physician: Mariel Aloe, MD Primary Care Physician: [name], MD Admit Date: 12/17/2017  Reason for Consultation/Follow-up: Pain control, psychosocial support  Subjective: [name] appears to be sleeping but awakens as I enter room. He says "my pain is not worse, it's just still the same."   Required total Fentanyl patch 250 mcg/hr + IV dilaudid 28 mg + po dilaudid 80 mg over past 24 hrs.   Length of Stay: 22  Current Medications: Scheduled Meds:  . acetaminophen  1,000 mg Oral TID  . atorvastatin  40 mg Oral Daily  . dexamethasone  4 mg Intravenous TID  . dronabinol  2.5 mg Oral BID AC  . [START ON 01/09/2018] DULoxetine  60 mg Oral Daily  . feeding supplement  1 Container Oral TID BM  . fentaNYL  250 mcg Transdermal Q72H  . fondaparinux (ARIXTRA) injection  7.5 mg Subcutaneous q1800  . gabapentin  400 mg Oral TID  . HYDROmorphone  16 mg Oral TID  . irbesartan  150 mg Oral Daily  . lidocaine  1 patch Transdermal Q24H  . LORazepam  1 mg Oral BID  . LORazepam  1.5 mg Oral QHS  . methocarbamol  1,000 mg Oral QID  . metoprolol succinate  100 mg Oral QHS  . mirtazapine  15 mg Oral QHS  . nutrition supplement (JUVEN)  1 packet Oral BID BM  . ondansetron  4 mg Oral Q6H  . pantoprazole  40 mg Oral Daily  . senna  1 tablet Oral QHS  . sodium chloride flush  3 mL Intravenous Q12H    Continuous Infusions: . sodium chloride    . sodium chloride    .  ceFAZolin (ANCEF) IV Stopped (01/08/18 0356)    PRN Meds: sodium chloride, sodium chloride, alum & mag hydroxide-simeth, bisacodyl, guaiFENesin-dextromethorphan, hydrALAZINE, HYDROmorphone  (DILAUDID) injection, HYDROmorphone, labetalol, LORazepam, naLOXone (NARCAN)  injection, phenol, polyethylene glycol, povidone-iodine, promethazine, sodium chloride flush  Physical Exam  Constitutional: He is oriented to person, place, and time. He appears well-developed.  HENT:  Head: Atraumatic.  Cardiovascular: Normal rate.  Pulmonary/Chest: Effort normal. No accessory muscle usage. No tachypnea. No respiratory distress.  Abdominal: Normal  appearance.  Neurological: He is alert and oriented to person, place, and time.  Nursing note and vitals reviewed.           Vital Signs: BP (!) 150/89 (BP Location: Left Arm)   Pulse 95   Temp 99.2 F (37.3 C) (Oral)   Resp 20   Ht 6\' 5"  (1.956 m)   Wt 86.1 kg (189 lb 12.8 oz)   SpO2 92%   BMI 22.51 kg/m  SpO2: SpO2: 92 % O2 Device: O2 Device: Room Air O2 Flow Rate: O2 Flow Rate (L/min): 2 L/min  Intake/output summary:   Intake/Output Summary (Last 24 hours) at 01/08/2018 1228 Last data filed at 01/08/2018 0800 Gross per 24 hour  Intake 150 ml  Output 425 ml  Net -275 ml   LBM: Last BM Date: 01/07/18 Baseline Weight: Weight: 71.6 kg (157 lb 13.6 oz) Most recent weight: Weight: 86.1 kg (189 lb 12.8 oz)       Palliative Assessment/Data: 50%   Flowsheet Rows     Most Recent Value  Intake Tab  Referral Department  Surgery  Palliative Care Primary Diagnosis  Cancer  Date Notified  12/25/17  Palliative Care Type  New Palliative care  Reason for referral  Clarify Goals of Care, Non-pain Symptom  Date of Admission  12/17/17  Date first seen by Palliative Care  12/26/17  # of days Palliative referral response time  1 Day(s)  # of days IP prior to Palliative referral  8  Clinical Assessment  Psychosocial & Spiritual Assessment  Palliative Care Outcomes      Patient Active Problem List   Diagnosis Date Noted  . Counseling regarding advance care planning and goals of care 01/01/2018  . Adenocarcinoma ([place])   . SVC syndrome  12/31/2017  . Adenocarcinoma of left lung, stage 4 ([place]) 12/31/2017  . Mass in neck   . Neck pain due to malignant neoplastic disease ([place])   . Palliative care by specialist   . Uncontrolled pain   . Palliative care encounter   . Malignant neoplasm of upper lobe of right lung ([place]) 12/25/2017  . Gangrene of right foot ([place]) 12/20/2017  . AKI (acute kidney injury) ([place]) 12/20/2017  . PAD (peripheral artery disease) ([place]) 12/19/2017  . S/P aortobifemoral bypass surgery 12/17/2017  . Lung mass 12/17/2017  . Postoperative wound infection 12/17/2017  . Weight loss 12/17/2017  . Cervical lymphadenopathy 12/17/2017  . DVT (deep venous thrombosis) ([place]) 12/17/2017  . Aortoiliac occlusive disease ([place]) 11/15/2017  . Pain of right lower extremity due to ischemia 11/13/2017  . Hyperlipidemia LDL goal <70 11/13/2017  . PVD (peripheral vascular disease) ([place]) 11/11/2017  . H. pylori infection 10/11/2011  . Duodenal ulcer 08/23/2011  . IBS (irritable bowel syndrome) 08/18/2011  . Depression with anxiety 05/11/2009  . EPIGASTRIC PAIN, CHRONIC 05/11/2009  . SMOKER 09/01/2008  . INSOMNIA, CHRONIC 09/01/2008  . Essential hypertension 09/01/2008  . PALPITATIONS, CHRONIC 09/01/2008  . GASTRITIS 10/24/2007    Palliative Care Assessment & Plan   HPI: 52 y.o. male  with past medical history of HTN, HLD, arterial occlusive disease, PVD, IBS,  chronic back pain, anxiety, depression admitted on 12/17/2017 with increased drainage from incision sites and with critical limb ischemia of left lower extremity. S/P  Right Aortobifemoral bypass and fem-pop bypass 6/6 and found to thrombectomy with fem-pop bypass on left 6/22. Hospitalization complicated by uncontrolled pain likely s/t newly diagnosed metastatic adenocarcinoma with significant lymphadenopathy to neck. Also with venous thrombus of right  subclavian, brachiocephalic, and IJ veins.   Assessment: I spoke with Mikyle again today. He does say that pain is  basically the same. He does wake up stiff and sore so I will space out Robaxin to see if this helps more overnight and into morning. Discussed the changes that I made yesterday. He doesn't believe that the po dilaudid helps him at all. He brings up PCA as this was discussed yesterday and he tells me that he would be willing to try this if he didn't have to have all the gear that goes with it - he is still very frightened by the PCA. After discussion I explained that I am not sure the PCA would still be the best option for him at this point. We did discuss the idea of considering methadone for his pain and he seems more open to this than PCA "I'll try about anything."   Spent time talking with [facility] and he is able to joke and talks about wanting to go home so he can clean and play his video games. He is longing for a feeling of normalcy. Therapeutic listening and emotional support provided.   I have left message with Dr. Irene Limbo regarding potential methadone for pain management. I do believe that outpatient palliative can manage methadone and if not then I believe [facility] could be set up with pain management provider as he is functional.   Recommendations/Plan:  Poor intake: Boost/Breeze TID. Remeron + marinol (would consider d/c marinol). Improved.   Bowel Regimen: Senokot qhs. Having daily BM.   Anxiety: Ativan 1 mg TID + q8h prn. Nighttime dose Ativan po to 1.5 mg.   Right neck pain (r/t cancer/clots):  ? Ice pack 15 min every 4 hours.  ? Tylenol 1000 mg po TID.  ? Dilaudid po 16 mg every 2 hours prn severe pain. Increased scheduled nighttime po dilaudid 16 mg given at 2200, 0200, 0600.  ? Dilaudid IV 1 mg every 1 hour prn breakthrough pain.  ? Gabapentin 400 TID.  ? Lidoderm patch to right neck.  ? Fentanyl patch titrated up to 250 mcg/hr 01/07/18 1700. ? Cymbalta 40 mg daily. D/C Lexapro.  ? Decadron 4 mg IV TID.  ? Continue Robaxin 1000 mg QID - spread out to q6h to receive dosing at night.    ? Unfortunately Dr. Maryjean Ka has no interventional options that I can provide at this time with severity of vasculature. Recommends continued radiation therapy and anticoagulation.  ? D/C scheduled Zofran as no nausea complaints and may consider methadone for pain management if QTc allows. I have order EKG for the am to assess QTc.    Code Status:  Full code  Prognosis:   Overall poor prognosis with stage IV cancer newly diagnosed and gangrene to right foot. Attempt to better manage pain/symptoms to see if functional status may improve.   Discharge Planning:  To Be Determined  Care plan was discussed with RN, Dr. Lonny Prude, Dr. Domingo Cocking.  Thank you for allowing the Palliative Medicine Team to assist in the care of this patient.   Total Time 11min Prolonged Time Billed  no      Greater than 50%  of this time was spent counseling and coordinating care related to the above assessment and plan.  Vinie Sill, NP Palliative Medicine Team Pager # [phone] (M-F 8a-5p) Team Phone # [phone] (Nights/Weekends)

## 2018-01-09 ENCOUNTER — Ambulatory Visit
Admit: 2018-01-09 | Discharge: 2018-01-09 | Disposition: A | Discharge status: Home / Self Care | Payer: BLUE CROSS/BLUE SHIELD | Attending: Radiation Oncology | Admitting: Radiation Oncology

## 2018-01-09 ENCOUNTER — Encounter (HOSPITAL_COMMUNITY): Payer: Self-pay | Admitting: Hematology

## 2018-01-09 DIAGNOSIS — M542 Cervicalgia: Secondary | ICD-10-CM

## 2018-01-09 MED ORDER — SENNOSIDES-DOCUSATE SODIUM 8.6-50 MG PO TABS
2.0000 | ORAL_TABLET | Freq: Two times a day (BID) | ORAL | Status: DC
  Administered 2018-01-09 – 2018-01-23 (×24): 2 via ORAL
  Filled 2018-01-09 (×27): qty 2

## 2018-01-09 MED ORDER — HYDROMORPHONE HCL 1 MG/ML IJ SOLN
2.0000 mg | INTRAMUSCULAR | Status: DC | PRN
  Administered 2018-01-10: 2 mg via INTRAVENOUS
  Administered 2018-01-10: 3 mg via INTRAVENOUS
  Administered 2018-01-10 – 2018-01-11 (×12): 2 mg via INTRAVENOUS
  Filled 2018-01-09 (×2): qty 2
  Filled 2018-01-09: qty 3
  Filled 2018-01-09 (×12): qty 2

## 2018-01-09 MED ORDER — POLYETHYLENE GLYCOL 3350 17 G PO PACK
17.0000 g | PACK | Freq: Every day | ORAL | Status: DC
  Administered 2018-01-10 – 2018-01-23 (×10): 17 g via ORAL
  Filled 2018-01-09 (×13): qty 1

## 2018-01-09 MED ORDER — LACTULOSE 10 GM/15ML PO SOLN
20.0000 g | Freq: Two times a day (BID) | ORAL | Status: DC | PRN
  Filled 2018-01-09: qty 30

## 2018-01-09 MED ORDER — DEXAMETHASONE SODIUM PHOSPHATE 4 MG/ML IJ SOLN
4.0000 mg | Freq: Two times a day (BID) | INTRAMUSCULAR | Status: DC
  Administered 2018-01-10 – 2018-01-16 (×13): 4 mg via INTRAVENOUS
  Filled 2018-01-09 (×14): qty 1

## 2018-01-09 NOTE — Progress Notes (Signed)
PROGRESS NOTE    Tony [name]  WUX:[id number] DOB: Jan 10, 1966 DOA: 12/17/2017 PCP: [name], MD   Brief Narrative: Tony [name] is a 52 y.o. male with a history of PAD s/p fem-fem bypass, thrombectomy, post-op wound infection. Patient with new diagnosis of lung mass concerning for poorly differentiated adenocarcinoma of the lung. Patient is undergoing x-ray therapy and medical oncology planning on starting chemotherapy. Hospitalization complicated by poorly controlled pain.   Assessment & Plan:   Principal Problem:   Malignant neoplasm of upper lobe of right lung ([facility]) Active Problems:   S/P aortobifemoral bypass surgery   Lung mass   Postoperative wound infection   Cervical lymphadenopathy   DVT (deep venous thrombosis) ([facility])   PAD (peripheral artery disease) ([facility])   Gangrene of right foot ([facility])   AKI (acute kidney injury) ([place])   Uncontrolled pain   Palliative care encounter   Neck pain due to malignant neoplastic disease ([place])   Palliative care by specialist   Mass in neck   Adenocarcinoma [facility])   Counseling regarding advance care planning and goals of care   Poorly differentiated adenocarcinoma, lung primary. Metastatic. Cervical, mediastinal and axillary lymphadenopathy. SVC syndrome.  Upper GI series was negative for evidence of malignancy -Oncology/radiation oncology recommendations: planning chemotherapy at some point; preferably once outpatient On anti-coagulation prior to admission, currently on Arixtra, awaiting answer from oncology regarding switching to Xarelto. Radiation oncology consulted for SVC syndrome and patient is currently receiving radiation therapy, today's day 8. Pain control is difficult.  Discussed with oncology to see whether vascular surgery would have any been put and feels that no indication for endovascular surgery were normal right now.  DVT Upper extremity Multiple. Patient on Xarelto as an outpatient. Currently on  Arixtra -Per oncology -Will need plan for transition to oral regimen  Right foot gangrene Plan for outpatient management per vascular surgery. No plan for surgical management inpatient.  Acute pain Secondary to malignancy. Significant. Palliative care on board to help with management. Current regimen has started to help. Possibly complicated by additional diagnosis of cellulitis. Very thankful for PCM management -Continue fentanyl patch, gabapentin, decadron, robaxin per PCM  Anxiety Bipolar disorder -Continue Marinol, ativan, Lexapro  Left groin wound Left leg wound Discussed wound with vascular surgery on 7/26. No recommendations. -Continue dressing changes  Anemia Normocytic. Chronic disease. Stable. Hematology recommending to transfuse for hemoglobin less than 8  Cellulitis In setting of radiation therapy. WBC elevated but is now trended down slightly. Significant tenderness with associated erythema. CT chest significant for no abscess -Blood cultures pending (no growth to date) -Cefazolin   DVT prophylaxis: Arixtra Code Status:   Code Status: Full Code Family Communication: sister and wife at bedside Disposition Plan: Discharge pending better pain control   Consultants:   Palliative care  Vascular surgery  Medical oncology  Procedures:   XRT  Antimicrobials:  Vancomycin (7/8>>7/10)  Zosyn (7/8>> 7/10)  Unasyn (7/11>>7/13)  Augmentin (7/13>>7/21)   Subjective: Continues to have pain, swelling, nausea,no  vomiting.  No abdominal pain but constipated.  Objective: Vitals:   01/09/18 0530 01/09/18 1001 01/09/18 1413 01/09/18 1415  BP: (!) 163/88 (!) 152/83 (!) 171/87 (!) 143/86  Pulse: 92 (!) 104 91 87  Resp: 20 16 16 18   Temp: 98.9 F (37.2 C) 98 F (36.7 C) 98.6 F (37 C)   TempSrc: Oral Oral Oral   SpO2: 93% 95% 97% 94%  Weight:      Height:  Intake/Output Summary (Last 24 hours) at 01/09/2018 1815 Last data filed at 01/09/2018  1527 Gross per 24 hour  Intake 518.89 ml  Output 501 ml  Net 17.89 ml   Filed Weights   12/18/17 1613 01/01/18 1100  Weight: 71.6 kg (157 lb 13.6 oz) 86.1 kg (189 lb 12.8 oz)    Examination:  General exam: Appears calm and comfortable Respiratory system: Respiratory effort normal. Gastrointestinal system: Abdomen is nondistended  Central nervous system: Alert and oriented. No focal neurological deficits. Extremities: Right arm edema. No calf tenderness Skin: No cyanosis. Erythema around right neck and upper chest   Data Reviewed: I have personally reviewed following labs and imaging studies  CBC: Recent Labs  Lab 01/03/18 0451 01/06/18 0350 01/08/18 0350  WBC 14.8* 16.5* 14.1*  HGB 8.4* 8.8* 8.4*  HCT 27.8* 28.7* 27.8*  MCV 93.6 93.5 92.7  PLT 497* 379 151   Basic Metabolic Panel: Recent Labs  Lab 01/03/18 0451 01/06/18 0350 01/08/18 0350  NA 143 143 143  K 4.2 4.3 4.7  CL 107 105 104  CO2 26 28 28   GLUCOSE 141* 148* 126*  BUN 22* 22* 16  CREATININE 0.79 0.68 0.66  CALCIUM 8.5* 8.6* 8.5*  PHOS 2.7  --   --    GFR: Estimated Creatinine Clearance: 136 mL/min (by C-G formula based on SCr of 0.66 mg/dL). Liver Function Tests: No results for input(s): AST, ALT, ALKPHOS, BILITOT, PROT, ALBUMIN in the last 168 hours. No results for input(s): LIPASE, AMYLASE in the last 168 hours. No results for input(s): AMMONIA in the last 168 hours. Coagulation Profile: No results for input(s): INR, PROTIME in the last 168 hours. Cardiac Enzymes: No results for input(s): CKTOTAL, CKMB, CKMBINDEX, TROPONINI in the last 168 hours. BNP (last 3 results) No results for input(s): PROBNP in the last 8760 hours. HbA1C: No results for input(s): HGBA1C in the last 72 hours. CBG: No results for input(s): GLUCAP in the last 168 hours. Lipid Profile: No results for input(s): CHOL, HDL, LDLCALC, TRIG, CHOLHDL, LDLDIRECT in the last 72 hours. Thyroid Function Tests: No results for  input(s): TSH, T4TOTAL, FREET4, T3FREE, THYROIDAB in the last 72 hours. Anemia Panel: No results for input(s): VITAMINB12, FOLATE, FERRITIN, TIBC, IRON, RETICCTPCT in the last 72 hours. Sepsis Labs: No results for input(s): PROCALCITON, LATICACIDVEN in the last 168 hours.  Recent Results (from the past 240 hour(s))  Culture, blood (routine x 2)     Status: None (Preliminary result)   Collection Time: 01/06/18 11:29 AM  Result Value Ref Range Status   Specimen Description   Final    BLOOD LEFT ANTECUBITAL Performed at [facility] [address] [place], [place] [address]    Special Requests   Final    BOTTLES DRAWN AEROBIC ONLY Blood Culture adequate volume Performed at [facility] [address], [place], [place] [address]    Culture   Final    NO GROWTH 3 DAYS Performed at [facility], [place] [address] [place], [place] [address]    Report Status PENDING  Incomplete  Culture, blood (routine x 2)     Status: None (Preliminary result)   Collection Time: 01/06/18 11:30 AM  Result Value Ref Range Status   Specimen Description   Final    BLOOD LEFT WRIST Performed at [facility] [address] [place], [place] [address]    Special Requests   Final    BOTTLES DRAWN AEROBIC ONLY Blood  Culture adequate volume Performed at [facility] [address] [place], [place] [address]    Culture   Final    NO GROWTH 3 DAYS Performed at [facility], [place] [address], [place], [place] [address]    Report Status PENDING  Incomplete         Radiology Studies: No results found.      Scheduled Meds: . acetaminophen  1,000 mg Oral TID  . atorvastatin  40 mg Oral Daily  . dexamethasone  4 mg Intravenous BID  . dronabinol  2.5 mg Oral BID AC  . DULoxetine  60 mg Oral Daily  . feeding supplement  1 Container Oral TID BM  . fentaNYL  250 mcg Transdermal Q72H  . fondaparinux (ARIXTRA)  injection  7.5 mg Subcutaneous q1800  . gabapentin  400 mg Oral TID  . HYDROmorphone  16 mg Oral TID  . irbesartan  150 mg Oral Daily  . lidocaine  1 patch Transdermal Q24H  . LORazepam  1 mg Oral BID  . LORazepam  1.5 mg Oral QHS  . methocarbamol  1,000 mg Oral Q6H  . metoprolol succinate  100 mg Oral QHS  . mirtazapine  15 mg Oral QHS  . nutrition supplement (JUVEN)  1 packet Oral BID BM  . pantoprazole  40 mg Oral Daily  . senna  1 tablet Oral QHS  . sodium chloride flush  3 mL Intravenous Q12H   Continuous Infusions: . sodium chloride Stopped (01/09/18 1113)  . sodium chloride    .  ceFAZolin (ANCEF) IV Stopped (01/09/18 1055)     LOS: 23 days     Berle Mull, MD [facility] 01/09/2018, 6:15 PM  If 7PM-7AM, please contact night-coverage [email or web address] 01/09/2018, 6:15 PM

## 2018-01-09 NOTE — Progress Notes (Signed)
[facility] Therapy Treatment Record Phone [phone]   Radiation Therapy was administered to Tony [name] on: 01/09/2018  12:10 PM and was treatment # 8 out of a planned course of 32 treatments.  Radiation Treatment  1). Beam photons with 6-10 energy  2). Brachytherapy None  3). Stereotactic Radiosurgery None  4). Other Radiation None     Louna Rothgeb F Keyira Mondesir, RT (T)

## 2018-01-09 NOTE — Progress Notes (Signed)
Daily Progress Note   Patient Name: Tony [name]       Date: 01/09/2018 DOB: [birth year]  Age: 52 y.o. MRN#: [id number] Attending Physician: Lavina Hamman, MD Primary Care Physician: [name], MD Admit Date: 12/17/2017  Reason for Consultation/Follow-up: Pain control, psychosocial support  Subjective: Tony [name] is sleeping but briefly arouses and smiles and waves at me.   Required total Fentanyl patch 250 mcg/hr + IV dilaudid 96 mg + po dilaudid 18 mg over past 24 hrs. Pain still far from controlled with current doses.   Length of Stay: 23  Current Medications: Scheduled Meds:  . acetaminophen  1,000 mg Oral TID  . atorvastatin  40 mg Oral Daily  . dexamethasone  4 mg Intravenous TID  . dronabinol  2.5 mg Oral BID AC  . DULoxetine  60 mg Oral Daily  . feeding supplement  1 Container Oral TID BM  . fentaNYL  250 mcg Transdermal Q72H  . fondaparinux (ARIXTRA) injection  7.5 mg Subcutaneous q1800  . gabapentin  400 mg Oral TID  . HYDROmorphone  16 mg Oral TID  . irbesartan  150 mg Oral Daily  . lidocaine  1 patch Transdermal Q24H  . LORazepam  1 mg Oral BID  . LORazepam  1.5 mg Oral QHS  . methocarbamol  1,000 mg Oral Q6H  . metoprolol succinate  100 mg Oral QHS  . mirtazapine  15 mg Oral QHS  . nutrition supplement (JUVEN)  1 packet Oral BID BM  . pantoprazole  40 mg Oral Daily  . senna  1 tablet Oral QHS  . sodium chloride flush  3 mL Intravenous Q12H    Continuous Infusions: . sodium chloride Stopped (01/09/18 1113)  . sodium chloride    .  ceFAZolin (ANCEF) IV Stopped (01/09/18 1055)    PRN Meds: sodium chloride, sodium chloride, alum & mag hydroxide-simeth, bisacodyl, guaiFENesin-dextromethorphan, hydrALAZINE, HYDROmorphone (DILAUDID) injection, HYDROmorphone,  labetalol, LORazepam, naLOXone (NARCAN)  injection, ondansetron, phenol, polyethylene glycol, povidone-iodine, promethazine, sodium chloride flush  Physical Exam  Constitutional: He is oriented to person, place, and time. He appears well-developed.  HENT:  Head: Atraumatic.  Cardiovascular: Normal rate.  Pulmonary/Chest: Effort normal. No accessory muscle usage. No tachypnea. No respiratory distress.  Abdominal: Normal appearance.  Neurological: He is alert and oriented to person, place, and  time.  Nursing note and vitals reviewed.           Vital Signs: BP (!) 143/86 (BP Location: Left Arm)   Pulse 87   Temp 98.6 F (37 C) (Oral)   Resp 18   Ht 6\' 5"  (1.956 m)   Wt 86.1 kg (189 lb 12.8 oz)   SpO2 94%   BMI 22.51 kg/m  SpO2: SpO2: 94 % O2 Device: O2 Device: Room Air O2 Flow Rate: O2 Flow Rate (L/min): 2 L/min  Intake/output summary:   Intake/Output Summary (Last 24 hours) at 01/09/2018 1703 Last data filed at 01/09/2018 1527 Gross per 24 hour  Intake 518.89 ml  Output 501 ml  Net 17.89 ml   LBM: Last BM Date: 01/07/18 Baseline Weight: Weight: 71.6 kg (157 lb 13.6 oz) Most recent weight: Weight: 86.1 kg (189 lb 12.8 oz)       Palliative Assessment/Data: 50%   Flowsheet Rows     Most Recent Value  Intake Tab  Referral Department  Surgery  Palliative Care Primary Diagnosis  Cancer  Date Notified  12/25/17  Palliative Care Type  New Palliative care  Reason for referral  Clarify Goals of Care, Non-pain Symptom  Date of Admission  12/17/17  Date first seen by Palliative Care  12/26/17  # of days Palliative referral response time  1 Day(s)  # of days IP prior to Palliative referral  8  Clinical Assessment  Psychosocial & Spiritual Assessment  Palliative Care Outcomes      Patient Active Problem List   Diagnosis Date Noted  . Counseling regarding advance care planning and goals of care 01/01/2018  . Adenocarcinoma ([place])   . SVC syndrome 12/31/2017  .  Adenocarcinoma of left lung, stage 4 ([place]) 12/31/2017  . Mass in neck   . Neck pain due to malignant neoplastic disease ([place])   . Palliative care by specialist   . Uncontrolled pain   . Palliative care encounter   . Malignant neoplasm of upper lobe of right lung ([place]) 12/25/2017  . Gangrene of right foot ([place]) 12/20/2017  . AKI (acute kidney injury) ([place]) 12/20/2017  . PAD (peripheral artery disease) ([place]) 12/19/2017  . S/P aortobifemoral bypass surgery 12/17/2017  . Lung mass 12/17/2017  . Postoperative wound infection 12/17/2017  . Weight loss 12/17/2017  . Cervical lymphadenopathy 12/17/2017  . DVT (deep venous thrombosis) ([place]) 12/17/2017  . Aortoiliac occlusive disease ([place]) 11/15/2017  . Pain of right lower extremity due to ischemia 11/13/2017  . Hyperlipidemia LDL goal <70 11/13/2017  . PVD (peripheral vascular disease) ([place]) 11/11/2017  . H. pylori infection 10/11/2011  . Duodenal ulcer 08/23/2011  . IBS (irritable bowel syndrome) 08/18/2011  . Depression with anxiety 05/11/2009  . EPIGASTRIC PAIN, CHRONIC 05/11/2009  . SMOKER 09/01/2008  . INSOMNIA, CHRONIC 09/01/2008  . Essential hypertension 09/01/2008  . PALPITATIONS, CHRONIC 09/01/2008  . GASTRITIS 10/24/2007    Palliative Care Assessment & Plan   HPI: 52 y.o. male  with past medical history of HTN, HLD, arterial occlusive disease, PVD, IBS,  chronic back pain, anxiety, depression admitted on 12/17/2017 with increased drainage from incision sites and with critical limb ischemia of left lower extremity. S/P  Right Aortobifemoral bypass and fem-pop bypass 6/6 and found to thrombectomy with fem-pop bypass on left 6/22. Hospitalization complicated by uncontrolled pain likely s/t newly diagnosed metastatic adenocarcinoma with significant lymphadenopathy to neck. Also with venous thrombus of right subclavian, brachiocephalic, and IJ veins.   Assessment: Tony [name] is sleeping but  we did speak briefly when he aroused and he  agrees to try methadone for his pain management. Wife and other family members at bedside.   I spoke mainly with wife who agrees we need to try something different for his pain (he has had a bad day today). She agrees with methadone. I did tell her that I am worried that his pain has been so difficult to get better controlled. We discussed his swelling in extremities and she is concerned about his toes and healing and wondering if vascular needs to stay involved.   Her main request is that she is concerned that he has not yet began chemotherapy. She understands that we were hoping that we could get his pain better controlled prior to chemotherapy but she is interesting in what we can do now to get treatment progressing. I did mention that chemotherapy in itself is going to be difficult on him and can also introduce more issues and symptoms. She nodded her head but says she doesn't want to think about that right now. Emotional support provided.   For now we will 1) pursue options with Dr. Irene Limbo 2) increase IV dilaudid 3) transition to methadone tomorrow.   Recommendations/Plan:  Poor intake: Boost/Breeze TID. Remeron + marinol (would consider d/c marinol). Improved.   Bowel Regimen: Senokot qhs. Having daily BM.   Anxiety: Ativan 1 mg TID + q8h prn. Nighttime dose Ativan po to 1.5 mg.   Right neck pain (r/t cancer/clots):  ? Ice pack 15 min every 4 hours.  ? Tylenol 1000 mg po TID.  ? Dilaudid po 16 mg every 2 hours prn severe pain. Increased scheduled nighttime po dilaudid 16 mg given at 2200, 0200, 0600.  ? Dilaudid IV increased from 1-2 to 2-3 mg every 1 hour prn breakthrough pain.  ? Gabapentin 400 TID.  ? Lidoderm patch to right neck.  ? Fentanyl patch titrated up to 250 mcg/hr 01/07/18.  ? Cymbalta 40 mg daily. D/C Lexapro.  ? Decadron 4 mg IV TID.  ? Continue Robaxin 1000 mg QID - spread out to q6h to receive dosing at night.  ? Unfortunately Dr. Maryjean Ka has no interventional options  that I can provide at this time with severity of vasculature. Recommends continued radiation therapy and anticoagulation.  ? Will start methadone tomorrow.    Code Status:  Full code  Prognosis:   Overall poor prognosis with stage IV cancer newly diagnosed and gangrene to right foot. Attempt to better manage pain/symptoms to see if functional status may improve.   Discharge Planning:  To Be Determined  Care plan was discussed with wife, Dr. Posey Pronto, Dr. Domingo Cocking.  Thank you for allowing the Palliative Medicine Team to assist in the care of this patient.   Total Time 45 min Prolonged Time Billed  no      Greater than 50%  of this time was spent counseling and coordinating care related to the above assessment and plan.  Vinie Sill, NP Palliative Medicine Team Pager # [phone] (M-F 8a-5p) Team Phone # [phone] (Nights/Weekends)

## 2018-01-09 NOTE — Progress Notes (Signed)
Mar[facility] Kitchen   HEMATOLOGY/ONCOLOGY INPATIENT PROGRESS NOTE  Date of Service: 01/09/2018  Inpatient Attending: .Lavina Hamman, MD   SUBJECTIVE  Patient was seen in followup for newly diagnosed metastatic poorly differentiated adenocarcinoma likely of lung primary.  He continues to be in the hospital and is having difficulty with discharging due to his pain management needs. Receiving palliative RT for pain control. Noted to have significant erythema and induration over the neck more on the rt sde and rt chest wall. Concern for radiation related skin toxicity and being worked up and empirically treated for a cellulitis. BL cx neg thus far. No fevers. Patient notes pain control improved but some episodes of uncontrolled pain esp in the morning and now with skin discomfort over the area of erythema. He notes significant improvement in her RUE swelling. Notes reasonable appetite.   OBJECTIVE:  NAD PHYSICAL EXAMINATION: . Vitals:   01/09/18 0530 01/09/18 1001 01/09/18 1413 01/09/18 1415  BP: (!) 163/88 (!) 152/83 (!) 171/87 (!) 143/86  Pulse: 92 (!) 104 91 87  Resp: 20 16 16 18   Temp: 98.9 F (37.2 C) 98 F (36.7 C) 98.6 F (37 C)   TempSrc: Oral Oral Oral   SpO2: 93% 95% 97% 94%  Weight:      Height:       Filed Weights   12/18/17 1613 01/01/18 1100  Weight: 157 lb 13.6 oz (71.6 kg) 189 lb 12.8 oz (86.1 kg)   .Body mass index is 22.51 kg/m.  GENERAL:alert, in no acute distress and comfortable SKIN: no acute rashes, no significant lesions EYES: conjunctiva are pink and non-injected, sclera anicteric OROPHARYNX: MMM, no exudates, no oropharyngeal erythema or ulceration NECK:  Still with b/l cervical LNadenopathy r>>L, significant erythema over the neck , supraclavicular area and chest wall induration.  LYMPH:  Palpable b/l cervical LNadenopathy LUNGS: clear to auscultation b/l with normal respiratory effort HEART: regular rate & rhythm ABDOMEN:  normoactive bowel sounds , non  tender, not distended. Extremity: RUE swelling 2+, minimal LUE swelling. B/l LE swelling 2+. Rt foot gangrenous changes. Rt groin wound healed with no discharge at this time. PSYCH: alert & oriented x 3 with fluent speech NEURO: no focal motor/sensory deficits   MEDICAL HISTORY:  Past Medical History:  Diagnosis Date  . Anxiety   . Arterial occlusive disease    multilevel arterial occlusive disease/notes 11/13/2017  . Arthritis    "left knee" (11/13/2017)  . Chronic back pain   . Chronic lower back pain   . Depression   . Gastric ulcer due to Helicobacter pylori 1638  . GERD (gastroesophageal reflux disease)   . High cholesterol   . Hypertension   . IBS (irritable bowel syndrome)   . Mesenteric adenitis 2011   presumed/H&P  . Migraines    "use to suffer migraines years ago" (11/13/2017)  . Peripheral vascular disease ([place])   . Pneumonia 2011  . Ulcer     SURGICAL HISTORY: Past Surgical History:  Procedure Laterality Date  . AORTA - BILATERAL FEMORAL ARTERY BYPASS GRAFT Bilateral 11/15/2017   Procedure: AORTA BIFEMORAL BYPASS GRAFT;  Surgeon: Angelia Mould, MD;  Location: [facility];  Service: Vascular;  Laterality: Bilateral;  . BACK SURGERY  X 4   "procedure where they burn the nerves every 6 months"  . EMBOLECTOMY Left 12/01/2017   Procedure: THROMBECTOMY OF LEFT AORTAFEMORAL BYPASS, THROMBECTOMY OF TIBIAL ARTERY;  Surgeon: Rosetta Posner, MD;  Location: [facility];  Service: Vascular;  Laterality: Left;  .  ESOPHAGOGASTRODUODENOSCOPY  08/22/2011   Procedure: ESOPHAGOGASTRODUODENOSCOPY (EGD);  Surgeon: [name], MD;  Location: [facility];  Service: Gastroenterology;  Laterality: N/A;  . FEMORAL-POPLITEAL BYPASS GRAFT Right 11/15/2017   Procedure: Right FEMORAL-Above knee POPLITEAL ARTERY Bypass Graft using nonreversed saphenous vein ;  Surgeon: Angelia Mould, MD;  Location: [facility];  Service: Vascular;  Laterality: Right;  . FEMORAL-POPLITEAL BYPASS GRAFT Left 12/01/2017    Procedure: BYPASS GRAFT FEMORAL- ABOVE KNEE POPLITEAL ARTERY WITH VEIN;  Surgeon: Rosetta Posner, MD;  Location: [facility];  Service: Vascular;  Laterality: Left;  . INTRAOPERATIVE ARTERIOGRAM Right 11/15/2017   Procedure: INTRA OPERATIVE ARTERIOGRAM;  Surgeon: Angelia Mould, MD;  Location: [facility];  Service: Vascular;  Laterality: Right;  . INTRAOPERATIVE ARTERIOGRAM Left 12/01/2017   Procedure: INTRA OPERATIVE ARTERIOGRAM OF LEFT LEG;  Surgeon: Rosetta Posner, MD;  Location: [facility];  Service: Vascular;  Laterality: Left;  . IR US GUIDE BX ASP/DRAIN  12/18/2017  . KNEE ARTHROSCOPY Left X 5    SOCIAL HISTORY: Social History   Socioeconomic History  . Marital status: Married    Spouse name: Not on file  . Number of children: 3  . Years of education: Not on file  . Highest education level: Not on file  Occupational History  . Occupation: Admin. Assistant    Employer: [employer]  Social Needs  . Financial resource strain: Not on file  . Food insecurity:    Worry: Not on file    Inability: Not on file  . Transportation needs:    Medical: Not on file    Non-medical: Not on file  Tobacco Use  . Smoking status: Former Smoker    Packs/day: 1.00    Years: 24.00    Pack years: 24.00    Types: Cigarettes    Start date: 11/28/2017    Last attempt to quit: 12/04/2017    Years since quitting: 0.0  . Smokeless tobacco: Never Used  Substance and Sexual Activity  . Alcohol use: Yes    Comment: 11/13/2017 "did drink wine q  1-2 months; nothing anymore"   . Drug use: Not Currently    Types: Marijuana    Comment: 11/13/2017  "last drug use was in ~ 1987 "  . Sexual activity: Not on file  Lifestyle  . Physical activity:    Days per week: Not on file    Minutes per session: Not on file  . Stress: Not on file  Relationships  . Social connections:    Talks on phone: Not on file    Gets together: Not on file    Attends religious service: Not on file    Active member of club or organization:  Not on file    Attends meetings of clubs or organizations: Not on file    Relationship status: Not on file  . Intimate partner violence:    Fear of current or ex partner: Not on file    Emotionally abused: Not on file    Physically abused: Not on file    Forced sexual activity: Not on file  Other Topics Concern  . Not on file  Social History Narrative   Lives with wife and two children.  Administrator at [employer]: Family History  Problem Relation Age of Onset  . Hypertension Mother   . Hypertension Father   . Coronary artery disease Father 68  . Heart attack Brother 42  . Coronary artery disease Brother   .  Diabetes Neg Hx   . Stroke Neg Hx     ALLERGIES:  is allergic to pork-derived products; shellfish-derived products; and morphine and related.  MEDICATIONS:  Scheduled Meds: . acetaminophen  1,000 mg Oral TID  . atorvastatin  40 mg Oral Daily  . dexamethasone  4 mg Intravenous TID  . dronabinol  2.5 mg Oral BID AC  . DULoxetine  60 mg Oral Daily  . feeding supplement  1 Container Oral TID BM  . fentaNYL  250 mcg Transdermal Q72H  . fondaparinux (ARIXTRA) injection  7.5 mg Subcutaneous q1800  . gabapentin  400 mg Oral TID  . HYDROmorphone  16 mg Oral TID  . irbesartan  150 mg Oral Daily  . lidocaine  1 patch Transdermal Q24H  . LORazepam  1 mg Oral BID  . LORazepam  1.5 mg Oral QHS  . methocarbamol  1,000 mg Oral Q6H  . metoprolol succinate  100 mg Oral QHS  . mirtazapine  15 mg Oral QHS  . nutrition supplement (JUVEN)  1 packet Oral BID BM  . pantoprazole  40 mg Oral Daily  . senna  1 tablet Oral QHS  . sodium chloride flush  3 mL Intravenous Q12H   Continuous Infusions: . sodium chloride Stopped (01/09/18 1113)  . sodium chloride    .  ceFAZolin (ANCEF) IV Stopped (01/09/18 1055)   PRN Meds:.sodium chloride, sodium chloride, alum & mag hydroxide-simeth, bisacodyl, guaiFENesin-dextromethorphan, hydrALAZINE, HYDROmorphone (DILAUDID) injection,  HYDROmorphone, labetalol, LORazepam, naLOXone (NARCAN)  injection, ondansetron, phenol, polyethylene glycol, povidone-iodine, promethazine, sodium chloride flush  REVIEW OF SYSTEMS:    10 Point review of Systems was done is negative except as noted above.   LABORATORY DATA:  I have reviewed the data as listed  . CBC Latest Ref Rng & Units 01/08/2018 01/06/2018 01/03/2018  WBC 4.0 - 10.5 K/uL 14.1(H) 16.5(H) 14.8(H)  Hemoglobin 13.0 - 17.0 g/dL 8.4(L) 8.8(L) 8.4(L)  Hematocrit 39.0 - 52.0 % 27.8(L) 28.7(L) 27.8(L)  Platelets 150 - 400 K/uL 342 379 497(H)   . CBC    Component Value Date/Time   WBC 14.1 (H) 01/08/2018 0350   RBC 3.00 (L) 01/08/2018 0350   HGB 8.4 (L) 01/08/2018 0350   HCT 27.8 (L) 01/08/2018 0350   PLT 342 01/08/2018 0350   MCV 92.7 01/08/2018 0350   MCH 28.0 01/08/2018 0350   MCHC 30.2 01/08/2018 0350   RDW 16.2 (H) 01/08/2018 0350   LYMPHSABS 0.6 (L) 01/01/2018 0434   MONOABS 0.7 01/01/2018 0434   EOSABS 0.0 01/01/2018 0434   BASOSABS 0.0 01/01/2018 0434    CMP Latest Ref Rng & Units 01/08/2018 01/06/2018 01/03/2018  Glucose 70 - 99 mg/dL 126(H) 148(H) 141(H)  BUN 6 - 20 mg/dL 16 22(H) 22(H)  Creatinine 0.61 - 1.24 mg/dL 0.66 0.68 0.79  Sodium 135 - 145 mmol/L 143 143 143  Potassium 3.5 - 5.1 mmol/L 4.7 4.3 4.2  Chloride 98 - 111 mmol/L 104 105 107  CO2 22 - 32 mmol/L 28 28 26   Calcium 8.9 - 10.3 mg/dL 8.5(L) 8.6(L) 8.5(L)  Total Protein 6.5 - 8.1 g/dL - - -  Total Bilirubin 0.3 - 1.2 mg/dL - - -  Alkaline Phos 38 - 126 U/L - - -  AST 15 - 41 U/L - - -  ALT 0 - 44 U/L - - -    RADIOGRAPHIC STUDIES: I have personally reviewed the radiological images as listed and agreed with the findings in the report. Ct Soft Tissue Neck W  Contrast  Result Date: 12/17/2017 CLINICAL DATA:  52 y/o M; recently removed right neck PICC line with swelling and pain in the region of PICC line. EXAM: CT NECK WITH CONTRAST TECHNIQUE: Multidetector CT imaging of the neck was  performed using the standard protocol following the bolus administration of intravenous contrast. CONTRAST:  100 cc Omnipaque 300 COMPARISON:  None. FINDINGS: Pharynx and larynx: Normal. No mass or swelling. Salivary glands: No inflammation, mass, or stone. Thyroid: Normal. Lymph nodes: Right cervical, bilateral supraclavicular, and upper mediastinal bulky lymphadenopathy. A right upper paratracheal lymph node measures 5.0 x 3.5 cm (series 3, image 109), left supraclavicular node measures 3.1 x 1.7 cm (series 3, image 85), and a right supraclavicular node measures 1.6 x 2.1 cm (series 3, image 68). Vascular: Deep venous thrombosis involving right subclavian vein, right brachiocephalic vein, and right internal jugular vein. Limited intracranial: Negative. Visualized orbits: Negative. Mastoids and visualized paranasal sinuses: Small right maxillary sinus mucous retention cyst. Skeleton: No acute or aggressive process. Upper chest: Spiculated nodule in the right upper lobe measuring 16 x 12 mm (series 3, image 115). Other: Mild edema in the right supraclavicular fossa and lower neck. IMPRESSION: 1. Deep venous thrombosis involving the right subclavian vein, right brachiocephalic vein, and right internal jugular vein. 2. Edema in the right lower neck and supraclavicular fossa is probably related to venous congestion from DVT and/or phlebitis. No abscess. 3. Severe bulky lymphadenopathy of right cervical, bilateral supraclavicular, and upper mediastinal lymph nodes likely representing metastatic disease or lymphoproliferative disease. 4. Spiculated right upper lobe nodule measuring up to 16 mm. Consider one of the following in 3 months for both low-risk and high-risk individuals: (a) repeat chest CT, (b) follow-up PET-CT, or (c) tissue sampling. This recommendation follows the consensus statement: Guidelines for Management of Incidental Pulmonary Nodules Detected on CT Images: From the Fleischner Society 2017; Radiology  2017; 284:228-243. These results will be called to the ordering clinician or representative by the Radiologist Assistant, and communication documented in the PACS or zVision Dashboard. Electronically Signed   By: Kristine Garbe M.D.   On: 12/17/2017 15:04   Ct Chest W Contrast  Result Date: 01/07/2018 CLINICAL DATA:  Newly diagnosed stage IV cancer. Swelling in right chest. Swelling in neck. Question abscess of chest. Chest cellulitis. EXAM: CT CHEST WITH CONTRAST TECHNIQUE: Multidetector CT imaging of the chest was performed during intravenous contrast administration. CONTRAST:  46mL OMNIPAQUE IOHEXOL 300 MG/ML  SOLN COMPARISON:  CT of the chest December 24, 2017 FINDINGS: Cardiovascular: The thoracic aorta is nonaneurysmal with no dissection. No coronary artery calcifications. The heart is normal. The main pulmonary artery is normal in caliber. The right hilar adenopathy exerts mass effect on the upper lobe pulmonary artery on the right without obvious occlusion on limited visualization. No definitive pulmonary emboli. Mediastinum/Nodes: Bulky adenopathy is seen in the base of neck bilaterally. A representative node in the left base of neck on series 2, image 9 measures 2.2 cm today versus 2.0 cm previously. Adenopathy in the right base of neck persists. The representative node to the right of the trachea on series 2, image 22 measures 1.9 cm on both studies. The adenopathy in the right neck may be bulkier but is difficult to compare due to difference in slice selection. There is deviation of the larynx to the left which is slightly more pronounced without airway narrowing. A right paratracheal node in the upper mediastinum measures 4 cm today versus 3.6 cm previously. Adenopathy involves the subcarinal region,  the precarinal region, the prevascular space, and the right hilum. No effusions. The esophagus is normal. The central SVC remains patent. There is poor opacification of the right subclavian vein,  right brachiocephalic vein, and right internal jugular vein, consistent with known DVT seen on previous imaging. Significant edema in the chest wall diffusely. Skin thickening. No abscess identified in the chest wall. Bulky adenopathy again noted in the right axilla more prominent the interval. A representative node in the right axilla on series 2, image 66 measures 1.5 cm today versus 1.2 cm previously. Bulky adenopathy in the right retropectoral region appears more prominent as well. No axillary adenopathy on the left. No adenopathy in the left retropectoral region. Lungs/Pleura: The spiculated nodule in the right upper lobe measures 1.4 by 2.4 by 1.3 cm today, unchanged in the interval. Minimal ground-glass in the left apex may represent minimal infectious or inflammatory change. No other nodules or masses. No other changes. Mild paraseptal emphysematous changes in the apices. Central airways are normal. No pneumothorax. Upper Abdomen: No acute abnormality. Musculoskeletal: No chest wall abnormality. No acute or significant osseous findings. IMPRESSION: 1. The known spiculated mass in the right upper lobe is stable in size. Bulky adenopathy in the cervical lymph node chains, right greater than left; the mediastinum; the right axilla; the right retropectoral region; and the right hilum is overall worsened in the interval. 2. Poor enhancement in the right internal jugular, brachiocephalic, and subclavian veins consistent with previously reported DVT in his locations. 3. Severe edema in the chest wall with no abscess identified. 4. Mass effect on the right upper lobe pulmonary artery due to right hilar adenopathy without complete occlusion. No obvious DVT on limited images. Emphysema (ICD10-J43.9). Electronically Signed   By: Dorise Bullion III M.D   On: 01/07/2018 13:52   Ct Chest W Contrast  Result Date: 12/25/2017 CLINICAL DATA:  Spiculated lung mass and adenopathy shown on neck CT, for further workup. EXAM:  CT CHEST, ABDOMEN, AND PELVIS WITH CONTRAST TECHNIQUE: Multidetector CT imaging of the chest, abdomen and pelvis was performed following the standard protocol during bolus administration of intravenous contrast. CONTRAST:  173mL OMNIPAQUE IOHEXOL 300 MG/ML  SOLN COMPARISON:  12/17/2017 and CT abdomen from 08/14/2011 there is some mild fatty infiltration in segment IV adjacent to the falciform ligament. Otherwise unremarkable. FINDINGS: CT CHEST FINDINGS Cardiovascular: Aortic arch and branch vessel atherosclerotic vascular disease. Poor visualization of contrast in the right internal jugular vein, compatible with patient's known venous thrombosis. Mediastinum/Nodes: Bilateral lower neck level IV adenopathy with ill definition. A left level Roman numeral 4 lymph node measures 2.0 cm in short axis on image 2/3. There is also bilateral supraclavicular adenopathy including a 1.9 cm right-sided supraclavicular node on image 7/3. Bulky paratracheal, prevascular, subcarinal, and right hilar adenopathy noted. An index right paratracheal node measures 3.6 cm in short axis on image 16/3. Enlarged right subpectoral lymph nodes are present along with scattered right axillary lymph nodes. Mild stranding in the anterior mediastinum. Lungs/Pleura: Moderate right and small left pleural effusion are present and are nonspecific for transudative versus exudative etiology. A spiculated right upper lobe nodule measures 2.4 by 1.3 by 1.4 cm, roughly stable from 12/17/2017. There is passive atelectasis in both lower lobes, right greater than left. Paraseptal emphysema noted. Musculoskeletal: Notable subcutaneous edema throughout the chest, somewhat confluent along the right upper chest. CT ABDOMEN PELVIS FINDINGS Hepatobiliary: Unremarkable Pancreas: Unremarkable Spleen: Unremarkable Adrenals/Urinary Tract: Unremarkable Stomach/Bowel: Air-fluid levels in the rectum indicating diarrheal process. Borderline  wall thickening several segments  of proximal small bowel. Vascular/Lymphatic: Aortoiliac atherosclerotic vascular disease. Aortobifemoral graft small fluid collection superficial to the right groin likely representing residual hematoma from prior groin puncture, but only measuring about 2.4 by 2.1 by 4.3 cm (volume = 11 cm^3). No gas in this collection. Left inguinal lymph node 0.9 cm in short axis. Reproductive: Unremarkable Other: Diffuse subcutaneous edema. Small amount of pelvic ascites. Gas tracking along the anterior abdominal wall subcutaneous tissues, correlate with any anterior abdominal wall injections. Subcutaneous edema extends along the pubis and towards the penis. Musculoskeletal: Unremarkable IMPRESSION: 1. Bulky adenopathy in the neck and upper chest. Persistent 2.4 cm spiculated right upper lobe nodule. The degree of adenopathy seems disproportionate to the size of the nodule, and this could represent adenopathy related to lung cancer or potentially 2 separate processes such as lymphoproliferative disease and a separate primary lung cancer. An infectious cause for the pulmonary nodule is less likely. 2. Extensive third spacing of fluid in the chest, abdomen and pelvis. Query hypoproteinemia or hypoalbuminemia. 3. Poor visualization of contrast in the right internal jugular vein compatible with the patient's known venous thrombosis. 4. Air fluid levels in the rectum indicating diarrheal process. Borderline wall thickening in the proximal small bowel, query proximal enteritis. 5. The small fluid collection superficial to the right groin puncture site is only about 11 cc in volume, and probably reflects a resolving hematoma. 6. Gas tracking along anterior abdominal wall subcutaneous tissues, probably from injections-correlate with any anterior abdominal wall subcutaneous injections recently. Electronically Signed   By: Van Clines M.D.   On: 12/25/2017 00:05   Mr Jeri Cos UV Contrast  Result Date: 12/25/2017 CLINICAL DATA:   52 year old male with right upper extremity and right central venous DVT with bulky cervical and thoracic lymphadenopathy and spiculated right apical lung nodule. EXAM: MRI HEAD WITHOUT AND WITH CONTRAST TECHNIQUE: Multiplanar, multiecho pulse sequences of the brain and surrounding structures were obtained without and with intravenous contrast. CONTRAST:  35mL MULTIHANCE GADOBENATE DIMEGLUMINE 529 MG/ML IV SOLN COMPARISON:  Neck CT 12/17/2017. Head CT without contrast 08/03/2006. Neck and chest CT FINDINGS: Brain: No midline shift, mass effect, or evidence of intracranial mass lesion. No abnormal enhancement identified. No dural thickening. No restricted diffusion to suggest acute infarction. No ventriculomegaly, extra-axial collection or acute intracranial hemorrhage. Cervicomedullary junction and pituitary are within normal limits. [place] Cables and white matter signal is within normal limits for age throughout the brain; Minimal nonspecific left frontal lobe white matter T2 and FLAIR hyperintensity (series 6, image 19). No cortical encephalomalacia or chronic cerebral blood products. Vascular: Major intracranial vascular flow voids are preserved. The major dural venous sinuses are enhancing and appear patent. The right IJ bulb at the skull base is patent. Skull and upper cervical spine: Negative visible cervical spine and spinal cord. Visualized bone marrow signal is within normal limits. Sinuses/Orbits: Negative orbits soft tissues. Paranasal sinuses and mastoids are stable and well pneumatized. Other: Visible internal auditory structures appear normal. Partially visible bulky right neck lymphadenopathy (series 4, image 14). IMPRESSION: 1. No cerebral metastatic disease or acute intracranial abnormality identified. 2. Right cervical lymphadenopathy redemonstrated. Electronically Signed   By: Genevie Ann M.D.   On: 12/25/2017 09:17   Ct Abdomen Pelvis W Contrast  Result Date: 12/25/2017 CLINICAL DATA:  Spiculated lung  mass and adenopathy shown on neck CT, for further workup. EXAM: CT CHEST, ABDOMEN, AND PELVIS WITH CONTRAST TECHNIQUE: Multidetector CT imaging of the chest, abdomen and pelvis was performed  following the standard protocol during bolus administration of intravenous contrast. CONTRAST:  112mL OMNIPAQUE IOHEXOL 300 MG/ML  SOLN COMPARISON:  12/17/2017 and CT abdomen from 08/14/2011 there is some mild fatty infiltration in segment IV adjacent to the falciform ligament. Otherwise unremarkable. FINDINGS: CT CHEST FINDINGS Cardiovascular: Aortic arch and branch vessel atherosclerotic vascular disease. Poor visualization of contrast in the right internal jugular vein, compatible with patient's known venous thrombosis. Mediastinum/Nodes: Bilateral lower neck level IV adenopathy with ill definition. A left level Roman numeral 4 lymph node measures 2.0 cm in short axis on image 2/3. There is also bilateral supraclavicular adenopathy including a 1.9 cm right-sided supraclavicular node on image 7/3. Bulky paratracheal, prevascular, subcarinal, and right hilar adenopathy noted. An index right paratracheal node measures 3.6 cm in short axis on image 16/3. Enlarged right subpectoral lymph nodes are present along with scattered right axillary lymph nodes. Mild stranding in the anterior mediastinum. Lungs/Pleura: Moderate right and small left pleural effusion are present and are nonspecific for transudative versus exudative etiology. A spiculated right upper lobe nodule measures 2.4 by 1.3 by 1.4 cm, roughly stable from 12/17/2017. There is passive atelectasis in both lower lobes, right greater than left. Paraseptal emphysema noted. Musculoskeletal: Notable subcutaneous edema throughout the chest, somewhat confluent along the right upper chest. CT ABDOMEN PELVIS FINDINGS Hepatobiliary: Unremarkable Pancreas: Unremarkable Spleen: Unremarkable Adrenals/Urinary Tract: Unremarkable Stomach/Bowel: Air-fluid levels in the rectum indicating  diarrheal process. Borderline wall thickening several segments of proximal small bowel. Vascular/Lymphatic: Aortoiliac atherosclerotic vascular disease. Aortobifemoral graft small fluid collection superficial to the right groin likely representing residual hematoma from prior groin puncture, but only measuring about 2.4 by 2.1 by 4.3 cm (volume = 11 cm^3). No gas in this collection. Left inguinal lymph node 0.9 cm in short axis. Reproductive: Unremarkable Other: Diffuse subcutaneous edema. Small amount of pelvic ascites. Gas tracking along the anterior abdominal wall subcutaneous tissues, correlate with any anterior abdominal wall injections. Subcutaneous edema extends along the pubis and towards the penis. Musculoskeletal: Unremarkable IMPRESSION: 1. Bulky adenopathy in the neck and upper chest. Persistent 2.4 cm spiculated right upper lobe nodule. The degree of adenopathy seems disproportionate to the size of the nodule, and this could represent adenopathy related to lung cancer or potentially 2 separate processes such as lymphoproliferative disease and a separate primary lung cancer. An infectious cause for the pulmonary nodule is less likely. 2. Extensive third spacing of fluid in the chest, abdomen and pelvis. Query hypoproteinemia or hypoalbuminemia. 3. Poor visualization of contrast in the right internal jugular vein compatible with the patient's known venous thrombosis. 4. Air fluid levels in the rectum indicating diarrheal process. Borderline wall thickening in the proximal small bowel, query proximal enteritis. 5. The small fluid collection superficial to the right groin puncture site is only about 11 cc in volume, and probably reflects a resolving hematoma. 6. Gas tracking along anterior abdominal wall subcutaneous tissues, probably from injections-correlate with any anterior abdominal wall subcutaneous injections recently. Electronically Signed   By: Van Clines M.D.   On: 12/25/2017 00:05   Ir  US Guide Bx Asp/drain  Result Date: 12/18/2017 INDICATION: 52 year old male with a history of neck mass EXAM: IR ULTRASOUND GUIDANCE MEDICATIONS: None. ANESTHESIA/SEDATION: Moderate (conscious) sedation was employed during this procedure. A total of Versed 2.0 mg and Fentanyl 100 mcg was administered intravenously. Moderate Sedation Time: 12 minutes. The patient's level of consciousness and vital signs were monitored continuously by radiology nursing throughout the procedure under my direct supervision. FLUOROSCOPY TIME:  None COMPLICATIONS: None PROCEDURE: Informed written consent was obtained from the patient after a thorough discussion of the procedural risks, benefits and alternatives. All questions were addressed. Maximal Sterile Barrier Technique was utilized including caps, mask, sterile gowns, sterile gloves, sterile drape, hand hygiene and skin antiseptic. A timeout was performed prior to the initiation of the procedure. Patient positioned supine position on the ultrasound table. Ultrasound images of the right neck were performed with images stored sent to PACs. Patient is then prepped and draped in the usual sterile fashion. 1% lidocaine was used for local anesthesia. Using ultrasound guidance, multiple 18 gauge core biopsy performed of right neck mass. Tissue specimen placed in the saline. Sterile bandage was placed. Patient tolerated the procedure well and remained hemodynamically stable throughout. No complications were encountered and no significant blood loss. IMPRESSION: Status post ultrasound-guided biopsy of right neck mass. Signed, Dulcy Fanny. Dellia Nims, RPVI [facility] Radiology Electronically Signed   By: Corrie Mckusick D.O.   On: 12/18/2017 17:00   Dg Duanne Limerick W/small Bowel  Result Date: 12/28/2017 CLINICAL DATA:  Metastatic adenocarcinoma of unknown primary. EXAM: UPPER GI SERIES WITH SMALL BOWEL FOLLOW-THROUGH FLUOROSCOPY TIME:  Fluoroscopy Time:   3 minutes 6 seconds TECHNIQUE: Combined double contrast and single contrast upper GI series using effervescent crystals, thick barium, and thin barium. Subsequently, serial images of the small bowel were obtained including spot views of the terminal ileum. COMPARISON:  CT scan dated 12/24/2017 FINDINGS: The esophagus appears normal. The fundus, body, and antrum of the stomach are normal. Pylorus is normal. There is slight edema of the mucosa of the duodenal bulb but the bulb expands and contracts without a mass. The jejunum and ileum appear normal including the terminal ileum. Small bowel transit time was 50 minutes, normal. IMPRESSION: Slight edema of the mucosa of the duodenal bulb. Otherwise normal upper GI and small-bowel follow-through. No visible mass lesions in the stomach or small bowel. Electronically Signed   By: Lorriane Shire M.D.   On: 12/28/2017 09:47    ASSESSMENT & PLAN:    52 yo male with   1. Right upper lobe nodule and extensive mediastinal and cervical lymphadenopathy due to Metastatic lung adenocarcinoma -Biopsy consistent with poorly differentiated adenocarcinoma likely lung ( no imaging evidence of primary tumor other than lung cancer) Foundation One results reviewed - targetable lung cancer mutation.  CT abd no overt disease. MRI brain neg for metastatic disease UGI series- no evidence of UGI malignancy  2. Metastatic cancer related hypercoagulable status   3. Extensive Deep venous thrombosis involving the right subclavian vein, right brachiocephalic vein, and right internal jugular vein - due to cancer + vascular surgery + surgical site infection. -improved RUE swelling.  4. Rt proximal upper venous compression by bulky mediastinal tumor with impending SVC syndrome.  4. PAD with recent vascular bypass and rt foot gangrene. Rt grion infection -resolved s/p antibiotics.  5. Cancer related pain --- mx by palliative care  6. Neck and rt upper chest wall erythema  and induration -- likely primarily due to radiation related skin toxicity. Being treated empirically for cellulitis with cefazolin. BL cx x 2 neg to date.  PLAN -discussed available results -continue palliative RT with an eye on radiation related skin toxicity  -reviewed foundation One results - no overt targetable lung adenocarcinoma mutation. -on empiric abx for chest wall cellulitis per hospitalist. -concern for starting concurrent chemotherapy with carboplatin/taxol at this time due to possiiblity of chest wall cellulitis  and significant radiation related skin erythema - no overt ulceration at this time. -rad onc to weight in regarding skin toxicity mx and adjustment of RT as per their recommendations. -will hold off on palliative chemotherapy till early next week to determine trend of his chest wall skin changes. -on fondaparinux for anticoagulation for cancer related DVT-- will need to continue. -weaning dexamethasone down to 4mg  po BID x 3 days then 4mg  po daily x 5 days then stop. -palliative care following for ongoing pain mx  5. Left groin would infection post-op - resolved  6. Anemia due to metastatic cancer, blood loss from recent surgery. Plan -transfuse prn for hgb <8 to tolerate tx and for wound healing  . The total time spent in the appointment was 35 minutes and more than 50% was on counseling and direct patient cares.     Sullivan Lone MD [place] AAHIVMS [facility] Hematology/Oncology Physician [facility]  (Office):       [phone] (Work cell):  [phone] (Fax):           [phone]  01/09/2018 6:05 PM

## 2018-01-09 NOTE — Progress Notes (Signed)
PT Cancellation Note  Patient Details Name: Tony [name] MRN: [id number] DOB: [birth year]   Cancelled Treatment:     pt stated he was unable to amb in hallway with several reasons.  "my foot is too swollen".  "It hurts too much for me to walk"  Pt did stand at EOB independently marching in place to demonstrate he can move.  Radiation arrive to take pt downstairs.     Rica Koyanagi  PTA [facility]  Acute  Rehab Pager      [phone]

## 2018-01-10 ENCOUNTER — Ambulatory Visit
Admit: 2018-01-10 | Discharge: 2018-01-10 | Disposition: A | Discharge status: Home / Self Care | Payer: BLUE CROSS/BLUE SHIELD | Source: Ambulatory Visit | Attending: Radiation Oncology | Admitting: Radiation Oncology

## 2018-01-10 DIAGNOSIS — Z510 Encounter for antineoplastic radiation therapy: Secondary | ICD-10-CM | POA: Insufficient documentation

## 2018-01-10 DIAGNOSIS — C770 Secondary and unspecified malignant neoplasm of lymph nodes of head, face and neck: Secondary | ICD-10-CM | POA: Insufficient documentation

## 2018-01-10 DIAGNOSIS — C3412 Malignant neoplasm of upper lobe, left bronchus or lung: Secondary | ICD-10-CM | POA: Insufficient documentation

## 2018-01-10 LAB — COMPREHENSIVE METABOLIC PANEL
ALT: 91 U/L — ABNORMAL HIGH (ref 0–44)
ANION GAP: 11 (ref 5–15)
AST: 44 U/L — ABNORMAL HIGH (ref 15–41)
Albumin: 2.6 g/dL — ABNORMAL LOW (ref 3.5–5.0)
Alkaline Phosphatase: 144 U/L — ABNORMAL HIGH (ref 38–126)
BILIRUBIN TOTAL: 0.3 mg/dL (ref 0.3–1.2)
BUN: 18 mg/dL (ref 6–20)
CHLORIDE: 102 mmol/L (ref 98–111)
CO2: 30 mmol/L (ref 22–32)
Calcium: 8.4 mg/dL — ABNORMAL LOW (ref 8.9–10.3)
Creatinine, Ser: 0.68 mg/dL (ref 0.61–1.24)
GFR calc Af Amer: >60 mL/min (ref >60)
GFR calc non Af Amer: >60 mL/min (ref >60)
GLUCOSE: 93 mg/dL (ref 70–99)
POTASSIUM: 4.4 mmol/L (ref 3.5–5.1)
SODIUM: 143 mmol/L (ref 135–145)
TOTAL PROTEIN: 6.5 g/dL (ref 6.5–8.1)

## 2018-01-10 LAB — CBC WITH DIFFERENTIAL/PLATELET
BASOS ABS: 0 10*3/uL (ref 0.0–0.1)
Basophils Relative: 0 %
EOS ABS: 0 10*3/uL (ref 0.0–0.7)
EOS PCT: 0 %
HCT: 28 % — ABNORMAL LOW (ref 39.0–52.0)
Hemoglobin: 8.5 g/dL — ABNORMAL LOW (ref 13.0–17.0)
Lymphocytes Relative: 4 %
Lymphs Abs: 0.4 10*3/uL — ABNORMAL LOW (ref 0.7–4.0)
MCH: 28.3 pg (ref 26.0–34.0)
MCHC: 30.4 g/dL (ref 30.0–36.0)
MCV: 93.3 fL (ref 78.0–100.0)
MONO ABS: 0.7 10*3/uL (ref 0.1–1.0)
MONOS PCT: 6 %
NEUTROS ABS: 10.7 10*3/uL — AB (ref 1.7–7.7)
Neutrophils Relative %: 90 %
PLATELETS: 312 10*3/uL (ref 150–400)
RBC: 3 MIL/uL — ABNORMAL LOW (ref 4.22–5.81)
RDW: 16.4 % — AB (ref 11.5–15.5)
WBC: 11.8 10*3/uL — ABNORMAL HIGH (ref 4.0–10.5)

## 2018-01-10 LAB — MAGNESIUM: Magnesium: 2 mg/dL (ref 1.7–2.4)

## 2018-01-10 MED ORDER — CEPHALEXIN 500 MG PO CAPS
500.0000 mg | ORAL_CAPSULE | Freq: Four times a day (QID) | ORAL | Status: AC
  Administered 2018-01-10 – 2018-01-14 (×17): 500 mg via ORAL
  Filled 2018-01-10 (×17): qty 1

## 2018-01-10 MED ORDER — CEPHALEXIN 500 MG PO CAPS
500.0000 mg | ORAL_CAPSULE | Freq: Three times a day (TID) | ORAL | Status: DC
  Administered 2018-01-10: 500 mg via ORAL
  Filled 2018-01-10: qty 1

## 2018-01-10 MED ORDER — METHADONE HCL 5 MG PO TABS
15.0000 mg | ORAL_TABLET | Freq: Three times a day (TID) | ORAL | Status: DC
  Administered 2018-01-10 – 2018-01-15 (×15): 15 mg via ORAL
  Filled 2018-01-10 (×15): qty 1

## 2018-01-10 NOTE — Progress Notes (Signed)
PROGRESS NOTE    DAYTON KENLEY  WPY:[id number] DOB: [birth year] DOA: 12/17/2017 PCP: [name], MD   Brief Narrative: JAXYN ROUT is a 52 y.o. male with a history of PAD s/p fem-fem bypass, thrombectomy, post-op wound infection. Patient with new diagnosis of lung mass concerning for poorly differentiated adenocarcinoma of the lung. Patient is undergoing x-ray therapy and medical oncology planning on starting chemotherapy. Hospitalization complicated by poorly controlled pain.   Assessment & Plan:   Principal Problem:   Malignant neoplasm of upper lobe of right lung (HCC) Active Problems:   S/P aortobifemoral bypass surgery   Lung mass   Postoperative wound infection   Cervical lymphadenopathy   DVT (deep venous thrombosis) (HCC)   PAD (peripheral artery disease) (HCC)   Gangrene of right foot (HCC)   AKI (acute kidney injury) ([place])   Uncontrolled pain   Palliative care encounter   Neck pain due to malignant neoplastic disease ([place])   Palliative care by specialist   Mass in neck   Adenocarcinoma [facility])   Counseling regarding advance care planning and goals of care   Poorly differentiated adenocarcinoma, lung primary. Metastatic. Cervical, mediastinal and axillary lymphadenopathy. SVC syndrome.  Upper GI series was negative for evidence of malignancy -Oncology/radiation oncology recommendations: planning chemotherapy at some point; preferably once outpatient On anti-coagulation prior to admission, currently on Arixtra, awaiting answer from oncology regarding switching to Xarelto. Radiation oncology consulted for SVC syndrome and patient is currently receiving radiation therapy, today's day 8. Pain control is difficult.  Discussed with oncology to see whether vascular surgery would have any been put and feels that no indication for endovascular surgery were normal right now.  DVT Upper extremity Multiple. Patient on Xarelto as an outpatient. Currently on  Arixtra -Per oncology -Will need plan for transition to oral regimen  Right foot gangrene Plan for outpatient management per vascular surgery. No plan for surgical management inpatient.  Acute pain Secondary to malignancy. Significant. Palliative care on board to help with management. Current regimen has started to help. Possibly complicated by additional diagnosis of cellulitis. Very thankful for PCM management  Anxiety Bipolar disorder -Continue Marinol, ativan, Lexapro  Left groin wound Left leg wound Discussed wound with vascular surgery on 7/26. No recommendations. -Continue dressing changes  Anemia Normocytic. Chronic disease. Stable. Hematology recommending to transfuse for hemoglobin less than 8  Cellulitis In setting of radiation therapy. WBC elevated but is now trended down slightly. Significant tenderness with associated erythema. CT chest significant for no abscess -Blood cultures pending (no growth to date) -Cefazolin switched to Keflex   DVT prophylaxis: Arixtra Code Status:   Code Status: Full Code Family Communication: sister and wife at bedside Disposition Plan: Discharge pending better pain control   Consultants:   Palliative care  Vascular surgery  Medical oncology  Procedures:   XRT  Antimicrobials:  Vancomycin (7/8>>7/10)  Zosyn (7/8>> 7/10)  Unasyn (7/11>>7/13)  Augmentin (7/13>>7/21)   Subjective: Pain control is still difficult.  No nausea no vomiting.  Objective: Vitals:   01/10/18 0505 01/10/18 0949 01/10/18 1420 01/10/18 1720  BP: (!) 145/87 (!) 164/93 (!) 149/74 (!) 141/78  Pulse: 98 (!) 122 (!) 108 (!) 103  Resp:  20 18 19   Temp:  (!) 101.5 F (38.6 C) 98.1 F (36.7 C) 98.4 F (36.9 C)  TempSrc:  Oral Oral Oral  SpO2:  96% 98% 94%  Weight:      Height:        Intake/Output Summary (Last 24 hours)  at 01/10/2018 1900 Last data filed at 01/10/2018 1700 Gross per 24 hour  Intake 120.4 ml  Output 875 ml  Net -754.6  ml   Filed Weights   12/18/17 1613 01/01/18 1100  Weight: 71.6 kg (157 lb 13.6 oz) 86.1 kg (189 lb 12.8 oz)    Examination:  General exam: Appears calm and comfortable Respiratory system: Respiratory effort normal. Gastrointestinal system: Abdomen is nondistended  Central nervous system: Alert and oriented. No focal neurological deficits. Extremities: Right arm edema. No calf tenderness Skin: No cyanosis. Erythema around right neck and upper chest   Data Reviewed: I have personally reviewed following labs and imaging studies  CBC: Recent Labs  Lab 01/06/18 0350 01/08/18 0350 01/10/18 0425  WBC 16.5* 14.1* 11.8*  NEUTROABS  --   --  10.7*  HGB 8.8* 8.4* 8.5*  HCT 28.7* 27.8* 28.0*  MCV 93.5 92.7 93.3  PLT 379 342 845   Basic Metabolic Panel: Recent Labs  Lab 01/06/18 0350 01/08/18 0350 01/10/18 0425  NA 143 143 143  K 4.3 4.7 4.4  CL 105 104 102  CO2 28 28 30   GLUCOSE 148* 126* 93  BUN 22* 16 18  CREATININE 0.68 0.66 0.68  CALCIUM 8.6* 8.5* 8.4*  MG  --   --  2.0   GFR: Estimated Creatinine Clearance: 136 mL/min (by C-G formula based on SCr of 0.68 mg/dL). Liver Function Tests: Recent Labs  Lab 01/10/18 0425  AST 44*  ALT 91*  ALKPHOS 144*  BILITOT 0.3  PROT 6.5  ALBUMIN 2.6*   No results for input(s): LIPASE, AMYLASE in the last 168 hours. No results for input(s): AMMONIA in the last 168 hours. Coagulation Profile: No results for input(s): INR, PROTIME in the last 168 hours. Cardiac Enzymes: No results for input(s): CKTOTAL, CKMB, CKMBINDEX, TROPONINI in the last 168 hours. BNP (last 3 results) No results for input(s): PROBNP in the last 8760 hours. HbA1C: No results for input(s): HGBA1C in the last 72 hours. CBG: No results for input(s): GLUCAP in the last 168 hours. Lipid Profile: No results for input(s): CHOL, HDL, LDLCALC, TRIG, CHOLHDL, LDLDIRECT in the last 72 hours. Thyroid Function Tests: No results for input(s): TSH, T4TOTAL,  FREET4, T3FREE, THYROIDAB in the last 72 hours. Anemia Panel: No results for input(s): VITAMINB12, FOLATE, FERRITIN, TIBC, IRON, RETICCTPCT in the last 72 hours. Sepsis Labs: No results for input(s): PROCALCITON, LATICACIDVEN in the last 168 hours.  Recent Results (from the past 240 hour(s))  Culture, blood (routine x 2)     Status: None (Preliminary result)   Collection Time: 01/06/18 11:29 AM  Result Value Ref Range Status   Specimen Description   Final    BLOOD LEFT ANTECUBITAL Performed at [facility] [address] [place], [place] [address]    Special Requests   Final    BOTTLES DRAWN AEROBIC ONLY Blood Culture adequate volume Performed at [facility] [address], [place], [place] [address]    Culture   Final    NO GROWTH 4 DAYS Performed at [facility], [place] [address] [place], [place] [address]    Report Status PENDING  Incomplete  Culture, blood (routine x 2)     Status: None (Preliminary result)   Collection Time: 01/06/18 11:30 AM  Result Value Ref Range Status   Specimen Description   Final    BLOOD LEFT WRIST Performed at [facility] [address], [place], [place] [address]  Special Requests   Final    BOTTLES DRAWN AEROBIC ONLY Blood Culture adequate volume Performed at [facility] [address], [place], [place] [address]    Culture   Final    NO GROWTH 4 DAYS Performed at [facility], [place] [address], [place], [place] [address]    Report Status PENDING  Incomplete         Radiology Studies: No results found.      Scheduled Meds: . acetaminophen  1,000 mg Oral TID  . atorvastatin  40 mg Oral Daily  . cephALEXin  500 mg Oral Q6H  . dexamethasone  4 mg Intravenous BID  . dronabinol  2.5 mg Oral BID AC  . DULoxetine  60 mg Oral Daily  . feeding supplement  1 Container Oral TID BM  . fondaparinux (ARIXTRA) injection  7.5 mg Subcutaneous q1800   . gabapentin  400 mg Oral TID  . HYDROmorphone  16 mg Oral TID  . irbesartan  150 mg Oral Daily  . lidocaine  1 patch Transdermal Q24H  . LORazepam  1 mg Oral BID  . LORazepam  1.5 mg Oral QHS  . methadone  15 mg Oral Q8H  . methocarbamol  1,000 mg Oral Q6H  . metoprolol succinate  100 mg Oral QHS  . mirtazapine  15 mg Oral QHS  . nutrition supplement (JUVEN)  1 packet Oral BID BM  . pantoprazole  40 mg Oral Daily  . polyethylene glycol  17 g Oral Daily  . senna-docusate  2 tablet Oral BID  . sodium chloride flush  3 mL Intravenous Q12H   Continuous Infusions: . sodium chloride Stopped (01/10/18 0318)  . sodium chloride       LOS: 24 days     Berle Mull, MD [facility] 01/10/2018, 7:00 PM  If 7PM-7AM, please contact night-coverage [email or web address] 01/10/2018, 7:00 PM

## 2018-01-10 NOTE — Progress Notes (Signed)
Daily Progress Note   Patient Name: Tony [name]       Date: 01/10/2018 DOB: [birth year]  Age: 52 y.o. MRN#: [id number] Attending Physician: Lavina Hamman, MD Primary Care Physician: [name], MD Admit Date: 12/17/2017  Reason for Consultation/Follow-up: Pain control, psychosocial support  Subjective: Tony [name] is sitting on side of bed. In better spirits today.    Length of Stay: 24  Current Medications: Scheduled Meds:  . acetaminophen  1,000 mg Oral TID  . atorvastatin  40 mg Oral Daily  . cephALEXin  500 mg Oral TID  . dexamethasone  4 mg Intravenous BID  . dronabinol  2.5 mg Oral BID AC  . DULoxetine  60 mg Oral Daily  . feeding supplement  1 Container Oral TID BM  . fondaparinux (ARIXTRA) injection  7.5 mg Subcutaneous q1800  . gabapentin  400 mg Oral TID  . HYDROmorphone  16 mg Oral TID  . irbesartan  150 mg Oral Daily  . lidocaine  1 patch Transdermal Q24H  . LORazepam  1 mg Oral BID  . LORazepam  1.5 mg Oral QHS  . methadone  15 mg Oral Q8H  . methocarbamol  1,000 mg Oral Q6H  . metoprolol succinate  100 mg Oral QHS  . mirtazapine  15 mg Oral QHS  . nutrition supplement (JUVEN)  1 packet Oral BID BM  . pantoprazole  40 mg Oral Daily  . polyethylene glycol  17 g Oral Daily  . senna-docusate  2 tablet Oral BID  . sodium chloride flush  3 mL Intravenous Q12H    Continuous Infusions: . sodium chloride Stopped (01/10/18 0318)  . sodium chloride      PRN Meds: sodium chloride, sodium chloride, alum & mag hydroxide-simeth, bisacodyl, guaiFENesin-dextromethorphan, hydrALAZINE, HYDROmorphone (DILAUDID) injection, HYDROmorphone, labetalol, lactulose, LORazepam, naLOXone (NARCAN)  injection, ondansetron, phenol, povidone-iodine, promethazine, sodium chloride  flush  Physical Exam  Constitutional: He is oriented to person, place, and time. He appears well-developed.  HENT:  Head: Atraumatic.  Cardiovascular: Normal rate.  Pulmonary/Chest: Effort normal. No accessory muscle usage. No tachypnea. No respiratory distress.  Abdominal: Normal appearance.  Neurological: He is alert and oriented to person, place, and time.  Nursing note and vitals reviewed.           Vital Signs: BP (!) 145/87  Pulse 98   Temp 98.7 F (37.1 C) (Oral)   Resp 18   Ht 6\' 5"  (1.956 m)   Wt 86.1 kg (189 lb 12.8 oz)   SpO2 98%   BMI 22.51 kg/m  SpO2: SpO2: 98 % O2 Device: O2 Device: Room Air O2 Flow Rate: O2 Flow Rate (L/min): 2 L/min  Intake/output summary:   Intake/Output Summary (Last 24 hours) at 01/10/2018 0935 Last data filed at 01/10/2018 0400 Gross per 24 hour  Intake 639.29 ml  Output 401 ml  Net 238.29 ml   LBM: Last BM Date: 01/07/18 Baseline Weight: Weight: 71.6 kg (157 lb 13.6 oz) Most recent weight: Weight: 86.1 kg (189 lb 12.8 oz)       Palliative Assessment/Data: 50%   Flowsheet Rows     Most Recent Value  Intake Tab  Referral Department  Surgery  Palliative Care Primary Diagnosis  Cancer  Date Notified  12/25/17  Palliative Care Type  New Palliative care  Reason for referral  Clarify Goals of Care, Non-pain Symptom  Date of Admission  12/17/17  Date first seen by Palliative Care  12/26/17  # of days Palliative referral response time  1 Day(s)  # of days IP prior to Palliative referral  8  Clinical Assessment  Psychosocial & Spiritual Assessment  Palliative Care Outcomes      Patient Active Problem List   Diagnosis Date Noted  . Counseling regarding advance care planning and goals of care 01/01/2018  . Adenocarcinoma ([place])   . SVC syndrome 12/31/2017  . Adenocarcinoma of left lung, stage 4 ([place]) 12/31/2017  . Mass in neck   . Neck pain due to malignant neoplastic disease ([place])   . Palliative care by specialist   .  Uncontrolled pain   . Palliative care encounter   . Malignant neoplasm of upper lobe of right lung ([place]) 12/25/2017  . Gangrene of right foot ([place]) 12/20/2017  . AKI (acute kidney injury) ([place]) 12/20/2017  . PAD (peripheral artery disease) ([place]) 12/19/2017  . S/P aortobifemoral bypass surgery 12/17/2017  . Lung mass 12/17/2017  . Postoperative wound infection 12/17/2017  . Weight loss 12/17/2017  . Cervical lymphadenopathy 12/17/2017  . DVT (deep venous thrombosis) ([place]) 12/17/2017  . Aortoiliac occlusive disease ([place]) 11/15/2017  . Pain of right lower extremity due to ischemia 11/13/2017  . Hyperlipidemia LDL goal <70 11/13/2017  . PVD (peripheral vascular disease) ([place]) 11/11/2017  . H. pylori infection 10/11/2011  . Duodenal ulcer 08/23/2011  . IBS (irritable bowel syndrome) 08/18/2011  . Depression with anxiety 05/11/2009  . EPIGASTRIC PAIN, CHRONIC 05/11/2009  . SMOKER 09/01/2008  . INSOMNIA, CHRONIC 09/01/2008  . Essential hypertension 09/01/2008  . PALPITATIONS, CHRONIC 09/01/2008  . GASTRITIS 10/24/2007    Palliative Care Assessment & Plan   HPI: 52 y.o. male  with past medical history of HTN, HLD, arterial occlusive disease, PVD, IBS,  chronic back pain, anxiety, depression admitted on 12/17/2017 with increased drainage from incision sites and with critical limb ischemia of left lower extremity. S/P  Right Aortobifemoral bypass and fem-pop bypass 6/6 and found to thrombectomy with fem-pop bypass on left 6/22. Hospitalization complicated by uncontrolled pain likely s/t newly diagnosed metastatic adenocarcinoma with significant lymphadenopathy to neck. Also with venous thrombus of right subclavian, brachiocephalic, and IJ veins.   Assessment: Tony [name] is in good spirits and tells me that this morning was very difficult with pain (mornings are always bad for him) but the rest of the day has been better. Unfortunately  he has also had a high fever today. He is worried about that. We  further discussed plan to begin methadone this evening and he continues to agree with this plan. We are all hopeful this will help with his pain. I did clarify that this may take some days to begin to help him at all and to be patient with [place] to monitor him and titrate as needed. He is happy with this plan.   However, if this is not adequate for his pain he is nearing the point of just wanting to go home. I told him that this is always his choice and that his pain can be continued to be worked on outpatient as well but we are both hoping this will improve at least a little as the only relief he has had is from IV dilaudid so far.   Therapeutic listening and emotional support provided.   Recommendations/Plan:  Poor intake: Boost/Breeze TID. Remeron + marinol (would consider d/c marinol). Improved.   Bowel Regimen: Senokot qhs. Having daily BM.   Anxiety: Ativan 1 mg TID + q8h prn. Nighttime dose Ativan po to 1.5 mg.   Right neck pain (r/t cancer/clots):  ? Ice pack 15 min every 4 hours.  ? Tylenol 1000 mg po TID.  ? Dilaudid po 16 mg every 2 hours prn severe pain. Increased scheduled nighttime po dilaudid 16 mg given at 2200, 0200, 0600.  ? Dilaudid IV  2-3 mg every 1 hour prn breakthrough pain.  ? Gabapentin 400 TID.  ? Lidoderm patch to right neck.  ? Fentanyl patch titrated up to 250 mcg/hr 01/07/18. Fentanyl patch D/C today. ? Cymbalta 60 mg daily. D/C Lexapro.  ? Decadron 4 mg IV TID.  ? Continue Robaxin 1000 mg QID - spread out to q6h to receive dosing at night.  ? Unfortunately Dr. Maryjean Ka has no interventional options that I can provide at this time with severity of vasculature. Recommends continued radiation therapy and anticoagulation.  ? Will start methadone 15 mg TID beginning 01/10/18 2200. Calculated from required total Fentanyl patch 250 mcg/hr + IV dilaudid 128 mg + po dilaudid 17 mg over past 24 hrs. Pain still far from controlled with current doses. MME ~1100 mg.    Code  Status:  Full code  Prognosis:   Overall poor prognosis with stage IV cancer newly diagnosed and gangrene to right foot. Attempt to better manage pain/symptoms to see if functional status may improve.   Discharge Planning:  To Be Determined  Care plan was discussed with Dr. Posey Pronto, Dr. Domingo Cocking, [facility], pharmacist.  Thank you for allowing the Palliative Medicine Team to assist in the care of this patient.   Total Time 45 min Prolonged Time Billed  no      Greater than 50%  of this time was spent counseling and coordinating care related to the above assessment and plan.  Vinie Sill, NP Palliative Medicine Team Pager # [phone] (M-F 8a-5p) Team Phone # [phone] (Nights/Weekends)

## 2018-01-11 ENCOUNTER — Ambulatory Visit
Admit: 2018-01-11 | Discharge: 2018-01-11 | Disposition: A | Discharge status: Home / Self Care | Payer: BLUE CROSS/BLUE SHIELD | Attending: Radiation Oncology | Admitting: Radiation Oncology

## 2018-01-11 LAB — CULTURE, BLOOD (ROUTINE X 2)
Culture: NO GROWTH
Culture: NO GROWTH
Special Requests: ADEQUATE
Special Requests: ADEQUATE

## 2018-01-11 MED ORDER — OLANZAPINE 5 MG PO TABS
5.0000 mg | ORAL_TABLET | Freq: Every day | ORAL | Status: DC
  Administered 2018-01-11 – 2018-01-22 (×12): 5 mg via ORAL
  Filled 2018-01-11 (×12): qty 1

## 2018-01-11 MED ORDER — MAGNESIUM HYDROXIDE 400 MG/5ML PO SUSP
15.0000 mL | Freq: Every day | ORAL | Status: DC
  Administered 2018-01-11 – 2018-01-23 (×9): 15 mL via ORAL
  Filled 2018-01-11 (×12): qty 30

## 2018-01-11 MED ORDER — HYDROCORTISONE 2.5 % RE CREA
TOPICAL_CREAM | Freq: Two times a day (BID) | RECTAL | Status: DC
  Administered 2018-01-11 – 2018-01-19 (×7): via RECTAL
  Filled 2018-01-11: qty 28.35

## 2018-01-11 MED ORDER — HYDROMORPHONE HCL 1 MG/ML IJ SOLN
2.0000 mg | INTRAMUSCULAR | Status: DC | PRN
  Administered 2018-01-11 – 2018-01-15 (×24): 2 mg via INTRAVENOUS
  Filled 2018-01-11 (×27): qty 2

## 2018-01-11 NOTE — Progress Notes (Signed)
[facility] Therapy Treatment Record Phone [phone]   Radiation Therapy was administered to Tony [name] on: 01/11/2018  12:29 PM and was treatment # 10 out of a planned course of 32 treatments.  Radiation Treatment  1). Beam Photons None  2). Brachytherapy None  3). Stereotactic Radiosurgery None  4). Other Radiation None     Trudee Kuster, RT (T)

## 2018-01-11 NOTE — Progress Notes (Signed)
Daily Progress Note   Patient Name: Tony [name]       Date: 01/11/2018 DOB: 08/21/1965  Age: 52 y.o. MRN#: [id number] Attending Physician: Lavina Hamman, MD Primary Care Physician: [name], MD Admit Date: 12/17/2017  Reason for Consultation/Follow-up: Pain control, psychosocial support  Subjective: Tony [name] is sitting on side of bed.  When asked about pain, reports having "a better morning."     Discussed plan for continued breakthrough medications as needed while waiting for methadone to reach steady state.  Length of Stay: 25  Current Medications: Scheduled Meds:  . acetaminophen  1,000 mg Oral TID  . atorvastatin  40 mg Oral Daily  . cephALEXin  500 mg Oral Q6H  . dexamethasone  4 mg Intravenous BID  . dronabinol  2.5 mg Oral BID AC  . DULoxetine  60 mg Oral Daily  . feeding supplement  1 Container Oral TID BM  . fondaparinux (ARIXTRA) injection  7.5 mg Subcutaneous q1800  . gabapentin  400 mg Oral TID  . HYDROmorphone  16 mg Oral TID  . irbesartan  150 mg Oral Daily  . lidocaine  1 patch Transdermal Q24H  . LORazepam  1 mg Oral BID  . LORazepam  1.5 mg Oral QHS  . methadone  15 mg Oral Q8H  . methocarbamol  1,000 mg Oral Q6H  . metoprolol succinate  100 mg Oral QHS  . mirtazapine  15 mg Oral QHS  . nutrition supplement (JUVEN)  1 packet Oral BID BM  . pantoprazole  40 mg Oral Daily  . polyethylene glycol  17 g Oral Daily  . senna-docusate  2 tablet Oral BID  . sodium chloride flush  3 mL Intravenous Q12H    Continuous Infusions: . sodium chloride Stopped (01/10/18 0318)  . sodium chloride      PRN Meds: sodium chloride, sodium chloride, alum & mag hydroxide-simeth, bisacodyl, guaiFENesin-dextromethorphan, hydrALAZINE, HYDROmorphone (DILAUDID) injection,  HYDROmorphone, labetalol, lactulose, LORazepam, naLOXone (NARCAN)  injection, ondansetron, phenol, povidone-iodine, promethazine, sodium chloride flush  Physical Exam  Constitutional: He is oriented to person, place, and time. He appears well-developed.  HENT:  Head: Atraumatic.  Cardiovascular: Normal rate.  Pulmonary/Chest: Effort normal. No accessory muscle usage. No tachypnea. No respiratory distress.  Abdominal: Normal appearance.  Neurological: He is alert and oriented to person, place,  and time.  Nursing note and vitals reviewed.           Vital Signs: BP (!) 147/82 (BP Location: Left Arm)   Pulse (!) 118   Temp 98.5 F (36.9 C) (Oral)   Resp 18   Ht 6\' 5"  (1.956 m)   Wt 86.1 kg (189 lb 12.8 oz)   SpO2 99%   BMI 22.51 kg/m  SpO2: SpO2: 99 % O2 Device: O2 Device: Room Air O2 Flow Rate: O2 Flow Rate (L/min): 2 L/min  Intake/output summary:   Intake/Output Summary (Last 24 hours) at 01/11/2018 1030 Last data filed at 01/11/2018 1008 Gross per 24 hour  Intake -  Output 1075 ml  Net -1075 ml   LBM: Last BM Date: 01/07/18 Baseline Weight: Weight: 71.6 kg (157 lb 13.6 oz) Most recent weight: Weight: 86.1 kg (189 lb 12.8 oz)       Palliative Assessment/Data: 50%   Flowsheet Rows     Most Recent Value  Intake Tab  Referral Department  Surgery  Palliative Care Primary Diagnosis  Cancer  Date Notified  12/25/17  Palliative Care Type  New Palliative care  Reason for referral  Clarify Goals of Care, Non-pain Symptom  Date of Admission  12/17/17  Date first seen by Palliative Care  12/26/17  # of days Palliative referral response time  1 Day(s)  # of days IP prior to Palliative referral  8  Clinical Assessment  Psychosocial & Spiritual Assessment  Palliative Care Outcomes      Patient Active Problem List   Diagnosis Date Noted  . Counseling regarding advance care planning and goals of care 01/01/2018  . Adenocarcinoma ([place])   . SVC syndrome 12/31/2017  .  Adenocarcinoma of left lung, stage 4 ([place]) 12/31/2017  . Mass in neck   . Neck pain due to malignant neoplastic disease ([place])   . Palliative care by specialist   . Uncontrolled pain   . Palliative care encounter   . Malignant neoplasm of upper lobe of right lung ([place]) 12/25/2017  . Gangrene of right foot ([place]) 12/20/2017  . AKI (acute kidney injury) ([place]) 12/20/2017  . PAD (peripheral artery disease) ([place]) 12/19/2017  . S/P aortobifemoral bypass surgery 12/17/2017  . Lung mass 12/17/2017  . Postoperative wound infection 12/17/2017  . Weight loss 12/17/2017  . Cervical lymphadenopathy 12/17/2017  . DVT (deep venous thrombosis) ([place]) 12/17/2017  . Aortoiliac occlusive disease ([place]) 11/15/2017  . Pain of right lower extremity due to ischemia 11/13/2017  . Hyperlipidemia LDL goal <70 11/13/2017  . PVD (peripheral vascular disease) ([place]) 11/11/2017  . H. pylori infection 10/11/2011  . Duodenal ulcer 08/23/2011  . IBS (irritable bowel syndrome) 08/18/2011  . Depression with anxiety 05/11/2009  . EPIGASTRIC PAIN, CHRONIC 05/11/2009  . SMOKER 09/01/2008  . INSOMNIA, CHRONIC 09/01/2008  . Essential hypertension 09/01/2008  . PALPITATIONS, CHRONIC 09/01/2008  . GASTRITIS 10/24/2007    Palliative Care Assessment & Plan   HPI: 51 y.o. male  with past medical history of HTN, HLD, arterial occlusive disease, PVD, IBS,  chronic back pain, anxiety, depression admitted on 12/17/2017 with increased drainage from incision sites and with critical limb ischemia of left lower extremity. S/P  Right Aortobifemoral bypass and fem-pop bypass 6/6 and found to thrombectomy with fem-pop bypass on left 6/22. Hospitalization complicated by uncontrolled pain likely s/t newly diagnosed metastatic adenocarcinoma with significant lymphadenopathy to neck. Also with venous thrombus of right subclavian, brachiocephalic, and IJ veins.   Recommendations/Plan:  Poor intake:  Boost/Breeze TID. Remeron + marinol.  Will  plan for transition to zyprexa and stop remeron to see if any further benefit and hope this will also provide more help with anxiety from mood stabilizer perspective.   Bowel Regimen: Senokot qhs. Having daily BM.   Anxiety: Ativan 1 mg TID + q8h prn. Nighttime dose Ativan po to 1.5 mg.  On cymbalta.  Plan for d/c remeron and trial of zyprexa.  Right neck pain (r/t cancer/clots):  ? Ice pack 15 min every 4 hours.  ? Tylenol 1000 mg po TID.  ? Dilaudid po 16 mg every 2 hours prn severe pain.  ? Dilaudid IV  2-3 mg every 1 hour prn breakthrough pain.  ? Gabapentin 400 TID.  ? Lidoderm patch to right neck.  ? Cymbalta 60 mg daily. D/C Lexapro.  ? Decadron 4 mg IV TID.  ? Continue Robaxin 1000 mg QID - spread out to q6h to receive dosing at night.  ? Unfortunately Dr. Maryjean Ka has no interventional options that I can provide at this time with severity of vasculature. Recommends continued radiation therapy and anticoagulation.  ? Started methadone 15 mg TID beginning 01/10/18 2200. Calculated from required total Fentanyl patch 250 mcg/hr + IV dilaudid 128 mg + po dilaudid 17 mg over past 24 hrs. Pain still far from controlled with current doses. MME ~1100 mg.  Fentanyl patch d/c and will d/c scheduled dilaudid.   Code Status:  Full code  Prognosis:   Overall poor prognosis with stage IV cancer newly diagnosed and gangrene to right foot. Attempt to better manage pain/symptoms to see if functional status may improve.   Discharge Planning:  To Be Determined  Care plan was discussed with Dr. Posey Pronto.  Thank you for allowing the Palliative Medicine Team to assist in the care of this patient.   Total Time 45 min Prolonged Time Billed  no      Greater than 50%  of this time was spent counseling and coordinating care related to the above assessment and plan.  Micheline Rough, MD [place] Team [phone]

## 2018-01-11 NOTE — Progress Notes (Signed)
PROGRESS NOTE    Tony [name]  HQP:[id number] DOB: 1965-09-27 DOA: 12/17/2017 PCP: [name], MD   Brief Narrative: Tony [name] is a 52 y.o. male with a history of PAD s/p fem-fem bypass, thrombectomy, post-op wound infection. Patient with new diagnosis of lung mass concerning for poorly differentiated adenocarcinoma of the lung. Patient is undergoing radiation therapy and medical oncology planning on starting chemotherapy. Hospitalization complicated by poorly controlled pain.   Assessment & Plan:   Principal Problem:   Malignant neoplasm of upper lobe of right lung ([facility]) Active Problems:   S/P aortobifemoral bypass surgery   Lung mass   Postoperative wound infection   Cervical lymphadenopathy   DVT (deep venous thrombosis) ([facility])   PAD (peripheral artery disease) ([facility])   Gangrene of right foot ([facility])   AKI (acute kidney injury) ([place])   Uncontrolled pain   Palliative care encounter   Neck pain due to malignant neoplastic disease ([place])   Palliative care by specialist   Mass in neck   Adenocarcinoma [facility])   Counseling regarding advance care planning and goals of care   Poorly differentiated adenocarcinoma, lung primary. Metastatic. Cervical, mediastinal and axillary lymphadenopathy. SVC syndrome.  Upper GI series was negative for evidence of malignancy Oncology/radiation oncology recommendations: planning chemotherapy at some point; preferably once outpatient On anti-coagulation prior to admission, currently on Arixtra, awaiting answer from oncology regarding switching to Xarelto. Radiation oncology consulted for SVC syndrome and patient is currently receiving radiation therapy. Pain control is difficult.  Discussed with oncology 01/09/2018 to see whether vascular surgery would have any been put and feels that no indication for endovascular surgery were normal right now. Discussed with radiation oncology 01/11/2018, for possible radiation dermatitis they will give  him on ointment with next treatment.  They also recommended that no indication at present to attempt other therapy for SVC syndrome. Discussed with vascular surgery Dr. Lou Cal on 01/11/2018, recommend to complete radiation therapy before considering endovascular approach for SVC syndrome.  DVT Upper extremity Multiple. Patient on Xarelto as an outpatient. Currently on Arixtra -Per oncology -Will need plan for transition to oral regimen  Right foot gangrene Plan for outpatient management per vascular surgery. No plan for surgical management inpatient.  Pain control secondary to malignancy. Difficult control Palliative care on board to help with management. Patient is on methadone 15 mg 3 times daily (fentanyl patch 250 MCG discontinued). Robaxin scheduled, Decadron scheduled, Cymbalta daily, gabapentin scheduled. Patient is also on IV Dilaudid as needed.  Ideally patient will benefit from PCA pump for pain control. Remeron discontinued to Zyprexa for anxiety Very thankful for PCM management  Anxiety Bipolar disorder On Zyprexa, Ativan and Cymbalta.  Left groin wound Left leg wound Discussed wound with vascular surgery on 7/26. No recommendations. -Continue dressing changes  Anemia Normocytic. Chronic disease. Stable. Hematology recommending to transfuse for hemoglobin less than 8  Cellulitis In setting of radiation therapy. WBC elevated but is now trended down slightly. Significant tenderness with associated erythema. CT chest significant for no abscess Patient was treated with IV cefazolin currently on Keflex. Blood cultures no growth to date. I suspect this is more likely radiation dermatitis rather than a cellulitis but patient is responding to IV antibiotics therefore I will complete the course.  Severe constipation. Bright blood per rectum. Minimal amount, monitor H&H, transfuse for hemoglobin less than 8. Aggressive bowel regimen initiated with Senokot as 2 tablets twice  daily, scheduled MiraLAX ,Anusol ointment, scheduled milk of magnesia.  DVT prophylaxis: Arixtra Code  Status:   Code Status: Full Code Family Communication: sister and wife at bedside Disposition Plan: Discharge pending better pain control   Consultants:   Palliative care  Vascular surgery  Medical oncology  Procedures:   XRT  Antimicrobials:  Vancomycin (7/8>>7/10)  Zosyn (7/8>> 7/10)  Unasyn (7/11>>7/13)  Augmentin (7/13>>7/21)   Subjective: Pain is still about the same, no nausea no vomiting.  Reportedly patient had some bright red blood per rectum today due to constipation.  Monitor.  Objective: Vitals:   01/11/18 0422 01/11/18 0945 01/11/18 1356 01/11/18 1425  BP: (!) 154/89 (!) 147/82 (!) 155/93 (!) 158/86  Pulse: (!) 103 (!) 118 (!) 113 (!) 108  Resp: 12 18 14    Temp: 99 F (37.2 C) 98.5 F (36.9 C) 98.4 F (36.9 C)   TempSrc: Oral Oral Oral   SpO2: 98% 99% 97% 98%  Weight:      Height:        Intake/Output Summary (Last 24 hours) at 01/11/2018 1633 Last data filed at 01/11/2018 1625 Gross per 24 hour  Intake 510 ml  Output 1350 ml  Net -840 ml   Filed Weights   12/18/17 1613 01/01/18 1100  Weight: 71.6 kg (157 lb 13.6 oz) 86.1 kg (189 lb 12.8 oz)    Examination:  General exam: Appears calm and comfortable Respiratory system: Respiratory effort normal. Gastrointestinal system: Abdomen is nondistended  Central nervous system: Alert and oriented. No focal neurological deficits. Extremities: Right arm edema. No calf tenderness Skin: No cyanosis. Erythema around right neck and upper chest   Data Reviewed: I have personally reviewed following labs and imaging studies  CBC: Recent Labs  Lab 01/06/18 0350 01/08/18 0350 01/10/18 0425  WBC 16.5* 14.1* 11.8*  NEUTROABS  --   --  10.7*  HGB 8.8* 8.4* 8.5*  HCT 28.7* 27.8* 28.0*  MCV 93.5 92.7 93.3  PLT 379 342 809   Basic Metabolic Panel: Recent Labs  Lab 01/06/18 0350 01/08/18 0350  01/10/18 0425  NA 143 143 143  K 4.3 4.7 4.4  CL 105 104 102  CO2 28 28 30   GLUCOSE 148* 126* 93  BUN 22* 16 18  CREATININE 0.68 0.66 0.68  CALCIUM 8.6* 8.5* 8.4*  MG  --   --  2.0   GFR: Estimated Creatinine Clearance: 136 mL/min (by C-G formula based on SCr of 0.68 mg/dL). Liver Function Tests: Recent Labs  Lab 01/10/18 0425  AST 44*  ALT 91*  ALKPHOS 144*  BILITOT 0.3  PROT 6.5  ALBUMIN 2.6*   No results for input(s): LIPASE, AMYLASE in the last 168 hours. No results for input(s): AMMONIA in the last 168 hours. Coagulation Profile: No results for input(s): INR, PROTIME in the last 168 hours. Cardiac Enzymes: No results for input(s): CKTOTAL, CKMB, CKMBINDEX, TROPONINI in the last 168 hours. BNP (last 3 results) No results for input(s): PROBNP in the last 8760 hours. HbA1C: No results for input(s): HGBA1C in the last 72 hours. CBG: No results for input(s): GLUCAP in the last 168 hours. Lipid Profile: No results for input(s): CHOL, HDL, LDLCALC, TRIG, CHOLHDL, LDLDIRECT in the last 72 hours. Thyroid Function Tests: No results for input(s): TSH, T4TOTAL, FREET4, T3FREE, THYROIDAB in the last 72 hours. Anemia Panel: No results for input(s): VITAMINB12, FOLATE, FERRITIN, TIBC, IRON, RETICCTPCT in the last 72 hours. Sepsis Labs: No results for input(s): PROCALCITON, LATICACIDVEN in the last 168 hours.  Recent Results (from the past 240 hour(s))  Culture, blood (routine x 2)  Status: None   Collection Time: 01/06/18 11:29 AM  Result Value Ref Range Status   Specimen Description   Final    BLOOD LEFT ANTECUBITAL Performed at [facility] [address] [place], [place] [address]    Special Requests   Final    BOTTLES DRAWN AEROBIC ONLY Blood Culture adequate volume Performed at [facility] [address] [place], [place] [address]    Culture   Final    NO GROWTH 5 DAYS Performed at [facility], [place]  [address], [place], [place] [address]    Report Status 01/11/2018 FINAL  Final  Culture, blood (routine x 2)     Status: None   Collection Time: 01/06/18 11:30 AM  Result Value Ref Range Status   Specimen Description   Final    BLOOD LEFT WRIST Performed at [facility] [address], [place], [place] [address]    Special Requests   Final    BOTTLES DRAWN AEROBIC ONLY Blood Culture adequate volume Performed at [facility] [address], [place], [place] [address]    Culture   Final    NO GROWTH 5 DAYS Performed at [facility], [place] [address], [place], [place] [address]    Report Status 01/11/2018 FINAL  Final         Radiology Studies: No results found.      Scheduled Meds: . acetaminophen  1,000 mg Oral TID  . atorvastatin  40 mg Oral Daily  . cephALEXin  500 mg Oral Q6H  . dexamethasone  4 mg Intravenous BID  . dronabinol  2.5 mg Oral BID AC  . DULoxetine  60 mg Oral Daily  . feeding supplement  1 Container Oral TID BM  . fondaparinux (ARIXTRA) injection  7.5 mg Subcutaneous q1800  . gabapentin  400 mg Oral TID  . irbesartan  150 mg Oral Daily  . lidocaine  1 patch Transdermal Q24H  . LORazepam  1 mg Oral BID  . LORazepam  1.5 mg Oral QHS  . methadone  15 mg Oral Q8H  . methocarbamol  1,000 mg Oral Q6H  . metoprolol succinate  100 mg Oral QHS  . nutrition supplement (JUVEN)  1 packet Oral BID BM  . OLANZapine  5 mg Oral QHS  . pantoprazole  40 mg Oral Daily  . polyethylene glycol  17 g Oral Daily  . senna-docusate  2 tablet Oral BID  . sodium chloride flush  3 mL Intravenous Q12H   Continuous Infusions: . sodium chloride Stopped (01/10/18 0318)  . sodium chloride       LOS: 25 days     Berle Mull, MD [facility] 01/11/2018, 4:33 PM  If 7PM-7AM, please contact night-coverage [email or web address] 01/11/2018, 4:33 PM

## 2018-01-11 NOTE — Progress Notes (Signed)
Accepted care of patient, agree w/ documented assessment. Patient medicated and sent for radiation therapy. Will monitor for return.

## 2018-01-11 NOTE — Progress Notes (Signed)
Physical Therapy Treatment Patient Details Name: Tony [name] MRN: [id number] DOB: 1965-09-29 Today's Date: 01/11/2018    History of Present Illness 52 year old man PMH critical limb ischemia, status post aortobifemoral bypass, right femoral artery to popliteal bypass, status post thrombectomy left limb aVF bypass.  Discharged on Xarelto.  Presented 7/8 with drainage from left groin wound and left below-knee incision.  Admitted by vascular surgery and started on IV antibiotics.  Because of neck pain, further imaging was obtained which revealed spiculated lung mass concerning for malignancy, patient transferred to hospitalist service. Pt found to have metastatic adenocarcinoma.     PT Comments    Pt ambulated in hallway with RW and performed well until the end of ambulation.  Pt reported dizziness which worsened so brought chair for pt to sit down.  Vitals obtained and RN into room and aware (vitals with no significant change from earlier values today).  Pt encouraged to continue to mobilize with staff assist for safety.   Follow Up Recommendations  Home health PT;Supervision - Intermittent     Equipment Recommendations  None recommended by PT    Recommendations for Other Services       Precautions / Restrictions Precautions Precautions: Fall Precaution Comments: R neck and UE edema, gangrenous R toes     Mobility  Bed Mobility Overal bed mobility: Modified Independent                Transfers Overall transfer level: Needs assistance Equipment used: Rolling walker (2 wheeled) Transfers: Sit to/from Stand Sit to Stand: Min guard         General transfer comment: min/guard for safety, difficulty with rise from low bed surface however able to complete without assist  Ambulation/Gait Ambulation/Gait assistance: Supervision;Min guard Gait Distance (Feet): 600 Feet Assistive device: Rolling walker (2 wheeled) Gait Pattern/deviations: Step-through pattern;Decreased  stride length     General Gait Details: able to maintain continuous gait with conversation with PT, pt with dizziness just before returning to room and required chair brought behind him; asssisted to recliner once in room and obtained vitals: RN into room and aware   Stairs             Wheelchair Mobility    Modified Rankin (Stroke Patients Only)       Balance                                            Cognition Arousal/Alertness: Awake/alert Behavior During Therapy: WFL for tasks assessed/performed Overall Cognitive Status: Within Functional Limits for tasks assessed                                        Exercises      General Comments        Pertinent Vitals/Pain Pain Assessment: 0-10 Pain Score: 4  Pain Location: swelling in R LE, R neck Pain Descriptors / Indicators: Discomfort;Sore Pain Intervention(s): Limited activity within patient's tolerance;Repositioned;Monitored during session;Premedicated before session;RN gave pain meds during session    Home Living                      Prior Function            PT Goals (current goals can now be found in the care plan  section) Acute Rehab PT Goals PT Goal Formulation: With patient Time For Goal Achievement: 01/18/18 Potential to Achieve Goals: Good Progress towards PT goals: Progressing toward goals    Frequency    Min 3X/week      PT Plan Current plan remains appropriate    Co-evaluation              AM-PAC PT "6 Clicks" Daily Activity  Outcome Measure                   End of Session   Activity Tolerance: Patient tolerated treatment well Patient left: with call bell/phone within reach;in chair;with family/visitor present;with nursing/sitter in room Nurse Communication: Mobility status PT Visit Diagnosis: Other abnormalities of gait and mobility (R26.89)     Time: 3582-5189 PT Time Calculation (min) (ACUTE ONLY): 25  min  Charges:  $Gait Training: 8-22 mins                     Carmelia Bake, PT, DPT 01/11/2018 Pager: [phone]  [name] 01/11/2018, 3:29 PM

## 2018-01-12 MED ORDER — HYDROCHLOROTHIAZIDE 25 MG PO TABS
25.0000 mg | ORAL_TABLET | Freq: Every day | ORAL | Status: DC
  Administered 2018-01-12 – 2018-01-23 (×12): 25 mg via ORAL
  Filled 2018-01-12 (×12): qty 1

## 2018-01-12 NOTE — Progress Notes (Addendum)
PROGRESS NOTE    Tony [name]  ZOX:[id number] DOB: 05/14/1966 DOA: 12/17/2017 PCP: [name], MD   Brief Narrative: Tony [name] is a 52 y.o. male with a history of PAD s/p fem-fem bypass, thrombectomy, post-op wound infection. Patient with new diagnosis of lung mass concerning for poorly differentiated adenocarcinoma of the lung. Patient is undergoing radiation therapy and medical oncology planning on starting chemotherapy. Hospitalization complicated by poorly controlled pain.   Assessment & Plan:   Principal Problem:   Malignant neoplasm of upper lobe of right lung (HCC) Active Problems:   S/P aortobifemoral bypass surgery   Lung mass   Postoperative wound infection   Cervical lymphadenopathy   DVT (deep venous thrombosis) (HCC)   PAD (peripheral artery disease) (HCC)   Gangrene of right foot (HCC)   AKI (acute kidney injury) ([place])   Uncontrolled pain   Palliative care encounter   Neck pain due to malignant neoplastic disease ([place])   Palliative care by specialist   Mass in neck   Adenocarcinoma [facility])   Counseling regarding advance care planning and goals of care   Poorly differentiated adenocarcinoma, lung primary. Metastatic. Cervical, mediastinal and axillary lymphadenopathy. SVC syndrome.  Upper GI series was negative for evidence of malignancy Oncology/radiation oncology recommendations: planning chemotherapy at some point; preferably once outpatient On anti-coagulation prior to admission, currently on Arixtra, awaiting answer from oncology regarding switching to Xarelto. Radiation oncology consulted for SVC syndrome and patient is currently receiving radiation therapy. Pain control is difficult.  But better  oncology does not see any indication for endovascular surgery right now. radiation oncology will give him on ointment with next treatment  for possible radiation dermatitis .  They also recommended that no indication at present to attempt other  therapy for SVC syndrome. vascular surgery Dr. Lou Cal on 01/11/2018, recommend to complete radiation therapy before considering endovascular approach for SVC syndrome.  DVT Upper extremity Multiple. Patient on Xarelto as an outpatient. Currently on Arixtra -Per oncology -Will need plan for transition to oral regimen  Right foot gangrene Plan for outpatient management per vascular surgery. No plan for surgical management inpatient.  Pain control secondary to malignancy. Difficult control Palliative care on board to help with management. Patient is on methadone 15 mg 3 times daily (fentanyl patch 250 MCG discontinued). Robaxin scheduled, Decadron scheduled, Cymbalta daily, gabapentin scheduled. Patient is also on IV Dilaudid as needed.  Ideally patient will benefit from PCA pump for pain control. Remeron discontinued to Zyprexa for anxiety Very thankful for PCM management  Anxiety Bipolar disorder On Zyprexa, Ativan and Cymbalta.  Left groin wound Left leg wound Discussed wound with vascular surgery on 7/26. No recommendations. -Continue dressing changes  Anemia Normocytic. Chronic disease. Stable. Hematology recommending to transfuse for hemoglobin less than 8  Cellulitis In setting of radiation therapy. WBC elevated but is now trended down slightly. Significant tenderness with associated erythema. CT chest significant for no abscess Patient was treated with IV cefazolin currently on Keflex. Blood cultures no growth to date. I suspect this is more likely radiation dermatitis rather than a cellulitis but patient is responding to IV antibiotics therefore I will complete the course. Uncontrolled hypertension tachycardia: I will add hydrochlorothiazide 25 mg which he was in at home he will continue metoprolol succinate 100 mg daily and Avapro Severe constipation.  Continue MiraLAX and Senokot Bright blood per rectum. Minimal amount, monitor H&H, transfuse for hemoglobin less than  8. Aggressive bowel regimen initiated with Senokot as 2 tablets twice daily,  scheduled MiraLAX ,Anusol ointment, scheduled milk of magnesia.  DVT prophylaxis: Arixtra Code Status:   Code Status: Full Code Family Communication: Children at bedside Disposition Plan: Discharge pending better pain control   Consultants:   Palliative care  Vascular surgery  Medical oncology  Procedures:   XRT  Antimicrobials:  Vancomycin (7/8>>7/10)  Zosyn (7/8>> 7/10)  Unasyn (7/11>>7/13)  Augmentin (7/13>>7/21)   Subjective: Pain is slightly improved, no nausea no vomiting.   He was up and ambulated today monitor.  Objective: Vitals:   01/11/18 1823 01/11/18 2054 01/12/18 0442 01/12/18 0800  BP: (!) 147/90 (!) 153/98 (!) 153/83   Pulse: 93 (!) 105 (!) 106   Resp: (!) 9 14 14 18   Temp: 98.1 F (36.7 C) 98 F (36.7 C) 98 F (36.7 C)   TempSrc: Oral Oral    SpO2: 96% 98% 97%   Weight:      Height:        Intake/Output Summary (Last 24 hours) at 01/12/2018 0842 Last data filed at 01/11/2018 1625 Gross per 24 hour  Intake 510 ml  Output 1075 ml  Net -565 ml   Filed Weights   12/18/17 1613 01/01/18 1100  Weight: 71.6 kg (157 lb 13.6 oz) 86.1 kg (189 lb 12.8 oz)    Examination:  General exam: Appears calm and comfortable Respiratory system: Respiratory effort normal. Gastrointestinal system: Abdomen is nondistended  Central nervous system: Alert and oriented. No focal neurological deficits. Extremities: Right arm edema. No calf tenderness right toes have gangrene and swollen.  He has 3+ edema in both lower extremity Skin: No cyanosis. Erythema around right neck and upper chest   Data Reviewed: I have personally reviewed following labs and imaging studies  CBC: Recent Labs  Lab 01/06/18 0350 01/08/18 0350 01/10/18 0425  WBC 16.5* 14.1* 11.8*  NEUTROABS  --   --  10.7*  HGB 8.8* 8.4* 8.5*  HCT 28.7* 27.8* 28.0*  MCV 93.5 92.7 93.3  PLT 379 342 270   Basic  Metabolic Panel: Recent Labs  Lab 01/06/18 0350 01/08/18 0350 01/10/18 0425  NA 143 143 143  K 4.3 4.7 4.4  CL 105 104 102  CO2 28 28 30   GLUCOSE 148* 126* 93  BUN 22* 16 18  CREATININE 0.68 0.66 0.68  CALCIUM 8.6* 8.5* 8.4*  MG  --   --  2.0   GFR: Estimated Creatinine Clearance: 136 mL/min (by C-G formula based on SCr of 0.68 mg/dL). Liver Function Tests: Recent Labs  Lab 01/10/18 0425  AST 44*  ALT 91*  ALKPHOS 144*  BILITOT 0.3  PROT 6.5  ALBUMIN 2.6*   No results for input(s): LIPASE, AMYLASE in the last 168 hours. No results for input(s): AMMONIA in the last 168 hours. Coagulation Profile: No results for input(s): INR, PROTIME in the last 168 hours. Cardiac Enzymes: No results for input(s): CKTOTAL, CKMB, CKMBINDEX, TROPONINI in the last 168 hours. BNP (last 3 results) No results for input(s): PROBNP in the last 8760 hours. HbA1C: No results for input(s): HGBA1C in the last 72 hours. CBG: No results for input(s): GLUCAP in the last 168 hours. Lipid Profile: No results for input(s): CHOL, HDL, LDLCALC, TRIG, CHOLHDL, LDLDIRECT in the last 72 hours. Thyroid Function Tests: No results for input(s): TSH, T4TOTAL, FREET4, T3FREE, THYROIDAB in the last 72 hours. Anemia Panel: No results for input(s): VITAMINB12, FOLATE, FERRITIN, TIBC, IRON, RETICCTPCT in the last 72 hours. Sepsis Labs: No results for input(s): PROCALCITON, LATICACIDVEN in the last 168  hours.  Recent Results (from the past 240 hour(s))  Culture, blood (routine x 2)     Status: None   Collection Time: 01/06/18 11:29 AM  Result Value Ref Range Status   Specimen Description   Final    BLOOD LEFT ANTECUBITAL Performed at [facility] [address] [place], [place] [address]    Special Requests   Final    BOTTLES DRAWN AEROBIC ONLY Blood Culture adequate volume Performed at [facility] [address], [place], [place] [address]    Culture   Final     NO GROWTH 5 DAYS Performed at [facility], [place] [address], [place], [place] [address]    Report Status 01/11/2018 FINAL  Final  Culture, blood (routine x 2)     Status: None   Collection Time: 01/06/18 11:30 AM  Result Value Ref Range Status   Specimen Description   Final    BLOOD LEFT WRIST Performed at [facility] [address], [place], [place] [address]    Special Requests   Final    BOTTLES DRAWN AEROBIC ONLY Blood Culture adequate volume Performed at [facility] [address] [place], [place] [address]    Culture   Final    NO GROWTH 5 DAYS Performed at [facility], [place] [address], [place],  [address]    Report Status 01/11/2018 FINAL  Final         Radiology Studies: No results found.      Scheduled Meds: . acetaminophen  1,000 mg Oral TID  . atorvastatin  40 mg Oral Daily  . cephALEXin  500 mg Oral Q6H  . dexamethasone  4 mg Intravenous BID  . dronabinol  2.5 mg Oral BID AC  . DULoxetine  60 mg Oral Daily  . feeding supplement  1 Container Oral TID BM  . fondaparinux (ARIXTRA) injection  7.5 mg Subcutaneous q1800  . gabapentin  400 mg Oral TID  . hydrocortisone   Rectal BID  . irbesartan  150 mg Oral Daily  . lidocaine  1 patch Transdermal Q24H  . LORazepam  1 mg Oral BID  . LORazepam  1.5 mg Oral QHS  . magnesium hydroxide  15 mL Oral Daily  . methadone  15 mg Oral Q8H  . methocarbamol  1,000 mg Oral Q6H  . metoprolol succinate  100 mg Oral QHS  . nutrition supplement (JUVEN)  1 packet Oral BID BM  . OLANZapine  5 mg Oral QHS  . pantoprazole  40 mg Oral Daily  . polyethylene glycol  17 g Oral Daily  . senna-docusate  2 tablet Oral BID  . sodium chloride flush  3 mL Intravenous Q12H   Continuous Infusions: . sodium chloride Stopped (01/10/18 0318)  . sodium chloride       LOS: 26 days     Cristal Deer, MD [facility] 01/12/2018, 8:42 AM  If 7PM-7AM, please contact  night-coverage [email or web address] 01/12/2018, 8:42 AM

## 2018-01-12 NOTE — Progress Notes (Signed)
Daily Progress Note   Patient Name: Tony [name]       Date: 01/12/2018 DOB: [birth year]  Age: 52 y.o. MRN#: [id number] Attending Physician: Cristal Deer, MD Primary Care Physician: [name], MD Admit Date: 12/17/2017  Reason for Consultation/Follow-up: Pain control, psychosocial support  Subjective: Chart reviewed.  17mg  IV diladid and 128mg  PO dilaudid in the last 24 hours.  Discussed with bedside RN.  [name] is sitting on side of bed.  When asked about pain, report "doing OK this morning, but it is starting to build."     Discussed plan for continued breakthrough medications as needed while waiting for methadone to reach steady state.  Reports good BM this morning.  Length of Stay: 26  Current Medications: Scheduled Meds:  . acetaminophen  1,000 mg Oral TID  . atorvastatin  40 mg Oral Daily  . cephALEXin  500 mg Oral Q6H  . dexamethasone  4 mg Intravenous BID  . dronabinol  2.5 mg Oral BID AC  . DULoxetine  60 mg Oral Daily  . feeding supplement  1 Container Oral TID BM  . fondaparinux (ARIXTRA) injection  7.5 mg Subcutaneous q1800  . gabapentin  400 mg Oral TID  . hydrocortisone   Rectal BID  . irbesartan  150 mg Oral Daily  . lidocaine  1 patch Transdermal Q24H  . LORazepam  1 mg Oral BID  . LORazepam  1.5 mg Oral QHS  . magnesium hydroxide  15 mL Oral Daily  . methadone  15 mg Oral Q8H  . methocarbamol  1,000 mg Oral Q6H  . metoprolol succinate  100 mg Oral QHS  . nutrition supplement (JUVEN)  1 packet Oral BID BM  . OLANZapine  5 mg Oral QHS  . pantoprazole  40 mg Oral Daily  . polyethylene glycol  17 g Oral Daily  . senna-docusate  2 tablet Oral BID  . sodium chloride flush  3 mL Intravenous Q12H    Continuous Infusions: . sodium chloride Stopped  (01/10/18 0318)  . sodium chloride      PRN Meds: sodium chloride, sodium chloride, alum & mag hydroxide-simeth, bisacodyl, guaiFENesin-dextromethorphan, hydrALAZINE, HYDROmorphone (DILAUDID) injection, HYDROmorphone, labetalol, lactulose, naLOXone (NARCAN)  injection, ondansetron, phenol, povidone-iodine, promethazine, sodium chloride flush  Physical Exam  Constitutional: He is oriented to person, place, and time.  He appears well-developed.  HENT:  Head: Atraumatic.  Cardiovascular: Normal rate.  Pulmonary/Chest: Effort normal. No accessory muscle usage. No tachypnea. No respiratory distress.  Abdominal: Normal appearance.  Neurological: He is alert and oriented to person, place, and time.  Nursing note and vitals reviewed.           Vital Signs: BP (!) 153/83 (BP Location: Left Arm)   Pulse (!) 106   Temp 98 F (36.7 C)   Resp 18   Ht 6\' 5"  (1.956 m)   Wt 86.1 kg (189 lb 12.8 oz)   SpO2 97%   BMI 22.51 kg/m  SpO2: SpO2: 97 % O2 Device: O2 Device: Room Air O2 Flow Rate: O2 Flow Rate (L/min): 2 L/min  Intake/output summary:   Intake/Output Summary (Last 24 hours) at 01/12/2018 0950 Last data filed at 01/11/2018 1625 Gross per 24 hour  Intake 510 ml  Output 1075 ml  Net -565 ml   LBM: Last BM Date: 01/12/18 Baseline Weight: Weight: 71.6 kg (157 lb 13.6 oz) Most recent weight: Weight: 86.1 kg (189 lb 12.8 oz)       Palliative Assessment/Data: 50%   Flowsheet Rows     Most Recent Value  Intake Tab  Referral Department  Surgery  Palliative Care Primary Diagnosis  Cancer  Date Notified  12/25/17  Palliative Care Type  New Palliative care  Reason for referral  Clarify Goals of Care, Non-pain Symptom  Date of Admission  12/17/17  Date first seen by Palliative Care  12/26/17  # of days Palliative referral response time  1 Day(s)  # of days IP prior to Palliative referral  8  Clinical Assessment  Psychosocial & Spiritual Assessment  Palliative Care Outcomes       Patient Active Problem List   Diagnosis Date Noted  . Counseling regarding advance care planning and goals of care 01/01/2018  . Adenocarcinoma ([place])   . SVC syndrome 12/31/2017  . Adenocarcinoma of left lung, stage 4 ([place]) 12/31/2017  . Mass in neck   . Neck pain due to malignant neoplastic disease ([place])   . Palliative care by specialist   . Uncontrolled pain   . Palliative care encounter   . Malignant neoplasm of upper lobe of right lung ([place]) 12/25/2017  . Gangrene of right foot ([place]) 12/20/2017  . AKI (acute kidney injury) ([place]) 12/20/2017  . PAD (peripheral artery disease) ([place]) 12/19/2017  . S/P aortobifemoral bypass surgery 12/17/2017  . Lung mass 12/17/2017  . Postoperative wound infection 12/17/2017  . Weight loss 12/17/2017  . Cervical lymphadenopathy 12/17/2017  . DVT (deep venous thrombosis) ([place]) 12/17/2017  . Aortoiliac occlusive disease ([place]) 11/15/2017  . Pain of right lower extremity due to ischemia 11/13/2017  . Hyperlipidemia LDL goal <70 11/13/2017  . PVD (peripheral vascular disease) ([place]) 11/11/2017  . H. pylori infection 10/11/2011  . Duodenal ulcer 08/23/2011  . IBS (irritable bowel syndrome) 08/18/2011  . Depression with anxiety 05/11/2009  . EPIGASTRIC PAIN, CHRONIC 05/11/2009  . SMOKER 09/01/2008  . INSOMNIA, CHRONIC 09/01/2008  . Essential hypertension 09/01/2008  . PALPITATIONS, CHRONIC 09/01/2008  . GASTRITIS 10/24/2007    Palliative Care Assessment & Plan   HPI: 52 y.o. male  with past medical history of HTN, HLD, arterial occlusive disease, PVD, IBS,  chronic back pain, anxiety, depression admitted on 12/17/2017 with increased drainage from incision sites and with critical limb ischemia of left lower extremity. S/P  Right Aortobifemoral bypass and fem-pop bypass 6/6 and found to thrombectomy  with fem-pop bypass on left 6/22. Hospitalization complicated by uncontrolled pain likely s/t newly diagnosed metastatic adenocarcinoma with significant  lymphadenopathy to neck. Also with venous thrombus of right subclavian, brachiocephalic, and IJ veins.   Recommendations/Plan:  Poor intake: Boost/Breeze TID. Remeron + marinol.  On 8/2, made transition to zyprexa and stop remeron to see if any further benefit and hope this will also provide more help with anxiety from mood stabilizer perspective.   Bowel Regimen: Senokot qhs. Having daily BM.   Anxiety: Ativan 1 mg TID + q8h prn. Nighttime dose Ativan po to 1.5 mg.  On cymbalta. Continue trial of zyprexa.  Right neck pain (r/t cancer/clots):  ? Ice pack 15 min every 4 hours.  ? Tylenol 1000 mg po TID.  ? Dilaudid po 16 mg every 2 hours prn severe pain.  ? Dilaudid IV  2-3 mg every 1 hour prn breakthrough pain.  ? Gabapentin 400 TID.  ? Lidoderm patch to right neck.  ? Cymbalta 60 mg daily. D/C Lexapro.  ? Decadron 4 mg IV TID.  ? Continue Robaxin 1000 mg QID - spread out to q6h to receive dosing at night.  ? Unfortunately Dr. Maryjean Ka has no interventional options that I can provide at this time with severity of vasculature. Recommends continued radiation therapy and anticoagulation.  Started methadone 15 mg TID beginning 01/10/18 2200. Still using frequent rescue medications.  Continue same while waiting for methadone to reach steady state.  Discussed again about possibility of PCA, but he has been reluctant to do so.  Code Status:  Full code  Prognosis:   Overall poor prognosis with stage IV cancer newly diagnosed and gangrene to right foot. Attempt to better manage pain/symptoms to see if functional status may improve.   Discharge Planning:  To Be Determined  Care plan was discussed with bedside RN.  Thank you for allowing the Palliative Medicine Team to assist in the care of this patient.   Total Time 40 min Prolonged Time Billed  no      Greater than 50%  of this time was spent counseling and coordinating care related to the above assessment and plan.  Micheline Rough,  MD [facility] Team [phone]

## 2018-01-13 LAB — CBC WITH DIFFERENTIAL/PLATELET
Basophils Absolute: 0 10*3/uL (ref 0.0–0.1)
Basophils Relative: 0 %
EOS ABS: 0 10*3/uL (ref 0.0–0.7)
EOS PCT: 0 %
HCT: 25.6 % — ABNORMAL LOW (ref 39.0–52.0)
Hemoglobin: 7.9 g/dL — ABNORMAL LOW (ref 13.0–17.0)
LYMPHS ABS: 0.4 10*3/uL — AB (ref 0.7–4.0)
LYMPHS PCT: 4 %
MCH: 28.9 pg (ref 26.0–34.0)
MCHC: 30.9 g/dL (ref 30.0–36.0)
MCV: 93.8 fL (ref 78.0–100.0)
MONOS PCT: 4 %
Monocytes Absolute: 0.4 10*3/uL (ref 0.1–1.0)
Neutro Abs: 9.4 10*3/uL — ABNORMAL HIGH (ref 1.7–7.7)
Neutrophils Relative %: 92 %
PLATELETS: 316 10*3/uL (ref 150–400)
RBC: 2.73 MIL/uL — ABNORMAL LOW (ref 4.22–5.81)
RDW: 16.6 % — ABNORMAL HIGH (ref 11.5–15.5)
WBC: 10.2 10*3/uL (ref 4.0–10.5)

## 2018-01-13 LAB — BASIC METABOLIC PANEL
Anion gap: 12 (ref 5–15)
BUN: 17 mg/dL (ref 6–20)
CO2: 29 mmol/L (ref 22–32)
CREATININE: 0.72 mg/dL (ref 0.61–1.24)
Calcium: 8.5 mg/dL — ABNORMAL LOW (ref 8.9–10.3)
Chloride: 99 mmol/L (ref 98–111)
GFR calc Af Amer: >60 mL/min (ref >60)
GFR calc non Af Amer: >60 mL/min (ref >60)
GLUCOSE: 105 mg/dL — AB (ref 70–99)
POTASSIUM: 4.5 mmol/L (ref 3.5–5.1)
SODIUM: 140 mmol/L (ref 135–145)

## 2018-01-13 NOTE — Progress Notes (Signed)
Daily Progress Note   Patient Name: Tony [name]       Date: 01/13/2018 DOB: Jan 24, 1966  Age: 52 y.o. MRN#: [id number] Attending Physician: Lavina Hamman, MD Primary Care Physician: [name], MD Admit Date: 12/17/2017  Reason for Consultation/Follow-up: Pain control, psychosocial support  Subjective: Chart reviewed.  10mg  IV diladid (down from 17mg  in 24 hours prior) and 112 mg PO dilaudid (down from 128mg  PO dilaudid in the 24 hours prior) in the last 24 hours.  Discussed with bedside RN.  Aikam is sitting at desk looing at computer on arrival to room.  When asked about pain, reports "still having pain, but maybe it is getting better).  Discussed plan for continued breakthrough medications as needed while waiting for methadone to reach steady state.  Wife at bedside this AM and we reviewed plan for pain management.    We also touched on the seriousness of his condition and goal of interventions being to add as much time and quality to his life as possible.  Family still not emotionally ready for deeper conversation.     Length of Stay: 27  Current Medications: Scheduled Meds:  . acetaminophen  1,000 mg Oral TID  . atorvastatin  40 mg Oral Daily  . cephALEXin  500 mg Oral Q6H  . dexamethasone  4 mg Intravenous BID  . dronabinol  2.5 mg Oral BID AC  . DULoxetine  60 mg Oral Daily  . feeding supplement  1 Container Oral TID BM  . fondaparinux (ARIXTRA) injection  7.5 mg Subcutaneous q1800  . gabapentin  400 mg Oral TID  . hydrochlorothiazide  25 mg Oral Daily  . hydrocortisone   Rectal BID  . irbesartan  150 mg Oral Daily  . lidocaine  1 patch Transdermal Q24H  . LORazepam  1 mg Oral BID  . LORazepam  1.5 mg Oral QHS  . magnesium hydroxide  15 mL Oral Daily  .  methadone  15 mg Oral Q8H  . methocarbamol  1,000 mg Oral Q6H  . metoprolol succinate  100 mg Oral QHS  . nutrition supplement (JUVEN)  1 packet Oral BID BM  . OLANZapine  5 mg Oral QHS  . pantoprazole  40 mg Oral Daily  . polyethylene glycol  17 g Oral Daily  . senna-docusate  2  tablet Oral BID  . sodium chloride flush  3 mL Intravenous Q12H    Continuous Infusions: . sodium chloride Stopped (01/10/18 0318)  . sodium chloride      PRN Meds: sodium chloride, sodium chloride, alum & mag hydroxide-simeth, bisacodyl, guaiFENesin-dextromethorphan, hydrALAZINE, HYDROmorphone (DILAUDID) injection, HYDROmorphone, labetalol, lactulose, naLOXone (NARCAN)  injection, ondansetron, phenol, povidone-iodine, promethazine, sodium chloride flush  Physical Exam  Constitutional: He is oriented to person, place, and time. He appears well-developed.  HENT:  Head: Atraumatic.  Cardiovascular: Normal rate.  Pulmonary/Chest: Effort normal. No accessory muscle usage. No tachypnea. No respiratory distress.  Abdominal: Normal appearance.  Neurological: He is alert and oriented to person, place, and time.  Nursing note and vitals reviewed.           Vital Signs: BP (!) 170/86   Pulse 97   Temp 98.4 F (36.9 C) (Oral)   Resp 20   Ht 6\' 5"  (1.956 m)   Wt 86.1 kg (189 lb 12.8 oz)   SpO2 98%   BMI 22.51 kg/m  SpO2: SpO2: 98 % O2 Device: O2 Device: Room Air O2 Flow Rate: O2 Flow Rate (L/min): 2 L/min  Intake/output summary:   Intake/Output Summary (Last 24 hours) at 01/13/2018 1140 Last data filed at 01/13/2018 0405 Gross per 24 hour  Intake -  Output 1000 ml  Net -1000 ml   LBM: Last BM Date: 01/12/18 Baseline Weight: Weight: 71.6 kg (157 lb 13.6 oz) Most recent weight: Weight: 86.1 kg (189 lb 12.8 oz)       Palliative Assessment/Data: 50%   Flowsheet Rows     Most Recent Value  Intake Tab  Referral Department  Surgery  Palliative Care Primary Diagnosis  Cancer  Date Notified   12/25/17  Palliative Care Type  New Palliative care  Reason for referral  Clarify Goals of Care, Non-pain Symptom  Date of Admission  12/17/17  Date first seen by Palliative Care  12/26/17  # of days Palliative referral response time  1 Day(s)  # of days IP prior to Palliative referral  8  Clinical Assessment  Psychosocial & Spiritual Assessment  Palliative Care Outcomes      Patient Active Problem List   Diagnosis Date Noted  . Counseling regarding advance care planning and goals of care 01/01/2018  . Adenocarcinoma ([place])   . SVC syndrome 12/31/2017  . Adenocarcinoma of left lung, stage 4 ([place]) 12/31/2017  . Mass in neck   . Neck pain due to malignant neoplastic disease ([place])   . Palliative care by specialist   . Uncontrolled pain   . Palliative care encounter   . Malignant neoplasm of upper lobe of right lung ([place]) 12/25/2017  . Gangrene of right foot ([place]) 12/20/2017  . AKI (acute kidney injury) ([place]) 12/20/2017  . PAD (peripheral artery disease) ([place]) 12/19/2017  . S/P aortobifemoral bypass surgery 12/17/2017  . Lung mass 12/17/2017  . Postoperative wound infection 12/17/2017  . Weight loss 12/17/2017  . Cervical lymphadenopathy 12/17/2017  . DVT (deep venous thrombosis) ([place]) 12/17/2017  . Aortoiliac occlusive disease ([place]) 11/15/2017  . Pain of right lower extremity due to ischemia 11/13/2017  . Hyperlipidemia LDL goal <70 11/13/2017  . PVD (peripheral vascular disease) ([place]) 11/11/2017  . H. pylori infection 10/11/2011  . Duodenal ulcer 08/23/2011  . IBS (irritable bowel syndrome) 08/18/2011  . Depression with anxiety 05/11/2009  . EPIGASTRIC PAIN, CHRONIC 05/11/2009  . SMOKER 09/01/2008  . INSOMNIA, CHRONIC 09/01/2008  . Essential hypertension 09/01/2008  .  PALPITATIONS, CHRONIC 09/01/2008  . GASTRITIS 10/24/2007    Palliative Care Assessment & Plan   HPI: 52 y.o. male  with past medical history of HTN, HLD, arterial occlusive disease, PVD, IBS,  chronic  back pain, anxiety, depression admitted on 12/17/2017 with increased drainage from incision sites and with critical limb ischemia of left lower extremity. S/P  Right Aortobifemoral bypass and fem-pop bypass 6/6 and found to thrombectomy with fem-pop bypass on left 6/22. Hospitalization complicated by uncontrolled pain likely s/t newly diagnosed metastatic adenocarcinoma with significant lymphadenopathy to neck. Also with venous thrombus of right subclavian, brachiocephalic, and IJ veins.   Recommendations/Plan:  Poor intake: Boost/Breeze TID. Remeron + marinol.  On 8/2, made transition to zyprexa and stop remeron to see if any further benefit and hope this will also provide more help with anxiety from mood stabilizer perspective.   Bowel Regimen: Senokot qhs. Having daily BM.   Anxiety: Ativan 1 mg TID + q8h prn. Nighttime dose Ativan po to 1.5 mg.  On cymbalta. Continue trial of zyprexa.  Right neck pain (r/t cancer/clots):  ? Ice pack 15 min every 4 hours.  ? Tylenol 1000 mg po TID.  ? Dilaudid po 16 mg every 2 hours prn severe pain.  ? Dilaudid IV  2-3 mg every 1 hour prn breakthrough pain.  ? Gabapentin 400 TID.  ? Lidoderm patch to right neck.  ? Cymbalta 60 mg daily. D/C Lexapro.  ? Decadron 4 mg IV TID.  ? Continue Robaxin 1000 mg QID - spread out to q6h to receive dosing at night.  ? Unfortunately Dr. Maryjean Ka has no interventional options that I can provide at this time with severity of vasculature. Recommends continued radiation therapy and anticoagulation.  Started methadone 15 mg TID beginning 01/10/18 2200. Still using frequent rescue medications but needs down slightly in last 24 hours.  Continue same while waiting for methadone to reach steady state.  Discussed again about possibility of PCA, but he has been reluctant to do so.  Code Status:  Full code  Prognosis:   Overall poor prognosis with stage IV cancer newly diagnosed and gangrene to right foot. Attempt to better manage  pain/symptoms to see if functional status may improve.   Discharge Planning:  To Be Determined  Care plan was discussed with bedside RN.  Thank you for allowing the Palliative Medicine Team to assist in the care of this patient.   Total Time 45 min Prolonged Time Billed  no      Greater than 50%  of this time was spent counseling and coordinating care related to the above assessment and plan.  Micheline Rough, MD [place] Team [phone]

## 2018-01-13 NOTE — Progress Notes (Signed)
PROGRESS NOTE    Tony [name]  VVO:[id number] DOB: [birth year] DOA: 12/17/2017 PCP: [name], MD   Brief Narrative: Tony [name] is a 52 y.o. male with a history of PAD s/p fem-fem bypass, thrombectomy, post-op wound infection. Patient with new diagnosis of lung mass concerning for poorly differentiated adenocarcinoma of the lung. Patient is undergoing radiation therapy and medical oncology planning on starting chemotherapy. Hospitalization complicated by poorly controlled pain.   Assessment & Plan:   Principal Problem:   Malignant neoplasm of upper lobe of right lung ([facility]) Active Problems:   S/P aortobifemoral bypass surgery   Lung mass   Postoperative wound infection   Cervical lymphadenopathy   DVT (deep venous thrombosis) ([facility])   PAD (peripheral artery disease) ([facility])   Gangrene of right foot ([facility])   AKI (acute kidney injury) ([place])   Uncontrolled pain   Palliative care encounter   Neck pain due to malignant neoplastic disease ([place])   Palliative care by specialist   Mass in neck   Adenocarcinoma [facility])   Counseling regarding advance care planning and goals of care   Poorly differentiated adenocarcinoma, lung primary. Metastatic. Cervical, mediastinal and axillary lymphadenopathy. SVC syndrome.  Upper GI series was negative for evidence of malignancy Oncology/radiation oncology recommendations: planning chemotherapy at some point; preferably once outpatient On anti-coagulation prior to admission, currently on Arixtra, awaiting answer from oncology regarding switching to Xarelto. Radiation oncology consulted for SVC syndrome and patient is currently receiving radiation therapy. Pain control is difficult.  Discussed with oncology 01/09/2018 to see whether vascular surgery would have any been put and feels that no indication for endovascular surgery were normal right now. Discussed with radiation oncology 01/11/2018, for possible radiation dermatitis they will give  him on ointment with next treatment.  They also recommended that no indication at present to attempt other therapy for SVC syndrome. Discussed with vascular surgery Dr. Scot Dock on 01/11/2018, recommend to complete radiation therapy before considering endovascular approach for SVC syndrome.  DVT Upper extremity Multiple. Patient on Xarelto as an outpatient. Currently on Arixtra -Per oncology -Will need plan for transition to oral regimen  Right foot gangrene Plan for outpatient management per vascular surgery. No plan for surgical management inpatient.  Pain control secondary to malignancy. Difficult control Palliative care on board to help with management. Patient is on methadone 15 mg 3 times daily (fentanyl patch 250 MCG discontinued). Robaxin scheduled, Decadron scheduled, Cymbalta daily, gabapentin scheduled. Patient is also on IV Dilaudid as needed.  Ideally patient will benefit from PCA pump for pain control. Remeron discontinued to Zyprexa for anxiety Very thankful for PCM management  Anxiety Bipolar disorder On Zyprexa, Ativan and Cymbalta.  Left groin wound Left leg wound Discussed wound with vascular surgery on 7/26. No recommendations. -Continue dressing changes  Anemia Normocytic. Chronic disease. Stable. Hematology recommending to transfuse for hemoglobin less than 8  Cellulitis In setting of radiation therapy. WBC elevated but is now trended down slightly. Significant tenderness with associated erythema. CT chest significant for no abscess Patient was treated with IV cefazolin currently on Keflex. Blood cultures no growth to date. I suspect this is more likely radiation dermatitis rather than a cellulitis but patient is responding to IV antibiotics therefore I will complete the course.  Severe constipation. Bright blood per rectum. Minimal amount, monitor H&H, transfuse for hemoglobin less than 8. Aggressive bowel regimen initiated with Senokot as 2 tablets twice  daily, scheduled MiraLAX ,Anusol ointment, scheduled milk of magnesia.  DVT prophylaxis: Arixtra Code  Status:   Code Status: Full Code Family Communication: sister and wife at bedside Disposition Plan: Discharge pending better pain control   Consultants:   Palliative care  Vascular surgery  Medical oncology  Procedures:   XRT  Antimicrobials:  Vancomycin (7/8>>7/10)  Zosyn (7/8>> 7/10)  Unasyn (7/11>>7/13)  Augmentin (7/13>>7/21)   Subjective: Pain is better no nausea no vomiting.  Constipation is also better but no fever no chills.  Objective: Vitals:   01/12/18 2100 01/13/18 0405 01/13/18 0900 01/13/18 1238  BP: (!) 161/94 (!) 160/88 (!) 170/86 (!) 146/77  Pulse: (!) 112 97  (!) 120  Resp: 20 20  12   Temp: 98.3 F (36.8 C) 98.4 F (36.9 C)  99.6 F (37.6 C)  TempSrc: Oral Oral  Oral  SpO2: 100% 98%  97%  Weight:      Height:        Intake/Output Summary (Last 24 hours) at 01/13/2018 1725 Last data filed at 01/13/2018 1241 Gross per 24 hour  Intake -  Output 1650 ml  Net -1650 ml   Filed Weights   12/18/17 1613 01/01/18 1100  Weight: 71.6 kg (157 lb 13.6 oz) 86.1 kg (189 lb 12.8 oz)    Examination:  General exam: Appears calm and comfortable Respiratory system: Respiratory effort normal. Gastrointestinal system: Abdomen is nondistended  Central nervous system: Alert and oriented. No focal neurological deficits. Extremities: Right arm edema. No calf tenderness Skin: No cyanosis. Erythema around right neck and upper chest   Data Reviewed: I have personally reviewed following labs and imaging studies  CBC: Recent Labs  Lab 01/08/18 0350 01/10/18 0425 01/13/18 0449  WBC 14.1* 11.8* 10.2  NEUTROABS  --  10.7* 9.4*  HGB 8.4* 8.5* 7.9*  HCT 27.8* 28.0* 25.6*  MCV 92.7 93.3 93.8  PLT 342 312 654   Basic Metabolic Panel: Recent Labs  Lab 01/08/18 0350 01/10/18 0425 01/13/18 0449  NA 143 143 140  K 4.7 4.4 4.5  CL 104 102 99  CO2 28  30 29   GLUCOSE 126* 93 105*  BUN 16 18 17   CREATININE 0.66 0.68 0.72  CALCIUM 8.5* 8.4* 8.5*  MG  --  2.0  --    GFR: Estimated Creatinine Clearance: 136 mL/min (by C-G formula based on SCr of 0.72 mg/dL). Liver Function Tests: Recent Labs  Lab 01/10/18 0425  AST 44*  ALT 91*  ALKPHOS 144*  BILITOT 0.3  PROT 6.5  ALBUMIN 2.6*   No results for input(s): LIPASE, AMYLASE in the last 168 hours. No results for input(s): AMMONIA in the last 168 hours. Coagulation Profile: No results for input(s): INR, PROTIME in the last 168 hours. Cardiac Enzymes: No results for input(s): CKTOTAL, CKMB, CKMBINDEX, TROPONINI in the last 168 hours. BNP (last 3 results) No results for input(s): PROBNP in the last 8760 hours. HbA1C: No results for input(s): HGBA1C in the last 72 hours. CBG: No results for input(s): GLUCAP in the last 168 hours. Lipid Profile: No results for input(s): CHOL, HDL, LDLCALC, TRIG, CHOLHDL, LDLDIRECT in the last 72 hours. Thyroid Function Tests: No results for input(s): TSH, T4TOTAL, FREET4, T3FREE, THYROIDAB in the last 72 hours. Anemia Panel: No results for input(s): VITAMINB12, FOLATE, FERRITIN, TIBC, IRON, RETICCTPCT in the last 72 hours. Sepsis Labs: No results for input(s): PROCALCITON, LATICACIDVEN in the last 168 hours.  Recent Results (from the past 240 hour(s))  Culture, blood (routine x 2)     Status: None   Collection Time: 01/06/18 11:29 AM  Result Value Ref Range Status   Specimen Description   Final    BLOOD LEFT ANTECUBITAL Performed at [facility] [address], [place], [place] [address]    Special Requests   Final    BOTTLES DRAWN AEROBIC ONLY Blood Culture adequate volume Performed at [facility] [address], [place], [place] [address]    Culture   Final    NO GROWTH 5 DAYS Performed at [facility], [place] [address], [place], [place] [address]    Report Status 01/11/2018 FINAL  Final    Culture, blood (routine x 2)     Status: None   Collection Time: 01/06/18 11:30 AM  Result Value Ref Range Status   Specimen Description   Final    BLOOD LEFT WRIST Performed at [facility] [address], [place], [place] [address]    Special Requests   Final    BOTTLES DRAWN AEROBIC ONLY Blood Culture adequate volume Performed at [facility] [address], [place], [place] [address]    Culture   Final    NO GROWTH 5 DAYS Performed at [facility], [place] [address] [place],  [address]    Report Status 01/11/2018 FINAL  Final         Radiology Studies: No results found.      Scheduled Meds: . acetaminophen  1,000 mg Oral TID  . atorvastatin  40 mg Oral Daily  . cephALEXin  500 mg Oral Q6H  . dexamethasone  4 mg Intravenous BID  . dronabinol  2.5 mg Oral BID AC  . DULoxetine  60 mg Oral Daily  . feeding supplement  1 Container Oral TID BM  . fondaparinux (ARIXTRA) injection  7.5 mg Subcutaneous q1800  . gabapentin  400 mg Oral TID  . hydrochlorothiazide  25 mg Oral Daily  . hydrocortisone   Rectal BID  . irbesartan  150 mg Oral Daily  . lidocaine  1 patch Transdermal Q24H  . LORazepam  1 mg Oral BID  . LORazepam  1.5 mg Oral QHS  . magnesium hydroxide  15 mL Oral Daily  . methadone  15 mg Oral Q8H  . methocarbamol  1,000 mg Oral Q6H  . metoprolol succinate  100 mg Oral QHS  . nutrition supplement (JUVEN)  1 packet Oral BID BM  . OLANZapine  5 mg Oral QHS  . pantoprazole  40 mg Oral Daily  . polyethylene glycol  17 g Oral Daily  . senna-docusate  2 tablet Oral BID  . sodium chloride flush  3 mL Intravenous Q12H   Continuous Infusions: . sodium chloride Stopped (01/10/18 0318)  . sodium chloride       LOS: 27 days     Berle Mull, MD [facility] 01/13/2018, 5:25 PM  If 7PM-7AM, please contact night-coverage [email or web address] 01/13/2018, 5:25 PM

## 2018-01-14 ENCOUNTER — Ambulatory Visit
Admit: 2018-01-14 | Discharge: 2018-01-14 | Disposition: A | Discharge status: Home / Self Care | Payer: BLUE CROSS/BLUE SHIELD | Attending: Radiation Oncology | Admitting: Radiation Oncology

## 2018-01-14 MED ORDER — HYDROMORPHONE HCL 8 MG PO TABS
12.0000 mg | ORAL_TABLET | ORAL | Status: DC | PRN
  Administered 2018-01-15 (×3): 12 mg via ORAL
  Filled 2018-01-14 (×4): qty 1

## 2018-01-14 NOTE — Progress Notes (Signed)
[facility] Therapy Treatment Record Phone [phone]   Radiation Therapy was administered to Tony [name] on: 01/14/2018  2:28 PM and was treatment # 11 out of a planned course of 32 treatments.  Radiation Treatment  1). Beam photons with 6-10 energy  2). Brachytherapy None  3). Stereotactic Radiosurgery None  4). Other Radiation None     Marguita Venning J Rainie Crenshaw, RT

## 2018-01-14 NOTE — Progress Notes (Signed)
Daily Progress Note   Patient Name: Tony [name]       Date: 01/14/2018 DOB: [birth year]  Age: 52 y.o. MRN#: [id number] Attending Physician: Lavina Hamman, MD Primary Care Physician: [name], MD Admit Date: 12/17/2017  Reason for Consultation/Follow-up: Pain control, psychosocial support  Subjective: Chart reviewed. MAR reveals rescue medication in last 24 hours with oral morphine equivalent of 400mg  of oral morphine (~650mg  oral morphine equivalent in 24 hours prior to this.  Discussed with bedside RN.  Samael is sitting on window seat on entering room.  Reports pain is "still there but tolerable".  Discussed plan for continued breakthrough medications as needed while waiting for methadone to reach steady state.  Niece at bedside at time of encounter.  Length of Stay: 28  Current Medications: Scheduled Meds:  . acetaminophen  1,000 mg Oral TID  . atorvastatin  40 mg Oral Daily  . cephALEXin  500 mg Oral Q6H  . dexamethasone  4 mg Intravenous BID  . dronabinol  2.5 mg Oral BID AC  . DULoxetine  60 mg Oral Daily  . feeding supplement  1 Container Oral TID BM  . fondaparinux (ARIXTRA) injection  7.5 mg Subcutaneous q1800  . gabapentin  400 mg Oral TID  . hydrochlorothiazide  25 mg Oral Daily  . hydrocortisone   Rectal BID  . irbesartan  150 mg Oral Daily  . lidocaine  1 patch Transdermal Q24H  . LORazepam  1 mg Oral BID  . LORazepam  1.5 mg Oral QHS  . magnesium hydroxide  15 mL Oral Daily  . methadone  15 mg Oral Q8H  . methocarbamol  1,000 mg Oral Q6H  . metoprolol succinate  100 mg Oral QHS  . nutrition supplement (JUVEN)  1 packet Oral BID BM  . OLANZapine  5 mg Oral QHS  . pantoprazole  40 mg Oral Daily  . polyethylene glycol  17 g Oral Daily  . senna-docusate   2 tablet Oral BID  . sodium chloride flush  3 mL Intravenous Q12H    Continuous Infusions: . sodium chloride Stopped (01/10/18 0318)  . sodium chloride      PRN Meds: sodium chloride, sodium chloride, alum & mag hydroxide-simeth, bisacodyl, guaiFENesin-dextromethorphan, hydrALAZINE, HYDROmorphone (DILAUDID) injection, HYDROmorphone, labetalol, lactulose, naLOXone (NARCAN)  injection, ondansetron, phenol, povidone-iodine, promethazine,  sodium chloride flush  Physical Exam  Constitutional: He is oriented to person, place, and time. He appears well-developed.  HENT:  Head: Atraumatic.  Cardiovascular: Normal rate.  Pulmonary/Chest: Effort normal. No accessory muscle usage. No tachypnea. No respiratory distress.  Abdominal: Normal appearance.  Neurological: He is alert and oriented to person, place, and time.  Nursing note and vitals reviewed.           Vital Signs: BP (!) 147/81 (BP Location: Right Arm)   Pulse (!) 108   Temp 98.8 F (37.1 C) (Oral)   Resp 18   Ht 6\' 5"  (1.956 m)   Wt 86.1 kg (189 lb 12.8 oz)   SpO2 96%   BMI 22.51 kg/m  SpO2: SpO2: 96 % O2 Device: O2 Device: Room Air O2 Flow Rate: O2 Flow Rate (L/min): 2 L/min  Intake/output summary:   Intake/Output Summary (Last 24 hours) at 01/14/2018 2231 Last data filed at 01/14/2018 2000 Gross per 24 hour  Intake 460 ml  Output 650 ml  Net -190 ml   LBM: Last BM Date: 01/14/18 Baseline Weight: Weight: 71.6 kg (157 lb 13.6 oz) Most recent weight: Weight: 86.1 kg (189 lb 12.8 oz)       Palliative Assessment/Data: 50%   Flowsheet Rows     Most Recent Value  Intake Tab  Referral Department  Surgery  Palliative Care Primary Diagnosis  Cancer  Date Notified  12/25/17  Palliative Care Type  New Palliative care  Reason for referral  Clarify Goals of Care, Non-pain Symptom  Date of Admission  12/17/17  Date first seen by Palliative Care  12/26/17  # of days Palliative referral response time  1 Day(s)  # of  days IP prior to Palliative referral  8  Clinical Assessment  Psychosocial & Spiritual Assessment  Palliative Care Outcomes      Patient Active Problem List   Diagnosis Date Noted  . Counseling regarding advance care planning and goals of care 01/01/2018  . Adenocarcinoma ([place])   . SVC syndrome 12/31/2017  . Adenocarcinoma of left lung, stage 4 ([place]) 12/31/2017  . Mass in neck   . Neck pain due to malignant neoplastic disease ([place])   . Palliative care by specialist   . Uncontrolled pain   . Palliative care encounter   . Malignant neoplasm of upper lobe of right lung ([place]) 12/25/2017  . Gangrene of right foot ([place]) 12/20/2017  . AKI (acute kidney injury) ([place]) 12/20/2017  . PAD (peripheral artery disease) ([place]) 12/19/2017  . S/P aortobifemoral bypass surgery 12/17/2017  . Lung mass 12/17/2017  . Postoperative wound infection 12/17/2017  . Weight loss 12/17/2017  . Cervical lymphadenopathy 12/17/2017  . DVT (deep venous thrombosis) ([place]) 12/17/2017  . Aortoiliac occlusive disease ([place]) 11/15/2017  . Pain of right lower extremity due to ischemia 11/13/2017  . Hyperlipidemia LDL goal <70 11/13/2017  . PVD (peripheral vascular disease) ([place]) 11/11/2017  . H. pylori infection 10/11/2011  . Duodenal ulcer 08/23/2011  . IBS (irritable bowel syndrome) 08/18/2011  . Depression with anxiety 05/11/2009  . EPIGASTRIC PAIN, CHRONIC 05/11/2009  . SMOKER 09/01/2008  . INSOMNIA, CHRONIC 09/01/2008  . Essential hypertension 09/01/2008  . PALPITATIONS, CHRONIC 09/01/2008  . GASTRITIS 10/24/2007    Palliative Care Assessment & Plan   HPI: 52 y.o. male  with past medical history of HTN, HLD, arterial occlusive disease, PVD, IBS,  chronic back pain, anxiety, depression admitted on 12/17/2017 with increased drainage from incision sites and with critical limb ischemia  of left lower extremity. S/P  Right Aortobifemoral bypass and fem-pop bypass 6/6 and found to thrombectomy with fem-pop bypass on  left 6/22. Hospitalization complicated by uncontrolled pain likely s/t newly diagnosed metastatic adenocarcinoma with significant lymphadenopathy to neck. Also with venous thrombus of right subclavian, brachiocephalic, and IJ veins.   Recommendations/Plan:  Poor intake: Boost/Breeze TID. Zyprexa + marinol.   Bowel Regimen: Senokot qhs. Having daily BM.   Anxiety: Ativan 1 mg TID + q8h prn. Nighttime dose Ativan po to 1.5 mg.  On cymbalta. Continue trial of zyprexa.  Right neck pain (r/t cancer/clots):  ? Ice pack 15 min every 4 hours.  ? Tylenol 1000 mg po TID.  ? Dilaudid po 16 mg every 2 hours prn severe pain.  ? Dilaudid IV  2-3 mg every 1 hour prn breakthrough pain.  ? Gabapentin 400 TID.  ? Lidoderm patch to right neck.  ? Cymbalta 60 mg daily. D/C Lexapro.  ? Decadron 4 mg IV TID.  ? Continue Robaxin 1000 mg QID - spread out to q6h to receive dosing at night.  ? Unfortunately Dr. Maryjean Ka has no interventional options that I can provide at this time with severity of vasculature. Recommends continued radiation therapy and anticoagulation.  Started methadone 15 mg TID beginning 01/10/18 2200. Still using frequent rescue medications but needs down again in last 24 hours.  Will therefore continue with same dose (day 4) and reassess tomorrow.  May need to increase methadone.  D/w Dr. Hilma Favors who also knows patient and will follow-up with him tomorrow. Code Status:  Full code  Prognosis:   Overall poor prognosis with stage IV cancer newly diagnosed and gangrene to right foot. Attempt to better manage pain/symptoms to see if functional status may improve.   Discharge Planning:  To Be Determined  Care plan was discussed with bedside RN.  Thank you for allowing the Palliative Medicine Team to assist in the care of this patient.   Total Time 45 min Prolonged Time Billed  no      Greater than 50%  of this time was spent counseling and coordinating care related to the above  assessment and plan.  Micheline Rough, MD [facility] [phone]

## 2018-01-14 NOTE — Progress Notes (Signed)
PT Cancellation Note  Patient Details Name: Tony [name] MRN: [id number] DOB: [birth year]   Cancelled Treatment:      amb this morning then out of room for Radiation this afternoon.  Will attempt to see as schedule permits.  Pt has been evaluated with rec for [facility] PT   Rica Koyanagi  PTA [facility]  Acute  Rehab Pager      [phone]

## 2018-01-14 NOTE — Progress Notes (Signed)
PROGRESS NOTE    Tony [name]  NOB:[id number] DOB: [birth year] DOA: 12/17/2017 PCP: [name], MD   Brief Narrative: Tony [name] is a 52 y.o. male with a history of PAD s/p fem-fem bypass, thrombectomy, post-op wound infection. Patient with new diagnosis of lung mass concerning for poorly differentiated adenocarcinoma of the lung. Patient is undergoing radiation therapy and medical oncology planning on starting chemotherapy. Hospitalization complicated by poorly controlled pain.   Assessment & Plan:   Principal Problem:   Malignant neoplasm of upper lobe of right lung (HCC) Active Problems:   S/P aortobifemoral bypass surgery   Lung mass   Postoperative wound infection   Cervical lymphadenopathy   DVT (deep venous thrombosis) (H[facility])   PAD (peripheral artery disease) (H[facility])   Gangrene of right foot (HCC)   AKI (acute kidney injury) ([place])   Uncontrolled pain   Palliative care encounter   Neck pain due to malignant neoplastic disease ([place])   Palliative care by specialist   Mass in neck   Adenocarcinoma [facility])   Counseling regarding advance care planning and goals of care   Poorly differentiated adenocarcinoma, lung primary. Metastatic. Cervical, mediastinal and axillary lymphadenopathy. SVC syndrome.  Upper GI series was negative for evidence of malignancy Oncology/radiation oncology recommendations: planning chemotherapy at some point; preferably once outpatient On anti-coagulation prior to admission, currently on Arixtra, awaiting answer from oncology regarding switching to Xarelto. Radiation oncology consulted for SVC syndrome and patient is currently receiving radiation therapy. Pain control is difficult.  Discussed with oncology 01/09/2018 to see whether vascular surgery would have any been put and feels that no indication for endovascular surgery were normal right now. Discussed with radiation oncology 01/11/2018, for possible radiation dermatitis they will give  him on ointment with next treatment.  They also recommended that no indication at present to attempt other therapy for SVC syndrome. Discussed with vascular surgery Dr. Scot Dock on 01/11/2018, recommend to complete radiation therapy before considering endovascular approach for SVC syndrome.  DVT Upper extremity Multiple. Patient on Xarelto as an outpatient. Currently on Arixtra -Per oncology -Will need plan for transition to oral regimen  Right foot gangrene Plan for outpatient management per vascular surgery. No plan for surgical management inpatient.  Pain control secondary to malignancy. Difficult control Palliative care on board to help with management. Patient is on methadone 15 mg 3 times daily (fentanyl patch 250 MCG discontinued). Robaxin scheduled, Decadron scheduled, Cymbalta daily, gabapentin scheduled. Patient is also on IV Dilaudid as needed.  Ideally patient will benefit from PCA pump for pain control. Remeron discontinued to Zyprexa for anxiety Very thankful for PCM management  Anxiety Bipolar disorder On Zyprexa, Ativan and Cymbalta.  Left groin wound Left leg wound Discussed wound with vascular surgery on 7/26. No recommendations. -Continue dressing changes  Anemia Normocytic. Chronic disease. Stable. Hematology recommending to transfuse for hemoglobin less than 8  Cellulitis In setting of radiation therapy. WBC elevated but is now trended down slightly. Significant tenderness with associated erythema. CT chest significant for no abscess Patient was treated with IV cefazolin currently on Keflex. Blood cultures no growth to date. I suspect this is more likely radiation dermatitis rather than a cellulitis but patient is responding to IV antibiotics therefore I will complete the course.  Severe constipation. Bright blood per rectum. Minimal amount, monitor H&H, transfuse for hemoglobin less than 8. Aggressive bowel regimen initiated with Senokot as 2 tablets twice  daily, scheduled MiraLAX ,Anusol ointment, scheduled milk of magnesia.  DVT prophylaxis: Arixtra, I  will discuss with oncology tomorrow regarding switch to p.o. Code Status:   Code Status: Full Code Family Communication: sister and wife at bedside Disposition Plan: Discharge pending better pain control   Consultants:   Palliative care  Vascular surgery  Medical oncology  Procedures:   XRT  Antimicrobials:  Vancomycin (7/8>>7/10)  Zosyn (7/8>> 7/10)  Unasyn (7/11>>7/13)  Augmentin (7/13>>7/21)   Subjective: In better spirit today.  No acute complaint.  Constipation is resolved.  No nausea  Objective: Vitals:   01/13/18 2058 01/14/18 0634 01/14/18 0900 01/14/18 1413  BP: (!) 143/73 129/76 (!) 147/79 (!) 141/83  Pulse: (!) 113 84 (!) 102 (!) 108  Resp: 19 12 20 16   Temp: 98.6 F (37 C) 98.9 F (37.2 C)  98.6 F (37 C)  TempSrc: Oral Oral  Oral  SpO2: 95% 96%  100%  Weight:      Height:        Intake/Output Summary (Last 24 hours) at 01/14/2018 1807 Last data filed at 01/14/2018 1000 Gross per 24 hour  Intake 220 ml  Output 650 ml  Net -430 ml   Filed Weights   12/18/17 1613 01/01/18 1100  Weight: 71.6 kg (157 lb 13.6 oz) 86.1 kg (189 lb 12.8 oz)    Examination:  General exam: Appears calm and comfortable Respiratory system: Respiratory effort normal. Gastrointestinal system: Abdomen is nondistended  Central nervous system: Alert and oriented. No focal neurological deficits. Extremities: Right arm edema. No calf tenderness Skin: No cyanosis. Erythema around right neck and upper chest   Data Reviewed: I have personally reviewed following labs and imaging studies  CBC: Recent Labs  Lab 01/08/18 0350 01/10/18 0425 01/13/18 0449  WBC 14.1* 11.8* 10.2  NEUTROABS  --  10.7* 9.4*  HGB 8.4* 8.5* 7.9*  HCT 27.8* 28.0* 25.6*  MCV 92.7 93.3 93.8  PLT 342 312 633   Basic Metabolic Panel: Recent Labs  Lab 01/08/18 0350 01/10/18 0425 01/13/18 0449   NA 143 143 140  K 4.7 4.4 4.5  CL 104 102 99  CO2 28 30 29   GLUCOSE 126* 93 105*  BUN 16 18 17   CREATININE 0.66 0.68 0.72  CALCIUM 8.5* 8.4* 8.5*  MG  --  2.0  --    GFR: Estimated Creatinine Clearance: 136 mL/min (by C-G formula based on SCr of 0.72 mg/dL). Liver Function Tests: Recent Labs  Lab 01/10/18 0425  AST 44*  ALT 91*  ALKPHOS 144*  BILITOT 0.3  PROT 6.5  ALBUMIN 2.6*   No results for input(s): LIPASE, AMYLASE in the last 168 hours. No results for input(s): AMMONIA in the last 168 hours. Coagulation Profile: No results for input(s): INR, PROTIME in the last 168 hours. Cardiac Enzymes: No results for input(s): CKTOTAL, CKMB, CKMBINDEX, TROPONINI in the last 168 hours. BNP (last 3 results) No results for input(s): PROBNP in the last 8760 hours. HbA1C: No results for input(s): HGBA1C in the last 72 hours. CBG: No results for input(s): GLUCAP in the last 168 hours. Lipid Profile: No results for input(s): CHOL, HDL, LDLCALC, TRIG, CHOLHDL, LDLDIRECT in the last 72 hours. Thyroid Function Tests: No results for input(s): TSH, T4TOTAL, FREET4, T3FREE, THYROIDAB in the last 72 hours. Anemia Panel: No results for input(s): VITAMINB12, FOLATE, FERRITIN, TIBC, IRON, RETICCTPCT in the last 72 hours. Sepsis Labs: No results for input(s): PROCALCITON, LATICACIDVEN in the last 168 hours.  Recent Results (from the past 240 hour(s))  Culture, blood (routine x 2)     Status:  None   Collection Time: 01/06/18 11:29 AM  Result Value Ref Range Status   Specimen Description   Final    BLOOD LEFT ANTECUBITAL Performed at [facility] [address] [place], [place] [address]    Special Requests   Final    BOTTLES DRAWN AEROBIC ONLY Blood Culture adequate volume Performed at [facility] [address], [place], [place] [address]    Culture   Final    NO GROWTH 5 DAYS Performed at [facility], [place] [address]  [place], [place] [address]    Report Status 01/11/2018 FINAL  Final  Culture, blood (routine x 2)     Status: None   Collection Time: 01/06/18 11:30 AM  Result Value Ref Range Status   Specimen Description   Final    BLOOD LEFT WRIST Performed at [facility] [address], [place], [place] [address]    Special Requests   Final    BOTTLES DRAWN AEROBIC ONLY Blood Culture adequate volume Performed at [facility] [address] [place], [place] [address]    Culture   Final    NO GROWTH 5 DAYS Performed at [facility], [place] [address] [place], [place] [address]    Report Status 01/11/2018 FINAL  Final         Radiology Studies: No results found.      Scheduled Meds: . acetaminophen  1,000 mg Oral TID  . atorvastatin  40 mg Oral Daily  . cephALEXin  500 mg Oral Q6H  . dexamethasone  4 mg Intravenous BID  . dronabinol  2.5 mg Oral BID AC  . DULoxetine  60 mg Oral Daily  . feeding supplement  1 Container Oral TID BM  . fondaparinux (ARIXTRA) injection  7.5 mg Subcutaneous q1800  . gabapentin  400 mg Oral TID  . hydrochlorothiazide  25 mg Oral Daily  . hydrocortisone   Rectal BID  . irbesartan  150 mg Oral Daily  . lidocaine  1 patch Transdermal Q24H  . LORazepam  1 mg Oral BID  . LORazepam  1.5 mg Oral QHS  . magnesium hydroxide  15 mL Oral Daily  . methadone  15 mg Oral Q8H  . methocarbamol  1,000 mg Oral Q6H  . metoprolol succinate  100 mg Oral QHS  . nutrition supplement (JUVEN)  1 packet Oral BID BM  . OLANZapine  5 mg Oral QHS  . pantoprazole  40 mg Oral Daily  . polyethylene glycol  17 g Oral Daily  . senna-docusate  2 tablet Oral BID  . sodium chloride flush  3 mL Intravenous Q12H   Continuous Infusions: . sodium chloride Stopped (01/10/18 0318)  . sodium chloride       LOS: 28 days     Berle Mull, MD [facility] 01/14/2018, 6:07 PM  If 7PM-7AM, please contact  night-coverage [email or web address] 01/14/2018, 6:07 PM

## 2018-01-14 NOTE — Progress Notes (Signed)
Occupational Therapy Treatment Patient Details Name: Tony [name] MRN: [id number] DOB: [birth year] Today's Date: 01/14/2018    History of present illness 52 year old man PMH critical limb ischemia, status post aortobifemoral bypass, right femoral artery to popliteal bypass, status post thrombectomy left limb aVF bypass.  Discharged on Xarelto.  Presented 7/8 with drainage from left groin wound and left below-knee incision.  Admitted by vascular surgery and started on IV antibiotics.  Because of neck pain, further imaging was obtained which revealed spiculated lung mass concerning for malignancy, patient transferred to hospitalist service. Pt found to have metastatic adenocarcinoma.    OT comments  Pt with increased I with this day.  Follow Up Recommendations  Supervision - Intermittent          Precautions / Restrictions Precautions Precautions: Fall Precaution Comments: R neck and UE edema, gangrenous R toes  Restrictions Weight Bearing Restrictions: No       Mobility Bed Mobility Overal bed mobility: Modified Independent                Transfers Overall transfer level: Needs assistance Equipment used: Rolling walker (2 wheeled) Transfers: Sit to/from [facility] Sit to Stand: Supervision Stand pivot transfers: Supervision       General transfer comment: with walker        ADL either performed or assessed with clinical judgement   ADL Overall ADL's : Needs assistance/impaired                     Lower Body Dressing: Supervision/safety;Sit to/from stand;Cueing for sequencing;Cueing for safety   Toilet Transfer: Supervision/safety;RW;Comfort height toilet;Ambulation   Toileting- Clothing Manipulation and Hygiene: Supervision/safety;Sit to/from stand;Cueing for sequencing;Cueing for safety         General ADL Comments: Pt did not have on TED hose or UE compression wrap.  BLE as well as RUE with edema.  Pt did have BLE and RUE  elevated.  Pt did walk in hall with OT and used walker.  Pt did feel he was safer with a walker rather than a cane               Cognition Arousal/Alertness: Awake/alert Behavior During Therapy: WFL for tasks assessed/performed Overall Cognitive Status: Within Functional Limits for tasks assessed                                                     Pertinent Vitals/ Pain       Faces Pain Scale: Hurts even more Pain Location: swelling in R LE, R neck Pain Descriptors / Indicators: Discomfort;Sore Pain Intervention(s): Limited activity within patient's tolerance;Monitored during session;Premedicated before session;Repositioned     Prior Functioning/Environment              Frequency  Min 2X/week        Progress Toward Goals  OT Goals(current goals can now be found in the care plan section)  Progress towards OT goals: Progressing toward goals  Acute Rehab OT Goals Patient Stated Goal: have pain under control before return home  Plan Discharge plan remains appropriate    Co-evaluation                 AM-PAC PT "6 Clicks" Daily Activity     Outcome Measure   Help from another person eating meals?: None Help from another  person taking care of personal grooming?: None Help from another person toileting, which includes using toliet, bedpan, or urinal?: A Little Help from another person bathing (including washing, rinsing, drying)?: A Little Help from another person to put on and taking off regular upper body clothing?: None Help from another person to put on and taking off regular lower body clothing?: A Little 6 Click Score: 21    End of Session Equipment Utilized During Treatment: Gait belt;Rolling walker  OT Visit Diagnosis: Muscle weakness (generalized) (M62.81) Pain - part of body: Shoulder;Arm   Activity Tolerance Patient tolerated treatment well   Patient Left in bed;with call bell/phone within reach;with family/visitor  present   Nurse Communication Mobility status;Other (comment)        Time: 5009-3818 OT Time Calculation (min): 24 min  Charges: OT General Charges $OT Visit: 1 Visit OT Treatments $Self Care/Home Management : 23-37 mins  [place], [place]   Payton Mccallum D 01/14/2018, 4:08 PM

## 2018-01-15 ENCOUNTER — Ambulatory Visit
Admit: 2018-01-15 | Discharge: 2018-01-15 | Disposition: A | Discharge status: Home / Self Care | Payer: BLUE CROSS/BLUE SHIELD | Attending: Radiation Oncology | Admitting: Radiation Oncology

## 2018-01-15 MED ORDER — METHADONE HCL 5 MG PO TABS
17.5000 mg | ORAL_TABLET | Freq: Three times a day (TID) | ORAL | Status: DC
  Administered 2018-01-15 – 2018-01-19 (×11): 17.5 mg via ORAL
  Filled 2018-01-15 (×12): qty 2

## 2018-01-15 MED ORDER — HYDROMORPHONE HCL 8 MG PO TABS
8.0000 mg | ORAL_TABLET | ORAL | Status: DC | PRN
  Administered 2018-01-16 (×4): 8 mg via ORAL
  Filled 2018-01-15 (×4): qty 1

## 2018-01-15 MED ORDER — METHOCARBAMOL 500 MG PO TABS
1000.0000 mg | ORAL_TABLET | Freq: Three times a day (TID) | ORAL | Status: DC
  Administered 2018-01-15 – 2018-01-16 (×2): 1000 mg via ORAL
  Filled 2018-01-15 (×2): qty 2

## 2018-01-15 MED ORDER — HYDROMORPHONE HCL 1 MG/ML IJ SOLN
1.0000 mg | INTRAMUSCULAR | Status: DC | PRN
  Administered 2018-01-15 – 2018-01-17 (×12): 1 mg via INTRAVENOUS
  Filled 2018-01-15 (×12): qty 1

## 2018-01-15 NOTE — Progress Notes (Signed)
OT Cancellation Note  Patient Details Name: Tony [name] MRN: [id number] DOB: 1966/01/06   Cancelled Treatment:    Reason Eval/Treat Not Completed: Other (comment).  Pt is moving around at supervision level, managing edema as best he can, and wife will assist if needed. Will sign off from [place] 01/15/2018, 3:04 PM  Lesle Chris, OTR/L [phone] 01/15/2018

## 2018-01-15 NOTE — Progress Notes (Signed)
Daily Progress Note   Patient Name: Tony [name]       Date: 01/15/2018 DOB: 22-Aug-1965  Age: 52 y.o. MRN#: [id number] Attending Physician: Tony Hamman, MD Primary Care Physician: Tony Amel, MD Admit Date: 12/17/2017  Reason for Consultation/Follow-up: Pain control, psychosocial support  Subjective: OOB in chair. High anticipatory anxiety.  Chart reviewed today in detail 8/6 discussed care of patient with Dr. Domingo [name]. 8/5 (note) MAR reveals rescue medication in last 24 hours with oral morphine equivalent of 400mg  of oral morphine (~650mg  oral morphine equivalent in 24 hours prior to this.  Discussed with bedside RN.  Tony [name] is sitting on window seat on entering room.  Reports pain is "still there but tolerable".  Discussed plan for continued breakthrough medications as needed while waiting for methadone to reach steady state.  Niece and his wife were at bedside at time of encounter.  Length of Stay: 29  Current Medications: Scheduled Meds:  . acetaminophen  1,000 mg Oral TID  . atorvastatin  40 mg Oral Daily  . dexamethasone  4 mg Intravenous BID  . dronabinol  2.5 mg Oral BID AC  . DULoxetine  60 mg Oral Daily  . feeding supplement  1 Container Oral TID BM  . fondaparinux (ARIXTRA) injection  7.5 mg Subcutaneous q1800  . gabapentin  400 mg Oral TID  . hydrochlorothiazide  25 mg Oral Daily  . hydrocortisone   Rectal BID  . irbesartan  150 mg Oral Daily  . lidocaine  1 patch Transdermal Q24H  . LORazepam  1 mg Oral BID  . LORazepam  1.5 mg Oral QHS  . magnesium hydroxide  15 mL Oral Daily  . methadone  15 mg Oral Q8H  . methocarbamol  1,000 mg Oral Q6H  . metoprolol succinate  100 mg Oral QHS  . nutrition supplement (JUVEN)  1 packet Oral BID BM  . OLANZapine  5  mg Oral QHS  . pantoprazole  40 mg Oral Daily  . polyethylene glycol  17 g Oral Daily  . senna-docusate  2 tablet Oral BID  . sodium chloride flush  3 mL Intravenous Q12H    Continuous Infusions: . sodium chloride Stopped (01/10/18 0318)  . sodium chloride      PRN Meds: sodium chloride, sodium chloride, alum & mag hydroxide-simeth, bisacodyl,  guaiFENesin-dextromethorphan, hydrALAZINE, HYDROmorphone (DILAUDID) injection, HYDROmorphone, labetalol, lactulose, naLOXone (NARCAN)  injection, ondansetron, phenol, povidone-iodine, promethazine, sodium chloride flush          He is alert awake and interactive. Pain appears under good control-no distress or grimacing. Affect remains slightly flat, but he isn't withdrawn. Neck swelling, guards right extremity.  Vital Signs: BP (!) 150/88 (BP Location: Left Arm)   Pulse (!) 108   Temp 98.8 F (37.1 C) (Oral)   Resp 18   Ht 6\' 5"  (1.956 m)   Wt 86.1 kg (189 lb 12.8 oz)   SpO2 97%   BMI 22.51 kg/m  SpO2: SpO2: 97 % O2 Device: O2 Device: Room Air O2 Flow Rate: O2 Flow Rate (L/min): 2 L/min  Intake/output summary:   Intake/Output Summary (Last 24 hours) at 01/15/2018 1454 Last data filed at 01/14/2018 2000 Gross per 24 hour  Intake 240 ml  Output -  Net 240 ml   LBM: Last BM Date: 01/15/18 Baseline Weight: Weight: 71.6 kg (157 lb 13.6 oz) Most recent weight: Weight: 86.1 kg (189 lb 12.8 oz)       Palliative Assessment/Data: 50%   Flowsheet Rows     Most Recent Value  Intake Tab  Referral Department  Surgery  Palliative Care Primary Diagnosis  Cancer  Date Notified  12/25/17  Palliative Care Type  New Palliative care  Reason for referral  Clarify Goals of Care, Non-pain Symptom  Date of Admission  12/17/17  Date first seen by Palliative Care  12/26/17  # of days Palliative referral response time  1 Day(s)  # of days IP prior to Palliative referral  8  Clinical Assessment  Psychosocial & Spiritual Assessment  Palliative Care  Outcomes      Patient Active Problem List   Diagnosis Date Noted  . Counseling regarding advance care planning and goals of care 01/01/2018  . Adenocarcinoma ([place])   . SVC syndrome 12/31/2017  . Adenocarcinoma of left lung, stage 4 ([place]) 12/31/2017  . Mass in neck   . Neck pain due to malignant neoplastic disease ([place])   . Palliative care by specialist   . Uncontrolled pain   . Palliative care encounter   . Malignant neoplasm of upper lobe of right lung ([place]) 12/25/2017  . Gangrene of right foot ([place]) 12/20/2017  . AKI (acute kidney injury) ([place]) 12/20/2017  . PAD (peripheral artery disease) ([place]) 12/19/2017  . S/P aortobifemoral bypass surgery 12/17/2017  . Lung mass 12/17/2017  . Postoperative wound infection 12/17/2017  . Weight loss 12/17/2017  . Cervical lymphadenopathy 12/17/2017  . DVT (deep venous thrombosis) ([place]) 12/17/2017  . Aortoiliac occlusive disease ([place]) 11/15/2017  . Pain of right lower extremity due to ischemia 11/13/2017  . Hyperlipidemia LDL goal <70 11/13/2017  . PVD (peripheral vascular disease) ([place]) 11/11/2017  . H. pylori infection 10/11/2011  . Duodenal ulcer 08/23/2011  . IBS (irritable bowel syndrome) 08/18/2011  . Depression with anxiety 05/11/2009  . EPIGASTRIC PAIN, CHRONIC 05/11/2009  . SMOKER 09/01/2008  . INSOMNIA, CHRONIC 09/01/2008  . Essential hypertension 09/01/2008  . PALPITATIONS, CHRONIC 09/01/2008  . GASTRITIS 10/24/2007    Palliative Care Assessment & Plan   Patient Narrative and Events: 52 y.o. male  with past medical history of HTN, HLD, arterial occlusive disease, PVD, IBS,  chronic back pain, anxiety, depression admitted on 12/17/2017 with increased drainage from incision sites and with critical limb ischemia of left lower extremity. S/P  Right Aortobifemoral bypass and fem-pop bypass 6/6 and found  to thrombectomy with fem-pop bypass on left 6/22. Hospitalization complicated by uncontrolled pain likely s/t newly diagnosed  metastatic adenocarcinoma with significant lymphadenopathy to neck. Also with venous thrombus of right subclavian, brachiocephalic, and IJ veins.   Recommendations/Plan:  Poor intake: Continue Boost/Breeze TID. Zyprexa + marinol.   Bowel Regimen: Senokot qhs. Confirmed having daily BM.   Anxiety: Continue  Ativan 1 mg TID + q8h prn. Nighttime dose Ativan po to 1.5 mg.  On cymbalta. Continue trial of zyprexa.  Right neck pain (r/t cancer/clots)-brachial plexus involved:  Adjuvant interventions maximized: ? Ice pack 15 min every 4 hours.  ? Tylenol 1000 mg po TID (has been refusing) ? Continue gabapentin 400 TID.  ? Cymbalta 60 mg daily.  ? Decadron 4 mg IV TID- can switch this to PO and begin to taper. ? Continue Robaxin 1000 mg QID - spread out to q6h to receive dosing at night- can start to decrease this dosing interval.   Complex Opioid Titration:  480 Oral Morphine Equivalents (400 yesterday)  Started methadone 15 mg TID beginning 01/10/18 2200.  Stopped TD fentanyl 8/2  8/6 Still using frequent rescue medications oral and IV dilaudid- calculated total methadone dose is actually  somewhere around 25mg  TID with 25% reduction for cross tolerance- given his other opioids and sedating medications I will increase his methadone by 1/3 with slight reduction and decrease his other opioids by 1/3. He needs a highly monitored setting for this titration given his complexity- will need 3 days monitored before next dose titration.  Discharge Planning: I discussed the following discharge plan with him and he is agreeable but does have anticipatory anxiety issues- his biggest fear is once he leaves he will have another pain crisis or get worse and not have the help he needs:   He will be discharged when it is safe and he is medically stable meaning steady state with methadone (he may need another 3-4 days here but will assess his needs daily), off IV dilaudid and reduced need for breakthrough  pain medication and an outpatient care plan.  Outpatient plan to include:   Follow up with Oncology and continue with scheduled radiation treatment plan.  Will need to continue Arixrtra/Anticoagulation  Care Connections Program- I will discuss with their medical director and I have confirmed coverage for this service. This will provide them with ongoing symptom management, emotional coping support and social work services, nurse monitoring, access to help  if they have questions or problems.  Narcan and emergency unintentional overdose plan for his safety.  In Home physical [place] Referral  May need home health nursing for lower extremity wound care.  This gentleman will benefit from any wrap around services available to ease his anxiety and for him to feel supported.  Assessment for any DME needs  Code Status:  Full code  Prognosis:  Overall poor prognosis with stage IV cancer newly diagnosed and gangrene to right foot. Attempt to better manage pain/symptoms to see if functional status may improve. Prognosis may impacted by his ability to tolerate cancer treatment. He has advanced peripheral vascular disease which put him at higher than average risk for sudden death or decline.  Discharge Planning:  To Be Determined  Care plan was discussed with bedside RN.  Thank you for allowing the Palliative Medicine Team to assist in the care of this patient.   Total Time 60 min Prolonged Time Billed  no      Greater than 50%  of this  time was spent counseling and coordinating care related to the above assessment and plan.  [name], DO Palliative Medicine

## 2018-01-15 NOTE — Progress Notes (Signed)
PROGRESS NOTE    Tony [name]  VOJ:[id number] DOB: [birth year] DOA: 12/17/2017 PCP: [name], MD   Brief Narrative: Tony [name] is a 52 y.o. male with a history of PAD s/p fem-fem bypass, thrombectomy, post-op wound infection. Patient with new diagnosis of lung mass concerning for poorly differentiated adenocarcinoma of the lung. Patient is undergoing radiation therapy and medical oncology planning on starting chemotherapy. Hospitalization complicated by poorly controlled pain.   Assessment & Plan: Poorly differentiated adenocarcinoma, lung primary. Metastatic. Cervical, mediastinal and axillary lymphadenopathy. SVC syndrome.  Upper GI series was negative for evidence of malignancy Oncology/radiation oncology recommendations: planning chemotherapy at some point; preferably outpatient  On anti-coagulation prior to admission, currently on Arixtra, awaiting answer from oncology regarding switching back to Xarelto.  Radiation oncology consulted for SVC syndrome and patient is currently receiving radiation therapy, which is for a total of 32 therapy session.  Pain control is difficult.  Managed by palliative care team. Appreciate their assistance.  Discussed with oncology 01/09/2018 to see whether vascular surgery would have any been put and feels that no indication for endovascular surgery were normal right now. Discussed with radiation oncology 01/11/2018, They recommended that no indication at present to attempt other therapy for SVC syndrome. Discussed with vascular surgery Dr. Scot Dock on 01/11/2018, recommend to complete radiation therapy before considering endovascular approach for SVC syndrome.  DVT Upper extremity Multiple. Patient on Xarelto as an outpatient. Currently on Arixtra -Per oncology -Will need plan for transition to oral regimen  Right foot gangrene Plan for outpatient management per vascular surgery. No plan for surgical management inpatient.  Discussed  with Dr. Scot Dock  Pain control secondary to malignancy. Difficult to manage Palliative care on board to help with management. Patient is on methadone 15 mg 3 times daily-dose increased today. Per palliative care patient need to be monitored for 3 more days before further dose titration. Robaxin scheduled, Decadron scheduled, Cymbalta daily, gabapentin scheduled. Patient is also on IV Dilaudid as needed.  Ideally patient will benefit from PCA pump for pain control which he is refusing. Remeron discontinued to Zyprexa for anxiety Very thankful for [facility] management Palliative care is arranging outpatient pain control as well.  Anxiety Bipolar disorder On Zyprexa, Ativan and Cymbalta.   Heavily contributing to patient's uncontrolled pain and requirement of IV pain medications.  Left groin wound Left leg wound Discussed wound with vascular surgery on 7/26. No recommendations. -Continue dressing changes  Anemia Normocytic. Chronic disease. Stable. Hematology recommending to transfuse for hemoglobin less than 8  Cellulitis In setting of radiation therapy. WBC elevated but is now trended down slightly. Significant tenderness with associated erythema. CT chest significant for no abscess Patient was treated with IV cefazolin changed to Keflex. Blood cultures no growth to date. I suspect this is more likely radiation dermatitis rather than a cellulitis but patient is responding to IV antibiotics therefore I complete the course. Discussed with radiation oncology 01/11/2018, for possible radiation dermatitis they will give him ointment.  Severe constipation. Bright blood per rectum. Minimal amount, monitor H&H, transfuse for hemoglobin less than 8. Aggressive bowel regimen initiated with Senokot as 2 tablets twice daily, scheduled MiraLAX ,Anusol ointment, scheduled milk of magnesia.  Currently resolved.  DVT prophylaxis: Arixtra, . Code Status:   Code Status: Full Code Family Communication:  sister and wife at bedside Disposition Plan: Discharge pending better pain control   Consultants:   Palliative care  Vascular surgery  Medical oncology  Procedures:   XRT  Antimicrobials:  Vancomycin (7/8>>7/10)  Zosyn (7/8>> 7/10)  Unasyn (7/11>>7/13)  Augmentin (7/13>>7/21)   Subjective:  Arm swelling is significantly reduced.  As well as swelling at the neck.  Feeling better.  Objective: Vitals:   01/14/18 2030 01/15/18 0606 01/15/18 1402 01/15/18 1403  BP: (!) 147/81 129/64 (!) 150/88   Pulse: (!) 108 85 (!) 108 (!) 108  Resp: 18 18 18    Temp: 98.8 F (37.1 C) 99.1 F (37.3 C) 98.8 F (37.1 C)   TempSrc: Oral Oral Oral   SpO2: 96% 95% 97% 97%  Weight:      Height:        Intake/Output Summary (Last 24 hours) at 01/15/2018 1756 Last data filed at 01/14/2018 2000 Gross per 24 hour  Intake 240 ml  Output -  Net 240 ml   Filed Weights   12/18/17 1613 01/01/18 1100  Weight: 71.6 kg (157 lb 13.6 oz) 86.1 kg (189 lb 12.8 oz)    Examination:  General exam: Appears calm and comfortable Respiratory system: Respiratory effort normal. Gastrointestinal system: Abdomen is nondistended  Central nervous system: Alert and oriented. No focal neurological deficits. Extremities: Right arm edema. No calf tenderness Skin: No cyanosis. Erythema around right neck and upper chest   Data Reviewed: I have personally reviewed following labs and imaging studies  CBC: Recent Labs  Lab 01/10/18 0425 01/13/18 0449  WBC 11.8* 10.2  NEUTROABS 10.7* 9.4*  HGB 8.5* 7.9*  HCT 28.0* 25.6*  MCV 93.3 93.8  PLT 312 277   Basic Metabolic Panel: Recent Labs  Lab 01/10/18 0425 01/13/18 0449  NA 143 140  K 4.4 4.5  CL 102 99  CO2 30 29  GLUCOSE 93 105*  BUN 18 17  CREATININE 0.68 0.72  CALCIUM 8.4* 8.5*  MG 2.0  --    GFR: Estimated Creatinine Clearance: 136 mL/min (by C-G formula based on SCr of 0.72 mg/dL). Liver Function Tests: Recent Labs  Lab  01/10/18 0425  AST 44*  ALT 91*  ALKPHOS 144*  BILITOT 0.3  PROT 6.5  ALBUMIN 2.6*   No results for input(s): LIPASE, AMYLASE in the last 168 hours. No results for input(s): AMMONIA in the last 168 hours. Coagulation Profile: No results for input(s): INR, PROTIME in the last 168 hours. Cardiac Enzymes: No results for input(s): CKTOTAL, CKMB, CKMBINDEX, TROPONINI in the last 168 hours. BNP (last 3 results) No results for input(s): PROBNP in the last 8760 hours. HbA1C: No results for input(s): HGBA1C in the last 72 hours. CBG: No results for input(s): GLUCAP in the last 168 hours. Lipid Profile: No results for input(s): CHOL, HDL, LDLCALC, TRIG, CHOLHDL, LDLDIRECT in the last 72 hours. Thyroid Function Tests: No results for input(s): TSH, T4TOTAL, FREET4, T3FREE, THYROIDAB in the last 72 hours. Anemia Panel: No results for input(s): VITAMINB12, FOLATE, FERRITIN, TIBC, IRON, RETICCTPCT in the last 72 hours. Sepsis Labs: No results for input(s): PROCALCITON, LATICACIDVEN in the last 168 hours.  Recent Results (from the past 240 hour(s))  Culture, blood (routine x 2)     Status: None   Collection Time: 01/06/18 11:29 AM  Result Value Ref Range Status   Specimen Description   Final    BLOOD LEFT ANTECUBITAL Performed at [facility] [address], [place], [place] [address]    Special Requests   Final    BOTTLES DRAWN AEROBIC ONLY Blood Culture adequate volume Performed at [facility] [address] [place],  [address]  Culture   Final    NO GROWTH 5 DAYS Performed at [facility], [place] [address], [place], [place] [address]    Report Status 01/11/2018 FINAL  Final  Culture, blood (routine x 2)     Status: None   Collection Time: 01/06/18 11:30 AM  Result Value Ref Range Status   Specimen Description   Final    BLOOD LEFT WRIST Performed at [facility] [address] [place], [place]  [address]    Special Requests   Final    BOTTLES DRAWN AEROBIC ONLY Blood Culture adequate volume Performed at [facility] [address], [place], [place] [address]    Culture   Final    NO GROWTH 5 DAYS Performed at [facility], [place] [address] [place], [place] [address]    Report Status 01/11/2018 FINAL  Final         Radiology Studies: No results found.      Scheduled Meds: . acetaminophen  1,000 mg Oral TID  . atorvastatin  40 mg Oral Daily  . dexamethasone  4 mg Intravenous BID  . dronabinol  2.5 mg Oral BID AC  . DULoxetine  60 mg Oral Daily  . feeding supplement  1 Container Oral TID BM  . fondaparinux (ARIXTRA) injection  7.5 mg Subcutaneous q1800  . gabapentin  400 mg Oral TID  . hydrochlorothiazide  25 mg Oral Daily  . hydrocortisone   Rectal BID  . irbesartan  150 mg Oral Daily  . lidocaine  1 patch Transdermal Q24H  . LORazepam  1 mg Oral BID  . LORazepam  1.5 mg Oral QHS  . magnesium hydroxide  15 mL Oral Daily  . methadone  17.5 mg Oral Q8H  . methocarbamol  1,000 mg Oral TID  . metoprolol succinate  100 mg Oral QHS  . nutrition supplement (JUVEN)  1 packet Oral BID BM  . OLANZapine  5 mg Oral QHS  . pantoprazole  40 mg Oral Daily  . polyethylene glycol  17 g Oral Daily  . senna-docusate  2 tablet Oral BID  . sodium chloride flush  3 mL Intravenous Q12H   Continuous Infusions: . sodium chloride Stopped (01/10/18 0318)  . sodium chloride       LOS: 29 days     Berle Mull, MD [facility] 01/15/2018, 5:56 PM  If 7PM-7AM, please contact night-coverage [email or web address] 01/15/2018, 5:56 PM

## 2018-01-15 NOTE — Progress Notes (Signed)
Nutrition Follow-up  DOCUMENTATION CODES:   Not applicable  INTERVENTION:    Boost Breeze po TID, each supplement provides 250 kcal and 9 grams of protein  Juven Fruit Punch BID, each serving provides 95kcal and 2.5g of protein (amino acids glutamine and arginine)  Obtain recent weight  NUTRITION DIAGNOSIS:   Increased nutrient needs related to cancer and cancer related treatments, wound healing as evidenced by estimated needs.  Ongoing  GOAL:   Patient will meet greater than or equal to 90% of their needs  Meeting PO   MONITOR:   PO intake, Supplement acceptance, Weight trends, Labs  REASON FOR ASSESSMENT:   LOS    ASSESSMENT:   Patient with PMH significant for HTN, IBS, and gastric ulcer. Presented to [facility] 7/8 with right foot pain and discoloration. He was found to have left groin infection, PAD with right foot gangrene, and poorly differentiated adenocarcinoma of unclear primary- suspect lung cancer in right upper lobe. Was transferred to [name] 7/21 to begin palliative radiation treatments for pain control.    7/22- started palliative radiation  Pt's appetite is stable. He claims to have noticed taste changes within the recent week but marinol helps to keep his appetite strong. Meal completions charted as 50-100% for his last eight meals. He consumed a grilled cheese for lunch without complication. He transitioned to drinking Juven BID as we discussed and continues to drink Boost with breakfast. Will need to obtain new weight to monitor trends.   Medications reviewed and include: marinol BID Labs reviewed: AST 44 (H) ALT 91 (H)   Diet Order:   Diet Order           Diet regular Room service appropriate? Yes; Fluid consistency: Thin  Diet effective now          EDUCATION NEEDS:   Education needs have been addressed  Skin:  Skin Assessment: Skin Integrity Issues: Skin Integrity Issues:: Incisions, Other (Comment) Incisions: neck, left leg Other: toes on  right foot demarcating   Last BM:  01/15/18  Height:   Ht Readings from Last 1 Encounters:  01/01/18 6\' 5"  (1.956 m)    Weight:   Wt Readings from Last 1 Encounters:  01/01/18 189 lb 12.8 oz (86.1 kg)    Ideal Body Weight:  94.5 kg  BMI:  Body mass index is 22.51 kg/m.  Estimated Nutritional Needs:   Kcal:  2500-2700 kcal  Protein:  125-135 grams  Fluid:  >2.5 L/day    Mariana Single RD, LDN Clinical Nutrition Pager # - [phone]

## 2018-01-15 NOTE — Progress Notes (Signed)
[facility] Therapy Treatment Record Phone [phone]   Radiation Therapy was administered to [name] on: 01/15/2018  1:07 PM and was treatment # 12 out of a planned course of 32 treatments.  Radiation Treatment  1). Beam photons with 6-10 energy  2). Brachytherapy None  3). Stereotactic Radiosurgery None  4). Other Radiation None     Tony [name]

## 2018-01-15 NOTE — Progress Notes (Signed)
Physical Therapy Treatment Patient Details Name: Tony [name] MRN: [id number] DOB: 1965/08/01 Today's Date: 01/15/2018    History of Present Illness 52 year old man PMH critical limb ischemia, status post aortobifemoral bypass, right femoral artery to popliteal bypass, status post thrombectomy left limb aVF bypass.  Discharged on Xarelto.  Presented 7/8 with drainage from left groin wound and left below-knee incision.  Admitted by vascular surgery and started on IV antibiotics.  Because of neck pain, further imaging was obtained which revealed spiculated lung mass concerning for malignancy, patient transferred to hospitalist service. Pt found to have metastatic adenocarcinoma.     PT Comments    Pt ambulated in hallway and took one seated rest break due to slight dizziness.  Pt's mobility is improving.  Pt encouraged to continue ambulating during acute stay with nursing staff.  Pt's frequency decreased to 1x a week due to LOS (admission date 12/17/17) and improved progress.  If pt mobilizing well and can tolerate steps next visit then may possibly d/c from acute PT.   Follow Up Recommendations  Home health PT;Supervision - Intermittent     Equipment Recommendations  None recommended by PT    Recommendations for Other Services       Precautions / Restrictions Precautions Precautions: Fall Precaution Comments: R neck and UE edema, gangrenous R toes  Restrictions Weight Bearing Restrictions: No    Mobility  Bed Mobility Overal bed mobility: Modified Independent                Transfers Overall transfer level: Needs assistance Equipment used: Rolling walker (2 wheeled) Transfers: Sit to/from Stand Sit to Stand: Supervision            Ambulation/Gait Ambulation/Gait assistance: Supervision Gait Distance (Feet): 600 Feet Assistive device: Rolling walker (2 wheeled) Gait Pattern/deviations: Step-through pattern;Decreased stride length     General Gait Details: pt  required one seated rest break due to slight dizziness however able to continue, pt appears steady with RW   Stairs             Wheelchair Mobility    Modified Rankin (Stroke Patients Only)       Balance                                            Cognition Arousal/Alertness: Awake/alert Behavior During Therapy: WFL for tasks assessed/performed Overall Cognitive Status: Within Functional Limits for tasks assessed                                        Exercises      General Comments        Pertinent Vitals/Pain Pain Assessment: No/denies pain Pain Intervention(s): Premedicated before session;Repositioned    Home Living                      Prior Function            PT Goals (current goals can now be found in the care plan section) Acute Rehab PT Goals PT Goal Formulation: With patient Time For Goal Achievement: 01/29/18 Potential to Achieve Goals: Good Progress towards PT goals: Progressing toward goals    Frequency    Min 1X/week      PT Plan Current plan remains appropriate    Co-evaluation  AM-PAC PT "6 Clicks" Daily Activity  Outcome Measure  Difficulty turning over in bed (including adjusting bedclothes, sheets and blankets)?: None Difficulty moving from lying on back to sitting on the side of the bed? : None Difficulty sitting down on and standing up from a chair with arms (e.g., wheelchair, bedside commode, etc,.)?: None Help needed moving to and from a bed to chair (including a wheelchair)?: None Help needed walking in hospital room?: A Little Help needed climbing 3-5 steps with a railing? : A Little 6 Click Score: 22    End of Session   Activity Tolerance: Patient tolerated treatment well Patient left: with call bell/phone within reach;in chair;with nursing/sitter in room Nurse Communication: Mobility status PT Visit Diagnosis: Other abnormalities of gait and mobility  (R26.89)     Time: 7014-1030 PT Time Calculation (min) (ACUTE ONLY): 19 min  Charges:  $Gait Training: 8-22 mins                     Carmelia Bake, PT, DPT 01/15/2018 Pager: [phone]  [name] 01/15/2018, 12:14 PM

## 2018-01-16 ENCOUNTER — Encounter (HOSPITAL_COMMUNITY): Payer: Self-pay | Admitting: Radiology

## 2018-01-16 ENCOUNTER — Inpatient Hospital Stay (HOSPITAL_COMMUNITY): Payer: BLUE CROSS/BLUE SHIELD

## 2018-01-16 ENCOUNTER — Ambulatory Visit
Admit: 2018-01-16 | Discharge: 2018-01-16 | Disposition: A | Discharge status: Home / Self Care | Payer: BLUE CROSS/BLUE SHIELD | Attending: Radiation Oncology | Admitting: Radiation Oncology

## 2018-01-16 ENCOUNTER — Encounter (HOSPITAL_COMMUNITY): Payer: Self-pay | Admitting: Hematology

## 2018-01-16 DIAGNOSIS — B370 Candidal stomatitis: Secondary | ICD-10-CM | POA: Diagnosis not present

## 2018-01-16 DIAGNOSIS — B3781 Candidal esophagitis: Secondary | ICD-10-CM

## 2018-01-16 LAB — CBC
HCT: 23.8 % — ABNORMAL LOW (ref 39.0–52.0)
Hemoglobin: 7.5 g/dL — ABNORMAL LOW (ref 13.0–17.0)
MCH: 28.6 pg (ref 26.0–34.0)
MCHC: 31.5 g/dL (ref 30.0–36.0)
MCV: 90.8 fL (ref 78.0–100.0)
PLATELETS: 342 10*3/uL (ref 150–400)
RBC: 2.62 MIL/uL — ABNORMAL LOW (ref 4.22–5.81)
RDW: 16.6 % — AB (ref 11.5–15.5)
WBC: 9.5 10*3/uL (ref 4.0–10.5)

## 2018-01-16 LAB — BASIC METABOLIC PANEL
Anion gap: 11 (ref 5–15)
BUN: 19 mg/dL (ref 6–20)
CO2: 29 mmol/L (ref 22–32)
CREATININE: 0.69 mg/dL (ref 0.61–1.24)
Calcium: 8.7 mg/dL — ABNORMAL LOW (ref 8.9–10.3)
Chloride: 100 mmol/L (ref 98–111)
GFR calc Af Amer: >60 mL/min (ref >60)
GLUCOSE: 123 mg/dL — AB (ref 70–99)
Potassium: 4.2 mmol/L (ref 3.5–5.1)
SODIUM: 140 mmol/L (ref 135–145)

## 2018-01-16 MED ORDER — MAGIC MOUTHWASH
15.0000 mL | Freq: Three times a day (TID) | ORAL | Status: DC | PRN
  Administered 2018-01-16 – 2018-01-22 (×8): 15 mL via ORAL
  Filled 2018-01-16 (×9): qty 15

## 2018-01-16 MED ORDER — METHOCARBAMOL 500 MG PO TABS
1000.0000 mg | ORAL_TABLET | Freq: Four times a day (QID) | ORAL | Status: DC
  Administered 2018-01-16 – 2018-01-23 (×27): 1000 mg via ORAL
  Filled 2018-01-16 (×28): qty 2

## 2018-01-16 MED ORDER — IOHEXOL 300 MG/ML  SOLN
75.0000 mL | Freq: Once | INTRAMUSCULAR | Status: AC | PRN
  Administered 2018-01-16: 75 mL via INTRAVENOUS

## 2018-01-16 MED ORDER — SUCRALFATE 1 G PO TABS
1.0000 g | ORAL_TABLET | Freq: Three times a day (TID) | ORAL | Status: DC
  Administered 2018-01-16 – 2018-01-21 (×18): 1 g via ORAL
  Filled 2018-01-16 (×19): qty 1

## 2018-01-16 MED ORDER — DEXAMETHASONE SODIUM PHOSPHATE 4 MG/ML IJ SOLN
4.0000 mg | Freq: Four times a day (QID) | INTRAMUSCULAR | Status: DC
  Administered 2018-01-17 – 2018-01-20 (×15): 4 mg via INTRAVENOUS
  Filled 2018-01-16 (×15): qty 1

## 2018-01-16 MED ORDER — FLUCONAZOLE 100 MG PO TABS
100.0000 mg | ORAL_TABLET | Freq: Every day | ORAL | Status: DC
  Administered 2018-01-16 – 2018-01-23 (×8): 100 mg via ORAL
  Filled 2018-01-16 (×8): qty 1

## 2018-01-16 MED ORDER — HYDROCORTISONE 1 % EX CREA
TOPICAL_CREAM | Freq: Two times a day (BID) | CUTANEOUS | Status: DC
  Administered 2018-01-16 – 2018-01-17 (×2): 1 via TOPICAL
  Administered 2018-01-17 – 2018-01-19 (×5): via TOPICAL
  Administered 2018-01-20: 1 via TOPICAL
  Administered 2018-01-21: 10:00:00 via TOPICAL
  Administered 2018-01-21 – 2018-01-22 (×2): 1 via TOPICAL
  Administered 2018-01-22 – 2018-01-23 (×2): via TOPICAL
  Filled 2018-01-16 (×2): qty 28

## 2018-01-16 NOTE — Progress Notes (Signed)
PROGRESS NOTE [facility]   Tony [name]   DJM:[id number] DOB: 1966-05-23  DOA: 12/17/2017 PCP: [name], MD   Brief Narrative:  Tony [name]    Subjective: Patient seen and examined, he had a rough night, with increase in pain. Also complaining of neck swelling and stuck saliva on the back of the throat "unable to hack up". Wife is reporting that he is been apneic while sleeping, however no desat.   Assessment & Plan: Metastatic Poorly differentiated adenocarcinoma,  lung primary Cervical mediastinal and axillary lymphadenopathy and SCV syndrome Prolonged hospital stay due to pain control and complication with SVC syndrome along with lymphadenopathy.  Radiation oncology and vascular surgery were consulted. Currently receiving radiation, per oncology planning for chemotherapy at some point however this will be performed as an outpatient. Swelling of the right arm has significantly decreased.  Vascular surgery recommending to complete radiation therapy before considering endovascular approach for SVC.  Palliative care team has been consulted and helping with pain management.  DVT upper extremity Multiple place patient currently on Arixtras, per oncology Prior to admission used to be on Xarelto will discuss with oncology if patient will continue current anticoagulation regimen or ultimately discharge on Xarelto.  Pain due to malignancy Has been difficult to manage, palliative care now involved on management of the pain. Patient is currently on methadone, he is also receiving Robaxin and Decadron.  Goal is to decrease IV Dilaudid and use oral hydromorphone for breakthrough pain along with prolonged.  Patient also on Cymbalta and gabapentin.  Hopefully with reach methadone steady-state soon. Continue management per Palliative team. Outpatient follow up has been arranged  Right foot gangrene Per vascular surgery planning for outpatient management  Anxiety and bipolar  disorder On Zyprexa, Ativan and Cymbalta this is heavily contributed to uncontrolled pain.  Radiation dermatitis Unlikely to be cellulitis, however patient was treated with cefazolin and Keflex.  Will start hydrocortisone 1% twice a day.  Neck swelling and ? Difficulty swallowing  neck swelling likely to be related to radiation and SVC, difficulty swallowing and increased in reflux likely to be related to oral thrush versus radiation esophagitis.  Will start fluconazole and Carafate.  Patient already on PPI.  CT of the neck has been ordered. Will increase decadron to q 6 hrs.   Anemia of chronic disease Hemoglobin stable, confused if hemoglobin less than 8 per oncology  Severe constipation - resolved Continue bowel regimen  DVT prophylaxis: Arixtra Code Status: Full code Family Communication: Family at bedside Disposition Plan: Home in 3 to 4 days when pain is better controlled   Consultants:   Palliative care  Oncology/radiation  Vascular surgery  Procedures:   XRT  Antimicrobials: Anti-infectives (From admission, onward)   Start     Dose/Rate Route Frequency Ordered Stop   01/16/18 1600  fluconazole (DIFLUCAN) tablet 100 mg     100 mg Oral Daily 01/16/18 1511     01/10/18 1200  cephALEXin (KEFLEX) capsule 500 mg     500 mg Oral Every 6 hours 01/10/18 1007 01/14/18 2359   01/10/18 1000  cephALEXin (KEFLEX) capsule 500 mg  Status:  Discontinued     500 mg Oral 3 times daily 01/10/18 0807 01/10/18 1007   01/06/18 1100  ceFAZolin (ANCEF) IVPB 1 g/50 mL premix  Status:  Discontinued     1 g 100 mL/hr over 30 Minutes Intravenous Every 8 hours 01/06/18 1038 01/10/18 0804   12/22/17 1300  amoxicillin-clavulanate (AUGMENTIN) 875-125 MG per tablet 1  tablet  Status:  Discontinued     1 tablet Oral Every 12 hours 12/22/17 1219 12/30/17 1520   12/20/17 1400  Ampicillin-Sulbactam (UNASYN) 3 g in sodium chloride 0.9 % 100 mL IVPB  Status:  Discontinued     3 g 200 mL/hr over 30  Minutes Intravenous Every 6 hours 12/20/17 1145 12/22/17 1219   12/20/17 0000  vancomycin (VANCOCIN) 500 mg in sodium chloride 0.9 % 100 mL IVPB  Status:  Discontinued     500 mg 100 mL/hr over 60 Minutes Intravenous Every 12 hours 12/19/17 1500 12/20/17 1145   12/18/17 0300  vancomycin (VANCOCIN) IVPB 1000 mg/200 mL premix  Status:  Discontinued     1,000 mg 200 mL/hr over 60 Minutes Intravenous Every 12 hours 12/17/17 1354 12/19/17 1500   12/17/17 2000  piperacillin-tazobactam (ZOSYN) IVPB 3.375 g  Status:  Discontinued     3.375 g 12.5 mL/hr over 240 Minutes Intravenous Every 8 hours 12/17/17 1354 12/20/17 1145   12/17/17 1430  piperacillin-tazobactam (ZOSYN) IVPB 3.375 g     3.375 g 100 mL/hr over 30 Minutes Intravenous  Once 12/17/17 1351 12/17/17 1916   12/17/17 1430  vancomycin (VANCOCIN) 1,750 mg in sodium chloride 0.9 % 500 mL IVPB     1,750 mg 250 mL/hr over 120 Minutes Intravenous  Once 12/17/17 1351 12/17/17 1747        Objective: Vitals:   01/15/18 2126 01/15/18 2312 01/16/18 0532 01/16/18 1423  BP: 129/70 (!) 149/90 (!) 142/76 126/89  Pulse: (!) 103 (!) 110 96 98  Resp: 16 18 16 16   Temp: 98.8 F (37.1 C)  98.8 F (37.1 C) 98.8 F (37.1 C)  TempSrc: Oral  Oral Oral  SpO2: 95% 100% 97% 99%  Weight:      Height:        Intake/Output Summary (Last 24 hours) at 01/16/2018 1924 Last data filed at 01/15/2018 2222 Gross per 24 hour  Intake 3 ml  Output -  Net 3 ml   Filed Weights   12/18/17 1613 01/01/18 1100  Weight: 71.6 kg (157 lb 13.6 oz) 86.1 kg (189 lb 12.8 oz)    Examination:  General exam: Appears calm and comfortable  HEENT: PERRLA, OP  White plaque on tongue,  neck swelling to the right, discoloration and induration.  Respiratory system: Clear to auscultation. No wheezes,crackle or rhonchi Cardiovascular system: S1 & S2 heard, RRR. murmurs, rubs or gallops Gastrointestinal system: Abdomen is nondistended, soft and nontender. No organomegaly or  masses Central nervous system: Alert and oriented. No focal neurological deficits. Extremities: R foot gangrene, trace LE edema b/l   Skin: See above  Psychiatry: Mood & affect appropriate.    Data Reviewed: I have personally reviewed following labs and imaging studies  CBC: Recent Labs  Lab 01/10/18 0425 01/13/18 0449 01/16/18 0729  WBC 11.8* 10.2 9.5  NEUTROABS 10.7* 9.4*  --   HGB 8.5* 7.9* 7.5*  HCT 28.0* 25.6* 23.8*  MCV 93.3 93.8 90.8  PLT 312 316 322   Basic Metabolic Panel: Recent Labs  Lab 01/10/18 0425 01/13/18 0449 01/16/18 0729  NA 143 140 140  K 4.4 4.5 4.2  CL 102 99 100  CO2 30 29 29   GLUCOSE 93 105* 123*  BUN 18 17 19   CREATININE 0.68 0.72 0.69  CALCIUM 8.4* 8.5* 8.7*  MG 2.0  --   --    GFR: Estimated Creatinine Clearance: 136 mL/min (by C-G formula based on SCr of 0.69 mg/dL). Liver Function  Tests: Recent Labs  Lab 01/10/18 0425  AST 44*  ALT 91*  ALKPHOS 144*  BILITOT 0.3  PROT 6.5  ALBUMIN 2.6*   No results for input(s): LIPASE, AMYLASE in the last 168 hours. No results for input(s): AMMONIA in the last 168 hours. Coagulation Profile: No results for input(s): INR, PROTIME in the last 168 hours. Cardiac Enzymes: No results for input(s): CKTOTAL, CKMB, CKMBINDEX, TROPONINI in the last 168 hours. BNP (last 3 results) No results for input(s): PROBNP in the last 8760 hours. HbA1C: No results for input(s): HGBA1C in the last 72 hours. CBG: No results for input(s): GLUCAP in the last 168 hours. Lipid Profile: No results for input(s): CHOL, HDL, LDLCALC, TRIG, CHOLHDL, LDLDIRECT in the last 72 hours. Thyroid Function Tests: No results for input(s): TSH, T4TOTAL, FREET4, T3FREE, THYROIDAB in the last 72 hours. Anemia Panel: No results for input(s): VITAMINB12, FOLATE, FERRITIN, TIBC, IRON, RETICCTPCT in the last 72 hours. Sepsis Labs: No results for input(s): PROCALCITON, LATICACIDVEN in the last 168 hours.  No results found for this  or any previous visit (from the past 240 hour(s)).    Radiology Studies: Ct Soft Tissue Neck W Contrast  Result Date: 01/16/2018 CLINICAL DATA:  RIGHT neck pain for 1 month after PICC line placement. New dysphagia and stridor. History of lung cancer and RIGHT neck venous thrombosis. EXAM: CT NECK WITH CONTRAST TECHNIQUE: Multidetector CT imaging of the neck was performed using the standard protocol following the bolus administration of intravenous contrast. CONTRAST:  27mL OMNIPAQUE IOHEXOL 300 MG/ML  SOLN COMPARISON:  CT neck December 17, 2017. FINDINGS: PHARYNX AND LARYNX: Retropharyngeal effusion without rim enhancement. Normal pharynx and larynx. SALIVARY GLANDS: Normal, however mass effect due to lymphadenopathy. THYROID: Normal. LYMPH NODES: Worsening bulky RIGHT lymphadenopathy. For example, RIGHT level 3-5 3.1 x 4 cm lymph node was 1.6 x 2.1 cm. RIGHT level 3 lymph node was 1.3 x 1.8 cm, now 3.1 x 3.5 cm. Multiple new RIGHT neck lymph nodes at levels 2, 3, 4 and 7. Superior mediastinal nodal conglomeration was 3 x 4.9 cm, now 3.9 x 6.2 cm with mild mass effect on the trachea. Partially imaged bulky RIGHT axillary lymphadenopathy. LEFT supraclavicular 2.6 x 3.9 cm lymph node was 1.7 x 3.1 cm. VASCULAR: Central thrombus RIGHT mid internal jugular vein is unchanged, compressed or occluded towards the brachiocephalic confluence. LIMITED INTRACRANIAL: Normal. VISUALIZED ORBITS: Normal. MASTOIDS AND VISUALIZED PARANASAL SINUSES: RIGHT maxillary mucosal retention cysts without paranasal sinus air-fluid levels. Trace paranasal sinus mucosal thickening. Included mastoid air cells are well aerated. SKELETON: Nonacute. Severe LEFT temporomandibular osteoarthrosis. Poor dentition. 1 cm sclerotic lesion C6 is similar, potentially metastatic. UPPER CHEST: Lung apices are clear. No superior mediastinal lymphadenopathy. OTHER: Anterolateral RIGHT neck trans spatial edema with loss of RIGHT paraspinal muscle fat planes.  Thickened RIGHT greater than LEFT platysma. Moderate to severe C5-6 degenerative disc. IMPRESSION: 1. Rapid disease progression: Increasing RIGHT greater than LEFT neck, axillary and mediastinal lymphadenopathy. Severe RIGHT anterolateral transspatial edema and retropharyngeal effusion resulting in tracheal displacement. Patent airway. 2. 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 3. Acute findings discussed with and reconfirmed by [facility] G[facility] on 01/16/2018 at 4:28 pm. Electronically Signed   By: Elon Alas M.D.   On: 01/16/2018 16:30      Scheduled Meds: . acetaminophen  1,000 mg Oral TID  . atorvastatin  40 mg Oral Daily  . dexamethasone  4 mg Intravenous BID  . dronabinol  2.5 mg Oral BID AC  .  DULoxetine  60 mg Oral Daily  . feeding supplement  1 Container Oral TID BM  . fluconazole  100 mg Oral Daily  . fondaparinux (ARIXTRA) injection  7.5 mg Subcutaneous q1800  . gabapentin  400 mg Oral TID  . hydrochlorothiazide  25 mg Oral Daily  . hydrocortisone   Rectal BID  . irbesartan  150 mg Oral Daily  . lidocaine  1 patch Transdermal Q24H  . LORazepam  1 mg Oral BID  . LORazepam  1.5 mg Oral QHS  . magnesium hydroxide  15 mL Oral Daily  . methadone  17.5 mg Oral Q8H  . methocarbamol  1,000 mg Oral QID  . metoprolol succinate  100 mg Oral QHS  . nutrition supplement (JUVEN)  1 packet Oral BID BM  . OLANZapine  5 mg Oral QHS  . pantoprazole  40 mg Oral Daily  . polyethylene glycol  17 g Oral Daily  . senna-docusate  2 tablet Oral BID  . sodium chloride flush  3 mL Intravenous Q12H  . sucralfate  1 g Oral TID WC & HS   Continuous Infusions: . sodium chloride Stopped (01/10/18 0318)  . sodium chloride       LOS: 30 days    Time spent: Total of 35 minutes spent with pt, greater than 50% of which was spent in discussion of  treatment, counseling and coordination of care    Chipper Oman, MD Pager: Text Page via [email or web address]   If 7PM-7AM,  please contact night-coverage [email or web address] 01/16/2018, 7:24 PM   Note - This record has been created using [employer]. Chart creation errors have been sought, but may not always have been located. Such creation errors do not reflect on the standard of medical care.

## 2018-01-16 NOTE — Progress Notes (Signed)
Palliative Care  Follow-up Note  Tony [name] and his wife report a bad night last PM. He has an acute pain episode and it was difficult to get it under control, however prn opioid medication administration totals as whole are drastically lower in the last 24 hours.  New complaints today of difficulty swallowing, saliva and secretions collecting in his throat, pain with swallowing and a "choking feeling". His neck may in fact be slightly more swollen today on my exam. His arm swelling continues to be much better. His wife expressed concerns about him having trouble breathing at night and tells me she wants another scan to see it is getting worse- she is worried about this and afraid he may be having "periods of apnea" at night.   Also noted on exam today was a thick white exudate on his tongue, buccal surfaces and significant posterior pharyngeal involvement- highly suspicious for mucocuteaneous thrush -may extend into the pharynx and esophagus causing his symptoms. Will treat with fluconazole and I have consulted pharmacy to help with recommendation and requested nursing assess him for any signs of respiratory depression- fluconazole can increase serum concentrations of methadone. He will need at least 2 weeks of oral fluconazole.  Repeating a 12 lead EKG to assess QT.  Given his change in status and symptoms of new dysphagia and question of stridor will obtain a CT Scan of his neck to assess for any critical progression or concerning mass compression.  I am not going to change his opioids today given events last PM- will take another day or two for methadone to reach steady state again.  We will continue to follow closely for palliative needs and symptom management.  Patient and his wife are agreeable and appreciative of above outlined plan.  [name], DO Palliative Medicine [phone]  Time: 35 minutes Greater than 50%  of this time was spent counseling and coordinating care related to  the above assessment and plan.

## 2018-01-16 NOTE — Progress Notes (Signed)
Pt c/o of difficulty breathing, swallowing and "pressure on chest" Pt able to breathe, and speak, wife in room. SpO2 at 100% on 2L.Tylene Fantasia paged, no orders received. Advised to monitor pt and report any changes/distress.

## 2018-01-16 NOTE — Progress Notes (Signed)
[facility] Therapy Treatment Record Phone [phone]   Radiation Therapy was administered to Tony [name] on: 01/16/2018  1:05 PM and was treatment # 13 out of a planned course of 32 treatments.  Radiation Treatment  1). Beam photons with 6-10 energy  2). Brachytherapy None  3). Stereotactic Radiosurgery None  4). Other Radiation None     Tony [name] F Tosca Pletz, RT (T)

## 2018-01-17 ENCOUNTER — Ambulatory Visit
Admit: 2018-01-17 | Discharge: 2018-01-17 | Disposition: A | Discharge status: Home / Self Care | Payer: BLUE CROSS/BLUE SHIELD | Attending: Radiation Oncology | Admitting: Radiation Oncology

## 2018-01-17 LAB — CBC
HCT: 23.7 % — ABNORMAL LOW (ref 39.0–52.0)
Hemoglobin: 7.5 g/dL — ABNORMAL LOW (ref 13.0–17.0)
MCH: 28.7 pg (ref 26.0–34.0)
MCHC: 31.6 g/dL (ref 30.0–36.0)
MCV: 90.8 fL (ref 78.0–100.0)
Platelets: 330 10*3/uL (ref 150–400)
RBC: 2.61 MIL/uL — ABNORMAL LOW (ref 4.22–5.81)
RDW: 16.3 % — AB (ref 11.5–15.5)
WBC: 10.2 10*3/uL (ref 4.0–10.5)

## 2018-01-17 LAB — PREPARE RBC (CROSSMATCH)

## 2018-01-17 LAB — ABO/RH: ABO/RH(D): O POS

## 2018-01-17 MED ORDER — HYDROMORPHONE HCL 4 MG PO TABS
4.0000 mg | ORAL_TABLET | ORAL | Status: DC | PRN
  Administered 2018-01-17 – 2018-01-19 (×7): 4 mg via ORAL
  Filled 2018-01-17 (×8): qty 1

## 2018-01-17 MED ORDER — LORAZEPAM 0.5 MG PO TABS
0.5000 mg | ORAL_TABLET | Freq: Two times a day (BID) | ORAL | Status: DC
  Administered 2018-01-18 – 2018-01-23 (×12): 0.5 mg via ORAL
  Filled 2018-01-17 (×12): qty 1

## 2018-01-17 MED ORDER — SODIUM CHLORIDE 0.9% IV SOLUTION
Freq: Once | INTRAVENOUS | Status: AC
  Administered 2018-01-17: 14:00:00 via INTRAVENOUS

## 2018-01-17 MED ORDER — HYDROMORPHONE HCL 1 MG/ML IJ SOLN
0.5000 mg | INTRAMUSCULAR | Status: DC | PRN
  Administered 2018-01-17 – 2018-01-19 (×10): 0.5 mg via INTRAVENOUS
  Filled 2018-01-17 (×10): qty 0.5

## 2018-01-17 MED ORDER — LORAZEPAM 1 MG PO TABS
1.0000 mg | ORAL_TABLET | Freq: Every day | ORAL | Status: DC
  Administered 2018-01-17 – 2018-01-22 (×6): 1 mg via ORAL
  Filled 2018-01-17 (×6): qty 1

## 2018-01-17 MED ORDER — GABAPENTIN 400 MG PO CAPS
400.0000 mg | ORAL_CAPSULE | Freq: Two times a day (BID) | ORAL | Status: DC
  Administered 2018-01-18 – 2018-01-23 (×11): 400 mg via ORAL
  Filled 2018-01-17 (×11): qty 1

## 2018-01-17 NOTE — Progress Notes (Signed)
PROGRESS NOTE [facility]   Tony [name]   AOZ:[id number] DOB: 07/06/1965  DOA: 12/17/2017 PCP: [name], MD   Brief Narrative:  Tony [name] is a 52 year old male with medical history of PAD, status post femorofemoral bypass, thrombectomy and post wound infection.  Patient was admitted on 12/17/17 due to increase drainage from incisions from his left groin and left knee.  Patient underwent our to bifemoral bypass on 6/6 due to critical left limb ischemia.  Upon ED evaluation neck swelling was noted and CT of the neck showed s[iculated nodules in RUL with cervical lymphadenopathy and metastatic nodes in the neck and upper mediastinum this was concern for lung CA.  Biopsy was done and was consistent with poorly differentiated adenocarcinoma.  Oncology was consulted and recommended radiotherapy.  During hospital stay was noted to have venous thrombosis of the right subclavian vein with SVC syndrome.  Hospital course has been complicated with difficult pain control, AKI and radiation dermatitis.  Subjective: Patient seen and examined, his pain was much better controlled last night.  Did not require any IV Dilaudid.  Assessment & Plan: Metastatic Poorly differentiated adenocarcinoma,  lung primary Cervical mediastinal and axillary lymphadenopathy and SCV syndrome Prolonged hospital stay due to pain control and complication with SVC syndrome along with lymphadenopathy.  Radiation oncology and vascular surgery were consulted. Currently receiving radiation, per oncology planning for chemotherapy at some point however this will be performed as an outpatient. Swelling of the right arm has significantly decreased.  Vascular surgery recommending to complete radiation therapy before considering endovascular approach for SVC.  Palliative care team has been consulted and helping with pain management.  DVT upper extremity Multiple place patient currently on Arixtras, per oncology Prior to  admission used to be on Xarelto will discuss with oncology if patient will continue current anticoagulation regimen or ultimately discharge on Xarelto.  Pain due to malignancy Has been difficult to manage, palliative care now involved on management of the pain. Patient is currently on methadone, he is also receiving Robaxin and Decadron.  Goal is to decrease IV Dilaudid and use oral hydromorphone for breakthrough pain along with methadone.  Patient also on Cymbalta and gabapentin.  Hopefully will reach methadone steady-state soon. Continue management per Palliative team. Outpatient follow up has been arranged  Right foot gangrene Per vascular surgery planning for outpatient management  Anxiety and bipolar disorder On Zyprexa, Ativan and Cymbalta this is heavily contributed to uncontrolled pain.  Radiation dermatitis Unlikely to be cellulitis, however patient was treated with cefazolin and Keflex.  Will start hydrocortisone 1% twice a day.  Neck swelling and ? Difficulty swallowing  neck swelling likely to be related to radiation and SVC, difficulty swallowing and increased in reflux likely to be related to oral thrush versus radiation esophagitis.  Patient already on PPI.  CT neck shows Rapid disease progression with increased right greater than left neck ancillary and mediastinal lymphadenopathy.  Severe right anterolateral thighheel edema and retropharyngeal effusion resulting in tracheal displacement, patent airway.  Continue Carafate and fluconazole.  Decadron has been increased to 4 times daily. Will discuss case with rad oncology.   Anemia of chronic disease Hemoglobin 7.5, per oncology transfuse if hemoglobin below 8 Will transfuse 1 unit of PRBCs. Monitor CBC in AM   Severe constipation - resolved Continue bowel regimen  DVT prophylaxis: Arixtra Code Status: Full code Family Communication: Family at bedside Disposition Plan: Home in 3 to 4 days when pain is better controlled    Consultants:  Palliative care  Oncology/radiation  Vascular surgery  Procedures:   XRT  Antimicrobials: Anti-infectives (From admission, onward)   Start     Dose/Rate Route Frequency Ordered Stop   01/16/18 1600  fluconazole (DIFLUCAN) tablet 100 mg     100 mg Oral Daily 01/16/18 1511     01/10/18 1200  cephALEXin (KEFLEX) capsule 500 mg     500 mg Oral Every 6 hours 01/10/18 1007 01/14/18 2359   01/10/18 1000  cephALEXin (KEFLEX) capsule 500 mg  Status:  Discontinued     500 mg Oral 3 times daily 01/10/18 0807 01/10/18 1007   01/06/18 1100  ceFAZolin (ANCEF) IVPB 1 g/50 mL premix  Status:  Discontinued     1 g 100 mL/hr over 30 Minutes Intravenous Every 8 hours 01/06/18 1038 01/10/18 0804   12/22/17 1300  amoxicillin-clavulanate (AUGMENTIN) 875-125 MG per tablet 1 tablet  Status:  Discontinued     1 tablet Oral Every 12 hours 12/22/17 1219 12/30/17 1520   12/20/17 1400  Ampicillin-Sulbactam (UNASYN) 3 g in sodium chloride 0.9 % 100 mL IVPB  Status:  Discontinued     3 g 200 mL/hr over 30 Minutes Intravenous Every 6 hours 12/20/17 1145 12/22/17 1219   12/20/17 0000  vancomycin (VANCOCIN) 500 mg in sodium chloride 0.9 % 100 mL IVPB  Status:  Discontinued     500 mg 100 mL/hr over 60 Minutes Intravenous Every 12 hours 12/19/17 1500 12/20/17 1145   12/18/17 0300  vancomycin (VANCOCIN) IVPB 1000 mg/200 mL premix  Status:  Discontinued     1,000 mg 200 mL/hr over 60 Minutes Intravenous Every 12 hours 12/17/17 1354 12/19/17 1500   12/17/17 2000  piperacillin-tazobactam (ZOSYN) IVPB 3.375 g  Status:  Discontinued     3.375 g 12.5 mL/hr over 240 Minutes Intravenous Every 8 hours 12/17/17 1354 12/20/17 1145   12/17/17 1430  piperacillin-tazobactam (ZOSYN) IVPB 3.375 g     3.375 g 100 mL/hr over 30 Minutes Intravenous  Once 12/17/17 1351 12/17/17 1916   12/17/17 1430  vancomycin (VANCOCIN) 1,750 mg in sodium chloride 0.9 % 500 mL IVPB     1,750 mg 250 mL/hr over 120 Minutes  Intravenous  Once 12/17/17 1351 12/17/17 1747       Objective: Vitals:   01/16/18 2125 01/17/18 0441 01/17/18 0442 01/17/18 1407  BP: (!) 138/93 (!) 151/90 (!) 149/93 (!) 150/88  Pulse: 96 92 90 98  Resp: 12 16  12   Temp: 98.1 F (36.7 C) 99 F (37.2 C)  98.4 F (36.9 C)  TempSrc: Oral Oral  Oral  SpO2: 98% 100%  100%  Weight:      Height:        Intake/Output Summary (Last 24 hours) at 01/17/2018 1537 Last data filed at 01/16/2018 2230 Gross per 24 hour  Intake 360 ml  Output 400 ml  Net -40 ml   Filed Weights   12/18/17 1613 01/01/18 1100  Weight: 71.6 kg 86.1 kg    Examination:  General exam: NAD HEENT: Right neck swelling, slightly improved from yesterday.  Marked discoloration. Respiratory system: CTA no wheezing or rales  Cardiovascular system: S1S2 RRR no murmurs  Gastrointestinal system: Abd Soft NTND  Central nervous system: AAOx3  Extremities: R foot gangrene, b/l LE edema  Skin: chest and neck discoloration,  Psychiatry: Normal mood    Data Reviewed: I have personally reviewed following labs and imaging studies  CBC: Recent Labs  Lab 01/13/18 0449 01/16/18 0729 01/17/18 0435  WBC  10.2 9.5 10.2  NEUTROABS 9.4*  --   --   HGB 7.9* 7.5* 7.5*  HCT 25.6* 23.8* 23.7*  MCV 93.8 90.8 90.8  PLT 316 342 811   Basic Metabolic Panel: Recent Labs  Lab 01/13/18 0449 01/16/18 0729  NA 140 140  K 4.5 4.2  CL 99 100  CO2 29 29  GLUCOSE 105* 123*  BUN 17 19  CREATININE 0.72 0.69  CALCIUM 8.5* 8.7*   GFR: Estimated Creatinine Clearance: 136 mL/min (by C-G formula based on SCr of 0.69 mg/dL). Liver Function Tests: No results for input(s): AST, ALT, ALKPHOS, BILITOT, PROT, ALBUMIN in the last 168 hours. No results for input(s): LIPASE, AMYLASE in the last 168 hours. No results for input(s): AMMONIA in the last 168 hours. Coagulation Profile: No results for input(s): INR, PROTIME in the last 168 hours. Cardiac Enzymes: No results for input(s):  CKTOTAL, CKMB, CKMBINDEX, TROPONINI in the last 168 hours. BNP (last 3 results) No results for input(s): PROBNP in the last 8760 hours. HbA1C: No results for input(s): HGBA1C in the last 72 hours. CBG: No results for input(s): GLUCAP in the last 168 hours. Lipid Profile: No results for input(s): CHOL, HDL, LDLCALC, TRIG, CHOLHDL, LDLDIRECT in the last 72 hours. Thyroid Function Tests: No results for input(s): TSH, T4TOTAL, FREET4, T3FREE, THYROIDAB in the last 72 hours. Anemia Panel: No results for input(s): VITAMINB12, FOLATE, FERRITIN, TIBC, IRON, RETICCTPCT in the last 72 hours. Sepsis Labs: No results for input(s): PROCALCITON, LATICACIDVEN in the last 168 hours.  No results found for this or any previous visit (from the past 240 hour(s)).    Radiology Studies: Ct Soft Tissue Neck W Contrast  Result Date: 01/16/2018 CLINICAL DATA:  RIGHT neck pain for 1 month after PICC line placement. New dysphagia and stridor. History of lung cancer and RIGHT neck venous thrombosis. EXAM: CT NECK WITH CONTRAST TECHNIQUE: Multidetector CT imaging of the neck was performed using the standard protocol following the bolus administration of intravenous contrast. CONTRAST:  13mL OMNIPAQUE IOHEXOL 300 MG/ML  SOLN COMPARISON:  CT neck December 17, 2017. FINDINGS: PHARYNX AND LARYNX: Retropharyngeal effusion without rim enhancement. Normal pharynx and larynx. SALIVARY GLANDS: Normal, however mass effect due to lymphadenopathy. THYROID: Normal. LYMPH NODES: Worsening bulky RIGHT lymphadenopathy. For example, RIGHT level 3-5 3.1 x 4 cm lymph node was 1.6 x 2.1 cm. RIGHT level 3 lymph node was 1.3 x 1.8 cm, now 3.1 x 3.5 cm. Multiple new RIGHT neck lymph nodes at levels 2, 3, 4 and 7. Superior mediastinal nodal conglomeration was 3 x 4.9 cm, now 3.9 x 6.2 cm with mild mass effect on the trachea. Partially imaged bulky RIGHT axillary lymphadenopathy. LEFT supraclavicular 2.6 x 3.9 cm lymph node was 1.7 x 3.1 cm. VASCULAR:  Central thrombus RIGHT mid internal jugular vein is unchanged, compressed or occluded towards the brachiocephalic confluence. LIMITED INTRACRANIAL: Normal. VISUALIZED ORBITS: Normal. MASTOIDS AND VISUALIZED PARANASAL SINUSES: RIGHT maxillary mucosal retention cysts without paranasal sinus air-fluid levels. Trace paranasal sinus mucosal thickening. Included mastoid air cells are well aerated. SKELETON: Nonacute. Severe LEFT temporomandibular osteoarthrosis. Poor dentition. 1 cm sclerotic lesion C6 is similar, potentially metastatic. UPPER CHEST: Lung apices are clear. No superior mediastinal lymphadenopathy. OTHER: Anterolateral RIGHT neck trans spatial edema with loss of RIGHT paraspinal muscle fat planes. Thickened RIGHT greater than LEFT platysma. Moderate to severe C5-6 degenerative disc. IMPRESSION: 1. Rapid disease progression: Increasing RIGHT greater than LEFT neck, axillary and mediastinal lymphadenopathy. Severe RIGHT anterolateral transspatial edema and retropharyngeal  effusion resulting in tracheal displacement. Patent airway. 2. 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 3. Acute findings discussed with and reconfirmed by [facility] on 01/16/2018 at 4:28 pm. Electronically Signed   By: Elon Alas M.D.   On: 01/16/2018 16:30      Scheduled Meds: . acetaminophen  1,000 mg Oral TID  . atorvastatin  40 mg Oral Daily  . dexamethasone  4 mg Intravenous Q6H  . dronabinol  2.5 mg Oral BID AC  . DULoxetine  60 mg Oral Daily  . feeding supplement  1 Container Oral TID BM  . fluconazole  100 mg Oral Daily  . fondaparinux (ARIXTRA) injection  7.5 mg Subcutaneous q1800  . gabapentin  400 mg Oral TID  . hydrochlorothiazide  25 mg Oral Daily  . hydrocortisone   Rectal BID  . hydrocortisone cream   Topical BID  . irbesartan  150 mg Oral Daily  . lidocaine  1 patch Transdermal Q24H  . LORazepam  1 mg Oral BID  . LORazepam  1.5 mg Oral QHS  . magnesium hydroxide   15 mL Oral Daily  . methadone  17.5 mg Oral Q8H  . methocarbamol  1,000 mg Oral QID  . metoprolol succinate  100 mg Oral QHS  . nutrition supplement (JUVEN)  1 packet Oral BID BM  . OLANZapine  5 mg Oral QHS  . pantoprazole  40 mg Oral Daily  . polyethylene glycol  17 g Oral Daily  . senna-docusate  2 tablet Oral BID  . sodium chloride flush  3 mL Intravenous Q12H  . sucralfate  1 g Oral TID WC & HS   Continuous Infusions: . sodium chloride       LOS: 31 days    Time spent: Total of 35 minutes spent with pt, greater than 50% of which was spent in discussion of  treatment, counseling and coordination of care    Chipper Oman, MD Pager: Text Page via [email or web address]   If 7PM-7AM, please contact night-coverage [email or web address] 01/17/2018, 3:37 PM   Note - This record has been created using [employer]. Chart creation errors have been sought, but may not always have been located. Such creation errors do not reflect on the standard of medical care.

## 2018-01-17 NOTE — Progress Notes (Signed)
We were asked by Dr. Quincy Simmonds to review the patient's recent CT scans due to concerns with progressive disease in the neck. Recent imaging has been reviewed personally by Dr. Tammi Klippel and while there is progression as compared with the previous imaging from July 8th, 2019, the lymphadenopathy is felt to be stable as compared to just prior to starting treatment (seen on radiation planning scans).  Dr. Tammi Klippel recommends that we continue with palliative radiotherapy as planned to completion and no further intervention necessary at this time unless the patient is progressing symptomatically.  He will need chemotherapy in the near future which will help with overall systemic disease control.   Nicholos Johns, PA-C    Tyler Pita, MD  [facility] Direct Dial: [phone]  Fax: [phone] [email or web address]  Skype  LinkedIn

## 2018-01-17 NOTE — Progress Notes (Signed)
[facility] Therapy Treatment Record Phone [phone]   Radiation Therapy was administered to Tony [name] on: 01/17/2018  1:30 PM and was treatment # 14 out of a planned course of 32 treatments.  Radiation Treatment  1). Beam photons with 6-10 energy  2). Brachytherapy None  3). Stereotactic Radiosurgery None  4). Other Radiation None     Tony [name] J [name], RT

## 2018-01-17 NOTE — Progress Notes (Addendum)
Palliative Care  Progress Note  Tony [name] is much more somnolent today. Difficult to awaken without physical stimulation and effort. Just had PRN dose of hydromorphone.RN expressed concerns about memory loss and confusion intermittently today. Tony [name] was not up for conversation but I spoke with his wife, son and daughter outside of the room. His wife had many questions and concerns for me this afternoon:  1. His wife asked me about the CT scan results- she wanted to see the scan and was upset to find out there was rapid disease progression -she was under the impression it only showed some swelling- I spoke with radiologist yesterday at length who was very concerned about progression -she noted some could be related to radiation but favored lymphadenopathy to be cancer related. His wife reports some difficulty breathing at night if he is lying on his back, he continues to have increasing difficulty with swallowing and throat pain.   Radiation appears to be helping with pain management and symptom burden, but his planned medical oncology appointment was for last week and he was still here in the hospital. Given progression findings on CT and adenopathy and complicated hospital stay will request Dr. Irene Limbo see him inpatient- unsure if systemic chemotherapy is still an option in this gentleman who has ischemic wounds on his lower extremities, rapid disease progression, difficult to control pain and baseline anemia.  Wife wants to meet with Dr. Quincy Simmonds tells me she wants the facts and to be told the truth about prognosis and treatment options-  I told her today that his cancer was not curable and that with or without treatment he will likely die from the cancer and not have a very long length of life. She confirmed QOL most important and is processing this information and beginning to get things in order.  2. His wife asked about the blood transfusion. I explained this was recommended by hospitalist because his Hb was  7.5 for past 2 days and his weakness and lethargy could be symptomatic. She was concerned he "might be bleeding somewhere".  3. Significant reduction today in PRN pain medication usage but I am concerned about his somnolence. Will cut back amount of PRN doses of both IV and PO dilaudid. Maintain current methadone dose.  4. Continue treatment of thrush for at least 14 days.  Will continue to follow closely. His wife has HCPOA paperwork.  Assisted with FMLA paperwork for the key family members caring for him. Family very supportive.  Time: 35 min Greater than 50%  of this time was spent counseling and coordinating care related to the above assessment and plan.  [name], DO Palliative Medicine [phone]

## 2018-01-18 ENCOUNTER — Other Ambulatory Visit: Payer: Self-pay

## 2018-01-18 ENCOUNTER — Ambulatory Visit
Admit: 2018-01-18 | Discharge: 2018-01-18 | Disposition: A | Discharge status: Home / Self Care | Payer: BLUE CROSS/BLUE SHIELD | Attending: Radiation Oncology | Admitting: Radiation Oncology

## 2018-01-18 LAB — BPAM RBC
Blood Product Expiration Date: 201909072359
ISSUE DATE / TIME: 201908081951
Unit Type and Rh: 5100

## 2018-01-18 LAB — BASIC METABOLIC PANEL
ANION GAP: 10 (ref 5–15)
BUN: 21 mg/dL — ABNORMAL HIGH (ref 6–20)
CHLORIDE: 102 mmol/L (ref 98–111)
CO2: 29 mmol/L (ref 22–32)
Calcium: 9.1 mg/dL (ref 8.9–10.3)
Creatinine, Ser: 0.75 mg/dL (ref 0.61–1.24)
GFR calc Af Amer: >60 mL/min (ref >60)
GLUCOSE: 130 mg/dL — AB (ref 70–99)
POTASSIUM: 4.7 mmol/L (ref 3.5–5.1)
SODIUM: 141 mmol/L (ref 135–145)

## 2018-01-18 LAB — TYPE AND SCREEN
ABO/RH(D): O POS
ANTIBODY SCREEN: NEGATIVE
UNIT DIVISION: 0

## 2018-01-18 LAB — CBC
HEMATOCRIT: 29.1 % — AB (ref 39.0–52.0)
HEMOGLOBIN: 9.1 g/dL — AB (ref 13.0–17.0)
MCH: 28.5 pg (ref 26.0–34.0)
MCHC: 31.3 g/dL (ref 30.0–36.0)
MCV: 91.2 fL (ref 78.0–100.0)
Platelets: 379 10*3/uL (ref 150–400)
RBC: 3.19 MIL/uL — ABNORMAL LOW (ref 4.22–5.81)
RDW: 15.7 % — ABNORMAL HIGH (ref 11.5–15.5)
WBC: 10.8 10*3/uL — AB (ref 4.0–10.5)

## 2018-01-18 MED ORDER — METHADONE HCL 5 MG PO TABS
2.5000 mg | ORAL_TABLET | Freq: Once | ORAL | Status: AC
  Administered 2018-01-18: 2.5 mg via ORAL
  Filled 2018-01-18: qty 1

## 2018-01-18 NOTE — Progress Notes (Signed)
Spoke with pt's wife concerning discharge needs. She is not sure at this present time and will get back with me. Will continue to follow.

## 2018-01-18 NOTE — Progress Notes (Signed)
[facility] Therapy Treatment Record Phone [phone]   Radiation Therapy was administered to Tony [name] on: 01/18/2018  1:25 PM and was treatment # 15 out of a planned course of 32 treatments.  Radiation Treatment  1). Beam photons with 6-10 energy  2). Brachytherapy None  3). Stereotactic Radiosurgery None  4). Other Radiation None     Aras Albarran J Noboru Bidinger, RT

## 2018-01-18 NOTE — Progress Notes (Addendum)
PROGRESS NOTE [facility]   ASSER LUCENA   JKD:[id number] DOB: 1966/05/14  DOA: 12/17/2017 PCP: [name], MD   Brief Narrative:  Tony [name] is a 52 year old male with medical history of PAD, status post femorofemoral bypass, thrombectomy and post wound infection.  Patient was admitted on 12/17/17 due to increase drainage from incisions from his left groin and left knee.  Patient underwent our to bifemoral bypass on 6/6 due to critical left limb ischemia.  Upon ED evaluation neck swelling was noted and CT of the neck showed s[iculated nodules in RUL with cervical lymphadenopathy and metastatic nodes in the neck and upper mediastinum this was concern for lung CA.  Biopsy was done and was consistent with poorly differentiated adenocarcinoma.  Oncology was consulted and recommended radiotherapy.  During hospital stay was noted to have venous thrombosis of the right subclavian vein with SVC syndrome.  Hospital course has been complicated with difficult pain control, AKI and radiation dermatitis.  Subjective: Patient seen and examined, just got out of radiation therapy, doing well this AM. Case discussed with wife, she reports that patient does not recall our discussion regarding rapid progression of the diseases.   Assessment & Plan: Metastatic Poorly differentiated adenocarcinoma,  lung primary Cervical mediastinal and axillary lymphadenopathy and SCV syndrome Prolonged hospital stay due to pain control and complication with SVC syndrome along with lymphadenopathy.  Radiation oncology and vascular surgery were consulted. Currently receiving radiation, per oncology planning for chemotherapy at some point however this will be performed as an outpatient. Swelling of the right arm has significantly decreased.  Vascular surgery recommending to complete radiation therapy before considering endovascular approach for SVC.  Palliative care team has been consulted and helping with pain  management. CT of neck showed progression of disease, I discussed with rad onc whom did not recommend additional changes. Med oncology has been re-consulted to weight in next step.   DVT upper extremity Multiple places patient currently on Arixtras, per oncology. Will continue current regimen   Pain due to malignancy Has been difficult to manage, palliative care now involved on management of the pain. Patient is currently on methadone, he is also receiving Robaxin and Decadron.  Goal is to decrease IV Dilaudid and use oral hydromorphone for breakthrough pain along with methadone.  Patient also on Cymbalta and gabapentin. Seem that Methadone is helping significantly, will need to start decadron taper soon. Continue pain management per palliative team.    Right foot gangrene/Wound infection  Per vascular surgery planning for outpatient management Infectious process has resolved   Anxiety and bipolar disorder On Zyprexa, Ativan and Cymbalta this is heavily contributed to uncontrolled pain.  Radiation dermatitis Unlikely to be cellulitis, however patient completed treatement with cefazolin and Keflex. Continue  hydrocortisone 1% twice a day. Rad onc to weight in regarding skin toxicity.   Neck swelling related to rapid progression of lymphadenopathy  Difficulty with swallow has resolved after starting fluconazole CT neck shows Rapid disease progression with increased right greater than left neck ancillary and mediastinal lymphadenopathy.  Severe right anterolateral thighheel edema and retropharyngeal effusion resulting in tracheal displacement, patent airway. Rad onc with does not recommend additional management, deferred to med onc for possible chemo. Will await oncology recommendations   Oral thrush  Continue Fluconazole   Anemia of chronic disease Hemoglobin 7.5, per oncology transfuse if hemoglobin below 8 Will transfuse 1 unit of PRBCs. Monitor CBC in AM   Severe constipation -  resolved Continue bowel regimen  DVT  prophylaxis: Arixtra Code Status: Full code Family Communication: Family at bedside Disposition Plan: Home in 2-3 days pending palliative and oncology clearance   Consultants:   Palliative care  Oncology/radiation  Vascular surgery  Procedures:   XRT  Antimicrobials: Anti-infectives (From admission, onward)   Start     Dose/Rate Route Frequency Ordered Stop   01/16/18 1600  fluconazole (DIFLUCAN) tablet 100 mg     100 mg Oral Daily 01/16/18 1511     01/10/18 1200  cephALEXin (KEFLEX) capsule 500 mg     500 mg Oral Every 6 hours 01/10/18 1007 01/14/18 2359   01/10/18 1000  cephALEXin (KEFLEX) capsule 500 mg  Status:  Discontinued     500 mg Oral 3 times daily 01/10/18 0807 01/10/18 1007   01/06/18 1100  ceFAZolin (ANCEF) IVPB 1 g/50 mL premix  Status:  Discontinued     1 g 100 mL/hr over 30 Minutes Intravenous Every 8 hours 01/06/18 1038 01/10/18 0804   12/22/17 1300  amoxicillin-clavulanate (AUGMENTIN) 875-125 MG per tablet 1 tablet  Status:  Discontinued     1 tablet Oral Every 12 hours 12/22/17 1219 12/30/17 1520   12/20/17 1400  Ampicillin-Sulbactam (UNASYN) 3 g in sodium chloride 0.9 % 100 mL IVPB  Status:  Discontinued     3 g 200 mL/hr over 30 Minutes Intravenous Every 6 hours 12/20/17 1145 12/22/17 1219   12/20/17 0000  vancomycin (VANCOCIN) 500 mg in sodium chloride 0.9 % 100 mL IVPB  Status:  Discontinued     500 mg 100 mL/hr over 60 Minutes Intravenous Every 12 hours 12/19/17 1500 12/20/17 1145   12/18/17 0300  vancomycin (VANCOCIN) IVPB 1000 mg/200 mL premix  Status:  Discontinued     1,000 mg 200 mL/hr over 60 Minutes Intravenous Every 12 hours 12/17/17 1354 12/19/17 1500   12/17/17 2000  piperacillin-tazobactam (ZOSYN) IVPB 3.375 g  Status:  Discontinued     3.375 g 12.5 mL/hr over 240 Minutes Intravenous Every 8 hours 12/17/17 1354 12/20/17 1145   12/17/17 1430  piperacillin-tazobactam (ZOSYN) IVPB 3.375 g     3.375  g 100 mL/hr over 30 Minutes Intravenous  Once 12/17/17 1351 12/17/17 1916   12/17/17 1430  vancomycin (VANCOCIN) 1,750 mg in sodium chloride 0.9 % 500 mL IVPB     1,750 mg 250 mL/hr over 120 Minutes Intravenous  Once 12/17/17 1351 12/17/17 1747       Objective: Vitals:   01/17/18 2015 01/17/18 2253 01/18/18 0430 01/18/18 1426  BP: (!) 141/84 (!) 148/95 (!) 147/86 (!) 156/81  Pulse: 84 94 78 82  Resp: 16 16 18 16   Temp: 98.5 F (36.9 C) 98.6 F (37 C) 97.8 F (36.6 C) 97.8 F (36.6 C)  TempSrc: Oral Oral Oral Oral  SpO2: 95% 98% 98% 98%  Weight:      Height:        Intake/Output Summary (Last 24 hours) at 01/18/2018 1619 Last data filed at 01/18/2018 0514 Gross per 24 hour  Intake 878.67 ml  Output 850 ml  Net 28.67 ml   Filed Weights   12/18/17 1613 01/01/18 1100  Weight: 71.6 kg 86.1 kg    Examination: No changes in exam from yesterday   General exam: NAD HEENT: Right neck swelling, slightly improved. Marked discoloration. Respiratory system: CTA no wheezing or rales  Cardiovascular system: S1S2 RRR no murmurs  Gastrointestinal system: Abd Soft NTND  Central nervous system: AAOx3  Extremities: R foot gangrene, b/l LE edema  Skin: chest and  neck discoloration, erythema  Psychiatry: Normal mood    Data Reviewed: I have personally reviewed following labs and imaging studies  CBC: Recent Labs  Lab 01/13/18 0449 01/16/18 0729 01/17/18 0435 01/18/18 0800  WBC 10.2 9.5 10.2 10.8*  NEUTROABS 9.4*  --   --   --   HGB 7.9* 7.5* 7.5* 9.1*  HCT 25.6* 23.8* 23.7* 29.1*  MCV 93.8 90.8 90.8 91.2  PLT 316 342 330 425   Basic Metabolic Panel: Recent Labs  Lab 01/13/18 0449 01/16/18 0729 01/18/18 0800  NA 140 140 141  K 4.5 4.2 4.7  CL 99 100 102  CO2 29 29 29   GLUCOSE 105* 123* 130*  BUN 17 19 21*  CREATININE 0.72 0.69 0.75  CALCIUM 8.5* 8.7* 9.1   GFR: Estimated Creatinine Clearance: 136 mL/min (by C-G formula based on SCr of 0.75 mg/dL). Liver  Function Tests: No results for input(s): AST, ALT, ALKPHOS, BILITOT, PROT, ALBUMIN in the last 168 hours. No results for input(s): LIPASE, AMYLASE in the last 168 hours. No results for input(s): AMMONIA in the last 168 hours. Coagulation Profile: No results for input(s): INR, PROTIME in the last 168 hours. Cardiac Enzymes: No results for input(s): CKTOTAL, CKMB, CKMBINDEX, TROPONINI in the last 168 hours. BNP (last 3 results) No results for input(s): PROBNP in the last 8760 hours. HbA1C: No results for input(s): HGBA1C in the last 72 hours. CBG: No results for input(s): GLUCAP in the last 168 hours. Lipid Profile: No results for input(s): CHOL, HDL, LDLCALC, TRIG, CHOLHDL, LDLDIRECT in the last 72 hours. Thyroid Function Tests: No results for input(s): TSH, T4TOTAL, FREET4, T3FREE, THYROIDAB in the last 72 hours. Anemia Panel: No results for input(s): VITAMINB12, FOLATE, FERRITIN, TIBC, IRON, RETICCTPCT in the last 72 hours. Sepsis Labs: No results for input(s): PROCALCITON, LATICACIDVEN in the last 168 hours.  No results found for this or any previous visit (from the past 240 hour(s)).    Radiology Studies: No results found.    Scheduled Meds: . acetaminophen  1,000 mg Oral TID  . atorvastatin  40 mg Oral Daily  . dexamethasone  4 mg Intravenous Q6H  . dronabinol  2.5 mg Oral BID AC  . DULoxetine  60 mg Oral Daily  . feeding supplement  1 Container Oral TID BM  . fluconazole  100 mg Oral Daily  . fondaparinux (ARIXTRA) injection  7.5 mg Subcutaneous q1800  . gabapentin  400 mg Oral BID  . hydrochlorothiazide  25 mg Oral Daily  . hydrocortisone   Rectal BID  . hydrocortisone cream   Topical BID  . irbesartan  150 mg Oral Daily  . lidocaine  1 patch Transdermal Q24H  . LORazepam  0.5 mg Oral BID  . LORazepam  1 mg Oral QHS  . magnesium hydroxide  15 mL Oral Daily  . methadone  17.5 mg Oral Q8H  . methadone  2.5 mg Oral Once  . methocarbamol  1,000 mg Oral QID  .  metoprolol succinate  100 mg Oral QHS  . nutrition supplement (JUVEN)  1 packet Oral BID BM  . OLANZapine  5 mg Oral QHS  . pantoprazole  40 mg Oral Daily  . polyethylene glycol  17 g Oral Daily  . senna-docusate  2 tablet Oral BID  . sodium chloride flush  3 mL Intravenous Q12H  . sucralfate  1 g Oral TID WC & HS   Continuous Infusions: . sodium chloride       LOS: 32 days  Time spent: Total of 25 minutes spent with pt, greater than 50% of which was spent in discussion of  treatment, counseling and coordination of care   Chipper Oman, MD Pager: Text Page via [email or web address]   If 7PM-7AM, please contact night-coverage [email or web address] 01/18/2018, 4:19 PM   Note - This record has been created using [employer]. Chart creation errors have been sought, but may not always have been located. Such creation errors do not reflect on the standard of medical care.

## 2018-01-18 NOTE — Progress Notes (Signed)
[facility]   HEMATOLOGY/ONCOLOGY INPATIENT PROGRESS NOTE  Date of Service: 01/18/2018  Inpatient Attending: .Patrecia Pour, Christean Grief, MD   SUBJECTIVE  Tony [name] was seen in followup for recently diagnosed metastatic poorly differentiated adenocarcinoma likely of lung primary. He is accompanied today by his wife and brother. The pt reports that he is doing well overall.   The pt reports that he feels that his right neck swelling is alleviating a little bit and his his pain is much better controlled with Methadone. Hisrt upper extremity swelling has resolved as well. He notes that his chest pain has been well controlled with a cream that he uses twice a day after radiation. He notes that he has now been able to turn his neck without pain after receiving radiation. He adds that he can feel the swelling in his neck moving backwards and down, spreading out and decreasing.   He notes that his right foot continues to be problematic with swelling.   The pt notes that the early mornings continue to be difficult from a pain management stand point, but overall he is improved and continues to improve.   Lab results today (01/18/18) of CBC is as follows: all values are WNL except for WBC at 10.8k, RBC at 3.19, HGB at 9.1, HCT at 29.1, RDW at 15.7.  On review of systems, pt reports decreased neck swelling, controlled neck and chest pain, increased neck mobility, improved energy levels, and denies fevers, chills, abdominal pains, headaches, changes in vision, and any other symptoms.   OBJECTIVE:  NAD PHYSICAL EXAMINATION: . Vitals:   01/17/18 2253 01/18/18 0430 01/18/18 1426 01/18/18 2141  BP: (!) 148/95 (!) 147/86 (!) 156/81 (!) 150/83  Pulse: 94 78 82 82  Resp: 16 18 16 20   Temp: 98.6 F (37 C) 97.8 F (36.6 C) 97.8 F (36.6 C) 98.1 F (36.7 C)  TempSrc: Oral Oral Oral Oral  SpO2: 98% 98% 98% 98%  Weight:      Height:       Filed Weights   12/18/17 1613 01/01/18 1100  Weight: 157 lb 13.6 oz  (71.6 kg) 189 lb 12.8 oz (86.1 kg)   .Body mass index is 22.51 kg/m.  GENERAL:alert, in no acute distress and comfortable SKIN: improved erythema over neck, supraclavicular and chest wall with decreased skin edema EYES: conjunctiva are pink and non-injected, sclera anicteric OROPHARYNX: MMM, no exudates, no oropharyngeal erythema or ulceration NECK: b/l cervical LNadenopathy R>>L improved, improved erythema over the neck, supraclavicular area and chest wall with decreased skin edema LYMPH:  Palpable b/l cervical  lymphadenopathy LUNGS: clear to auscultation b/l with normal respiratory effort HEART: regular rate & rhythm ABDOMEN:  normoactive bowel sounds , non tender, not distended. No palpable hepatosplenomegaly.  Extremity: Much reduced swelling in RUE, minimal LUE swelling. 2+ right pedal edema. 1+ left pedal edema. Rt foot gangrenous changes. Rt groin wound healed with no discharge at this time PSYCH: alert & oriented x 3 with fluent speech NEURO: no focal motor/sensory deficits   MEDICAL HISTORY:  Past Medical History:  Diagnosis Date  . Anxiety   . Arterial occlusive disease    multilevel arterial occlusive disease/notes 11/13/2017  . Arthritis    "left knee" (11/13/2017)  . Chronic back pain   . Chronic lower back pain   . Depression   . Gastric ulcer due to Helicobacter pylori 1443  . GERD (gastroesophageal reflux disease)   . High cholesterol   . Hypertension   . IBS (irritable bowel syndrome)   .  Mesenteric adenitis 2011   presumed/H&P  . Migraines    "use to suffer migraines years ago" (11/13/2017)  . Peripheral vascular disease ([place])   . Pneumonia 2011  . Ulcer     SURGICAL HISTORY: Past Surgical History:  Procedure Laterality Date  . AORTA - BILATERAL FEMORAL ARTERY BYPASS GRAFT Bilateral 11/15/2017   Procedure: AORTA BIFEMORAL BYPASS GRAFT;  Surgeon: Angelia Mould, MD;  Location: [place];  Service: Vascular;  Laterality: Bilateral;  . BACK SURGERY  X 4    "procedure where they burn the nerves every 6 months"  . EMBOLECTOMY Left 12/01/2017   Procedure: THROMBECTOMY OF LEFT AORTAFEMORAL BYPASS, THROMBECTOMY OF TIBIAL ARTERY;  Surgeon: Rosetta Posner, MD;  [facility];  Service: Vascular;  Laterality: Left;  . ESOPHAGOGASTRODUODENOSCOPY  08/22/2011   Procedure: ESOPHAGOGASTRODUODENOSCOPY (EGD);  Surgeon: [name], MD;  Location: [place];  Service: Gastroenterology;  Laterality: N/A;  . FEMORAL-POPLITEAL BYPASS GRAFT Right 11/15/2017   Procedure: Right FEMORAL-Above knee POPLITEAL ARTERY Bypass Graft using nonreversed saphenous vein ;  Surgeon: Angelia Mould, MD;  Location: [place];  Service: Vascular;  Laterality: Right;  . FEMORAL-POPLITEAL BYPASS GRAFT Left 12/01/2017   Procedure: BYPASS GRAFT FEMORAL- ABOVE KNEE POPLITEAL ARTERY WITH VEIN;  Surgeon: Rosetta Posner, MD;  Location: [place];  Service: Vascular;  Laterality: Left;  . INTRAOPERATIVE ARTERIOGRAM Right 11/15/2017   Procedure: INTRA OPERATIVE ARTERIOGRAM;  Surgeon: Angelia Mould, MD;  Location: [place];  Service: Vascular;  Laterality: Right;  . INTRAOPERATIVE ARTERIOGRAM Left 12/01/2017   Procedure: INTRA OPERATIVE ARTERIOGRAM OF LEFT LEG;  Surgeon: Rosetta Posner, MD;  Location: [place];  Service: Vascular;  Laterality: Left;  . IR US GUIDE BX ASP/DRAIN  12/18/2017  . KNEE ARTHROSCOPY Left X 5    SOCIAL HISTORY: Social History   Socioeconomic History  . Marital status: Married    Spouse name: Not on file  . Number of children: 3  . Years of education: Not on file  . Highest education level: Not on file  Occupational History  . Occupation: Admin. Assistant    Employer: [employer]  Social Needs  . Financial resource strain: Not on file  . Food insecurity:    Worry: Not on file    Inability: Not on file  . Transportation needs:    Medical: Not on file    Non-medical: Not on file  Tobacco Use  . Smoking status: Former Smoker    Packs/day: 1.00    Years:  24.00    Pack years: 24.00    Types: Cigarettes    Start date: 11/28/2017    Last attempt to quit: 12/04/2017    Years since quitting: 0.1  . Smokeless tobacco: Never Used  Substance and Sexual Activity  . Alcohol use: Yes    Comment: 11/13/2017 "did drink wine q  1-2 months; nothing anymore"   . Drug use: Not Currently    Types: Marijuana    Comment: 11/13/2017  "last drug use was in ~ 1987 "  . Sexual activity: Not on file  Lifestyle  . Physical activity:    Days per week: Not on file    Minutes per session: Not on file  . Stress: Not on file  Relationships  . Social connections:    Talks on phone: Not on file    Gets together: Not on file    Attends religious service: Not on file    Active member of club or organization: Not  on file    Attends meetings of clubs or organizations: Not on file    Relationship status: Not on file  . Intimate partner violence:    Fear of current or ex partner: Not on file    Emotionally abused: Not on file    Physically abused: Not on file    Forced sexual activity: Not on file  Other Topics Concern  . Not on file  Social History Narrative   Lives with wife and two children.  Administrator at [employer]: Family History  Problem Relation Age of Onset  . Hypertension Mother   . Hypertension Father   . Coronary artery disease Father 75  . Heart attack Brother 67  . Coronary artery disease Brother   . Diabetes Neg Hx   . Stroke Neg Hx     ALLERGIES:  is allergic to pork-derived products; shellfish-derived products; and morphine and related.  MEDICATIONS:  Scheduled Meds: . acetaminophen  1,000 mg Oral TID  . atorvastatin  40 mg Oral Daily  . dexamethasone  4 mg Intravenous Q6H  . dronabinol  2.5 mg Oral BID AC  . DULoxetine  60 mg Oral Daily  . feeding supplement  1 Container Oral TID BM  . fluconazole  100 mg Oral Daily  . fondaparinux (ARIXTRA) injection  7.5 mg Subcutaneous q1800  . gabapentin  400 mg Oral BID  .  hydrochlorothiazide  25 mg Oral Daily  . hydrocortisone   Rectal BID  . hydrocortisone cream   Topical BID  . irbesartan  150 mg Oral Daily  . lidocaine  1 patch Transdermal Q24H  . LORazepam  0.5 mg Oral BID  . LORazepam  1 mg Oral QHS  . magnesium hydroxide  15 mL Oral Daily  . methadone  17.5 mg Oral Q8H  . methocarbamol  1,000 mg Oral QID  . metoprolol succinate  100 mg Oral QHS  . nutrition supplement (JUVEN)  1 packet Oral BID BM  . OLANZapine  5 mg Oral QHS  . pantoprazole  40 mg Oral Daily  . polyethylene glycol  17 g Oral Daily  . senna-docusate  2 tablet Oral BID  . sodium chloride flush  3 mL Intravenous Q12H  . sucralfate  1 g Oral TID WC & HS   Continuous Infusions: . sodium chloride     PRN Meds:.sodium chloride, alum & mag hydroxide-simeth, bisacodyl, hydrALAZINE, HYDROmorphone (DILAUDID) injection, HYDROmorphone, labetalol, lactulose, magic mouthwash, naLOXone (NARCAN)  injection, ondansetron, phenol, povidone-iodine, sodium chloride flush  REVIEW OF SYSTEMS:    A 10+ POINT REVIEW OF SYSTEMS WAS OBTAINED including neurology, dermatology, psychiatry, cardiac, respiratory, lymph, extremities, GI, GU, Musculoskeletal, constitutional, breasts, reproductive, HEENT.  All pertinent positives are noted in the HPI.  All others are negative.    LABORATORY DATA:  I have reviewed the data as listed  . CBC Latest Ref Rng & Units 01/18/2018 01/17/2018 01/16/2018  WBC 4.0 - 10.5 K/uL 10.8(H) 10.2 9.5  Hemoglobin 13.0 - 17.0 g/dL 9.1(L) 7.5(L) 7.5(L)  Hematocrit 39.0 - 52.0 % 29.1(L) 23.7(L) 23.8(L)  Platelets 150 - 400 K/uL 379 330 342   . CBC    Component Value Date/Time   WBC 10.8 (H) 01/18/2018 0800   RBC 3.19 (L) 01/18/2018 0800   HGB 9.1 (L) 01/18/2018 0800   HCT 29.1 (L) 01/18/2018 0800   PLT 379 01/18/2018 0800   MCV 91.2 01/18/2018 0800   MCH 28.5 01/18/2018 0800   MCHC 31.3 01/18/2018 0800  RDW 15.7 (H) 01/18/2018 0800   LYMPHSABS 0.4 (L) 01/13/2018 0449    MONOABS 0.4 01/13/2018 0449   EOSABS 0.0 01/13/2018 0449   BASOSABS 0.0 01/13/2018 0449    CMP Latest Ref Rng & Units 01/18/2018 01/16/2018 01/13/2018  Glucose 70 - 99 mg/dL 130(H) 123(H) 105(H)  BUN 6 - 20 mg/dL 21(H) 19 17  Creatinine 0.61 - 1.24 mg/dL 0.75 0.69 0.72  Sodium 135 - 145 mmol/L 141 140 140  Potassium 3.5 - 5.1 mmol/L 4.7 4.2 4.5  Chloride 98 - 111 mmol/L 102 100 99  CO2 22 - 32 mmol/L 29 29 29   Calcium 8.9 - 10.3 mg/dL 9.1 8.7(L) 8.5(L)  Total Protein 6.5 - 8.1 g/dL - - -  Total Bilirubin 0.3 - 1.2 mg/dL - - -  Alkaline Phos 38 - 126 U/L - - -  AST 15 - 41 U/L - - -  ALT 0 - 44 U/L - - -    RADIOGRAPHIC STUDIES: I have personally reviewed the radiological images as listed and agreed with the findings in the report. Ct Soft Tissue Neck W Contrast  Result Date: 01/16/2018 CLINICAL DATA:  RIGHT neck pain for 1 month after PICC line placement. New dysphagia and stridor. History of lung cancer and RIGHT neck venous thrombosis. EXAM: CT NECK WITH CONTRAST TECHNIQUE: Multidetector CT imaging of the neck was performed using the standard protocol following the bolus administration of intravenous contrast. CONTRAST:  2mL OMNIPAQUE IOHEXOL 300 MG/ML  SOLN COMPARISON:  CT neck December 17, 2017. FINDINGS: PHARYNX AND LARYNX: Retropharyngeal effusion without rim enhancement. Normal pharynx and larynx. SALIVARY GLANDS: Normal, however mass effect due to lymphadenopathy. THYROID: Normal. LYMPH NODES: Worsening bulky RIGHT lymphadenopathy. For example, RIGHT level 3-5 3.1 x 4 cm lymph node was 1.6 x 2.1 cm. RIGHT level 3 lymph node was 1.3 x 1.8 cm, now 3.1 x 3.5 cm. Multiple new RIGHT neck lymph nodes at levels 2, 3, 4 and 7. Superior mediastinal nodal conglomeration was 3 x 4.9 cm, now 3.9 x 6.2 cm with mild mass effect on the trachea. Partially imaged bulky RIGHT axillary lymphadenopathy. LEFT supraclavicular 2.6 x 3.9 cm lymph node was 1.7 x 3.1 cm. VASCULAR: Central thrombus RIGHT mid internal  jugular vein is unchanged, compressed or occluded towards the brachiocephalic confluence. LIMITED INTRACRANIAL: Normal. VISUALIZED ORBITS: Normal. MASTOIDS AND VISUALIZED PARANASAL SINUSES: RIGHT maxillary mucosal retention cysts without paranasal sinus air-fluid levels. Trace paranasal sinus mucosal thickening. Included mastoid air cells are well aerated. SKELETON: Nonacute. Severe LEFT temporomandibular osteoarthrosis. Poor dentition. 1 cm sclerotic lesion C6 is similar, potentially metastatic. UPPER CHEST: Lung apices are clear. No superior mediastinal lymphadenopathy. OTHER: Anterolateral RIGHT neck trans spatial edema with loss of RIGHT paraspinal muscle fat planes. Thickened RIGHT greater than LEFT platysma. Moderate to severe C5-6 degenerative disc. IMPRESSION: 1. Rapid disease progression: Increasing RIGHT greater than LEFT neck, axillary and mediastinal lymphadenopathy. Severe RIGHT anterolateral transspatial edema and retropharyngeal effusion resulting in tracheal displacement. Patent airway. 2. 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 3. Acute findings discussed with and reconfirmed by [facility] on 01/16/2018 at 4:28 pm. Electronically Signed   By: Elon Alas M.D.   On: 01/16/2018 16:30   Ct Chest W Contrast  Result Date: 01/07/2018 CLINICAL DATA:  Newly diagnosed stage IV cancer. Swelling in right chest. Swelling in neck. Question abscess of chest. Chest cellulitis. EXAM: CT CHEST WITH CONTRAST TECHNIQUE: Multidetector CT imaging of the chest was performed during intravenous contrast administration. CONTRAST:  8mL  OMNIPAQUE IOHEXOL 300 MG/ML  SOLN COMPARISON:  CT of the chest December 24, 2017 FINDINGS: Cardiovascular: The thoracic aorta is nonaneurysmal with no dissection. No coronary artery calcifications. The heart is normal. The main pulmonary artery is normal in caliber. The right hilar adenopathy exerts mass effect on the upper lobe pulmonary artery on the  right without obvious occlusion on limited visualization. No definitive pulmonary emboli. Mediastinum/Nodes: Bulky adenopathy is seen in the base of neck bilaterally. A representative node in the left base of neck on series 2, image 9 measures 2.2 cm today versus 2.0 cm previously. Adenopathy in the right base of neck persists. The representative node to the right of the trachea on series 2, image 22 measures 1.9 cm on both studies. The adenopathy in the right neck may be bulkier but is difficult to compare due to difference in slice selection. There is deviation of the larynx to the left which is slightly more pronounced without airway narrowing. A right paratracheal node in the upper mediastinum measures 4 cm today versus 3.6 cm previously. Adenopathy involves the subcarinal region, the precarinal region, the prevascular space, and the right hilum. No effusions. The esophagus is normal. The central SVC remains patent. There is poor opacification of the right subclavian vein, right brachiocephalic vein, and right internal jugular vein, consistent with known DVT seen on previous imaging. Significant edema in the chest wall diffusely. Skin thickening. No abscess identified in the chest wall. Bulky adenopathy again noted in the right axilla more prominent the interval. A representative node in the right axilla on series 2, image 66 measures 1.5 cm today versus 1.2 cm previously. Bulky adenopathy in the right retropectoral region appears more prominent as well. No axillary adenopathy on the left. No adenopathy in the left retropectoral region. Lungs/Pleura: The spiculated nodule in the right upper lobe measures 1.4 by 2.4 by 1.3 cm today, unchanged in the interval. Minimal ground-glass in the left apex may represent minimal infectious or inflammatory change. No other nodules or masses. No other changes. Mild paraseptal emphysematous changes in the apices. Central airways are normal. No pneumothorax. Upper Abdomen: No  acute abnormality. Musculoskeletal: No chest wall abnormality. No acute or significant osseous findings. IMPRESSION: 1. The known spiculated mass in the right upper lobe is stable in size. Bulky adenopathy in the cervical lymph node chains, right greater than left; the mediastinum; the right axilla; the right retropectoral region; and the right hilum is overall worsened in the interval. 2. Poor enhancement in the right internal jugular, brachiocephalic, and subclavian veins consistent with previously reported DVT in his locations. 3. Severe edema in the chest wall with no abscess identified. 4. Mass effect on the right upper lobe pulmonary artery due to right hilar adenopathy without complete occlusion. No obvious DVT on limited images. Emphysema (ICD10-J43.9). Electronically Signed   By: Dorise Bullion III M.D   On: 01/07/2018 13:52   Ct Chest W Contrast  Result Date: 12/25/2017 CLINICAL DATA:  Spiculated lung mass and adenopathy shown on neck CT, for further workup. EXAM: CT CHEST, ABDOMEN, AND PELVIS WITH CONTRAST TECHNIQUE: Multidetector CT imaging of the chest, abdomen and pelvis was performed following the standard protocol during bolus administration of intravenous contrast. CONTRAST:  169mL OMNIPAQUE IOHEXOL 300 MG/ML  SOLN COMPARISON:  12/17/2017 and CT abdomen from 08/14/2011 there is some mild fatty infiltration in segment IV adjacent to the falciform ligament. Otherwise unremarkable. FINDINGS: CT CHEST FINDINGS Cardiovascular: Aortic arch and branch vessel atherosclerotic vascular disease. Poor visualization of  contrast in the right internal jugular vein, compatible with patient's known venous thrombosis. Mediastinum/Nodes: Bilateral lower neck level IV adenopathy with ill definition. A left level Roman numeral 4 lymph node measures 2.0 cm in short axis on image 2/3. There is also bilateral supraclavicular adenopathy including a 1.9 cm right-sided supraclavicular node on image 7/3. Bulky  paratracheal, prevascular, subcarinal, and right hilar adenopathy noted. An index right paratracheal node measures 3.6 cm in short axis on image 16/3. Enlarged right subpectoral lymph nodes are present along with scattered right axillary lymph nodes. Mild stranding in the anterior mediastinum. Lungs/Pleura: Moderate right and small left pleural effusion are present and are nonspecific for transudative versus exudative etiology. A spiculated right upper lobe nodule measures 2.4 by 1.3 by 1.4 cm, roughly stable from 12/17/2017. There is passive atelectasis in both lower lobes, right greater than left. Paraseptal emphysema noted. Musculoskeletal: Notable subcutaneous edema throughout the chest, somewhat confluent along the right upper chest. CT ABDOMEN PELVIS FINDINGS Hepatobiliary: Unremarkable Pancreas: Unremarkable Spleen: Unremarkable Adrenals/Urinary Tract: Unremarkable Stomach/Bowel: Air-fluid levels in the rectum indicating diarrheal process. Borderline wall thickening several segments of proximal small bowel. Vascular/Lymphatic: Aortoiliac atherosclerotic vascular disease. Aortobifemoral graft small fluid collection superficial to the right groin likely representing residual hematoma from prior groin puncture, but only measuring about 2.4 by 2.1 by 4.3 cm (volume = 11 cm^3). No gas in this collection. Left inguinal lymph node 0.9 cm in short axis. Reproductive: Unremarkable Other: Diffuse subcutaneous edema. Small amount of pelvic ascites. Gas tracking along the anterior abdominal wall subcutaneous tissues, correlate with any anterior abdominal wall injections. Subcutaneous edema extends along the pubis and towards the penis. Musculoskeletal: Unremarkable IMPRESSION: 1. Bulky adenopathy in the neck and upper chest. Persistent 2.4 cm spiculated right upper lobe nodule. The degree of adenopathy seems disproportionate to the size of the nodule, and this could represent adenopathy related to lung cancer or  potentially 2 separate processes such as lymphoproliferative disease and a separate primary lung cancer. An infectious cause for the pulmonary nodule is less likely. 2. Extensive third spacing of fluid in the chest, abdomen and pelvis. Query hypoproteinemia or hypoalbuminemia. 3. Poor visualization of contrast in the right internal jugular vein compatible with the patient's known venous thrombosis. 4. Air fluid levels in the rectum indicating diarrheal process. Borderline wall thickening in the proximal small bowel, query proximal enteritis. 5. The small fluid collection superficial to the right groin puncture site is only about 11 cc in volume, and probably reflects a resolving hematoma. 6. Gas tracking along anterior abdominal wall subcutaneous tissues, probably from injections-correlate with any anterior abdominal wall subcutaneous injections recently. Electronically Signed   By: Van Clines M.D.   On: 12/25/2017 00:05   Mr Jeri Cos JJ Contrast  Result Date: 12/25/2017 CLINICAL DATA:  52 year old male with right upper extremity and right central venous DVT with bulky cervical and thoracic lymphadenopathy and spiculated right apical lung nodule. EXAM: MRI HEAD WITHOUT AND WITH CONTRAST TECHNIQUE: Multiplanar, multiecho pulse sequences of the brain and surrounding structures were obtained without and with intravenous contrast. CONTRAST:  7mL MULTIHANCE GADOBENATE DIMEGLUMINE 529 MG/ML IV SOLN COMPARISON:  Neck CT 12/17/2017. Head CT without contrast 08/03/2006. Neck and chest CT FINDINGS: Brain: No midline shift, mass effect, or evidence of intracranial mass lesion. No abnormal enhancement identified. No dural thickening. No restricted diffusion to suggest acute infarction. No ventriculomegaly, extra-axial collection or acute intracranial hemorrhage. Cervicomedullary junction and pituitary are within normal limits. Pearline Cables and white matter signal is within  normal limits for age throughout the brain; Minimal  nonspecific left frontal lobe white matter T2 and FLAIR hyperintensity (series 6, image 19). No cortical encephalomalacia or chronic cerebral blood products. Vascular: Major intracranial vascular flow voids are preserved. The major dural venous sinuses are enhancing and appear patent. The right IJ bulb at the skull base is patent. Skull and upper cervical spine: Negative visible cervical spine and spinal cord. Visualized bone marrow signal is within normal limits. Sinuses/Orbits: Negative orbits soft tissues. Paranasal sinuses and mastoids are stable and well pneumatized. Other: Visible internal auditory structures appear normal. Partially visible bulky right neck lymphadenopathy (series 4, image 14). IMPRESSION: 1. No cerebral metastatic disease or acute intracranial abnormality identified. 2. Right cervical lymphadenopathy redemonstrated. Electronically Signed   By: Genevie Ann M.D.   On: 12/25/2017 09:17   Ct Abdomen Pelvis W Contrast  Result Date: 12/25/2017 CLINICAL DATA:  Spiculated lung mass and adenopathy shown on neck CT, for further workup. EXAM: CT CHEST, ABDOMEN, AND PELVIS WITH CONTRAST TECHNIQUE: Multidetector CT imaging of the chest, abdomen and pelvis was performed following the standard protocol during bolus administration of intravenous contrast. CONTRAST:  135mL OMNIPAQUE IOHEXOL 300 MG/ML  SOLN COMPARISON:  12/17/2017 and CT abdomen from 08/14/2011 there is some mild fatty infiltration in segment IV adjacent to the falciform ligament. Otherwise unremarkable. FINDINGS: CT CHEST FINDINGS Cardiovascular: Aortic arch and branch vessel atherosclerotic vascular disease. Poor visualization of contrast in the right internal jugular vein, compatible with patient's known venous thrombosis. Mediastinum/Nodes: Bilateral lower neck level IV adenopathy with ill definition. A left level Roman numeral 4 lymph node measures 2.0 cm in short axis on image 2/3. There is also bilateral supraclavicular adenopathy  including a 1.9 cm right-sided supraclavicular node on image 7/3. Bulky paratracheal, prevascular, subcarinal, and right hilar adenopathy noted. An index right paratracheal node measures 3.6 cm in short axis on image 16/3. Enlarged right subpectoral lymph nodes are present along with scattered right axillary lymph nodes. Mild stranding in the anterior mediastinum. Lungs/Pleura: Moderate right and small left pleural effusion are present and are nonspecific for transudative versus exudative etiology. A spiculated right upper lobe nodule measures 2.4 by 1.3 by 1.4 cm, roughly stable from 12/17/2017. There is passive atelectasis in both lower lobes, right greater than left. Paraseptal emphysema noted. Musculoskeletal: Notable subcutaneous edema throughout the chest, somewhat confluent along the right upper chest. CT ABDOMEN PELVIS FINDINGS Hepatobiliary: Unremarkable Pancreas: Unremarkable Spleen: Unremarkable Adrenals/Urinary Tract: Unremarkable Stomach/Bowel: Air-fluid levels in the rectum indicating diarrheal process. Borderline wall thickening several segments of proximal small bowel. Vascular/Lymphatic: Aortoiliac atherosclerotic vascular disease. Aortobifemoral graft small fluid collection superficial to the right groin likely representing residual hematoma from prior groin puncture, but only measuring about 2.4 by 2.1 by 4.3 cm (volume = 11 cm^3). No gas in this collection. Left inguinal lymph node 0.9 cm in short axis. Reproductive: Unremarkable Other: Diffuse subcutaneous edema. Small amount of pelvic ascites. Gas tracking along the anterior abdominal wall subcutaneous tissues, correlate with any anterior abdominal wall injections. Subcutaneous edema extends along the pubis and towards the penis. Musculoskeletal: Unremarkable IMPRESSION: 1. Bulky adenopathy in the neck and upper chest. Persistent 2.4 cm spiculated right upper lobe nodule. The degree of adenopathy seems disproportionate to the size of the  nodule, and this could represent adenopathy related to lung cancer or potentially 2 separate processes such as lymphoproliferative disease and a separate primary lung cancer. An infectious cause for the pulmonary nodule is less likely. 2. Extensive third spacing of  fluid in the chest, abdomen and pelvis. Query hypoproteinemia or hypoalbuminemia. 3. Poor visualization of contrast in the right internal jugular vein compatible with the patient's known venous thrombosis. 4. Air fluid levels in the rectum indicating diarrheal process. Borderline wall thickening in the proximal small bowel, query proximal enteritis. 5. The small fluid collection superficial to the right groin puncture site is only about 11 cc in volume, and probably reflects a resolving hematoma. 6. Gas tracking along anterior abdominal wall subcutaneous tissues, probably from injections-correlate with any anterior abdominal wall subcutaneous injections recently. Electronically Signed   By: Van Clines M.D.   On: 12/25/2017 00:05   Dg Duanne Limerick W/small Bowel  Result Date: 12/28/2017 CLINICAL DATA:  Metastatic adenocarcinoma of unknown primary. EXAM: UPPER GI SERIES WITH SMALL BOWEL FOLLOW-THROUGH FLUOROSCOPY TIME:  Fluoroscopy Time:  3 minutes 6 seconds TECHNIQUE: Combined double contrast and single contrast upper GI series using effervescent crystals, thick barium, and thin barium. Subsequently, serial images of the small bowel were obtained including spot views of the terminal ileum. COMPARISON:  CT scan dated 12/24/2017 FINDINGS: The esophagus appears normal. The fundus, body, and antrum of the stomach are normal. Pylorus is normal. There is slight edema of the mucosa of the duodenal bulb but the bulb expands and contracts without a mass. The jejunum and ileum appear normal including the terminal ileum. Small bowel transit time was 50 minutes, normal. IMPRESSION: Slight edema of the mucosa of the duodenal bulb. Otherwise normal upper GI and  small-bowel follow-through. No visible mass lesions in the stomach or small bowel. Electronically Signed   By: Lorriane Shire M.D.   On: 12/28/2017 09:47    ASSESSMENT & PLAN:    52 yo male with   1. Right upper lobe nodule and extensive mediastinal and cervical lymphadenopathy due to Metastatic lung adenocarcinoma -Biopsy consistent with poorly differentiated adenocarcinoma likely lung ( no imaging evidence of primary tumor other than lung cancer) [facility] results reviewed - targetable lung cancer mutation.  CT abd no overt disease. MRI brain neg for metastatic disease UGI series- no evidence of UGI malignancy  2. Metastatic cancer related hypercoagulable status   3. Extensive Deep venous thrombosis involving the right subclavian vein, right brachiocephalic vein, and right internal jugular vein - due to cancer + vascular surgery + surgical site infection. -improved RUE swelling.  4. Rt proximal upper venous compression by bulky mediastinal tumor with impending SVC syndrome.  4. PAD with recent vascular bypass and rt foot gangrene. Rt grion infection -resolved s/p antibiotics.  5. Cancer related pain --- mx by palliative care  6. Neck and rt upper chest wall erythema and induration -- likely primarily due to radiation related skin toxicity. Being treated empirically for cellulitis with cefazolin. BL cx x 2 neg to date.  PLAN:  -palliative care following for ongoing pain mx - pain better controlled on a methadone based regimen. -Discussed pt labwork today, 01/18/18; WBC at 10.8k, HGB at 9.1, PLT normal at 379k -Interval CT from 01/16/18 discussed with pt and his family. This CT was reviewed by Rad onc with RT planning imaging and no significant worsening noted. -clinically the patient notes improved neck pain and swelling, significant improvement in RUE swelling, improved ROM of neck and no new breathing issues or chest pain. -Discussed the intentions, possible benefits, and  possible side effects of initiating palliative chemotherapy with the pt and his family and he will continue to consider this -I will discuss with the hospitalist and IR regarding  the patient's plan of discharge and the timing of initiating chemotherapy possible next week.   5. Left groin would infection post-op - resolved  6. Anemia due to metastatic cancer, blood loss from recent surgery. Plan -transfuse prn for hgb <8 to tolerate tx and for wound healing  Appreciate excellent cares from hospital medicine, palliative care and radiation oncology teams.  The total time spent in the appt was 35 minutes and more than 50% was on counseling and direct patient cares.     Sullivan Lone MD MS AAHIVMS [facility] Hematology/Oncology Physician [facility]  (Office):       [phone] (Work cell):  [phone] (Fax):           [phone]  01/18/2018 1:27 PM   I, Baldwin [place], am acting as a scribe for Dr. Irene Limbo  .I have reviewed the above documentation for accuracy and completeness, and I agree with the above. Sullivan Lone MD MS

## 2018-01-19 MED ORDER — HYDROMORPHONE HCL 4 MG PO TABS
4.0000 mg | ORAL_TABLET | ORAL | Status: DC | PRN
  Administered 2018-01-19: 4 mg via ORAL
  Administered 2018-01-19 – 2018-01-20 (×2): 8 mg via ORAL
  Administered 2018-01-20 (×2): 4 mg via ORAL
  Administered 2018-01-20: 8 mg via ORAL
  Filled 2018-01-19: qty 1
  Filled 2018-01-19 (×2): qty 2
  Filled 2018-01-19 (×2): qty 1
  Filled 2018-01-19: qty 2
  Filled 2018-01-19: qty 1

## 2018-01-19 MED ORDER — METHADONE HCL 10 MG PO TABS
20.0000 mg | ORAL_TABLET | Freq: Three times a day (TID) | ORAL | Status: DC
  Administered 2018-01-19 – 2018-01-23 (×13): 20 mg via ORAL
  Filled 2018-01-19 (×13): qty 2

## 2018-01-19 MED ORDER — HYDROMORPHONE HCL 1 MG/ML IJ SOLN
0.5000 mg | INTRAMUSCULAR | Status: DC | PRN
  Administered 2018-01-19 – 2018-01-23 (×21): 0.5 mg via INTRAVENOUS
  Filled 2018-01-19 (×22): qty 0.5

## 2018-01-19 NOTE — Progress Notes (Signed)
Daily Progress Note   Patient Name: Tony [name]       Date: 01/19/2018 DOB: [birth year]  Age: 52 y.o. MRN#: [id number] Attending Physician: Patrecia Pour, Christean Grief, MD Primary Care Physician: [name], MD Admit Date: 12/17/2017  Reason for Consultation/Follow-up: Establishing goals of care  Subjective: Patient had worsening pain overnight. Had trouble sleeping due to pain. Rates pain as severe this morning.   Length of Stay: 33  Current Medications: Scheduled Meds:  . acetaminophen  1,000 mg Oral TID  . atorvastatin  40 mg Oral Daily  . dexamethasone  4 mg Intravenous Q6H  . dronabinol  2.5 mg Oral BID AC  . DULoxetine  60 mg Oral Daily  . feeding supplement  1 Container Oral TID BM  . fluconazole  100 mg Oral Daily  . fondaparinux (ARIXTRA) injection  7.5 mg Subcutaneous q1800  . gabapentin  400 mg Oral BID  . hydrochlorothiazide  25 mg Oral Daily  . hydrocortisone   Rectal BID  . hydrocortisone cream   Topical BID  . irbesartan  150 mg Oral Daily  . lidocaine  1 patch Transdermal Q24H  . LORazepam  0.5 mg Oral BID  . LORazepam  1 mg Oral QHS  . magnesium hydroxide  15 mL Oral Daily  . methadone  20 mg Oral Q8H  . methocarbamol  1,000 mg Oral QID  . metoprolol succinate  100 mg Oral QHS  . nutrition supplement (JUVEN)  1 packet Oral BID BM  . OLANZapine  5 mg Oral QHS  . pantoprazole  40 mg Oral Daily  . polyethylene glycol  17 g Oral Daily  . senna-docusate  2 tablet Oral BID  . sodium chloride flush  3 mL Intravenous Q12H  . sucralfate  1 g Oral TID WC & HS    Continuous Infusions: . sodium chloride      PRN Meds: sodium chloride, alum & mag hydroxide-simeth, bisacodyl, hydrALAZINE, HYDROmorphone (DILAUDID) injection, HYDROmorphone, labetalol, lactulose, magic  mouthwash, naLOXone (NARCAN)  injection, ondansetron, phenol, povidone-iodine, sodium chloride flush  Physical Exam  Constitutional: He is oriented to person, place, and time.  NAD  Neck:  Large R. Neck mass  Cardiovascular: Normal rate and regular rhythm.  Pulmonary/Chest: Effort normal and breath sounds normal.  Abdominal: Soft. Bowel sounds are normal.  Musculoskeletal: He  exhibits edema.  Neurological: He is alert and oriented to person, place, and time.  Skin: Skin is warm and dry.            Vital Signs: BP (!) 144/90 (BP Location: Left Arm)   Pulse 80   Temp 98 F (36.7 C) (Oral)   Resp 12   Ht 6\' 5"  (1.956 m)   Wt 86.1 kg   SpO2 98%   BMI 22.51 kg/m  SpO2: SpO2: 98 % O2 Device: O2 Device: Room Air O2 Flow Rate: O2 Flow Rate (L/min): 2 L/min  Intake/output summary:   Intake/Output Summary (Last 24 hours) at 01/19/2018 1043 Last data filed at 01/18/2018 1500 Gross per 24 hour  Intake 240 ml  Output -  Net 240 ml   LBM: Last BM Date: 01/15/18 Baseline Weight: Weight: 71.6 kg Most recent weight: Weight: 86.1 kg       Palliative Assessment/Data:    Flowsheet Rows     Most Recent Value  Intake Tab  Referral Department  Surgery  Palliative Care Primary Diagnosis  Cancer  Date Notified  12/25/17  Palliative Care Type  New Palliative care  Reason for referral  Clarify Goals of Care, Non-pain Symptom  Date of Admission  12/17/17  Date first seen by Palliative Care  12/26/17  # of days Palliative referral response time  1 Day(s)  # of days IP prior to Palliative referral  8  Clinical Assessment  Psychosocial & Spiritual Assessment  Palliative Care Outcomes      Patient Active Problem List   Diagnosis Date Noted  . Thrush of mouth and esophagus ([place]) 01/16/2018  . Counseling regarding advance care planning and goals of care 01/01/2018  . Adenocarcinoma ([place])   . SVC syndrome 12/31/2017  . Adenocarcinoma of left lung, stage 4 ([place]) 12/31/2017  . Mass in  neck   . Neck pain due to malignant neoplastic disease ([place])   . Palliative care by specialist   . Uncontrolled pain   . Palliative care encounter   . Malignant neoplasm of upper lobe of right lung ([place]) 12/25/2017  . Gangrene of right foot ([place]) 12/20/2017  . AKI (acute kidney injury) ([place]) 12/20/2017  . PAD (peripheral artery disease) ([place]) 12/19/2017  . S/P aortobifemoral bypass surgery 12/17/2017  . Lung mass 12/17/2017  . Postoperative wound infection 12/17/2017  . Weight loss 12/17/2017  . Cervical lymphadenopathy 12/17/2017  . DVT (deep venous thrombosis) ([place]) 12/17/2017  . Aortoiliac occlusive disease ([place]) 11/15/2017  . Pain of right lower extremity due to ischemia 11/13/2017  . Hyperlipidemia LDL goal <70 11/13/2017  . PVD (peripheral vascular disease) ([place]) 11/11/2017  . H. pylori infection 10/11/2011  . Duodenal ulcer 08/23/2011  . IBS (irritable bowel syndrome) 08/18/2011  . Depression with anxiety 05/11/2009  . EPIGASTRIC PAIN, CHRONIC 05/11/2009  . SMOKER 09/01/2008  . INSOMNIA, CHRONIC 09/01/2008  . Essential hypertension 09/01/2008  . PALPITATIONS, CHRONIC 09/01/2008  . GASTRITIS 10/24/2007    Palliative Care Assessment & Plan   Patient Profile: Mr. Solivan is a 52 year old man with multiple medical problems including PAD s/p bifemoral bypass on 6/6 due to critical L. Limb ischemia, thrombectomy and postop wound infection, who was admitted on 12/17/17 due to increased drainage from incisions from his L. Groin and knee. He was found to have neck swelling with CT revealing spiculated nodules in the RUL, cervical lymphadenopathy, and metastatic nodes in the neck and upper mediastinum. Biopsy was + for poorly differentiated adenocarcinoma. Patient  has been started on XRT. His hospitalization has been complicated by SVC syndrome and severe pain.   Assessment: Pain crisis overnight. In past 24 hours he required 3.5mg  IV hydromorphone and 20mg  PO hydromorphone. Total  oral morphine equivalence of 150mg  for BTP meds. This is day #4 of last methadone increase to 17.5mg  TID.   Patient characterizes the pain in neck and chest as severe, sharp, burning, stabbing, tightness, ache, and throbbing. He says the characteristics of the pain have not changed but that the intensity started worsening last afternoon into the evening. He feels that both the oral and IV hydromorphone has helped manage the pain but that he is not getting enough of it in a timely manner. We talked about the need to transition him to an oral regimen in anticipation of possible discharge next week. Will plan to increase methadone in hope of decreasing need of prn hydromorphone. However, will also increase oral hydromorphone if needed for BTP and decrease frequency. Will also decrease frequency of IV hydromorphone with plan of stopping it entirely in the next day or so.   Case and plan discussed with supervising MD, Dr. Hilma Favors.   I also spoke with patient's wife via phone.   Recommendations/Plan:  Increase methadone to 20mg  TID  Increase hydromorphone tablets to 4-8mg  Q3H PRN  Will decrease frequency of IV hydromorphone to Q2H PRN from [facility] PRN  Will need frequent assessment and stabilization of pain prior to discharge. Plan is for community palliative care to help with methadone prescribing at home.    Thank you for allowing the Palliative Medicine Team to assist in the care of this patient.   Time In: 1000 Time Out: 1100 Total Time 60 minutes Prolonged Time Billed  NO      Greater than 50%  of this time was spent counseling and coordinating care related to the above assessment and plan.  Irean Hong, NP  Please contact Palliative Medicine Team phone at [phone] for questions and concerns.

## 2018-01-19 NOTE — Progress Notes (Signed)
PROGRESS NOTE [facility]   Tony [name]   JJK:[id number] DOB: 1965-11-01  DOA: 12/17/2017 PCP: [name], MD   Brief Narrative:  Tony [name] is a 52 year old male with medical history of PAD, status post femorofemoral bypass, thrombectomy and post wound infection.  Patient was admitted on 12/17/17 due to increase drainage from incisions from his left groin and left knee.  Patient underwent our to bifemoral bypass on 6/6 due to critical left limb ischemia.  Upon ED evaluation neck swelling was noted and CT of the neck showed s[iculated nodules in RUL with cervical lymphadenopathy and metastatic nodes in the neck and upper mediastinum this was concern for lung CA.  Biopsy was done and was consistent with poorly differentiated adenocarcinoma.  Oncology was consulted and recommended radiotherapy.  During hospital stay was noted to have venous thrombosis of the right subclavian vein with SVC syndrome.  Hospital course has been complicated with difficult pain control, AKI and radiation dermatitis.  Subjective: Patient seen and examined, he had a rough night with pain. Did not sleep well bc of pain. Denies difficulty swallowing.   Assessment & Plan: Metastatic Poorly differentiated adenocarcinoma,  lung primary Cervical mediastinal and axillary lymphadenopathy and SCV syndrome Prolonged hospital stay due to pain control and complication with SVC syndrome along with lymphadenopathy.  Radiation oncology and vascular surgery were consulted. Currently receiving radiation, per med oncology planning for chemotherapy at some as outpatient. Swelling of the right arm has significantly decreased.  Vascular surgery recommending to complete radiation therapy before considering endovascular approach for SVC. CT of neck showed progression of disease, I discussed with rad onc whom did not recommend additional changes. Med oncology now recommending to start palliative chemo next week.    DVT upper  extremity Multiple places patient currently on Arixtras, per oncology. Will continue current regimen   Pain due to malignancy Continues to be a challenge. Palliative care currently taking care of pain management.  Patient is currently on methadone wish ws increase today, he is also receiving Robaxin and Decadron.  Goal is to decrease IV Dilaudid and use oral hydromorphone for breakthrough pain along with methadone. Pain score goal is 3-4 where he feels comfortable.  Patient also on Cymbalta and gabapentin. Will switch IV decadron to PO and may start taper in AM.  Right foot gangrene/Wound infection  Per vascular surgery planning for outpatient management Infectious process has resolved   Anxiety and bipolar disorder - Stable  On Zyprexa, Ativan and Cymbalta this is heavily contributed to uncontrolled pain.  Radiation dermatitis - stable  Unlikely to be cellulitis, however patient completed treatement with cefazolin and Keflex. Continue  hydrocortisone 1% twice a day. Rad onc to weight in regarding skin toxicity.   Neck swelling related to rapid progression of lymphadenopathy  Difficulty with swallow has resolved after starting fluconazole CT neck shows Rapid disease progression with increased right greater than left neck ancillary and mediastinal lymphadenopathy.  Severe right anterolateral thighheel edema and retropharyngeal effusion resulting in tracheal displacement, patent airway. Planning for palliative chemo next week   Oral thrush  Continue Fluconazole to complete 14 days   Anemia of chronic disease Hgb stable, no signs of bleeding  Transfused 1 unit of PRBC's Transfuse if hgb < 8 per oncology   Severe constipation - resolved Continue bowel regimen  DVT prophylaxis: Arixtra Code Status: Full code Family Communication: Family at bedside Disposition Plan: Hopefully home on Monday if we achieve pain control   Consultants:   Palliative care  Oncology/radiation  Vascular  surgery  Procedures:   XRT  Antimicrobials: Anti-infectives (From admission, onward)   Start     Dose/Rate Route Frequency Ordered Stop   01/16/18 1600  fluconazole (DIFLUCAN) tablet 100 mg     100 mg Oral Daily 01/16/18 1511     01/10/18 1200  cephALEXin (KEFLEX) capsule 500 mg     500 mg Oral Every 6 hours 01/10/18 1007 01/14/18 2359   01/10/18 1000  cephALEXin (KEFLEX) capsule 500 mg  Status:  Discontinued     500 mg Oral 3 times daily 01/10/18 0807 01/10/18 1007   01/06/18 1100  ceFAZolin (ANCEF) IVPB 1 g/50 mL premix  Status:  Discontinued     1 g 100 mL/hr over 30 Minutes Intravenous Every 8 hours 01/06/18 1038 01/10/18 0804   12/22/17 1300  amoxicillin-clavulanate (AUGMENTIN) 875-125 MG per tablet 1 tablet  Status:  Discontinued     1 tablet Oral Every 12 hours 12/22/17 1219 12/30/17 1520   12/20/17 1400  Ampicillin-Sulbactam (UNASYN) 3 g in sodium chloride 0.9 % 100 mL IVPB  Status:  Discontinued     3 g 200 mL/hr over 30 Minutes Intravenous Every 6 hours 12/20/17 1145 12/22/17 1219   12/20/17 0000  vancomycin (VANCOCIN) 500 mg in sodium chloride 0.9 % 100 mL IVPB  Status:  Discontinued     500 mg 100 mL/hr over 60 Minutes Intravenous Every 12 hours 12/19/17 1500 12/20/17 1145   12/18/17 0300  vancomycin (VANCOCIN) IVPB 1000 mg/200 mL premix  Status:  Discontinued     1,000 mg 200 mL/hr over 60 Minutes Intravenous Every 12 hours 12/17/17 1354 12/19/17 1500   12/17/17 2000  piperacillin-tazobactam (ZOSYN) IVPB 3.375 g  Status:  Discontinued     3.375 g 12.5 mL/hr over 240 Minutes Intravenous Every 8 hours 12/17/17 1354 12/20/17 1145   12/17/17 1430  piperacillin-tazobactam (ZOSYN) IVPB 3.375 g     3.375 g 100 mL/hr over 30 Minutes Intravenous  Once 12/17/17 1351 12/17/17 1916   12/17/17 1430  vancomycin (VANCOCIN) 1,750 mg in sodium chloride 0.9 % 500 mL IVPB     1,750 mg 250 mL/hr over 120 Minutes Intravenous  Once 12/17/17 1351 12/17/17 1747        Objective: Vitals:   01/18/18 1426 01/18/18 2141 01/19/18 0450 01/19/18 1308  BP: (!) 156/81 (!) 150/83 (!) 144/90 (!) 158/99  Pulse: 82 82 80 72  Resp: 16 20 12 18   Temp: 97.8 F (36.6 C) 98.1 F (36.7 C) 98 F (36.7 C) 98.2 F (36.8 C)  TempSrc: Oral Oral Oral Oral  SpO2: 98% 98% 98% 99%  Weight:      Height:       No intake or output data in the 24 hours ending 01/19/18 1547 Filed Weights   12/18/17 1613 01/01/18 1100  Weight: 71.6 kg 86.1 kg    Examination:   General: Pt is alert, awake, not in acute distress Neck: Erythema, with swelling decreased. Marked discoloration in the right side  Cardiovascular: RRR, S1/S2 +, no rubs, no gallops Respiratory: CTA bilaterally, no wheezing, no rhonchi Abdominal: Soft Extremities: B/l compression ace in place  Skin: Erythema and discoloration on chest and neck   Data Reviewed: I have personally reviewed following labs and imaging studies  CBC: Recent Labs  Lab 01/13/18 0449 01/16/18 0729 01/17/18 0435 01/18/18 0800  WBC 10.2 9.5 10.2 10.8*  NEUTROABS 9.4*  --   --   --   HGB 7.9* 7.5* 7.5* 9.1*  HCT 25.6* 23.8* 23.7* 29.1*  MCV 93.8 90.8 90.8 91.2  PLT 316 342 330 992   Basic Metabolic Panel: Recent Labs  Lab 01/13/18 0449 01/16/18 0729 01/18/18 0800  NA 140 140 141  K 4.5 4.2 4.7  CL 99 100 102  CO2 29 29 29   GLUCOSE 105* 123* 130*  BUN 17 19 21*  CREATININE 0.72 0.69 0.75  CALCIUM 8.5* 8.7* 9.1   GFR: Estimated Creatinine Clearance: 136 mL/min (by C-G formula based on SCr of 0.75 mg/dL). Liver Function Tests: No results for input(s): AST, ALT, ALKPHOS, BILITOT, PROT, ALBUMIN in the last 168 hours. No results for input(s): LIPASE, AMYLASE in the last 168 hours. No results for input(s): AMMONIA in the last 168 hours. Coagulation Profile: No results for input(s): INR, PROTIME in the last 168 hours. Cardiac Enzymes: No results for input(s): CKTOTAL, CKMB, CKMBINDEX, TROPONINI in the last 168  hours. BNP (last 3 results) No results for input(s): PROBNP in the last 8760 hours. HbA1C: No results for input(s): HGBA1C in the last 72 hours. CBG: No results for input(s): GLUCAP in the last 168 hours. Lipid Profile: No results for input(s): CHOL, HDL, LDLCALC, TRIG, CHOLHDL, LDLDIRECT in the last 72 hours. Thyroid Function Tests: No results for input(s): TSH, T4TOTAL, FREET4, T3FREE, THYROIDAB in the last 72 hours. Anemia Panel: No results for input(s): VITAMINB12, FOLATE, FERRITIN, TIBC, IRON, RETICCTPCT in the last 72 hours. Sepsis Labs: No results for input(s): PROCALCITON, LATICACIDVEN in the last 168 hours.  No results found for this or any previous visit (from the past 240 hour(s)).    Radiology Studies: No results found.    Scheduled Meds: . acetaminophen  1,000 mg Oral TID  . atorvastatin  40 mg Oral Daily  . dexamethasone  4 mg Intravenous Q6H  . dronabinol  2.5 mg Oral BID AC  . DULoxetine  60 mg Oral Daily  . feeding supplement  1 Container Oral TID BM  . fluconazole  100 mg Oral Daily  . fondaparinux (ARIXTRA) injection  7.5 mg Subcutaneous q1800  . gabapentin  400 mg Oral BID  . hydrochlorothiazide  25 mg Oral Daily  . hydrocortisone   Rectal BID  . hydrocortisone cream   Topical BID  . irbesartan  150 mg Oral Daily  . lidocaine  1 patch Transdermal Q24H  . LORazepam  0.5 mg Oral BID  . LORazepam  1 mg Oral QHS  . magnesium hydroxide  15 mL Oral Daily  . methadone  20 mg Oral Q8H  . methocarbamol  1,000 mg Oral QID  . metoprolol succinate  100 mg Oral QHS  . nutrition supplement (JUVEN)  1 packet Oral BID BM  . OLANZapine  5 mg Oral QHS  . pantoprazole  40 mg Oral Daily  . polyethylene glycol  17 g Oral Daily  . senna-docusate  2 tablet Oral BID  . sodium chloride flush  3 mL Intravenous Q12H  . sucralfate  1 g Oral TID WC & HS   Continuous Infusions: . sodium chloride       LOS: 33 days    Time spent: Total of 25 minutes spent with pt,  greater than 50% of which was spent in discussion of  treatment, counseling and coordination of care   Chipper Oman, MD Pager: Text Page via [email or web address]   If 7PM-7AM, please contact night-coverage [email or web address] 01/19/2018, 3:47 PM   Note - This record has been created using [employer]. Chart creation errors have been sought,  but may not always have been located. Such creation errors do not reflect on the standard of medical care.

## 2018-01-20 MED ORDER — HYDROMORPHONE HCL 8 MG PO TABS
8.0000 mg | ORAL_TABLET | ORAL | Status: DC | PRN
Start: 2018-01-20 — End: 2018-01-23
  Administered 2018-01-20 – 2018-01-23 (×11): 8 mg via ORAL
  Filled 2018-01-20 (×11): qty 1

## 2018-01-20 MED ORDER — DEXAMETHASONE 4 MG PO TABS
4.0000 mg | ORAL_TABLET | Freq: Three times a day (TID) | ORAL | Status: DC
  Administered 2018-01-20 – 2018-01-22 (×5): 4 mg via ORAL
  Filled 2018-01-20 (×6): qty 1

## 2018-01-20 MED ORDER — CEPHALEXIN 500 MG PO CAPS
500.0000 mg | ORAL_CAPSULE | Freq: Once | ORAL | Status: AC
  Administered 2018-01-21: 500 mg via ORAL
  Filled 2018-01-20: qty 1

## 2018-01-20 NOTE — Progress Notes (Signed)
PROGRESS NOTE [facility]   Tony [name]   WER:[id number] DOB: Jul 16, 1965  DOA: 12/17/2017 PCP: [name], MD   Brief Narrative:  Tony [name] is a 52 year old male with medical history of PAD, status post femorofemoral bypass, thrombectomy and post wound infection.  Patient was admitted on 12/17/17 due to increase drainage from incisions from his left groin and left knee.  Patient underwent our to bifemoral bypass on 6/6 due to critical left limb ischemia.  Upon ED evaluation neck swelling was noted and CT of the neck showed s[iculated nodules in RUL with cervical lymphadenopathy and metastatic nodes in the neck and upper mediastinum this was concern for lung CA.  Biopsy was done and was consistent with poorly differentiated adenocarcinoma.  Oncology was consulted and recommended radiotherapy.  During hospital stay was noted to have venous thrombosis of the right subclavian vein with SVC syndrome.  Hospital course has been complicated with difficult pain control, AKI and radiation dermatitis.  Subjective: Patient reports pain has significantly improved, was able to walk outside with wife with no issues.  Assessment & Plan: Metastatic Poorly differentiated adenocarcinoma,  lung primary Cervical mediastinal and axillary lymphadenopathy and SCV syndrome Prolonged hospital stay due to pain control and complication with SVC syndrome along with lymphadenopathy.  Radiation oncology and vascular surgery were consulted. Currently receiving radiation, per med oncology planning for chemotherapy at some as outpatient. Swelling of the right arm has significantly decreased.  Vascular surgery recommending to complete radiation therapy before considering endovascular approach for SVC. CT of neck showed progression of disease, I discussed with rad onc whom did not recommend additional changes. Med oncology now recommending to start palliative chemo this upcoming week.  DVT upper  extremity Multiple places patient currently on Arixtras, per oncology. Will continue current regimen  Lower extremity edema - compression ACE advised   Pain due to malignancy Pain improving, decreasing the amount of IV pain medication requirement.. Palliative care currently taking care of pain management.  Currently on methadone and doing well at increased dose. Also on Robaxin, Decadron and Tylenol [place].  Goal is to decrease IV Dilaudid and use oral hydromorphone for breakthrough pain along with methadone.  Currently he is using 0.5 mg of IV Dilaudid and 4 mg of p.o. Dilaudid.  I am going to increase the oral Dilaudid to 8 mg with the intention to eventually discontinue the IV.  I have discussed this with the patient and he agrees to try his only the oral and use the IV only if really necessary.  Pain score goal is 3-4 where he feels comfortable.  Patient also on Cymbalta and gabapentin.  Taper Decadron today switch to 4 mg every 8, Decadron has helped with inflammation significantly.  Right foot gangrene/Wound infection  Per vascular surgery planning for outpatient management Infectious process has resolved   Anxiety and bipolar disorder - Stable  On Zyprexa, Ativan and Cymbalta this is heavily contributed to uncontrolled pain.  Radiation dermatitis - stable  Unlikely to be cellulitis, however patient completed treatement with cefazolin and Keflex. Continue  hydrocortisone 1% twice a day. Rad onc to weight in regarding skin toxicity.   Neck swelling related to rapid progression of lymphadenopathy - neck swelling decreasing. Difficulty with swallow has resolved after starting fluconazole CT neck shows Rapid disease progression with increased right greater than left neck ancillary and mediastinal lymphadenopathy.  Severe right anterolateral thighheel edema and retropharyngeal effusion resulting in tracheal displacement, patent airway. Planning for palliative chemo next week  Oral thrush   Continue Fluconazole to complete 14 days   Anemia of chronic disease Hgb stable, no signs of bleeding  Transfused 1 unit of PRBC's Transfuse if hgb < 8 per oncology  Check CBC in a.m.  Severe constipation - resolved Continue bowel regimen  DVT prophylaxis: Arixtra Code Status: Full code Family Communication: Family at bedside Disposition Plan: Hopefully home on Monday if we achieve pain control   Consultants:   Palliative care  Oncology/radiation  Vascular surgery  Procedures:   XRT  Antimicrobials: Anti-infectives (From admission, onward)   Start     Dose/Rate Route Frequency Ordered Stop   01/16/18 1600  fluconazole (DIFLUCAN) tablet 100 mg     100 mg Oral Daily 01/16/18 1511     01/10/18 1200  cephALEXin (KEFLEX) capsule 500 mg     500 mg Oral Every 6 hours 01/10/18 1007 01/14/18 2359   01/10/18 1000  cephALEXin (KEFLEX) capsule 500 mg  Status:  Discontinued     500 mg Oral 3 times daily 01/10/18 0807 01/10/18 1007   01/06/18 1100  ceFAZolin (ANCEF) IVPB 1 g/50 mL premix  Status:  Discontinued     1 g 100 mL/hr over 30 Minutes Intravenous Every 8 hours 01/06/18 1038 01/10/18 0804   12/22/17 1300  amoxicillin-clavulanate (AUGMENTIN) 875-125 MG per tablet 1 tablet  Status:  Discontinued     1 tablet Oral Every 12 hours 12/22/17 1219 12/30/17 1520   12/20/17 1400  Ampicillin-Sulbactam (UNASYN) 3 g in sodium chloride 0.9 % 100 mL IVPB  Status:  Discontinued     3 g 200 mL/hr over 30 Minutes Intravenous Every 6 hours 12/20/17 1145 12/22/17 1219   12/20/17 0000  vancomycin (VANCOCIN) 500 mg in sodium chloride 0.9 % 100 mL IVPB  Status:  Discontinued     500 mg 100 mL/hr over 60 Minutes Intravenous Every 12 hours 12/19/17 1500 12/20/17 1145   12/18/17 0300  vancomycin (VANCOCIN) IVPB 1000 mg/200 mL premix  Status:  Discontinued     1,000 mg 200 mL/hr over 60 Minutes Intravenous Every 12 hours 12/17/17 1354 12/19/17 1500   12/17/17 2000  piperacillin-tazobactam (ZOSYN)  IVPB 3.375 g  Status:  Discontinued     3.375 g 12.5 mL/hr over 240 Minutes Intravenous Every 8 hours 12/17/17 1354 12/20/17 1145   12/17/17 1430  piperacillin-tazobactam (ZOSYN) IVPB 3.375 g     3.375 g 100 mL/hr over 30 Minutes Intravenous  Once 12/17/17 1351 12/17/17 1916   12/17/17 1430  vancomycin (VANCOCIN) 1,750 mg in sodium chloride 0.9 % 500 mL IVPB     1,750 mg 250 mL/hr over 120 Minutes Intravenous  Once 12/17/17 1351 12/17/17 1747       Objective: Vitals:   01/19/18 1308 01/19/18 2109 01/20/18 0630 01/20/18 1527  BP: (!) 158/99 (!) 161/93 (!) 160/88 (!) 154/95  Pulse: 72 99 68 96  Resp: 18 16 18 18   Temp: 98.2 F (36.8 C) 98.6 F (37 C) 98.1 F (36.7 C) 98.5 F (36.9 C)  TempSrc: Oral Oral Oral Oral  SpO2: 99% 95% 97% 97%  Weight:      Height:       No intake or output data in the 24 hours ending 01/20/18 1625 Filed Weights   12/18/17 1613 01/01/18 1100  Weight: 71.6 kg 86.1 kg    Examination:   General: NAD  Cardiovascular: RRR, S1/S2 Respiratory: CTA bilaterally Abdominal: Soft Extremities: B/L LE edema 2+ pitting  Skin: Neck swelling decreased and erythema slight  improved   Data Reviewed: I have personally reviewed following labs and imaging studies  CBC: Recent Labs  Lab 01/16/18 0729 01/17/18 0435 01/18/18 0800  WBC 9.5 10.2 10.8*  HGB 7.5* 7.5* 9.1*  HCT 23.8* 23.7* 29.1*  MCV 90.8 90.8 91.2  PLT 342 330 400   Basic Metabolic Panel: Recent Labs  Lab 01/16/18 0729 01/18/18 0800  NA 140 141  K 4.2 4.7  CL 100 102  CO2 29 29  GLUCOSE 123* 130*  BUN 19 21*  CREATININE 0.69 0.75  CALCIUM 8.7* 9.1   GFR: Estimated Creatinine Clearance: 136 mL/min (by C-G formula based on SCr of 0.75 mg/dL). Liver Function Tests: No results for input(s): AST, ALT, ALKPHOS, BILITOT, PROT, ALBUMIN in the last 168 hours. No results for input(s): LIPASE, AMYLASE in the last 168 hours. No results for input(s): AMMONIA in the last 168  hours. Coagulation Profile: No results for input(s): INR, PROTIME in the last 168 hours. Cardiac Enzymes: No results for input(s): CKTOTAL, CKMB, CKMBINDEX, TROPONINI in the last 168 hours. BNP (last 3 results) No results for input(s): PROBNP in the last 8760 hours. HbA1C: No results for input(s): HGBA1C in the last 72 hours. CBG: No results for input(s): GLUCAP in the last 168 hours. Lipid Profile: No results for input(s): CHOL, HDL, LDLCALC, TRIG, CHOLHDL, LDLDIRECT in the last 72 hours. Thyroid Function Tests: No results for input(s): TSH, T4TOTAL, FREET4, T3FREE, THYROIDAB in the last 72 hours. Anemia Panel: No results for input(s): VITAMINB12, FOLATE, FERRITIN, TIBC, IRON, RETICCTPCT in the last 72 hours. Sepsis Labs: No results for input(s): PROCALCITON, LATICACIDVEN in the last 168 hours.  No results found for this or any previous visit (from the past 240 hour(s)).    Radiology Studies: No results found.    Scheduled Meds: . acetaminophen  1,000 mg Oral TID  . atorvastatin  40 mg Oral Daily  . dexamethasone  4 mg Intravenous Q6H  . dronabinol  2.5 mg Oral BID AC  . DULoxetine  60 mg Oral Daily  . feeding supplement  1 Container Oral TID BM  . fluconazole  100 mg Oral Daily  . fondaparinux (ARIXTRA) injection  7.5 mg Subcutaneous q1800  . gabapentin  400 mg Oral BID  . hydrochlorothiazide  25 mg Oral Daily  . hydrocortisone   Rectal BID  . hydrocortisone cream   Topical BID  . irbesartan  150 mg Oral Daily  . lidocaine  1 patch Transdermal Q24H  . LORazepam  0.5 mg Oral BID  . LORazepam  1 mg Oral QHS  . magnesium hydroxide  15 mL Oral Daily  . methadone  20 mg Oral Q8H  . methocarbamol  1,000 mg Oral QID  . metoprolol succinate  100 mg Oral QHS  . nutrition supplement (JUVEN)  1 packet Oral BID BM  . OLANZapine  5 mg Oral QHS  . pantoprazole  40 mg Oral Daily  . polyethylene glycol  17 g Oral Daily  . senna-docusate  2 tablet Oral BID  . sodium chloride  flush  3 mL Intravenous Q12H  . sucralfate  1 g Oral TID WC & HS   Continuous Infusions: . sodium chloride       LOS: 34 days    Time spent: Total of 15 minutes spent with pt, greater than 50% of which was spent in discussion of  treatment, counseling and coordination of care   Chipper Oman, MD Pager: Text Page via [email or web address]   If 7PM-7AM, please  contact night-coverage [email or web address] 01/20/2018, 4:25 PM   Note - This record has been created using [employer]. Chart creation errors have been sought, but may not always have been located. Such creation errors do not reflect on the standard of medical care.

## 2018-01-20 NOTE — Plan of Care (Signed)
Struggling with pain relief, alternating Dilaudid IV and Dilaudid po.

## 2018-01-20 NOTE — Progress Notes (Signed)
Daily Progress Note   Patient Name: Tony [name]       Date: 01/20/2018 DOB: 1965-11-01  Age: 52 y.o. MRN#: [id number] Attending Physician: Patrecia Pour, Christean Grief, MD Primary Care Physician: [name], MD Admit Date: 12/17/2017  Reason for Consultation/Follow-up: Establishing goals of care  Subjective: Patient reports pain is much improved today. Says he was able to get outside last night to enjoy the night air.   Length of Stay: 34  Current Medications: Scheduled Meds:  . acetaminophen  1,000 mg Oral TID  . atorvastatin  40 mg Oral Daily  . dexamethasone  4 mg Intravenous Q6H  . dronabinol  2.5 mg Oral BID AC  . DULoxetine  60 mg Oral Daily  . feeding supplement  1 Container Oral TID BM  . fluconazole  100 mg Oral Daily  . fondaparinux (ARIXTRA) injection  7.5 mg Subcutaneous q1800  . gabapentin  400 mg Oral BID  . hydrochlorothiazide  25 mg Oral Daily  . hydrocortisone   Rectal BID  . hydrocortisone cream   Topical BID  . irbesartan  150 mg Oral Daily  . lidocaine  1 patch Transdermal Q24H  . LORazepam  0.5 mg Oral BID  . LORazepam  1 mg Oral QHS  . magnesium hydroxide  15 mL Oral Daily  . methadone  20 mg Oral Q8H  . methocarbamol  1,000 mg Oral QID  . metoprolol succinate  100 mg Oral QHS  . nutrition supplement (JUVEN)  1 packet Oral BID BM  . OLANZapine  5 mg Oral QHS  . pantoprazole  40 mg Oral Daily  . polyethylene glycol  17 g Oral Daily  . senna-docusate  2 tablet Oral BID  . sodium chloride flush  3 mL Intravenous Q12H  . sucralfate  1 g Oral TID WC & HS    Continuous Infusions: . sodium chloride      PRN Meds: sodium chloride, alum & mag hydroxide-simeth, bisacodyl, hydrALAZINE, HYDROmorphone (DILAUDID) injection, HYDROmorphone, labetalol, lactulose,  magic mouthwash, naLOXone (NARCAN)  injection, ondansetron, phenol, povidone-iodine, sodium chloride flush  Physical Exam  Constitutional: He is oriented to person, place, and time.  NAD  Neck:  Large R. Neck mass  Cardiovascular: Normal rate and regular rhythm.  Pulmonary/Chest: Effort normal and breath sounds normal.  Abdominal: Soft. Bowel sounds are  normal.  Musculoskeletal: He exhibits edema.  Neurological: He is alert and oriented to person, place, and time.  Skin: Skin is warm and dry.            Vital Signs: BP (!) 160/88 (BP Location: Left Arm)   Pulse 68   Temp 98.1 F (36.7 C) (Oral)   Resp 18   Ht 6\' 5"  (1.956 m)   Wt 86.1 kg   SpO2 97%   BMI 22.51 kg/m  SpO2: SpO2: 97 % O2 Device: O2 Device: Room Air O2 Flow Rate: O2 Flow Rate (L/min): 2 L/min  Intake/output summary:  No intake or output data in the 24 hours ending 01/20/18 1115 LBM: Last BM Date: 01/20/18 Baseline Weight: Weight: 71.6 kg Most recent weight: Weight: 86.1 kg       Palliative Assessment/Data:    Flowsheet Rows     Most Recent Value  Intake Tab  Referral Department  Surgery  Palliative Care Primary Diagnosis  Cancer  Date Notified  12/25/17  Palliative Care Type  New Palliative care  Reason for referral  Clarify Goals of Care, Non-pain Symptom  Date of Admission  12/17/17  Date first seen by Palliative Care  12/26/17  # of days Palliative referral response time  1 Day(s)  # of days IP prior to Palliative referral  8  Clinical Assessment  Psychosocial & Spiritual Assessment  Palliative Care Outcomes      Patient Active Problem List   Diagnosis Date Noted  . Thrush of mouth and esophagus ([place]) 01/16/2018  . Counseling regarding advance care planning and goals of care 01/01/2018  . Adenocarcinoma ([place])   . SVC syndrome 12/31/2017  . Adenocarcinoma of left lung, stage 4 ([place]) 12/31/2017  . Mass in neck   . Neck pain due to malignant neoplastic disease ([place])   . Palliative care  by specialist   . Uncontrolled pain   . Palliative care encounter   . Malignant neoplasm of upper lobe of right lung ([place]) 12/25/2017  . Gangrene of right foot ([place]) 12/20/2017  . AKI (acute kidney injury) ([place]) 12/20/2017  . PAD (peripheral artery disease) ([place]) 12/19/2017  . S/P aortobifemoral bypass surgery 12/17/2017  . Lung mass 12/17/2017  . Postoperative wound infection 12/17/2017  . Weight loss 12/17/2017  . Cervical lymphadenopathy 12/17/2017  . DVT (deep venous thrombosis) ([place]) 12/17/2017  . Aortoiliac occlusive disease ([place]) 11/15/2017  . Pain of right lower extremity due to ischemia 11/13/2017  . Hyperlipidemia LDL goal <70 11/13/2017  . PVD (peripheral vascular disease) ([place]) 11/11/2017  . H. pylori infection 10/11/2011  . Duodenal ulcer 08/23/2011  . IBS (irritable bowel syndrome) 08/18/2011  . Depression with anxiety 05/11/2009  . EPIGASTRIC PAIN, CHRONIC 05/11/2009  . SMOKER 09/01/2008  . INSOMNIA, CHRONIC 09/01/2008  . Essential hypertension 09/01/2008  . PALPITATIONS, CHRONIC 09/01/2008  . GASTRITIS 10/24/2007    Palliative Care Assessment & Plan   Patient Profile: Tony [name] is a 52 year old man with multiple medical problems including PAD s/p bifemoral bypass on 6/6 due to critical L. Limb ischemia, thrombectomy and postop wound infection, who was admitted on 12/17/17 due to increased drainage from incisions from his L. Groin and knee. He was found to have neck swelling with CT revealing spiculated nodules in the RUL, cervical lymphadenopathy, and metastatic nodes in the neck and upper mediastinum. Biopsy was + for poorly differentiated adenocarcinoma. Patient has been started on XRT. His hospitalization has been complicated by SVC syndrome and severe pain.  Assessment: Pain is reportedly greatly improved today. He is still requiring frequent dosing of both IV and PO hydromorphone. In past 24 hours he has had 7 doses of IV hydromorphone (total 3.5mg ) and 5  doses of PO hydromorphone (total 32mg ). We spoke again about eventual plan to try to manage patient with PO meds, although understandably patient finds the IV preferable given the rapid onset when he is hurting. I am hopeful that his prn requirements will decrease some with increased methadone dose.   For now, would recommend continuation of current pain regimen.   Recommendations/Plan:  Continue current pain regimen  Plan is for community palliative care to help with methadone prescribing at home.    Thank you for allowing the Palliative Medicine Team to assist in the care of this patient.   Time In: 1045 Time Out: 1115 Total Time 30 minutes Prolonged Time Billed  NO      Greater than 50%  of this time was spent counseling and coordinating care related to the above assessment and plan.  Irean Hong, NP  Please contact Palliative Medicine Team phone at [phone] for questions and concerns.

## 2018-01-21 ENCOUNTER — Ambulatory Visit
Admit: 2018-01-21 | Discharge: 2018-01-21 | Disposition: A | Discharge status: Home / Self Care | Payer: BLUE CROSS/BLUE SHIELD | Attending: Radiation Oncology | Admitting: Radiation Oncology

## 2018-01-21 LAB — CBC
HEMATOCRIT: 29.2 % — AB (ref 39.0–52.0)
Hemoglobin: 9.1 g/dL — ABNORMAL LOW (ref 13.0–17.0)
MCH: 28.5 pg (ref 26.0–34.0)
MCHC: 31.2 g/dL (ref 30.0–36.0)
MCV: 91.5 fL (ref 78.0–100.0)
Platelets: 406 10*3/uL — ABNORMAL HIGH (ref 150–400)
RBC: 3.19 MIL/uL — ABNORMAL LOW (ref 4.22–5.81)
RDW: 15.5 % (ref 11.5–15.5)
WBC: 9.1 10*3/uL (ref 4.0–10.5)

## 2018-01-21 LAB — BASIC METABOLIC PANEL
Anion gap: 11 (ref 5–15)
BUN: 21 mg/dL — AB (ref 6–20)
CO2: 27 mmol/L (ref 22–32)
Calcium: 9.2 mg/dL (ref 8.9–10.3)
Chloride: 103 mmol/L (ref 98–111)
Creatinine, Ser: 0.68 mg/dL (ref 0.61–1.24)
GFR calc Af Amer: >60 mL/min (ref >60)
GLUCOSE: 126 mg/dL — AB (ref 70–99)
POTASSIUM: 4 mmol/L (ref 3.5–5.1)
Sodium: 141 mmol/L (ref 135–145)

## 2018-01-21 MED ORDER — GABAPENTIN 400 MG PO CAPS
800.0000 mg | ORAL_CAPSULE | Freq: Every day | ORAL | Status: DC
  Administered 2018-01-21 – 2018-01-22 (×2): 800 mg via ORAL
  Filled 2018-01-21 (×2): qty 2

## 2018-01-21 MED ORDER — SULFAMETHOXAZOLE-TRIMETHOPRIM 800-160 MG PO TABS
1.0000 | ORAL_TABLET | Freq: Two times a day (BID) | ORAL | Status: DC
  Administered 2018-01-21 – 2018-01-23 (×5): 1 via ORAL
  Filled 2018-01-21 (×5): qty 1

## 2018-01-21 MED ORDER — SUCRALFATE 1 G PO TABS: 1.0000 g | ORAL_TABLET | Freq: Four times a day (QID) | ORAL | Status: DC | PRN

## 2018-01-21 MED ORDER — HYDROMORPHONE HCL 8 MG PO TABS
10.0000 mg | ORAL_TABLET | Freq: Every day | ORAL | Status: DC
  Administered 2018-01-22: 10 mg via ORAL
  Filled 2018-01-21 (×2): qty 1

## 2018-01-21 NOTE — Progress Notes (Signed)
PROGRESS NOTE [facility]   KETIH GOODIE   MWU:[id number] DOB: 1965-09-22  DOA: 12/17/2017 PCP: [name], MD   Brief Narrative:  ASAHD CAN is a 52 year old male with medical history of PAD, status post femorofemoral bypass, thrombectomy and post wound infection.  Patient was admitted on 12/17/17 due to increase drainage from incisions from his left groin and left knee.  Patient underwent our to bifemoral bypass on 6/6 due to critical left limb ischemia.  Upon ED evaluation neck swelling was noted and CT of the neck showed s[iculated nodules in RUL with cervical lymphadenopathy and metastatic nodes in the neck and upper mediastinum this was concern for lung CA.  Biopsy was done and was consistent with poorly differentiated adenocarcinoma.  Oncology was consulted and recommended radiotherapy.  During hospital stay was noted to have venous thrombosis of the right subclavian vein with SVC syndrome.  Hospital course has been complicated with difficult pain control, AKI and radiation dermatitis.  Subjective: Patient report having a difficult night, with significant pain. Skin look more erythematous. Was not able to sleep well.   Assessment & Plan: Metastatic Poorly differentiated adenocarcinoma,  lung primary Cervical mediastinal and axillary lymphadenopathy and SCV syndrome Prolonged hospital stay due to pain control and complication with SVC syndrome along with lymphadenopathy.  Radiation oncology and vascular surgery were consulted. Currently receiving radiation, per med oncology planning for chemotherapy at some as outpatient. Swelling of the right arm has significantly decreased.  Vascular surgery recommending to complete radiation therapy before considering endovascular approach for SVC. CT of neck showed progression of disease, I discussed with rad onc whom did not recommend additional changes. Med oncology now recommending to start palliative chemo this upcoming week.  DVT  upper extremity Multiple places patient currently on Arixtras, per oncology. Will continue current regimen  Lower extremity edema - compression ACE advised   Pain due to malignancy Pain improving, decreasing the amount of IV pain medication requirement.. Palliative care currently taking care of pain management. Currently on Methadone 20 mg TID, IV Dilaudid 0.5 mg every 2 hours as needed, oral Dilaudid was increased to 8 mg every 3 hours as needed.  Decadron was decreased to 4 mg every 8 hours scheduled.  Patient report doing poorly overnight with pain, however requirements are not as high as before.  Seems that there is apparent with pain control at night.  Perhaps may need a higher dose of methadone at night.  Will discuss with palliative care team.  Patient also on Robaxin, Tylenol, Cymbalta and gabapentin schedule.  Skin seems to be more erythematous today ?  If skin infection contributing.  Will treat with Bactrim.  Right foot gangrene/Wound infection  Per vascular surgery planning for outpatient management Infectious process has resolved   Anxiety and bipolar disorder - Stable  On Zyprexa, Ativan and Cymbalta this is heavily contributed to uncontrolled pain.  Radiation dermatitis - worsen today may developed cellulitis  Unlikely to be cellulitis, however patient completed treatement with cefazolin and Keflex. Continue  hydrocortisone 1% twice a day.  Will add Bactrim for now.  Neck swelling related to rapid progression of lymphadenopathy - neck swelling decreasing. Difficulty with swallow has resolved after starting fluconazole CT neck shows Rapid disease progression with increased right greater than left neck ancillary and mediastinal lymphadenopathy.  Severe right anterolateral thighheel edema and retropharyngeal effusion resulting in tracheal displacement, patent airway. Planning for palliative chemo next week   Oral thrush  Continue Fluconazole to complete 14 days  Anemia of chronic  disease Hemoglobin remains stable Transfused 1 unit of PRBC's Transfuse if hgb < 8 per oncology   Severe constipation - resolved Continue bowel regimen  DVT prophylaxis: Arixtra Code Status: Full code Family Communication: Family at bedside Disposition Plan: Hopefully home in 1 to 2-day if we can achieve pain control.  Consultants:   Palliative care  Oncology/radiation  Vascular surgery  Procedures:   XRT  Antimicrobials: Anti-infectives (From admission, onward)   Start     Dose/Rate Route Frequency Ordered Stop   01/20/18 2300  cephALEXin (KEFLEX) capsule 500 mg     500 mg Oral Once 01/20/18 2258 01/21/18 0032   01/16/18 1600  fluconazole (DIFLUCAN) tablet 100 mg     100 mg Oral Daily 01/16/18 1511     01/10/18 1200  cephALEXin (KEFLEX) capsule 500 mg     500 mg Oral Every 6 hours 01/10/18 1007 01/14/18 2359   01/10/18 1000  cephALEXin (KEFLEX) capsule 500 mg  Status:  Discontinued     500 mg Oral 3 times daily 01/10/18 0807 01/10/18 1007   01/06/18 1100  ceFAZolin (ANCEF) IVPB 1 g/50 mL premix  Status:  Discontinued     1 g 100 mL/hr over 30 Minutes Intravenous Every 8 hours 01/06/18 1038 01/10/18 0804   12/22/17 1300  amoxicillin-clavulanate (AUGMENTIN) 875-125 MG per tablet 1 tablet  Status:  Discontinued     1 tablet Oral Every 12 hours 12/22/17 1219 12/30/17 1520   12/20/17 1400  Ampicillin-Sulbactam (UNASYN) 3 g in sodium chloride 0.9 % 100 mL IVPB  Status:  Discontinued     3 g 200 mL/hr over 30 Minutes Intravenous Every 6 hours 12/20/17 1145 12/22/17 1219   12/20/17 0000  vancomycin (VANCOCIN) 500 mg in sodium chloride 0.9 % 100 mL IVPB  Status:  Discontinued     500 mg 100 mL/hr over 60 Minutes Intravenous Every 12 hours 12/19/17 1500 12/20/17 1145   12/18/17 0300  vancomycin (VANCOCIN) IVPB 1000 mg/200 mL premix  Status:  Discontinued     1,000 mg 200 mL/hr over 60 Minutes Intravenous Every 12 hours 12/17/17 1354 12/19/17 1500   12/17/17 2000   piperacillin-tazobactam (ZOSYN) IVPB 3.375 g  Status:  Discontinued     3.375 g 12.5 mL/hr over 240 Minutes Intravenous Every 8 hours 12/17/17 1354 12/20/17 1145   12/17/17 1430  piperacillin-tazobactam (ZOSYN) IVPB 3.375 g     3.375 g 100 mL/hr over 30 Minutes Intravenous  Once 12/17/17 1351 12/17/17 1916   12/17/17 1430  vancomycin (VANCOCIN) 1,750 mg in sodium chloride 0.9 % 500 mL IVPB     1,750 mg 250 mL/hr over 120 Minutes Intravenous  Once 12/17/17 1351 12/17/17 1747       Objective: Vitals:   01/20/18 0630 01/20/18 1527 01/20/18 2103 01/21/18 0615  BP: (!) 160/88 (!) 154/95 (!) 163/93 (!) 157/82  Pulse: 68 96 96 89  Resp: 18 18 18 18   Temp: 98.1 F (36.7 C) 98.5 F (36.9 C) 98.2 F (36.8 C) 98.7 F (37.1 C)  TempSrc: Oral Oral Oral Oral  SpO2: 97% 97% 99% 99%  Weight:      Height:        Intake/Output Summary (Last 24 hours) at 01/21/2018 1345 Last data filed at 01/20/2018 2200 Gross per 24 hour  Intake 0 ml  Output -  Net 0 ml   Filed Weights   12/18/17 1613 01/01/18 1100  Weight: 71.6 kg 86.1 kg    Examination:  General: Pt is alert, awake, not in acute distress Cardiovascular: RRR, S1/S2 +, no rubs, no gallops Respiratory: CTA bilaterally, no wheezing, no rhonchi Abdominal: Soft, NT, ND, bowel sounds + Extremities: Lateral lower extremity edema Skin: Erythema in the neck and chest, tender to palpation. Warm to touch   Data Reviewed: I have personally reviewed following labs and imaging studies  CBC: Recent Labs  Lab 01/16/18 0729 01/17/18 0435 01/18/18 0800 01/21/18 0730  WBC 9.5 10.2 10.8* 9.1  HGB 7.5* 7.5* 9.1* 9.1*  HCT 23.8* 23.7* 29.1* 29.2*  MCV 90.8 90.8 91.2 91.5  PLT 342 330 379 993*   Basic Metabolic Panel: Recent Labs  Lab 01/16/18 0729 01/18/18 0800 01/21/18 0730  NA 140 141 141  K 4.2 4.7 4.0  CL 100 102 103  CO2 29 29 27   GLUCOSE 123* 130* 126*  BUN 19 21* 21*  CREATININE 0.69 0.75 0.68  CALCIUM 8.7* 9.1 9.2    GFR: Estimated Creatinine Clearance: 136 mL/min (by C-G formula based on SCr of 0.68 mg/dL). Liver Function Tests: No results for input(s): AST, ALT, ALKPHOS, BILITOT, PROT, ALBUMIN in the last 168 hours. No results for input(s): LIPASE, AMYLASE in the last 168 hours. No results for input(s): AMMONIA in the last 168 hours. Coagulation Profile: No results for input(s): INR, PROTIME in the last 168 hours. Cardiac Enzymes: No results for input(s): CKTOTAL, CKMB, CKMBINDEX, TROPONINI in the last 168 hours. BNP (last 3 results) No results for input(s): PROBNP in the last 8760 hours. HbA1C: No results for input(s): HGBA1C in the last 72 hours. CBG: No results for input(s): GLUCAP in the last 168 hours. Lipid Profile: No results for input(s): CHOL, HDL, LDLCALC, TRIG, CHOLHDL, LDLDIRECT in the last 72 hours. Thyroid Function Tests: No results for input(s): TSH, T4TOTAL, FREET4, T3FREE, THYROIDAB in the last 72 hours. Anemia Panel: No results for input(s): VITAMINB12, FOLATE, FERRITIN, TIBC, IRON, RETICCTPCT in the last 72 hours. Sepsis Labs: No results for input(s): PROCALCITON, LATICACIDVEN in the last 168 hours.  Recent Results (from the past 240 hour(s))  Aerobic Culture (superficial specimen)     Status: None (Preliminary result)   Collection Time: 01/20/18 10:58 PM  Result Value Ref Range Status   Specimen Description WOUND LEFT LEG  Final   Special Requests WOUND LEFT LEG  Final   Gram Stain   Final    FEW WBC PRESENT, PREDOMINANTLY PMN NO ORGANISMS SEEN Performed at [facility], [address], [place], [place] [address]    Culture PENDING  Incomplete   Report Status PENDING  Incomplete      Radiology Studies: No results found.    Scheduled Meds: . acetaminophen  1,000 mg Oral TID  . atorvastatin  40 mg Oral Daily  . dexamethasone  4 mg Oral Q8H  . dronabinol  2.5 mg Oral BID AC  . DULoxetine  60 mg Oral Daily  . feeding supplement  1 Container Oral TID  BM  . fluconazole  100 mg Oral Daily  . fondaparinux (ARIXTRA) injection  7.5 mg Subcutaneous q1800  . gabapentin  400 mg Oral BID  . hydrochlorothiazide  25 mg Oral Daily  . hydrocortisone   Rectal BID  . hydrocortisone cream   Topical BID  . irbesartan  150 mg Oral Daily  . lidocaine  1 patch Transdermal Q24H  . LORazepam  0.5 mg Oral BID  . LORazepam  1 mg Oral QHS  . magnesium hydroxide  15 mL Oral Daily  .  methadone  20 mg Oral Q8H  . methocarbamol  1,000 mg Oral QID  . metoprolol succinate  100 mg Oral QHS  . nutrition supplement (JUVEN)  1 packet Oral BID BM  . OLANZapine  5 mg Oral QHS  . pantoprazole  40 mg Oral Daily  . polyethylene glycol  17 g Oral Daily  . senna-docusate  2 tablet Oral BID  . sodium chloride flush  3 mL Intravenous Q12H  . sucralfate  1 g Oral TID WC & HS   Continuous Infusions: . sodium chloride       LOS: 35 days   Time spent: Total of 25 minutes spent with pt, greater than 50% of which was spent in discussion of  treatment, counseling and coordination of care  Chipper Oman, MD Pager: Text Page via [email or web address]   If 7PM-7AM, please contact night-coverage [email or web address] 01/21/2018, 1:45 PM   Note - This record has been created using [employer]. Chart creation errors have been sought, but may not always have been located. Such creation errors do not reflect on the standard of medical care.

## 2018-01-21 NOTE — Progress Notes (Signed)
Palliative Care  Progress Note  Met with patient and his wife-continues to wake up every night with severe pain in his neck around 2AM-may be positional. He does have new pain mostly related to his skin changes where he is receiving radiation.  Tonight will give dose of '10mg'$  oral dilaudid at 12 midnight, I also increased QHS dose of gabapentin.Topical steroid cream has been given for radiation burn.  Plan for discharge tomorrow-long discussion about the transition home.Provided reassurance and support.   Goals of Care:  1. Patient plans to pursue chemotherapy and complete radiation as recommended by Oncology Service.  2. He wants to live as long as possible with a good QOL, aggressive pain control, maintain his functional status for as long as possible.  3. He agrees to ongoing support from palliative care.   Plan: No changes made today to methadone-increased 2 days ago.  Time: 35 minutes Greater than 50%  of this time was spent counseling and coordinating care related to the above assessment and plan.   [name], DO Palliative Medicine

## 2018-01-21 NOTE — Progress Notes (Signed)
[facility] Therapy Treatment Record Phone [phone]   Radiation Therapy was administered to Tony [name] on: 01/21/2018  2:48 PM and was treatment # 16 out of a planned course of 32 treatments.  Radiation Treatment  1). Beam photons with 6-10 energy  2). Brachytherapy None  3). Stereotactic Radiosurgery None  4). Other Radiation None     Mayia Megill J Joncarlo Friberg, RT

## 2018-01-22 ENCOUNTER — Ambulatory Visit
Admit: 2018-01-22 | Discharge: 2018-01-22 | Disposition: A | Discharge status: Home / Self Care | Payer: BLUE CROSS/BLUE SHIELD | Attending: Radiation Oncology | Admitting: Radiation Oncology

## 2018-01-22 LAB — BASIC METABOLIC PANEL
ANION GAP: 12 (ref 5–15)
BUN: 23 mg/dL — ABNORMAL HIGH (ref 6–20)
CALCIUM: 9.3 mg/dL (ref 8.9–10.3)
CO2: 23 mmol/L (ref 22–32)
CREATININE: 0.73 mg/dL (ref 0.61–1.24)
Chloride: 103 mmol/L (ref 98–111)
GFR calc Af Amer: >60 mL/min (ref >60)
GFR calc non Af Amer: >60 mL/min (ref >60)
GLUCOSE: 188 mg/dL — AB (ref 70–99)
Potassium: 4.4 mmol/L (ref 3.5–5.1)
Sodium: 138 mmol/L (ref 135–145)

## 2018-01-22 LAB — MAGNESIUM: Magnesium: 1.7 mg/dL (ref 1.7–2.4)

## 2018-01-22 MED ORDER — TRIAMCINOLONE 0.1 % CREAM:EUCERIN CREAM 1:1
TOPICAL_CREAM | Freq: Three times a day (TID) | CUTANEOUS | Status: DC
  Administered 2018-01-22: 1 via TOPICAL
  Administered 2018-01-22 – 2018-01-23 (×2): via TOPICAL
  Filled 2018-01-22: qty 1

## 2018-01-22 MED ORDER — DEXAMETHASONE 4 MG PO TABS
4.0000 mg | ORAL_TABLET | Freq: Two times a day (BID) | ORAL | Status: DC
  Administered 2018-01-22 – 2018-01-23 (×2): 4 mg via ORAL
  Filled 2018-01-22 (×2): qty 1

## 2018-01-22 MED ORDER — MAGNESIUM OXIDE 400 (241.3 MG) MG PO TABS
800.0000 mg | ORAL_TABLET | Freq: Once | ORAL | Status: AC
  Administered 2018-01-22: 800 mg via ORAL
  Filled 2018-01-22: qty 2

## 2018-01-22 NOTE — Progress Notes (Signed)
Patient did not receive the new order for po Dilaudid that was ordered for midnight because he was sleep and would not wake up. As needed IV Dilaudid given twice; 19:00 and 04:55.  Will continue to monitor patient's pain level.

## 2018-01-22 NOTE — Progress Notes (Signed)
Physical Therapy Treatment Patient Details Name: Tony [name] MRN: [id number] DOB: [birth year] Today's Date: 01/22/2018    History of Present Illness 52 year old man PMH critical limb ischemia, status post aortobifemoral bypass, right femoral artery to popliteal bypass, status post thrombectomy left limb aVF bypass.  Discharged on Xarelto.  Presented 7/8 with drainage from left groin wound and left below-knee incision.  Admitted by vascular surgery and started on IV antibiotics.  Because of neck pain, further imaging was obtained which revealed spiculated lung mass concerning for malignancy, patient transferred to hospitalist service. Pt found to have metastatic adenocarcinoma.     PT Comments    Pt has been amb in hallway at lib using SPC.  Assisted with negotiating multiple stairs.  Pt was able to safely asend and descend multiple stairs using one rail and SPC.   Pt plans to D/C to home tomorrow.    Follow Up Recommendations  Home health PT;Supervision - Intermittent     Equipment Recommendations  None recommended by PT    Recommendations for Other Services       Precautions / Restrictions Precautions Precautions: Fall Precaution Comments: R neck and UE edema, gangrenous R toes  Restrictions Weight Bearing Restrictions: No    Mobility  Bed Mobility               General bed mobility comments: OOB in recliner  Transfers Overall transfer level: Modified independent Equipment used: None Transfers: Sit to/from [employer] transfer comment: good safety cognition and use of hands to steady self when needed  Ambulation/Gait Ambulation/Gait assistance: Modified independent (Device/Increase time);Supervision Gait Distance (Feet): 80 Feet Assistive device: Straight cane Gait Pattern/deviations: Step-through pattern Gait velocity: WFL   General Gait Details: functional gait speed and proper use of cane    no true LOB     Stairs Stairs: Yes Stairs assistance: Supervision Stair Management: One rail Left;With cane Number of Stairs: 8 General stair comments: step through going up and step to going down.  Tolerated well   steady   Wheelchair Mobility    Modified Rankin (Stroke Patients Only)       Balance                                            Cognition Arousal/Alertness: Awake/alert Behavior During Therapy: WFL for tasks assessed/performed Overall Cognitive Status: Within Functional Limits for tasks assessed                                 General Comments: pt excited to be going home tomorrow      Exercises      General Comments        Pertinent Vitals/Pain Pain Assessment: No/denies pain    Home Living                      Prior Function            PT Goals (current goals can now be found in the care plan section) Progress towards PT goals: Progressing toward goals    Frequency    Min 1X/week      PT Plan Other (comment)(pt has met maximal mobility level for this acute care setting)    Co-evaluation  AM-PAC PT "6 Clicks" Daily Activity  Outcome Measure  Difficulty turning over in bed (including adjusting bedclothes, sheets and blankets)?: None Difficulty moving from lying on back to sitting on the side of the bed? : None Difficulty sitting down on and standing up from a chair with arms (e.g., wheelchair, bedside commode, etc,.)?: None Help needed moving to and from a bed to chair (including a wheelchair)?: None Help needed walking in hospital room?: None Help needed climbing 3-5 steps with a railing? : None 6 Click Score: 24    End of Session Equipment Utilized During Treatment: Gait belt Activity Tolerance: Patient tolerated treatment well Patient left: with call bell/phone within reach;in chair;with nursing/sitter in room Nurse Communication: Mobility status PT Visit Diagnosis: Other  abnormalities of gait and mobility (R26.89)     Time: 1355-1410 PT Time Calculation (min) (ACUTE ONLY): 15 min  Charges:  $Gait Training: 8-22 mins                     Rica Koyanagi  PTA [facility]  Acute  Rehab Pager      [phone]

## 2018-01-22 NOTE — Consult Note (Addendum)
WOC consult requested for gangrene areas to right toes.  This was already performed on 7/24; refer to previous consult note for assessment and measurements; and topical treatment orders were provided for Betadine application daily.  Pt has been followed by the vascular service previously and plans to follow-up with their team after discharge. He denies need to assess his feet today or further questions regarding topical treatment.   Left leg with previous surgical scar; there is a pinhole opening which has a large amt clear yellow drainage, no odor.  Opening is too narrow to pack; approx .1X.1cm with unknown depth.  Pt has been applying gauze or foam dressing to absorb drainage and protect from further injury.  Continue this plan of care; topical treatment will be minimally effective to promote healing since the drainage will not decrease, according to patient.  Encouraged him to follow-up with the vascular team after discharge for further questions and plan of care. Please re-consult if further assistance is needed.  Thank-you,  Julien Girt MSN, [place], [place], [place], [place]

## 2018-01-22 NOTE — Progress Notes (Signed)
Pt discharging home with [facility] [place] in am. Spoke with pt, who states his wife will be here at 1530. Spoke with pt's wife concerning discharge plan. Everything is set for discharge in the AM to home.

## 2018-01-22 NOTE — Progress Notes (Signed)
PROGRESS NOTE [facility]   Tony [name]   DPO:[id number] DOB: 1965-06-23  DOA: 12/17/2017 PCP: [name], MD   Brief Narrative:  Tony [name] is a 52 year old male with medical history of PAD, status post femorofemoral bypass, thrombectomy and post wound infection.  Patient was admitted on 12/17/17 due to increase drainage from incisions from his left groin and left knee.  Patient underwent our to bifemoral bypass on 6/6 due to critical left limb ischemia.  Upon ED evaluation neck swelling was noted and CT of the neck showed s[iculated nodules in RUL with cervical lymphadenopathy and metastatic nodes in the neck and upper mediastinum this was concern for lung CA.  Biopsy was done and was consistent with poorly differentiated adenocarcinoma.  Oncology was consulted and recommended radiotherapy.  During hospital stay was noted to have venous thrombosis of the right subclavian vein with SVC syndrome.  Hospital course has been complicated with difficult pain control, AKI and radiation dermatitis.  Subjective: Patient seen and examined, he did great last night, only required 1 dose of IV dilaudid. C/o cramping on hand, will check electrolytes.   Assessment & Plan: Metastatic Poorly differentiated adenocarcinoma,  lung primary Cervical mediastinal and axillary lymphadenopathy and SCV syndrome Prolonged hospital stay due to pain control and complication with SVC syndrome along with lymphadenopathy.  Radiation oncology and vascular surgery were consulted. Currently receiving radiation, per med oncology planning for chemotherapy at some as outpatient. Swelling of the right arm has significantly decreased.  Vascular surgery recommending to complete radiation therapy before considering endovascular approach for SVC. CT of neck showed progression of disease, I discussed with rad onc whom did not recommend additional changes. Med oncology now recommending to start palliative chemo this upcoming  week.  DVT upper extremity Multiple places patient currently on Arixtras, per oncology. Will continue current regimen  Lower extremity edema - compression ACE advised   Pain due to malignancy Pain improving, decreasing the amount of IV pain medication requirement.. Palliative care currently taking care of pain management. Currently on Methadone 20 mg TID, IV Dilaudid 0.5 mg every 2 hours as needed, oral Dilaudid was increased to 8 mg every 3 hours as needed. Will decrease Decadron to 4 mg every 12 hrs.  Patient report doing poorly overnight with pain, however requirements are not as high as before.  Seems that there is apparent with pain control at night.  Perhaps may need a higher dose of methadone at night.  Will discuss with palliative care team.  Patient also on Robaxin, Tylenol, Cymbalta and gabapentin schedule.  Right foot gangrene/Wound infection  Per vascular surgery planning for outpatient management Infectious process has resolved   Anxiety and bipolar disorder - Stable  On Zyprexa, Ativan and Cymbalta this is heavily contributed to uncontrolled pain.  Radiation dermatitis with cellulitis  Recurrent cellulitis patient completed treatement with cefazolin and Keflex.  However developed cellulitis again this could be a recurrent issue with radiation. Started on Bactrim, will continue for 7 days and reassess. Continue  hydrocortisone 1% twice a day.    Neck swelling related to rapid progression of lymphadenopathy - neck swelling decreasing. Difficulty with swallow has resolved after starting fluconazole CT neck shows rapid disease progression with increased right greater than left neck ancillary and mediastinal lymphadenopathy.  Severe right anterolateral edema and retropharyngeal effusion resulting in tracheal displacement, patent airway. Oncology planning for palliative chemo next week   Oral thrush  Continue Fluconazole to complete 14 days   Anemia of chronic disease  Hemoglobin  remains stable Transfused 1 unit of PRBC's Transfuse if hgb < 8 per oncology   Severe constipation - resolved Continue bowel regimen  DVT prophylaxis: Arixtra Code Status: Full code Family Communication: Family at bedside Disposition Plan: Home in AM with home health.   Consultants:   Palliative care  Oncology/radiation  Vascular surgery  Procedures:   XRT  Antimicrobials: Anti-infectives (From admission, onward)   Start     Dose/Rate Route Frequency Ordered Stop   01/21/18 1430  sulfamethoxazole-trimethoprim (BACTRIM DS,SEPTRA DS) 800-160 MG per tablet 1 tablet     1 tablet Oral Every 12 hours 01/21/18 1414     01/20/18 2300  cephALEXin (KEFLEX) capsule 500 mg     500 mg Oral Once 01/20/18 2258 01/21/18 0032   01/16/18 1600  fluconazole (DIFLUCAN) tablet 100 mg     100 mg Oral Daily 01/16/18 1511     01/10/18 1200  cephALEXin (KEFLEX) capsule 500 mg     500 mg Oral Every 6 hours 01/10/18 1007 01/14/18 2359   01/10/18 1000  cephALEXin (KEFLEX) capsule 500 mg  Status:  Discontinued     500 mg Oral 3 times daily 01/10/18 0807 01/10/18 1007   01/06/18 1100  ceFAZolin (ANCEF) IVPB 1 g/50 mL premix  Status:  Discontinued     1 g 100 mL/hr over 30 Minutes Intravenous Every 8 hours 01/06/18 1038 01/10/18 0804   12/22/17 1300  amoxicillin-clavulanate (AUGMENTIN) 875-125 MG per tablet 1 tablet  Status:  Discontinued     1 tablet Oral Every 12 hours 12/22/17 1219 12/30/17 1520   12/20/17 1400  Ampicillin-Sulbactam (UNASYN) 3 g in sodium chloride 0.9 % 100 mL IVPB  Status:  Discontinued     3 g 200 mL/hr over 30 Minutes Intravenous Every 6 hours 12/20/17 1145 12/22/17 1219   12/20/17 0000  vancomycin (VANCOCIN) 500 mg in sodium chloride 0.9 % 100 mL IVPB  Status:  Discontinued     500 mg 100 mL/hr over 60 Minutes Intravenous Every 12 hours 12/19/17 1500 12/20/17 1145   12/18/17 0300  vancomycin (VANCOCIN) IVPB 1000 mg/200 mL premix  Status:  Discontinued     1,000 mg 200 mL/hr  over 60 Minutes Intravenous Every 12 hours 12/17/17 1354 12/19/17 1500   12/17/17 2000  piperacillin-tazobactam (ZOSYN) IVPB 3.375 g  Status:  Discontinued     3.375 g 12.5 mL/hr over 240 Minutes Intravenous Every 8 hours 12/17/17 1354 12/20/17 1145   12/17/17 1430  piperacillin-tazobactam (ZOSYN) IVPB 3.375 g     3.375 g 100 mL/hr over 30 Minutes Intravenous  Once 12/17/17 1351 12/17/17 1916   12/17/17 1430  vancomycin (VANCOCIN) 1,750 mg in sodium chloride 0.9 % 500 mL IVPB     1,750 mg 250 mL/hr over 120 Minutes Intravenous  Once 12/17/17 1351 12/17/17 1747       Objective: Vitals:   01/21/18 2043 01/21/18 2044 01/22/18 0502 01/22/18 1235  BP: (!) 163/98 (!) 153/89 (!) 151/82   Pulse: (!) 102 85 96   Resp: 18  18   Temp: 97.8 F (36.6 C)  97.7 F (36.5 C)   TempSrc: Oral  Oral   SpO2: 100% 100% 99%   Weight:    77.7 kg  Height:       No intake or output data in the 24 hours ending 01/22/18 1307 Filed Weights   12/18/17 1613 01/01/18 1100 01/22/18 1235  Weight: 71.6 kg 86.1 kg 77.7 kg    Examination:   General:  NAD Cardiovascular: RRR, S1/S2 + Respiratory: CTA bilaterally Abdominal: Soft, NT, ND Extremities: Lower extremity edema 1-2+  Skin: Erythematous chest, no induration.   Data Reviewed: I have personally reviewed following labs and imaging studies  CBC: Recent Labs  Lab 01/16/18 0729 01/17/18 0435 01/18/18 0800 01/21/18 0730  WBC 9.5 10.2 10.8* 9.1  HGB 7.5* 7.5* 9.1* 9.1*  HCT 23.8* 23.7* 29.1* 29.2*  MCV 90.8 90.8 91.2 91.5  PLT 342 330 379 161*   Basic Metabolic Panel: Recent Labs  Lab 01/16/18 0729 01/18/18 0800 01/21/18 0730 01/22/18 0922  NA 140 141 141 138  K 4.2 4.7 4.0 4.4  CL 100 102 103 103  CO2 29 29 27 23   GLUCOSE 123* 130* 126* 188*  BUN 19 21* 21* 23*  CREATININE 0.69 0.75 0.68 0.73  CALCIUM 8.7* 9.1 9.2 9.3  MG  --   --   --  1.7   GFR: Estimated Creatinine Clearance: 122.8 mL/min (by C-G formula based on SCr of 0.73  mg/dL). Liver Function Tests: No results for input(s): AST, ALT, ALKPHOS, BILITOT, PROT, ALBUMIN in the last 168 hours. No results for input(s): LIPASE, AMYLASE in the last 168 hours. No results for input(s): AMMONIA in the last 168 hours. Coagulation Profile: No results for input(s): INR, PROTIME in the last 168 hours. Cardiac Enzymes: No results for input(s): CKTOTAL, CKMB, CKMBINDEX, TROPONINI in the last 168 hours. BNP (last 3 results) No results for input(s): PROBNP in the last 8760 hours. HbA1C: No results for input(s): HGBA1C in the last 72 hours. CBG: No results for input(s): GLUCAP in the last 168 hours. Lipid Profile: No results for input(s): CHOL, HDL, LDLCALC, TRIG, CHOLHDL, LDLDIRECT in the last 72 hours. Thyroid Function Tests: No results for input(s): TSH, T4TOTAL, FREET4, T3FREE, THYROIDAB in the last 72 hours. Anemia Panel: No results for input(s): VITAMINB12, FOLATE, FERRITIN, TIBC, IRON, RETICCTPCT in the last 72 hours. Sepsis Labs: No results for input(s): PROCALCITON, LATICACIDVEN in the last 168 hours.  Recent Results (from the past 240 hour(s))  Aerobic Culture (superficial specimen)     Status: None (Preliminary result)   Collection Time: 01/20/18 10:58 PM  Result Value Ref Range Status   Specimen Description WOUND LEFT LEG  Final   Special Requests WOUND LEFT LEG  Final   Gram Stain   Final    FEW WBC PRESENT, PREDOMINANTLY PMN NO ORGANISMS SEEN    Culture   Final    FEW STAPHYLOCOCCUS AUREUS SUSCEPTIBILITIES TO FOLLOW Performed at [facility], [place] [address], [place], [place] [address]    Report Status PENDING  Incomplete     Radiology Studies: No results found.   Scheduled Meds: . acetaminophen  1,000 mg Oral TID  . atorvastatin  40 mg Oral Daily  . dexamethasone  4 mg Oral Q8H  . dronabinol  2.5 mg Oral BID AC  . DULoxetine  60 mg Oral Daily  . feeding supplement  1 Container Oral TID BM  . fluconazole  100 mg Oral Daily  .  fondaparinux (ARIXTRA) injection  7.5 mg Subcutaneous q1800  . gabapentin  400 mg Oral BID  . gabapentin  800 mg Oral QHS  . hydrochlorothiazide  25 mg Oral Daily  . hydrocortisone   Rectal BID  . hydrocortisone cream   Topical BID  . HYDROmorphone  10 mg Oral QHS  . irbesartan  150 mg Oral Daily  . lidocaine  1 patch Transdermal Q24H  . LORazepam  0.5 mg  Oral BID  . LORazepam  1 mg Oral QHS  . magnesium hydroxide  15 mL Oral Daily  . methadone  20 mg Oral Q8H  . methocarbamol  1,000 mg Oral QID  . metoprolol succinate  100 mg Oral QHS  . nutrition supplement (JUVEN)  1 packet Oral BID BM  . OLANZapine  5 mg Oral QHS  . pantoprazole  40 mg Oral Daily  . polyethylene glycol  17 g Oral Daily  . senna-docusate  2 tablet Oral BID  . sodium chloride flush  3 mL Intravenous Q12H  . sulfamethoxazole-trimethoprim  1 tablet Oral Q12H   Continuous Infusions: . sodium chloride       LOS: 36 days   Time spent: Total of 25 minutes spent with pt, greater than 50% of which was spent in discussion of  treatment, counseling and coordination of care  Tony Oman, MD Pager: Text Page via [email or web address]   If 7PM-7AM, please contact night-coverage [email or web address] 01/22/2018, 1:07 PM   Note - This record has been created using [employer]. Chart creation errors have been sought, but may not always have been located. Such creation errors do not reflect on the standard of medical care.

## 2018-01-22 NOTE — Progress Notes (Signed)
Nutrition Follow-up  DOCUMENTATION CODES:   Not applicable  INTERVENTION:    Boost Breeze po TID, each supplement provides 250 kcal and 9 grams of protein  Juven Fruit Punch BID, each serving provides 95kcal and 2.5g of protein (amino acids glutamine and arginine)  Obtain recent weight  NUTRITION DIAGNOSIS:   Increased nutrient needs related to cancer and cancer related treatments, wound healing as evidenced by estimated needs.  Ongoing  GOAL:   Patient will meet greater than or equal to 90% of their needs  Meeting   MONITOR:   PO intake, Supplement acceptance, Weight trends, Labs  REASON FOR ASSESSMENT:   LOS    ASSESSMENT:   Patient with PMH significant for HTN, IBS, and gastric ulcer. Presented to [facility] 7[facility]8 with right foot pain and discoloration. He was found to have left groin infection, PAD with right foot gangrene, and poorly differentiated adenocarcinoma of unclear primary- suspect lung cancer in right upper lobe. Was transferred to [facility] 7/21 to begin palliative radiation treatments for pain control.    7/22- started palliative radiation  No meal completions have been charted since 8/9. Spoke with pt who reports his appetite remains stable. He ate cereal with milk and a Boost breeze for breakfast. Pt continues to drink Juven BID. RD observed dark swelling around pt's chest and neck area likely related to rapid progression of lymphadenopathy per MD notes. Pt denies any issues swallowing with this. He would like to continue supplements.   RD placed order for recent wt to monitor trends.   Pt had a hard time with pain control this admission. He is likely to d/c home tomorrow.   Medications reviewed and include: decadron, marinol BID, methadone  Labs reviewed.   Diet Order:   Diet Order            Diet regular Room service appropriate? Yes; Fluid consistency: Thin  Diet effective now              EDUCATION NEEDS:   Education needs have been  addressed  Skin:  Skin Assessment: Skin Integrity Issues: Skin Integrity Issues:: Incisions, Other (Comment) Incisions: neck, left leg Other: toes on right foot demarcating   Last BM:  01/22/18  Height:   Ht Readings from Last 1 Encounters:  01/01/18 6\' 5"  (1.956 m)    Weight:   Wt Readings from Last 1 Encounters:  01/01/18 86.1 kg    Ideal Body Weight:  94.5 kg  BMI:  Body mass index is 22.51 kg/m.  Estimated Nutritional Needs:   Kcal:  2500-2700 kcal  Protein:  125-135 grams  Fluid:  >2.5 L/day   Mariana Single RD, LDN Clinical Nutrition Pager # - [phone]

## 2018-01-23 ENCOUNTER — Encounter: Payer: Self-pay | Admitting: Internal Medicine

## 2018-01-23 ENCOUNTER — Other Ambulatory Visit: Payer: Self-pay | Admitting: Internal Medicine

## 2018-01-23 ENCOUNTER — Ambulatory Visit
Admit: 2018-01-23 | Discharge: 2018-01-23 | Disposition: A | Discharge status: Home / Self Care | Payer: BLUE CROSS/BLUE SHIELD | Attending: Radiation Oncology | Admitting: Radiation Oncology

## 2018-01-23 ENCOUNTER — Telehealth: Payer: Self-pay | Admitting: Internal Medicine

## 2018-01-23 DIAGNOSIS — I82621 Acute embolism and thrombosis of deep veins of right upper extremity: Secondary | ICD-10-CM

## 2018-01-23 DIAGNOSIS — B3781 Candidal esophagitis: Secondary | ICD-10-CM

## 2018-01-23 DIAGNOSIS — B370 Candidal stomatitis: Secondary | ICD-10-CM

## 2018-01-23 LAB — BASIC METABOLIC PANEL
Anion gap: 11 (ref 5–15)
BUN: 26 mg/dL — AB (ref 6–20)
CHLORIDE: 102 mmol/L (ref 98–111)
CO2: 28 mmol/L (ref 22–32)
CREATININE: 0.79 mg/dL (ref 0.61–1.24)
Calcium: 9.4 mg/dL (ref 8.9–10.3)
GFR calc Af Amer: >60 mL/min (ref >60)
GFR calc non Af Amer: >60 mL/min (ref >60)
Glucose, Bld: 118 mg/dL — ABNORMAL HIGH (ref 70–99)
Potassium: 5.5 mmol/L — ABNORMAL HIGH (ref 3.5–5.1)
SODIUM: 141 mmol/L (ref 135–145)

## 2018-01-23 LAB — AEROBIC CULTURE  (SUPERFICIAL SPECIMEN)

## 2018-01-23 LAB — AEROBIC CULTURE W GRAM STAIN (SUPERFICIAL SPECIMEN)

## 2018-01-23 MED ORDER — OLANZAPINE 5 MG PO TABS: 5.0000 mg | ORAL_TABLET | Freq: Every day | ORAL | 0 refills | Status: DC

## 2018-01-23 MED ORDER — PANTOPRAZOLE SODIUM 40 MG PO TBEC: 40.0000 mg | DELAYED_RELEASE_TABLET | Freq: Every day | ORAL | 0 refills | Status: DC

## 2018-01-23 MED ORDER — METHADONE HCL 10 MG PO TABS: 20.0000 mg | ORAL_TABLET | Freq: Three times a day (TID) | ORAL | 0 refills | Status: DC

## 2018-01-23 MED ORDER — OMEPRAZOLE 40 MG PO CPDR: 40.0000 mg | DELAYED_RELEASE_CAPSULE | Freq: Every day | ORAL | 0 refills | Status: DC

## 2018-01-23 MED ORDER — ATORVASTATIN CALCIUM 40 MG PO TABS: 40.0000 mg | ORAL_TABLET | Freq: Every day | ORAL | 0 refills | Status: DC

## 2018-01-23 MED ORDER — FONDAPARINUX SODIUM 7.5 MG/0.6ML ~~LOC~~ SOLN
7.5000 mg | Freq: Every day | SUBCUTANEOUS | 0 refills | Status: DC
Start: 2018-01-23 — End: 2018-01-23

## 2018-01-23 MED ORDER — JUVEN PO PACK: 1.0000 | PACK | Freq: Two times a day (BID) | ORAL | 0 refills | Status: AC

## 2018-01-23 MED ORDER — DEXAMETHASONE 4 MG PO TABS: 4.0000 mg | ORAL_TABLET | Freq: Two times a day (BID) | ORAL | 0 refills | Status: DC

## 2018-01-23 MED ORDER — SENNOSIDES-DOCUSATE SODIUM 8.6-50 MG PO TABS: 2.0000 | ORAL_TABLET | Freq: Two times a day (BID) | ORAL | 0 refills | Status: AC

## 2018-01-23 MED ORDER — FONDAPARINUX SODIUM 7.5 MG/0.6ML ~~LOC~~ SOLN: 7.5000 mg | Freq: Every day | SUBCUTANEOUS | 0 refills | Status: DC

## 2018-01-23 MED ORDER — ONDANSETRON HCL 4 MG PO TABS: 4.0000 mg | ORAL_TABLET | Freq: Four times a day (QID) | ORAL | 0 refills | Status: DC | PRN

## 2018-01-23 MED ORDER — LORAZEPAM 1 MG PO TABS: ORAL_TABLET | ORAL | 0 refills | Status: DC

## 2018-01-23 MED ORDER — DRONABINOL 2.5 MG PO CAPS: 2.5000 mg | ORAL_CAPSULE | Freq: Two times a day (BID) | ORAL | 0 refills | Status: DC

## 2018-01-23 MED ORDER — NALOXONE HCL 4 MG/0.1ML NA LIQD: 1.0000 | Freq: Once | NASAL | 0 refills | Status: AC

## 2018-01-23 MED ORDER — FLUCONAZOLE 100 MG PO TABS: 100.0000 mg | ORAL_TABLET | Freq: Every day | ORAL | 0 refills | Status: DC

## 2018-01-23 MED ORDER — METHOCARBAMOL 750 MG PO TABS: 750.0000 mg | ORAL_TABLET | Freq: Three times a day (TID) | ORAL | 0 refills | Status: DC

## 2018-01-23 MED ORDER — HYDROMORPHONE HCL 8 MG PO TABS: 8.0000 mg | ORAL_TABLET | ORAL | 0 refills | Status: DC | PRN

## 2018-01-23 MED ORDER — TRIAMCINOLONE ACETONIDE 0.5 % EX OINT: 1.0000 "application " | TOPICAL_OINTMENT | Freq: Two times a day (BID) | CUTANEOUS | 0 refills | Status: AC

## 2018-01-23 MED ORDER — DULOXETINE HCL 60 MG PO CPEP: 60.0000 mg | ORAL_CAPSULE | Freq: Every day | ORAL | 0 refills | Status: DC

## 2018-01-23 MED ORDER — GABAPENTIN 400 MG PO CAPS: 400.0000 mg | ORAL_CAPSULE | Freq: Two times a day (BID) | ORAL | 0 refills | Status: DC

## 2018-01-23 MED ORDER — POVIDONE-IODINE 10 % EX SOLN: CUTANEOUS | 0 refills | Status: AC | PRN

## 2018-01-23 MED ORDER — POLYETHYLENE GLYCOL 3350 17 G PO PACK: 17.0000 g | PACK | Freq: Every day | ORAL | 0 refills | Status: AC

## 2018-01-23 MED ORDER — ACETAMINOPHEN 500 MG PO TABS: 1000.0000 mg | ORAL_TABLET | Freq: Three times a day (TID) | ORAL | 0 refills | Status: DC

## 2018-01-23 MED ORDER — SULFAMETHOXAZOLE-TRIMETHOPRIM 800-160 MG PO TABS: 1.0000 | ORAL_TABLET | Freq: Every day | ORAL | 0 refills | Status: DC

## 2018-01-23 NOTE — Discharge Instructions (Signed)
[facility] program for home based palliative care service including pain and symptom management, support through cancer treatment, enhance quality of life and help set goals of care.  Contact Number is above in follow-up appointments.

## 2018-01-23 NOTE — Progress Notes (Signed)
Follow up with [facility] and Dr. Hilma Favors. Dr. Irene Limbo will need to make referral to [facility]. Pt's wife states that she will call to make an appointment with Dr. Irene Limbo.

## 2018-01-23 NOTE — Plan of Care (Signed)
  Problem: Phase I Progression Outcomes Goal: Pain controlled with appropriate interventions Outcome: Progressing   Problem: Discharge Progression Outcomes Goal: Pain controlled with appropriate interventions Outcome: Progressing   Problem: Discharge Progression Outcomes Goal: Activity appropriate for discharge plan Outcome: Progressing

## 2018-01-23 NOTE — Progress Notes (Signed)
01/23/18  1456  Reviewed discharge instructions with patient and wife. Both verbalized understanding of discharge instructions. Copy of discharge instructions and prescriptions given to patient.

## 2018-01-23 NOTE — Discharge Summary (Signed)
Physician Discharge Summary  Tony [name]  OJJ:[id number]  DOB: Sep 14, 1965  DOA: 12/17/2017 PCP: [name], MD  Admit date: 12/17/2017 Discharge date: 01/23/2018  Admitted From: Home  Disposition: Home   Recommendations for Outpatient Follow-up:  1. Follow up with PCP in 1-2 weeks 2. Please obtain BMP/CBC in one week 3. Please follow up on the following pending results: 4. [facility] will follow him for palliative care services at home- they will help manage his methadone and ongoing symptom management 5. Wound Care - Apply gauze or foam dressing daily on left leg surgical scar. Cleanse necrotic toes on right foot with soap and water and paint with betadine daily.  6. 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  7. Taper steroids as able.  8. Infection prophylaxis given his lower extremity open wounds - would continue daily bactrim through chemotherapy. 9. Consider more detailed advance care planning and goals setting. 10. Needs an appointment with oncology for chemotherapy ASAP after discharge. 11. Follow-up with vascular surgery for SVC syndrome  Home Health: PT/RN   Discharge Condition: Stable  CODE STATUS: Full Code  Diet recommendation: Regular  Brief/Interim Summary: For full details see H&P/Progress note, but in brief, Tony [name] is a 52 year old male with medical history of PAD, status post femorofemoral bypass, thrombectomy and post wound infection.  Patient was admitted on 12/17/17 due to increase drainage from incisions from his left groin and left knee.  Patient underwent our to bifemoral bypass on 6/6 due to critical left limb ischemia.  Upon ED evaluation neck swelling was noted and CT of the neck showed s[iculated nodules in RUL with cervical lymphadenopathy and metastatic nodes in the neck and upper mediastinum this was concern for  lung CA.  Biopsy was done and was consistent with poorly differentiated adenocarcinoma.  Oncology was consulted and recommended radiotherapy.  During hospital stay was noted to have venous thrombosis of the right subclavian vein with SVC syndrome.  Hospital course has been complicated with difficult pain control, AKI and radiation dermatitis.  Subjective: Patient seen and examined, doing well today, feels that is ready for discharge. Has some pain after Rx therapy. However able to tolerate. No acute events overnight.   Discharge Diagnoses/Hospital Course:  Principal Problem:   Malignant neoplasm of upper lobe of right lung (HCC) Active Problems:   S/P aortobifemoral bypass surgery   Lung mass   Postoperative wound infection   Cervical lymphadenopathy   DVT (deep venous thrombosis) (HCC)   PAD (peripheral artery disease) (HCC)   Gangrene of right foot (HCC)   AKI (acute kidney injury) ([place])   Uncontrolled pain   Palliative care encounter   Neck pain due to malignant neoplastic disease ([place])   Palliative care by specialist   Mass in neck   Adenocarcinoma [facility])   Counseling regarding advance care planning and goals of care   Thrush of mouth and esophagus ([place])  Metastatic Poorly differentiated adenocarcinoma, lung primary Cervical mediastinal and axillary lymphadenopathy and SCV syndrome Prolonged hospital stay due to pain control and complication with SVC syndrome along with lymphadenopathy. Radiation oncology and vascular surgery were consulted. Currently receiving radiation, currently on 17/32  Swelling of the right arm has resolved.  Vascular surgery recommending to complete radiation therapy before considering endovascular approach for SVC. CT of neck showed progression of disease.  Medical oncology planning for chemotherapy in the next week or so.  DVT upper extremity Multiple places patient currently on Arixtras, per oncology recommendations.   Pain due to malignancy Pain  significantly improved. Current pain management working well is as follow: Methadone 20 mg TID,  oral Dilaudid 8 mg every 3 hours as needed.  Decadron to 4 mg every 12 hrs this will need to be taper off in the next week or so.  Cymbalta 60 mg daily, gabapentin 400 mg twice a day. Robaxin 750 mg TID. He has been schedule to follow with palliative care as outpatient for pain management  Right foot gangrene/Wound infection  Per vascular surgery planning for outpatient management Infectious process has resolved   Anxiety and bipolar disorder - Stable  On Zyprexa, Ativan and Cymbalta   Radiation dermatitis with cellulitis  Recurrent cellulitis patient completed treatement with cefazolin and Keflex.  However developed cellulitis again this could be a recurrent issue with radiation. Continue Bactrim while on chemo and radiation. Continue  hydrocortisone 1% twice a day.    Neck swelling related to rapid progression of lymphadenopathy - neck swelling decreasing. Difficulty with swallow has resolved after starting fluconazole CT neck shows rapid disease progression with increased right greater than left neck ancillary and mediastinal lymphadenopathy.  Severe right anterolateral edema and retropharyngeal effusion resulting in tracheal displacement, patent airway. Oncology planning for palliative chemo next week   Oral thrush  Continue Fluconazole to complete 14 days   Anemia of chronic disease Hemoglobin remains stable Transfused 1 unit of PRBC's during hospital stay   Severe constipation - resolved Continue bowel regimen  All other chronic medical condition were stable during the hospitalization.  Patient was seen by physical therapy, with no further recommendations  On the day of the discharge the patient's vitals were stable, and no other acute medical condition were reported by patient. the patient was felt safe to be discharge to home.   Discharge Instructions  You were cared for by a  hospitalist during your hospital stay. If you have any questions about your discharge medications or the care you received while you were in the hospital after you are discharged, you can call the unit and asked to speak with the hospitalist on call if the hospitalist that took care of you is not available. Once you are discharged, your primary care physician will handle any further medical issues. Please note that NO REFILLS for any discharge medications will be authorized once you are discharged, as it is imperative that you return to your primary care physician (or establish a relationship with a primary care physician if you do not have one) for your aftercare needs so that they can reassess your need for medications and monitor your lab values.  Discharge Instructions    Call MD for:  difficulty breathing, headache or visual disturbances   Complete by:  As directed    Call MD for:  extreme fatigue   Complete by:  As directed    Call MD for:  hives   Complete by:  As directed    Call MD for:  persistant dizziness or light-headedness   Complete by:  As directed    Call MD for:  persistant nausea and vomiting   Complete by:  As directed    Call MD for:  redness, tenderness, or signs of infection (pain, swelling, redness, odor or green/yellow discharge around incision site)   Complete by:  As directed    Call MD for:  severe uncontrolled pain   Complete by:  As directed  Call MD for:  temperature >100.4   Complete by:  As directed    Diet - low sodium heart healthy   Complete by:  As directed    Discharge wound care:   Complete by:  As directed    Apply Mepelex absorbent dressing to left lower leg as needed for drainage.  Clean right foot and toes with betadine daily.   Increase activity slowly   Complete by:  As directed      Allergies as of 01/23/2018      Reactions   Pork-derived Products    UNSPECIFIED REASON Pt doesn't eat pork   Shellfish-derived Products Other (See  Comments)   Doesn't want to eat      Medication List    STOP taking these medications   feeding supplement (PRO-STAT SUGAR FREE 64) Liqd   hydrochlorothiazide 25 MG tablet Commonly known as:  HYDRODIURIL   lactulose 10 GM/15ML solution Commonly known as:  CHRONULAC   meloxicam 15 MG tablet Commonly known as:  MOBIC   oxyCODONE 5 MG immediate release tablet Commonly known as:  Oxy IR/ROXICODONE   oxyCODONE-acetaminophen 5-325 MG tablet Commonly known as:  PERCOCET/ROXICET   Rivaroxaban 15 MG Tabs tablet Commonly known as:  XARELTO   rivaroxaban 20 MG Tabs tablet Commonly known as:  XARELTO   valsartan 160 MG tablet Commonly known as:  DIOVAN   vitamin C 500 MG tablet Commonly known as:  ASCORBIC ACID     TAKE these medications   acetaminophen 500 MG tablet Commonly known as:  TYLENOL Take 2 tablets (1,000 mg total) by mouth 3 (three) times daily.   atorvastatin 40 MG tablet Commonly known as:  LIPITOR Take 1 tablet (40 mg total) by mouth at bedtime. What changed:  when to take this   dexamethasone 4 MG tablet Commonly known as:  DECADRON Take 1 tablet (4 mg total) by mouth every 12 (twelve) hours.   dronabinol 2.5 MG capsule Commonly known as:  MARINOL Take 1 capsule (2.5 mg total) by mouth 2 (two) times daily before lunch and supper.   DULoxetine 60 MG capsule Commonly known as:  CYMBALTA Take 1 capsule (60 mg total) by mouth daily. Start taking on:  01/24/2018   fluconazole 100 MG tablet Commonly known as:  DIFLUCAN Take 1 tablet (100 mg total) by mouth daily. Start taking on:  01/24/2018   fondaparinux 7.5 MG/0.6ML Soln injection Commonly known as:  ARIXTRA Inject 0.6 mLs (7.5 mg total) into the skin daily at 6 PM.   gabapentin 400 MG capsule Commonly known as:  NEURONTIN Take 1 capsule (400 mg total) by mouth 2 (two) times daily. What changed:    medication strength  how much to take   HYDROmorphone 8 MG tablet Commonly known as:   DILAUDID Take 1 tablet (8 mg total) by mouth every 3 (three) hours as needed for up to 15 days for severe pain (breakthrough pain).   LORazepam 1 MG tablet Commonly known as:  ATIVAN Take 0.5 tablets (0.5 mg total) by mouth 2 (two) times daily AND 1 tablet (1 mg total) at bedtime. What changed:  See the new instructions.   methadone 10 MG tablet Commonly known as:  DOLOPHINE Take 2 tablets (20 mg total) by mouth every 8 (eight) hours.   methocarbamol 750 MG tablet Commonly known as:  ROBAXIN Take 1 tablet (750 mg total) by mouth 3 (three) times daily.   metoprolol succinate 100 MG 24 hr tablet Commonly known as:  TOPROL-XL Take 100 mg by mouth at bedtime. Take with or immediately following a meal.   naloxone 4 MG/0.1ML Liqd nasal spray kit Commonly known as:  NARCAN Place 1 spray into the nose once for 1 dose.   nutrition supplement (JUVEN) Pack Take 1 packet by mouth 2 (two) times daily between meals.   OLANZapine 5 MG tablet Commonly known as:  ZYPREXA Take 1 tablet (5 mg total) by mouth at bedtime.   omeprazole 40 MG capsule Commonly known as:  PRILOSEC Take 1 capsule (40 mg total) by mouth daily.   ondansetron 4 MG tablet Commonly known as:  ZOFRAN Take 1 tablet (4 mg total) by mouth every 6 (six) hours as needed for nausea or vomiting.   pantoprazole 40 MG tablet Commonly known as:  PROTONIX Take 1 tablet (40 mg total) by mouth daily. Start taking on:  01/24/2018   polyethylene glycol packet Commonly known as:  MIRALAX / GLYCOLAX Take 17 g by mouth daily. Start taking on:  01/24/2018 What changed:    when to take this  reasons to take this   povidone-iodine 10 % external solution Commonly known as:  BETADINE Apply topically as needed for wound care.   senna-docusate 8.6-50 MG tablet Commonly known as:  Senokot-S Take 2 tablets by mouth 2 (two) times daily. What changed:  when to take this   sulfamethoxazole-trimethoprim 800-160 MG tablet Commonly  known as:  BACTRIM DS,SEPTRA DS Take 1 tablet by mouth daily.   triamcinolone ointment 0.5 % Commonly known as:  KENALOG Apply 1 application topically 2 (two) times daily. Use at site of radiation burn            Discharge Care Instructions  (From admission, onward)         Start     Ordered   01/23/18 0000  Discharge wound care:    Comments:  Apply Mepelex absorbent dressing to left lower leg as needed for drainage.  Clean right foot and toes with betadine daily.   01/23/18 1342         Follow-up Information    Ale, Cloria Spring, MD. Schedule an appointment as soon as possible for a visit.   Specialties:  Hematology, Oncology Why:  Needs follow up in 3-5 days Contact information: [place] [address] [place] [facility] Follow up.   Contact information: Will make home visit -will call first [phone]       Tyler Pita, MD Follow up.   Specialty:  Radiation Oncology Why:  schedule has been provided Contact information: [place] [id number] [place]. Schedule an appointment as soon as possible for a visit.   Why:  As needed Contact information: [address] [place]       Koirala, Dibas, MD. Schedule an appointment as soon as possible for a visit in 1 week(s).   Specialty:  Family Medicine Why:  Hospital follow up  Contact information: [address] [place] [address] [phone]          Allergies  Allergen Reactions  . Pork-Derived Products     UNSPECIFIED REASON Pt doesn't eat pork  . Shellfish-Derived Products Other (See Comments)    Doesn't want to eat    Consultations:  Palliative care   Medical oncology   Radiation Oncology    Procedures/Studies:  Ct Soft Tissue Neck W Contrast  Result Date: 01/16/2018 CLINICAL DATA:  RIGHT neck pain  for 1 month after PICC line placement. New dysphagia and stridor. History of lung cancer and RIGHT neck venous thrombosis. EXAM: CT NECK WITH CONTRAST TECHNIQUE: Multidetector CT imaging of the neck was performed using the standard protocol following the bolus administration of intravenous contrast. CONTRAST:  29m OMNIPAQUE IOHEXOL 300 MG/ML  SOLN COMPARISON:  CT neck December 17, 2017. FINDINGS: PHARYNX AND LARYNX: Retropharyngeal effusion without rim enhancement. Normal pharynx and larynx. SALIVARY GLANDS: Normal, however mass effect due to lymphadenopathy. THYROID: Normal. LYMPH NODES: Worsening bulky RIGHT lymphadenopathy. For example, RIGHT level 3-5 3.1 x 4 cm lymph node was 1.6 x 2.1 cm. RIGHT level 3 lymph node was 1.3 x 1.8 cm, now 3.1 x 3.5 cm. Multiple new RIGHT neck lymph nodes at levels 2, 3, 4 and 7. Superior mediastinal nodal conglomeration was 3 x 4.9 cm, now 3.9 x 6.2 cm with mild mass effect on the trachea. Partially imaged bulky RIGHT axillary lymphadenopathy. LEFT supraclavicular 2.6 x 3.9 cm lymph node was 1.7 x 3.1 cm. VASCULAR: Central thrombus RIGHT mid internal jugular vein is unchanged, compressed or occluded towards the brachiocephalic confluence. LIMITED INTRACRANIAL: Normal. VISUALIZED ORBITS: Normal. MASTOIDS AND VISUALIZED PARANASAL SINUSES: RIGHT maxillary mucosal retention cysts without paranasal sinus air-fluid levels. Trace paranasal sinus mucosal thickening. Included mastoid air cells are well aerated. SKELETON: Nonacute. Severe LEFT temporomandibular osteoarthrosis. Poor dentition. 1 cm sclerotic lesion C6 is similar, potentially metastatic. UPPER CHEST: Lung apices are clear. No superior mediastinal lymphadenopathy. OTHER: Anterolateral RIGHT neck trans spatial edema with loss of RIGHT paraspinal muscle fat planes. Thickened RIGHT greater than LEFT platysma. Moderate to severe C5-6 degenerative disc. IMPRESSION: 1. Rapid disease progression: Increasing RIGHT greater than LEFT neck,  axillary and mediastinal lymphadenopathy. Severe RIGHT anterolateral transspatial edema and retropharyngeal effusion resulting in tracheal displacement. Patent airway. 2. 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 3. Acute findings discussed with and [facility] on 01/16/2018 at 4:28 pm. Electronically Signed   By: CElon AlasM.D.   On: 01/16/2018 16:30   Ct Chest W Contrast  Result Date: 01/07/2018 CLINICAL DATA:  Newly diagnosed stage IV cancer. Swelling in right chest. Swelling in neck. Question abscess of chest. Chest cellulitis. EXAM: CT CHEST WITH CONTRAST TECHNIQUE: Multidetector CT imaging of the chest was performed during intravenous contrast administration. CONTRAST:  733mOMNIPAQUE IOHEXOL 300 MG/ML  SOLN COMPARISON:  CT of the chest December 24, 2017 FINDINGS: Cardiovascular: The thoracic aorta is nonaneurysmal with no dissection. No coronary artery calcifications. The heart is normal. The main pulmonary artery is normal in caliber. The right hilar adenopathy exerts mass effect on the upper lobe pulmonary artery on the right without obvious occlusion on limited visualization. No definitive pulmonary emboli. Mediastinum/Nodes: Bulky adenopathy is seen in the base of neck bilaterally. A representative node in the left base of neck on series 2, image 9 measures 2.2 cm today versus 2.0 cm previously. Adenopathy in the right base of neck persists. The representative node to the right of the trachea on series 2, image 22 measures 1.9 cm on both studies. The adenopathy in the right neck may be bulkier but is difficult to compare due to difference in slice selection. There is deviation of the larynx to the left which is slightly more pronounced without airway narrowing. A right paratracheal node in the upper mediastinum measures 4 cm today versus 3.6 cm previously. Adenopathy involves the  subcarinal region, the precarinal region, the prevascular space, and the  right hilum. No effusions. The esophagus is normal. The central SVC remains patent. There is poor opacification of the right subclavian vein, right brachiocephalic vein, and right internal jugular vein, consistent with known DVT seen on previous imaging. Significant edema in the chest wall diffusely. Skin thickening. No abscess identified in the chest wall. Bulky adenopathy again noted in the right axilla more prominent the interval. A representative node in the right axilla on series 2, image 66 measures 1.5 cm today versus 1.2 cm previously. Bulky adenopathy in the right retropectoral region appears more prominent as well. No axillary adenopathy on the left. No adenopathy in the left retropectoral region. Lungs/Pleura: The spiculated nodule in the right upper lobe measures 1.4 by 2.4 by 1.3 cm today, unchanged in the interval. Minimal ground-glass in the left apex may represent minimal infectious or inflammatory change. No other nodules or masses. No other changes. Mild paraseptal emphysematous changes in the apices. Central airways are normal. No pneumothorax. Upper Abdomen: No acute abnormality. Musculoskeletal: No chest wall abnormality. No acute or significant osseous findings. IMPRESSION: 1. The known spiculated mass in the right upper lobe is stable in size. Bulky adenopathy in the cervical lymph node chains, right greater than left; the mediastinum; the right axilla; the right retropectoral region; and the right hilum is overall worsened in the interval. 2. Poor enhancement in the right internal jugular, brachiocephalic, and subclavian veins consistent with previously reported DVT in his locations. 3. Severe edema in the chest wall with no abscess identified. 4. Mass effect on the right upper lobe pulmonary artery due to right hilar adenopathy without complete occlusion. No obvious DVT on limited images. Emphysema (ICD10-J43.9). Electronically Signed   By: Dorise Bullion III M.D   On: 01/07/2018 13:52    Ct Chest W Contrast  Result Date: 12/25/2017 CLINICAL DATA:  Spiculated lung mass and adenopathy shown on neck CT, for further workup. EXAM: CT CHEST, ABDOMEN, AND PELVIS WITH CONTRAST TECHNIQUE: Multidetector CT imaging of the chest, abdomen and pelvis was performed following the standard protocol during bolus administration of intravenous contrast. CONTRAST:  138m OMNIPAQUE IOHEXOL 300 MG/ML  SOLN COMPARISON:  12/17/2017 and CT abdomen from 08/14/2011 there is some mild fatty infiltration in segment IV adjacent to the falciform ligament. Otherwise unremarkable. FINDINGS: CT CHEST FINDINGS Cardiovascular: Aortic arch and branch vessel atherosclerotic vascular disease. Poor visualization of contrast in the right internal jugular vein, compatible with patient's known venous thrombosis. Mediastinum/Nodes: Bilateral lower neck level IV adenopathy with ill definition. A left level Roman numeral 4 lymph node measures 2.0 cm in short axis on image 2/3. There is also bilateral supraclavicular adenopathy including a 1.9 cm right-sided supraclavicular node on image 7/3. Bulky paratracheal, prevascular, subcarinal, and right hilar adenopathy noted. An index right paratracheal node measures 3.6 cm in short axis on image 16/3. Enlarged right subpectoral lymph nodes are present along with scattered right axillary lymph nodes. Mild stranding in the anterior mediastinum. Lungs/Pleura: Moderate right and small left pleural effusion are present and are nonspecific for transudative versus exudative etiology. A spiculated right upper lobe nodule measures 2.4 by 1.3 by 1.4 cm, roughly stable from 12/17/2017. There is passive atelectasis in both lower lobes, right greater than left. Paraseptal emphysema noted. Musculoskeletal: Notable subcutaneous edema throughout the chest, somewhat confluent along the right upper chest. CT ABDOMEN PELVIS FINDINGS Hepatobiliary: Unremarkable Pancreas: Unremarkable Spleen: Unremarkable  Adrenals/Urinary Tract: Unremarkable Stomach/Bowel: Air-fluid levels in the rectum indicating  diarrheal process. Borderline wall thickening several segments of proximal small bowel. Vascular/Lymphatic: Aortoiliac atherosclerotic vascular disease. Aortobifemoral graft small fluid collection superficial to the right groin likely representing residual hematoma from prior groin puncture, but only measuring about 2.4 by 2.1 by 4.3 cm (volume = 11 cm^3). No gas in this collection. Left inguinal lymph node 0.9 cm in short axis. Reproductive: Unremarkable Other: Diffuse subcutaneous edema. Small amount of pelvic ascites. Gas tracking along the anterior abdominal wall subcutaneous tissues, correlate with any anterior abdominal wall injections. Subcutaneous edema extends along the pubis and towards the penis. Musculoskeletal: Unremarkable IMPRESSION: 1. Bulky adenopathy in the neck and upper chest. Persistent 2.4 cm spiculated right upper lobe nodule. The degree of adenopathy seems disproportionate to the size of the nodule, and this could represent adenopathy related to lung cancer or potentially 2 separate processes such as lymphoproliferative disease and a separate primary lung cancer. An infectious cause for the pulmonary nodule is less likely. 2. Extensive third spacing of fluid in the chest, abdomen and pelvis. Query hypoproteinemia or hypoalbuminemia. 3. Poor visualization of contrast in the right internal jugular vein compatible with the patient's known venous thrombosis. 4. Air fluid levels in the rectum indicating diarrheal process. Borderline wall thickening in the proximal small bowel, query proximal enteritis. 5. The small fluid collection superficial to the right groin puncture site is only about 11 cc in volume, and probably reflects a resolving hematoma. 6. Gas tracking along anterior abdominal wall subcutaneous tissues, probably from injections-correlate with any anterior abdominal wall subcutaneous  injections recently. Electronically Signed   By: Van Clines M.D.   On: 12/25/2017 00:05   Mr Jeri Cos WG Contrast  Result Date: 12/25/2017 CLINICAL DATA:  52 year old male with right upper extremity and right central venous DVT with bulky cervical and thoracic lymphadenopathy and spiculated right apical lung nodule. EXAM: MRI HEAD WITHOUT AND WITH CONTRAST TECHNIQUE: Multiplanar, multiecho pulse sequences of the brain and surrounding structures were obtained without and with intravenous contrast. CONTRAST:  12m MULTIHANCE GADOBENATE DIMEGLUMINE 529 MG/ML IV SOLN COMPARISON:  Neck CT 12/17/2017. Head CT without contrast 08/03/2006. Neck and chest CT FINDINGS: Brain: No midline shift, mass effect, or evidence of intracranial mass lesion. No abnormal enhancement identified. No dural thickening. No restricted diffusion to suggest acute infarction. No ventriculomegaly, extra-axial collection or acute intracranial hemorrhage. Cervicomedullary junction and pituitary are within normal limits. GPearline Cablesand white matter signal is within normal limits for age throughout the brain; Minimal nonspecific left frontal lobe white matter T2 and FLAIR hyperintensity (series 6, image 19). No cortical encephalomalacia or chronic cerebral blood products. Vascular: Major intracranial vascular flow voids are preserved. The major dural venous sinuses are enhancing and appear patent. The right IJ bulb at the skull base is patent. Skull and upper cervical spine: Negative visible cervical spine and spinal cord. Visualized bone marrow signal is within normal limits. Sinuses/Orbits: Negative orbits soft tissues. Paranasal sinuses and mastoids are stable and well pneumatized. Other: Visible internal auditory structures appear normal. Partially visible bulky right neck lymphadenopathy (series 4, image 14). IMPRESSION: 1. No cerebral metastatic disease or acute intracranial abnormality identified. 2. Right cervical lymphadenopathy  redemonstrated. Electronically Signed   By: HGenevie AnnM.D.   On: 12/25/2017 09:17   Ct Abdomen Pelvis W Contrast  Result Date: 12/25/2017 CLINICAL DATA:  Spiculated lung mass and adenopathy shown on neck CT, for further workup. EXAM: CT CHEST, ABDOMEN, AND PELVIS WITH CONTRAST TECHNIQUE: Multidetector CT imaging of the chest, abdomen and pelvis  was performed following the standard protocol during bolus administration of intravenous contrast. CONTRAST:  133m OMNIPAQUE IOHEXOL 300 MG/ML  SOLN COMPARISON:  12/17/2017 and CT abdomen from 08/14/2011 there is some mild fatty infiltration in segment IV adjacent to the falciform ligament. Otherwise unremarkable. FINDINGS: CT CHEST FINDINGS Cardiovascular: Aortic arch and branch vessel atherosclerotic vascular disease. Poor visualization of contrast in the right internal jugular vein, compatible with patient's known venous thrombosis. Mediastinum/Nodes: Bilateral lower neck level IV adenopathy with ill definition. A left level Roman numeral 4 lymph node measures 2.0 cm in short axis on image 2/3. There is also bilateral supraclavicular adenopathy including a 1.9 cm right-sided supraclavicular node on image 7/3. Bulky paratracheal, prevascular, subcarinal, and right hilar adenopathy noted. An index right paratracheal node measures 3.6 cm in short axis on image 16/3. Enlarged right subpectoral lymph nodes are present along with scattered right axillary lymph nodes. Mild stranding in the anterior mediastinum. Lungs/Pleura: Moderate right and small left pleural effusion are present and are nonspecific for transudative versus exudative etiology. A spiculated right upper lobe nodule measures 2.4 by 1.3 by 1.4 cm, roughly stable from 12/17/2017. There is passive atelectasis in both lower lobes, right greater than left. Paraseptal emphysema noted. Musculoskeletal: Notable subcutaneous edema throughout the chest, somewhat confluent along the right upper chest. CT ABDOMEN PELVIS  FINDINGS Hepatobiliary: Unremarkable Pancreas: Unremarkable Spleen: Unremarkable Adrenals/Urinary Tract: Unremarkable Stomach/Bowel: Air-fluid levels in the rectum indicating diarrheal process. Borderline wall thickening several segments of proximal small bowel. Vascular/Lymphatic: Aortoiliac atherosclerotic vascular disease. Aortobifemoral graft small fluid collection superficial to the right groin likely representing residual hematoma from prior groin puncture, but only measuring about 2.4 by 2.1 by 4.3 cm (volume = 11 cm^3). No gas in this collection. Left inguinal lymph node 0.9 cm in short axis. Reproductive: Unremarkable Other: Diffuse subcutaneous edema. Small amount of pelvic ascites. Gas tracking along the anterior abdominal wall subcutaneous tissues, correlate with any anterior abdominal wall injections. Subcutaneous edema extends along the pubis and towards the penis. Musculoskeletal: Unremarkable IMPRESSION: 1. Bulky adenopathy in the neck and upper chest. Persistent 2.4 cm spiculated right upper lobe nodule. The degree of adenopathy seems disproportionate to the size of the nodule, and this could represent adenopathy related to lung cancer or potentially 2 separate processes such as lymphoproliferative disease and a separate primary lung cancer. An infectious cause for the pulmonary nodule is less likely. 2. Extensive third spacing of fluid in the chest, abdomen and pelvis. Query hypoproteinemia or hypoalbuminemia. 3. Poor visualization of contrast in the right internal jugular vein compatible with the patient's known venous thrombosis. 4. Air fluid levels in the rectum indicating diarrheal process. Borderline wall thickening in the proximal small bowel, query proximal enteritis. 5. The small fluid collection superficial to the right groin puncture site is only about 11 cc in volume, and probably reflects a resolving hematoma. 6. Gas tracking along anterior abdominal wall subcutaneous tissues, probably  from injections-correlate with any anterior abdominal wall subcutaneous injections recently. Electronically Signed   By: WVan ClinesM.D.   On: 12/25/2017 00:05   Dg UDuanne LimerickW/small Bowel  Result Date: 12/28/2017 CLINICAL DATA:  Metastatic adenocarcinoma of unknown primary. EXAM: UPPER GI SERIES WITH SMALL BOWEL FOLLOW-THROUGH FLUOROSCOPY TIME:  Fluoroscopy Time:  3 minutes 6 seconds TECHNIQUE: Combined double contrast and single contrast upper GI series using effervescent crystals, thick barium, and thin barium. Subsequently, serial images of the small bowel were obtained including spot views of the terminal ileum. COMPARISON:  CT scan dated  12/24/2017 FINDINGS: The esophagus appears normal. The fundus, body, and antrum of the stomach are normal. Pylorus is normal. There is slight edema of the mucosa of the duodenal bulb but the bulb expands and contracts without a mass. The jejunum and ileum appear normal including the terminal ileum. Small bowel transit time was 50 minutes, normal. IMPRESSION: Slight edema of the mucosa of the duodenal bulb. Otherwise normal upper GI and small-bowel follow-through. No visible mass lesions in the stomach or small bowel. Electronically Signed   By: Lorriane Shire M.D.   On: 12/28/2017 09:47     Discharge Exam: Vitals:   01/23/18 1042 01/23/18 1322  BP: (!) 155/92 130/80  Pulse: (!) 105 100  Resp: 16 18  Temp:  99.2 F (37.3 C)  SpO2: 98% 96%   Vitals:   01/22/18 2048 01/23/18 0410 01/23/18 1042 01/23/18 1322  BP: 130/85 138/84 (!) 155/92 130/80  Pulse: (!) 101 89 (!) 105 100  Resp: '12 12 16 18  '$ Temp: 98.4 F (36.9 C) 98.6 F (37 C)  99.2 F (37.3 C)  TempSrc: Oral Oral  Oral  SpO2: 97% 100% 98% 96%  Weight:      Height:        General: NAD Cardiovascular: RRR, S1/S2 Respiratory: CTA bilaterally  The results of significant diagnostics from this hospitalization (including imaging, microbiology, ancillary and laboratory) are listed below for  reference.     Microbiology: Recent Results (from the past 240 hour(s))  Aerobic Culture (superficial specimen)     Status: None   Collection Time: 01/20/18 10:58 PM  Result Value Ref Range Status   Specimen Description WOUND LEFT LEG  Final   Special Requests WOUND LEFT LEG  Final   Gram Stain   Final    FEW WBC PRESENT, PREDOMINANTLY PMN NO ORGANISMS SEEN Performed at [facility], [address] [place], [place] [address]    Culture FEW STAPHYLOCOCCUS AUREUS  Final   Report Status 01/23/2018 FINAL  Final   Organism ID, Bacteria STAPHYLOCOCCUS AUREUS  Final      Susceptibility   Staphylococcus aureus - MIC*    CIPROFLOXACIN <=0.5 SENSITIVE Sensitive     ERYTHROMYCIN <=0.25 SENSITIVE Sensitive     GENTAMICIN <=0.5 SENSITIVE Sensitive     OXACILLIN <=0.25 SENSITIVE Sensitive     TETRACYCLINE <=1 SENSITIVE Sensitive     VANCOMYCIN 1 SENSITIVE Sensitive     TRIMETH/SULFA <=10 SENSITIVE Sensitive     CLINDAMYCIN <=0.25 SENSITIVE Sensitive     RIFAMPIN <=0.5 SENSITIVE Sensitive     Inducible Clindamycin NEGATIVE Sensitive     * FEW STAPHYLOCOCCUS AUREUS     Labs: BNP (last 3 results) No results for input(s): BNP in the last 8760 hours. Basic Metabolic Panel: Recent Labs  Lab 01/18/18 0800 01/21/18 0730 01/22/18 0922 01/23/18 0816  NA 141 141 138 141  K 4.7 4.0 4.4 5.5*  CL 102 103 103 102  CO2 '29 27 23 28  '$ GLUCOSE 130* 126* 188* 118*  BUN 21* 21* 23* 26*  CREATININE 0.75 0.68 0.73 0.79  CALCIUM 9.1 9.2 9.3 9.4  MG  --   --  1.7  --    Liver Function Tests: No results for input(s): AST, ALT, ALKPHOS, BILITOT, PROT, ALBUMIN in the last 168 hours. No results for input(s): LIPASE, AMYLASE in the last 168 hours. No results for input(s): AMMONIA in the last 168 hours. CBC: Recent Labs  Lab 01/17/18 0435 01/18/18 0800 01/21/18 0730  WBC 10.2  10.8* 9.1  HGB 7.5* 9.1* 9.1*  HCT 23.7* 29.1* 29.2*  MCV 90.8 91.2 91.5  PLT 330 379 406*   Cardiac  Enzymes: No results for input(s): CKTOTAL, CKMB, CKMBINDEX, TROPONINI in the last 168 hours. BNP: Invalid input(s): POCBNP CBG: No results for input(s): GLUCAP in the last 168 hours. D-Dimer No results for input(s): DDIMER in the last 72 hours. Hgb A1c No results for input(s): HGBA1C in the last 72 hours. Lipid Profile No results for input(s): CHOL, HDL, LDLCALC, TRIG, CHOLHDL, LDLDIRECT in the last 72 hours. Thyroid function studies No results for input(s): TSH, T4TOTAL, T3FREE, THYROIDAB in the last 72 hours.  Invalid input(s): FREET3 Anemia work up No results for input(s): VITAMINB12, FOLATE, FERRITIN, TIBC, IRON, RETICCTPCT in the last 72 hours. Urinalysis    Component Value Date/Time   COLORURINE YELLOW 12/17/2017 2049   APPEARANCEUR CLEAR 12/17/2017 2049   LABSPEC >1.046 (H) 12/17/2017 2049   PHURINE 6.0 12/17/2017 2049   GLUCOSEU NEGATIVE 12/17/2017 2049   HGBUR NEGATIVE 12/17/2017 2049   HGBUR negative 07/26/2009 [place] NEGATIVE 12/17/2017 2049   KETONESUR 20 (A) 12/17/2017 2049   PROTEINUR NEGATIVE 12/17/2017 2049   UROBILINOGEN 0.2 08/21/2011 1911   NITRITE NEGATIVE 12/17/2017 2049   LEUKOCYTESUR NEGATIVE 12/17/2017 2049   Sepsis Labs Invalid input(s): PROCALCITONIN,  WBC,  LACTICIDVEN Microbiology Recent Results (from the past 240 hour(s))  Aerobic Culture (superficial specimen)     Status: None   Collection Time: 01/20/18 10:58 PM  Result Value Ref Range Status   Specimen Description WOUND LEFT LEG  Final   Special Requests WOUND LEFT LEG  Final   Gram Stain   Final    FEW WBC PRESENT, PREDOMINANTLY PMN NO ORGANISMS SEEN Performed at [facility], [address], [place], [place] [address]    Culture FEW STAPHYLOCOCCUS AUREUS  Final   Report Status 01/23/2018 FINAL  Final   Organism ID, Bacteria STAPHYLOCOCCUS AUREUS  Final      Susceptibility   Staphylococcus aureus - MIC*    CIPROFLOXACIN <=0.5 SENSITIVE Sensitive      ERYTHROMYCIN <=0.25 SENSITIVE Sensitive     GENTAMICIN <=0.5 SENSITIVE Sensitive     OXACILLIN <=0.25 SENSITIVE Sensitive     TETRACYCLINE <=1 SENSITIVE Sensitive     VANCOMYCIN 1 SENSITIVE Sensitive     TRIMETH/SULFA <=10 SENSITIVE Sensitive     CLINDAMYCIN <=0.25 SENSITIVE Sensitive     RIFAMPIN <=0.5 SENSITIVE Sensitive     Inducible Clindamycin NEGATIVE Sensitive     * FEW STAPHYLOCOCCUS AUREUS     Time coordinating discharge: 35 minutes  SIGNED:  Chipper Oman, MD  [facility] 01/23/2018, 2:10 PM  Pager please text page via  [email or web address]  Note - This record has been created using [employer]. Chart creation errors have been sought, but may not always have been located. Such creation errors do not reflect on the standard of medical care.

## 2018-01-23 NOTE — Progress Notes (Signed)
Patient to be followed after discharge by community based palliative care. [facility] notified and case discussed with Dr. Estill Cotta, medical director.

## 2018-01-23 NOTE — Telephone Encounter (Signed)
Post-Acute/Transition of Care issues  [facility] did not have Arixtra in stock. His next dose was due at [facility]. I offered to call this in to another pharmacy but after discussion with patient will wait for [facility] and he will give his dose as soon as it is available.Instructed them to not take an additional dose tomorrow at [facility], but to modify dose time once the next dose is given to every 24 hours.  Will need ambulatory referral confirmation for [facility]- have discussed this case with Dr. Irene Limbo including complex symptom management and the need for in home support by [facility] for methadone titration and other PC needs. He will need follow-up appoint as soon as possible with Dr. Irene Limbo after discharge to begin systemic treatment as planned.  [name], DO Palliative Medicine

## 2018-01-23 NOTE — Progress Notes (Signed)
Resubmitted

## 2018-01-23 NOTE — Progress Notes (Signed)
Palliative Care Progress Note  Patient getting ready to go down to radiation. He looks awake alert and tells me he had a much better night-he feels rested and is needing much less medication in general. He is anxious to start chemotherapy. He has anticipatory anxiety about going home and about the future but tells me he will be ready in AM as planned for discharge. He has things he "wants to do" and feels like he "isnt living in the hospital" -he believes his pain is controlled well enough for him to go home with the right support.  Comprehensive Discharge Plan:  Tony [name] is at high risk for readmission and also for complications related to chemotherapy. He needs complex pain management, psycho-social-spiritual support and to begin systemic therapy as soon as possible given rapid progression seen on his last CT scan. 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 He remains independent in all of his ADLs.   [facility] will follow him for palliative care services at home- they will help manage his methadone and ongoing symptom management- I have discussed his care plan in detail with their medical director and also with Dr. Kale,oncology.   Wound Care- will get [place] nurse consult prior to discharge for home recommendations and patient education for drg changes and wound care. Palliative RNs will assist with this plan at home and if needed request [place].   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    Infection prophylaxis given his lower extremity open wounds- would continue daily bactrim through chemotherapy.   Titrated dose of methadone is now at 20mg  PO TID, hydromorphone for breakthrough pain PRN- recommend narcan to be in the home for safety and in considering his goals of care.   Consider more  detailed advance care planning and goals setting. Need to consider the "what if scenario" - I am concerned about the impact of his tracheal shift from adenopathy and his tumor growth - if he declined from a respiratory standpoint he would be a difficult intubation and be at high risk for injury and would likely not survive. He has in prior conversations with our providers not been able to engage in detailed conversation on this topic.    Needs an appointment with oncology for chemotherapy ASAP after discharge.  I will assist with discharge prescriptions and in other needs tomorrow.  [name], DO Palliative Medicine [phone]

## 2018-01-23 NOTE — Progress Notes (Signed)
Methadone not available at [facility]- [facility] [address] per [facility] request.

## 2018-01-24 ENCOUNTER — Other Ambulatory Visit: Payer: Self-pay | Admitting: Radiation Oncology

## 2018-01-24 ENCOUNTER — Other Ambulatory Visit: Payer: Self-pay | Admitting: Internal Medicine

## 2018-01-24 ENCOUNTER — Ambulatory Visit
Admission: RE | Admit: 2018-01-24 | Discharge: 2018-01-24 | Disposition: A | Discharge status: Home / Self Care | Payer: BLUE CROSS/BLUE SHIELD | Source: Ambulatory Visit | Attending: Radiation Oncology | Admitting: Radiation Oncology

## 2018-01-24 DIAGNOSIS — C3412 Malignant neoplasm of upper lobe, left bronchus or lung: Secondary | ICD-10-CM | POA: Diagnosis not present

## 2018-01-24 DIAGNOSIS — C770 Secondary and unspecified malignant neoplasm of lymph nodes of head, face and neck: Secondary | ICD-10-CM | POA: Diagnosis not present

## 2018-01-24 DIAGNOSIS — C3490 Malignant neoplasm of unspecified part of unspecified bronchus or lung: Secondary | ICD-10-CM

## 2018-01-24 DIAGNOSIS — Z510 Encounter for antineoplastic radiation therapy: Secondary | ICD-10-CM | POA: Diagnosis not present

## 2018-01-25 ENCOUNTER — Ambulatory Visit
Admission: RE | Admit: 2018-01-25 | Discharge: 2018-01-25 | Disposition: A | Discharge status: Home / Self Care | Payer: BLUE CROSS/BLUE SHIELD | Source: Ambulatory Visit | Attending: Radiation Oncology | Admitting: Radiation Oncology

## 2018-01-25 ENCOUNTER — Telehealth: Payer: Self-pay | Admitting: Hematology

## 2018-01-25 DIAGNOSIS — C770 Secondary and unspecified malignant neoplasm of lymph nodes of head, face and neck: Secondary | ICD-10-CM | POA: Diagnosis not present

## 2018-01-25 NOTE — Telephone Encounter (Signed)
Lft the ot a vm to reschedule a hospital follow up with Dr. Irene Limbo.

## 2018-01-27 ENCOUNTER — Ambulatory Visit: Admission: RE | Admit: 2018-01-27 | Payer: BLUE CROSS/BLUE SHIELD | Source: Ambulatory Visit

## 2018-01-28 ENCOUNTER — Telehealth: Payer: Self-pay | Admitting: Hematology

## 2018-01-28 ENCOUNTER — Ambulatory Visit
Admission: RE | Admit: 2018-01-28 | Discharge: 2018-01-28 | Disposition: A | Discharge status: Home / Self Care | Payer: BLUE CROSS/BLUE SHIELD | Source: Ambulatory Visit | Attending: Radiation Oncology | Admitting: Radiation Oncology

## 2018-01-28 DIAGNOSIS — C770 Secondary and unspecified malignant neoplasm of lymph nodes of head, face and neck: Secondary | ICD-10-CM | POA: Diagnosis not present

## 2018-01-28 NOTE — Telephone Encounter (Signed)
Patients wife cane into scheduling needed to r/s hi hospital f/u appointment. Talked with Tony [name] and she will discuss with Dr Irene Limbo and call patient's wife wife.  Patient was offered appt today per Seth Bake but was unable to stay due to being very drowsy.

## 2018-01-28 NOTE — Telephone Encounter (Signed)
Attempted to reschedule the pt's hospital follow up appt today. I lft a vm for him to return my call.

## 2018-01-29 ENCOUNTER — Telehealth: Payer: Self-pay | Admitting: Hematology

## 2018-01-29 ENCOUNTER — Ambulatory Visit
Admission: RE | Admit: 2018-01-29 | Discharge: 2018-01-29 | Disposition: A | Discharge status: Home / Self Care | Payer: BLUE CROSS/BLUE SHIELD | Source: Ambulatory Visit | Attending: Radiation Oncology | Admitting: Radiation Oncology

## 2018-01-29 DIAGNOSIS — C770 Secondary and unspecified malignant neoplasm of lymph nodes of head, face and neck: Secondary | ICD-10-CM | POA: Diagnosis not present

## 2018-01-29 NOTE — Telephone Encounter (Signed)
Patient presented back to scheduling today in need of a hospital f/u appt.  Patients wife stated she never heard from anyone regarding f/u appt.  I called and spoke with Seth Bake and she added him in for 8/21 @ 12:20

## 2018-01-29 NOTE — Telephone Encounter (Signed)
Pt's hospital follow up has been rescheduled for him to see Dr. Irene Limbo on 8/21 at 1220pm. Per pt's wife, the pt is exhausted when he has radiation treatment and would need something before.

## 2018-01-30 ENCOUNTER — Ambulatory Visit (INDEPENDENT_AMBULATORY_CARE_PROVIDER_SITE_OTHER): Payer: BLUE CROSS/BLUE SHIELD | Admitting: Vascular Surgery

## 2018-01-30 ENCOUNTER — Ambulatory Visit
Admission: RE | Admit: 2018-01-30 | Discharge: 2018-01-30 | Disposition: A | Discharge status: Home / Self Care | Payer: BLUE CROSS/BLUE SHIELD | Source: Ambulatory Visit | Attending: Radiation Oncology | Admitting: Radiation Oncology

## 2018-01-30 ENCOUNTER — Other Ambulatory Visit: Payer: Self-pay

## 2018-01-30 ENCOUNTER — Encounter: Payer: Self-pay | Admitting: Vascular Surgery

## 2018-01-30 ENCOUNTER — Encounter: Payer: Self-pay | Admitting: Hematology

## 2018-01-30 ENCOUNTER — Inpatient Hospital Stay: Payer: BLUE CROSS/BLUE SHIELD | Attending: Hematology | Admitting: Hematology

## 2018-01-30 VITALS — BP 145/91 | HR 111 | Temp 98.2°F | Resp 18 | Ht 72.0 in | Wt 164.0 lb

## 2018-01-30 VITALS — BP 147/98 | HR 106 | Temp 98.3°F | Resp 18 | Ht 77.0 in | Wt 164.1 lb

## 2018-01-30 DIAGNOSIS — G893 Neoplasm related pain (acute) (chronic): Secondary | ICD-10-CM | POA: Insufficient documentation

## 2018-01-30 DIAGNOSIS — Z79899 Other long term (current) drug therapy: Secondary | ICD-10-CM | POA: Diagnosis not present

## 2018-01-30 DIAGNOSIS — D630 Anemia in neoplastic disease: Secondary | ICD-10-CM | POA: Insufficient documentation

## 2018-01-30 DIAGNOSIS — Z5111 Encounter for antineoplastic chemotherapy: Secondary | ICD-10-CM | POA: Insufficient documentation

## 2018-01-30 DIAGNOSIS — I871 Compression of vein: Secondary | ICD-10-CM | POA: Diagnosis not present

## 2018-01-30 DIAGNOSIS — I82890 Acute embolism and thrombosis of other specified veins: Secondary | ICD-10-CM | POA: Diagnosis not present

## 2018-01-30 DIAGNOSIS — C3492 Malignant neoplasm of unspecified part of left bronchus or lung: Secondary | ICD-10-CM | POA: Diagnosis present

## 2018-01-30 DIAGNOSIS — C799 Secondary malignant neoplasm of unspecified site: Secondary | ICD-10-CM | POA: Diagnosis not present

## 2018-01-30 DIAGNOSIS — Z87891 Personal history of nicotine dependence: Secondary | ICD-10-CM

## 2018-01-30 DIAGNOSIS — Z7189 Other specified counseling: Secondary | ICD-10-CM

## 2018-01-30 DIAGNOSIS — C770 Secondary and unspecified malignant neoplasm of lymph nodes of head, face and neck: Secondary | ICD-10-CM | POA: Diagnosis not present

## 2018-01-30 DIAGNOSIS — Z48812 Encounter for surgical aftercare following surgery on the circulatory system: Secondary | ICD-10-CM

## 2018-01-30 NOTE — Progress Notes (Signed)
Vitals:   01/30/18 1523  BP: (!) 152/96  Pulse: (!) 112  Resp: 18  Temp: 98.2 F (36.8 C)  TempSrc: Oral  SpO2: 99%  Weight: 164 lb (74.4 kg)  Height: 6' (1.829 m)

## 2018-01-30 NOTE — Progress Notes (Signed)
HEMATOLOGY/ONCOLOGY CONSULTATION NOTE  Date of Service: 01/30/2018  Patient Care Team: [name], MD as PCP - General (Family Medicine) Brunetta Genera, MD as Medical Oncologist (Hematology)  CHIEF COMPLAINTS/PURPOSE OF CONSULTATION:  Metastatic Lung Adenocarcinoma  HISTORY OF PRESENTING ILLNESS:  Tony [name] is a wonderful 52 y.o. male who has been referred to [place] by Dr. Durenda Hurt for evaluation, management and followup for newly diagnosed metastatic poorly differentiated adenocarcinoma likely of lung primary. Upper GI series showed no overt esophageal or gastric tumor . Has been seen by radiation oncology and was transferred to [facility] long and today started on palliative radiation for pain control and for impending SVC syndrome due to bulky disease in his chest. He has completed antibiotics for his left groin infection and the wound incisions of healed. Still having significant right upper extremity swelling.  Has extensive bilateral cervical and mediastinal lymphadenopathy. Continues to be on anticoagulation with fondaparinux for his DVT. We discussed the lab studies imaging findings and the role for palliative chemotherapy in the setting.  He is agreeable to this and would like to pursue an aggressive course of treatment understanding that it might not be curative. We discussed starting him on weekly carboplatin + Taxol while on concurrent RT. Pending foundation one and PDL1 testing results   Interval History:   Tony [name] returns today for management and evaluation of his Metastatic Lung Adenocarcinoma. I last saw the pt as an inpatient on 01/18/18. He is accompanied today by his wife. The pt reports that he is doing well overall.   The pt reports that he is feeling very tired but notes that Methodone is controlling his pain very well at this time. This is being managed by outpatient palliative care team and Dr Hilma Favors.  He notes that his neck has developed a  new feeling of  fullness/growth just under his right ear. He adds that a previously known lump has decreased in size, on the right side of his neck just above his shoulder. He denies any fevers or chills.   The pt's wife notes that he is not eating as well as he used to because he has lost his taste. He endorses having some pain when he swallows on his left side. He has begun supplementing his meals with Boost.   Lab results (01/21/18) of CBC and BMP is as follows: all values are WNL except for RBC at 3.19, HGB at 9.1, HCT at 29.2, PLT at 406k, Glucose at 188, BUN at 23.  On review of systems, pt reports eating as best he can, feeling tired, new mass under right ear, and denies hand swelling, leg swelling, and any other symptoms.    MEDICAL HISTORY:  Past Medical History:  Diagnosis Date  . Anxiety   . Arterial occlusive disease    multilevel arterial occlusive disease/notes 11/13/2017  . Arthritis    "left knee" (11/13/2017)  . Chronic back pain   . Chronic lower back pain   . Depression   . Gastric ulcer due to Helicobacter pylori 6720  . GERD (gastroesophageal reflux disease)   . High cholesterol   . Hypertension   . IBS (irritable bowel syndrome)   . Mesenteric adenitis 2011   presumed/H&P  . Migraines    "use to suffer migraines years ago" (11/13/2017)  . Peripheral vascular disease ([place])   . Pneumonia 2011  . Ulcer     SURGICAL HISTORY: Past Surgical History:  Procedure Laterality Date  . AORTA -  BILATERAL FEMORAL ARTERY BYPASS GRAFT Bilateral 11/15/2017   Procedure: AORTA BIFEMORAL BYPASS GRAFT;  Surgeon: Angelia Mould, MD;  Location: [place];  Service: Vascular;  Laterality: Bilateral;  . BACK SURGERY  X 4   "procedure where they burn the nerves every 6 months"  . EMBOLECTOMY Left 12/01/2017   Procedure: THROMBECTOMY OF LEFT AORTAFEMORAL BYPASS, THROMBECTOMY OF TIBIAL ARTERY;  Surgeon: Rosetta Posner, MD;  Location[facility] OR;  Service: Vascular;  Laterality: Left;  .  ESOPHAGOGASTRODUODENOSCOPY  08/22/2011   Procedure: ESOPHAGOGASTRODUODENOSCOPY (EGD);  Surgeon: [name], MD;  Location: [place];  Service: Gastroenterology;  Laterality: N/A;  . FEMORAL-POPLITEAL BYPASS GRAFT Right 11/15/2017   Procedure: Right FEMORAL-Above knee POPLITEAL ARTERY Bypass Graft using nonreversed saphenous vein ;  Surgeon: Angelia Mould, MD;  Location: [place];  Service: Vascular;  Laterality: Right;  . FEMORAL-POPLITEAL BYPASS GRAFT Left 12/01/2017   Procedure: BYPASS GRAFT FEMORAL- ABOVE KNEE POPLITEAL ARTERY WITH VEIN;  Surgeon: Rosetta Posner, MD;  Location: [place];  Service: Vascular;  Laterality: Left;  . INTRAOPERATIVE ARTERIOGRAM Right 11/15/2017   Procedure: INTRA OPERATIVE ARTERIOGRAM;  Surgeon: Angelia Mould, MD;  Location: [place];  Service: Vascular;  Laterality: Right;  . INTRAOPERATIVE ARTERIOGRAM Left 12/01/2017   Procedure: INTRA OPERATIVE ARTERIOGRAM OF LEFT LEG;  Surgeon: Rosetta Posner, MD;  Location: [place];  Service: Vascular;  Laterality: Left;  . IR US GUIDE BX ASP/DRAIN  12/18/2017  . KNEE ARTHROSCOPY Left X 5    SOCIAL HISTORY: Social History   Socioeconomic History  . Marital status: Married    Spouse name: Not on file  . Number of children: 3  . Years of education: Not on file  . Highest education level: Not on file  Occupational History  . Occupation: Admin. Assistant    Employer: [employer]  Social Needs  . Financial resource strain: Not on file  . Food insecurity:    Worry: Not on file    Inability: Not on file  . Transportation needs:    Medical: Not on file    Non-medical: Not on file  Tobacco Use  . Smoking status: Former Smoker    Packs/day: 1.00    Years: 24.00    Pack years: 24.00    Types: Cigarettes    Start date: 11/28/2017    Last attempt to quit: 12/04/2017    Years since quitting: 0.1  . Smokeless tobacco: Never Used  Substance and Sexual Activity  . Alcohol use: Yes    Comment: 11/13/2017 "did drink  wine q  1-2 months; nothing anymore"   . Drug use: Not Currently    Types: Marijuana    Comment: 11/13/2017  "last drug use was in ~ 1987 "  . Sexual activity: Not on file  Lifestyle  . Physical activity:    Days per week: Not on file    Minutes per session: Not on file  . Stress: Not on file  Relationships  . Social connections:    Talks on phone: Not on file    Gets together: Not on file    Attends religious service: Not on file    Active member of club or organization: Not on file    Attends meetings of clubs or organizations: Not on file    Relationship status: Not on file  . Intimate partner violence:    Fear of current or ex partner: Not on file    Emotionally abused: Not on file  Physically abused: Not on file    Forced sexual activity: Not on file  Other Topics Concern  . Not on file  Social History Narrative   Lives with wife and two children.  Administrator at [employer]: Family History  Problem Relation Age of Onset  . Hypertension Mother   . Hypertension Father   . Coronary artery disease Father 67  . Heart attack Brother 75  . Coronary artery disease Brother   . Diabetes Neg Hx   . Stroke Neg Hx     ALLERGIES:  is allergic to pork-derived products and shellfish-derived products.  MEDICATIONS:  Current Outpatient Medications  Medication Sig Dispense Refill  . acetaminophen (TYLENOL) 500 MG tablet Take 2 tablets (1,000 mg total) by mouth 3 (three) times daily. 30 tablet 0  . atorvastatin (LIPITOR) 40 MG tablet Take 1 tablet (40 mg total) by mouth at bedtime. 30 tablet 0  . dexamethasone (DECADRON) 4 MG tablet Take 1 tablet (4 mg total) by mouth every 12 (twelve) hours. 60 tablet 0  . dronabinol (MARINOL) 2.5 MG capsule Take 1 capsule (2.5 mg total) by mouth 2 (two) times daily before lunch and supper. 60 capsule 0  . DULoxetine (CYMBALTA) 60 MG capsule Take 1 capsule (60 mg total) by mouth daily. 30 capsule 0  . fluconazole (DIFLUCAN) 100 MG  tablet Take 1 tablet (100 mg total) by mouth daily. 30 tablet 0  . fondaparinux (ARIXTRA) 7.5 MG/0.6ML SOLN injection Inject 0.6 mLs (7.5 mg total) into the skin daily at 6 PM for 26 days. 15.6 mL 0  . gabapentin (NEURONTIN) 400 MG capsule Take 1 capsule (400 mg total) by mouth 2 (two) times daily. 60 capsule 0  . HYDROmorphone (DILAUDID) 8 MG tablet Take 1 tablet (8 mg total) by mouth every 3 (three) hours as needed for up to 15 days for severe pain (breakthrough pain). 120 tablet 0  . LORazepam (ATIVAN) 1 MG tablet Take 0.5 tablets (0.5 mg total) by mouth 2 (two) times daily AND 1 tablet (1 mg total) at bedtime. 60 tablet 0  . methadone (DOLOPHINE) 10 MG tablet Take 2 tablets (20 mg total) by mouth every 8 (eight) hours. 180 tablet 0  . methocarbamol (ROBAXIN) 750 MG tablet Take 1 tablet (750 mg total) by mouth 3 (three) times daily. 90 tablet 0  . metoprolol succinate (TOPROL-XL) 100 MG 24 hr tablet Take 100 mg by mouth at bedtime. Take with or immediately following a meal.    . nutrition supplement, JUVEN, (JUVEN) PACK Take 1 packet by mouth 2 (two) times daily between meals. 60 packet 0  . OLANZapine (ZYPREXA) 5 MG tablet Take 1 tablet (5 mg total) by mouth at bedtime. 30 tablet 0  . omeprazole (PRILOSEC) 40 MG capsule Take 1 capsule (40 mg total) by mouth daily. 30 capsule 0  . ondansetron (ZOFRAN) 4 MG tablet Take 1 tablet (4 mg total) by mouth every 6 (six) hours as needed for nausea or vomiting. 20 tablet 0  . pantoprazole (PROTONIX) 40 MG tablet Take 1 tablet (40 mg total) by mouth daily. 30 tablet 0  . polyethylene glycol (MIRALAX / GLYCOLAX) packet Take 17 g by mouth daily. 14 each 0  . povidone-iodine (BETADINE) 10 % external solution Apply topically as needed for wound care. 480 mL 0  . senna-docusate (SENOKOT-S) 8.6-50 MG tablet Take 2 tablets by mouth 2 (two) times daily. 30 tablet 0  . sulfamethoxazole-trimethoprim (BACTRIM DS,SEPTRA DS) 800-160 MG tablet  Take 1 tablet by mouth  daily. 30 tablet 0  . triamcinolone ointment (KENALOG) 0.5 % Apply 1 application topically 2 (two) times daily. Use at site of radiation burn 60 g 0   No current facility-administered medications for this visit.     REVIEW OF SYSTEMS:    10 Point review of Systems was done is negative except as noted above.  PHYSICAL EXAMINATION: ECOG PERFORMANCE STATUS: 2 - Symptomatic, <50% confined to bed  . Vitals:   01/30/18 1245  BP: (!) 147/98  Pulse: (!) 106  Resp: 18  Temp: 98.3 F (36.8 C)  SpO2: 100%   Filed Weights   01/30/18 1245  Weight: 164 lb 1.6 oz (74.4 kg)   .Body mass index is 19.46 kg/m.  GENERAL:alert, in no acute distress and comfortable SKIN: improved erythema over neck, supraclavicular and chest wall with decreased skin edema EYES: conjunctiva are pink and non-injected, sclera anicteric OROPHARYNX: MMM, no exudates, no oropharyngeal erythema or ulceration NECK: b/l cervical LNadenopathy R>>L improved, improved erythema over neck, supraclavicular area and chest wall with decreased skin edema LYMPH:  palpable b/l cervical lymphadenopathy LUNGS: clear to auscultation b/l with normal respiratory effort HEART: regular rate & rhythm ABDOMEN:  normoactive bowel sounds , non tender, not distended. No hepatosplenomegaly.  Extremity: Much reduced swelling in RUE, minimal LUE swelling. No pedal edema. Rt foot gangrenous changes. Rt groin wound healed with no discharge at this time PSYCH: alert & oriented x 3 with fluent speech NEURO: no focal motor/sensory deficits  LABORATORY DATA:  I have reviewed the data as listed  . CBC Latest Ref Rng & Units 01/21/2018 01/18/2018 01/17/2018  WBC 4.0 - 10.5 K/uL 9.1 10.8(H) 10.2  Hemoglobin 13.0 - 17.0 g/dL 9.1(L) 9.1(L) 7.5(L)  Hematocrit 39.0 - 52.0 % 29.2(L) 29.1(L) 23.7(L)  Platelets 150 - 400 K/uL 406(H) 379 330    . CMP Latest Ref Rng & Units 01/23/2018 01/22/2018 01/21/2018  Glucose 70 - 99 mg/dL 118(H) 188(H) 126(H)  BUN 6 -  20 mg/dL 26(H) 23(H) 21(H)  Creatinine 0.61 - 1.24 mg/dL 0.79 0.73 0.68  Sodium 135 - 145 mmol/L 141 138 141  Potassium 3.5 - 5.1 mmol/L 5.5(H) 4.4 4.0  Chloride 98 - 111 mmol/L 102 103 103  CO2 22 - 32 mmol/L '28 23 27  '$ Calcium 8.9 - 10.3 mg/dL 9.4 9.3 9.2  Total Protein 6.5 - 8.1 g/dL - - -  Total Bilirubin 0.3 - 1.2 mg/dL - - -  Alkaline Phos 38 - 126 U/L - - -  AST 15 - 41 U/L - - -  ALT 0 - 44 U/L - - -   12/18/17 Soft tissue biopsy:    12/18/17 [facility] Results:      RADIOGRAPHIC STUDIES: I have personally reviewed the radiological images as listed and agreed with the findings in the report. Ct Soft Tissue Neck W Contrast  Result Date: 01/16/2018 CLINICAL DATA:  RIGHT neck pain for 1 month after PICC line placement. New dysphagia and stridor. History of lung cancer and RIGHT neck venous thrombosis. EXAM: CT NECK WITH CONTRAST TECHNIQUE: Multidetector CT imaging of the neck was performed using the standard protocol following the bolus administration of intravenous contrast. CONTRAST:  45m OMNIPAQUE IOHEXOL 300 MG/ML  SOLN COMPARISON:  CT neck December 17, 2017. FINDINGS: PHARYNX AND LARYNX: Retropharyngeal effusion without rim enhancement. Normal pharynx and larynx. SALIVARY GLANDS: Normal, however mass effect due to lymphadenopathy. THYROID: Normal. LYMPH NODES: Worsening bulky RIGHT lymphadenopathy. For example, RIGHT level  3-5 3.1 x 4 cm lymph node was 1.6 x 2.1 cm. RIGHT level 3 lymph node was 1.3 x 1.8 cm, now 3.1 x 3.5 cm. Multiple new RIGHT neck lymph nodes at levels 2, 3, 4 and 7. Superior mediastinal nodal conglomeration was 3 x 4.9 cm, now 3.9 x 6.2 cm with mild mass effect on the trachea. Partially imaged bulky RIGHT axillary lymphadenopathy. LEFT supraclavicular 2.6 x 3.9 cm lymph node was 1.7 x 3.1 cm. VASCULAR: Central thrombus RIGHT mid internal jugular vein is unchanged, compressed or occluded towards the brachiocephalic confluence. LIMITED INTRACRANIAL: Normal. VISUALIZED  ORBITS: Normal. MASTOIDS AND VISUALIZED PARANASAL SINUSES: RIGHT maxillary mucosal retention cysts without paranasal sinus air-fluid levels. Trace paranasal sinus mucosal thickening. Included mastoid air cells are well aerated. SKELETON: Nonacute. Severe LEFT temporomandibular osteoarthrosis. Poor dentition. 1 cm sclerotic lesion C6 is similar, potentially metastatic. UPPER CHEST: Lung apices are clear. No superior mediastinal lymphadenopathy. OTHER: Anterolateral RIGHT neck trans spatial edema with loss of RIGHT paraspinal muscle fat planes. Thickened RIGHT greater than LEFT platysma. Moderate to severe C5-6 degenerative disc. IMPRESSION: 1. Rapid disease progression: Increasing RIGHT greater than LEFT neck, axillary and mediastinal lymphadenopathy. Severe RIGHT anterolateral transspatial edema and retropharyngeal effusion resulting in tracheal displacement. Patent airway. 2. 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 3. Acute findings discussed with and [facility] by [facility] on 01/16/2018 at 4:28 pm. Electronically Signed   By: Elon Alas M.D.   On: 01/16/2018 16:30   Ct Chest W Contrast  Result Date: 01/07/2018 CLINICAL DATA:  Newly diagnosed stage IV cancer. Swelling in right chest. Swelling in neck. Question abscess of chest. Chest cellulitis. EXAM: CT CHEST WITH CONTRAST TECHNIQUE: Multidetector CT imaging of the chest was performed during intravenous contrast administration. CONTRAST:  3m OMNIPAQUE IOHEXOL 300 MG/ML  SOLN COMPARISON:  CT of the chest December 24, 2017 FINDINGS: Cardiovascular: The thoracic aorta is nonaneurysmal with no dissection. No coronary artery calcifications. The heart is normal. The main pulmonary artery is normal in caliber. The right hilar adenopathy exerts mass effect on the upper lobe pulmonary artery on the right without obvious occlusion on limited visualization. No definitive pulmonary emboli. Mediastinum/Nodes: Bulky adenopathy is  seen in the base of neck bilaterally. A representative node in the left base of neck on series 2, image 9 measures 2.2 cm today versus 2.0 cm previously. Adenopathy in the right base of neck persists. The representative node to the right of the trachea on series 2, image 22 measures 1.9 cm on both studies. The adenopathy in the right neck may be bulkier but is difficult to compare due to difference in slice selection. There is deviation of the larynx to the left which is slightly more pronounced without airway narrowing. A right paratracheal node in the upper mediastinum measures 4 cm today versus 3.6 cm previously. Adenopathy involves the subcarinal region, the precarinal region, the prevascular space, and the right hilum. No effusions. The esophagus is normal. The central SVC remains patent. There is poor opacification of the right subclavian vein, right brachiocephalic vein, and right internal jugular vein, consistent with known DVT seen on previous imaging. Significant edema in the chest wall diffusely. Skin thickening. No abscess identified in the chest wall. Bulky adenopathy again noted in the right axilla more prominent the interval. A representative node in the right axilla on series 2, image 66 measures 1.5 cm today versus 1.2 cm previously. Bulky adenopathy in the right retropectoral region appears more prominent as well. No axillary adenopathy  on the left. No adenopathy in the left retropectoral region. Lungs/Pleura: The spiculated nodule in the right upper lobe measures 1.4 by 2.4 by 1.3 cm today, unchanged in the interval. Minimal ground-glass in the left apex may represent minimal infectious or inflammatory change. No other nodules or masses. No other changes. Mild paraseptal emphysematous changes in the apices. Central airways are normal. No pneumothorax. Upper Abdomen: No acute abnormality. Musculoskeletal: No chest wall abnormality. No acute or significant osseous findings. IMPRESSION: 1. The known  spiculated mass in the right upper lobe is stable in size. Bulky adenopathy in the cervical lymph node chains, right greater than left; the mediastinum; the right axilla; the right retropectoral region; and the right hilum is overall worsened in the interval. 2. Poor enhancement in the right internal jugular, brachiocephalic, and subclavian veins consistent with previously reported DVT in his locations. 3. Severe edema in the chest wall with no abscess identified. 4. Mass effect on the right upper lobe pulmonary artery due to right hilar adenopathy without complete occlusion. No obvious DVT on limited images. Emphysema (ICD10-J43.9). Electronically Signed   By: Dorise Bullion III M.D   On: 01/07/2018 13:52    ASSESSMENT & PLAN:  52 y.o. male with  1. Metastatic lung adenocarcinoma with Right upper lobe noduleand extensive mediastinal and cervical lymphadenopathy -Biopsy consistent with poorly differentiated adenocarcinoma likely lung ( no imaging evidence of primary tumor other than lung cancer) [facility] One results reviewed - no targetable lung cancer mutation.(EGFR, ROS 1 BRAF and ALK gene rearrangement neg) CT  abd no overt disease. MRI brain neg for metastatic disease UGI series- no evidence of UGI malignancy  2. Metastatic cancer related hypercoagulable status   3. ExtensiveDeep venous thrombosis involving the right subclavian vein, right brachiocephalic vein, and right internal jugular vein- due to cancer + vascular surgery + surgical site infection. -improved RUE swelling.  4. Rt proximal upper venous compression by bulky mediastinal tumor with impending SVC syndrome.  5. PAD with recent vascular bypass and rt foot gangrene. Rt grion infection -resolved s/p antibiotics.  6. Cancer related pain --- mx by palliative care  7. Neck and rt upper chest wall erythema and induration -- likely primarily due to radiation related skin toxicity. Being treated empirically for  cellulitis with cefazolin. BL cx x 2 neg to date.  8. Left groin would infection post-op - resolved  9. Anemia due to metastatic cancer, blood loss from recent surgery. Plan -transfuse prn for hgb <8 to tolerate tx and for wound healing   PLAN: -palliative care following for ongoing pain mx - pain better controlled on a methadone based regimen. -clinically the patient notes improved neck pain and swelling, significant improvement in RUE swelling, improved ROM of neck and no new breathing issues or chest pain. -Discussed the intentions, possible benefits, and possible side effects of initiating palliative chemotherapy with the pt and his family and he will continue to consider this -Discussed pt labwork from 01/21/18; blood counts and chemistries are stable  -Discussed that the intent of chemotherapy is palliative and not curative  -Again discussed the patient's goals of care, pt prefers to begin chemotherapy  -Discussed beginning a dose reduced, weekly Carboplatin + Taxol treatment concurrently with RT as the pt has newly developed palpable mass under right ear -Continue following palliative care Dr. [name] for pain management -conitnue Fondaparinux for anticoagulation. -Will set the pt up for chemotherapy counseling -Will see the pt back on C1D1 of Carboplatin Taxol  Referral to chemo-counseling for weekly Carboplatin + Taxol Start Carboplatin + Taxol ASAP latest by 02/04/2018 with labs RTC with Dr Irene Limbo with labs 1 week after C1D1 of ctx for toxicity check with labs with 2nd weekly dose   All of the patients questions were answered with apparent satisfaction. The patient knows to call the clinic with any problems, questions or concerns.  The total time spent in the appt was 40 minutes and more than 50% was on counseling and direct patient cares.    Sullivan Lone MD MS AAHIVMS [facility] Hematology/Oncology Physician [facility]  (Office):        [phone] (Work cell):  [phone] (Fax):           [phone]  01/30/2018 2:17 PM  I, Baldwin [place], am acting as a scribe for Dr. Irene Limbo  .I have reviewed the above documentation for accuracy and completeness, and I agree with the above. Brunetta Genera MD

## 2018-01-30 NOTE — Progress Notes (Signed)
Patient name: Tony [name] MRN: [id number] DOB: 1965-10-16 Sex: male  REASON FOR VISIT:   Follow-up after aortofemoral bypass graft and left femoropopliteal bypass.  HPI:   Tony [name] is a pleasant 52 y.o. male he underwent an aortobifemoral bypass graft and a right femoral to above-knee popliteal artery bypass with a vein graft on 11/15/2017.  On 12/01/2017 he occluded the left limb of his graft and underwent thrombectomy of the left limb and a left femoral to above-knee popliteal artery bypass with vein graft.  No technical problems were found.  He was subsequently found to have a malignancy and it was felt that he likely had problems related to his hypercoagulable state.  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  He is being treated for his malignancy with radiation therapy.  His only complaint has been some lymphatic drainage from the below the knee incision on the left.  His activity is fairly limited and I do not get any clear-cut history of claudication.  He does have some tingling in his toes where the toes on the left side are auto amputating.   Current Outpatient Medications  Medication Sig Dispense Refill  . acetaminophen (TYLENOL) 500 MG tablet Take 2 tablets (1,000 mg total) by mouth 3 (three) times daily. 30 tablet 0  . atorvastatin (LIPITOR) 40 MG tablet Take 1 tablet (40 mg total) by mouth at bedtime. 30 tablet 0  . dexamethasone (DECADRON) 4 MG tablet Take 1 tablet (4 mg total) by mouth every 12 (twelve) hours. 60 tablet 0  . dronabinol (MARINOL) 2.5 MG capsule Take 1 capsule (2.5 mg total) by mouth 2 (two) times daily before lunch and supper. 60 capsule 0  . DULoxetine (CYMBALTA) 60 MG capsule Take 1 capsule (60 mg total) by mouth daily. 30 capsule 0  . fluconazole (DIFLUCAN) 100 MG tablet Take 1 tablet (100 mg total) by mouth daily. 30 tablet 0  . fondaparinux (ARIXTRA) 7.5 MG/0.6ML SOLN  injection Inject 0.6 mLs (7.5 mg total) into the skin daily at 6 PM for 26 days. 15.6 mL 0  . gabapentin (NEURONTIN) 400 MG capsule Take 1 capsule (400 mg total) by mouth 2 (two) times daily. 60 capsule 0  . HYDROmorphone (DILAUDID) 8 MG tablet Take 1 tablet (8 mg total) by mouth every 3 (three) hours as needed for up to 15 days for severe pain (breakthrough pain). 120 tablet 0  . LORazepam (ATIVAN) 1 MG tablet Take 0.5 tablets (0.5 mg total) by mouth 2 (two) times daily AND 1 tablet (1 mg total) at bedtime. 60 tablet 0  . methadone (DOLOPHINE) 10 MG tablet Take 2 tablets (20 mg total) by mouth every 8 (eight) hours. 180 tablet 0  . methocarbamol (ROBAXIN) 750 MG tablet Take 1 tablet (750 mg total) by mouth 3 (three) times daily. 90 tablet 0  . metoprolol succinate (TOPROL-XL) 100 MG 24 hr tablet Take 100 mg by mouth at bedtime. Take with or immediately following a meal.    . nutrition supplement, JUVEN, (JUVEN) PACK Take 1 packet by mouth 2 (two) times daily between meals. 60 packet 0  . OLANZapine (ZYPREXA) 5 MG tablet Take 1 tablet (5 mg total) by mouth at bedtime. 30 tablet 0  . omeprazole (PRILOSEC) 40 MG capsule Take 1 capsule (40 mg total) by mouth daily. 30 capsule 0  . ondansetron (ZOFRAN) 4 MG tablet Take 1 tablet (4 mg total)  by mouth every 6 (six) hours as needed for nausea or vomiting. 20 tablet 0  . pantoprazole (PROTONIX) 40 MG tablet Take 1 tablet (40 mg total) by mouth daily. 30 tablet 0  . polyethylene glycol (MIRALAX / GLYCOLAX) packet Take 17 g by mouth daily. 14 each 0  . povidone-iodine (BETADINE) 10 % external solution Apply topically as needed for wound care. 480 mL 0  . senna-docusate (SENOKOT-S) 8.6-50 MG tablet Take 2 tablets by mouth 2 (two) times daily. 30 tablet 0  . sulfamethoxazole-trimethoprim (BACTRIM DS,SEPTRA DS) 800-160 MG tablet Take 1 tablet by mouth daily. 30 tablet 0  . triamcinolone ointment (KENALOG) 0.5 % Apply 1 application topically 2 (two) times daily.  Use at site of radiation burn 60 g 0   No current facility-administered medications for this visit.     REVIEW OF SYSTEMS:  [X]  denotes positive finding, [ ]  denotes negative finding Vascular    Leg swelling    Cardiac    Chest pain or chest pressure:    Shortness of breath upon exertion:    Short of breath when lying flat:    Irregular heart rhythm:    Constitutional    Fever or chills:     PHYSICAL EXAM:   Vitals:   01/30/18 1523 01/30/18 1526  BP: (!) 152/96 (!) 145/91  Pulse: (!) 112 (!) 111  Resp: 18   Temp: 98.2 F (36.8 C)   TempSrc: Oral   SpO2: 99%   Weight: 164 lb (74.4 kg)   Height: 6' (1.829 m)     GENERAL: The patient is a well-nourished male, in no acute distress. The vital signs are documented above. CARDIOVASCULAR: There is a regular rate and rhythm. PULMONARY: There is good air exchange bilaterally without wheezing or rales. VASCULAR: He has palpable femoral pulses. The toes on the right foot are demarcating. He does have a small lymphocele in his below the knee incision on the left and I aspirated under sterile conditions using 1% lidocaine 9 cc of clear fluid.  DATA:   No new data  MEDICAL ISSUES:   STATUS POST AORTO FEMORAL BYPASS GRAFT AND BILATERAL FEMOROPOPLITEAL BYPASS: His feet are well-perfused in his toes and the right foot are auto amputating.  From a vascular standpoint I think he is doing well.  He is being treated for his malignancy.  I will see him back in 3 months.  I ordered graft duplex scans and ABIs at that time.  He knows to call sooner if he has problems.  We have previously discussed amputating the toes of the right foot however he would prefer to continue with conservative treatment.  I think this is reasonable given that there are dry with no significant drainage or erythema.  Deitra Mayo [facility] [phone]

## 2018-01-31 ENCOUNTER — Other Ambulatory Visit: Payer: Self-pay | Admitting: Internal Medicine

## 2018-01-31 ENCOUNTER — Ambulatory Visit: Payer: BLUE CROSS/BLUE SHIELD

## 2018-01-31 MED ORDER — PB-HYOSCY-ATROPINE-SCOPOLAMINE 16.2 MG/5ML PO ELIX: 10.0000 mL | ORAL_SOLUTION | Freq: Three times a day (TID) | ORAL | 0 refills | Status: AC | PRN

## 2018-01-31 MED ORDER — LIDOCAINE VISCOUS HCL 2 % MT SOLN: 15.0000 mL | OROMUCOSAL | 0 refills | Status: AC | PRN

## 2018-01-31 NOTE — Progress Notes (Unsigned)
Patient called complaining of severe throat pain, odynophagia. Radiation induced esophagitis/mucositis, on anti-fungal prophylaxis. Called in Donnatal and Viscous Lidocaine. Will have community based team follow up on symptoms and nutrition.  [name], DO Palliative Medicine

## 2018-02-01 ENCOUNTER — Telehealth: Payer: Self-pay | Admitting: *Deleted

## 2018-02-01 ENCOUNTER — Telehealth: Payer: Self-pay

## 2018-02-01 ENCOUNTER — Ambulatory Visit
Admission: RE | Admit: 2018-02-01 | Discharge: 2018-02-01 | Disposition: A | Discharge status: Home / Self Care | Payer: BLUE CROSS/BLUE SHIELD | Source: Ambulatory Visit | Attending: Radiation Oncology | Admitting: Radiation Oncology

## 2018-02-01 DIAGNOSIS — C770 Secondary and unspecified malignant neoplasm of lymph nodes of head, face and neck: Secondary | ICD-10-CM | POA: Diagnosis not present

## 2018-02-01 NOTE — Telephone Encounter (Signed)
TC from [place], [place] with Palliative Care team ([place]). She is requesting a w/c be ordered for pt d/t weakness and fatigue. Pt is undergoing XRT and also s/p fem-pop bypass with some gangrene of toes left foot.

## 2018-02-01 NOTE — Telephone Encounter (Signed)
Printed a calender of upcoming appointments per 8/22 los. Patient will pick up a copy today at 1pm

## 2018-02-02 ENCOUNTER — Other Ambulatory Visit: Payer: Self-pay | Admitting: Internal Medicine

## 2018-02-02 MED ORDER — HYDROMORPHONE HCL 8 MG PO TABS: 8.0000 mg | ORAL_TABLET | ORAL | 0 refills | Status: DC | PRN

## 2018-02-02 NOTE — Progress Notes (Signed)
Refilled Hydromorphone. Cancer related pain. Followed by [facility]). Cancer related pain.

## 2018-02-03 ENCOUNTER — Other Ambulatory Visit: Payer: Self-pay | Admitting: Internal Medicine

## 2018-02-03 MED ORDER — HYDROMORPHONE HCL 8 MG PO TABS: 8.0000 mg | ORAL_TABLET | ORAL | 0 refills | Status: DC | PRN

## 2018-02-03 MED ORDER — HYDROMORPHONE HCL 8 MG PO TABS: 8.0000 mg | ORAL_TABLET | ORAL | 0 refills | Status: AC | PRN

## 2018-02-03 NOTE — Progress Notes (Signed)
Pharmacy would not fill new hydromorphone prescription until 31st-patient is out of medication. New scrpit sent for q2 prn. Disp 168 for 2 week supply.

## 2018-02-04 ENCOUNTER — Ambulatory Visit
Admission: RE | Admit: 2018-02-04 | Discharge: 2018-02-04 | Disposition: A | Discharge status: Home / Self Care | Payer: BLUE CROSS/BLUE SHIELD | Source: Ambulatory Visit | Attending: Radiation Oncology | Admitting: Radiation Oncology

## 2018-02-04 ENCOUNTER — Inpatient Hospital Stay: Payer: BLUE CROSS/BLUE SHIELD

## 2018-02-04 ENCOUNTER — Telehealth: Payer: Self-pay | Admitting: Radiation Oncology

## 2018-02-04 DIAGNOSIS — C3411 Malignant neoplasm of upper lobe, right bronchus or lung: Secondary | ICD-10-CM

## 2018-02-04 DIAGNOSIS — C770 Secondary and unspecified malignant neoplasm of lymph nodes of head, face and neck: Secondary | ICD-10-CM | POA: Diagnosis not present

## 2018-02-04 NOTE — Progress Notes (Signed)
The patient underwent repeated CT simulation today to account for potentially growing upper neck adenopathy.

## 2018-02-04 NOTE — Telephone Encounter (Signed)
Patient wasn't feeling well on Friday. Phoned patient's home and wife's cell to inquire about current status. No answer. Left message on his wife's cell requesting a call back with an update.

## 2018-02-05 ENCOUNTER — Other Ambulatory Visit: Payer: Self-pay

## 2018-02-05 ENCOUNTER — Ambulatory Visit: Payer: Self-pay | Admitting: Hematology

## 2018-02-05 ENCOUNTER — Ambulatory Visit
Admission: RE | Admit: 2018-02-05 | Discharge: 2018-02-05 | Disposition: A | Discharge status: Home / Self Care | Payer: BLUE CROSS/BLUE SHIELD | Source: Ambulatory Visit | Attending: Radiation Oncology | Admitting: Radiation Oncology

## 2018-02-05 DIAGNOSIS — C770 Secondary and unspecified malignant neoplasm of lymph nodes of head, face and neck: Secondary | ICD-10-CM | POA: Diagnosis not present

## 2018-02-05 DIAGNOSIS — C3492 Malignant neoplasm of unspecified part of left bronchus or lung: Secondary | ICD-10-CM

## 2018-02-05 MED ORDER — DEXAMETHASONE 4 MG PO TABS: 8.0000 mg | ORAL_TABLET | Freq: Every day | ORAL | 1 refills | Status: DC

## 2018-02-05 MED ORDER — PROCHLORPERAZINE MALEATE 10 MG PO TABS: 10.0000 mg | ORAL_TABLET | Freq: Four times a day (QID) | ORAL | 1 refills | Status: DC | PRN

## 2018-02-05 MED ORDER — ONDANSETRON HCL 8 MG PO TABS: 8.0000 mg | ORAL_TABLET | Freq: Two times a day (BID) | ORAL | 1 refills | Status: DC | PRN

## 2018-02-06 ENCOUNTER — Other Ambulatory Visit: Payer: Self-pay | Admitting: Hematology

## 2018-02-06 ENCOUNTER — Inpatient Hospital Stay: Payer: BLUE CROSS/BLUE SHIELD

## 2018-02-06 ENCOUNTER — Ambulatory Visit: Payer: Self-pay

## 2018-02-06 ENCOUNTER — Ambulatory Visit
Admission: RE | Admit: 2018-02-06 | Discharge: 2018-02-06 | Disposition: A | Discharge status: Home / Self Care | Payer: BLUE CROSS/BLUE SHIELD | Source: Ambulatory Visit | Attending: Radiation Oncology | Admitting: Radiation Oncology

## 2018-02-06 VITALS — BP 164/97 | HR 113 | Temp 98.7°F | Resp 18

## 2018-02-06 DIAGNOSIS — R531 Weakness: Secondary | ICD-10-CM

## 2018-02-06 DIAGNOSIS — C3492 Malignant neoplasm of unspecified part of left bronchus or lung: Secondary | ICD-10-CM | POA: Diagnosis not present

## 2018-02-06 DIAGNOSIS — Z7189 Other specified counseling: Secondary | ICD-10-CM

## 2018-02-06 DIAGNOSIS — I871 Compression of vein: Secondary | ICD-10-CM

## 2018-02-06 DIAGNOSIS — C770 Secondary and unspecified malignant neoplasm of lymph nodes of head, face and neck: Secondary | ICD-10-CM | POA: Diagnosis not present

## 2018-02-06 LAB — CBC WITH DIFFERENTIAL (CANCER CENTER ONLY)
Basophils Absolute: 0 10*3/uL (ref 0.0–0.1)
Basophils Relative: 0 %
EOS ABS: 0 10*3/uL (ref 0.0–0.5)
Eosinophils Relative: 0 %
HEMATOCRIT: 28.6 % — AB (ref 38.4–49.9)
HEMOGLOBIN: 9.2 g/dL — AB (ref 13.0–17.1)
LYMPHS ABS: 0 10*3/uL — AB (ref 0.9–3.3)
LYMPHS PCT: 1 %
MCH: 28.3 pg (ref 27.2–33.4)
MCHC: 32.1 g/dL (ref 32.0–36.0)
MCV: 88 fL (ref 79.3–98.0)
Monocytes Absolute: 0.5 10*3/uL (ref 0.1–0.9)
Monocytes Relative: 5 %
NEUTROS ABS: 9.9 10*3/uL — AB (ref 1.5–6.5)
NEUTROS PCT: 94 %
Platelet Count: 372 10*3/uL (ref 140–400)
RBC: 3.26 MIL/uL — AB (ref 4.20–5.82)
RDW: 16.2 % — ABNORMAL HIGH (ref 11.0–14.6)
WBC: 10.5 10*3/uL — AB (ref 4.0–10.3)

## 2018-02-06 LAB — CMP (CANCER CENTER ONLY)
ALBUMIN: 2.3 g/dL — AB (ref 3.5–5.0)
ALT: 45 U/L — ABNORMAL HIGH (ref 0–44)
AST: 27 U/L (ref 15–41)
Alkaline Phosphatase: 162 U/L — ABNORMAL HIGH (ref 38–126)
Anion gap: 9 (ref 5–15)
BUN: 18 mg/dL (ref 6–20)
CALCIUM: 9.3 mg/dL (ref 8.9–10.3)
CHLORIDE: 102 mmol/L (ref 98–111)
CO2: 28 mmol/L (ref 22–32)
Creatinine: 0.7 mg/dL (ref 0.61–1.24)
GFR, Estimated: >60 mL/min (ref >60)
Glucose, Bld: 95 mg/dL (ref 70–99)
POTASSIUM: 4.5 mmol/L (ref 3.5–5.1)
SODIUM: 139 mmol/L (ref 135–145)
Total Bilirubin: <0.2 mg/dL — ABNORMAL LOW (ref 0.3–1.2)
Total Protein: 6.9 g/dL (ref 6.5–8.1)

## 2018-02-06 LAB — MAGNESIUM: Magnesium: 1.7 mg/dL (ref 1.7–2.4)

## 2018-02-06 MED ORDER — FAMOTIDINE IN NACL 20-0.9 MG/50ML-% IV SOLN
INTRAVENOUS | Status: AC
  Filled 2018-02-06: qty 50

## 2018-02-06 MED ORDER — SODIUM CHLORIDE 0.9 % IV SOLN
280.0000 mg | Freq: Once | INTRAVENOUS | Status: AC
  Administered 2018-02-06: 280 mg via INTRAVENOUS
  Filled 2018-02-06: qty 28

## 2018-02-06 MED ORDER — PALONOSETRON HCL INJECTION 0.25 MG/5ML
INTRAVENOUS | Status: AC
  Filled 2018-02-06: qty 5

## 2018-02-06 MED ORDER — PALONOSETRON HCL INJECTION 0.25 MG/5ML
0.2500 mg | Freq: Once | INTRAVENOUS | Status: AC
  Administered 2018-02-06: 0.25 mg via INTRAVENOUS

## 2018-02-06 MED ORDER — DIPHENHYDRAMINE HCL 50 MG/ML IJ SOLN
50.0000 mg | Freq: Once | INTRAMUSCULAR | Status: AC
  Administered 2018-02-06: 50 mg via INTRAVENOUS

## 2018-02-06 MED ORDER — SODIUM CHLORIDE 0.9 % IV SOLN
20.0000 mg | Freq: Once | INTRAVENOUS | Status: AC
  Administered 2018-02-06: 20 mg via INTRAVENOUS
  Filled 2018-02-06: qty 2

## 2018-02-06 MED ORDER — DIPHENHYDRAMINE HCL 50 MG/ML IJ SOLN
INTRAMUSCULAR | Status: AC
  Filled 2018-02-06: qty 1

## 2018-02-06 MED ORDER — FAMOTIDINE IN NACL 20-0.9 MG/50ML-% IV SOLN
20.0000 mg | Freq: Once | INTRAVENOUS | Status: AC
  Administered 2018-02-06: 20 mg via INTRAVENOUS

## 2018-02-06 MED ORDER — SODIUM CHLORIDE 0.9 % IV SOLN
45.0000 mg/m2 | Freq: Once | INTRAVENOUS | Status: AC
  Administered 2018-02-06: 90 mg via INTRAVENOUS
  Filled 2018-02-06: qty 15

## 2018-02-06 MED ORDER — SODIUM CHLORIDE 0.9 % IV SOLN
Freq: Once | INTRAVENOUS | Status: AC
  Administered 2018-02-06: 09:00:00 via INTRAVENOUS
  Filled 2018-02-06: qty 250

## 2018-02-06 NOTE — Patient Instructions (Signed)
[facility] Discharge Instructions for Patients Receiving Chemotherapy  Today you received the following chemotherapy agents Taxol, Carboplatin.  To help prevent nausea and vomiting after your treatment, we encourage you to take your nausea medication.    If you develop nausea and vomiting that is not controlled by your nausea medication, call the clinic.   BELOW ARE SYMPTOMS THAT SHOULD BE REPORTED IMMEDIATELY:  *FEVER GREATER THAN 100.5 F  *CHILLS WITH OR WITHOUT FEVER  NAUSEA AND VOMITING THAT IS NOT CONTROLLED WITH YOUR NAUSEA MEDICATION  *UNUSUAL SHORTNESS OF BREATH  *UNUSUAL BRUISING OR BLEEDING  TENDERNESS IN MOUTH AND THROAT WITH OR WITHOUT PRESENCE OF ULCERS  *URINARY PROBLEMS  *BOWEL PROBLEMS  UNUSUAL RASH Items with * indicate a potential emergency and should be followed up as soon as possible.  Feel free to call the clinic should you have any questions or concerns. The clinic phone number is [phone].  Please show the [place] at check-in to the Emergency Department and triage nurse.  Paclitaxel injection What is this medicine? PACLITAXEL (PAK li TAX el) is a chemotherapy drug. It targets fast dividing cells, like cancer cells, and causes these cells to die. This medicine is used to treat ovarian cancer, breast cancer, and other cancers. This medicine may be used for other purposes; ask your health care provider or pharmacist if you have questions. COMMON BRAND NAME(S): Onxol, Taxol What should I tell my health care provider before I take this medicine? They need to know if you have any of these conditions: -blood disorders -irregular heartbeat -infection (especially a virus infection such as chickenpox, cold sores, or herpes) -liver disease -previous or ongoing radiation therapy -an unusual or allergic reaction to paclitaxel, alcohol, polyoxyethylated castor oil, other chemotherapy agents, other medicines, foods, dyes, or  preservatives -pregnant or trying to get pregnant -breast-feeding How should I use this medicine? This drug is given as an infusion into a vein. It is administered in a hospital or clinic by a specially trained health care professional. Talk to your pediatrician regarding the use of this medicine in children. Special care may be needed. Overdosage: If you think you have taken too much of this medicine contact a poison control center or emergency room at once. NOTE: This medicine is only for you. Do not share this medicine with others. What if I miss a dose? It is important not to miss your dose. Call your doctor or health care professional if you are unable to keep an appointment. What may interact with this medicine? Do not take this medicine with any of the following medications: -disulfiram -metronidazole This medicine may also interact with the following medications: -cyclosporine -diazepam -ketoconazole -medicines to increase blood counts like filgrastim, pegfilgrastim, sargramostim -other chemotherapy drugs like cisplatin, doxorubicin, epirubicin, etoposide, teniposide, vincristine -quinidine -testosterone -vaccines -verapamil Talk to your doctor or health care professional before taking any of these medicines: -acetaminophen -aspirin -ibuprofen -ketoprofen -naproxen This list may not describe all possible interactions. Give your health care provider a list of all the medicines, herbs, non-prescription drugs, or dietary supplements you use. Also tell them if you smoke, drink alcohol, or use illegal drugs. Some items may interact with your medicine. What should I watch for while using this medicine? Your condition will be monitored carefully while you are receiving this medicine. You will need important blood work done while you are taking this medicine. This medicine can cause serious allergic reactions. To reduce your risk you will need to take  other medicine(s) before  treatment with this medicine. If you experience allergic reactions like skin rash, itching or hives, swelling of the face, lips, or tongue, tell your doctor or health care professional right away. In some cases, you may be given additional medicines to help with side effects. Follow all directions for their use. This drug may make you feel generally unwell. This is not uncommon, as chemotherapy can affect healthy cells as well as cancer cells. Report any side effects. Continue your course of treatment even though you feel ill unless your doctor tells you to stop. Call your doctor or health care professional for advice if you get a fever, chills or sore throat, or other symptoms of a cold or flu. Do not treat yourself. This drug decreases your body's ability to fight infections. Try to avoid being around people who are sick. This medicine may increase your risk to bruise or bleed. Call your doctor or health care professional if you notice any unusual bleeding. Be careful brushing and flossing your teeth or using a toothpick because you may get an infection or bleed more easily. If you have any dental work done, tell your dentist you are receiving this medicine. Avoid taking products that contain aspirin, acetaminophen, ibuprofen, naproxen, or ketoprofen unless instructed by your doctor. These medicines may hide a fever. Do not become pregnant while taking this medicine. Women should inform their doctor if they wish to become pregnant or think they might be pregnant. There is a potential for serious side effects to an unborn child. Talk to your health care professional or pharmacist for more information. Do not breast-feed an infant while taking this medicine. Men are advised not to father a child while receiving this medicine. This product may contain alcohol. Ask your pharmacist or healthcare provider if this medicine contains alcohol. Be sure to tell all healthcare providers you are taking this medicine.  Certain medicines, like metronidazole and disulfiram, can cause an unpleasant reaction when taken with alcohol. The reaction includes flushing, headache, nausea, vomiting, sweating, and increased thirst. The reaction can last from 30 minutes to several hours. What side effects may I notice from receiving this medicine? Side effects that you should report to your doctor or health care professional as soon as possible: -allergic reactions like skin rash, itching or hives, swelling of the face, lips, or tongue -low blood counts - This drug may decrease the number of white blood cells, red blood cells and platelets. You may be at increased risk for infections and bleeding. -signs of infection - fever or chills, cough, sore throat, pain or difficulty passing urine -signs of decreased platelets or bleeding - bruising, pinpoint red spots on the skin, black, tarry stools, nosebleeds -signs of decreased red blood cells - unusually weak or tired, fainting spells, lightheadedness -breathing problems -chest pain -high or low blood pressure -mouth sores -nausea and vomiting -pain, swelling, redness or irritation at the injection site -pain, tingling, numbness in the hands or feet -slow or irregular heartbeat -swelling of the ankle, feet, hands Side effects that usually do not require medical attention (report to your doctor or health care professional if they continue or are bothersome): -bone pain -complete hair loss including hair on your head, underarms, pubic hair, eyebrows, and eyelashes -changes in the color of fingernails -diarrhea -loosening of the fingernails -loss of appetite -muscle or joint pain -red flush to skin -sweating This list may not describe all possible side effects. Call your doctor for medical advice about side   effects. You may report side effects to FDA at 1-800-FDA-1088. Where should I keep my medicine? This drug is given in a hospital or clinic and will not be stored at  home. NOTE: This sheet is a summary. It may not cover all possible information. If you have questions about this medicine, talk to your doctor, pharmacist, or health care provider.  2018 Elsevier/Gold Standard (2015-03-30 19:58:00)   Carboplatin injection What is this medicine? CARBOPLATIN (KAR boe pla tin) is a chemotherapy drug. It targets fast dividing cells, like cancer cells, and causes these cells to die. This medicine is used to treat ovarian cancer and many other cancers. This medicine may be used for other purposes; ask your health care provider or pharmacist if you have questions. COMMON BRAND NAME(S): Paraplatin What should I tell my health care provider before I take this medicine? They need to know if you have any of these conditions: -blood disorders -hearing problems -kidney disease -recent or ongoing radiation therapy -an unusual or allergic reaction to carboplatin, cisplatin, other chemotherapy, other medicines, foods, dyes, or preservatives -pregnant or trying to get pregnant -breast-feeding How should I use this medicine? This drug is usually given as an infusion into a vein. It is administered in a hospital or clinic by a specially trained health care professional. Talk to your pediatrician regarding the use of this medicine in children. Special care may be needed. Overdosage: If you think you have taken too much of this medicine contact a poison control center or emergency room at once. NOTE: This medicine is only for you. Do not share this medicine with others. What if I miss a dose? It is important not to miss a dose. Call your doctor or health care professional if you are unable to keep an appointment. What may interact with this medicine? -medicines for seizures -medicines to increase blood counts like filgrastim, pegfilgrastim, sargramostim -some antibiotics like amikacin, gentamicin, neomycin, streptomycin, tobramycin -vaccines Talk to your doctor or health  care professional before taking any of these medicines: -acetaminophen -aspirin -ibuprofen -ketoprofen -naproxen This list may not describe all possible interactions. Give your health care provider a list of all the medicines, herbs, non-prescription drugs, or dietary supplements you use. Also tell them if you smoke, drink alcohol, or use illegal drugs. Some items may interact with your medicine. What should I watch for while using this medicine? Your condition will be monitored carefully while you are receiving this medicine. You will need important blood work done while you are taking this medicine. This drug may make you feel generally unwell. This is not uncommon, as chemotherapy can affect healthy cells as well as cancer cells. Report any side effects. Continue your course of treatment even though you feel ill unless your doctor tells you to stop. In some cases, you may be given additional medicines to help with side effects. Follow all directions for their use. Call your doctor or health care professional for advice if you get a fever, chills or sore throat, or other symptoms of a cold or flu. Do not treat yourself. This drug decreases your body's ability to fight infections. Try to avoid being around people who are sick. This medicine may increase your risk to bruise or bleed. Call your doctor or health care professional if you notice any unusual bleeding. Be careful brushing and flossing your teeth or using a toothpick because you may get an infection or bleed more easily. If you have any dental work done, tell your dentist you   are receiving this medicine. Avoid taking products that contain aspirin, acetaminophen, ibuprofen, naproxen, or ketoprofen unless instructed by your doctor. These medicines may hide a fever. Do not become pregnant while taking this medicine. Women should inform their doctor if they wish to become pregnant or think they might be pregnant. There is a potential for serious  side effects to an unborn child. Talk to your health care professional or pharmacist for more information. Do not breast-feed an infant while taking this medicine. What side effects may I notice from receiving this medicine? Side effects that you should report to your doctor or health care professional as soon as possible: -allergic reactions like skin rash, itching or hives, swelling of the face, lips, or tongue -signs of infection - fever or chills, cough, sore throat, pain or difficulty passing urine -signs of decreased platelets or bleeding - bruising, pinpoint red spots on the skin, black, tarry stools, nosebleeds -signs of decreased red blood cells - unusually weak or tired, fainting spells, lightheadedness -breathing problems -changes in hearing -changes in vision -chest pain -high blood pressure -low blood counts - This drug may decrease the number of white blood cells, red blood cells and platelets. You may be at increased risk for infections and bleeding. -nausea and vomiting -pain, swelling, redness or irritation at the injection site -pain, tingling, numbness in the hands or feet -problems with balance, talking, walking -trouble passing urine or change in the amount of urine Side effects that usually do not require medical attention (report to your doctor or health care professional if they continue or are bothersome): -hair loss -loss of appetite -metallic taste in the mouth or changes in taste This list may not describe all possible side effects. Call your doctor for medical advice about side effects. You may report side effects to FDA at 1-800-FDA-1088. Where should I keep my medicine? This drug is given in a hospital or clinic and will not be stored at home. NOTE: This sheet is a summary. It may not cover all possible information. If you have questions about this medicine, talk to your doctor, pharmacist, or health care provider.  2018 Elsevier/Gold Standard (2007-09-03  14:38:05)

## 2018-02-06 NOTE — Progress Notes (Signed)
Per Dr. Irene Limbo, ok to treat with current heart rate

## 2018-02-07 ENCOUNTER — Ambulatory Visit
Admission: RE | Admit: 2018-02-07 | Discharge: 2018-02-07 | Disposition: A | Discharge status: Home / Self Care | Payer: BLUE CROSS/BLUE SHIELD | Source: Ambulatory Visit | Attending: Radiation Oncology | Admitting: Radiation Oncology

## 2018-02-07 DIAGNOSIS — C770 Secondary and unspecified malignant neoplasm of lymph nodes of head, face and neck: Secondary | ICD-10-CM | POA: Diagnosis not present

## 2018-02-08 ENCOUNTER — Ambulatory Visit
Admission: RE | Admit: 2018-02-08 | Discharge: 2018-02-08 | Disposition: A | Discharge status: Home / Self Care | Payer: BLUE CROSS/BLUE SHIELD | Source: Ambulatory Visit | Attending: Radiation Oncology | Admitting: Radiation Oncology

## 2018-02-08 DIAGNOSIS — C770 Secondary and unspecified malignant neoplasm of lymph nodes of head, face and neck: Secondary | ICD-10-CM | POA: Diagnosis not present

## 2018-02-12 ENCOUNTER — Ambulatory Visit
Admission: RE | Admit: 2018-02-12 | Discharge: 2018-02-12 | Disposition: A | Discharge status: Home / Self Care | Payer: BLUE CROSS/BLUE SHIELD | Source: Ambulatory Visit | Attending: Radiation Oncology | Admitting: Radiation Oncology

## 2018-02-12 ENCOUNTER — Ambulatory Visit: Payer: BLUE CROSS/BLUE SHIELD | Admitting: Vascular Surgery

## 2018-02-12 ENCOUNTER — Other Ambulatory Visit: Payer: Self-pay

## 2018-02-12 DIAGNOSIS — C770 Secondary and unspecified malignant neoplasm of lymph nodes of head, face and neck: Secondary | ICD-10-CM | POA: Insufficient documentation

## 2018-02-12 DIAGNOSIS — C3492 Malignant neoplasm of unspecified part of left bronchus or lung: Secondary | ICD-10-CM

## 2018-02-12 DIAGNOSIS — C3412 Malignant neoplasm of upper lobe, left bronchus or lung: Secondary | ICD-10-CM | POA: Diagnosis not present

## 2018-02-12 DIAGNOSIS — Z510 Encounter for antineoplastic radiation therapy: Secondary | ICD-10-CM | POA: Insufficient documentation

## 2018-02-12 NOTE — Progress Notes (Signed)
HEMATOLOGY/ONCOLOGY CONSULTATION NOTE  Date of Service: 02/13/2018  Patient Care Team: [name], MD as PCP - General (Family Medicine) Brunetta Genera, MD as Medical Oncologist (Hematology)  CHIEF COMPLAINTS/PURPOSE OF CONSULTATION:  Metastatic Lung Adenocarcinoma  HISTORY OF PRESENTING ILLNESS:  NIV Tony [name] is a wonderful 52 y.o. male who has been referred to [place] by Dr. Durenda Hurt for evaluation, management and followup for newly diagnosed metastatic poorly differentiated adenocarcinoma likely of lung primary. Upper GI series showed no overt esophageal or gastric tumor . Has been seen by radiation oncology and was transferred to [facility] long and today started on palliative radiation for pain control and for impending SVC syndrome due to bulky disease in his chest. He has completed antibiotics for his left groin infection and the wound incisions of healed. Still having significant right upper extremity swelling.  Has extensive bilateral cervical and mediastinal lymphadenopathy. Continues to be on anticoagulation with fondaparinux for his DVT. We discussed the lab studies imaging findings and the role for palliative chemotherapy in the setting.  He is agreeable to this and would like to pursue an aggressive course of treatment understanding that it might not be curative. We discussed starting him on weekly carboplatin + Taxol while on concurrent RT. Pending foundation one and PDL1 testing results   Interval History:   Tony [name] returns today for management and evaluation of his Metastatic Lung Adenocarcinoma. The patient's last visit with [place] was on 01/30/18. He is accompanied today by his wife. The pt reports that he is doing well overall. He will be completing RT on 02/21/18.   The pt reports that he developed some nausea for two days that was controlled with his supportive nausea medications. He notes that the radiation field has expanded into his left armpit . He  denies any fevers or chills. He notes that he has noticed new masses in his left neck and both armpits. He also notes that the mass on his right neck may be a little larger.  He notes that he has mostly clear liquid coming from his previous surgical site at his left knee.   The pt notes that he feels more fatigued.   Lab results today (02/13/18) of CBC w/diff, CMP is as follows: all values are WNL except for RBC at 3.01, HGB at 8.6, HCT at 26.8, RDW at 16.5, ANC at 7.8k, Lymphs abs at 0.0, Glucose at 111, Albumin at 2.3, Alk Phos at 137, Total Bilirubin at <0.2. 02/13/18 Magnesium is 1.9  On review of systems, pt reports some fatigue, new mass on left neck, new mass on both armpits, recent controlled nausea, and denies fevers, chills, and any other symptoms.    MEDICAL HISTORY:  Past Medical History:  Diagnosis Date  . Anxiety   . Arterial occlusive disease    multilevel arterial occlusive disease/notes 11/13/2017  . Arthritis    "left knee" (11/13/2017)  . Chronic back pain   . Chronic lower back pain   . Depression   . Gastric ulcer due to Helicobacter pylori 7322  . GERD (gastroesophageal reflux disease)   . High cholesterol   . Hypertension   . IBS (irritable bowel syndrome)   . Mesenteric adenitis 2011   presumed/H&P  . Migraines    "use to suffer migraines years ago" (11/13/2017)  . Peripheral vascular disease ([place])   . Pneumonia 2011  . Ulcer     SURGICAL HISTORY: Past Surgical History:  Procedure Laterality Date  . AORTA -  BILATERAL FEMORAL ARTERY BYPASS GRAFT Bilateral 11/15/2017   Procedure: AORTA BIFEMORAL BYPASS GRAFT;  Surgeon: Angelia Mould, MD;  Location: [place];  Service: Vascular;  Laterality: Bilateral;  . BACK SURGERY  X 4   "procedure where they burn the nerves every 6 months"  . EMBOLECTOMY Left 12/01/2017   Procedure: THROMBECTOMY OF LEFT AORTAFEMORAL BYPASS, THROMBECTOMY OF TIBIAL ARTERY;  Surgeon: Rosetta Posner, MD;  Location: [facility] OR;  Service: Vascular;   Laterality: Left;  . ESOPHAGOGASTRODUODENOSCOPY  08/22/2011   Procedure: ESOPHAGOGASTRODUODENOSCOPY (EGD);  Surgeon: [name], MD;  Location: [place];  Service: Gastroenterology;  Laterality: N/A;  . FEMORAL-POPLITEAL BYPASS GRAFT Right 11/15/2017   Procedure: Right FEMORAL-Above knee POPLITEAL ARTERY Bypass Graft using nonreversed saphenous vein ;  Surgeon: Angelia Mould, MD;  Location: [place];  Service: Vascular;  Laterality: Right;  . FEMORAL-POPLITEAL BYPASS GRAFT Left 12/01/2017   Procedure: BYPASS GRAFT FEMORAL- ABOVE KNEE POPLITEAL ARTERY WITH VEIN;  Surgeon: Rosetta Posner, MD;  Location: [place];  Service: Vascular;  Laterality: Left;  . INTRAOPERATIVE ARTERIOGRAM Right 11/15/2017   Procedure: INTRA OPERATIVE ARTERIOGRAM;  Surgeon: Angelia Mould, MD;  Location: [place];  Service: Vascular;  Laterality: Right;  . INTRAOPERATIVE ARTERIOGRAM Left 12/01/2017   Procedure: INTRA OPERATIVE ARTERIOGRAM OF LEFT LEG;  Surgeon: Rosetta Posner, MD;  Location: [place];  Service: Vascular;  Laterality: Left;  . IR US GUIDE BX ASP/DRAIN  12/18/2017  . KNEE ARTHROSCOPY Left X 5    SOCIAL HISTORY: Social History   Socioeconomic History  . Marital status: Married    Spouse name: Not on file  . Number of children: 3  . Years of education: Not on file  . Highest education level: Not on file  Occupational History  . Occupation: Admin. Assistant    Employer: [employer]  Social Needs  . Financial resource strain: Not on file  . Food insecurity:    Worry: Not on file    Inability: Not on file  . Transportation needs:    Medical: Not on file    Non-medical: Not on file  Tobacco Use  . Smoking status: Former Smoker    Packs/day: 1.00    Years: 24.00    Pack years: 24.00    Types: Cigarettes    Start date: 11/28/2017    Last attempt to quit: 12/04/2017    Years since quitting: 0.1  . Smokeless tobacco: Never Used  Substance and Sexual Activity  . Alcohol use: Yes    Comment:  11/13/2017 "did drink wine q  1-2 months; nothing anymore"   . Drug use: Not Currently    Types: Marijuana    Comment: 11/13/2017  "last drug use was in ~ 1987 "  . Sexual activity: Not on file  Lifestyle  . Physical activity:    Days per week: Not on file    Minutes per session: Not on file  . Stress: Not on file  Relationships  . Social connections:    Talks on phone: Not on file    Gets together: Not on file    Attends religious service: Not on file    Active member of club or organization: Not on file    Attends meetings of clubs or organizations: Not on file    Relationship status: Not on file  . Intimate partner violence:    Fear of current or ex partner: Not on file    Emotionally abused: Not on file  Physically abused: Not on file    Forced sexual activity: Not on file  Other Topics Concern  . Not on file  Social History Narrative   Lives with wife and two children.  Administrator at [employer]: Family History  Problem Relation Age of Onset  . Hypertension Mother   . Hypertension Father   . Coronary artery disease Father 26  . Heart attack Brother 72  . Coronary artery disease Brother   . Diabetes Neg Hx   . Stroke Neg Hx     ALLERGIES:  is allergic to pork-derived products and shellfish-derived products.  MEDICATIONS:  Current Outpatient Medications  Medication Sig Dispense Refill  . acetaminophen (TYLENOL) 500 MG tablet Take 2 tablets (1,000 mg total) by mouth 3 (three) times daily. 30 tablet 0  . atorvastatin (LIPITOR) 40 MG tablet Take 1 tablet (40 mg total) by mouth at bedtime. 30 tablet 0  . belladonna-PHENObarbital (DONNATAL) 16.2 MG/5ML ELIX Take 10 mLs (32.4 mg total) by mouth 3 (three) times daily as needed for cramping or pain. 600 mL 0  . dexamethasone (DECADRON) 4 MG tablet Take 1 tablet (4 mg total) by mouth every 12 (twelve) hours. 60 tablet 0  . dexamethasone (DECADRON) 4 MG tablet Take 2 tablets (8 mg total) by mouth daily. Start  the day after chemotherapy for 2 days. 30 tablet 1  . dronabinol (MARINOL) 2.5 MG capsule Take 1 capsule (2.5 mg total) by mouth 2 (two) times daily before lunch and supper. 60 capsule 0  . DULoxetine (CYMBALTA) 60 MG capsule Take 1 capsule (60 mg total) by mouth daily. 30 capsule 0  . fluconazole (DIFLUCAN) 100 MG tablet Take 1 tablet (100 mg total) by mouth daily. 30 tablet 0  . fondaparinux (ARIXTRA) 7.5 MG/0.6ML SOLN injection Inject 0.6 mLs (7.5 mg total) into the skin daily at 6 PM for 26 days. 15.6 mL 0  . gabapentin (NEURONTIN) 400 MG capsule Take 1 capsule (400 mg total) by mouth 2 (two) times daily. 60 capsule 0  . HYDROmorphone (DILAUDID) 8 MG tablet Take 1 tablet (8 mg total) by mouth every 2 (two) hours as needed for up to 14 days for severe pain (breakthrough pain, as directed). 168 tablet 0  . lidocaine (XYLOCAINE) 2 % solution Use as directed 15 mLs in the mouth or throat every 3 (three) hours as needed for up to 15 days for mouth pain (throat pain). 200 mL 0  . LORazepam (ATIVAN) 1 MG tablet Take 0.5 tablets (0.5 mg total) by mouth 2 (two) times daily AND 1 tablet (1 mg total) at bedtime. 60 tablet 0  . methadone (DOLOPHINE) 10 MG tablet Take 2 tablets (20 mg total) by mouth every 8 (eight) hours. 180 tablet 0  . methocarbamol (ROBAXIN) 750 MG tablet Take 1 tablet (750 mg total) by mouth 3 (three) times daily. 90 tablet 0  . metoprolol succinate (TOPROL-XL) 100 MG 24 hr tablet Take 100 mg by mouth at bedtime. Take with or immediately following a meal.    . nutrition supplement, JUVEN, (JUVEN) PACK Take 1 packet by mouth 2 (two) times daily between meals. 60 packet 0  . OLANZapine (ZYPREXA) 5 MG tablet Take 1 tablet (5 mg total) by mouth at bedtime. 30 tablet 0  . omeprazole (PRILOSEC) 40 MG capsule Take 1 capsule (40 mg total) by mouth daily. 30 capsule 0  . ondansetron (ZOFRAN) 4 MG tablet Take 1 tablet (4 mg total) by mouth every 6 (  six) hours as needed for nausea or vomiting. 20  tablet 0  . ondansetron (ZOFRAN) 8 MG tablet Take 1 tablet (8 mg total) by mouth 2 (two) times daily as needed for refractory nausea / vomiting. Start on day 3 after chemo. 30 tablet 1  . pantoprazole (PROTONIX) 40 MG tablet Take 1 tablet (40 mg total) by mouth daily. 30 tablet 0  . polyethylene glycol (MIRALAX / GLYCOLAX) packet Take 17 g by mouth daily. 14 each 0  . povidone-iodine (BETADINE) 10 % external solution Apply topically as needed for wound care. 480 mL 0  . prochlorperazine (COMPAZINE) 10 MG tablet Take 1 tablet (10 mg total) by mouth every 6 (six) hours as needed (Nausea or vomiting). 30 tablet 1  . senna-docusate (SENOKOT-S) 8.6-50 MG tablet Take 2 tablets by mouth 2 (two) times daily. 30 tablet 0  . sulfamethoxazole-trimethoprim (BACTRIM DS,SEPTRA DS) 800-160 MG tablet Take 1 tablet by mouth daily. 30 tablet 0  . triamcinolone ointment (KENALOG) 0.5 % Apply 1 application topically 2 (two) times daily. Use at site of radiation burn 60 g 0   No current facility-administered medications for this visit.    Facility-Administered Medications Ordered in Other Visits  Medication Dose Route Frequency Provider Last Rate Last Dose  . 0.9 %  sodium chloride infusion   Intravenous Once Brunetta Genera, MD      . CARBOplatin (PARAPLATIN) 280 mg in sodium chloride 0.9 % 250 mL chemo infusion  280 mg Intravenous Once Brunetta Genera, MD      . dexamethasone (DECADRON) 20 mg in sodium chloride 0.9 % 50 mL IVPB  20 mg Intravenous Once Brunetta Genera, MD      . diphenhydrAMINE (BENADRYL) injection 50 mg  50 mg Intravenous Once Brunetta Genera, MD      . famotidine (PEPCID) IVPB 20 mg premix  20 mg Intravenous Once Brunetta Genera, MD      . PACLitaxel (TAXOL) 90 mg in sodium chloride 0.9 % 250 mL chemo infusion (</= 5m/m2)  45 mg/m2 (Treatment Plan Recorded) Intravenous Once KBrunetta Genera MD      . palonosetron (ALOXI) injection 0.25 mg  0.25 mg Intravenous Once  KBrunetta Genera MD        REVIEW OF SYSTEMS:    A 10+ POINT REVIEW OF SYSTEMS WAS OBTAINED including neurology, dermatology, psychiatry, cardiac, respiratory, lymph, extremities, GI, GU, Musculoskeletal, constitutional, breasts, reproductive, HEENT.  All pertinent positives are noted in the HPI.  All others are negative.   PHYSICAL EXAMINATION: ECOG PERFORMANCE STATUS: 2 - Symptomatic, <50% confined to bed  Vitals:   02/13/18 1410  BP: 138/85  Pulse: (!) 103  Resp: 18  Temp: 98.2 F (36.8 C)  SpO2: 97%   Filed Weights   02/13/18 1410  Weight: 164 lb 8 oz (74.6 kg)   .Body mass index is 22.31 kg/m.  GENERAL:alert, in no acute distress and comfortable SKIN: grade 2 radiation skin injury over right neck and right upper chest EYES: conjunctiva are pink and non-injected, sclera anicteric OROPHARYNX: MMM, no exudates, no oropharyngeal erythema or ulceration NECK: b/l cervical LNadenopathy R>>L improved, improved erythema over neck, supraclavicular area and chest wall with decreased skin edema LYMPH:  Progressive palpable b/l cervical lymphadenopathy, b/l axillary LNadenopathy LUNGS: clear to auscultation b/l with normal respiratory effort HEART: regular rate & rhythm ABDOMEN:  normoactive bowel sounds , non tender, not distended. No palpable hepatosplenomegaly.  Extremity: Resolved edema in RUE and lower extremities. Rt  foot gangrenous changes. Rt groin wound healed with no discharge at this time.  PSYCH: alert & oriented x 3 with fluent speech NEURO: no focal motor/sensory deficits   LABORATORY DATA:  I have reviewed the data as listed  . CBC Latest Ref Rng & Units 02/13/2018 02/06/2018 01/21/2018  WBC 4.0 - 10.3 K/uL 8.2 10.5(H) 9.1  Hemoglobin 13.0 - 17.1 g/dL 8.6(L) 9.2(L) 9.1(L)  Hematocrit 38.4 - 49.9 % 26.8(L) 28.6(L) 29.2(L)  Platelets 140 - 400 K/uL 294 372 406(H)    . CMP Latest Ref Rng & Units 02/13/2018 02/06/2018 01/23/2018  Glucose 70 - 99 mg/dL 111(H) 95  118(H)  BUN 6 - 20 mg/dL 15 18 26(H)  Creatinine 0.61 - 1.24 mg/dL 0.66 0.70 0.79  Sodium 135 - 145 mmol/L 138 139 141  Potassium 3.5 - 5.1 mmol/L 5.0 4.5 5.5(H)  Chloride 98 - 111 mmol/L 102 102 102  CO2 22 - 32 mmol/L _0 Calcium 8.9 - 10.3 mg/dL 9.7 9.3 9.4  Total Protein 6.5 - 8.1 g/dL 6.8 6.9 -  Total Bilirubin 0.3 - 1.2 mg/dL <0.2(L) <0.2(L) -  Alkaline Phos 38 - 126 U/L 137(H) 162(H) -  AST 15 - 41 U/L 22 27 -  ALT 0 - 44 U/L 26 45(H) -   12/18/17 Soft tissue biopsy:    12/18/17 [facility] One Results:      RADIOGRAPHIC STUDIES: I have personally reviewed the radiological images as listed and agreed with the findings in the report. Ct Soft Tissue Neck W Contrast  Result Date: 01/16/2018 CLINICAL DATA:  RIGHT neck pain for 1 month after PICC line placement. New dysphagia and stridor. History of lung cancer and RIGHT neck venous thrombosis. EXAM: CT NECK WITH CONTRAST TECHNIQUE: Multidetector CT imaging of the neck was performed using the standard protocol following the bolus administration of intravenous contrast. CONTRAST:  64m OMNIPAQUE IOHEXOL 300 MG/ML  SOLN COMPARISON:  CT neck December 17, 2017. FINDINGS: PHARYNX AND LARYNX: Retropharyngeal effusion without rim enhancement. Normal pharynx and larynx. SALIVARY GLANDS: Normal, however mass effect due to lymphadenopathy. THYROID: Normal. LYMPH NODES: Worsening bulky RIGHT lymphadenopathy. For example, RIGHT level 3-5 3.1 x 4 cm lymph node was 1.6 x 2.1 cm. RIGHT level 3 lymph node was 1.3 x 1.8 cm, now 3.1 x 3.5 cm. Multiple new RIGHT neck lymph nodes at levels 2, 3, 4 and 7. Superior mediastinal nodal conglomeration was 3 x 4.9 cm, now 3.9 x 6.2 cm with mild mass effect on the trachea. Partially imaged bulky RIGHT axillary lymphadenopathy. LEFT supraclavicular 2.6 x 3.9 cm lymph node was 1.7 x 3.1 cm. VASCULAR: Central thrombus RIGHT mid internal jugular vein is unchanged, compressed or occluded towards the brachiocephalic  confluence. LIMITED INTRACRANIAL: Normal. VISUALIZED ORBITS: Normal. MASTOIDS AND VISUALIZED PARANASAL SINUSES: RIGHT maxillary mucosal retention cysts without paranasal sinus air-fluid levels. Trace paranasal sinus mucosal thickening. Included mastoid air cells are well aerated. SKELETON: Nonacute. Severe LEFT temporomandibular osteoarthrosis. Poor dentition. 1 cm sclerotic lesion C6 is similar, potentially metastatic. UPPER CHEST: Lung apices are clear. No superior mediastinal lymphadenopathy. OTHER: Anterolateral RIGHT neck trans spatial edema with loss of RIGHT paraspinal muscle fat planes. Thickened RIGHT greater than LEFT platysma. Moderate to severe C5-6 degenerative disc. IMPRESSION: 1. Rapid disease progression: Increasing RIGHT greater than LEFT neck, axillary and mediastinal lymphadenopathy. Severe RIGHT anterolateral transspatial edema and retropharyngeal effusion resulting in tracheal displacement. Patent airway. 2. 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 3. Acute findings discussed with and reconfirmed by  Dr.ELIZABETH GOLDING on 01/16/2018 at 4:28 pm. Electronically Signed   By: Elon Alas M.D.   On: 01/16/2018 16:30    ASSESSMENT & PLAN:  52 y.o. male with  1. Metastatic lung adenocarcinoma with Right upper lobe noduleand extensive mediastinal and cervical lymphadenopathy -Biopsy consistent with poorly differentiated adenocarcinoma likely lung ( no imaging evidence of primary tumor other than lung cancer) Foundation One results reviewed - no targetable lung cancer mutation.(EGFR, ROS 1 BRAF and ALK gene rearrangement neg) CT  abd no overt disease. MRI brain neg for metastatic disease UGI series- no evidence of UGI malignancy  2. Metastatic cancer related hypercoagulable status   3. ExtensiveDeep venous thrombosis involving the right subclavian vein, right brachiocephalic vein, and right internal jugular vein- due to cancer + vascular surgery +  surgical site infection. -improved RUE swelling.  4. Rt proximal upper venous compression by bulky mediastinal tumor with impending SVC syndrome.  5. PAD with recent vascular bypass and rt foot gangrene. Rt grion infection -resolved s/p antibiotics.  6. Cancer related pain --- mx by palliative care  7. Neck and rt upper chest wall erythema and induration -- likely primarily due to radiation related skin toxicity.   8. Left groin would infection post-op - resolved  9. Anemia due to metastatic cancer, blood loss from recent surgery. Plan -transfuse prn for hgb <8 to tolerate tx and for wound healing   PLAN:  -Discussed pt labwork today, 02/13/18; HGB at 8.6 -Discussed my recommendation to biopsy the new mass seen at his left armpit given the previously seen poor differentiation, and the pt agrees. Will likely help with consideration for immunotherapy -The pt has no prohibitive toxicities from continuing weekly dose reduced Carboplatin and Taxol with concurrently with RT at this time.   -Pt will let me know if he feels more fatigued and I will set him up for 2 units PRBCs -Will see the pt back in 2 weeks -palliative care following for ongoing pain mx - pain better controlled on a methadone based regimen. -Discussed the intentions, possible benefits, and possible side effects of initiating palliative chemotherapy with the pt and his family and he will continue to consider this -Discussed that the intent of chemotherapy is palliative and not curative  -Again discussed the patient's goals of care, pt prefers to begin chemotherapy  -Continue following palliative care Dr. [name] for pain management -conitnue Fondaparinux for anticoagulation.   US guided biopsy of left axillary LN in 3-5 days Continue Weekly Carboplatin/Taxol weekly with labs - schedule next 2 doses RTC with Dr Irene Limbo in 2 weeks   All of the patients questions were answered with apparent satisfaction. The  patient knows to call the clinic with any problems, questions or concerns.  The total time spent in the appt was 25 minutes and more than 50% was on counseling and direct patient cares.    Sullivan Lone MD MS AAHIVMS [facility] Hematology/Oncology Physician [facility]  (Office):       [phone] (Work cell):  [phone] (Fax):           [phone]  02/13/2018 3:11 PM  I, Baldwin Jamaica, am acting as a scribe for Dr. Irene Limbo  .I have reviewed the above documentation for accuracy and completeness, and I agree with the above. Brunetta Genera MD

## 2018-02-13 ENCOUNTER — Inpatient Hospital Stay: Payer: BLUE CROSS/BLUE SHIELD | Attending: Hematology

## 2018-02-13 ENCOUNTER — Ambulatory Visit: Payer: BLUE CROSS/BLUE SHIELD

## 2018-02-13 ENCOUNTER — Ambulatory Visit
Admission: RE | Admit: 2018-02-13 | Discharge: 2018-02-13 | Disposition: A | Discharge status: Home / Self Care | Payer: BLUE CROSS/BLUE SHIELD | Source: Ambulatory Visit | Attending: Radiation Oncology | Admitting: Radiation Oncology

## 2018-02-13 ENCOUNTER — Inpatient Hospital Stay (HOSPITAL_BASED_OUTPATIENT_CLINIC_OR_DEPARTMENT_OTHER): Payer: BLUE CROSS/BLUE SHIELD | Admitting: Hematology

## 2018-02-13 ENCOUNTER — Inpatient Hospital Stay: Payer: BLUE CROSS/BLUE SHIELD

## 2018-02-13 ENCOUNTER — Other Ambulatory Visit: Payer: Self-pay

## 2018-02-13 VITALS — BP 138/85 | HR 103 | Temp 98.2°F | Resp 18 | Ht 72.0 in | Wt 164.5 lb

## 2018-02-13 DIAGNOSIS — I871 Compression of vein: Secondary | ICD-10-CM

## 2018-02-13 DIAGNOSIS — Z7901 Long term (current) use of anticoagulants: Secondary | ICD-10-CM | POA: Insufficient documentation

## 2018-02-13 DIAGNOSIS — I10 Essential (primary) hypertension: Secondary | ICD-10-CM | POA: Insufficient documentation

## 2018-02-13 DIAGNOSIS — D630 Anemia in neoplastic disease: Secondary | ICD-10-CM | POA: Insufficient documentation

## 2018-02-13 DIAGNOSIS — Z510 Encounter for antineoplastic radiation therapy: Secondary | ICD-10-CM | POA: Diagnosis not present

## 2018-02-13 DIAGNOSIS — F419 Anxiety disorder, unspecified: Secondary | ICD-10-CM

## 2018-02-13 DIAGNOSIS — Z7189 Other specified counseling: Secondary | ICD-10-CM

## 2018-02-13 DIAGNOSIS — G893 Neoplasm related pain (acute) (chronic): Secondary | ICD-10-CM | POA: Insufficient documentation

## 2018-02-13 DIAGNOSIS — Z87891 Personal history of nicotine dependence: Secondary | ICD-10-CM | POA: Diagnosis not present

## 2018-02-13 DIAGNOSIS — I739 Peripheral vascular disease, unspecified: Secondary | ICD-10-CM

## 2018-02-13 DIAGNOSIS — Z5111 Encounter for antineoplastic chemotherapy: Secondary | ICD-10-CM | POA: Diagnosis not present

## 2018-02-13 DIAGNOSIS — F329 Major depressive disorder, single episode, unspecified: Secondary | ICD-10-CM | POA: Diagnosis not present

## 2018-02-13 DIAGNOSIS — C3492 Malignant neoplasm of unspecified part of left bronchus or lung: Secondary | ICD-10-CM

## 2018-02-13 DIAGNOSIS — I82B11 Acute embolism and thrombosis of right subclavian vein: Secondary | ICD-10-CM | POA: Diagnosis not present

## 2018-02-13 DIAGNOSIS — C799 Secondary malignant neoplasm of unspecified site: Secondary | ICD-10-CM | POA: Diagnosis not present

## 2018-02-13 DIAGNOSIS — R110 Nausea: Secondary | ICD-10-CM | POA: Insufficient documentation

## 2018-02-13 LAB — CBC WITH DIFFERENTIAL (CANCER CENTER ONLY)
BASOS PCT: 1 %
Basophils Absolute: 0.1 10*3/uL (ref 0.0–0.1)
EOS ABS: 0 10*3/uL (ref 0.0–0.5)
Eosinophils Relative: 0 %
HEMATOCRIT: 26.8 % — AB (ref 38.4–49.9)
Hemoglobin: 8.6 g/dL — ABNORMAL LOW (ref 13.0–17.1)
LYMPHS ABS: 0 10*3/uL — AB (ref 0.9–3.3)
Lymphocytes Relative: 0 %
MCH: 28.5 pg (ref 27.2–33.4)
MCHC: 32 g/dL (ref 32.0–36.0)
MCV: 89.1 fL (ref 79.3–98.0)
MONO ABS: 0.3 10*3/uL (ref 0.1–0.9)
MONOS PCT: 4 %
NEUTROS ABS: 7.8 10*3/uL — AB (ref 1.5–6.5)
NEUTROS PCT: 95 %
PLATELETS: 294 10*3/uL (ref 140–400)
RBC: 3.01 MIL/uL — ABNORMAL LOW (ref 4.20–5.82)
RDW: 16.5 % — ABNORMAL HIGH (ref 11.0–14.6)
WBC Count: 8.2 10*3/uL (ref 4.0–10.3)

## 2018-02-13 LAB — CMP (CANCER CENTER ONLY)
ALBUMIN: 2.3 g/dL — AB (ref 3.5–5.0)
ALT: 26 U/L (ref 0–44)
AST: 22 U/L (ref 15–41)
Alkaline Phosphatase: 137 U/L — ABNORMAL HIGH (ref 38–126)
Anion gap: 10 (ref 5–15)
BUN: 15 mg/dL (ref 6–20)
CALCIUM: 9.7 mg/dL (ref 8.9–10.3)
CHLORIDE: 102 mmol/L (ref 98–111)
CO2: 26 mmol/L (ref 22–32)
CREATININE: 0.66 mg/dL (ref 0.61–1.24)
GFR, Estimated: >60 mL/min (ref >60)
GLUCOSE: 111 mg/dL — AB (ref 70–99)
Potassium: 5 mmol/L (ref 3.5–5.1)
SODIUM: 138 mmol/L (ref 135–145)
Total Bilirubin: <0.2 mg/dL — ABNORMAL LOW (ref 0.3–1.2)
Total Protein: 6.8 g/dL (ref 6.5–8.1)

## 2018-02-13 LAB — MAGNESIUM: MAGNESIUM: 1.9 mg/dL (ref 1.7–2.4)

## 2018-02-13 MED ORDER — SODIUM CHLORIDE 0.9 % IV SOLN
45.0000 mg/m2 | Freq: Once | INTRAVENOUS | Status: AC
  Administered 2018-02-13: 90 mg via INTRAVENOUS
  Filled 2018-02-13: qty 15

## 2018-02-13 MED ORDER — SODIUM CHLORIDE 0.9 % IV SOLN
276.2000 mg | Freq: Once | INTRAVENOUS | Status: AC
  Administered 2018-02-13: 280 mg via INTRAVENOUS
  Filled 2018-02-13: qty 28

## 2018-02-13 MED ORDER — SODIUM CHLORIDE 0.9 % IV SOLN
20.0000 mg | Freq: Once | INTRAVENOUS | Status: AC
  Administered 2018-02-13: 20 mg via INTRAVENOUS
  Filled 2018-02-13: qty 2

## 2018-02-13 MED ORDER — FAMOTIDINE IN NACL 20-0.9 MG/50ML-% IV SOLN
INTRAVENOUS | Status: AC
  Filled 2018-02-13: qty 50

## 2018-02-13 MED ORDER — DIPHENHYDRAMINE HCL 50 MG/ML IJ SOLN
50.0000 mg | Freq: Once | INTRAMUSCULAR | Status: AC
  Administered 2018-02-13: 50 mg via INTRAVENOUS

## 2018-02-13 MED ORDER — PALONOSETRON HCL INJECTION 0.25 MG/5ML
INTRAVENOUS | Status: AC
  Filled 2018-02-13: qty 5

## 2018-02-13 MED ORDER — PALONOSETRON HCL INJECTION 0.25 MG/5ML
0.2500 mg | Freq: Once | INTRAVENOUS | Status: AC
  Administered 2018-02-13: 0.25 mg via INTRAVENOUS

## 2018-02-13 MED ORDER — FAMOTIDINE IN NACL 20-0.9 MG/50ML-% IV SOLN
20.0000 mg | Freq: Once | INTRAVENOUS | Status: AC
  Administered 2018-02-13: 20 mg via INTRAVENOUS

## 2018-02-13 MED ORDER — DIPHENHYDRAMINE HCL 50 MG/ML IJ SOLN
INTRAMUSCULAR | Status: AC
  Filled 2018-02-13: qty 1

## 2018-02-13 MED ORDER — SODIUM CHLORIDE 0.9 % IV SOLN
Freq: Once | INTRAVENOUS | Status: AC
  Administered 2018-02-13: 15:00:00 via INTRAVENOUS
  Filled 2018-02-13: qty 250

## 2018-02-13 NOTE — Patient Instructions (Signed)
   [facility] Discharge Instructions for Patients Receiving Chemotherapy  Today you received the following chemotherapy agents Taxol and Carboplatin   To help prevent nausea and vomiting after your treatment, we encourage you to take your nausea medication as directed.    If you develop nausea and vomiting that is not controlled by your nausea medication, call the clinic.   BELOW ARE SYMPTOMS THAT SHOULD BE REPORTED IMMEDIATELY:  *FEVER GREATER THAN 100.5 F  *CHILLS WITH OR WITHOUT FEVER  NAUSEA AND VOMITING THAT IS NOT CONTROLLED WITH YOUR NAUSEA MEDICATION  *UNUSUAL SHORTNESS OF BREATH  *UNUSUAL BRUISING OR BLEEDING  TENDERNESS IN MOUTH AND THROAT WITH OR WITHOUT PRESENCE OF ULCERS  *URINARY PROBLEMS  *BOWEL PROBLEMS  UNUSUAL RASH Items with * indicate a potential emergency and should be followed up as soon as possible.  Feel free to call the clinic should you have any questions or concerns. The clinic phone number is [phone].  Please show the CHEMO ALERT CARD at check-in to the Emergency Department and triage nurse.   

## 2018-02-14 ENCOUNTER — Encounter: Payer: Self-pay | Admitting: *Deleted

## 2018-02-14 ENCOUNTER — Ambulatory Visit: Payer: BLUE CROSS/BLUE SHIELD

## 2018-02-14 ENCOUNTER — Encounter: Payer: Self-pay | Admitting: Vascular Surgery

## 2018-02-14 ENCOUNTER — Other Ambulatory Visit: Payer: Self-pay | Admitting: *Deleted

## 2018-02-14 ENCOUNTER — Other Ambulatory Visit: Payer: Self-pay

## 2018-02-14 ENCOUNTER — Ambulatory Visit
Admission: RE | Admit: 2018-02-14 | Discharge: 2018-02-14 | Disposition: A | Discharge status: Home / Self Care | Payer: BLUE CROSS/BLUE SHIELD | Source: Ambulatory Visit | Attending: Radiation Oncology | Admitting: Radiation Oncology

## 2018-02-14 ENCOUNTER — Ambulatory Visit (INDEPENDENT_AMBULATORY_CARE_PROVIDER_SITE_OTHER): Payer: BLUE CROSS/BLUE SHIELD | Admitting: Vascular Surgery

## 2018-02-14 ENCOUNTER — Encounter (HOSPITAL_COMMUNITY): Payer: Self-pay | Admitting: *Deleted

## 2018-02-14 ENCOUNTER — Other Ambulatory Visit: Payer: Self-pay | Admitting: Internal Medicine

## 2018-02-14 VITALS — BP 174/102 | HR 109 | Temp 97.8°F | Resp 20 | Ht 72.0 in | Wt 165.9 lb

## 2018-02-14 DIAGNOSIS — Z48812 Encounter for surgical aftercare following surgery on the circulatory system: Secondary | ICD-10-CM

## 2018-02-14 DIAGNOSIS — I871 Compression of vein: Secondary | ICD-10-CM

## 2018-02-14 DIAGNOSIS — C3492 Malignant neoplasm of unspecified part of left bronchus or lung: Secondary | ICD-10-CM

## 2018-02-14 DIAGNOSIS — Z510 Encounter for antineoplastic radiation therapy: Secondary | ICD-10-CM | POA: Diagnosis not present

## 2018-02-14 DIAGNOSIS — Z7189 Other specified counseling: Secondary | ICD-10-CM

## 2018-02-14 DIAGNOSIS — I82621 Acute embolism and thrombosis of deep veins of right upper extremity: Secondary | ICD-10-CM

## 2018-02-14 MED ORDER — DULOXETINE HCL 60 MG PO CPEP: 60.0000 mg | ORAL_CAPSULE | Freq: Every day | ORAL | 0 refills | Status: AC

## 2018-02-14 MED ORDER — OLANZAPINE 5 MG PO TABS: 5.0000 mg | ORAL_TABLET | Freq: Every day | ORAL | 0 refills | Status: AC

## 2018-02-14 MED ORDER — FLUCONAZOLE 100 MG PO TABS: 100.0000 mg | ORAL_TABLET | Freq: Every day | ORAL | 0 refills | Status: DC

## 2018-02-14 MED ORDER — ONDANSETRON HCL 8 MG PO TABS: 8.0000 mg | ORAL_TABLET | Freq: Two times a day (BID) | ORAL | 1 refills | Status: AC | PRN

## 2018-02-14 MED ORDER — METHOCARBAMOL 750 MG PO TABS: 750.0000 mg | ORAL_TABLET | Freq: Three times a day (TID) | ORAL | 0 refills | Status: DC

## 2018-02-14 MED ORDER — METHADONE HCL 10 MG PO TABS: 20.0000 mg | ORAL_TABLET | Freq: Three times a day (TID) | ORAL | 0 refills | Status: AC

## 2018-02-14 MED ORDER — OMEPRAZOLE 40 MG PO CPDR: 40.0000 mg | DELAYED_RELEASE_CAPSULE | Freq: Every day | ORAL | 0 refills | Status: DC

## 2018-02-14 MED ORDER — ONDANSETRON HCL 4 MG PO TABS: 4.0000 mg | ORAL_TABLET | Freq: Four times a day (QID) | ORAL | 0 refills | Status: DC | PRN

## 2018-02-14 MED ORDER — FONDAPARINUX SODIUM 7.5 MG/0.6ML ~~LOC~~ SOLN: 7.5000 mg | Freq: Every day | SUBCUTANEOUS | 0 refills | Status: AC

## 2018-02-14 MED ORDER — DRONABINOL 2.5 MG PO CAPS: 2.5000 mg | ORAL_CAPSULE | Freq: Two times a day (BID) | ORAL | 0 refills | Status: DC

## 2018-02-14 MED ORDER — SULFAMETHOXAZOLE-TRIMETHOPRIM 800-160 MG PO TABS: 1.0000 | ORAL_TABLET | Freq: Every day | ORAL | 0 refills | Status: DC

## 2018-02-14 MED ORDER — GABAPENTIN 400 MG PO CAPS: 400.0000 mg | ORAL_CAPSULE | Freq: Two times a day (BID) | ORAL | 0 refills | Status: DC

## 2018-02-14 NOTE — Progress Notes (Signed)
I spoke to Mrs Burgher, patient was sleeping,. Mrs Scalia reports that she has been given all the information for patient for months, " he is sleeping a lot and then he may not be as clear as he would be, he is on a lot of medications."  Mrs Elting states she has POA papers and will bring then in tomorrow.  Patient is on Arixtra, prescribed by oncologist, per Mrs Bozzo.  Patient received  1730 dose of Arixtra,  No instructions were given to hold it.

## 2018-02-14 NOTE — Progress Notes (Signed)
Refills provided. Followed by [place], DO Palliative Medicine

## 2018-02-14 NOTE — Progress Notes (Signed)
Patient name: Tony [name] MRN: [id number] DOB: 11/11/1965 Sex: male  REASON FOR VISIT:   Lymphatic drainage from left leg wound.  HPI:   Tony [name] is a pleasant 52 y.o. male with a complicated history.  He had presented with disabling pain in his right foot and had multilevel arterial occlusive disease.  He underwent an aortobifemoral bypass graft with a 14 x 7 mm Dacron graft and a right femoral to above-knee popliteal artery bypass with a vein graft on 11/15/2017.  On 12/01/2017 he occluded the left limb of his graft and underwent thrombectomy of the left limb of his bypass graft and a left femoral to above-knee popliteal artery bypass with a vein graft by Dr. Curt Jews.  Of note he is also being treated for a malignancy.  He has adenocarcinoma of the left lung which is stage IV.  Patient comes in with persistent clear drainage from the below the knee incision.  Given that this was a above-the-knee femoropopliteal bypass I suspect this incision is related to a vein harvest site.  Current Outpatient Medications  Medication Sig Dispense Refill  . acetaminophen (TYLENOL) 500 MG tablet Take 2 tablets (1,000 mg total) by mouth 3 (three) times daily. 30 tablet 0  . atorvastatin (LIPITOR) 40 MG tablet Take 1 tablet (40 mg total) by mouth at bedtime. 30 tablet 0  . belladonna-PHENObarbital (DONNATAL) 16.2 MG/5ML ELIX Take 10 mLs (32.4 mg total) by mouth 3 (three) times daily as needed for cramping or pain. 600 mL 0  . dexamethasone (DECADRON) 4 MG tablet Take 1 tablet (4 mg total) by mouth every 12 (twelve) hours. 60 tablet 0  . dexamethasone (DECADRON) 4 MG tablet Take 2 tablets (8 mg total) by mouth daily. Start the day after chemotherapy for 2 days. 30 tablet 1  . dronabinol (MARINOL) 2.5 MG capsule Take 1 capsule (2.5 mg total) by mouth 2 (two) times daily before lunch and supper. 60 capsule 0  . DULoxetine (CYMBALTA) 60 MG capsule Take 1 capsule (60 mg total) by mouth daily. 30 capsule  0  . fluconazole (DIFLUCAN) 100 MG tablet Take 1 tablet (100 mg total) by mouth daily. 30 tablet 0  . fondaparinux (ARIXTRA) 7.5 MG/0.6ML SOLN injection Inject 0.6 mLs (7.5 mg total) into the skin daily at 6 PM for 26 days. 15.6 mL 0  . gabapentin (NEURONTIN) 400 MG capsule Take 1 capsule (400 mg total) by mouth 2 (two) times daily. 60 capsule 0  . HYDROmorphone (DILAUDID) 8 MG tablet Take 1 tablet (8 mg total) by mouth every 2 (two) hours as needed for up to 14 days for severe pain (breakthrough pain, as directed). 168 tablet 0  . lidocaine (XYLOCAINE) 2 % solution Use as directed 15 mLs in the mouth or throat every 3 (three) hours as needed for up to 15 days for mouth pain (throat pain). 200 mL 0  . LORazepam (ATIVAN) 1 MG tablet Take 0.5 tablets (0.5 mg total) by mouth 2 (two) times daily AND 1 tablet (1 mg total) at bedtime. 60 tablet 0  . methadone (DOLOPHINE) 10 MG tablet Take 2 tablets (20 mg total) by mouth every 8 (eight) hours. 180 tablet 0  . methocarbamol (ROBAXIN) 750 MG tablet Take 1 tablet (750 mg total) by mouth 3 (three) times daily. 90 tablet 0  . metoprolol succinate (TOPROL-XL) 100 MG 24 hr tablet Take 100 mg by mouth at bedtime. Take with or immediately following a meal.    .  nutrition supplement, JUVEN, (JUVEN) PACK Take 1 packet by mouth 2 (two) times daily between meals. 60 packet 0  . OLANZapine (ZYPREXA) 5 MG tablet Take 1 tablet (5 mg total) by mouth at bedtime. 30 tablet 0  . omeprazole (PRILOSEC) 40 MG capsule Take 1 capsule (40 mg total) by mouth daily. 30 capsule 0  . ondansetron (ZOFRAN) 4 MG tablet Take 1 tablet (4 mg total) by mouth every 6 (six) hours as needed for nausea or vomiting. 20 tablet 0  . ondansetron (ZOFRAN) 8 MG tablet Take 1 tablet (8 mg total) by mouth 2 (two) times daily as needed for refractory nausea / vomiting. Start on day 3 after chemo. 30 tablet 1  . pantoprazole (PROTONIX) 40 MG tablet Take 1 tablet (40 mg total) by mouth daily. 30 tablet 0  .  polyethylene glycol (MIRALAX / GLYCOLAX) packet Take 17 g by mouth daily. 14 each 0  . povidone-iodine (BETADINE) 10 % external solution Apply topically as needed for wound care. 480 mL 0  . prochlorperazine (COMPAZINE) 10 MG tablet Take 1 tablet (10 mg total) by mouth every 6 (six) hours as needed (Nausea or vomiting). 30 tablet 1  . senna-docusate (SENOKOT-S) 8.6-50 MG tablet Take 2 tablets by mouth 2 (two) times daily. 30 tablet 0  . sulfamethoxazole-trimethoprim (BACTRIM DS,SEPTRA DS) 800-160 MG tablet Take 1 tablet by mouth daily. 30 tablet 0  . triamcinolone ointment (KENALOG) 0.5 % Apply 1 application topically 2 (two) times daily. Use at site of radiation burn 60 g 0   No current facility-administered medications for this visit.     REVIEW OF SYSTEMS:  [X]  denotes positive finding, [ ]  denotes negative finding Vascular    Leg swelling    Cardiac    Chest pain or chest pressure:    Shortness of breath upon exertion:    Short of breath when lying flat:    Irregular heart rhythm:    Constitutional    Fever or chills:     PHYSICAL EXAM:   Vitals:   02/14/18 0825 02/14/18 0828  BP: (!) 159/93 (!) 174/102  Pulse: (!) 109   Resp: 20   Temp: 97.8 F (36.6 C)   TempSrc: Oral   SpO2: 98%   Weight: 165 lb 14.4 oz (75.3 kg)   Height: 6' (1.829 m)     GENERAL: The patient is a well-nourished male, in no acute distress. The vital signs are documented above. CARDIOVASCULAR: There is a regular rate and rhythm. PULMONARY: There is good air exchange bilaterally without wheezing or rales. VASCULAR: He has palpable femoral pulses. The toes on the right foot are demarcating nicely without erythema or drainage. He has clear drainage from a very small hole in the below the knee incision on the left leg.  DATA:   No new data.  MEDICAL ISSUES:   PERSISTENT LYMPHATIC DRAINAGE FROM LEFT LEG INCISION BELOW THE KNEE: The patient has a very low pain threshold and I do not think this is  something we can address in the office.  I have recommended exploration in the operating room.  This may be something that is superficial and can simply be closed or potentially he would require placement of a VAC.  If he does have a VAC placed then we would have to arrange to have this changed by the home health nurse at home.  I have offered to do this tomorrow, Friday, however he has radiation therapy scheduled for tomorrow.  If we cannot  make these arrangements then we will have to schedule it for next week.  He shows no signs of infection.  There is no erythema this is simply a persistent lymphatic leak.  Deitra Mayo [facility] [phone]

## 2018-02-15 ENCOUNTER — Encounter (HOSPITAL_COMMUNITY): Payer: Self-pay | Admitting: Surgery

## 2018-02-15 ENCOUNTER — Encounter (HOSPITAL_COMMUNITY): Payer: Self-pay | Admitting: Certified Registered Nurse Anesthetist

## 2018-02-15 ENCOUNTER — Encounter (HOSPITAL_COMMUNITY)
Admission: RE | Disposition: A | Discharge status: Home / Self Care | Payer: Self-pay | Source: Ambulatory Visit | Attending: Vascular Surgery

## 2018-02-15 ENCOUNTER — Ambulatory Visit (HOSPITAL_COMMUNITY)
Admission: RE | Admit: 2018-02-15 | Discharge: 2018-02-15 | Disposition: A | Discharge status: Home / Self Care | Payer: BLUE CROSS/BLUE SHIELD | Source: Ambulatory Visit | Attending: Vascular Surgery | Admitting: Vascular Surgery

## 2018-02-15 ENCOUNTER — Ambulatory Visit: Payer: BLUE CROSS/BLUE SHIELD

## 2018-02-15 DIAGNOSIS — T8132XA Disruption of internal operation (surgical) wound, not elsewhere classified, initial encounter: Secondary | ICD-10-CM | POA: Diagnosis present

## 2018-02-15 DIAGNOSIS — Y832 Surgical operation with anastomosis, bypass or graft as the cause of abnormal reaction of the patient, or of later complication, without mention of misadventure at the time of the procedure: Secondary | ICD-10-CM | POA: Diagnosis not present

## 2018-02-15 DIAGNOSIS — Y9289 Other specified places as the place of occurrence of the external cause: Secondary | ICD-10-CM | POA: Insufficient documentation

## 2018-02-15 DIAGNOSIS — T8130XA Disruption of wound, unspecified, initial encounter: Secondary | ICD-10-CM | POA: Diagnosis not present

## 2018-02-15 DIAGNOSIS — Z539 Procedure and treatment not carried out, unspecified reason: Secondary | ICD-10-CM | POA: Diagnosis not present

## 2018-02-15 HISTORY — DX: Malignant (primary) neoplasm, unspecified: C80.1

## 2018-02-15 HISTORY — DX: Personal history of other medical treatment: Z92.89

## 2018-02-15 SURGERY — IRRIGATION AND DEBRIDEMENT EXTREMITY
Anesthesia: Choice

## 2018-02-15 MED ORDER — FENTANYL CITRATE (PF) 100 MCG/2ML IJ SOLN
INTRAMUSCULAR | Status: AC
  Administered 2018-02-15: 50 ug via INTRAVENOUS
  Filled 2018-02-15: qty 2

## 2018-02-15 MED ORDER — PROPOFOL 10 MG/ML IV BOLUS
INTRAVENOUS | Status: AC
  Filled 2018-02-15: qty 20

## 2018-02-15 MED ORDER — LACTATED RINGERS IV SOLN
INTRAVENOUS | Status: DC
  Administered 2018-02-15: 11:00:00 via INTRAVENOUS

## 2018-02-15 MED ORDER — FENTANYL CITRATE (PF) 250 MCG/5ML IJ SOLN
INTRAMUSCULAR | Status: AC
  Filled 2018-02-15: qty 5

## 2018-02-15 MED ORDER — GLYCOPYRROLATE PF 0.2 MG/ML IJ SOSY
PREFILLED_SYRINGE | INTRAMUSCULAR | Status: AC
  Filled 2018-02-15: qty 1

## 2018-02-15 MED ORDER — MIDAZOLAM HCL 2 MG/2ML IJ SOLN
INTRAMUSCULAR | Status: AC
  Filled 2018-02-15: qty 2

## 2018-02-15 MED ORDER — ONDANSETRON HCL 4 MG/2ML IJ SOLN
INTRAMUSCULAR | Status: AC
  Filled 2018-02-15: qty 2

## 2018-02-15 MED ORDER — SODIUM CHLORIDE 0.9 % IV SOLN: INTRAVENOUS | Status: DC

## 2018-02-15 MED ORDER — FENTANYL CITRATE (PF) 100 MCG/2ML IJ SOLN
50.0000 ug | Freq: Once | INTRAMUSCULAR | Status: AC
  Administered 2018-02-15 (×2): 50 ug via INTRAVENOUS

## 2018-02-15 MED ORDER — DEXAMETHASONE SODIUM PHOSPHATE 10 MG/ML IJ SOLN
INTRAMUSCULAR | Status: AC
  Filled 2018-02-15: qty 1

## 2018-02-15 MED ORDER — CEFAZOLIN SODIUM-DEXTROSE 2-4 GM/100ML-% IV SOLN
2.0000 g | INTRAVENOUS | Status: DC
  Filled 2018-02-15: qty 100

## 2018-02-15 MED ORDER — PHENYLEPHRINE 40 MCG/ML (10ML) SYRINGE FOR IV PUSH (FOR BLOOD PRESSURE SUPPORT)
PREFILLED_SYRINGE | INTRAVENOUS | Status: AC
  Filled 2018-02-15: qty 10

## 2018-02-15 SURGICAL SUPPLY — 30 items
BANDAGE ACE 4X5 VEL STRL LF (GAUZE/BANDAGES/DRESSINGS) IMPLANT
BANDAGE ACE 6X5 VEL STRL LF (GAUZE/BANDAGES/DRESSINGS) IMPLANT
BNDG GAUZE ELAST 4 BULKY (GAUZE/BANDAGES/DRESSINGS) IMPLANT
CANISTER SUCT 3000ML PPV (MISCELLANEOUS) ×3 IMPLANT
COVER SURGICAL LIGHT HANDLE (MISCELLANEOUS) ×3 IMPLANT
DRAPE HALF SHEET 40X57 (DRAPES) ×3 IMPLANT
DRAPE INCISE IOBAN 66X45 STRL (DRAPES) IMPLANT
DRAPE ORTHO SPLIT 77X108 STRL (DRAPES)
DRAPE SURG ORHT 6 SPLT 77X108 (DRAPES) IMPLANT
ELECT REM PT RETURN 9FT ADLT (ELECTROSURGICAL) ×2
ELECTRODE REM PT RTRN 9FT ADLT (ELECTROSURGICAL) ×2 IMPLANT
GAUZE SPONGE 4X4 12PLY STRL (GAUZE/BANDAGES/DRESSINGS) ×3 IMPLANT
GLOVE BIO SURGEON STRL SZ7.5 (GLOVE) ×3 IMPLANT
GLOVE BIOGEL PI IND STRL 8 (GLOVE) ×2 IMPLANT
GLOVE BIOGEL PI INDICATOR 8 (GLOVE) ×1
GOWN STRL REUS W/ TWL LRG LVL3 (GOWN DISPOSABLE) ×6 IMPLANT
GOWN STRL REUS W/TWL LRG LVL3 (GOWN DISPOSABLE) ×6
KIT BASIN OR (CUSTOM PROCEDURE TRAY) ×3 IMPLANT
KIT TURNOVER KIT B (KITS) ×3 IMPLANT
NS IRRIG 1000ML POUR BTL (IV SOLUTION) ×3 IMPLANT
PACK CV ACCESS (CUSTOM PROCEDURE TRAY) IMPLANT
PACK GENERAL/GYN (CUSTOM PROCEDURE TRAY) IMPLANT
PAD ARMBOARD 7.5X6 YLW CONV (MISCELLANEOUS) ×6 IMPLANT
SUT ETHILON 3 0 PS 1 (SUTURE) IMPLANT
SUT VIC AB 2-0 CTB1 (SUTURE) IMPLANT
SUT VIC AB 3-0 SH 27 (SUTURE)
SUT VIC AB 3-0 SH 27X BRD (SUTURE) IMPLANT
SUT VICRYL 4-0 PS2 18IN ABS (SUTURE) IMPLANT
TOWEL GREEN STERILE (TOWEL DISPOSABLE) ×3 IMPLANT
WATER STERILE IRR 1000ML POUR (IV SOLUTION) ×3 IMPLANT

## 2018-02-15 NOTE — Progress Notes (Signed)
Spoke with Dr. Scot Dock about patient stating he wanted to reschedule surgery.  Dr. Scot Dock stated he was getting ready to go into emergency surgery.  Wife patient will call office to have surgery rescheduled.

## 2018-02-15 NOTE — Anesthesia Preprocedure Evaluation (Deleted)
Anesthesia Evaluation  Patient identified by MRN, date of birth, ID band Patient awake    Reviewed: Allergy & Precautions, NPO status , Patient's Chart, lab work & pertinent test results  Airway        Dental   Pulmonary former smoker,           Cardiovascular hypertension,      Neuro/Psych    GI/Hepatic   Endo/Other    Renal/GU      Musculoskeletal   Abdominal   Peds  Hematology   Anesthesia Other Findings   Reproductive/Obstetrics                             Anesthesia Physical Anesthesia Plan  ASA: III  Anesthesia Plan: General   Post-op Pain Management:    Induction: Intravenous  PONV Risk Score and Plan: Ondansetron and Dexamethasone  Airway Management Planned: LMA  Additional Equipment:   Intra-op Plan:   Post-operative Plan:   Informed Consent: I have reviewed the patients History and Physical, chart, labs and discussed the procedure including the risks, benefits and alternatives for the proposed anesthesia with the patient or authorized representative who has indicated his/her understanding and acceptance.     Plan Discussed with: CRNA and Anesthesiologist  Anesthesia Plan Comments:         Anesthesia Quick Evaluation

## 2018-02-16 ENCOUNTER — Emergency Department (HOSPITAL_COMMUNITY)
Admission: EM | Admit: 2018-02-16 | Discharge: 2018-02-16 | Disposition: A | Discharge status: Home / Self Care | Payer: BLUE CROSS/BLUE SHIELD | Attending: Emergency Medicine | Admitting: Emergency Medicine

## 2018-02-16 ENCOUNTER — Other Ambulatory Visit: Payer: Self-pay

## 2018-02-16 ENCOUNTER — Emergency Department (HOSPITAL_COMMUNITY): Payer: BLUE CROSS/BLUE SHIELD

## 2018-02-16 ENCOUNTER — Encounter (HOSPITAL_COMMUNITY): Payer: Self-pay | Admitting: *Deleted

## 2018-02-16 DIAGNOSIS — Z9221 Personal history of antineoplastic chemotherapy: Secondary | ICD-10-CM | POA: Insufficient documentation

## 2018-02-16 DIAGNOSIS — R6883 Chills (without fever): Secondary | ICD-10-CM | POA: Diagnosis present

## 2018-02-16 DIAGNOSIS — I10 Essential (primary) hypertension: Secondary | ICD-10-CM | POA: Insufficient documentation

## 2018-02-16 DIAGNOSIS — Z79899 Other long term (current) drug therapy: Secondary | ICD-10-CM | POA: Insufficient documentation

## 2018-02-16 DIAGNOSIS — Z85118 Personal history of other malignant neoplasm of bronchus and lung: Secondary | ICD-10-CM | POA: Diagnosis not present

## 2018-02-16 DIAGNOSIS — Z87891 Personal history of nicotine dependence: Secondary | ICD-10-CM | POA: Diagnosis not present

## 2018-02-16 DIAGNOSIS — R0602 Shortness of breath: Secondary | ICD-10-CM | POA: Diagnosis not present

## 2018-02-16 LAB — COMPREHENSIVE METABOLIC PANEL
ALT: 23 U/L (ref 0–44)
AST: 24 U/L (ref 15–41)
Albumin: 2.5 g/dL — ABNORMAL LOW (ref 3.5–5.0)
Alkaline Phosphatase: 113 U/L (ref 38–126)
Anion gap: 11 (ref 5–15)
BUN: 19 mg/dL (ref 6–20)
CHLORIDE: 98 mmol/L (ref 98–111)
CO2: 28 mmol/L (ref 22–32)
Calcium: 8.9 mg/dL (ref 8.9–10.3)
Creatinine, Ser: 0.66 mg/dL (ref 0.61–1.24)
GFR calc non Af Amer: >60 mL/min (ref >60)
Glucose, Bld: 83 mg/dL (ref 70–99)
POTASSIUM: 4.6 mmol/L (ref 3.5–5.1)
Sodium: 137 mmol/L (ref 135–145)
Total Bilirubin: 0.4 mg/dL (ref 0.3–1.2)
Total Protein: 6.5 g/dL (ref 6.5–8.1)

## 2018-02-16 LAB — CBC WITH DIFFERENTIAL/PLATELET
Basophils Absolute: 0 10*3/uL (ref 0.0–0.1)
Basophils Relative: 0 %
EOS ABS: 0 10*3/uL (ref 0.0–0.7)
Eosinophils Relative: 0 %
HEMATOCRIT: 28.5 % — AB (ref 39.0–52.0)
Hemoglobin: 9.1 g/dL — ABNORMAL LOW (ref 13.0–17.0)
LYMPHS ABS: 0.3 10*3/uL — AB (ref 0.7–4.0)
Lymphocytes Relative: 4 %
MCH: 29.2 pg (ref 26.0–34.0)
MCHC: 31.9 g/dL (ref 30.0–36.0)
MCV: 91.3 fL (ref 78.0–100.0)
MONO ABS: 0 10*3/uL — AB (ref 0.1–1.0)
MONOS PCT: 0 %
NEUTROS ABS: 7.6 10*3/uL (ref 1.7–7.7)
NEUTROS PCT: 96 %
Platelets: 269 10*3/uL (ref 150–400)
RBC: 3.12 MIL/uL — ABNORMAL LOW (ref 4.22–5.81)
RDW: 16.4 % — ABNORMAL HIGH (ref 11.5–15.5)
WBC: 7.9 10*3/uL (ref 4.0–10.5)

## 2018-02-16 NOTE — ED Triage Notes (Signed)
Pt arrives via POV with his family. He c/o chills today, last took tylenol about 2330 for the same. He has also had some shortness of breath, some difficulty swallowing and irritation on the left tongue area.

## 2018-02-16 NOTE — ED Provider Notes (Signed)
[facility] DEPT Provider Note  CSN: [id number] Arrival date & time: 02/16/18 0040  Chief Complaint(s) Chills  HPI Tony [name] is a 52 y.o. male with a history of metastatic lung cancer with a neck mass currently undergoing radiation who started chemotherapy 2 weeks ago presents with worsening chills.  Patient and family report that he has had chills for a while but today's chills were more intense.  He denies any overt fevers.  Patient also complaining of intermittent neck spasms and difficulty breathing during these episodes.  They are brief and short lasting.  Patient reports that he gets anxious which makes this breathing worse.  Currently asymptomatic.  Family reports that the swelling to the right neck has improved with treatment.  States that the chills have resolved after taking Tylenol.  Patient endorses taking sputum and mild cough.  Denies any nausea or vomiting.  No difficulty swallowing.  No abdominal pain or diarrhea.  No urinary symptoms.  Patient and family also report gangrenous tissue of the right foot.  They state they he recently had a bypass surgery.  Foot appears to be improving.    HPI  Past Medical History Past Medical History:  Diagnosis Date  . Anxiety   . Arterial occlusive disease    multilevel arterial occlusive disease/notes 11/13/2017  . Arthritis    "left knee" (11/13/2017)  . Cancer ([place])    stage 4 lung cancer  . Chronic back pain   . Chronic lower back pain   . Depression   . Gastric ulcer due to Helicobacter pylori 5329  . GERD (gastroesophageal reflux disease)   . High cholesterol   . History of blood transfusion   . Hypertension   . IBS (irritable bowel syndrome)   . Mesenteric adenitis 2011   presumed/H&P  . Migraines    "use to suffer migraines years ago" (11/13/2017)  . Peripheral vascular disease ([place])   . Pneumonia 2011   "wife states he didn't have pneumonia"  . Ulcer    Patient Active Problem List   Diagnosis Date Noted  . Thrush of mouth and esophagus ([place]) 01/16/2018  . Counseling regarding advance care planning and goals of care 01/01/2018  . Adenocarcinoma ([place])   . SVC syndrome 12/31/2017  . Adenocarcinoma of left lung, stage 4 ([place]) 12/31/2017  . Mass in neck   . Neck pain due to malignant neoplastic disease ([place])   . Palliative care by specialist   . Uncontrolled pain   . Palliative care encounter   . Malignant neoplasm of upper lobe of right lung ([place]) 12/25/2017  . Gangrene of right foot ([place]) 12/20/2017  . AKI (acute kidney injury) ([place]) 12/20/2017  . PAD (peripheral artery disease) ([place]) 12/19/2017  . S/P aortobifemoral bypass surgery 12/17/2017  . Lung mass 12/17/2017  . Postoperative wound infection 12/17/2017  . Weight loss 12/17/2017  . Cervical lymphadenopathy 12/17/2017  . DVT (deep venous thrombosis) ([place]) 12/17/2017  . Aortoiliac occlusive disease ([place]) 11/15/2017  . Pain of right lower extremity due to ischemia 11/13/2017  . Hyperlipidemia LDL goal <70 11/13/2017  . PVD (peripheral vascular disease) ([place]) 11/11/2017  . H. pylori infection 10/11/2011  . Duodenal ulcer 08/23/2011  . IBS (irritable bowel syndrome) 08/18/2011  . Depression with anxiety 05/11/2009  . EPIGASTRIC PAIN, CHRONIC 05/11/2009  . SMOKER 09/01/2008  . INSOMNIA, CHRONIC 09/01/2008  . Essential hypertension 09/01/2008  . PALPITATIONS, CHRONIC 09/01/2008  . GASTRITIS 10/24/2007   Home Medication(s) Prior to Admission medications  Medication Sig Start Date End Date Taking? Authorizing Provider  acetaminophen (TYLENOL) 500 MG tablet Take 2 tablets (1,000 mg total) by mouth 3 (three) times daily. 01/23/18  Yes Acquanetta Chain, DO  atorvastatin (LIPITOR) 40 MG tablet Take 1 tablet (40 mg total) by mouth at bedtime. 01/23/18 02/22/18 Yes Acquanetta Chain, DO  belladonna-PHENObarbital (DONNATAL) 16.2 MG/5ML ELIX Take 10 mLs (32.4 mg total) by mouth 3 (three) times daily as needed for  cramping or pain. 01/31/18 03/02/18 Yes [name] L, DO  dexamethasone (DECADRON) 4 MG tablet Take 1 tablet (4 mg total) by mouth every 12 (twelve) hours. 01/23/18 02/22/18 Yes Acquanetta Chain, DO  dronabinol (MARINOL) 2.5 MG capsule Take 1 capsule (2.5 mg total) by mouth 2 (two) times daily before lunch and supper. 02/14/18 03/16/18 Yes Acquanetta Chain, DO  DULoxetine (CYMBALTA) 60 MG capsule Take 1 capsule (60 mg total) by mouth daily. 02/14/18 03/16/18 Yes Acquanetta Chain, DO  fluconazole (DIFLUCAN) 100 MG tablet Take 1 tablet (100 mg total) by mouth daily. 02/14/18 03/16/18 Yes Acquanetta Chain, DO  fondaparinux (ARIXTRA) 7.5 MG/0.6ML SOLN injection Inject 0.6 mLs (7.5 mg total) into the skin daily at 6 PM for 26 days. 02/14/18 03/12/18 Yes Acquanetta Chain, DO  gabapentin (NEURONTIN) 400 MG capsule Take 1 capsule (400 mg total) by mouth 2 (two) times daily. 02/14/18  Yes [name] L, DO  HYDROmorphone (DILAUDID) 8 MG tablet Take 1 tablet (8 mg total) by mouth every 2 (two) hours as needed for up to 14 days for severe pain (breakthrough pain, as directed). 02/03/18 02/17/18 Yes Acquanetta Chain, DO  LORazepam (ATIVAN) 1 MG tablet Take 0.5 tablets (0.5 mg total) by mouth 2 (two) times daily AND 1 tablet (1 mg total) at bedtime. 01/23/18 02/22/18 Yes Acquanetta Chain, DO  methadone (DOLOPHINE) 10 MG tablet Take 2 tablets (20 mg total) by mouth every 8 (eight) hours. 02/14/18 03/16/18 Yes Acquanetta Chain, DO  methocarbamol (ROBAXIN) 750 MG tablet Take 1 tablet (750 mg total) by mouth 3 (three) times daily. 02/14/18 03/16/18 Yes [name] L, DO  metoprolol succinate (TOPROL-XL) 100 MG 24 hr tablet Take 100 mg by mouth at bedtime. Take with or immediately following a meal.   Yes [provider]  OLANZapine (ZYPREXA) 5 MG tablet Take 1 tablet (5 mg total) by mouth at bedtime. 02/14/18 03/16/18 Yes Acquanetta Chain, DO  omeprazole (PRILOSEC) 40 MG capsule Take 1  capsule (40 mg total) by mouth daily. 02/14/18 03/16/18 Yes [name] L, DO  ondansetron (ZOFRAN) 8 MG tablet Take 1 tablet (8 mg total) by mouth 2 (two) times daily as needed for refractory nausea / vomiting. Start on day 3 after chemo. 02/14/18  Yes [name] L, DO  pantoprazole (PROTONIX) 40 MG tablet Take 1 tablet (40 mg total) by mouth daily. 01/24/18 02/23/18 Yes [name] L, DO  polyethylene glycol (MIRALAX / GLYCOLAX) packet Take 17 g by mouth daily. 01/24/18  Yes Patrecia Pour, Christean Grief, MD  povidone-iodine (BETADINE) 10 % external solution Apply topically as needed for wound care. Patient taking differently: Apply 1 application topically 3 (three) times daily as needed for wound care.  01/23/18  Yes Acquanetta Chain, DO  prochlorperazine (COMPAZINE) 10 MG tablet Take 1 tablet (10 mg total) by mouth every 6 (six) hours as needed (Nausea or vomiting). 02/05/18  Yes Brunetta Genera, MD  senna-docusate (SENOKOT-S) 8.6-50 MG tablet Take 2 tablets by mouth 2 (two)  times daily. 01/23/18  Yes Patrecia Pour, Christean Grief, MD  triamcinolone ointment (KENALOG) 0.5 % Apply 1 application topically 2 (two) times daily. Use at site of radiation burn 01/23/18 02/22/18 Yes [name] L, DO  dexamethasone (DECADRON) 4 MG tablet Take 2 tablets (8 mg total) by mouth daily. Start the day after chemotherapy for 2 days. Patient not taking: Reported on 02/16/2018 02/05/18   Brunetta Genera, MD  [facility] 4 MG/0.1ML LIQD nasal spray kit Place 1 spray into the nose daily as needed. Overdose of medication 01/23/18   [provider]  nutrition supplement, JUVEN, (JUVEN) PACK Take 1 packet by mouth 2 (two) times daily between meals. Patient not taking: Reported on 02/16/2018 01/23/18 02/22/18  Acquanetta Chain, DO  ondansetron (ZOFRAN) 4 MG tablet Take 1 tablet (4 mg total) by mouth every 6 (six) hours as needed for nausea or vomiting. Patient not taking: Reported on 02/16/2018 02/14/18   Acquanetta Chain, DO  sulfamethoxazole-trimethoprim (BACTRIM DS,SEPTRA DS) 800-160 MG tablet Take 1 tablet by mouth daily. 02/14/18 03/16/18  Acquanetta Chain, DO                                                                                                                                    Past Surgical History Past Surgical History:  Procedure Laterality Date  . AORTA - BILATERAL FEMORAL ARTERY BYPASS GRAFT Bilateral 11/15/2017   Procedure: AORTA BIFEMORAL BYPASS GRAFT;  Surgeon: Angelia Mould, MD;  Location: [facility];  Service: Vascular;  Laterality: Bilateral;  . BACK SURGERY  X 4   "procedure where they burn the nerves every 6 months"  . EMBOLECTOMY Left 12/01/2017   Procedure: THROMBECTOMY OF LEFT AORTAFEMORAL BYPASS, THROMBECTOMY OF TIBIAL ARTERY;  Surgeon: Rosetta Posner, MD;  Location: [facility] OR;  Service: Vascular;  Laterality: Left;  . ESOPHAGOGASTRODUODENOSCOPY  08/22/2011   Procedure: ESOPHAGOGASTRODUODENOSCOPY (EGD);  Surgeon: [name], MD;  Location: [facility];  Service: Gastroenterology;  Laterality: N/A;  . FEMORAL-POPLITEAL BYPASS GRAFT Right 11/15/2017   Procedure: Right FEMORAL-Above knee POPLITEAL ARTERY Bypass Graft using nonreversed saphenous vein ;  Surgeon: Angelia Mould, MD;  Location: [facility];  Service: Vascular;  Laterality: Right;  . FEMORAL-POPLITEAL BYPASS GRAFT Left 12/01/2017   Procedure: BYPASS GRAFT FEMORAL- ABOVE KNEE POPLITEAL ARTERY WITH VEIN;  Surgeon: Rosetta Posner, MD;  Location: [facility];  Service: Vascular;  Laterality: Left;  . INTRAOPERATIVE ARTERIOGRAM Right 11/15/2017   Procedure: INTRA OPERATIVE ARTERIOGRAM;  Surgeon: Angelia Mould, MD;  Location: [facility];  Service: Vascular;  Laterality: Right;  . INTRAOPERATIVE ARTERIOGRAM Left 12/01/2017   Procedure: INTRA OPERATIVE ARTERIOGRAM OF LEFT LEG;  Surgeon: Rosetta Posner, MD;  Location: [facility];  Service: Vascular;  Laterality: Left;  . IR US GUIDE BX ASP/DRAIN  12/18/2017  . KNEE ARTHROSCOPY Left  X 5   Family History Family History  Problem Relation Age of Onset  .  Hypertension Mother   . Heart disease Mother   . Hypertension Father   . Coronary artery disease Father 89  . Heart attack Brother 72  . Coronary artery disease Brother   . Heart disease Brother   . Diabetes Neg Hx   . Stroke Neg Hx     Social History Social History   Tobacco Use  . Smoking status: Former Smoker    Packs/day: 1.00    Years: 24.00    Pack years: 24.00    Types: Cigarettes    Start date: 11/28/2017    Last attempt to quit: 12/04/2017    Years since quitting: 0.2  . Smokeless tobacco: Never Used  Substance Use Topics  . Alcohol use: Not Currently  . Drug use: Not Currently    Types: Marijuana    Comment: 11/13/2017  "last drug use was in ~ 1987 "   Allergies Pork-derived products; Shellfish-derived products; and Morphine and related  Review of Systems Review of Systems All other systems are reviewed and are negative for acute change except as noted in the HPI  Physical Exam Vital Signs  I have reviewed the triage vital signs BP (!) 145/78 (BP Location: Right Arm)   Pulse 92   Temp 98.8 F (37.1 C) (Oral)   Resp 18   SpO2 97%   Physical Exam  Constitutional: He is oriented to person, place, and time. He appears well-developed and well-nourished. No distress.  HENT:  Head: Normocephalic and atraumatic.  Nose: Nose normal.  Eyes: Pupils are equal, round, and reactive to light. Conjunctivae and EOM are normal. Right eye exhibits no discharge. Left eye exhibits no discharge. No scleral icterus.  Neck: Normal range of motion. Neck supple.    Cardiovascular: Normal rate and regular rhythm. Exam reveals no gallop and no friction rub.  No murmur heard. Pulmonary/Chest: Effort normal and breath sounds normal. No accessory muscle usage or stridor. No tachypnea and no bradypnea. No respiratory distress. He has no wheezes. He has no rhonchi. He has no rales.  Abdominal: Soft. He exhibits  no distension. There is no tenderness.  Musculoskeletal: He exhibits no edema or tenderness.  Neurological: He is alert and oriented to person, place, and time.  Skin: Skin is warm and dry. No rash noted. He is not diaphoretic. No erythema.     Psychiatric: He has a normal mood and affect.  Vitals reviewed.   ED Results and Treatments Labs (all labs ordered are listed, but only abnormal results are displayed) Labs Reviewed  CBC WITH DIFFERENTIAL/PLATELET - Abnormal; Notable for the following components:      Result Value   RBC 3.12 (*)    Hemoglobin 9.1 (*)    HCT 28.5 (*)    RDW 16.4 (*)    Lymphs Abs 0.3 (*)    Monocytes Absolute 0.0 (*)    All other components within normal limits  COMPREHENSIVE METABOLIC PANEL - Abnormal; Notable for the following components:   Albumin 2.5 (*)    All other components within normal limits  EKG  EKG Interpretation  Date/Time:    Ventricular Rate:    PR Interval:    QRS Duration:   QT Interval:    QTC Calculation:   R Axis:     Text Interpretation:        Radiology Dg Chest 2 View  Result Date: 02/16/2018 CLINICAL DATA:  Lung cancer, former smoker with dyspnea and chills today. EXAM: CHEST - 2 VIEW COMPARISON:  01/07/2018 chest CT FINDINGS: Redemonstration of right paratracheal soft tissue prominence compatible with known mediastinal lymphadenopathy. Midline trachea. No pulmonary consolidation. Lung volumes are slightly low with crowding of interstitial lung markings. No aggressive osseous lesions. Heart is top-normal in size. Nonaneurysmal thoracic aorta. IMPRESSION: 1. Low lung volumes with crowding of interstitial lung markings. No alveolar consolidations. 2. Known right paratracheal soft tissue prominence consistent with lymphadenopathy. No significant change. Electronically Signed   By: Ashley Royalty M.D.   On:  02/16/2018 02:59   Pertinent labs & imaging results that were available during my care of the patient were reviewed by me and considered in my medical decision making (see chart for details).  Medications Ordered in ED Medications - No data to display                                                                                                                                  Procedures Procedures  (including critical care time)  Medical Decision Making / ED Course I have reviewed the nursing notes for this encounter and the patient's prior records (if available in EHR or on provided paperwork).    Patient is afebrile with stable vital signs.  He is tachycardic but upon review of records, patient has a high resting heart rate between the high 100s and 110s.  Lungs clear to auscultation bilaterally.  Per family right neck mass is improving.  Right foot gangrene is improving.  Abdomen benign.  Screening labs obtained at patient's baseline and grossly reassuring.  Chest x-ray negative.  Able to tolerate oral intake.  No obvious source of infection at this time.  Recommended close monitoring.  Patient already has close follow-up with oncologist in 2 days.  The patient appears reasonably screened and/or stabilized for discharge and I doubt any other medical condition or other [facility] requiring further screening, evaluation, or treatment in the ED at this time prior to discharge.  The patient is safe for discharge with strict return precautions.   Final Clinical Impression(s) / ED Diagnoses Final diagnoses:  SOB (shortness of breath)  Chills    Disposition: Discharge  Condition: Good  I have discussed the results, Dx and Tx plan with the patient and family who expressed understanding and agree(s) with the plan. Discharge instructions discussed at great length. The patient was given strict return precautions who verbalized understanding of the instructions. No further questions at time  of discharge.    ED Discharge Orders    None  Follow Up: [name], MD [facility] [place] [address] [place] [address] [phone]  Schedule an appointment as soon as possible for a visit  As needed     This chart was dictated using voice recognition software.  Despite best efforts to proofread,  errors can occur which can change the documentation meaning.   Fatima Blank, MD 02/16/18 [phone]

## 2018-02-18 ENCOUNTER — Ambulatory Visit: Payer: BLUE CROSS/BLUE SHIELD

## 2018-02-18 ENCOUNTER — Ambulatory Visit
Admission: RE | Admit: 2018-02-18 | Discharge: 2018-02-18 | Disposition: A | Discharge status: Home / Self Care | Payer: BLUE CROSS/BLUE SHIELD | Source: Ambulatory Visit | Attending: Radiation Oncology | Admitting: Radiation Oncology

## 2018-02-18 DIAGNOSIS — Z510 Encounter for antineoplastic radiation therapy: Secondary | ICD-10-CM | POA: Diagnosis not present

## 2018-02-19 ENCOUNTER — Ambulatory Visit
Admission: RE | Admit: 2018-02-19 | Discharge: 2018-02-19 | Disposition: A | Discharge status: Home / Self Care | Payer: BLUE CROSS/BLUE SHIELD | Source: Ambulatory Visit | Attending: Radiation Oncology | Admitting: Radiation Oncology

## 2018-02-19 DIAGNOSIS — Z510 Encounter for antineoplastic radiation therapy: Secondary | ICD-10-CM | POA: Diagnosis not present

## 2018-02-20 ENCOUNTER — Other Ambulatory Visit: Payer: Self-pay

## 2018-02-20 ENCOUNTER — Inpatient Hospital Stay: Payer: BLUE CROSS/BLUE SHIELD

## 2018-02-20 ENCOUNTER — Ambulatory Visit
Admission: RE | Admit: 2018-02-20 | Discharge: 2018-02-20 | Disposition: A | Discharge status: Home / Self Care | Payer: BLUE CROSS/BLUE SHIELD | Source: Ambulatory Visit | Attending: Radiation Oncology | Admitting: Radiation Oncology

## 2018-02-20 ENCOUNTER — Other Ambulatory Visit: Payer: Self-pay | Admitting: Hematology

## 2018-02-20 ENCOUNTER — Telehealth: Payer: Self-pay

## 2018-02-20 ENCOUNTER — Inpatient Hospital Stay (HOSPITAL_BASED_OUTPATIENT_CLINIC_OR_DEPARTMENT_OTHER): Payer: BLUE CROSS/BLUE SHIELD | Admitting: Hematology

## 2018-02-20 DIAGNOSIS — I82290 Acute embolism and thrombosis of other thoracic veins: Secondary | ICD-10-CM

## 2018-02-20 DIAGNOSIS — D6869 Other thrombophilia: Secondary | ICD-10-CM | POA: Diagnosis not present

## 2018-02-20 DIAGNOSIS — C3492 Malignant neoplasm of unspecified part of left bronchus or lung: Secondary | ICD-10-CM

## 2018-02-20 DIAGNOSIS — B370 Candidal stomatitis: Secondary | ICD-10-CM

## 2018-02-20 DIAGNOSIS — L538 Other specified erythematous conditions: Secondary | ICD-10-CM

## 2018-02-20 DIAGNOSIS — I82C11 Acute embolism and thrombosis of right internal jugular vein: Secondary | ICD-10-CM

## 2018-02-20 DIAGNOSIS — C799 Secondary malignant neoplasm of unspecified site: Secondary | ICD-10-CM | POA: Diagnosis not present

## 2018-02-20 DIAGNOSIS — Z510 Encounter for antineoplastic radiation therapy: Secondary | ICD-10-CM | POA: Diagnosis not present

## 2018-02-20 DIAGNOSIS — I739 Peripheral vascular disease, unspecified: Secondary | ICD-10-CM

## 2018-02-20 DIAGNOSIS — I871 Compression of vein: Secondary | ICD-10-CM

## 2018-02-20 DIAGNOSIS — D630 Anemia in neoplastic disease: Secondary | ICD-10-CM

## 2018-02-20 DIAGNOSIS — L03115 Cellulitis of right lower limb: Secondary | ICD-10-CM

## 2018-02-20 DIAGNOSIS — I96 Gangrene, not elsewhere classified: Secondary | ICD-10-CM

## 2018-02-20 DIAGNOSIS — R6883 Chills (without fever): Secondary | ICD-10-CM

## 2018-02-20 DIAGNOSIS — G893 Neoplasm related pain (acute) (chronic): Secondary | ICD-10-CM

## 2018-02-20 DIAGNOSIS — I82B11 Acute embolism and thrombosis of right subclavian vein: Secondary | ICD-10-CM

## 2018-02-20 LAB — CMP (CANCER CENTER ONLY)
ALK PHOS: 122 U/L (ref 38–126)
ALT: 15 U/L (ref 0–44)
ANION GAP: 11 (ref 5–15)
AST: 17 U/L (ref 15–41)
Albumin: 2.3 g/dL — ABNORMAL LOW (ref 3.5–5.0)
BUN: 17 mg/dL (ref 6–20)
CO2: 26 mmol/L (ref 22–32)
Calcium: 9.6 mg/dL (ref 8.9–10.3)
Chloride: 100 mmol/L (ref 98–111)
Creatinine: 0.67 mg/dL (ref 0.61–1.24)
Glucose, Bld: 84 mg/dL (ref 70–99)
Potassium: 4.5 mmol/L (ref 3.5–5.1)
Sodium: 137 mmol/L (ref 135–145)
TOTAL PROTEIN: 6.8 g/dL (ref 6.5–8.1)

## 2018-02-20 LAB — CBC WITH DIFFERENTIAL/PLATELET
BASOS ABS: 0 10*3/uL (ref 0.0–0.1)
BASOS PCT: 0 %
EOS ABS: 0 10*3/uL (ref 0.0–0.5)
Eosinophils Relative: 0 %
HCT: 26.9 % — ABNORMAL LOW (ref 38.4–49.9)
Hemoglobin: 8.2 g/dL — ABNORMAL LOW (ref 13.0–17.1)
Lymphocytes Relative: 1 %
Lymphs Abs: 0.1 10*3/uL — ABNORMAL LOW (ref 0.9–3.3)
MCH: 27.8 pg (ref 27.2–33.4)
MCHC: 30.5 g/dL — ABNORMAL LOW (ref 32.0–36.0)
MCV: 91.2 fL (ref 79.3–98.0)
Monocytes Absolute: 0.2 10*3/uL (ref 0.1–0.9)
Monocytes Relative: 4 %
NEUTROS ABS: 5.2 10*3/uL (ref 1.5–6.5)
NEUTROS PCT: 95 %
PLATELETS: 219 10*3/uL (ref 140–400)
RBC: 2.95 MIL/uL — ABNORMAL LOW (ref 4.20–5.82)
RDW: 16.2 % — ABNORMAL HIGH (ref 11.0–14.6)
WBC: 5.5 10*3/uL (ref 4.0–10.3)

## 2018-02-20 MED ORDER — DOXYCYCLINE HYCLATE 100 MG PO TABS: 100.0000 mg | ORAL_TABLET | Freq: Two times a day (BID) | ORAL | 0 refills | Status: DC

## 2018-02-20 NOTE — Progress Notes (Signed)
Dr. Irene Limbo saw pt in infusion room today.  No chemo today as per MD.  Both pt and wife voiced understanding.

## 2018-02-20 NOTE — Progress Notes (Signed)
HEMATOLOGY/ONCOLOGY CLINIC NOTE  Date of Service: 02/26/2018  Patient Care Team: [name], MD as PCP - General (Family Medicine) Brunetta Genera, MD as Medical Oncologist (Hematology)  CHIEF COMPLAINTS/PURPOSE OF CONSULTATION:  Metastatic Lung Adenocarcinoma  HISTORY OF PRESENTING ILLNESS:  Tony [name] is a wonderful 52 y.o. male who has been referred to [place] by Dr. Durenda Hurt for evaluation, management and followup for newly diagnosed metastatic poorly differentiated adenocarcinoma likely of lung primary. Upper GI series showed no overt esophageal or gastric tumor . Has been seen by radiation oncology and was transferred to [facility] long and today started on palliative radiation for pain control and for impending SVC syndrome due to bulky disease in his chest. He has completed antibiotics for his left groin infection and the wound incisions of healed. Still having significant right upper extremity swelling.  Has extensive bilateral cervical and mediastinal lymphadenopathy. Continues to be on anticoagulation with fondaparinux for his DVT. We discussed the lab studies imaging findings and the role for palliative chemotherapy in the setting.  He is agreeable to this and would like to pursue an aggressive course of treatment understanding that it might not be curative. We discussed starting him on weekly carboplatin + Taxol while on concurrent RT. Pending foundation one and PDL1 testing results   Interval History:   Tony [name] returns today for management, evaluation, and C3 Carbo/taxol treatment of his adenocarcinoma of the left lung. The patient's last visit with [place] was on 02/13/18. He is accompanied today by his wife. The pt reports that he is not feeling very well today.   The pt presented to the ED on 02/16/18 for chills, nose bleeds, and SOB. He notes that his right foot with gangrenous changes has been more painful in the last several days. He completes radiation on  02/22/18 and has nearly lost his voice. The pt also notes that he has had a cough, sore throat and mouth sores.   Lab results today (02/20/18) of CBC w/diff, CMP is as follows: all values are WNL except for RBC at 2.95, HGB at 8.2, HCT at 26.9, MCHC at 30.5, RDW at 16.2, Lymphs abs at 100, Albumin at 2.3, Total Bilirubin at <0.2.  On review of systems, pt reports chills, recent nose bleeds, right foot pain, weak voice, mouth sores, cough, and denies and any other symptoms.      MEDICAL HISTORY:  Past Medical History:  Diagnosis Date  . Anxiety   . Arterial occlusive disease    multilevel arterial occlusive disease/notes 11/13/2017  . Arthritis    "left knee" (11/13/2017)  . Cancer ([place])    stage 4 lung cancer  . Chronic back pain   . Chronic lower back pain   . Depression   . Gastric ulcer due to Helicobacter pylori 4665  . GERD (gastroesophageal reflux disease)   . High cholesterol   . History of blood transfusion   . Hypertension   . IBS (irritable bowel syndrome)   . Mesenteric adenitis 2011   presumed/H&P  . Migraines    "use to suffer migraines years ago" (11/13/2017)  . Peripheral vascular disease ([place])   . Pneumonia 2011   "wife states he didn't have pneumonia"  . Ulcer     SURGICAL HISTORY: Past Surgical History:  Procedure Laterality Date  . AORTA - BILATERAL FEMORAL ARTERY BYPASS GRAFT Bilateral 11/15/2017   Procedure: AORTA BIFEMORAL BYPASS GRAFT;  Surgeon: Angelia Mould, MD;  Location: [place];  Service:  Vascular;  Laterality: Bilateral;  . BACK SURGERY  X 4   "procedure where they burn the nerves every 6 months"  . EMBOLECTOMY Left 12/01/2017   Procedure: THROMBECTOMY OF LEFT AORTAFEMORAL BYPASS, THROMBECTOMY OF TIBIAL ARTERY;  Surgeon: Rosetta Posner, MD;  Location[facility] OR;  Service: Vascular;  Laterality: Left;  . ESOPHAGOGASTRODUODENOSCOPY  08/22/2011   Procedure: ESOPHAGOGASTRODUODENOSCOPY (EGD);  Surgeon: [name], MD;  Location: [place];  Service:  Gastroenterology;  Laterality: N/A;  . FEMORAL-POPLITEAL BYPASS GRAFT Right 11/15/2017   Procedure: Right FEMORAL-Above knee POPLITEAL ARTERY Bypass Graft using nonreversed saphenous vein ;  Surgeon: Angelia Mould, MD;  Location: [place];  Service: Vascular;  Laterality: Right;  . FEMORAL-POPLITEAL BYPASS GRAFT Left 12/01/2017   Procedure: BYPASS GRAFT FEMORAL- ABOVE KNEE POPLITEAL ARTERY WITH VEIN;  Surgeon: Rosetta Posner, MD;  Location: [place];  Service: Vascular;  Laterality: Left;  . INTRAOPERATIVE ARTERIOGRAM Right 11/15/2017   Procedure: INTRA OPERATIVE ARTERIOGRAM;  Surgeon: Angelia Mould, MD;  Location: [place];  Service: Vascular;  Laterality: Right;  . INTRAOPERATIVE ARTERIOGRAM Left 12/01/2017   Procedure: INTRA OPERATIVE ARTERIOGRAM OF LEFT LEG;  Surgeon: Rosetta Posner, MD;  Location: [place];  Service: Vascular;  Laterality: Left;  . IR US GUIDE BX ASP/DRAIN  12/18/2017  . KNEE ARTHROSCOPY Left X 5    SOCIAL HISTORY: Social History   Socioeconomic History  . Marital status: Married    Spouse name: Not on file  . Number of children: 3  . Years of education: Not on file  . Highest education level: Not on file  Occupational History  . Occupation: Admin. Assistant    Employer: [employer]  Social Needs  . Financial resource strain: Not on file  . Food insecurity:    Worry: Not on file    Inability: Not on file  . Transportation needs:    Medical: Not on file    Non-medical: Not on file  Tobacco Use  . Smoking status: Former Smoker    Packs/day: 1.00    Years: 24.00    Pack years: 24.00    Types: Cigarettes    Start date: 11/28/2017    Last attempt to quit: 12/04/2017    Years since quitting: 0.2  . Smokeless tobacco: Never Used  Substance and Sexual Activity  . Alcohol use: Not Currently  . Drug use: Not Currently    Types: Marijuana    Comment: 11/13/2017  "last drug use was in ~ 1987 "  . Sexual activity: Not on file  Lifestyle  . Physical activity:     Days per week: Not on file    Minutes per session: Not on file  . Stress: Not on file  Relationships  . Social connections:    Talks on phone: Not on file    Gets together: Not on file    Attends religious service: Not on file    Active member of club or organization: Not on file    Attends meetings of clubs or organizations: Not on file    Relationship status: Not on file  . Intimate partner violence:    Fear of current or ex partner: Not on file    Emotionally abused: Not on file    Physically abused: Not on file    Forced sexual activity: Not on file  Other Topics Concern  . Not on file  Social History Narrative   Lives with wife and two children.  Administrator at [employer]  FAMILY HISTORY: Family History  Problem Relation Age of Onset  . Hypertension Mother   . Heart disease Mother   . Hypertension Father   . Coronary artery disease Father 62  . Heart attack Brother 33  . Coronary artery disease Brother   . Heart disease Brother   . Diabetes Neg Hx   . Stroke Neg Hx     ALLERGIES:  is allergic to pork-derived products; shellfish-derived products; and morphine and related.  MEDICATIONS:  No current facility-administered medications for this visit.    No current outpatient medications on file.   Facility-Administered Medications Ordered in Other Visits  Medication Dose Route Frequency Provider Last Rate Last Dose  . 0.9 %  sodium chloride infusion   Intravenous PRN Kayleen Memos, DO   Stopped at 02/26/18 2234  . acetaminophen (TYLENOL) tablet 650 mg  650 mg Oral Q6H PRN Doutova, Anastassia, MD       Or  . acetaminophen (TYLENOL) suppository 650 mg  650 mg Rectal Q6H PRN Toy Baker, MD   650 mg at 02/26/18 1555  . albuterol (PROVENTIL) (2.5 MG/3ML) 0.083% nebulizer solution 2.5 mg  2.5 mg Nebulization Q4H PRN Irene Pap N, DO   2.5 mg at 02/26/18 1654  . bisacodyl (DULCOLAX) suppository 10 mg  10 mg Rectal Daily PRN Pershing Proud, NP      . ceFEPIme  (MAXIPIME) 1 g in sodium chloride 0.9 % 100 mL IVPB  1 g Intravenous Q8H Doutova, Anastassia, MD 200 mL/hr at 02/26/18 2308 1 g at 02/26/18 2308  . [START ON 02/27/2018] Chlorhexidine Gluconate Cloth 2 % PADS 6 each  6 each Topical Q0600 Irene Pap N, DO      . dexamethasone (DECADRON) injection 4 mg  4 mg Intravenous Daily Pershing Proud, NP   4 mg at 02/26/18 2015  . dextrose 5 %-0.45 % sodium chloride infusion   Intravenous Continuous Irene Pap N, DO 50 mL/hr at 02/26/18 2300    . DULoxetine (CYMBALTA) DR capsule 60 mg  60 mg Oral Daily Doutova, Anastassia, MD      . famotidine (PEPCID) IVPB 20 mg premix  20 mg Intravenous O84Z Pershing Proud, NP   Stopped at 02/26/18 2158  . [START ON 02/27/2018] fluconazole (DIFLUCAN) IVPB 100 mg  100 mg Intravenous Q24H Irene Pap N, DO      . fondaparinux (ARIXTRA) injection 7.5 mg  7.5 mg Subcutaneous q1800 Doutova, Anastassia, MD   7.5 mg at 02/26/18 1746  . gabapentin (NEURONTIN) capsule 400 mg  400 mg Oral BID Toy Baker, MD   400 mg at 02/26/18 0207  . HYDROmorphone (DILAUDID) injection 1 mg  1 mg Intravenous Y6A PRN Vinie Sill C, NP      . HYDROmorphone (DILAUDID) injection 1 mg  1 mg Intravenous Y3K Pershing Proud, NP   1 mg at 02/26/18 2017  . ipratropium-albuterol (DUONEB) 0.5-2.5 (3) MG/3ML nebulizer solution 3 mL  3 mL Nebulization TID Irene Pap N, DO   3 mL at 02/26/18 1949  . LORazepam (ATIVAN) injection 0.5 mg  0.5 mg Intravenous Z6W PRN Pershing Proud, NP      . magic mouthwash  5-10 mL Oral BID Toy Baker, MD      . methocarbamol (ROBAXIN) 500 mg in dextrose 5 % 50 mL IVPB  500 mg Intravenous BID Pershing Proud, NP   Stopped at 02/26/18 2302  . metoprolol tartrate (LOPRESSOR) injection 2.5 mg  2.5 mg Intravenous TID [place] Crane,  Lorenda Cahill, DO   2.5 mg at 02/26/18 2219  . metroNIDAZOLE (FLAGYL) IVPB 500 mg  500 mg Intravenous Q8H Toy Baker, MD   Stopped at 02/26/18 1845  . mupirocin ointment (BACTROBAN)  2 % 1 application  1 application Nasal BID Kayleen Memos, DO   1 application at 70/26/37 2225  . OLANZapine zydis (ZYPREXA) disintegrating tablet 5 mg  5 mg Oral QHS Hall, Carole N, DO   5 mg at 02/26/18 2223  . ondansetron (ZOFRAN) tablet 4 mg  4 mg Oral Q6H PRN Doutova, Anastassia, MD       Or  . ondansetron (ZOFRAN) injection 4 mg  4 mg Intravenous Q6H PRN Doutova, Anastassia, MD      . polyethylene glycol (MIRALAX / GLYCOLAX) packet 17 g  17 g Oral Daily Doutova, Anastassia, MD   17 g at 02/26/18 1114  . senna-docusate (Senokot-S) tablet 2 tablet  2 tablet Oral BID Toy Baker, MD   Stopped at 02/26/18 0429  . sucralfate (CARAFATE) 1 GM/10ML suspension 1 g  1 g Oral TID WC & HS Vinie Sill C, NP      . thiamine (B-1) injection 100 mg  100 mg Intravenous Daily Doutova, Anastassia, MD   100 mg at 02/26/18 1112  . triamcinolone cream (KENALOG) 0.1 %   Topical BID Irene Pap N, DO      . vancomycin (VANCOCIN) 1,750 mg in sodium chloride 0.9 % 500 mL IVPB  1,750 mg Intravenous Q24H Toy Baker, MD   Stopped at 02/26/18 2221    REVIEW OF SYSTEMS:    A 10+ POINT REVIEW OF SYSTEMS WAS OBTAINED including neurology, dermatology, psychiatry, cardiac, respiratory, lymph, extremities, GI, GU, Musculoskeletal, constitutional, breasts, reproductive, HEENT.  All pertinent positives are noted in the HPI.  All others are negative.   PHYSICAL EXAMINATION: ECOG PERFORMANCE STATUS: 2 - Symptomatic, <50% confined to bed  There were no vitals filed for this visit. There were no vitals filed for this visit. .There is no height or weight on file to calculate BMI.  GENERAL:alert, in no acute distress and comfortable SKIN: grade 2 radiation skin injury over right neck and right upper chest EYES: conjunctiva are pink and non-injected, sclera anicteric OROPHARYNX: MMM, no exudates, no oropharyngeal erythema or ulceration NECK: b/l cervical LNadenopathy R>>L improved, improved erythema over  neck, supraclavicular area and chest wall with decreased skin edema LYMPH:  Progressive palpable b/l cervical lymphadenopathy, b/l axillary LNadenopathy LUNGS: clear to auscultation b/l with normal respiratory effort HEART: regular rate & rhythm ABDOMEN:  normoactive bowel sounds , non tender, not distended. No palpable hepatosplenomegaly.  Extremity: Resolved edema in RUE and lower extremities. Rt foot gangrenous changes. Rt groin wound healed with no discharge at this time.  PSYCH: alert & oriented x 3 with fluent speech NEURO: no focal motor/sensory deficits   LABORATORY DATA:  I have reviewed the data as listed  . CBC Latest Ref Rng & Units 02/26/2018 02/25/2018 02/20/2018  WBC 4.0 - 10.5 K/uL 6.9 7.5 5.5  Hemoglobin 13.0 - 17.0 g/dL 7.8(L) 9.6(L) 8.2(L)  Hematocrit 39.0 - 52.0 % 24.7(L) 31.1(L) 26.9(L)  Platelets 150 - 400 K/uL 171 228 219    . CMP Latest Ref Rng & Units 02/26/2018 02/25/2018 02/20/2018  Glucose 70 - 99 mg/dL 77 79 84  BUN 6 - 20 mg/dL 17 28(H) 17  Creatinine 0.61 - 1.24 mg/dL 0.78 1.00 0.67  Sodium 135 - 145 mmol/L 136 140 137  Potassium 3.5 - 5.1 mmol/L  3.9 4.1 4.5  Chloride 98 - 111 mmol/L 103 100 100  CO2 22 - 32 mmol/L 19(L) 25 26  Calcium 8.9 - 10.3 mg/dL 8.4(L) 9.6 9.6  Total Protein 6.5 - 8.1 g/dL 6.1(L) 7.3 6.8  Total Bilirubin 0.3 - 1.2 mg/dL 1.0 0.9 <0.2(L)  Alkaline Phos 38 - 126 U/L 106 132(H) 122  AST 15 - 41 U/L _0 ALT 0 - 44 U/L _1 12/18/17 Soft tissue biopsy:    12/18/17 Foundation One Results:      RADIOGRAPHIC STUDIES: I have personally reviewed the radiological images as listed and agreed with the findings in the report. Dg Chest 2 View  Result Date: 02/25/2018 CLINICAL DATA:  Cough with fever and chills EXAM: CHEST - 2 VIEW COMPARISON:  Chest CT January 07, 2018; chest radiograph December 16, 2017 FINDINGS: There remains adenopathy throughout the right azygos and paratracheal regions, similar to most recent study. There is no  edema or consolidation. Heart size and pulmonary vascularity are normal. No evident bone lesions. IMPRESSION: Right-sided adenopathy, unchanged from recent study. Other areas of adenopathy seen on prior CT are not appreciable by radiography. No edema or consolidation. Previously noted mass in the posterior segment right upper lobe on chest CT is not appreciable by radiography. Heart size normal. Electronically Signed   By: Lowella Grip III M.D.   On: 02/25/2018 14:12   Dg Chest 2 View  Result Date: 02/16/2018 CLINICAL DATA:  Lung cancer, former smoker with dyspnea and chills today. EXAM: CHEST - 2 VIEW COMPARISON:  01/07/2018 chest CT FINDINGS: Redemonstration of right paratracheal soft tissue prominence compatible with known mediastinal lymphadenopathy. Midline trachea. No pulmonary consolidation. Lung volumes are slightly low with crowding of interstitial lung markings. No aggressive osseous lesions. Heart is top-normal in size. Nonaneurysmal thoracic aorta. IMPRESSION: 1. Low lung volumes with crowding of interstitial lung markings. No alveolar consolidations. 2. Known right paratracheal soft tissue prominence consistent with lymphadenopathy. No significant change. Electronically Signed   By: Ashley Royalty M.D.   On: 02/16/2018 02:59   Ct Angio Chest Pe W And/or Wo Contrast  Result Date: 02/25/2018 CLINICAL DATA:  Productive cough. Metastatic cancer with involvement of the right upper lobe EXAM: CT ANGIOGRAPHY CHEST WITH CONTRAST TECHNIQUE: Multidetector CT imaging of the chest was performed using the standard protocol during bolus administration of intravenous contrast. Multiplanar CT image reconstructions and MIPs were obtained to evaluate the vascular anatomy. CONTRAST:  162m ISOVUE-370 IOPAMIDOL (ISOVUE-370) INJECTION 76% COMPARISON:  Multiple exams, including 01/07/2018 FINDINGS: Despite efforts by the technologist and patient, motion artifact is present on today's exam and could not be  eliminated. This reduces exam sensitivity and specificity. Cardiovascular: No filling defect is identified in the pulmonary arterial tree to suggest pulmonary embolus. There is narrowing of the right upper lobe pulmonary artery due to extrinsic mass effect from tumor. Difficult to be certain that the tumor is not invading the right upper lobe pulmonary artery given the complete 360 degree engulfing of the vessel by the tumor, but I do not see obvious filling defect within the narrowed segment of pulmonary artery traversing the tumor. The contour is also fairly similar to 01/07/18. Mediastinum/Nodes: Bulky right paratracheal tumor as before, measuring up to 4.6 cm in short axis, formerly 4.8 cm. A prevascular node measures 1.6 cm in short axis on image 44/6, previously 2.1 cm. Right hilar node 1.9 cm in short axis on image 50/6, formerly 2.0 cm. Conglomerate right supraclavicular adenopathy  difficult to separate from the sternocleidomastoid muscle, less sharply defined but mildly reduced in overall size compared to prior exam. A left supraclavicular node measures 2.2 cm in short axis on image 9/6, formerly the same. Bilateral pathologic axillary adenopathy. An index left axillary node measures 2.5 cm in short axis on image 31/6, significantly worsened, previously 0.9 cm. Right axillary and subpectoral adenopathy to prior. Lungs/Pleura: Paraseptal emphysema. The right upper lobe lung nodule measures up to 0.8 cm in short axis on image 43/12, previously 1.3 cm by my measurements. New ground-glass density band in the right upper lobe for example on image 55/12, likely from alveolitis, less likely a new focus of low-grade adenocarcinoma. Small amount of frothy fluid in the trachea and right mainstem bronchus. Upper Abdomen: Unremarkable Musculoskeletal: Unremarkable Review of the MIP images confirms the above findings. IMPRESSION: 1. No filling defect is identified in the pulmonary arterial tree to suggest pulmonary  embolus. Stable degree of mass effect on the right upper lobe pulmonary artery by the right mediastinal tumor. 2. Mixed appearance of the tumor burden, on balance the tumor burden is mildly reduced. Specifically, the bulky mediastinal tumor is mildly reduced in size; the right upper lobe bandlike pulmonary nodule is reduced in size; and mediastinal and right supraclavicular adenopathy is mildly reduced. However, left axillary adenopathy is significantly increased. 3. Other imaging findings of potential clinical significance: Emphysema (ICD10-J43.9). Mild focus of alveolitis in the right upper lobe. Electronically Signed   By: Van Clines M.D.   On: 02/25/2018 18:21   Dg Foot Complete Right  Result Date: 02/25/2018 CLINICAL DATA:  Necrosis of the great toe, second toe and third toe. EXAM: RIGHT FOOT COMPLETE - 3+ VIEW COMPARISON:  None. FINDINGS: Pronounced deficiency of the soft tissue of the distal toes, most notable at the second and third toes. No plain radiographic evidence of osteomyelitis. No recent fracture. Old healed fracture of the proximal phalanx of the fifth toe. IMPRESSION: Pronounced soft tissue deficiency of the distal toes. No plain radiographic evidence of osteomyelitis. Electronically Signed   By: Nelson Chimes M.D.   On: 02/25/2018 16:56    ASSESSMENT & PLAN:  52 y.o. male with  1. Metastatic lung adenocarcinoma with Right upper lobe noduleand extensive mediastinal and cervical lymphadenopathy -Biopsy consistent with poorly differentiated adenocarcinoma likely lung ( no imaging evidence of primary tumor other than lung cancer) Foundation One results reviewed - no targetable lung cancer mutation.(EGFR, ROS 1 BRAF and ALK gene rearrangement neg) CT  abd no overt disease. MRI brain neg for metastatic disease UGI series- no evidence of UGI malignancy  2. Metastatic cancer related hypercoagulable status   3. ExtensiveDeep venous thrombosis involving the right subclavian  vein, right brachiocephalic vein, and right internal jugular vein- due to cancer + vascular surgery + surgical site infection. -improved RUE swelling.  4. Rt proximal upper venous compression by bulky mediastinal tumor with impending SVC syndrome.  5. PAD with recent vascular bypass and rt foot gangrene. Rt grion infection -resolved s/p antibiotics.  6. Cancer related pain --- mx by palliative care  7. Neck and rt upper chest wall erythema and induration -- likely primarily due to radiation related skin toxicity.   8. Left groin would infection post-op - resolved  9. Anemia due to metastatic cancer, blood loss from recent surgery. Plan -transfuse prn for hgb <8 to tolerate tx and for wound healing   PLAN:  -Discussed pt labwork today, 02/20/18; HGB at 8.5, blood counts and chemistries are stable -  Saline solution for nose  -Begin Nystatin for thrush -Begin Doxycycline for gangrenous infection of right foot -Discussed that the pt's current symptoms are concerning for infection and are prohibitive from the pt receiving chemotherapy today -Will tentatively reschedule pt again in one week -palliative care following for ongoing pain mx - pain better controlled on a methadone based regimen. -Continue following palliative care Dr. [name] for pain management -conitnue Fondaparinux for anticoagulation.   US guided biopsy of left axillary LN in 3-5 days Continue Weekly Carboplatin/Taxol weekly with labs - schedule next 2 doses RTC with Dr Irene Limbo in 2 weeks   All of the patients questions were answered with apparent satisfaction. The patient knows to call the clinic with any problems, questions or concerns.  . The total time spent in the appointment was 20 minutes and more than 50% was on counseling and direct patient cares.      Sullivan Lone MD MS AAHIVMS [facility] Hematology/Oncology Physician [facility]  (Office):       [phone] (Work cell):   [phone] (Fax):           [phone]   I, Baldwin Jamaica, am acting as a scribe for Dr. Irene Limbo  .I have reviewed the above documentation for accuracy and completeness, and I agree with the above. Brunetta Genera MD

## 2018-02-20 NOTE — Telephone Encounter (Signed)
Per 9/11 los / scheduled

## 2018-02-21 ENCOUNTER — Ambulatory Visit: Payer: BLUE CROSS/BLUE SHIELD

## 2018-02-21 ENCOUNTER — Ambulatory Visit
Admission: RE | Admit: 2018-02-21 | Discharge: 2018-02-21 | Disposition: A | Discharge status: Home / Self Care | Payer: BLUE CROSS/BLUE SHIELD | Source: Ambulatory Visit | Attending: Radiation Oncology | Admitting: Radiation Oncology

## 2018-02-21 DIAGNOSIS — Z510 Encounter for antineoplastic radiation therapy: Secondary | ICD-10-CM | POA: Diagnosis not present

## 2018-02-22 ENCOUNTER — Encounter: Payer: Self-pay | Admitting: Radiation Oncology

## 2018-02-22 ENCOUNTER — Ambulatory Visit
Admission: RE | Admit: 2018-02-22 | Discharge: 2018-02-22 | Disposition: A | Discharge status: Home / Self Care | Payer: BLUE CROSS/BLUE SHIELD | Source: Ambulatory Visit | Attending: Radiation Oncology | Admitting: Radiation Oncology

## 2018-02-22 ENCOUNTER — Other Ambulatory Visit: Payer: Self-pay | Admitting: Internal Medicine

## 2018-02-22 DIAGNOSIS — Z510 Encounter for antineoplastic radiation therapy: Secondary | ICD-10-CM | POA: Diagnosis not present

## 2018-02-22 MED ORDER — LORAZEPAM 1 MG PO TABS: ORAL_TABLET | ORAL | 3 refills | Status: AC

## 2018-02-22 MED ORDER — PANTOPRAZOLE SODIUM 40 MG PO TBEC: 40.0000 mg | DELAYED_RELEASE_TABLET | Freq: Every day | ORAL | 3 refills | Status: AC

## 2018-02-22 MED ORDER — GABAPENTIN 400 MG PO CAPS: 400.0000 mg | ORAL_CAPSULE | Freq: Two times a day (BID) | ORAL | 3 refills | Status: AC

## 2018-02-22 MED ORDER — DEXAMETHASONE 4 MG PO TABS: 4.0000 mg | ORAL_TABLET | Freq: Every day | ORAL | 3 refills | Status: AC

## 2018-02-22 NOTE — Progress Notes (Unsigned)
Refilled medications. Followed by [facility].

## 2018-02-25 ENCOUNTER — Emergency Department (HOSPITAL_COMMUNITY): Payer: BLUE CROSS/BLUE SHIELD

## 2018-02-25 ENCOUNTER — Ambulatory Visit (HOSPITAL_COMMUNITY): Payer: BLUE CROSS/BLUE SHIELD

## 2018-02-25 ENCOUNTER — Other Ambulatory Visit: Payer: Self-pay

## 2018-02-25 ENCOUNTER — Encounter (HOSPITAL_COMMUNITY): Payer: Self-pay

## 2018-02-25 ENCOUNTER — Inpatient Hospital Stay (HOSPITAL_COMMUNITY)
Admission: EM | Admit: 2018-02-25 | Discharge: 2018-03-01 | DRG: 872 | Disposition: A | Discharge status: Home / Self Care | Payer: BLUE CROSS/BLUE SHIELD | Attending: Internal Medicine | Admitting: Internal Medicine

## 2018-02-25 ENCOUNTER — Ambulatory Visit (HOSPITAL_BASED_OUTPATIENT_CLINIC_OR_DEPARTMENT_OTHER): Payer: BLUE CROSS/BLUE SHIELD

## 2018-02-25 DIAGNOSIS — M79604 Pain in right leg: Secondary | ICD-10-CM | POA: Diagnosis present

## 2018-02-25 DIAGNOSIS — I249 Acute ischemic heart disease, unspecified: Secondary | ICD-10-CM | POA: Diagnosis present

## 2018-02-25 DIAGNOSIS — I7409 Other arterial embolism and thrombosis of abdominal aorta: Secondary | ICD-10-CM | POA: Diagnosis not present

## 2018-02-25 DIAGNOSIS — K0889 Other specified disorders of teeth and supporting structures: Secondary | ICD-10-CM | POA: Diagnosis present

## 2018-02-25 DIAGNOSIS — C7800 Secondary malignant neoplasm of unspecified lung: Secondary | ICD-10-CM

## 2018-02-25 DIAGNOSIS — B370 Candidal stomatitis: Secondary | ICD-10-CM | POA: Diagnosis present

## 2018-02-25 DIAGNOSIS — Z79891 Long term (current) use of opiate analgesic: Secondary | ICD-10-CM

## 2018-02-25 DIAGNOSIS — Z885 Allergy status to narcotic agent status: Secondary | ICD-10-CM

## 2018-02-25 DIAGNOSIS — K219 Gastro-esophageal reflux disease without esophagitis: Secondary | ICD-10-CM | POA: Diagnosis present

## 2018-02-25 DIAGNOSIS — I82B11 Acute embolism and thrombosis of right subclavian vein: Secondary | ICD-10-CM | POA: Diagnosis present

## 2018-02-25 DIAGNOSIS — R6520 Severe sepsis without septic shock: Secondary | ICD-10-CM | POA: Diagnosis present

## 2018-02-25 DIAGNOSIS — C3411 Malignant neoplasm of upper lobe, right bronchus or lung: Secondary | ICD-10-CM | POA: Diagnosis present

## 2018-02-25 DIAGNOSIS — R339 Retention of urine, unspecified: Secondary | ICD-10-CM | POA: Diagnosis present

## 2018-02-25 DIAGNOSIS — L0390 Cellulitis, unspecified: Secondary | ICD-10-CM | POA: Diagnosis present

## 2018-02-25 DIAGNOSIS — T2007XA Burn of unspecified degree of neck, initial encounter: Secondary | ICD-10-CM | POA: Diagnosis present

## 2018-02-25 DIAGNOSIS — F05 Delirium due to known physiological condition: Secondary | ICD-10-CM | POA: Diagnosis not present

## 2018-02-25 DIAGNOSIS — A419 Sepsis, unspecified organism: Secondary | ICD-10-CM | POA: Diagnosis not present

## 2018-02-25 DIAGNOSIS — F419 Anxiety disorder, unspecified: Secondary | ICD-10-CM | POA: Diagnosis present

## 2018-02-25 DIAGNOSIS — E785 Hyperlipidemia, unspecified: Secondary | ICD-10-CM | POA: Diagnosis present

## 2018-02-25 DIAGNOSIS — F17211 Nicotine dependence, cigarettes, in remission: Secondary | ICD-10-CM | POA: Diagnosis present

## 2018-02-25 DIAGNOSIS — Z515 Encounter for palliative care: Secondary | ICD-10-CM | POA: Diagnosis present

## 2018-02-25 DIAGNOSIS — E860 Dehydration: Secondary | ICD-10-CM | POA: Diagnosis not present

## 2018-02-25 DIAGNOSIS — Y842 Radiological procedure and radiotherapy as the cause of abnormal reaction of the patient, or of later complication, without mention of misadventure at the time of the procedure: Secondary | ICD-10-CM | POA: Diagnosis present

## 2018-02-25 DIAGNOSIS — K589 Irritable bowel syndrome without diarrhea: Secondary | ICD-10-CM | POA: Diagnosis present

## 2018-02-25 DIAGNOSIS — Z7189 Other specified counseling: Secondary | ICD-10-CM | POA: Diagnosis not present

## 2018-02-25 DIAGNOSIS — M79609 Pain in unspecified limb: Secondary | ICD-10-CM

## 2018-02-25 DIAGNOSIS — R221 Localized swelling, mass and lump, neck: Secondary | ICD-10-CM | POA: Diagnosis present

## 2018-02-25 DIAGNOSIS — M7989 Other specified soft tissue disorders: Secondary | ICD-10-CM

## 2018-02-25 DIAGNOSIS — L03221 Cellulitis of neck: Secondary | ICD-10-CM | POA: Diagnosis present

## 2018-02-25 DIAGNOSIS — I82C11 Acute embolism and thrombosis of right internal jugular vein: Secondary | ICD-10-CM | POA: Diagnosis present

## 2018-02-25 DIAGNOSIS — A4102 Sepsis due to Methicillin resistant Staphylococcus aureus: Secondary | ICD-10-CM | POA: Diagnosis present

## 2018-02-25 DIAGNOSIS — N179 Acute kidney failure, unspecified: Secondary | ICD-10-CM | POA: Diagnosis present

## 2018-02-25 DIAGNOSIS — Z95828 Presence of other vascular implants and grafts: Secondary | ICD-10-CM

## 2018-02-25 DIAGNOSIS — I82290 Acute embolism and thrombosis of other thoracic veins: Secondary | ICD-10-CM | POA: Diagnosis present

## 2018-02-25 DIAGNOSIS — B3781 Candidal esophagitis: Secondary | ICD-10-CM | POA: Diagnosis present

## 2018-02-25 DIAGNOSIS — K5900 Constipation, unspecified: Secondary | ICD-10-CM | POA: Diagnosis present

## 2018-02-25 DIAGNOSIS — F329 Major depressive disorder, single episode, unspecified: Secondary | ICD-10-CM | POA: Diagnosis present

## 2018-02-25 DIAGNOSIS — E440 Moderate protein-calorie malnutrition: Secondary | ICD-10-CM | POA: Diagnosis present

## 2018-02-25 DIAGNOSIS — C7802 Secondary malignant neoplasm of left lung: Secondary | ICD-10-CM | POA: Diagnosis present

## 2018-02-25 DIAGNOSIS — Z7901 Long term (current) use of anticoagulants: Secondary | ICD-10-CM

## 2018-02-25 DIAGNOSIS — Z79899 Other long term (current) drug therapy: Secondary | ICD-10-CM

## 2018-02-25 DIAGNOSIS — M1712 Unilateral primary osteoarthritis, left knee: Secondary | ICD-10-CM | POA: Diagnosis present

## 2018-02-25 DIAGNOSIS — D509 Iron deficiency anemia, unspecified: Secondary | ICD-10-CM | POA: Diagnosis present

## 2018-02-25 DIAGNOSIS — I10 Essential (primary) hypertension: Secondary | ICD-10-CM | POA: Diagnosis present

## 2018-02-25 DIAGNOSIS — I998 Other disorder of circulatory system: Secondary | ICD-10-CM | POA: Diagnosis present

## 2018-02-25 DIAGNOSIS — I871 Compression of vein: Secondary | ICD-10-CM | POA: Diagnosis present

## 2018-02-25 DIAGNOSIS — G893 Neoplasm related pain (acute) (chronic): Secondary | ICD-10-CM | POA: Diagnosis present

## 2018-02-25 DIAGNOSIS — D638 Anemia in other chronic diseases classified elsewhere: Secondary | ICD-10-CM | POA: Diagnosis present

## 2018-02-25 DIAGNOSIS — R52 Pain, unspecified: Secondary | ICD-10-CM | POA: Diagnosis not present

## 2018-02-25 DIAGNOSIS — M545 Low back pain: Secondary | ICD-10-CM | POA: Diagnosis present

## 2018-02-25 DIAGNOSIS — I96 Gangrene, not elsewhere classified: Secondary | ICD-10-CM | POA: Diagnosis present

## 2018-02-25 DIAGNOSIS — Z86718 Personal history of other venous thrombosis and embolism: Secondary | ICD-10-CM

## 2018-02-25 DIAGNOSIS — Z8249 Family history of ischemic heart disease and other diseases of the circulatory system: Secondary | ICD-10-CM

## 2018-02-25 DIAGNOSIS — Z8619 Personal history of other infectious and parasitic diseases: Secondary | ICD-10-CM

## 2018-02-25 DIAGNOSIS — Z6821 Body mass index (BMI) 21.0-21.9, adult: Secondary | ICD-10-CM

## 2018-02-25 LAB — CBC WITH DIFFERENTIAL/PLATELET
BASOS PCT: 0 %
Basophils Absolute: 0 10*3/uL (ref 0.0–0.1)
EOS ABS: 0 10*3/uL (ref 0.0–0.7)
EOS PCT: 0 %
HEMATOCRIT: 31.1 % — AB (ref 39.0–52.0)
Hemoglobin: 9.6 g/dL — ABNORMAL LOW (ref 13.0–17.0)
Lymphocytes Relative: 3 %
Lymphs Abs: 0.2 10*3/uL — ABNORMAL LOW (ref 0.7–4.0)
MCH: 28.1 pg (ref 26.0–34.0)
MCHC: 30.9 g/dL (ref 30.0–36.0)
MCV: 90.9 fL (ref 78.0–100.0)
MONO ABS: 0.2 10*3/uL (ref 0.1–1.0)
MONOS PCT: 3 %
NEUTROS ABS: 7 10*3/uL (ref 1.7–7.7)
Neutrophils Relative %: 94 %
Platelets: 228 10*3/uL (ref 150–400)
RBC: 3.42 MIL/uL — ABNORMAL LOW (ref 4.22–5.81)
RDW: 17.2 % — AB (ref 11.5–15.5)
WBC: 7.5 10*3/uL (ref 4.0–10.5)

## 2018-02-25 LAB — COMPREHENSIVE METABOLIC PANEL
ALT: 18 U/L (ref 0–44)
AST: 22 U/L (ref 15–41)
Albumin: 2.7 g/dL — ABNORMAL LOW (ref 3.5–5.0)
Alkaline Phosphatase: 132 U/L — ABNORMAL HIGH (ref 38–126)
Anion gap: 15 (ref 5–15)
BUN: 28 mg/dL — AB (ref 6–20)
CALCIUM: 9.6 mg/dL (ref 8.9–10.3)
CO2: 25 mmol/L (ref 22–32)
CREATININE: 1 mg/dL (ref 0.61–1.24)
Chloride: 100 mmol/L (ref 98–111)
GFR calc non Af Amer: >60 mL/min (ref >60)
GLUCOSE: 79 mg/dL (ref 70–99)
Potassium: 4.1 mmol/L (ref 3.5–5.1)
SODIUM: 140 mmol/L (ref 135–145)
Total Bilirubin: 0.9 mg/dL (ref 0.3–1.2)
Total Protein: 7.3 g/dL (ref 6.5–8.1)

## 2018-02-25 LAB — URINALYSIS, ROUTINE W REFLEX MICROSCOPIC
Bilirubin Urine: NEGATIVE
Glucose, UA: NEGATIVE mg/dL
Hgb urine dipstick: NEGATIVE
KETONES UR: 20 mg/dL — AB
Leukocytes, UA: NEGATIVE
Nitrite: NEGATIVE
PH: 5 (ref 5.0–8.0)
PROTEIN: 30 mg/dL — AB
Specific Gravity, Urine: >1.046 — ABNORMAL HIGH (ref 1.005–1.030)

## 2018-02-25 LAB — BLOOD GAS, VENOUS
ACID-BASE DEFICIT: 2.9 mmol/L — AB (ref 0.0–2.0)
BICARBONATE: 20.6 mmol/L (ref 20.0–28.0)
O2 Saturation: 84.6 %
PATIENT TEMPERATURE: 98.6
PO2 VEN: 54.1 mmHg — AB (ref 32.0–45.0)
pCO2, Ven: 33 mmHg — ABNORMAL LOW (ref 44.0–60.0)
pH, Ven: 7.412 (ref 7.250–7.430)

## 2018-02-25 LAB — LACTIC ACID, PLASMA: Lactic Acid, Venous: 0.8 mmol/L (ref 0.5–1.9)

## 2018-02-25 LAB — CG4 I-STAT (LACTIC ACID)
Lactic Acid, Venous: 1.47 mmol/L (ref 0.5–1.9)
Lactic Acid, Venous: 1.15 mmol/L (ref 0.5–1.9)

## 2018-02-25 LAB — PROTIME-INR
INR: 1.1
Prothrombin Time: 14.1 seconds (ref 11.4–15.2)

## 2018-02-25 MED ORDER — IOPAMIDOL (ISOVUE-370) INJECTION 76%
100.0000 mL | Freq: Once | INTRAVENOUS | Status: AC | PRN
  Administered 2018-02-25: 100 mL via INTRAVENOUS

## 2018-02-25 MED ORDER — HYDROMORPHONE HCL 1 MG/ML IJ SOLN
1.0000 mg | Freq: Once | INTRAMUSCULAR | Status: AC
  Administered 2018-02-25: 1 mg via INTRAVENOUS
  Filled 2018-02-25: qty 1

## 2018-02-25 MED ORDER — SODIUM CHLORIDE 0.9 % IV BOLUS
1000.0000 mL | Freq: Once | INTRAVENOUS | Status: AC
  Administered 2018-02-25: 1000 mL via INTRAVENOUS

## 2018-02-25 MED ORDER — IOPAMIDOL (ISOVUE-370) INJECTION 76%
INTRAVENOUS | Status: AC
  Filled 2018-02-25: qty 100

## 2018-02-25 NOTE — ED Notes (Signed)
Hospitalist at bedside 

## 2018-02-25 NOTE — ED Notes (Addendum)
Patient family to nurses station reports patient has blood tinged sputum. MD made aware.

## 2018-02-25 NOTE — ED Provider Notes (Signed)
[name] DEPT Provider Note   CSN: [id number] Arrival date & time: 02/25/18  1305     History   Chief Complaint Chief Complaint  Patient presents with  . Fever  . Cough  . Leg Pain  . chemo card    HPI Tony [name] is a 52 y.o. male with history of metastatic lung cancer with neck mass, anxiety, chronic low back pain, GERD, hypertension, migraines presents for evaluation of gradual onset, progressively worsening fatigue, productive cough, right leg pain, and fevers for the past 4 days.  He was seen and evaluated in the ED on 02/16/2018 for similar symptoms and was found to be stable for discharge home at the time.  He followed up with his oncologist and was scheduled for chemotherapy on 02/20/2018 but was found to be too ill to undergo chemotherapy.  He was started on doxycycline and has been compliant with this for the past 5 days secondary to infection of the right foot.  He has not had a treatment since 02/13/2018.  Wife states that the infection to the toes of the right foot looks worse despite doxycycline.  No drainage however states that swelling and discoloration of the right second digit has worsened.  Patient notes pain to the incision site from prior bypass surgery to the right medial thigh.  Also notes right lateral thigh pain.  Describes the pain as a throbbing sensation, worsens with attempts to ambulate.  Patient traveled to [facility] 3 hours away this past weekend and wife notes that he did not leave the hotel the entire time.  He is wheelchair-bound at baseline at this point.  She does note that he will get lightheaded with position changes but denies syncope.  Developed a fever maximum temperature 102 F 2 days ago, well-controlled with Tylenol.  He does note shortness of breath which he states is not far from his baseline but has a productive cough with increased secretions.  Cough is productive of yellow-white sputum which is different from  his baseline.  Denies chest pain.  He denies abdominal pain but notes that he is constipated.  Last bowel movement was this morning.  He also notes difficulty urinating noting that he will experience urgency but will not be able to urinate for over 20 minutes standing over the toilet.  Denies dysuria, hematuria, melena, hematochezia.  He does note nausea but no vomiting.  Wife also notes that the skin to the upper chest has been peeling and oozing.  He has radiation changes to this area at baseline.  Has been applying cream without improvement.   The history is provided by the patient.    Past Medical History:  Diagnosis Date  . Anxiety   . Arterial occlusive disease    multilevel arterial occlusive disease/notes 11/13/2017  . Arthritis    "left knee" (11/13/2017)  . Cancer ([place])    stage 4 lung cancer  . Chronic back pain   . Chronic lower back pain   . Depression   . Gastric ulcer due to Helicobacter pylori 6579  . GERD (gastroesophageal reflux disease)   . High cholesterol   . History of blood transfusion   . Hypertension   . IBS (irritable bowel syndrome)   . Mesenteric adenitis 2011   presumed/H&P  . Migraines    "use to suffer migraines years ago" (11/13/2017)  . Peripheral vascular disease ([place])   . Pneumonia 2011   "wife states he didn't have pneumonia"  .  Ulcer     Patient Active Problem List   Diagnosis Date Noted  . Dehydration 02/25/2018  . Sepsis ([place]) 02/25/2018  . Thrush of mouth and esophagus ([place]) 01/16/2018  . Counseling regarding advance care planning and goals of care 01/01/2018  . Adenocarcinoma ([place])   . SVC syndrome 12/31/2017  . Adenocarcinoma of left lung, stage 4 ([place]) 12/31/2017  . Mass in neck   . Neck pain due to malignant neoplastic disease ([place])   . Palliative care by specialist   . Uncontrolled pain   . Palliative care encounter   . Malignant neoplasm of upper lobe of right lung ([place]) 12/25/2017  . Gangrene of right foot ([place]) 12/20/2017  .  AKI (acute kidney injury) ([place]) 12/20/2017  . PAD (peripheral artery disease) ([place]) 12/19/2017  . S/P aortobifemoral bypass surgery 12/17/2017  . Lung mass 12/17/2017  . Postoperative wound infection 12/17/2017  . Weight loss 12/17/2017  . Cervical lymphadenopathy 12/17/2017  . DVT (deep venous thrombosis) ([place]) 12/17/2017  . Aortoiliac occlusive disease ([place]) 11/15/2017  . Pain of right lower extremity due to ischemia 11/13/2017  . Hyperlipidemia LDL goal <70 11/13/2017  . PVD (peripheral vascular disease) ([place]) 11/11/2017  . H. pylori infection 10/11/2011  . Duodenal ulcer 08/23/2011  . IBS (irritable bowel syndrome) 08/18/2011  . Depression with anxiety 05/11/2009  . EPIGASTRIC PAIN, CHRONIC 05/11/2009  . SMOKER 09/01/2008  . INSOMNIA, CHRONIC 09/01/2008  . Essential hypertension 09/01/2008  . PALPITATIONS, CHRONIC 09/01/2008  . GASTRITIS 10/24/2007    Past Surgical History:  Procedure Laterality Date  . AORTA - BILATERAL FEMORAL ARTERY BYPASS GRAFT Bilateral 11/15/2017   Procedure: AORTA BIFEMORAL BYPASS GRAFT;  Surgeon: Angelia Mould, MD;  Location: [place];  Service: Vascular;  Laterality: Bilateral;  . BACK SURGERY  X 4   "procedure where they burn the nerves every 6 months"  . EMBOLECTOMY Left 12/01/2017   Procedure: THROMBECTOMY OF LEFT AORTAFEMORAL BYPASS, THROMBECTOMY OF TIBIAL ARTERY;  Surgeon: Rosetta Posner, MD;  Location: [facility] OR;  Service: Vascular;  Laterality: Left;  . ESOPHAGOGASTRODUODENOSCOPY  08/22/2011   Procedure: ESOPHAGOGASTRODUODENOSCOPY (EGD);  Surgeon: [name], MD;  Location: [place];  Service: Gastroenterology;  Laterality: N/A;  . FEMORAL-POPLITEAL BYPASS GRAFT Right 11/15/2017   Procedure: Right FEMORAL-Above knee POPLITEAL ARTERY Bypass Graft using nonreversed saphenous vein ;  Surgeon: Angelia Mould, MD;  Location: [place];  Service: Vascular;  Laterality: Right;  . FEMORAL-POPLITEAL BYPASS GRAFT Left 12/01/2017   Procedure: BYPASS  GRAFT FEMORAL- ABOVE KNEE POPLITEAL ARTERY WITH VEIN;  Surgeon: Rosetta Posner, MD;  Location: [place];  Service: Vascular;  Laterality: Left;  . INTRAOPERATIVE ARTERIOGRAM Right 11/15/2017   Procedure: INTRA OPERATIVE ARTERIOGRAM;  Surgeon: Angelia Mould, MD;  Location: [place];  Service: Vascular;  Laterality: Right;  . INTRAOPERATIVE ARTERIOGRAM Left 12/01/2017   Procedure: INTRA OPERATIVE ARTERIOGRAM OF LEFT LEG;  Surgeon: Rosetta Posner, MD;  Location: [place];  Service: Vascular;  Laterality: Left;  . IR US GUIDE BX ASP/DRAIN  12/18/2017  . KNEE ARTHROSCOPY Left X 5        Home Medications    Prior to Admission medications   Medication Sig Start Date End Date Taking? Authorizing Provider  acetaminophen (TYLENOL) 500 MG tablet Take 2 tablets (1,000 mg total) by mouth 3 (three) times daily. 01/23/18  Yes Acquanetta Chain, DO  belladonna-PHENObarbital (DONNATAL) 16.2 MG/5ML ELIX Take 10 mLs (32.4 mg total) by mouth 3 (three) times daily as needed  for cramping or pain. 01/31/18 03/02/18 Yes [name] L, DO  dexamethasone (DECADRON) 4 MG tablet Take 1 tablet (4 mg total) by mouth daily. 02/22/18 03/24/18 Yes Acquanetta Chain, DO  doxycycline (VIBRA-TABS) 100 MG tablet Take 1 tablet (100 mg total) by mouth 2 (two) times daily. 02/20/18  Yes Brunetta Genera, MD  dronabinol (MARINOL) 2.5 MG capsule Take 1 capsule (2.5 mg total) by mouth 2 (two) times daily before lunch and supper. 02/14/18 03/16/18 Yes Acquanetta Chain, DO  DULoxetine (CYMBALTA) 60 MG capsule Take 1 capsule (60 mg total) by mouth daily. 02/14/18 03/16/18 Yes Acquanetta Chain, DO  fluconazole (DIFLUCAN) 100 MG tablet Take 1 tablet (100 mg total) by mouth daily. 02/14/18 03/16/18 Yes Acquanetta Chain, DO  fondaparinux (ARIXTRA) 7.5 MG/0.6ML SOLN injection Inject 0.6 mLs (7.5 mg total) into the skin daily at 6 PM for 26 days. 02/14/18 03/12/18 Yes Acquanetta Chain, DO  gabapentin (NEURONTIN) 400 MG capsule Take 1  capsule (400 mg total) by mouth 2 (two) times daily. 02/22/18  Yes [name] L, DO  HYDROmorphone (DILAUDID) 8 MG tablet Take 16 mg by mouth every 2 (two) hours as needed for severe pain.   Yes [provider]  LORazepam (ATIVAN) 1 MG tablet Take 0.5 tablets (0.5 mg total) by mouth 2 (two) times daily AND 1 tablet (1 mg total) at bedtime. Patient taking differently: 1 mg at bedtime only 02/22/18 03/24/18 Yes Acquanetta Chain, DO  magic mouthwash SOLN Take 5-10 mLs by mouth 2 (two) times daily. 02/18/18  Yes [provider]  methadone (DOLOPHINE) 10 MG tablet Take 2 tablets (20 mg total) by mouth every 8 (eight) hours. 02/14/18 03/16/18 Yes Acquanetta Chain, DO  methocarbamol (ROBAXIN) 750 MG tablet Take 1 tablet (750 mg total) by mouth 3 (three) times daily. Patient taking differently: Take 750 mg by mouth 2 (two) times daily.  02/14/18 03/16/18 Yes [name] L, DO  OLANZapine (ZYPREXA) 5 MG tablet Take 1 tablet (5 mg total) by mouth at bedtime. 02/14/18 03/16/18 Yes Acquanetta Chain, DO  omeprazole (PRILOSEC) 40 MG capsule Take 1 capsule (40 mg total) by mouth daily. 02/14/18 03/16/18 Yes [name] L, DO  ondansetron (ZOFRAN) 8 MG tablet Take 1 tablet (8 mg total) by mouth 2 (two) times daily as needed for refractory nausea / vomiting. Start on day 3 after chemo. 02/14/18  Yes [name] L, DO  polyethylene glycol (MIRALAX / GLYCOLAX) packet Take 17 g by mouth daily. 01/24/18  Yes Patrecia Pour, Christean Grief, MD  povidone-iodine (BETADINE) 10 % external solution Apply topically as needed for wound care. Patient taking differently: Apply 1 application topically 3 (three) times daily as needed for wound care.  01/23/18  Yes [name] L, DO  senna-docusate (SENOKOT-S) 8.6-50 MG tablet Take 2 tablets by mouth 2 (two) times daily. 01/23/18  Yes Patrecia Pour, Christean Grief, MD  atorvastatin (LIPITOR) 40 MG tablet Take 1 tablet (40 mg total) by mouth at bedtime. Patient  not taking: Reported on 02/25/2018 01/23/18 02/22/18  Acquanetta Chain, DO  NARCAN 4 MG/0.1ML LIQD nasal spray kit Place 1 spray into the nose daily as needed. Overdose of medication 01/23/18   [provider]  ondansetron (ZOFRAN) 4 MG tablet Take 1 tablet (4 mg total) by mouth every 6 (six) hours as needed for nausea or vomiting. Patient not taking: Reported on 02/25/2018 02/14/18   [name] L, DO  pantoprazole (PROTONIX) 40 MG tablet Take 1  tablet (40 mg total) by mouth daily. 02/22/18 03/24/18  Acquanetta Chain, DO  prochlorperazine (COMPAZINE) 10 MG tablet Take 1 tablet (10 mg total) by mouth every 6 (six) hours as needed (Nausea or vomiting). Patient not taking: Reported on 02/25/2018 02/05/18   Brunetta Genera, MD  sulfamethoxazole-trimethoprim (BACTRIM DS,SEPTRA DS) 800-160 MG tablet Take 1 tablet by mouth daily. Patient not taking: Reported on 02/25/2018 02/14/18 03/16/18  Acquanetta Chain, DO    Family History Family History  Problem Relation Age of Onset  . Hypertension Mother   . Heart disease Mother   . Hypertension Father   . Coronary artery disease Father 34  . Heart attack Brother 54  . Coronary artery disease Brother   . Heart disease Brother   . Diabetes Neg Hx   . Stroke Neg Hx     Social History Social History   Tobacco Use  . Smoking status: Former Smoker    Packs/day: 1.00    Years: 24.00    Pack years: 24.00    Types: Cigarettes    Start date: 11/28/2017    Last attempt to quit: 12/04/2017    Years since quitting: 0.2  . Smokeless tobacco: Never Used  Substance Use Topics  . Alcohol use: Not Currently  . Drug use: Not Currently    Types: Marijuana    Comment: 11/13/2017  "last drug use was in ~ 1987 "     Allergies   Pork-derived products; Shellfish-derived products; and Morphine and related   Review of Systems Review of Systems  Constitutional: Negative for chills and fever.  Respiratory: Positive for cough and shortness  of breath.   Cardiovascular: Negative for chest pain and leg swelling.  Gastrointestinal: Positive for constipation and nausea. Negative for abdominal pain, blood in stool, diarrhea and vomiting.  Genitourinary: Positive for decreased urine volume and difficulty urinating. Negative for dysuria and frequency.  Musculoskeletal: Positive for myalgias.  Skin: Positive for color change.  All other systems reviewed and are negative.    Physical Exam Updated Vital Signs BP (!) 204/123   Pulse (!) 119   Temp 98.9 F (37.2 C) (Rectal)   Resp (!) 24   Ht 6' (1.829 m)   Wt 69.4 kg   SpO2 97%   BMI 20.75 kg/m    Physical Exam  Constitutional: He appears well-developed and well-nourished. No distress.  Thin, chronically ill-appearing  HENT:  Head: Normocephalic and atraumatic.  Speaks with a soft hoarse voice  Eyes: Conjunctivae are normal. Right eye exhibits no discharge. Left eye exhibits no discharge.  Neck: Normal range of motion. Neck supple. No JVD present. No tracheal deviation present.  Large right-sided anterior neck mass. Skin to this area with mild purulent drainage.   Cardiovascular: Regular rhythm, normal heart sounds and intact distal pulses.  Tachycardic, faint distal pulses though they are dopplerable bilaterally.  No lower extremity edema.  Homans sign absent bilaterally.  Components are soft.  Pulmonary/Chest: Effort normal. No stridor. No respiratory distress. He has no wheezes.  Radiation skin changes noted to the anterior chest.   Abdominal: Soft. Bowel sounds are normal. He exhibits no distension. There is no tenderness. There is no guarding.  Large midline surgical incision, well-healed  Musculoskeletal: He exhibits no edema.  4+/5 strength of bilateral lower extremity major muscle groups.  Well-healed incision to the medial aspect of the right thigh, some tenderness to palpation overlying this area and the right IT band with no swelling, deformity, crepitus,  erythema,  or warmth peer  Neurological: He is alert.  Skin: Skin is warm and dry. No erythema.  See below images of the right foot.  Gangrenous changes noted to multiple toes with dusky coloration and swelling of some of the toes.  No drainage.   Psychiatric: He has a normal mood and affect. His behavior is normal.  Nursing note and vitals reviewed.        ED Treatments / Results  Labs (all labs ordered are listed, but only abnormal results are displayed) Labs Reviewed  COMPREHENSIVE METABOLIC PANEL - Abnormal; Notable for the following components:      Result Value   BUN 28 (*)    Albumin 2.7 (*)    Alkaline Phosphatase 132 (*)    All other components within normal limits  CBC WITH DIFFERENTIAL/PLATELET - Abnormal; Notable for the following components:   RBC 3.42 (*)    Hemoglobin 9.6 (*)    HCT 31.1 (*)    RDW 17.2 (*)    Lymphs Abs 0.2 (*)    All other components within normal limits  URINALYSIS, ROUTINE W REFLEX MICROSCOPIC - Abnormal; Notable for the following components:   Specific Gravity, Urine >1.046 (*)    Ketones, ur 20 (*)    Protein, ur 30 (*)    Bacteria, UA RARE (*)    All other components within normal limits  BLOOD GAS, VENOUS - Abnormal; Notable for the following components:   pCO2, Ven 33.0 (*)    pO2, Ven 54.1 (*)    Acid-base deficit 2.9 (*)    All other components within normal limits  CULTURE, BLOOD (ROUTINE X 2)  CULTURE, BLOOD (ROUTINE X 2)  PROTIME-INR  LACTIC ACID, PLASMA  I-STAT CG4 LACTIC ACID, ED  CG4 I-STAT (LACTIC ACID)  I-STAT CG4 LACTIC ACID, ED  CG4 I-STAT (LACTIC ACID)    EKG ED ECG REPORT   Date: 02/26/2018  Rate: 110  Rhythm: sinus tachycardia  QRS Axis: normal  Intervals: normal  ST/T Wave abnormalities: normal  Conduction Disutrbances:none  Narrative Interpretation:   Old EKG Reviewed: unchanged  I have personally reviewed the EKG tracing and agree with the computerized printout as noted.   Radiology Dg Chest  2 View  Result Date: 02/25/2018 CLINICAL DATA:  Cough with fever and chills EXAM: CHEST - 2 VIEW COMPARISON:  Chest CT January 07, 2018; chest radiograph December 16, 2017 FINDINGS: There remains adenopathy throughout the right azygos and paratracheal regions, similar to most recent study. There is no edema or consolidation. Heart size and pulmonary vascularity are normal. No evident bone lesions. IMPRESSION: Right-sided adenopathy, unchanged from recent study. Other areas of adenopathy seen on prior CT are not appreciable by radiography. No edema or consolidation. Previously noted mass in the posterior segment right upper lobe on chest CT is not appreciable by radiography. Heart size normal. Electronically Signed   By: Lowella Grip III M.D.   On: 02/25/2018 14:12   Ct Angio Chest Pe W And/or Wo Contrast  Result Date: 02/25/2018 CLINICAL DATA:  Productive cough. Metastatic cancer with involvement of the right upper lobe EXAM: CT ANGIOGRAPHY CHEST WITH CONTRAST TECHNIQUE: Multidetector CT imaging of the chest was performed using the standard protocol during bolus administration of intravenous contrast. Multiplanar CT image reconstructions and MIPs were obtained to evaluate the vascular anatomy. CONTRAST:  158m ISOVUE-370 IOPAMIDOL (ISOVUE-370) INJECTION 76% COMPARISON:  Multiple exams, including 01/07/2018 FINDINGS: Despite efforts by the technologist and patient, motion artifact is present on today's exam and  could not be eliminated. This reduces exam sensitivity and specificity. Cardiovascular: No filling defect is identified in the pulmonary arterial tree to suggest pulmonary embolus. There is narrowing of the right upper lobe pulmonary artery due to extrinsic mass effect from tumor. Difficult to be certain that the tumor is not invading the right upper lobe pulmonary artery given the complete 360 degree engulfing of the vessel by the tumor, but I do not see obvious filling defect within the narrowed segment  of pulmonary artery traversing the tumor. The contour is also fairly similar to 01/07/18. Mediastinum/Nodes: Bulky right paratracheal tumor as before, measuring up to 4.6 cm in short axis, formerly 4.8 cm. A prevascular node measures 1.6 cm in short axis on image 44/6, previously 2.1 cm. Right hilar node 1.9 cm in short axis on image 50/6, formerly 2.0 cm. Conglomerate right supraclavicular adenopathy difficult to separate from the sternocleidomastoid muscle, less sharply defined but mildly reduced in overall size compared to prior exam. A left supraclavicular node measures 2.2 cm in short axis on image 9/6, formerly the same. Bilateral pathologic axillary adenopathy. An index left axillary node measures 2.5 cm in short axis on image 31/6, significantly worsened, previously 0.9 cm. Right axillary and subpectoral adenopathy to prior. Lungs/Pleura: Paraseptal emphysema. The right upper lobe lung nodule measures up to 0.8 cm in short axis on image 43/12, previously 1.3 cm by my measurements. New ground-glass density band in the right upper lobe for example on image 55/12, likely from alveolitis, less likely a new focus of low-grade adenocarcinoma. Small amount of frothy fluid in the trachea and right mainstem bronchus. Upper Abdomen: Unremarkable Musculoskeletal: Unremarkable Review of the MIP images confirms the above findings. IMPRESSION: 1. No filling defect is identified in the pulmonary arterial tree to suggest pulmonary embolus. Stable degree of mass effect on the right upper lobe pulmonary artery by the right mediastinal tumor. 2. Mixed appearance of the tumor burden, on balance the tumor burden is mildly reduced. Specifically, the bulky mediastinal tumor is mildly reduced in size; the right upper lobe bandlike pulmonary nodule is reduced in size; and mediastinal and right supraclavicular adenopathy is mildly reduced. However, left axillary adenopathy is significantly increased. 3. Other imaging findings of  potential clinical significance: Emphysema (ICD10-J43.9). Mild focus of alveolitis in the right upper lobe. Electronically Signed   By: Van Clines M.D.   On: 02/25/2018 18:21   Dg Foot Complete Right  Result Date: 02/25/2018 CLINICAL DATA:  Necrosis of the great toe, second toe and third toe. EXAM: RIGHT FOOT COMPLETE - 3+ VIEW COMPARISON:  None. FINDINGS: Pronounced deficiency of the soft tissue of the distal toes, most notable at the second and third toes. No plain radiographic evidence of osteomyelitis. No recent fracture. Old healed fracture of the proximal phalanx of the fifth toe. IMPRESSION: Pronounced soft tissue deficiency of the distal toes. No plain radiographic evidence of osteomyelitis. Electronically Signed   By: Nelson Chimes M.D.   On: 02/25/2018 16:56    Procedures Procedures (including critical care time)  Medications Ordered in ED Medications  iopamidol (ISOVUE-370) 76 % injection (has no administration in time range)  sodium chloride 0.9 % bolus 1,000 mL (0 mLs Intravenous Stopped 02/25/18 1820)  iopamidol (ISOVUE-370) 76 % injection 100 mL (100 mLs Intravenous Contrast Given 02/25/18 1744)  HYDROmorphone (DILAUDID) injection 1 mg (1 mg Intravenous Given 02/25/18 1854)  sodium chloride 0.9 % bolus 1,000 mL (0 mLs Intravenous Stopped 02/25/18 2221)  HYDROmorphone (DILAUDID) injection 1 mg (1  mg Intravenous Given 02/25/18 2000)     Initial Impression / Assessment and Plan / ED Course  I have reviewed the triage vital signs and the nursing notes.  Pertinent labs & imaging results that were available during my care of the patient were reviewed by me and considered in my medical decision making (see chart for details).     Patient with metastatic lung cancer presents for evaluation of fevers, productive cough, right leg pain.  He is afebrile but persistently tachycardic in the ED. appears ill.  Chest x-ray shows no acute abnormalities but does show known masses.  EKG  shows sinus tachycardia but no ischemic changes.  DVT study negative for DVT and CTA negative for PE.  No evidence of pneumonia.  No leukocytosis.  His BUN is acutely elevated and his creatinine is up above his baseline of around 0.6.  UA shows ketonuria, proteinuria, and increased specific gravity.  Findings suggestive of dehydration.  He was given 2 L of IV fluids in the ED with persistent tachycardia.  Would benefit from admission for further evaluation and management.Spoke with Dr. Roel Cluck with [facility] hospitalist service who agrees to assume care of patient and bring him into the hospital for further evaluation and management.  Patient seen and evaluated by Dr. Ronnald Nian who agrees with assessment and plan at this time.  Final Clinical Impressions(s) / ED Diagnoses   Final diagnoses:  Dehydration    ED Discharge Orders    None      Renita Papa, PA-C 02/26/18 0050  Lennice Sites, DO 02/26/18 0222

## 2018-02-25 NOTE — ED Notes (Signed)
Family to nurses station requesting pain medication for patient. PA made aware.

## 2018-02-25 NOTE — ED Notes (Signed)
Unsuccessful IV attempt x2. PA made aware.

## 2018-02-25 NOTE — ED Notes (Signed)
Respiratory made aware of VBG.

## 2018-02-25 NOTE — H&P (Signed)
Tony [name] WJX:[id number] DOB: Oct 23, 1965 DOA: 02/25/2018     PCP: [name], MD   Outpatient Specialists:      Oncology   Dr. Irene Limbo   [facility] [place] Patient arrived to ER on 02/25/18 at 1305  Patient coming from: home Lives With family   Chief Complaint:  Chief Complaint  Patient presents with  . Fever  . Cough  . Leg Pain  . chemo card    HPI: Tony [name] is a 52 y.o. male with medical history significant of metastatic adenocarcinoma of the left lung currently sp  Chemotherapy finished radiation therapy, PVD, right foot gangrene, chronic back pain depression GERD, HLD, HTN, SVC syndrome and neck    Presented with multiple complaints including fever chills cough productive yellow sputum urinary retention significant constipation right leg pain Tony [name] endorses fever at home up to 102. Patient was supposed to get chemotherapy on 11 September but felt to be too ill to have chemo.  Instead he was started doxycycline for gangrenous right foot he is been taking his medications but the food seems to getting worse he has been having worsening thigh pain as well is worse with trying to ambulate. Chronic shortness of breath but his cough has been getting worse with increased secretions.  No associated chest pain he has been having difficulty relating not making much urine for the past 24 hours  Regarding pertinent Chronic problems: History of peripheral vascular disease status post aortobifemoral bypass surgery in 2019   While in ER: Dopplers showed no evidence of DVT  CXR/ CTA no PNA, NO PE The following Work up has been ordered so far:  Orders Placed This Encounter  Procedures  . Culture, blood (Routine x 2)  . DG Chest 2 View  . DG Foot Complete Right  . CT Angio Chest PE W and/or Wo Contrast  . Comprehensive metabolic panel  . CBC with Differential  . Protime-INR  . Urinalysis, Routine w reflex microscopic  . Notify Physician if pt is possible Sepsis  patient  . Document Actual / Estimated Weight  . Insert / maintain saline lock  . Bladder scan  . Orthostatic vital signs  . Check Rectal Temperature  . Vital signs  . Consult to hospitalist  . I-Stat CG4 Lactic Acid, ED  . CG4 I-STAT (Lactic acid)  . CG4 I-STAT (Lactic acid)  . ED EKG     Following Medications were ordered in ER: Medications  iopamidol (ISOVUE-370) 76 % injection (has no administration in time range)  sodium chloride 0.9 % bolus 1,000 mL (0 mLs Intravenous Stopped 02/25/18 1820)  iopamidol (ISOVUE-370) 76 % injection 100 mL (100 mLs Intravenous Contrast Given 02/25/18 1744)  HYDROmorphone (DILAUDID) injection 1 mg (1 mg Intravenous Given 02/25/18 1854)  sodium chloride 0.9 % bolus 1,000 mL (1,000 mLs Intravenous New Bag/Given 02/25/18 1919)  HYDROmorphone (DILAUDID) injection 1 mg (1 mg Intravenous Given 02/25/18 2000)    Significant initial  Findings: Abnormal Labs Reviewed  COMPREHENSIVE METABOLIC PANEL - Abnormal; Notable for the following components:      Result Value   BUN 28 (*)    Albumin 2.7 (*)    Alkaline Phosphatase 132 (*)    All other components within normal limits  CBC WITH DIFFERENTIAL/PLATELET - Abnormal; Notable for the following components:   RBC 3.42 (*)    Hemoglobin 9.6 (*)    HCT 31.1 (*)    RDW 17.2 (*)    Lymphs Abs 0.2 (*)  All other components within normal limits  URINALYSIS, ROUTINE W REFLEX MICROSCOPIC - Abnormal; Notable for the following components:   Specific Gravity, Urine >1.046 (*)    Ketones, ur 20 (*)    Protein, ur 30 (*)    Bacteria, UA RARE (*)    All other components within normal limits     Na 140 K 4.1  Cr    Up from baseline see below Lab Results  Component Value Date   CREATININE 1.00 02/25/2018   CREATININE 0.67 02/20/2018   CREATININE 0.66 02/16/2018      WBC  7.5  HG/HCT  Up from baseline see below    Component Value Date/Time   HGB 9.6 (L) 02/25/2018 1344   HGB 8.6 (L) 02/13/2018 1239    HCT 31.1 (L) 02/25/2018 1344    ProBNP (last 3 results) No results for input(s): PROBNP in the last 8760 hours.  Lactic Acid, Venous    Component Value Date/Time   LATICACIDVEN 1.15 02/25/2018 1721      UA   no evidence of UTI       CXR -  NON acute CTA non acute   ECG:  Personally reviewed by me showing: HR : 110 Rhythm: Sinus tachycardia   no evidence of ischemic changes QTC 436        ED Triage Vitals  Enc Vitals Group     BP 02/25/18 1316 111/76     Pulse Rate 02/25/18 1316 (!) 113     Resp 02/25/18 1316 16     Temp 02/25/18 1316 98.1 F (36.7 C)     Temp Source 02/25/18 1316 Oral     SpO2 02/25/18 1316 98 %     Weight 02/25/18 1320 153 lb (69.4 kg)     Height 02/25/18 1320 6' (1.829 m)     Head Circumference --      Peak Flow --      Pain Score 02/25/18 1320 6     Pain Loc --      Pain Edu? --      Excl. in [place]? --   TMAX(24)@       Latest  Blood pressure (!) 166/86, pulse (!) 114, temperature 98.9 F (37.2 C), temperature source Rectal, resp. rate 14, height 6' (1.829 m), weight 69.4 kg, SpO2 98 %.     Hospitalist was called for admission for dehydration Sepsis   Review of Systems:    Pertinent positives include: Fevers, chills, fatigue,  Constitutional:  No weight loss, night sweats,  weight loss  HEENT:  No headaches, Difficulty swallowing,Tooth/dental problems,Sore throat,  No sneezing, itching, ear ache, nasal congestion, post nasal drip,  Cardio-vascular:  No chest pain, Orthopnea, PND, anasarca, dizziness, palpitations.no Bilateral lower extremity swelling  GI:  No heartburn, indigestion, abdominal pain, nausea, vomiting, diarrhea, change in bowel habits, loss of appetite, melena, blood in stool, hematemesis Resp:  no shortness of breath at rest. No dyspnea on exertion, No excess mucus, no productive cough, No non-productive cough, No coughing up of blood.No change in color of mucus.No wheezing. Skin:  no rash or lesions. No  jaundice GU:  no dysuria, change in color of urine, no urgency or frequency. No straining to urinate.  No flank pain.  Musculoskeletal:  No joint pain or no joint swelling. No decreased range of motion. No back pain.  Psych:  No change in mood or affect. No depression or anxiety. No memory loss.  Neuro: no localizing neurological complaints, no tingling, no  weakness, no double vision, no gait abnormality, no slurred speech, no confusion  All systems reviewed and apart from [place] all are negative  Past Medical History:   Past Medical History:  Diagnosis Date  . Anxiety   . Arterial occlusive disease    multilevel arterial occlusive disease/notes 11/13/2017  . Arthritis    "left knee" (11/13/2017)  . Cancer ([place])    stage 4 lung cancer  . Chronic back pain   . Chronic lower back pain   . Depression   . Gastric ulcer due to Helicobacter pylori 7829  . GERD (gastroesophageal reflux disease)   . High cholesterol   . History of blood transfusion   . Hypertension   . IBS (irritable bowel syndrome)   . Mesenteric adenitis 2011   presumed/H&P  . Migraines    "use to suffer migraines years ago" (11/13/2017)  . Peripheral vascular disease ([place])   . Pneumonia 2011   "wife states he didn't have pneumonia"  . Ulcer       Past Surgical History:  Procedure Laterality Date  . AORTA - BILATERAL FEMORAL ARTERY BYPASS GRAFT Bilateral 11/15/2017   Procedure: AORTA BIFEMORAL BYPASS GRAFT;  Surgeon: Angelia Mould, MD;  Location: [facility];  Service: Vascular;  Laterality: Bilateral;  . BACK SURGERY  X 4   "procedure where they burn the nerves every 6 months"  . EMBOLECTOMY Left 12/01/2017   Procedure: THROMBECTOMY OF LEFT AORTAFEMORAL BYPASS, THROMBECTOMY OF TIBIAL ARTERY;  Surgeon: Rosetta Posner, MD;  Location: [facility] OR;  Service: Vascular;  Laterality: Left;  . ESOPHAGOGASTRODUODENOSCOPY  08/22/2011   Procedure: ESOPHAGOGASTRODUODENOSCOPY (EGD);  Surgeon: [name], MD;  Location: [place];  Service: Gastroenterology;  Laterality: N/A;  . FEMORAL-POPLITEAL BYPASS GRAFT Right 11/15/2017   Procedure: Right FEMORAL-Above knee POPLITEAL ARTERY Bypass Graft using nonreversed saphenous vein ;  Surgeon: Angelia Mould, MD;  Location: [place];  Service: Vascular;  Laterality: Right;  . FEMORAL-POPLITEAL BYPASS GRAFT Left 12/01/2017   Procedure: BYPASS GRAFT FEMORAL- ABOVE KNEE POPLITEAL ARTERY WITH VEIN;  Surgeon: Rosetta Posner, MD;  Location: [place];  Service: Vascular;  Laterality: Left;  . INTRAOPERATIVE ARTERIOGRAM Right 11/15/2017   Procedure: INTRA OPERATIVE ARTERIOGRAM;  Surgeon: Angelia Mould, MD;  Location: [place];  Service: Vascular;  Laterality: Right;  . INTRAOPERATIVE ARTERIOGRAM Left 12/01/2017   Procedure: INTRA OPERATIVE ARTERIOGRAM OF LEFT LEG;  Surgeon: Rosetta Posner, MD;  Location: [place];  Service: Vascular;  Laterality: Left;  . IR US GUIDE BX ASP/DRAIN  12/18/2017  . KNEE ARTHROSCOPY Left X 5    Social History:  Ambulatory   wheelchair bound,       reports that he quit smoking about 2 months ago. His smoking use included cigarettes. He started smoking about 2 months ago. He has a 24.00 pack-year smoking history. He has never used smokeless tobacco. He reports that he drank alcohol. He reports that he has current or past drug history. Drug: Marijuana.     Family History:   Family History  Problem Relation Age of Onset  . Hypertension Mother   . Heart disease Mother   . Hypertension Father   . Coronary artery disease Father 4  . Heart attack Brother 70  . Coronary artery disease Brother   . Heart disease Brother   . Diabetes Neg Hx   . Stroke Neg Hx     Allergies: Allergies  Allergen Reactions  . Pork-Derived Products Other (See Comments)  UNSPECIFIED REASON - Pt doesn't eat pork  . Shellfish-Derived Products Other (See Comments)    Doesn't want to eat  . Morphine And Related Other (See Comments)    Hallucinations     Prior  to Admission medications   Medication Sig Start Date End Date Taking? Authorizing Provider  acetaminophen (TYLENOL) 500 MG tablet Take 2 tablets (1,000 mg total) by mouth 3 (three) times daily. 01/23/18  Yes Acquanetta Chain, DO  belladonna-PHENObarbital (DONNATAL) 16.2 MG/5ML ELIX Take 10 mLs (32.4 mg total) by mouth 3 (three) times daily as needed for cramping or pain. 01/31/18 03/02/18 Yes [name] L, DO  dexamethasone (DECADRON) 4 MG tablet Take 1 tablet (4 mg total) by mouth daily. 02/22/18 03/24/18 Yes Acquanetta Chain, DO  doxycycline (VIBRA-TABS) 100 MG tablet Take 1 tablet (100 mg total) by mouth 2 (two) times daily. 02/20/18  Yes Brunetta Genera, MD  dronabinol (MARINOL) 2.5 MG capsule Take 1 capsule (2.5 mg total) by mouth 2 (two) times daily before lunch and supper. 02/14/18 03/16/18 Yes Acquanetta Chain, DO  DULoxetine (CYMBALTA) 60 MG capsule Take 1 capsule (60 mg total) by mouth daily. 02/14/18 03/16/18 Yes Acquanetta Chain, DO  fluconazole (DIFLUCAN) 100 MG tablet Take 1 tablet (100 mg total) by mouth daily. 02/14/18 03/16/18 Yes Acquanetta Chain, DO  fondaparinux (ARIXTRA) 7.5 MG/0.6ML SOLN injection Inject 0.6 mLs (7.5 mg total) into the skin daily at 6 PM for 26 days. 02/14/18 03/12/18 Yes Acquanetta Chain, DO  gabapentin (NEURONTIN) 400 MG capsule Take 1 capsule (400 mg total) by mouth 2 (two) times daily. 02/22/18  Yes [name] L, DO  HYDROmorphone (DILAUDID) 8 MG tablet Take 16 mg by mouth every 2 (two) hours as needed for severe pain.   Yes [provider]  LORazepam (ATIVAN) 1 MG tablet Take 0.5 tablets (0.5 mg total) by mouth 2 (two) times daily AND 1 tablet (1 mg total) at bedtime. Patient taking differently: 1 mg at bedtime only 02/22/18 03/24/18 Yes Acquanetta Chain, DO  magic mouthwash SOLN Take 5-10 mLs by mouth 2 (two) times daily. 02/18/18  Yes [provider]  methadone (DOLOPHINE) 10 MG tablet Take 2 tablets (20 mg  total) by mouth every 8 (eight) hours. 02/14/18 03/16/18 Yes Acquanetta Chain, DO  methocarbamol (ROBAXIN) 750 MG tablet Take 1 tablet (750 mg total) by mouth 3 (three) times daily. Patient taking differently: Take 750 mg by mouth 2 (two) times daily.  02/14/18 03/16/18 Yes [name] L, DO  OLANZapine (ZYPREXA) 5 MG tablet Take 1 tablet (5 mg total) by mouth at bedtime. 02/14/18 03/16/18 Yes Acquanetta Chain, DO  omeprazole (PRILOSEC) 40 MG capsule Take 1 capsule (40 mg total) by mouth daily. 02/14/18 03/16/18 Yes [name] L, DO  ondansetron (ZOFRAN) 8 MG tablet Take 1 tablet (8 mg total) by mouth 2 (two) times daily as needed for refractory nausea / vomiting. Start on day 3 after chemo. 02/14/18  Yes [name] L, DO  polyethylene glycol (MIRALAX / GLYCOLAX) packet Take 17 g by mouth daily. 01/24/18  Yes Patrecia Pour, Christean Grief, MD  povidone-iodine (BETADINE) 10 % external solution Apply topically as needed for wound care. Patient taking differently: Apply 1 application topically 3 (three) times daily as needed for wound care.  01/23/18  Yes [name] L, DO  senna-docusate (SENOKOT-S) 8.6-50 MG tablet Take 2 tablets by mouth 2 (two) times daily. 01/23/18  Yes Doreatha Lew, MD  atorvastatin (LIPITOR)  40 MG tablet Take 1 tablet (40 mg total) by mouth at bedtime. Patient not taking: Reported on 02/25/2018 01/23/18 02/22/18  Acquanetta Chain, DO  NARCAN 4 MG/0.1ML LIQD nasal spray kit Place 1 spray into the nose daily as needed. Overdose of medication 01/23/18   [provider]  ondansetron (ZOFRAN) 4 MG tablet Take 1 tablet (4 mg total) by mouth every 6 (six) hours as needed for nausea or vomiting. Patient not taking: Reported on 02/25/2018 02/14/18   Acquanetta Chain, DO  pantoprazole (PROTONIX) 40 MG tablet Take 1 tablet (40 mg total) by mouth daily. 02/22/18 03/24/18  Acquanetta Chain, DO  prochlorperazine (COMPAZINE) 10 MG tablet Take 1 tablet (10 mg  total) by mouth every 6 (six) hours as needed (Nausea or vomiting). Patient not taking: Reported on 02/25/2018 02/05/18   Brunetta Genera, MD  sulfamethoxazole-trimethoprim (BACTRIM DS,SEPTRA DS) 800-160 MG tablet Take 1 tablet by mouth daily. Patient not taking: Reported on 02/25/2018 02/14/18 03/16/18  Acquanetta Chain, DO   Physical Exam: Blood pressure (!) 166/86, pulse (!) 114, temperature 98.9 F (37.2 C), temperature source Rectal, resp. rate 14, height 6' (1.829 m), weight 69.4 kg, SpO2 98 %. 1. General:  in No Acute distress  Chronically ill -appearing 2. Psychological: Alert and Oriented 3. Head/ENT:   Dry Mucous Membranes                          Head Non traumatic, neck supple                            Poor Dentition 4. SKIN:  decreased Skin turgor,  Skin clean Dry severe redness on the right of the neck with purulent discharge, redness of the right lower extremity, dry gangrene of her right toes noted some redness of the foot                5. Heart: Regular rate and rhythm no Murmur, no Rub or gallop 6. Lungs:   no wheezes or crackles   7. Abdomen: Soft,  non-tender, Non distended bowel sounds present 8. Lower extremities: no clubbing, cyanosis, or  Edema, dry gangrene of right toes 9. Neurologically Grossly intact, moving all 4 extremities equally   10. MSK: Normal range of motion   LABS:     Recent Labs  Lab 02/20/18 0912 02/25/18 1344  WBC 5.5 7.5  NEUTROABS 5.2 7.0  HGB 8.2* 9.6*  HCT 26.9* 31.1*  MCV 91.2 90.9  PLT 219 960   Basic Metabolic Panel: Recent Labs  Lab 02/20/18 0912 02/25/18 1344  NA 137 140  K 4.5 4.1  CL 100 100  CO2 26 25  GLUCOSE 84 79  BUN 17 28*  CREATININE 0.67 1.00  CALCIUM 9.6 9.6      Recent Labs  Lab 02/20/18 0912 02/25/18 1344  AST 17 22  ALT 15 18  ALKPHOS 122 132*  BILITOT <0.2* 0.9  PROT 6.8 7.3  ALBUMIN 2.3* 2.7*   No results for input(s): LIPASE, AMYLASE in the last 168 hours. No  results for input(s): AMMONIA in the last 168 hours.    HbA1C: No results for input(s): HGBA1C in the last 72 hours. CBG: No results for input(s): GLUCAP in the last 168 hours.    Urine analysis:    Component Value Date/Time   COLORURINE YELLOW 02/25/2018 1928   APPEARANCEUR CLEAR 02/25/2018 1928  LABSPEC >1.046 (H) 02/25/2018 1928   PHURINE 5.0 02/25/2018 1928   GLUCOSEU NEGATIVE 02/25/2018 1928   HGBUR NEGATIVE 02/25/2018 1928   HGBUR negative 07/26/2009 1050   BILIRUBINUR NEGATIVE 02/25/2018 1928   KETONESUR 20 (A) 02/25/2018 1928   PROTEINUR 30 (A) 02/25/2018 1928   UROBILINOGEN 0.2 08/21/2011 1911   NITRITE NEGATIVE 02/25/2018 1928   LEUKOCYTESUR NEGATIVE 02/25/2018 1928       Cultures:    Component Value Date/Time   SDES WOUND LEFT LEG 01/20/2018 2258   SPECREQUEST WOUND LEFT LEG 01/20/2018 2258   CULT FEW STAPHYLOCOCCUS AUREUS 01/20/2018 2258   REPTSTATUS 01/23/2018 FINAL 01/20/2018 2258     Radiological Exams on Admission: Dg Chest 2 View  Result Date: 02/25/2018 CLINICAL DATA:  Cough with fever and chills EXAM: CHEST - 2 VIEW COMPARISON:  Chest CT January 07, 2018; chest radiograph December 16, 2017 FINDINGS: There remains adenopathy throughout the right azygos and paratracheal regions, similar to most recent study. There is no edema or consolidation. Heart size and pulmonary vascularity are normal. No evident bone lesions. IMPRESSION: Right-sided adenopathy, unchanged from recent study. Other areas of adenopathy seen on prior CT are not appreciable by radiography. No edema or consolidation. Previously noted mass in the posterior segment right upper lobe on chest CT is not appreciable by radiography. Heart size normal. Electronically Signed   By: Lowella Grip III M.D.   On: 02/25/2018 14:12   Ct Angio Chest Pe W And/or Wo Contrast  Result Date: 02/25/2018 CLINICAL DATA:  Productive cough. Metastatic cancer with involvement of the right upper lobe EXAM: CT  ANGIOGRAPHY CHEST WITH CONTRAST TECHNIQUE: Multidetector CT imaging of the chest was performed using the standard protocol during bolus administration of intravenous contrast. Multiplanar CT image reconstructions and MIPs were obtained to evaluate the vascular anatomy. CONTRAST:  184m ISOVUE-370 IOPAMIDOL (ISOVUE-370) INJECTION 76% COMPARISON:  Multiple exams, including 01/07/2018 FINDINGS: Despite efforts by the technologist and patient, motion artifact is present on today's exam and could not be eliminated. This reduces exam sensitivity and specificity. Cardiovascular: No filling defect is identified in the pulmonary arterial tree to suggest pulmonary embolus. There is narrowing of the right upper lobe pulmonary artery due to extrinsic mass effect from tumor. Difficult to be certain that the tumor is not invading the right upper lobe pulmonary artery given the complete 360 degree engulfing of the vessel by the tumor, but I do not see obvious filling defect within the narrowed segment of pulmonary artery traversing the tumor. The contour is also fairly similar to 01/07/18. Mediastinum/Nodes: Bulky right paratracheal tumor as before, measuring up to 4.6 cm in short axis, formerly 4.8 cm. A prevascular node measures 1.6 cm in short axis on image 44/6, previously 2.1 cm. Right hilar node 1.9 cm in short axis on image 50/6, formerly 2.0 cm. Conglomerate right supraclavicular adenopathy difficult to separate from the sternocleidomastoid muscle, less sharply defined but mildly reduced in overall size compared to prior exam. A left supraclavicular node measures 2.2 cm in short axis on image 9/6, formerly the same. Bilateral pathologic axillary adenopathy. An index left axillary node measures 2.5 cm in short axis on image 31/6, significantly worsened, previously 0.9 cm. Right axillary and subpectoral adenopathy to prior. Lungs/Pleura: Paraseptal emphysema. The right upper lobe lung nodule measures up to 0.8 cm in short  axis on image 43/12, previously 1.3 cm by my measurements. New ground-glass density band in the right upper lobe for example on image 55/12, likely from alveolitis, less  likely a new focus of low-grade adenocarcinoma. Small amount of frothy fluid in the trachea and right mainstem bronchus. Upper Abdomen: Unremarkable Musculoskeletal: Unremarkable Review of the MIP images confirms the above findings. IMPRESSION: 1. No filling defect is identified in the pulmonary arterial tree to suggest pulmonary embolus. Stable degree of mass effect on the right upper lobe pulmonary artery by the right mediastinal tumor. 2. Mixed appearance of the tumor burden, on balance the tumor burden is mildly reduced. Specifically, the bulky mediastinal tumor is mildly reduced in size; the right upper lobe bandlike pulmonary nodule is reduced in size; and mediastinal and right supraclavicular adenopathy is mildly reduced. However, left axillary adenopathy is significantly increased. 3. Other imaging findings of potential clinical significance: Emphysema (ICD10-J43.9). Mild focus of alveolitis in the right upper lobe. Electronically Signed   By: Van Clines M.D.   On: 02/25/2018 18:21   Dg Foot Complete Right  Result Date: 02/25/2018 CLINICAL DATA:  Necrosis of the great toe, second toe and third toe. EXAM: RIGHT FOOT COMPLETE - 3+ VIEW COMPARISON:  None. FINDINGS: Pronounced deficiency of the soft tissue of the distal toes, most notable at the second and third toes. No plain radiographic evidence of osteomyelitis. No recent fracture. Old healed fracture of the proximal phalanx of the fifth toe. IMPRESSION: Pronounced soft tissue deficiency of the distal toes. No plain radiographic evidence of osteomyelitis. Electronically Signed   By: Nelson Chimes M.D.   On: 02/25/2018 16:56    Chart has been reviewed    Assessment/Plan  52 y.o. male with medical history significant of metastatic adenocarcinoma of the left lung currently  sp  Chemotherapy finished radiation therapy, PVD, right foot gangrene, chronic back pain depression GERD, HLD, HTN, SVC syndrome and neck  Admitted for dehydration and possible sepsis  Present on Admission: . Dehydration -rehydrate with IV fluids and monitor fluid status . Aortoiliac occlusive disease ([place]) -currently appears to have good blood supply to the foot no evidence of cold foot will need to follow-up with vascular surgery as an outpatient . Essential hypertension - allow permissive hypertension for now given sepsis . Gangrene of right foot ([place]) -could be potentially source of infection although only mild redness noted around the site . Hyperlipidemia LDL goal <70 stable resume home medications when able to tolerate  . Malignant neoplasm of upper lobe of right lung [facility]) -currently undergoing radiation therapy will notify oncology the patient has been admitted . Mass in neck status post radiation therapy with evidence of radiation changes to the skin with possible secondary infection   . AKI (acute kidney injury) ([place]) rehydrate likely secondary to dehydration . Sepsis ([place]) secondary to cellulitis secondary to radiation changes administer broad-spectrum antibiotics until for now rehydrate obtain blood and urine cultures . Cellulitis covered with IV antibiotics most likely source being infected neck wound History of thrush continue Magic mouthwash and fluconazole   pain secondary to cancer continue home medications will need IV pain management secondary to decreased p.o. intake and ability to tolerate p.o. medications Other plan as per orders. History of DVT status post IVC filter continue fondaparinux   DVT prophylaxis: Chronic anticoagulation with fondaparinux  Code Status:  FULL CODE as per patient   I had personally discussed CODE STATUS with patient and family  Family Communication:   Family  at  Bedside  plan of care was discussed with   , Daughter, Wife,    Disposition  Plan:    likely will need placement for rehabilitation  Would benefit from PT/OT eval prior to DC  Ordered                                    Nutrition    consulted                  Wound care  consulted                                     Consults called: notify Oncology    Admission status:    inpatient     Expect 2 midnight stay secondary to severity of patient's current illness including hemodynamic instability despite optimal treatment (tachycardia  )   and extensive comorbidities including:      malignancy,   That are currently affecting medical management.  I expect  patient to be hospitalized for 2 midnights requiring inpatient medical care.  Patient is at high risk for adverse outcome (such as loss of life or disability) if not treated.  Indication for inpatient stay as follows:  Hemodynamic instability despite maximal medical therapy,    severe pain requiring acute inpatient management,  inability to maintain oral hydration    Need for IV antibiotics, IV fluids       Level of care        SDU tele indefinitely please discontinue once patient no longer qualifies      Tony [name] 02/26/2018, 1:18 AM    [facility]  Pager [phone]   after 2 AM please page floor coverage PA If 7AM-7PM, please contact the day team taking care of the patient  [email or web address]  Password [email or web address]

## 2018-02-25 NOTE — Progress Notes (Signed)
Preliminary notes--Bilateral lower extremities venous duplex exam completed. Negative for DVT. Limited study due to patient condition. Hongying Landry Mellow (RDMS RVT) 02/25/18 4:29 PM

## 2018-02-25 NOTE — ED Notes (Signed)
Per family request, wound to right neck covered with non adherent dressing. Purulent drainage noted.

## 2018-02-25 NOTE — ED Triage Notes (Addendum)
Patient c/o fever, chills, a productive cough with thick yellow sputum, urinary retention, constipation and right leg pain. Patient states that he had a BM this AM but still feels constipation. Patient is also having redness, and peeling of skin from radiation treatments.  Patient's family reports temp at home 102.0 orally and was given Tylenol 500 mg x 2 at 1200 today.

## 2018-02-25 NOTE — ED Notes (Signed)
Per MD request, patient provided with mouth swabs.

## 2018-02-25 NOTE — ED Notes (Signed)
Charge RN asked to attempt [place] IV.

## 2018-02-25 NOTE — ED Notes (Signed)
Patient transported to CT 

## 2018-02-25 NOTE — ED Notes (Signed)
US at bedside

## 2018-02-26 ENCOUNTER — Ambulatory Visit (HOSPITAL_COMMUNITY): Payer: BLUE CROSS/BLUE SHIELD

## 2018-02-26 ENCOUNTER — Other Ambulatory Visit (HOSPITAL_COMMUNITY): Payer: Self-pay

## 2018-02-26 ENCOUNTER — Other Ambulatory Visit: Payer: Self-pay

## 2018-02-26 ENCOUNTER — Encounter (HOSPITAL_COMMUNITY): Payer: Self-pay

## 2018-02-26 DIAGNOSIS — N179 Acute kidney failure, unspecified: Secondary | ICD-10-CM

## 2018-02-26 DIAGNOSIS — R52 Pain, unspecified: Secondary | ICD-10-CM

## 2018-02-26 DIAGNOSIS — E860 Dehydration: Secondary | ICD-10-CM

## 2018-02-26 DIAGNOSIS — L0390 Cellulitis, unspecified: Secondary | ICD-10-CM | POA: Diagnosis present

## 2018-02-26 DIAGNOSIS — I7409 Other arterial embolism and thrombosis of abdominal aorta: Secondary | ICD-10-CM

## 2018-02-26 DIAGNOSIS — Z515 Encounter for palliative care: Secondary | ICD-10-CM

## 2018-02-26 DIAGNOSIS — C7800 Secondary malignant neoplasm of unspecified lung: Secondary | ICD-10-CM

## 2018-02-26 LAB — COMPREHENSIVE METABOLIC PANEL
ALK PHOS: 106 U/L (ref 38–126)
ALT: 15 U/L (ref 0–44)
AST: 21 U/L (ref 15–41)
Albumin: 2.1 g/dL — ABNORMAL LOW (ref 3.5–5.0)
Anion gap: 14 (ref 5–15)
BUN: 17 mg/dL (ref 6–20)
CALCIUM: 8.4 mg/dL — AB (ref 8.9–10.3)
CHLORIDE: 103 mmol/L (ref 98–111)
CO2: 19 mmol/L — AB (ref 22–32)
CREATININE: 0.78 mg/dL (ref 0.61–1.24)
GFR calc Af Amer: >60 mL/min (ref >60)
Glucose, Bld: 77 mg/dL (ref 70–99)
Potassium: 3.9 mmol/L (ref 3.5–5.1)
SODIUM: 136 mmol/L (ref 135–145)
Total Bilirubin: 1 mg/dL (ref 0.3–1.2)
Total Protein: 6.1 g/dL — ABNORMAL LOW (ref 6.5–8.1)

## 2018-02-26 LAB — MAGNESIUM: Magnesium: 1.6 mg/dL — ABNORMAL LOW (ref 1.7–2.4)

## 2018-02-26 LAB — CBC
HCT: 24.7 % — ABNORMAL LOW (ref 39.0–52.0)
Hemoglobin: 7.8 g/dL — ABNORMAL LOW (ref 13.0–17.0)
MCH: 28.6 pg (ref 26.0–34.0)
MCHC: 31.6 g/dL (ref 30.0–36.0)
MCV: 90.5 fL (ref 78.0–100.0)
Platelets: 171 10*3/uL (ref 150–400)
RBC: 2.73 MIL/uL — ABNORMAL LOW (ref 4.22–5.81)
RDW: 17.2 % — AB (ref 11.5–15.5)
WBC: 6.9 10*3/uL (ref 4.0–10.5)

## 2018-02-26 LAB — MRSA PCR SCREENING: MRSA BY PCR: POSITIVE — AB

## 2018-02-26 LAB — TROPONIN I
TROPONIN I: 0.03 ng/mL — AB (ref <0.03)
TROPONIN I: 0.03 ng/mL — AB (ref <0.03)
Troponin I: <0.03 ng/mL (ref <0.03)

## 2018-02-26 LAB — PHOSPHORUS: Phosphorus: 3 mg/dL (ref 2.5–4.6)

## 2018-02-26 LAB — PREALBUMIN: PREALBUMIN: 14 mg/dL — AB (ref 18–38)

## 2018-02-26 LAB — TSH: TSH: 1.412 u[IU]/mL (ref 0.350–4.500)

## 2018-02-26 LAB — PROCALCITONIN: PROCALCITONIN: 0.57 ng/mL

## 2018-02-26 MED ORDER — METRONIDAZOLE IN NACL 5-0.79 MG/ML-% IV SOLN
500.0000 mg | Freq: Three times a day (TID) | INTRAVENOUS | Status: DC
  Administered 2018-02-26 – 2018-02-28 (×8): 500 mg via INTRAVENOUS
  Filled 2018-02-26 (×8): qty 100

## 2018-02-26 MED ORDER — METHOCARBAMOL 500 MG PO TABS
750.0000 mg | ORAL_TABLET | Freq: Two times a day (BID) | ORAL | Status: DC
  Administered 2018-02-26: 750 mg via ORAL
  Filled 2018-02-26 (×2): qty 2

## 2018-02-26 MED ORDER — MUPIROCIN 2 % EX OINT
1.0000 "application " | TOPICAL_OINTMENT | Freq: Two times a day (BID) | CUTANEOUS | Status: DC
  Administered 2018-02-26 – 2018-03-01 (×6): 1 via NASAL
  Filled 2018-02-26 (×3): qty 22

## 2018-02-26 MED ORDER — SODIUM CHLORIDE 0.9 % IV SOLN
1.0000 g | Freq: Three times a day (TID) | INTRAVENOUS | Status: DC
  Administered 2018-02-26 – 2018-03-01 (×9): 1 g via INTRAVENOUS
  Filled 2018-02-26 (×12): qty 1

## 2018-02-26 MED ORDER — LORAZEPAM 2 MG/ML IJ SOLN
0.5000 mg | Freq: Four times a day (QID) | INTRAMUSCULAR | Status: DC | PRN
  Administered 2018-02-28: 0.5 mg via INTRAVENOUS
  Filled 2018-02-26: qty 1

## 2018-02-26 MED ORDER — BISACODYL 10 MG RE SUPP: 10.0000 mg | Freq: Every day | RECTAL | Status: DC | PRN

## 2018-02-26 MED ORDER — HYDROMORPHONE HCL 1 MG/ML IJ SOLN
1.0000 mg | INTRAMUSCULAR | Status: DC
  Administered 2018-02-26 – 2018-02-27 (×4): 1 mg via INTRAVENOUS
  Filled 2018-02-26 (×3): qty 1

## 2018-02-26 MED ORDER — SODIUM CHLORIDE 0.9 % IV SOLN
2.0000 g | Freq: Once | INTRAVENOUS | Status: AC
  Administered 2018-02-26: 2 g via INTRAVENOUS
  Filled 2018-02-26: qty 2

## 2018-02-26 MED ORDER — DEXAMETHASONE SODIUM PHOSPHATE 10 MG/ML IJ SOLN
4.0000 mg | Freq: Every day | INTRAMUSCULAR | Status: DC
  Administered 2018-02-26 – 2018-02-28 (×3): 4 mg via INTRAVENOUS
  Filled 2018-02-26 (×3): qty 1

## 2018-02-26 MED ORDER — MAGNESIUM SULFATE 4 GM/100ML IV SOLN
4.0000 g | Freq: Once | INTRAVENOUS | Status: AC
  Administered 2018-02-26: 4 g via INTRAVENOUS
  Filled 2018-02-26: qty 100

## 2018-02-26 MED ORDER — GABAPENTIN 400 MG PO CAPS
400.0000 mg | ORAL_CAPSULE | Freq: Two times a day (BID) | ORAL | Status: DC
  Administered 2018-02-26 – 2018-03-01 (×5): 400 mg via ORAL
  Filled 2018-02-26 (×6): qty 1

## 2018-02-26 MED ORDER — LABETALOL HCL 5 MG/ML IV SOLN: 5.0000 mg | Freq: Three times a day (TID) | INTRAVENOUS | Status: DC

## 2018-02-26 MED ORDER — ONDANSETRON HCL 4 MG/2ML IJ SOLN
4.0000 mg | Freq: Four times a day (QID) | INTRAMUSCULAR | Status: DC | PRN
  Administered 2018-02-27 (×2): 4 mg via INTRAVENOUS
  Filled 2018-02-26 (×3): qty 2

## 2018-02-26 MED ORDER — LORAZEPAM 0.5 MG PO TABS: 0.5000 mg | ORAL_TABLET | ORAL | Status: DC | PRN

## 2018-02-26 MED ORDER — ONDANSETRON HCL 4 MG PO TABS: 4.0000 mg | ORAL_TABLET | Freq: Four times a day (QID) | ORAL | Status: DC | PRN

## 2018-02-26 MED ORDER — LORAZEPAM 1 MG PO TABS
1.0000 mg | ORAL_TABLET | ORAL | Status: DC | PRN
  Administered 2018-02-26: 1 mg via ORAL
  Filled 2018-02-26: qty 1

## 2018-02-26 MED ORDER — METHOCARBAMOL 1000 MG/10ML IJ SOLN
500.0000 mg | Freq: Two times a day (BID) | INTRAVENOUS | Status: DC
  Administered 2018-02-26 – 2018-02-27 (×3): 500 mg via INTRAVENOUS
  Filled 2018-02-26: qty 500
  Filled 2018-02-26: qty 5
  Filled 2018-02-26 (×2): qty 500

## 2018-02-26 MED ORDER — TRIAMCINOLONE ACETONIDE 0.1 % EX CREA
TOPICAL_CREAM | Freq: Two times a day (BID) | CUTANEOUS | Status: DC
  Administered 2018-02-26 – 2018-02-28 (×5): via TOPICAL
  Filled 2018-02-26 (×2): qty 15

## 2018-02-26 MED ORDER — METOPROLOL TARTRATE 5 MG/5ML IV SOLN
2.5000 mg | Freq: Three times a day (TID) | INTRAVENOUS | Status: DC
  Administered 2018-02-26 – 2018-03-01 (×9): 2.5 mg via INTRAVENOUS
  Filled 2018-02-26 (×9): qty 5

## 2018-02-26 MED ORDER — DEXTROSE-NACL 5-0.45 % IV SOLN
INTRAVENOUS | Status: DC
  Administered 2018-02-26 – 2018-02-28 (×3): via INTRAVENOUS

## 2018-02-26 MED ORDER — VANCOMYCIN HCL IN DEXTROSE 1-5 GM/200ML-% IV SOLN
1000.0000 mg | Freq: Once | INTRAVENOUS | Status: AC
  Administered 2018-02-26: 1000 mg via INTRAVENOUS
  Filled 2018-02-26: qty 200

## 2018-02-26 MED ORDER — SODIUM CHLORIDE 0.9 % IV SOLN
INTRAVENOUS | Status: DC | PRN
  Administered 2018-02-26 – 2018-02-28 (×3): 250 mL via INTRAVENOUS

## 2018-02-26 MED ORDER — IPRATROPIUM-ALBUTEROL 0.5-2.5 (3) MG/3ML IN SOLN
3.0000 mL | Freq: Three times a day (TID) | RESPIRATORY_TRACT | Status: DC
  Administered 2018-02-26 – 2018-03-01 (×8): 3 mL via RESPIRATORY_TRACT
  Filled 2018-02-26 (×8): qty 3

## 2018-02-26 MED ORDER — SENNOSIDES-DOCUSATE SODIUM 8.6-50 MG PO TABS
2.0000 | ORAL_TABLET | Freq: Two times a day (BID) | ORAL | Status: DC
  Administered 2018-02-27 – 2018-02-28 (×2): 2 via ORAL
  Filled 2018-02-26 (×5): qty 2

## 2018-02-26 MED ORDER — ACETAMINOPHEN 325 MG PO TABS: 650.0000 mg | ORAL_TABLET | Freq: Four times a day (QID) | ORAL | Status: DC | PRN

## 2018-02-26 MED ORDER — HYDROMORPHONE HCL 1 MG/ML IJ SOLN
1.0000 mg | INTRAMUSCULAR | Status: DC | PRN
  Administered 2018-02-26 (×2): 1 mg via INTRAVENOUS
  Filled 2018-02-26 (×2): qty 1

## 2018-02-26 MED ORDER — FLUCONAZOLE 100MG IVPB
100.0000 mg | INTRAVENOUS | Status: DC
  Administered 2018-02-27: 100 mg via INTRAVENOUS
  Filled 2018-02-26: qty 50

## 2018-02-26 MED ORDER — SODIUM CHLORIDE 0.9 % IV BOLUS
500.0000 mL | Freq: Once | INTRAVENOUS | Status: AC
  Administered 2018-02-26: 500 mL via INTRAVENOUS

## 2018-02-26 MED ORDER — MAGIC MOUTHWASH
5.0000 mL | Freq: Two times a day (BID) | ORAL | Status: DC
  Administered 2018-02-27 (×2): 5 mL via ORAL
  Administered 2018-02-28: 10 mL via ORAL
  Administered 2018-02-28: 5 mL via ORAL
  Filled 2018-02-26: qty 5
  Filled 2018-02-26 (×2): qty 10
  Filled 2018-02-26: qty 5
  Filled 2018-02-26: qty 10
  Filled 2018-02-26 (×2): qty 5
  Filled 2018-02-26: qty 10

## 2018-02-26 MED ORDER — PANTOPRAZOLE SODIUM 40 MG PO TBEC
40.0000 mg | DELAYED_RELEASE_TABLET | Freq: Every day | ORAL | Status: DC
  Filled 2018-02-26: qty 1

## 2018-02-26 MED ORDER — ACETAMINOPHEN 650 MG RE SUPP
650.0000 mg | Freq: Four times a day (QID) | RECTAL | Status: DC | PRN
  Administered 2018-02-26: 650 mg via RECTAL
  Filled 2018-02-26: qty 1

## 2018-02-26 MED ORDER — SUCRALFATE 1 GM/10ML PO SUSP
1.0000 g | Freq: Three times a day (TID) | ORAL | Status: DC
  Administered 2018-02-27 (×4): 1 g via ORAL
  Filled 2018-02-26 (×5): qty 10

## 2018-02-26 MED ORDER — CHLORHEXIDINE GLUCONATE CLOTH 2 % EX PADS
6.0000 | MEDICATED_PAD | Freq: Every day | CUTANEOUS | Status: DC
  Administered 2018-02-27: 6 via TOPICAL

## 2018-02-26 MED ORDER — FAMOTIDINE IN NACL 20-0.9 MG/50ML-% IV SOLN
20.0000 mg | Freq: Two times a day (BID) | INTRAVENOUS | Status: DC
  Administered 2018-02-26 – 2018-02-27 (×3): 20 mg via INTRAVENOUS
  Filled 2018-02-26 (×4): qty 50

## 2018-02-26 MED ORDER — SODIUM CHLORIDE 0.9 % IV SOLN
INTRAVENOUS | Status: AC
  Administered 2018-02-26: 02:00:00 via INTRAVENOUS

## 2018-02-26 MED ORDER — IPRATROPIUM BROMIDE 0.02 % IN SOLN: 0.5000 mg | Freq: Four times a day (QID) | RESPIRATORY_TRACT | Status: DC

## 2018-02-26 MED ORDER — VANCOMYCIN HCL 10 G IV SOLR
1750.0000 mg | INTRAVENOUS | Status: DC
  Administered 2018-02-26 – 2018-02-27 (×2): 1750 mg via INTRAVENOUS
  Filled 2018-02-26 (×3): qty 1750

## 2018-02-26 MED ORDER — POLYETHYLENE GLYCOL 3350 17 G PO PACK
17.0000 g | PACK | Freq: Every day | ORAL | Status: DC
  Administered 2018-02-26 – 2018-02-28 (×2): 17 g via ORAL
  Filled 2018-02-26 (×3): qty 1

## 2018-02-26 MED ORDER — THIAMINE HCL 100 MG/ML IJ SOLN
100.0000 mg | Freq: Every day | INTRAMUSCULAR | Status: DC
  Administered 2018-02-26 – 2018-03-01 (×4): 100 mg via INTRAVENOUS
  Filled 2018-02-26 (×4): qty 2

## 2018-02-26 MED ORDER — FLUCONAZOLE 100 MG PO TABS
100.0000 mg | ORAL_TABLET | Freq: Every day | ORAL | Status: DC
  Administered 2018-02-26: 100 mg via ORAL
  Filled 2018-02-26 (×2): qty 1

## 2018-02-26 MED ORDER — ALBUTEROL SULFATE (2.5 MG/3ML) 0.083% IN NEBU
2.5000 mg | INHALATION_SOLUTION | RESPIRATORY_TRACT | Status: DC | PRN
  Administered 2018-02-26: 2.5 mg via RESPIRATORY_TRACT
  Filled 2018-02-26: qty 3

## 2018-02-26 MED ORDER — HYDROMORPHONE HCL 1 MG/ML IJ SOLN
1.0000 mg | INTRAMUSCULAR | Status: DC | PRN
  Administered 2018-02-27 (×4): 1 mg via INTRAVENOUS
  Filled 2018-02-26 (×6): qty 1

## 2018-02-26 MED ORDER — DULOXETINE HCL 60 MG PO CPEP
60.0000 mg | ORAL_CAPSULE | Freq: Every day | ORAL | Status: DC
  Administered 2018-02-28 – 2018-03-01 (×2): 60 mg via ORAL
  Filled 2018-02-26: qty 2
  Filled 2018-02-26: qty 1
  Filled 2018-02-26: qty 2

## 2018-02-26 MED ORDER — METHADONE HCL 10 MG PO TABS
20.0000 mg | ORAL_TABLET | Freq: Three times a day (TID) | ORAL | Status: DC
  Administered 2018-02-26: 20 mg via ORAL
  Filled 2018-02-26: qty 4

## 2018-02-26 MED ORDER — PANTOPRAZOLE SODIUM 40 MG PO TBEC: 40.0000 mg | DELAYED_RELEASE_TABLET | Freq: Every day | ORAL | Status: DC

## 2018-02-26 MED ORDER — FONDAPARINUX SODIUM 7.5 MG/0.6ML ~~LOC~~ SOLN
7.5000 mg | Freq: Every day | SUBCUTANEOUS | Status: DC
  Administered 2018-02-26 – 2018-02-28 (×3): 7.5 mg via SUBCUTANEOUS
  Filled 2018-02-26 (×4): qty 0.6

## 2018-02-26 MED ORDER — OLANZAPINE 5 MG PO TBDP
5.0000 mg | ORAL_TABLET | Freq: Every day | ORAL | Status: DC
  Administered 2018-02-26 – 2018-02-28 (×3): 5 mg via ORAL
  Filled 2018-02-26 (×3): qty 1

## 2018-02-26 MED ORDER — OLANZAPINE 5 MG PO TABS
5.0000 mg | ORAL_TABLET | Freq: Every day | ORAL | Status: DC
  Administered 2018-02-26: 5 mg via ORAL
  Filled 2018-02-26 (×2): qty 1

## 2018-02-26 NOTE — ED Notes (Signed)
ED TO INPATIENT HANDOFF REPORT  Name/Age/Gender Tony [name] 52 y.o. male  Code Status    Code Status Orders  (From admission, onward)         Start     Ordered   02/26/18 0144  Full code  Continuous     02/26/18 0143        Code Status History    Date Active Date Inactive Code Status Order ID Comments User Context   12/17/2017 1334 01/23/2018 1904 Full Code [id number]  Gabriel Earing, PA-C ED   12/01/2017 1504 12/05/2017 2116 Full Code [id number]  Rosetta Posner, MD Inpatient   11/13/2017 0925 11/26/2017 1601 Full Code [id number]  Karmen Bongo, MD ED   11/11/2017 0208 11/11/2017 1345 Full Code [id number]  Angelia Mould, MD Inpatient   11/11/2017 0154 11/11/2017 0208 Full Code [id number]  Angelia Mould, MD ED   08/22/2011 0013 08/23/2011 1813 Full Code [id number]  Sterrett, Idelle Jo, RN Inpatient      Home/SNF/Other Home  Chief Complaint Fever; Chills; Chemo Card  Level of Care/Admitting Diagnosis ED Disposition    ED Disposition Condition Comment   Admit  Hospital Area: [facility] [[id number]]  Level of Care: Stepdown [14]  Admit to SDU based on following criteria: Other see comments  Admit to SDU based on following criteria: Hemodynamic compromise or significant risk of instability:  Patient requiring short term acute titration and management of vasoactive drips, and invasive monitoring (i.e., CVP and Arterial line).  Comments: sepsis  Diagnosis: Sepsis [facility]) [[id number]]  Admitting Physician: Toy Baker [[id number]]  Attending Physician: Toy Baker [[id number]]  Estimated length of stay: 3 - 4 days  Certification:: I certify this patient will need inpatient services for at least 2 midnights  PT Class (Do Not Modify): Inpatient [101]  PT Acc Code (Do Not Modify): Private [1]       Medical History Past Medical History:  Diagnosis Date  . Anxiety   . Arterial occlusive disease    multilevel arterial occlusive disease/notes  11/13/2017  . Arthritis    "left knee" (11/13/2017)  . Cancer ([place])    stage 4 lung cancer  . Chronic back pain   . Chronic lower back pain   . Depression   . Gastric ulcer due to Helicobacter pylori 7893  . GERD (gastroesophageal reflux disease)   . High cholesterol   . History of blood transfusion   . Hypertension   . IBS (irritable bowel syndrome)   . Mesenteric adenitis 2011   presumed/H&P  . Migraines    "use to suffer migraines years ago" (11/13/2017)  . Peripheral vascular disease ([place])   . Pneumonia 2011   "wife states he didn't have pneumonia"  . Ulcer     Allergies Allergies  Allergen Reactions  . Pork-Derived Products Other (See Comments)    UNSPECIFIED REASON - Pt doesn't eat pork  . Shellfish-Derived Products Other (See Comments)    Doesn't want to eat  . Morphine And Related Other (See Comments)    Hallucinations    IV Location/Drains/Wounds Patient Lines/Drains/Airways Status   Active Line/Drains/Airways    Name:   Placement date:   Placement time:   Site:   Days:   Peripheral IV 02/25/18 Left Forearm   02/25/18    1718    Forearm   1   Peripheral IV 02/26/18 Left Wrist   02/26/18    0641    Wrist   less than 1   Incision (  Closed) 12/01/17 Leg Left   12/01/17    1054     87   Incision (Closed) 12/18/17 Neck Right   12/18/17    1647     70          Labs/Imaging Results for orders placed or performed during the hospital encounter of 02/25/18 (from the past 48 hour(s))  Comprehensive metabolic panel     Status: Abnormal   Collection Time: 02/25/18  1:44 PM  Result Value Ref Range   Sodium 140 135 - 145 mmol/L   Potassium 4.1 3.5 - 5.1 mmol/L   Chloride 100 98 - 111 mmol/L   CO2 25 22 - 32 mmol/L   Glucose, Bld 79 70 - 99 mg/dL   BUN 28 (H) 6 - 20 mg/dL   Creatinine, Ser 1.00 0.61 - 1.24 mg/dL   Calcium 9.6 8.9 - 10.3 mg/dL   Total Protein 7.3 6.5 - 8.1 g/dL   Albumin 2.7 (L) 3.5 - 5.0 g/dL   AST 22 15 - 41 U/L   ALT 18 0 - 44 U/L   Alkaline  Phosphatase 132 (H) 38 - 126 U/L   Total Bilirubin 0.9 0.3 - 1.2 mg/dL   GFR calc non Af Amer >60 >60 mL/min   GFR calc Af Amer >60 >60 mL/min    Comment: (NOTE) The eGFR has been calculated using the CKD EPI equation. This calculation has not been validated in all clinical situations. eGFR's persistently <60 mL/min signify possible Chronic Kidney Disease.    Anion gap 15 5 - 15    Comment: Performed at [facility], [place] [address], [place], [place] [address]  CBC with Differential     Status: Abnormal   Collection Time: 02/25/18  1:44 PM  Result Value Ref Range   WBC 7.5 4.0 - 10.5 K/uL   RBC 3.42 (L) 4.22 - 5.81 MIL/uL   Hemoglobin 9.6 (L) 13.0 - 17.0 g/dL   HCT 31.1 (L) 39.0 - 52.0 %   MCV 90.9 78.0 - 100.0 fL   MCH 28.1 26.0 - 34.0 pg   MCHC 30.9 30.0 - 36.0 g/dL   RDW 17.2 (H) 11.5 - 15.5 %   Platelets 228 150 - 400 K/uL   Neutrophils Relative % 94 %   Neutro Abs 7.0 1.7 - 7.7 K/uL   Lymphocytes Relative 3 %   Lymphs Abs 0.2 (L) 0.7 - 4.0 K/uL   Monocytes Relative 3 %   Monocytes Absolute 0.2 0.1 - 1.0 K/uL   Eosinophils Relative 0 %   Eosinophils Absolute 0.0 0.0 - 0.7 K/uL   Basophils Relative 0 %   Basophils Absolute 0.0 0.0 - 0.1 K/uL    Comment: Performed at [facility], [place] [address], [place], [place] [address]  Protime-INR     Status: None   Collection Time: 02/25/18  1:44 PM  Result Value Ref Range   Prothrombin Time 14.1 11.4 - 15.2 seconds   INR 1.10     Comment: Performed at [facility], [place] [address], [place], [place] [address]  CG4 I-STAT (Lactic acid)     Status: None   Collection Time: 02/25/18  1:54 PM  Result Value Ref Range   Lactic Acid, Venous 1.47 0.5 - 1.9 mmol/L  CG4 I-STAT (Lactic acid)     Status: None   Collection Time: 02/25/18  5:21 PM  Result Value Ref Range   Lactic Acid, Venous 1.15 0.5 - 1.9 mmol/L  Urinalysis,  Routine w reflex microscopic     Status: Abnormal    Collection Time: 02/25/18  7:28 PM  Result Value Ref Range   Color, Urine YELLOW YELLOW   APPearance CLEAR CLEAR   Specific Gravity, Urine >1.046 (H) 1.005 - 1.030   pH 5.0 5.0 - 8.0   Glucose, UA NEGATIVE NEGATIVE mg/dL   Hgb urine dipstick NEGATIVE NEGATIVE   Bilirubin Urine NEGATIVE NEGATIVE   Ketones, ur 20 (A) NEGATIVE mg/dL   Protein, ur 30 (A) NEGATIVE mg/dL   Nitrite NEGATIVE NEGATIVE   Leukocytes, UA NEGATIVE NEGATIVE   RBC / HPF 0-5 0 - 5 RBC/hpf   WBC, UA 0-5 0 - 5 WBC/hpf   Bacteria, UA RARE (A) NONE SEEN   Squamous Epithelial / LPF 0-5 0 - 5   Mucus PRESENT     Comment: Performed at [facility], [place] [address] [place], [place] [address]  Blood gas, venous     Status: Abnormal   Collection Time: 02/25/18 11:16 PM  Result Value Ref Range   pH, Ven 7.412 7.250 - 7.430   pCO2, Ven 33.0 (L) 44.0 - 60.0 mmHg   pO2, Ven 54.1 (H) 32.0 - 45.0 mmHg   Bicarbonate 20.6 20.0 - 28.0 mmol/L   Acid-base deficit 2.9 (H) 0.0 - 2.0 mmol/L   O2 Saturation 84.6 %   Patient temperature 98.6    Collection site VEIN    Drawn by DRAWN BY RN    Sample type VENOUS     Comment: Performed at [facility] [address], [place], [place] [address]  Lactic acid, plasma     Status: None   Collection Time: 02/25/18 11:16 PM  Result Value Ref Range   Lactic Acid, Venous 0.8 0.5 - 1.9 mmol/L    Comment: Performed at [facility], [place] [address] [place], [place] [address]  Procalcitonin     Status: None   Collection Time: 02/26/18  1:45 AM  Result Value Ref Range   Procalcitonin 0.57 ng/mL    Comment:        Interpretation: PCT > 0.5 ng/mL and <= 2 ng/mL: Systemic infection (sepsis) is possible, but other conditions are known to elevate PCT as well. (NOTE)       Sepsis PCT Algorithm           Lower Respiratory Tract                                      Infection PCT Algorithm    ----------------------------      ----------------------------         PCT < 0.25 ng/mL                PCT < 0.10 ng/mL         Strongly encourage             Strongly discourage   discontinuation of antibiotics    initiation of antibiotics    ----------------------------     -----------------------------       PCT 0.25 - 0.50 ng/mL            PCT 0.10 - 0.25 ng/mL               OR       >80% decrease in PCT            Discourage initiation of  antibiotics      Encourage discontinuation           of antibiotics    ----------------------------     -----------------------------         PCT >= 0.50 ng/mL              PCT 0.26 - 0.50 ng/mL                AND       <80% decrease in PCT             Encourage initiation of                                             antibiotics       Encourage continuation           of antibiotics    ----------------------------     -----------------------------        PCT >= 0.50 ng/mL                  PCT > 0.50 ng/mL               AND         increase in PCT                  Strongly encourage                                      initiation of antibiotics    Strongly encourage escalation           of antibiotics                                     -----------------------------                                           PCT <= 0.25 ng/mL                                                 OR                                        > 80% decrease in PCT                                     Discontinue / Do not initiate                                             antibiotics Performed at [facility] [address] [place], [place] [address]   Troponin I     Status: None   Collection Time: 02/26/18  1:45 AM  Result Value Ref Range   Troponin I <0.03 <0.03 ng/mL    Comment: Performed at [facility], [place] [address], [place], [place] [address]  Prealbumin     Status: Abnormal   Collection Time: 02/26/18  5:00  AM  Result Value Ref Range   Prealbumin 14.0 (L) 18 - 38 mg/dL    Comment: Performed at [facility], [place] [address], [place], [place] [address]  Magnesium     Status: Abnormal   Collection Time: 02/26/18  5:00 AM  Result Value Ref Range   Magnesium 1.6 (L) 1.7 - 2.4 mg/dL    Comment: Performed at [facility], [place] [address], [place], [place] [address]  Phosphorus     Status: None   Collection Time: 02/26/18  5:00 AM  Result Value Ref Range   Phosphorus 3.0 2.5 - 4.6 mg/dL    Comment: Performed at [facility], [place] [address], [place], [place] [address]  TSH     Status: None   Collection Time: 02/26/18  5:00 AM  Result Value Ref Range   TSH 1.412 0.350 - 4.500 uIU/mL    Comment: Performed by a 3rd Generation assay with a functional sensitivity of <=0.01 uIU/mL. Performed at [facility], [place] [address] [place], [place] [address]   Comprehensive metabolic panel     Status: Abnormal   Collection Time: 02/26/18  5:00 AM  Result Value Ref Range   Sodium 136 135 - 145 mmol/L   Potassium 3.9 3.5 - 5.1 mmol/L   Chloride 103 98 - 111 mmol/L   CO2 19 (L) 22 - 32 mmol/L   Glucose, Bld 77 70 - 99 mg/dL   BUN 17 6 - 20 mg/dL   Creatinine, Ser 0.78 0.61 - 1.24 mg/dL   Calcium 8.4 (L) 8.9 - 10.3 mg/dL   Total Protein 6.1 (L) 6.5 - 8.1 g/dL   Albumin 2.1 (L) 3.5 - 5.0 g/dL   AST 21 15 - 41 U/L   ALT 15 0 - 44 U/L   Alkaline Phosphatase 106 38 - 126 U/L   Total Bilirubin 1.0 0.3 - 1.2 mg/dL   GFR calc non Af Amer >60 >60 mL/min   GFR calc Af Amer >60 >60 mL/min    Comment: (NOTE) The eGFR has been calculated using the CKD EPI equation. This calculation has not been validated in all clinical situations. eGFR's persistently <60 mL/min signify possible Chronic Kidney Disease.    Anion gap 14 5 - 15    Comment: Performed at [facility], [place] [address], [place], [place] [address]  CBC      Status: Abnormal   Collection Time: 02/26/18  5:00 AM  Result Value Ref Range   WBC 6.9 4.0 - 10.5 K/uL   RBC 2.73 (L) 4.22 - 5.81 MIL/uL   Hemoglobin 7.8 (L) 13.0 - 17.0 g/dL   HCT 24.7 (L) 39.0 - 52.0 %   MCV 90.5 78.0 - 100.0 fL   MCH 28.6 26.0 - 34.0 pg   MCHC 31.6 30.0 - 36.0 g/dL   RDW 17.2 (H) 11.5 - 15.5 %   Platelets 171 150 - 400 K/uL    Comment: Performed at [facility], [place] [address], [place], [place] [address]   Dg Chest 2 View  Result Date: 02/25/2018 CLINICAL DATA:  Cough with fever and chills EXAM: CHEST - 2 VIEW COMPARISON:  Chest CT January 07, 2018;  chest radiograph December 16, 2017 FINDINGS: There remains adenopathy throughout the right azygos and paratracheal regions, similar to most recent study. There is no edema or consolidation. Heart size and pulmonary vascularity are normal. No evident bone lesions. IMPRESSION: Right-sided adenopathy, unchanged from recent study. Other areas of adenopathy seen on prior CT are not appreciable by radiography. No edema or consolidation. Previously noted mass in the posterior segment right upper lobe on chest CT is not appreciable by radiography. Heart size normal. Electronically Signed   By: Lowella Grip III M.D.   On: 02/25/2018 14:12   Ct Angio Chest Pe W And/or Wo Contrast  Result Date: 02/25/2018 CLINICAL DATA:  Productive cough. Metastatic cancer with involvement of the right upper lobe EXAM: CT ANGIOGRAPHY CHEST WITH CONTRAST TECHNIQUE: Multidetector CT imaging of the chest was performed using the standard protocol during bolus administration of intravenous contrast. Multiplanar CT image reconstructions and MIPs were obtained to evaluate the vascular anatomy. CONTRAST:  134m ISOVUE-370 IOPAMIDOL (ISOVUE-370) INJECTION 76% COMPARISON:  Multiple exams, including 01/07/2018 FINDINGS: Despite efforts by the technologist and patient, motion artifact is present on today's exam and could not be eliminated. This reduces  exam sensitivity and specificity. Cardiovascular: No filling defect is identified in the pulmonary arterial tree to suggest pulmonary embolus. There is narrowing of the right upper lobe pulmonary artery due to extrinsic mass effect from tumor. Difficult to be certain that the tumor is not invading the right upper lobe pulmonary artery given the complete 360 degree engulfing of the vessel by the tumor, but I do not see obvious filling defect within the narrowed segment of pulmonary artery traversing the tumor. The contour is also fairly similar to 01/07/18. Mediastinum/Nodes: Bulky right paratracheal tumor as before, measuring up to 4.6 cm in short axis, formerly 4.8 cm. A prevascular node measures 1.6 cm in short axis on image 44/6, previously 2.1 cm. Right hilar node 1.9 cm in short axis on image 50/6, formerly 2.0 cm. Conglomerate right supraclavicular adenopathy difficult to separate from the sternocleidomastoid muscle, less sharply defined but mildly reduced in overall size compared to prior exam. A left supraclavicular node measures 2.2 cm in short axis on image 9/6, formerly the same. Bilateral pathologic axillary adenopathy. An index left axillary node measures 2.5 cm in short axis on image 31/6, significantly worsened, previously 0.9 cm. Right axillary and subpectoral adenopathy to prior. Lungs/Pleura: Paraseptal emphysema. The right upper lobe lung nodule measures up to 0.8 cm in short axis on image 43/12, previously 1.3 cm by my measurements. New ground-glass density band in the right upper lobe for example on image 55/12, likely from alveolitis, less likely a new focus of low-grade adenocarcinoma. Small amount of frothy fluid in the trachea and right mainstem bronchus. Upper Abdomen: Unremarkable Musculoskeletal: Unremarkable Review of the MIP images confirms the above findings. IMPRESSION: 1. No filling defect is identified in the pulmonary arterial tree to suggest pulmonary embolus. Stable degree of mass  effect on the right upper lobe pulmonary artery by the right mediastinal tumor. 2. Mixed appearance of the tumor burden, on balance the tumor burden is mildly reduced. Specifically, the bulky mediastinal tumor is mildly reduced in size; the right upper lobe bandlike pulmonary nodule is reduced in size; and mediastinal and right supraclavicular adenopathy is mildly reduced. However, left axillary adenopathy is significantly increased. 3. Other imaging findings of potential clinical significance: Emphysema (ICD10-J43.9). Mild focus of alveolitis in the right upper lobe. Electronically Signed   By: WCindra EvesD.  On: 02/25/2018 18:21   Dg Foot Complete Right  Result Date: 02/25/2018 CLINICAL DATA:  Necrosis of the great toe, second toe and third toe. EXAM: RIGHT FOOT COMPLETE - 3+ VIEW COMPARISON:  None. FINDINGS: Pronounced deficiency of the soft tissue of the distal toes, most notable at the second and third toes. No plain radiographic evidence of osteomyelitis. No recent fracture. Old healed fracture of the proximal phalanx of the fifth toe. IMPRESSION: Pronounced soft tissue deficiency of the distal toes. No plain radiographic evidence of osteomyelitis. Electronically Signed   By: Nelson Chimes M.D.   On: 02/25/2018 16:56    Pending Labs Unresulted Labs (From admission, onward)    Start     Ordered   02/26/18 0140  Troponin I (q 6hr x 3)  Now then every 6 hours,   R     02/26/18 0139   02/26/18 0111  MRSA PCR Screening  Once,   R    Question:  Patient immune status  Answer:  Immunocompromised   02/26/18 0110   02/25/18 1324  Culture, blood (Routine x 2)  BLOOD CULTURE X 2,   STAT     02/25/18 1324          Vitals/Pain Today's Vitals   02/26/18 0622 02/26/18 0630 02/26/18 0700 02/26/18 0800  BP: 135/79 137/76 140/83 (!) 149/78  Pulse: (!) 133 (!) 131 (!) 131 (!) 128  Resp: 20     Temp:      TempSrc: Oral     SpO2: 96% 94% 94% 94%  Weight:      Height:      PainSc: 6         Isolation Precautions No active isolations  Medications Medications  iopamidol (ISOVUE-370) 76 % injection (has no administration in time range)  metroNIDAZOLE (FLAGYL) IVPB 500 mg (0 mg Intravenous Stopped 02/26/18 0429)  methadone (DOLOPHINE) tablet 20 mg (0 mg Oral Hold 02/26/18 0531)  fluconazole (DIFLUCAN) tablet 100 mg (has no administration in time range)  DULoxetine (CYMBALTA) DR capsule 60 mg (has no administration in time range)  OLANZapine (ZYPREXA) tablet 5 mg (5 mg Oral Given 02/26/18 0207)  LORazepam (ATIVAN) tablet 1 mg (1 mg Oral Given 02/26/18 0207)  magic mouthwash (has no administration in time range)  pantoprazole (PROTONIX) EC tablet 40 mg (has no administration in time range)  pantoprazole (PROTONIX) EC tablet 40 mg (has no administration in time range)  polyethylene glycol (MIRALAX / GLYCOLAX) packet 17 g (has no administration in time range)  senna-docusate (Senokot-S) tablet 2 tablet (0 tablets Oral Hold 02/26/18 0429)  gabapentin (NEURONTIN) capsule 400 mg (400 mg Oral Given 02/26/18 0207)  methocarbamol (ROBAXIN) tablet 750 mg (750 mg Oral Given 02/26/18 0207)  acetaminophen (TYLENOL) tablet 650 mg (has no administration in time range)    Or  acetaminophen (TYLENOL) suppository 650 mg (has no administration in time range)  ondansetron (ZOFRAN) tablet 4 mg (has no administration in time range)    Or  ondansetron (ZOFRAN) injection 4 mg (has no administration in time range)  fondaparinux (ARIXTRA) injection 7.5 mg (has no administration in time range)  0.9 %  sodium chloride infusion ( Intravenous Restarted 02/26/18 0640)  thiamine (B-1) injection 100 mg (has no administration in time range)  HYDROmorphone (DILAUDID) injection 1 mg (1 mg Intravenous Given 02/26/18 0141)  LORazepam (ATIVAN) tablet 0.5 mg (has no administration in time range)  vancomycin (VANCOCIN) 1,750 mg in sodium chloride 0.9 % 500 mL IVPB (has no administration in  time range)  ceFEPIme  (MAXIPIME) 1 g in sodium chloride 0.9 % 100 mL IVPB (has no administration in time range)  magnesium sulfate IVPB 4 g 100 mL ( Intravenous Restarted 02/26/18 0640)  sodium chloride 0.9 % bolus 1,000 mL (0 mLs Intravenous Stopped 02/25/18 1820)  iopamidol (ISOVUE-370) 76 % injection 100 mL (100 mLs Intravenous Contrast Given 02/25/18 1744)  HYDROmorphone (DILAUDID) injection 1 mg (1 mg Intravenous Given 02/25/18 1854)  sodium chloride 0.9 % bolus 1,000 mL (0 mLs Intravenous Stopped 02/25/18 2221)  HYDROmorphone (DILAUDID) injection 1 mg (1 mg Intravenous Given 02/25/18 2000)  ceFEPIme (MAXIPIME) 2 g in sodium chloride 0.9 % 100 mL IVPB (0 g Intravenous Stopped 02/26/18 0308)  vancomycin (VANCOCIN) IVPB 1000 mg/200 mL premix (0 mg Intravenous Stopped 02/26/18 0604)  sodium chloride 0.9 % bolus 500 mL (0 mLs Intravenous Stopped 02/26/18 0308)    Mobility walks

## 2018-02-26 NOTE — ED Notes (Signed)
Pt lost IV access

## 2018-02-26 NOTE — Progress Notes (Signed)
6 beat run of V-tach noted on 5 lead EKG. Upon assessing patient, patient was asleep and resting confortably. Will continue to monitor.

## 2018-02-26 NOTE — Progress Notes (Signed)
CRITICAL VALUE STICKER  CRITICAL VALUE: Troponin 0.03  DATE & TIME NOTIFIED: 02/26/18 11:04 AM  MD NOTIFIED: MD [place] Crane   TIME OF NOTIFICATION:02/26/18 11:04 AM  RESPONSE: waiting orders at this time.

## 2018-02-26 NOTE — Progress Notes (Signed)
Patient assessed per RT protocol. Scheduled Duoneb TID and Albuterol Q4 PRN ordered.

## 2018-02-26 NOTE — Progress Notes (Signed)
PT Cancellation Note  Patient Details Name: Tony [name] MRN: [id number] DOB: 03/20/1966   Cancelled Treatment:    Reason Eval/Treat Not Completed: Patient not medically ready. Increased HR. To be admitted .    Claretha Cooper 02/26/2018, 7:31 AM  [place] Pager [phone] Office [phone]

## 2018-02-26 NOTE — Consult Note (Addendum)
Consultation Note Date: 02/26/2018   Patient Name: Tony [name]  DOB: 05-May-1966  MRN: [id number]  Age / Sex: 52 y.o., male  PCP: Dorthy Cooler, [place], MD Referring Physician: Kayleen Memos, DO  Reason for Consultation: Establishing goals of care and Pain control  HPI/Patient Profile: 52 y.o. male  with past medical history of HTN, HLD, arterial occlusive disease, PVD, IBS, chronic back pain, anxiety, depressionadmitted on 7/8/2019with increased drainage from incision sites and with critical limb ischemia of left lower extremity.S/P Right Aortobifemoral bypass and fem-pop bypass 6/6 and found to thrombectomy with fem-pop bypass on left 6/22. Recent prolonged hospitalization complicated by uncontrolled pain likely s/t newly diagnosed metastatic adenocarcinoma with significant lymphadenopathy to neck and SVC syndrome. Also with venous thrombus of right subclavian, brachiocephalic, and IJ veins. He just completed his course of radiation therapy Friday 02/22/18 and received one dose of chemotherapy 02/13/2018 Admitted on 02/25/2018 with fever, cough, leg pain and admitted with dehydration and likely cellulitis to right neck wounds. R foot gangrene actually appears improved to me from when I saw him last 01/10/18 and does not appear to be infected. He also has decreased appetite with severe oral candida likely into esophagus given his reports of painful swallowing.   Clinical Assessment and Goals of Care: I met today with Layten and no family/visitors at bedside. Callan is well known to myself as well as Rhea Pink, DO. He is miserable and moaning, coughing, and barely understandable speech from secretions when I first arrive. He is tearful and scared. After IV dilaudid (for 8/10 pain) and neb given he seems like a new person; he is able to sit up and engage in conversation with me.   Yohann tells me that he went to [facility]  with his family and everything become horrible and they left early to bring him back to [facility] [employer]. He was miserable. He was having fevers and no appetite. He complains of pain in his neck and chest which was relieved to a tolerable level after dilaudid and neb treatment. He does not believe he can tolerate even liquid methadone so we discussed alternative plan for pain management utilizing scheduled and prn IV dilaudid for now. He understands. I also discussed with RN this plan. I do not want him to go through withdraws on top of all his other health issues.   I spent some time listening and offering emotional support. Will change his medications to IV given his inability to swallow pills s/t pain. Will follow up tomorrow.   Primary Decision Maker PATIENT    SUMMARY OF RECOMMENDATIONS   - Will continue to work on symptoms.  - Will continue to follow for support.  - Will discuss GOC as we are able. He has been moving forward with full aggressive care.   Code Status/Advance Care Planning:  Full code   Symptom Management:   Consider IV Zyprexa for depression.   Ativan changed from po to IV prn anxiety.   IV Pepcid for PPI prophylaxis.   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   Continue Magic mouthwash BID. Considered lidocaine but likely too caustic to mucosa given possibility of radiation esophagitis + candida.   Sucralfate suspension TID AC and QHS.   Pain s/t cancer and radiation burns:   D/C methadone (while not tolerating po).  IV Dilaudid 1 mg q4h scheduled and q2h prn.   Robaxin changed po to IV.   Decadron 4 mg IV daily.    Palliative Prophylaxis:   Aspiration, Bowel Regimen, Delirium Protocol, Frequent Pain Assessment and Oral Care  Additional Recommendations (Limitations, Scope, Preferences):  Full Scope Treatment  Psycho-social/Spiritual:   Desire for further Chaplaincy support:yes  Additional  Recommendations: Caregiving  Support/Resources and Grief/Bereavement Support  Prognosis:   Overall prognosis is extremely poor with very advanced mets lung cancer.   Discharge Planning: To Be Determined      Primary Diagnoses: Present on Admission: . Dehydration . Aortoiliac occlusive disease ([place]) . Essential hypertension . Gangrene of right foot ([place]) . Hyperlipidemia LDL goal <70 . Malignant neoplasm of upper lobe of right lung ([place]) . Mass in neck . Pain of right lower extremity due to ischemia . SVC syndrome . AKI (acute kidney injury) ([place]) . Sepsis ([place]) . Cellulitis   I have reviewed the medical record, interviewed the patient and family, and examined the patient. The following aspects are pertinent.  Past Medical History:  Diagnosis Date  . Anxiety   . Arterial occlusive disease    multilevel arterial occlusive disease/notes 11/13/2017  . Arthritis    "left knee" (11/13/2017)  . Cancer ([place])    stage 4 lung cancer  . Chronic back pain   . Chronic lower back pain   . Depression   . Gastric ulcer due to Helicobacter pylori 8563  . GERD (gastroesophageal reflux disease)   . High cholesterol   . History of blood transfusion   . Hypertension   . IBS (irritable bowel syndrome)   . Mesenteric adenitis 2011   presumed/H&P  . Migraines    "use to suffer migraines years ago" (11/13/2017)  . Peripheral vascular disease ([place])   . Pneumonia 2011   "wife states he didn't have pneumonia"  . Ulcer    Social History   Socioeconomic History  . Marital status: Married    Spouse name: Not on file  . Number of children: 3  . Years of education: Not on file  . Highest education level: Not on file  Occupational History  . Occupation: Admin. Assistant    Employer: [employer]  Social Needs  . Financial resource strain: Not on file  . Food insecurity:    Worry: Not on file    Inability: Not on file  . Transportation needs:    Medical: Not on file    Non-medical:  Not on file  Tobacco Use  . Smoking status: Former Smoker    Packs/day: 1.00    Years: 24.00    Pack years: 24.00    Types: Cigarettes    Start date: 11/28/2017    Last attempt to quit: 12/04/2017    Years since quitting: 0.2  . Smokeless tobacco: Never Used  Substance and Sexual Activity  . Alcohol use: Not Currently  . Drug use: Not Currently    Types: Marijuana    Comment: 11/13/2017  "last drug use was in ~ 1987 "  . Sexual activity: Not on file  Lifestyle  . Physical activity:    Days per week: Not  on file    Minutes per session: Not on file  . Stress: Not on file  Relationships  . Social connections:    Talks on phone: Not on file    Gets together: Not on file    Attends religious service: Not on file    Active member of club or organization: Not on file    Attends meetings of clubs or organizations: Not on file    Relationship status: Not on file  Other Topics Concern  . Not on file  Social History Narrative   Lives with wife and two children.  Administrator at [employer] History  Problem Relation Age of Onset  . Hypertension Mother   . Heart disease Mother   . Hypertension Father   . Coronary artery disease Father 76  . Heart attack Brother 31  . Coronary artery disease Brother   . Heart disease Brother   . Diabetes Neg Hx   . Stroke Neg Hx    Scheduled Meds: . [START ON 02/27/2018] Chlorhexidine Gluconate Cloth  6 each Topical Q0600  . DULoxetine  60 mg Oral Daily  . fondaparinux  7.5 mg Subcutaneous q1800  . gabapentin  400 mg Oral BID  .  HYDROmorphone (DILAUDID) injection  1 mg Intravenous Q4H  . ipratropium-albuterol  3 mL Nebulization TID  . magic mouthwash  5-10 mL Oral BID  . metoprolol tartrate  2.5 mg Intravenous TID  . mupirocin ointment  1 application Nasal BID  . OLANZapine  5 mg Oral QHS  . polyethylene glycol  17 g Oral Daily  . senna-docusate  2 tablet Oral BID  . thiamine injection  100 mg Intravenous Daily  . triamcinolone cream    Topical BID   Continuous Infusions: . sodium chloride 250 mL (02/26/18 1743)  . ceFEPime (MAXIPIME) IV Stopped (02/26/18 1520)  . dextrose 5 % and 0.45% NaCl 50 mL/hr at 02/26/18 1734  . famotidine (PEPCID) IV    . [START ON 02/27/2018] fluconazole (DIFLUCAN) IV    . methocarbamol (ROBAXIN) IV    . metronidazole Stopped (02/26/18 1214)  . vancomycin     PRN Meds:.sodium chloride, acetaminophen **OR** acetaminophen, albuterol, bisacodyl, HYDROmorphone (DILAUDID) injection, LORazepam, ondansetron **OR** ondansetron (ZOFRAN) IV Allergies  Allergen Reactions  . Pork-Derived Products Other (See Comments)    UNSPECIFIED REASON - Pt doesn't eat pork  . Shellfish-Derived Products Other (See Comments)    Doesn't want to eat  . Morphine And Related Other (See Comments)    Hallucinations   Review of Systems  Constitutional: Positive for activity change, appetite change and fatigue.  HENT: Positive for sore throat and trouble swallowing.   Respiratory: Positive for cough and shortness of breath.   Gastrointestinal: Negative for constipation.  Musculoskeletal: Positive for neck pain.  Neurological: Positive for weakness.  Psychiatric/Behavioral: The patient is nervous/anxious.     Physical Exam  Constitutional: He is oriented to person, place, and time. He appears well-developed.  HENT:  Head: Normocephalic and atraumatic.  Neck: Muscular tenderness present. Edema and erythema present.  Skin breakdown s/t radiation burns. R neck wound drsg changed with slight purulent drainage.   Cardiovascular: Tachycardia present.  Pulmonary/Chest: Effort normal. No accessory muscle usage. No tachypnea. No respiratory distress.  Increased oral secretions but able to utilize suction once pain medication given  Abdominal: Normal appearance.  Neurological: He is alert and oriented to person, place, and time.  Nursing note and vitals reviewed.   Vital Signs: BP (!) 161/73  Pulse (!) 127   Temp (!)  100.8 F (38.2 C) (Oral)   Resp (!) 25   Ht 6' (1.829 m)   Wt 70.1 kg   SpO2 93%   BMI 20.96 kg/m  Pain Scale: 0-10   Pain Score: 8    SpO2: SpO2: 93 % O2 Device:SpO2: 93 % O2 Flow Rate: .   IO: Intake/output summary:   Intake/Output Summary (Last 24 hours) at 02/26/2018 1743 Last data filed at 02/26/2018 1559 Gross per 24 hour  Intake 495.65 ml  Output 600 ml  Net -104.35 ml    LBM: Last BM Date: 02/25/18 Baseline Weight: Weight: 69.4 kg Most recent weight: Weight: 70.1 kg     Palliative Assessment/Data: 30%     Time In: 1600 Time Out: 1745 Time Total: 105 Greater than 50%  of this time was spent counseling and coordinating care related to the above assessment and plan.  Signed by: Vinie Sill, NP Palliative Medicine Team Pager # [phone] (M-F 8a-5p) Team Phone # [phone] (Nights/Weekends)

## 2018-02-26 NOTE — Progress Notes (Signed)
PROGRESS NOTE  Tony [name] OFB:[id number] DOB: Apr 09, 1966 DOA: 02/25/2018 PCP: [name], MD  HPI/Recap of past 24 hours:  Tony [name] is a 52 y.o. male with medical history significant of metastatic adenocarcinoma of the left lung currently sp Chemotherapy finished radiation therapy on Friday, 02/22/2018, PVD, right foot gangrene, chronic back pain, depression GERD, HLD, HTN, SVC syndrome who presented with multiple complaints including fever chills cough productive yellow sputum urinary retention significant constipation and right leg pain. Patient was supposed to get chemotherapy on 11 September but felt to be too ill to have chemo.  Instead he was started doxycycline for gangrenous right foot. Chronic shortness of breath but his cough has been getting worse with increased secretions.  Concern for dehydration.  TRH asked to admit.  02/26/2018: Patient seen and examined at his bedside.  He is alert oriented x3.  He reports moderate to severe pain at his neck, at radiation site.   Patient has multiple wounds including gangrene of right foot.  Wound care specialist consulted and following.  Assessment/Plan: Active Problems:   Essential hypertension   Pain of right lower extremity due to ischemia   Hyperlipidemia LDL goal <70   Aortoiliac occlusive disease (H[facility])   Gangrene of right foot (HCC)   AKI (acute kidney injury) ([place])   Malignant neoplasm of upper lobe of right lung (H[facility])   Mass in neck   SVC syndrome   Dehydration   Sepsis (HCC)   Cellulitis  Dehydration in the setting of advanced metastatic adenocarcinoma of left lung Rehydrate with IV fluid Monitor his fluid status Monitor electrolytes  Sepsis secondary to cellulitis post radiation Continue broad-spectrum IV antibiotics IV fluid Continue pain management Continue bowel regiment Continue to monitor fever curve Continue to monitor WBC Obtain CBC in the morning  Multiple wounds including right foot  gangrene Wound care specialist following Continue wound care specialist recommendations  Anemia of chronic disease Most likely secondary to chemotherapy versus orders Hemoglobin dropped from 9.6-7.8 Could also be dilutional No sign of overt bleeding Repeat CBC in the morning  Essential hypertension Resume home antihypertensive medications Continue to monitor vital signs  Sinus tachycardia Possibly secondary to dehydration Continue to monitor on telemetry  History of DVT Post IVC filter Continue fondaparinux  Metastatic adenocarcinoma of left lung Oncology, with radiation oncology outpatient  Goals of care Palliative care consulted to establish goals of care and for symptoms management   Code Status: Full code  Family Communication: None at bedside  Disposition Plan: Home in 2 to 3 days when clinically improved   Consultants:  Palliative care team  Procedures:  None  Antimicrobials:  IV cefepime and IV vancomycin  DVT prophylaxis: Fondaparinux   Objective: Vitals:   02/26/18 0630 02/26/18 0700 02/26/18 0800 02/26/18 0835  BP: 137/76 140/83 (!) 149/78 140/77  Pulse: (!) 131 (!) 131 (!) 128 (!) 124  Resp:    20  Temp:    100.3 F (37.9 C)  TempSrc:    Oral  SpO2: 94% 94% 94% 98%  Weight:      Height:        Intake/Output Summary (Last 24 hours) at 02/26/2018 1032 Last data filed at 02/26/2018 0851 Gross per 24 hour  Intake 100 ml  Output 600 ml  Net -500 ml   Filed Weights   02/25/18 1320  Weight: 69.4 kg    Exam:  . General: 52 y.o. year-old male well developed well nourished in no acute distress.  Somnolent  but responds to questions appropriately.  Oriented x3. . Cardiovascular: Regular rate and rhythm with no rubs or gallops.  No thyromegaly or JVD noted.   Marland Kitchen Respiratory: Clear to auscultation with no wheezes or rales. Good inspiratory effort. . Abdomen: Soft nontender nondistended with normal bowel sounds x4 quadrants. . Skin:  Radiation scarring noted at right side of the chest and neck with some cellulitis.  Gangrene affecting the lower extremities. Marland Kitchen Psychiatry: Mood is appropriate for condition and setting   Data Reviewed: CBC: Recent Labs  Lab 02/20/18 0912 02/25/18 1344 02/26/18 0500  WBC 5.5 7.5 6.9  NEUTROABS 5.2 7.0  --   HGB 8.2* 9.6* 7.8*  HCT 26.9* 31.1* 24.7*  MCV 91.2 90.9 90.5  PLT 219 228 951   Basic Metabolic Panel: Recent Labs  Lab 02/20/18 0912 02/25/18 1344 02/26/18 0500  NA 137 140 136  K 4.5 4.1 3.9  CL 100 100 103  CO2 26 25 19*  GLUCOSE 84 79 77  BUN 17 28* 17  CREATININE 0.67 1.00 0.78  CALCIUM 9.6 9.6 8.4*  MG  --   --  1.6*  PHOS  --   --  3.0   GFR: Estimated Creatinine Clearance: 109.6 mL/min (by C-G formula based on SCr of 0.78 mg/dL). Liver Function Tests: Recent Labs  Lab 02/20/18 0912 02/25/18 1344 02/26/18 0500  AST 17 22 21   ALT 15 18 15   ALKPHOS 122 132* 106  BILITOT <0.2* 0.9 1.0  PROT 6.8 7.3 6.1*  ALBUMIN 2.3* 2.7* 2.1*   No results for input(s): LIPASE, AMYLASE in the last 168 hours. No results for input(s): AMMONIA in the last 168 hours. Coagulation Profile: Recent Labs  Lab 02/25/18 1344  INR 1.10   Cardiac Enzymes: Recent Labs  Lab 02/26/18 0145  TROPONINI <0.03   BNP (last 3 results) No results for input(s): PROBNP in the last 8760 hours. HbA1C: No results for input(s): HGBA1C in the last 72 hours. CBG: No results for input(s): GLUCAP in the last 168 hours. Lipid Profile: No results for input(s): CHOL, HDL, LDLCALC, TRIG, CHOLHDL, LDLDIRECT in the last 72 hours. Thyroid Function Tests: Recent Labs    02/26/18 0500  TSH 1.412   Anemia Panel: No results for input(s): VITAMINB12, FOLATE, FERRITIN, TIBC, IRON, RETICCTPCT in the last 72 hours. Urine analysis:    Component Value Date/Time   COLORURINE YELLOW 02/25/2018 1928   APPEARANCEUR CLEAR 02/25/2018 1928   LABSPEC >1.046 (H) 02/25/2018 1928   PHURINE 5.0  02/25/2018 1928   GLUCOSEU NEGATIVE 02/25/2018 1928   HGBUR NEGATIVE 02/25/2018 1928   HGBUR negative 07/26/2009 1050   BILIRUBINUR NEGATIVE 02/25/2018 1928   KETONESUR 20 (A) 02/25/2018 1928   PROTEINUR 30 (A) 02/25/2018 1928   UROBILINOGEN 0.2 08/21/2011 1911   NITRITE NEGATIVE 02/25/2018 1928   LEUKOCYTESUR NEGATIVE 02/25/2018 1928   Sepsis Labs: @LABRCNTIP (procalcitonin:4,lacticidven:4)  )No results found for this or any previous visit (from the past 240 hour(s)).    Studies: Dg Chest 2 View  Result Date: 02/25/2018 CLINICAL DATA:  Cough with fever and chills EXAM: CHEST - 2 VIEW COMPARISON:  Chest CT January 07, 2018; chest radiograph December 16, 2017 FINDINGS: There remains adenopathy throughout the right azygos and paratracheal regions, similar to most recent study. There is no edema or consolidation. Heart size and pulmonary vascularity are normal. No evident bone lesions. IMPRESSION: Right-sided adenopathy, unchanged from recent study. Other areas of adenopathy seen on prior CT are not appreciable by radiography. No edema or  consolidation. Previously noted mass in the posterior segment right upper lobe on chest CT is not appreciable by radiography. Heart size normal. Electronically Signed   By: Lowella Grip III M.D.   On: 02/25/2018 14:12   Ct Angio Chest Pe W And/or Wo Contrast  Result Date: 02/25/2018 CLINICAL DATA:  Productive cough. Metastatic cancer with involvement of the right upper lobe EXAM: CT ANGIOGRAPHY CHEST WITH CONTRAST TECHNIQUE: Multidetector CT imaging of the chest was performed using the standard protocol during bolus administration of intravenous contrast. Multiplanar CT image reconstructions and MIPs were obtained to evaluate the vascular anatomy. CONTRAST:  118mL ISOVUE-370 IOPAMIDOL (ISOVUE-370) INJECTION 76% COMPARISON:  Multiple exams, including 01/07/2018 FINDINGS: Despite efforts by the technologist and patient, motion artifact is present on today's exam and  could not be eliminated. This reduces exam sensitivity and specificity. Cardiovascular: No filling defect is identified in the pulmonary arterial tree to suggest pulmonary embolus. There is narrowing of the right upper lobe pulmonary artery due to extrinsic mass effect from tumor. Difficult to be certain that the tumor is not invading the right upper lobe pulmonary artery given the complete 360 degree engulfing of the vessel by the tumor, but I do not see obvious filling defect within the narrowed segment of pulmonary artery traversing the tumor. The contour is also fairly similar to 01/07/18. Mediastinum/Nodes: Bulky right paratracheal tumor as before, measuring up to 4.6 cm in short axis, formerly 4.8 cm. A prevascular node measures 1.6 cm in short axis on image 44/6, previously 2.1 cm. Right hilar node 1.9 cm in short axis on image 50/6, formerly 2.0 cm. Conglomerate right supraclavicular adenopathy difficult to separate from the sternocleidomastoid muscle, less sharply defined but mildly reduced in overall size compared to prior exam. A left supraclavicular node measures 2.2 cm in short axis on image 9/6, formerly the same. Bilateral pathologic axillary adenopathy. An index left axillary node measures 2.5 cm in short axis on image 31/6, significantly worsened, previously 0.9 cm. Right axillary and subpectoral adenopathy to prior. Lungs/Pleura: Paraseptal emphysema. The right upper lobe lung nodule measures up to 0.8 cm in short axis on image 43/12, previously 1.3 cm by my measurements. New ground-glass density band in the right upper lobe for example on image 55/12, likely from alveolitis, less likely a new focus of low-grade adenocarcinoma. Small amount of frothy fluid in the trachea and right mainstem bronchus. Upper Abdomen: Unremarkable Musculoskeletal: Unremarkable Review of the MIP images confirms the above findings. IMPRESSION: 1. No filling defect is identified in the pulmonary arterial tree to suggest  pulmonary embolus. Stable degree of mass effect on the right upper lobe pulmonary artery by the right mediastinal tumor. 2. Mixed appearance of the tumor burden, on balance the tumor burden is mildly reduced. Specifically, the bulky mediastinal tumor is mildly reduced in size; the right upper lobe bandlike pulmonary nodule is reduced in size; and mediastinal and right supraclavicular adenopathy is mildly reduced. However, left axillary adenopathy is significantly increased. 3. Other imaging findings of potential clinical significance: Emphysema (ICD10-J43.9). Mild focus of alveolitis in the right upper lobe. Electronically Signed   By: Van Clines M.D.   On: 02/25/2018 18:21   Dg Foot Complete Right  Result Date: 02/25/2018 CLINICAL DATA:  Necrosis of the great toe, second toe and third toe. EXAM: RIGHT FOOT COMPLETE - 3+ VIEW COMPARISON:  None. FINDINGS: Pronounced deficiency of the soft tissue of the distal toes, most notable at the second and third toes. No plain radiographic evidence of  osteomyelitis. No recent fracture. Old healed fracture of the proximal phalanx of the fifth toe. IMPRESSION: Pronounced soft tissue deficiency of the distal toes. No plain radiographic evidence of osteomyelitis. Electronically Signed   By: Nelson Chimes M.D.   On: 02/25/2018 16:56    Scheduled Meds: . DULoxetine  60 mg Oral Daily  . fluconazole  100 mg Oral Daily  . fondaparinux  7.5 mg Subcutaneous q1800  . gabapentin  400 mg Oral BID  . magic mouthwash  5-10 mL Oral BID  . methadone  20 mg Oral Q8H  . methocarbamol  750 mg Oral BID  . OLANZapine  5 mg Oral QHS  . pantoprazole  40 mg Oral Daily  . polyethylene glycol  17 g Oral Daily  . senna-docusate  2 tablet Oral BID  . thiamine injection  100 mg Intravenous Daily  . triamcinolone cream   Topical BID    Continuous Infusions: . sodium chloride 125 mL/hr at 02/26/18 0640  . ceFEPime (MAXIPIME) IV    . metronidazole Stopped (02/26/18 0429)  .  vancomycin       LOS: 1 day     Kayleen Memos, MD [facility] Pager [phone]  If 7PM-7AM, please contact night-coverage [email or web address] Password [facility] 02/26/2018, 10:32 AM

## 2018-02-26 NOTE — Progress Notes (Signed)
Pharmacy Antibiotic Note  Tony [name] is a 52 y.o. male admitted on 02/25/2018 with sepsis.  Pharmacy has been consulted for Vancomycin, cefepime dosing.  Plan: Cefepime 2gm iv x1, then 1gm iv q8hr  Vancomycin 1gm iv x1, then 1750 mg iv q24hr-next dose at 1800 9-17  Goal AUC = 400 - 500 for all indications, except meningitis (goal AUC > 500 and Cmin 15-20 mcg/mL)   Height: 6' (182.9 cm) Weight: 153 lb (69.4 kg) IBW/kg (Calculated) : 77.6  Temp (24hrs), Avg:98.5 F (36.9 C), Min:98.1 F (36.7 C), Max:98.9 F (37.2 C)  Recent Labs  Lab 02/20/18 0912 02/25/18 1344 02/25/18 1354 02/25/18 1721 02/25/18 2316  WBC 5.5 7.5  --   --   --   CREATININE 0.67 1.00  --   --   --   LATICACIDVEN  --   --  1.47 1.15 0.8    Estimated Creatinine Clearance: 87.7 mL/min (by C-G formula based on SCr of 1 mg/dL).    Allergies  Allergen Reactions  . Pork-Derived Products Other (See Comments)    UNSPECIFIED REASON - Pt doesn't eat pork  . Shellfish-Derived Products Other (See Comments)    Doesn't want to eat  . Morphine And Related Other (See Comments)    Hallucinations    Antimicrobials this admission: Vancomycin 02/26/2018 >> Cefepime 02/26/2018 >>   Dose adjustments this admission:   Microbiology results:   Thank you for allowing pharmacy to be a part of this patient's care.  Nani Skillern Crowford 02/26/2018 5:12 AM
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
## 2018-02-26 NOTE — Progress Notes (Signed)
Pt c/o pain when swallowing his pills and requested that all medication that could be given IV be changed to IV. RN relayed this information to MD [facility] via Text page.

## 2018-02-26 NOTE — Progress Notes (Signed)
OT Cancellation Note  Patient Details Name: LATRELL POTEMPA MRN: [id number] DOB: [birth year]   Cancelled Treatment:    Reason Eval/Treat Not Completed: Medical issues which prohibited therapy.  Pt in ED, increased HR. Will check another time.  Brylie Sneath 02/26/2018, 7:36 AM  Lesle Chris, OTR/L Acute Rehabilitation Services [phone] [facility] pager [phone] office 02/26/2018

## 2018-02-27 ENCOUNTER — Inpatient Hospital Stay: Payer: BLUE CROSS/BLUE SHIELD

## 2018-02-27 ENCOUNTER — Inpatient Hospital Stay: Payer: BLUE CROSS/BLUE SHIELD | Admitting: Hematology

## 2018-02-27 ENCOUNTER — Other Ambulatory Visit: Payer: Self-pay

## 2018-02-27 DIAGNOSIS — I10 Essential (primary) hypertension: Secondary | ICD-10-CM

## 2018-02-27 LAB — CBC
HCT: 23.5 % — ABNORMAL LOW (ref 39.0–52.0)
Hemoglobin: 7.3 g/dL — ABNORMAL LOW (ref 13.0–17.0)
MCH: 27.9 pg (ref 26.0–34.0)
MCHC: 31.1 g/dL (ref 30.0–36.0)
MCV: 89.7 fL (ref 78.0–100.0)
Platelets: 176 10*3/uL (ref 150–400)
RBC: 2.62 MIL/uL — ABNORMAL LOW (ref 4.22–5.81)
RDW: 17.2 % — AB (ref 11.5–15.5)
WBC: 6.9 10*3/uL (ref 4.0–10.5)

## 2018-02-27 LAB — GLUCOSE, CAPILLARY: GLUCOSE-CAPILLARY: 116 mg/dL — AB (ref 70–99)

## 2018-02-27 LAB — PREPARE RBC (CROSSMATCH)

## 2018-02-27 MED ORDER — FLUCONAZOLE IN SODIUM CHLORIDE 400-0.9 MG/200ML-% IV SOLN
400.0000 mg | Freq: Every day | INTRAVENOUS | Status: DC
  Administered 2018-02-27 – 2018-03-01 (×3): 400 mg via INTRAVENOUS
  Filled 2018-02-27 (×4): qty 200

## 2018-02-27 MED ORDER — BOOST / RESOURCE BREEZE PO LIQD CUSTOM
1.0000 | Freq: Two times a day (BID) | ORAL | Status: DC
  Administered 2018-02-27 – 2018-03-01 (×4): 1 via ORAL

## 2018-02-27 MED ORDER — ORAL CARE MOUTH RINSE
15.0000 mL | OROMUCOSAL | Status: DC
  Administered 2018-02-27 – 2018-03-01 (×5): 15 mL via OROMUCOSAL

## 2018-02-27 MED ORDER — SODIUM CHLORIDE 0.9 % IV SOLN: 300.0000 mg | INTRAVENOUS | Status: DC

## 2018-02-27 MED ORDER — GI COCKTAIL ~~LOC~~: 30.0000 mL | Freq: Once | ORAL | Status: DC

## 2018-02-27 MED ORDER — METHADONE HCL 10 MG PO TABS
20.0000 mg | ORAL_TABLET | Freq: Three times a day (TID) | ORAL | Status: DC
  Administered 2018-02-27 – 2018-03-01 (×6): 20 mg via ORAL
  Filled 2018-02-27 (×6): qty 2

## 2018-02-27 MED ORDER — SODIUM CHLORIDE 0.9% IV SOLUTION: Freq: Once | INTRAVENOUS | Status: AC

## 2018-02-27 MED ORDER — FUROSEMIDE 10 MG/ML IJ SOLN
40.0000 mg | Freq: Once | INTRAMUSCULAR | Status: AC
  Administered 2018-02-27: 40 mg via INTRAVENOUS
  Filled 2018-02-27: qty 4

## 2018-02-27 MED ORDER — ADULT MULTIVITAMIN W/MINERALS CH
1.0000 | ORAL_TABLET | Freq: Every day | ORAL | Status: DC
  Administered 2018-02-27 – 2018-03-01 (×3): 1 via ORAL
  Filled 2018-02-27 (×3): qty 1

## 2018-02-27 MED ORDER — CHLORHEXIDINE GLUCONATE 0.12% ORAL RINSE (MEDLINE KIT)
15.0000 mL | Freq: Two times a day (BID) | OROMUCOSAL | Status: DC
  Administered 2018-02-27 – 2018-02-28 (×3): 15 mL via OROMUCOSAL

## 2018-02-27 NOTE — Progress Notes (Signed)
Discussed patient's plan of care and symptom management with Palliative NP. Patient has been on long term suppressive fluconazole for severe oral pharyngeal/esophageal thrush-suspect there may be resistance to this given severity of his presentation on admission. Will switch to second line azole and monitor clinically for improvement.  [name], DO Palliative Medicine [phone]
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
## 2018-02-27 NOTE — Progress Notes (Signed)
Patient complaining of chest pain radiating to left underarm, "feeling achy". Patient diaphoretic, afebrile. CBG checked, 116. EKG completed: NSR. Patient refusing PO for heartburn, Zofran IV given. Dilaudid PRN given for 7/10 pain in neck and chest. Lamar Blinks, NP made aware of change in patient status. Will continue to monitor.

## 2018-02-27 NOTE — Progress Notes (Signed)
OT Cancellation Note  Patient Details Name: TILMON WISEHART MRN: [id number] DOB: Jul 18, 1965   Cancelled Treatment:    Reason Eval/Treat Not Completed: Medical issues which prohibited therapy  Pt will get 2 PRBC today.   Kristelle Cavallaro 02/27/2018, 7:26 AM  Lesle Chris, OTR/L Acute Rehabilitation Services [phone] [facility] pager [phone] office 02/27/2018
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
## 2018-02-27 NOTE — Progress Notes (Signed)
PROGRESS NOTE  Tony [name] GXQ:[id number] DOB: 05/07/1966 DOA: 02/25/2018 PCP: [name], MD  HPI/Recap of past 24 hours:  Tony [name] is a 52 y.o. male with medical history significant of metastatic adenocarcinoma of the left lung currently sp Chemotherapy finished radiation therapy on Friday, 02/22/2018, PVD, right foot gangrene, chronic back pain, depression GERD, HLD, HTN, SVC syndrome who presented with multiple complaints including fever chills cough productive yellow sputum urinary retention significant constipation and right leg pain. Patient was supposed to get chemotherapy on 11 September but felt to be too ill to have chemo.  Instead he was started doxycycline for gangrenous right foot. Chronic shortness of breath but his cough has been getting worse with increased secretions.  Concern for dehydration.  TRH asked to admit.  02/26/2018: Patient seen and examined at his bedside.  He is alert oriented x3.  He reports moderate to severe pain at his neck, at radiation site.   Patient has multiple wounds including gangrene of right foot.  Wound care specialist consulted and following.  02/27/2018: Patient seen and examined with his wife at bedside.  Reports chest pain since yesterday evening.  Twelve-lead EKG independently reviewed and revealed sinus rhythm with no acute or specific ST-T changes.  Suspect this is related to his post radiation cellulitis.  Also reports persistent odynophagia.  Palliative care team following and managing his symptoms.  Assessment/Plan: Active Problems:   Essential hypertension   Pain of right lower extremity due to ischemia   Hyperlipidemia LDL goal <70   Aortoiliac occlusive disease (HCC)   Gangrene of right foot (HCC)   AKI (acute kidney injury) ([place])   Malignant neoplasm of upper lobe of right lung (HCC)   Mass in neck   SVC syndrome   Dehydration   Sepsis ([place])   Cellulitis   Malignant neoplasm metastatic to lung ([place])  Dehydration in  the setting of advanced metastatic adenocarcinoma of left lung Continue to monitor electrolytes Continue gentle IV hydration  Sepsis secondary to post radiation cellulitis  Continue broad-spectrum IV antibiotics Continue pain management Continue bowel regiment Continue to monitor fever curve Continue to monitor WBC Obtain CBC in the morning  Chest pain, suspect secondary to post radiation cellulitis Twelve-lead EKG independently reviewed revealed sinus rhythm with rate of 72 and no acute or specific ST-T changes. Previous troponin less than 0.033  Persistent odynophagia on long-term suppressive fluconazole for severe oral pharyngeal/esophageal thrush Palliative care team managing On second line [place]  Multiple wounds including right foot gangrene Wound care specialist following Continue wound care specialist recommendations  Anemia of chronic disease/symptomatic anemia Most likely secondary to chemotherapy versus others Hemoglobin dropped to 7.3 Transfuse 2 unit PRBC Repeat CBC in the morning  Essential hypertension Resume home antihypertensive medications Continue to monitor vital signs  Sinus tachycardia Possibly secondary to dehydration Continue to monitor on telemetry  History of DVT Post IVC filter Continue fondaparinux  Metastatic adenocarcinoma of left lung Oncology, with radiation oncology outpatient  Goals of care Palliative care consulted and following to establish goals of care and for symptoms management.  Highly appreciated.  Chronic pain syndrome on methadone Patient has refused many of his p.o. medications Today he is agreeable to take his methadone to avoid withdrawal symptoms  Code Status: Full code  Family Communication: None at bedside  Disposition Plan: Home in 2 to 3 days when clinically improved   Consultants:  Palliative care team  Procedures:  None  Antimicrobials:  IV cefepime and IV  vancomycin  DVT  prophylaxis: Fondaparinux   Objective: Vitals:   02/27/18 0858 02/27/18 0900 02/27/18 1000 02/27/18 1200  BP:   120/68   Pulse:  98 (!) 107   Resp:  18 (!) 31   Temp:    97.8 F (36.6 C)  TempSrc:    Oral  SpO2: 96% 99% 95%   Weight:      Height:        Intake/Output Summary (Last 24 hours) at 02/27/2018 1248 Last data filed at 02/27/2018 1106 Gross per 24 hour  Intake 2134.94 ml  Output 550 ml  Net 1584.94 ml   Filed Weights   02/25/18 1320 02/26/18 1054 02/27/18 0424  Weight: 69.4 kg 70.1 kg 70.6 kg    Exam:  . General: 52 y.o. year-old male well-developed well-nourished appears uncomfortable due to moderate pain in his chest wall at the site of post radiation. . Cardiovascular: Regular rate and rhythm with no rubs or gallops.  No thyromegaly or JVD noted. Marland Kitchen Respiratory: Clear to auscultation with no wheezes or rales. Good inspiratory effort. . Abdomen: Soft nontender nondistended with normal bowel sounds x4 quadrants. . Skin: Radiation scarring noted at right side of the chest and neck with some cellulitis.  Gangrene affecting the lower extremities. Marland Kitchen Psychiatry: Mood is appropriate for condition and setting   Data Reviewed: CBC: Recent Labs  Lab 02/25/18 1344 02/26/18 0500 02/27/18 0318  WBC 7.5 6.9 6.9  NEUTROABS 7.0  --   --   HGB 9.6* 7.8* 7.3*  HCT 31.1* 24.7* 23.5*  MCV 90.9 90.5 89.7  PLT 228 171 858   Basic Metabolic Panel: Recent Labs  Lab 02/25/18 1344 02/26/18 0500  NA 140 136  K 4.1 3.9  CL 100 103  CO2 25 19*  GLUCOSE 79 77  BUN 28* 17  CREATININE 1.00 0.78  CALCIUM 9.6 8.4*  MG  --  1.6*  PHOS  --  3.0   GFR: Estimated Creatinine Clearance: 111.5 mL/min (by C-G formula based on SCr of 0.78 mg/dL). Liver Function Tests: Recent Labs  Lab 02/25/18 1344 02/26/18 0500  AST 22 21  ALT 18 15  ALKPHOS 132* 106  BILITOT 0.9 1.0  PROT 7.3 6.1*  ALBUMIN 2.7* 2.1*   No results for input(s): LIPASE, AMYLASE in the last 168  hours. No results for input(s): AMMONIA in the last 168 hours. Coagulation Profile: Recent Labs  Lab 02/25/18 1344  INR 1.10   Cardiac Enzymes: Recent Labs  Lab 02/26/18 0145 02/26/18 0950 02/26/18 1339  TROPONINI <0.03 0.03* 0.03*   BNP (last 3 results) No results for input(s): PROBNP in the last 8760 hours. HbA1C: No results for input(s): HGBA1C in the last 72 hours. CBG: Recent Labs  Lab 02/27/18 0415  GLUCAP 116*   Lipid Profile: No results for input(s): CHOL, HDL, LDLCALC, TRIG, CHOLHDL, LDLDIRECT in the last 72 hours. Thyroid Function Tests: Recent Labs    02/26/18 0500  TSH 1.412   Anemia Panel: No results for input(s): VITAMINB12, FOLATE, FERRITIN, TIBC, IRON, RETICCTPCT in the last 72 hours. Urine analysis:    Component Value Date/Time   COLORURINE YELLOW 02/25/2018 1928   APPEARANCEUR CLEAR 02/25/2018 1928   LABSPEC >1.046 (H) 02/25/2018 1928   PHURINE 5.0 02/25/2018 1928   GLUCOSEU NEGATIVE 02/25/2018 1928   HGBUR NEGATIVE 02/25/2018 1928   HGBUR negative 07/26/2009 [place] 02/25/2018 1928   KETONESUR 20 (A) 02/25/2018 1928   PROTEINUR 30 (A) 02/25/2018 1928  UROBILINOGEN 0.2 08/21/2011 1911   NITRITE NEGATIVE 02/25/2018 1928   LEUKOCYTESUR NEGATIVE 02/25/2018 1928   Sepsis Labs: _0 (procalcitonin:4,lacticidven:4)  ) Recent Results (from the past 240 hour(s))  Culture, blood (Routine x 2)     Status: None (Preliminary result)   Collection Time: 02/25/18  1:45 PM  Result Value Ref Range Status   Specimen Description   Final    BLOOD LEFT FOREARM Performed at [facility] [address], [place], [place] [address]    Special Requests   Final    BOTTLES DRAWN AEROBIC AND ANAEROBIC Blood Culture results may not be optimal due to an excessive volume of blood received in culture bottles Performed at [facility] [address], [place], [place] [address]    Culture   Final     NO GROWTH 2 DAYS Performed at [facility] [address], [place], [place] [address]    Report Status PENDING  Incomplete  Culture, blood (Routine x 2)     Status: None (Preliminary result)   Collection Time: 02/25/18  5:12 PM  Result Value Ref Range Status   Specimen Description   Final    BLOOD LEFT FOREARM Performed at [facility] [address], [place], [place] [address]    Special Requests   Final    BOTTLES DRAWN AEROBIC AND ANAEROBIC Blood Culture adequate volume Performed at [facility] [address] [place], [place] [address]    Culture   Final    NO GROWTH 2 DAYS Performed at [facility] [address], [place], [place] [address]    Report Status PENDING  Incomplete  MRSA PCR Screening     Status: Abnormal   Collection Time: 02/26/18  9:20 AM  Result Value Ref Range Status   MRSA by PCR POSITIVE (A) NEGATIVE Final    Comment:        The GeneXpert MRSA Assay (FDA approved for NASAL specimens only), is one component of a comprehensive MRSA colonization surveillance program. It is not intended to diagnose MRSA infection nor to guide or monitor treatment for MRSA infections. RESULT CALLED TO, READ BACK BY AND VERIFIED WITH: T.DUONG RN 02/26/2018 1208 JR Performed at [facility], [place] [address] [place], [place] [address]       Studies: No results found.  Scheduled Meds: . chlorhexidine gluconate (MEDLINE KIT)  15 mL Mouth Rinse BID  . Chlorhexidine Gluconate Cloth  6 each Topical Q0600  . dexamethasone  4 mg Intravenous Daily  . DULoxetine  60 mg Oral Daily  . feeding supplement  1 Container Oral BID BM  . fondaparinux  7.5 mg Subcutaneous q1800  . gabapentin  400 mg Oral BID  . gi cocktail  30 mL Oral Once  . ipratropium-albuterol  3 mL Nebulization TID  . magic mouthwash  5-10 mL Oral BID  . mouth rinse  15 mL Mouth Rinse 10 times per day  . methadone  20 mg Oral Q8H  .  metoprolol tartrate  2.5 mg Intravenous TID  . multivitamin with minerals  1 tablet Oral Daily  . mupirocin ointment  1 application Nasal BID  . OLANZapine zydis  5 mg Oral QHS  . polyethylene glycol  17 g Oral Daily  . senna-docusate  2 tablet Oral BID  . sucralfate  1 g Oral TID WC & HS  . thiamine injection  100 mg Intravenous Daily  . triamcinolone cream  Topical BID    Continuous Infusions: . sodium chloride Stopped (02/26/18 2307)  . ceFEPime (MAXIPIME) IV Stopped (02/27/18 3559)  . dextrose 5 % and 0.45% NaCl 50 mL/hr at 02/27/18 1106  . famotidine (PEPCID) IV Stopped (02/27/18 1059)  . methocarbamol (ROBAXIN) IV Stopped (02/27/18 1057)  . metronidazole 500 mg (02/27/18 1113)  . posaconazole (NOXAFIL) IV    . vancomycin Stopped (02/26/18 2221)     LOS: 2 days     Kayleen Memos, MD [facility] Pager [phone]  If 7PM-7AM, please contact night-coverage [email or web address] Password [facility] 02/27/2018, 12:48 PM

## 2018-02-27 NOTE — Progress Notes (Signed)
PT Cancellation Note  Patient Details Name: JAAMAL FAROOQUI MRN: [id number] DOB: 1965-09-07   Cancelled Treatment:    Reason Eval/Treat Not Completed: Medical issues which prohibited therapy, to get blood.   Claretha Cooper 02/27/2018, 7:29 AM Index Pager [phone] Office [phone]

## 2018-02-28 ENCOUNTER — Inpatient Hospital Stay: Payer: BLUE CROSS/BLUE SHIELD

## 2018-02-28 DIAGNOSIS — E440 Moderate protein-calorie malnutrition: Secondary | ICD-10-CM

## 2018-02-28 DIAGNOSIS — Z7189 Other specified counseling: Secondary | ICD-10-CM

## 2018-02-28 LAB — TYPE AND SCREEN
ABO/RH(D): O POS
Antibody Screen: NEGATIVE
UNIT DIVISION: 0
Unit division: 0

## 2018-02-28 LAB — BPAM RBC
BLOOD PRODUCT EXPIRATION DATE: 201910162359
Blood Product Expiration Date: 201910162359
ISSUE DATE / TIME: 201909181330
ISSUE DATE / TIME: 201909181758
UNIT TYPE AND RH: 5100
Unit Type and Rh: 5100

## 2018-02-28 LAB — BLOOD GAS, ARTERIAL
ACID-BASE DEFICIT: 2.2 mmol/L — AB (ref 0.0–2.0)
BICARBONATE: 21.2 mmol/L (ref 20.0–28.0)
FIO2: 21
O2 Saturation: 96.2 %
PATIENT TEMPERATURE: 98.6
PH ART: 7.42 (ref 7.350–7.450)
pCO2 arterial: 33.4 mmHg (ref 32.0–48.0)
pO2, Arterial: 83.2 mmHg (ref 83.0–108.0)

## 2018-02-28 LAB — CBC
HEMATOCRIT: 32.2 % — AB (ref 39.0–52.0)
HEMOGLOBIN: 10.2 g/dL — AB (ref 13.0–17.0)
MCH: 27.8 pg (ref 26.0–34.0)
MCHC: 31.7 g/dL (ref 30.0–36.0)
MCV: 87.7 fL (ref 78.0–100.0)
Platelets: 194 10*3/uL (ref 150–400)
RBC: 3.67 MIL/uL — AB (ref 4.22–5.81)
RDW: 17 % — ABNORMAL HIGH (ref 11.5–15.5)
WBC: 8.6 10*3/uL (ref 4.0–10.5)

## 2018-02-28 LAB — MAGNESIUM: MAGNESIUM: 2 mg/dL (ref 1.7–2.4)

## 2018-02-28 MED ORDER — HYDRALAZINE HCL 20 MG/ML IJ SOLN: 5.0000 mg | INTRAMUSCULAR | Status: DC | PRN

## 2018-02-28 MED ORDER — PANTOPRAZOLE SODIUM 40 MG PO TBEC
40.0000 mg | DELAYED_RELEASE_TABLET | Freq: Every day | ORAL | Status: DC
  Administered 2018-02-28 – 2018-03-01 (×2): 40 mg via ORAL
  Filled 2018-02-28 (×2): qty 1

## 2018-02-28 MED ORDER — HALOPERIDOL LACTATE 5 MG/ML IJ SOLN
2.5000 mg | Freq: Once | INTRAMUSCULAR | Status: AC
  Administered 2018-02-28: 2.5 mg via INTRAVENOUS
  Filled 2018-02-28: qty 1

## 2018-02-28 MED ORDER — AMLODIPINE BESYLATE 5 MG PO TABS
5.0000 mg | ORAL_TABLET | Freq: Every day | ORAL | Status: DC
  Administered 2018-02-28 – 2018-03-01 (×2): 5 mg via ORAL
  Filled 2018-02-28 (×2): qty 1

## 2018-02-28 MED ORDER — LORAZEPAM 0.5 MG PO TABS: 0.5000 mg | ORAL_TABLET | Freq: Two times a day (BID) | ORAL | Status: DC | PRN

## 2018-02-28 MED ORDER — LORAZEPAM 1 MG PO TABS
1.0000 mg | ORAL_TABLET | Freq: Every day | ORAL | Status: DC
  Administered 2018-02-28: 1 mg via ORAL
  Filled 2018-02-28: qty 1

## 2018-02-28 MED ORDER — SORBITOL 70 % SOLN: 30.0000 mL | Freq: Once | Status: DC

## 2018-02-28 MED ORDER — LORAZEPAM 2 MG/ML IJ SOLN: 1.0000 mg | Freq: Once | INTRAMUSCULAR | Status: DC

## 2018-02-28 MED ORDER — DEXAMETHASONE 4 MG PO TABS
4.0000 mg | ORAL_TABLET | Freq: Every day | ORAL | Status: DC
  Administered 2018-03-01: 4 mg via ORAL
  Filled 2018-02-28: qty 1

## 2018-02-28 MED ORDER — METHOCARBAMOL 500 MG PO TABS
500.0000 mg | ORAL_TABLET | Freq: Two times a day (BID) | ORAL | Status: DC
  Administered 2018-02-28 – 2018-03-01 (×3): 500 mg via ORAL
  Filled 2018-02-28 (×3): qty 1

## 2018-02-28 NOTE — Progress Notes (Signed)
PROGRESS NOTE  Tony [name] [id number]:[id number] DOB: [birth year] DOA: 02/25/2018 PCP: [name], MD  HPI/Recap of past 24 hours:  Tony [name] is a 52 y.o. male with medical history significant of metastatic adenocarcinoma of the left lung currently sp Chemotherapy finished radiation therapy on Friday, 02/22/2018, PVD, right foot gangrene, chronic back pain, depression GERD, HLD, HTN, SVC syndrome who presented with multiple complaints including fever chills cough productive yellow sputum urinary retention significant constipation and right leg pain. Patient was supposed to get chemotherapy on 11 September but felt to be too ill to have chemo.  Instead he was started doxycycline for gangrenous right foot. Chronic shortness of breath but his cough has been getting worse with increased secretions.  Concern for dehydration.  TRH asked to admit.  02/26/2018: Patient seen and examined at his bedside.  He is alert oriented x3.  He reports moderate to severe pain at his neck, at radiation site.   Patient has multiple wounds including gangrene of right foot.  Wound care specialist consulted and following.  02/27/2018: Patient seen and examined with his wife at bedside.  Reports chest pain since yesterday evening.  Twelve-lead EKG independently reviewed and revealed sinus rhythm with no acute or specific ST-T changes.  Suspect this is related to his post radiation cellulitis.  Also reports persistent odynophagia.  Palliative care team following and managing his symptoms.  02/1918: Patient seen and examined with his wife at bedside.  Per RN and his wife patient was confused and agitated overnight.  This morning patient is alert and oriented x4 and has no recollection of the event.  Suspect ICU delirium.  Patient states his odynophagia is improving on antifungal and pain medications.  Will transfer the patient to [facility] to continue treatment.  ABG done this morning unremarkable for hypoxia.  Last twelve-lead  EKG unremarkable for Signs of acute cardiac ischemia.  Assessment/Plan: Active Problems:   Essential hypertension   Pain of right lower extremity due to ischemia   Hyperlipidemia LDL goal <70   Aortoiliac occlusive disease (HCC)   Gangrene of right foot (HCC)   AKI (acute kidney injury) ([place])   Malignant neoplasm of upper lobe of right lung (HCC)   Mass in neck   SVC syndrome   Dehydration   Sepsis ([place])   Cellulitis   Malignant neoplasm metastatic to lung (HCC)   Malnutrition of moderate degree  Dehydration in the setting of advanced metastatic adenocarcinoma of left lung Continue to monitor electrolytes Continue gentle IV hydration  Sepsis secondary to post radiation cellulitis  Continue broad-spectrum IV antibiotics Sepsis physiology is improving Continue IV antibiotics and antifungal Continue pain management Continue bowel regiment Continue to monitor fever curve Continue to monitor WBC Obtain CBC in the morning  Suspected ICU delirium which has resolved This morning patient is alert oriented x1 and has no recollection of this event Physical exam is nonfocal Transferred to [facility] Continue to monitor Low threshold to obtain CT head with no contrast  Resolving chest pain, suspect secondary to post radiation cellulitis Twelve-lead EKG independently reviewed revealed sinus rhythm with rate of 72 and no acute or specific ST-T changes. Previous troponin less than 0.033  Improving odynophagia on long-term suppressive fluconazole for severe oral pharyngeal/esophageal thrush Improving on antifungal and pain medications Palliative care team managing  Multiple wounds including right foot gangrene Wound care specialist following Continue wound care specialist recommendations  Anemia of chronic disease/symptomatic anemia/iron deficiency anemia Improving post 2 unit PRBC Hemoglobin 10.2 from  7.3 Trans-sat 8 on 12/19/2017 Start ferrous sulfate 325 mg daily  Essential  hypertension Continue home antihypertensive medications Continue to monitor vital signs  Sinus tachycardia Possibly secondary to dehydration Continue to monitor on telemetry  History of DVT Post IVC filter Continue fondaparinux  Metastatic adenocarcinoma of left lung Oncology, with radiation oncology outpatient  Goals of care Palliative care consulted and following to establish goals of care and for symptoms management.  Highly appreciated.  Chronic pain syndrome on methadone Today he is agreeable to take his methadone to avoid withdrawal symptoms  Code Status: Full code  Family Communication: None at bedside  Disposition Plan: Home in 2 to 3 days when clinically improved   Consultants:  Palliative care team  Procedures:  None  Antimicrobials:  IV cefepime and IV vancomycin  DVT prophylaxis: Fondaparinux   Objective: Vitals:   02/28/18 0800 02/28/18 0836 02/28/18 0851 02/28/18 1000  BP: (!) 157/87  (!) 157/87 132/71  Pulse: 78     Resp:    (!) 29  Temp:      TempSrc:      SpO2: 98% 97%    Weight:      Height:        Intake/Output Summary (Last 24 hours) at 02/28/2018 1218 Last data filed at 02/28/2018 0400 Gross per 24 hour  Intake 1795.79 ml  Output 100 ml  Net 1695.79 ml   Filed Weights   02/25/18 1320 02/26/18 1054 02/27/18 0424  Weight: 69.4 kg 70.1 kg 70.6 kg    Exam:  . General: 52 y.o. year-old male well-developed well-nourished in no acute distress.  Alert and oriented x4 . Cardiovascular: Regular rate and rhythm with no rubs or gallops.  No thyromegaly or JVD noted.   Marland Kitchen Respiratory: Clear to auscultation with no wheezes or rales. Good inspiratory effort. . Abdomen: Soft nontender nondistended with normal bowel sounds x4 quadrants. . Skin: Radiation scarring noted at right side of the chest and neck with some cellulitis.  Gangrene affecting the lower extremities. Marland Kitchen Psychiatry: Mood is appropriate for condition and setting   Data  Reviewed: CBC: Recent Labs  Lab 02/25/18 1344 02/26/18 0500 02/27/18 0318 02/28/18 0444  WBC 7.5 6.9 6.9 8.6  NEUTROABS 7.0  --   --   --   HGB 9.6* 7.8* 7.3* 10.2*  HCT 31.1* 24.7* 23.5* 32.2*  MCV 90.9 90.5 89.7 87.7  PLT 228 171 176 371   Basic Metabolic Panel: Recent Labs  Lab 02/25/18 1344 02/26/18 0500 02/28/18 0444  NA 140 136  --   K 4.1 3.9  --   CL 100 103  --   CO2 25 19*  --   GLUCOSE 79 77  --   BUN 28* 17  --   CREATININE 1.00 0.78  --   CALCIUM 9.6 8.4*  --   MG  --  1.6* 2.0  PHOS  --  3.0  --    GFR: Estimated Creatinine Clearance: 111.5 mL/min (by C-G formula based on SCr of 0.78 mg/dL). Liver Function Tests: Recent Labs  Lab 02/25/18 1344 02/26/18 0500  AST 22 21  ALT 18 15  ALKPHOS 132* 106  BILITOT 0.9 1.0  PROT 7.3 6.1*  ALBUMIN 2.7* 2.1*   No results for input(s): LIPASE, AMYLASE in the last 168 hours. No results for input(s): AMMONIA in the last 168 hours. Coagulation Profile: Recent Labs  Lab 02/25/18 1344  INR 1.10   Cardiac Enzymes: Recent Labs  Lab 02/26/18 0145 02/26/18 0950  02/26/18 1339  TROPONINI <0.03 0.03* 0.03*   BNP (last 3 results) No results for input(s): PROBNP in the last 8760 hours. HbA1C: No results for input(s): HGBA1C in the last 72 hours. CBG: Recent Labs  Lab 02/27/18 0415  GLUCAP 116*   Lipid Profile: No results for input(s): CHOL, HDL, LDLCALC, TRIG, CHOLHDL, LDLDIRECT in the last 72 hours. Thyroid Function Tests: Recent Labs    02/26/18 0500  TSH 1.412   Anemia Panel: No results for input(s): VITAMINB12, FOLATE, FERRITIN, TIBC, IRON, RETICCTPCT in the last 72 hours. Urine analysis:    Component Value Date/Time   COLORURINE YELLOW 02/25/2018 1928   APPEARANCEUR CLEAR 02/25/2018 1928   LABSPEC >1.046 (H) 02/25/2018 1928   PHURINE 5.0 02/25/2018 1928   GLUCOSEU NEGATIVE 02/25/2018 1928   HGBUR NEGATIVE 02/25/2018 1928   HGBUR negative 07/26/2009 1050   BILIRUBINUR NEGATIVE  02/25/2018 1928   KETONESUR 20 (A) 02/25/2018 1928   PROTEINUR 30 (A) 02/25/2018 1928   UROBILINOGEN 0.2 08/21/2011 1911   NITRITE NEGATIVE 02/25/2018 1928   LEUKOCYTESUR NEGATIVE 02/25/2018 1928   Sepsis Labs: _0 (procalcitonin:4,lacticidven:4)  ) Recent Results (from the past 240 hour(s))  Culture, blood (Routine x 2)     Status: None (Preliminary result)   Collection Time: 02/25/18  1:45 PM  Result Value Ref Range Status   Specimen Description   Final    BLOOD LEFT FOREARM Performed at [facility], [place] [address] [place], [place] [address]    Special Requests   Final    BOTTLES DRAWN AEROBIC AND ANAEROBIC Blood Culture results may not be optimal due to an excessive volume of blood received in culture bottles Performed at [facility] [address], [place], [place] [address]    Culture   Final    NO GROWTH 3 DAYS Performed at [facility], [place] [address] [place], [place] [address]    Report Status PENDING  Incomplete  Culture, blood (Routine x 2)     Status: None (Preliminary result)   Collection Time: 02/25/18  5:12 PM  Result Value Ref Range Status   Specimen Description   Final    BLOOD LEFT FOREARM Performed at [facility] [address], [place], [place] [address]    Special Requests   Final    BOTTLES DRAWN AEROBIC AND ANAEROBIC Blood Culture adequate volume Performed at [facility] [address] [place], [place] [address]    Culture   Final    NO GROWTH 3 DAYS Performed at [facility], [place] [address], [place], [place] [address]    Report Status PENDING  Incomplete  MRSA PCR Screening     Status: Abnormal   Collection Time: 02/26/18  9:20 AM  Result Value Ref Range Status   MRSA by PCR POSITIVE (A) NEGATIVE Final    Comment:        The GeneXpert MRSA Assay (FDA approved for NASAL specimens only), is one component of a comprehensive MRSA  colonization surveillance program. It is not intended to diagnose MRSA infection nor to guide or monitor treatment for MRSA infections. RESULT CALLED TO, READ BACK BY AND VERIFIED WITH: T.DUONG RN 02/26/2018 1208 JR Performed at [facility] [place] [address], [place], [place] [address]       Studies: No results found.  Scheduled Meds: . amLODipine  5 mg Oral Daily  . chlorhexidine gluconate (MEDLINE KIT)  15 mL Mouth Rinse BID  .  Chlorhexidine Gluconate Cloth  6 each Topical Q0600  . [START ON 03/01/2018] dexamethasone  4 mg Oral Daily  . DULoxetine  60 mg Oral Daily  . feeding supplement  1 Container Oral BID BM  . fondaparinux  7.5 mg Subcutaneous q1800  . gabapentin  400 mg Oral BID  . gi cocktail  30 mL Oral Once  . ipratropium-albuterol  3 mL Nebulization TID  . LORazepam  1 mg Oral QHS  . magic mouthwash  5-10 mL Oral BID  . mouth rinse  15 mL Mouth Rinse 10 times per day  . methadone  20 mg Oral Q8H  . methocarbamol  500 mg Oral BID  . metoprolol tartrate  2.5 mg Intravenous TID  . multivitamin with minerals  1 tablet Oral Daily  . mupirocin ointment  1 application Nasal BID  . OLANZapine zydis  5 mg Oral QHS  . pantoprazole  40 mg Oral Daily  . polyethylene glycol  17 g Oral Daily  . senna-docusate  2 tablet Oral BID  . sorbitol  30 mL Oral Once  . thiamine injection  100 mg Intravenous Daily  . triamcinolone cream   Topical BID    Continuous Infusions: . sodium chloride 250 mL (02/28/18 1030)  . ceFEPime (MAXIPIME) IV Stopped (02/28/18 0727)  . dextrose 5 % and 0.45% NaCl 50 mL/hr at 02/27/18 1644  . fluconazole (DIFLUCAN) IV 400 mg (02/28/18 1030)  . metronidazole Stopped (02/28/18 1103)  . vancomycin Stopped (02/28/18 0130)     LOS: 3 days     Kayleen Memos, MD [facility] Pager [phone]  If 7PM-7AM, please contact night-coverage [email or web address] Password [facility] 02/28/2018, 12:18 PM

## 2018-02-28 NOTE — Progress Notes (Signed)
Pt has become increasingly restless throughout the night.  At the beginning of the night around 1900, pt able to carry on conversations but would go off on tangents not related to conversation.  Then he started to take monitor leads, BP cuff, and pulse ox off.  Every time pt  would do this equipment would be placed back on with instruction and education as to why equipment needed to stay on.  This now has excalated to pt now repeatedly attempting to get out of bed, family member at bedside attempting to assist with keeping in bed.  PRN pain medication has been given along with Ativan.  I have paged Chaney Malling, NP at [phone], to assist in next steps to manage patient, no response as of yet (currently [phone]) will repage.  Felecia Jan ICU/SD Care Coordinator / Rapid Response Nurse Rapid Response Number:  [phone]

## 2018-02-28 NOTE — Progress Notes (Signed)
Daily Progress Note   Patient Name: Tony [name]       Date: 02/28/2018 DOB: 06/21/1965  Age: 52 y.o. MRN#: [id number] Attending Physician: Kayleen Memos, DO Primary Care Physician: [name], MD Admit Date: 02/25/2018  Reason for Consultation/Follow-up: Establishing goals of care, Pain control and Psychosocial/spiritual support  Subjective: Tony [name] is sitting on side of bed eating grits this morning. Says he feels better.   Length of Stay: 3  Current Medications: Scheduled Meds:  . amLODipine  5 mg Oral Daily  . chlorhexidine gluconate (MEDLINE KIT)  15 mL Mouth Rinse BID  . Chlorhexidine Gluconate Cloth  6 each Topical Q0600  . [START ON 03/01/2018] dexamethasone  4 mg Oral Daily  . DULoxetine  60 mg Oral Daily  . feeding supplement  1 Container Oral BID BM  . fondaparinux  7.5 mg Subcutaneous q1800  . gabapentin  400 mg Oral BID  . gi cocktail  30 mL Oral Once  . ipratropium-albuterol  3 mL Nebulization TID  . LORazepam  1 mg Oral QHS  . magic mouthwash  5-10 mL Oral BID  . mouth rinse  15 mL Mouth Rinse 10 times per day  . methadone  20 mg Oral Q8H  . methocarbamol  500 mg Oral BID  . metoprolol tartrate  2.5 mg Intravenous TID  . multivitamin with minerals  1 tablet Oral Daily  . mupirocin ointment  1 application Nasal BID  . OLANZapine zydis  5 mg Oral QHS  . pantoprazole  40 mg Oral Daily  . polyethylene glycol  17 g Oral Daily  . senna-docusate  2 tablet Oral BID  . thiamine injection  100 mg Intravenous Daily  . triamcinolone cream   Topical BID    Continuous Infusions: . sodium chloride Stopped (02/26/18 2307)  . ceFEPime (MAXIPIME) IV Stopped (02/28/18 0727)  . dextrose 5 % and 0.45% NaCl 50 mL/hr at 02/27/18 1644  . fluconazole (DIFLUCAN) IV Stopped  (02/27/18 1629)  . metronidazole 500 mg (02/28/18 1003)  . vancomycin Stopped (02/28/18 0130)    PRN Meds: sodium chloride, acetaminophen **OR** acetaminophen, albuterol, bisacodyl, hydrALAZINE, HYDROmorphone (DILAUDID) injection, LORazepam, ondansetron **OR** ondansetron (ZOFRAN) IV  Physical Exam         Constitutional: He is oriented to person, place, and time. He appears well-developed.  HENT:  Head: Normocephalic and atraumatic.  Neck: Muscular tenderness present. Edema and erythema present although they seem improved from yesterday - especially the edema.  Skin breakdown s/t radiation burns. R neck wound drsg changed with slight purulent drainage.   Cardiovascular: Tachycardia improved.  Pulmonary/Chest: Effort normal. No accessory muscle usage. No tachypnea. No respiratory distress.  Secretions improved.   Abdominal: Normal appearance.  Neurological: He is alert and oriented to person, place, and time.  Nursing note and vitals reviewed.  Vital Signs: BP (!) 157/87   Pulse 79   Temp 97.6 F (36.4 C) (Oral)   Resp 15   Ht 6' (1.829 m)   Wt 70.6 kg   SpO2 97%   BMI 21.11 kg/m  SpO2: SpO2: 97 % O2 Device: O2 Device: Room Air O2 Flow Rate:    Intake/output summary:   Intake/Output Summary (Last 24 hours) at 02/28/2018 1026 Last data filed at 02/28/2018 0400 Gross per 24 hour  Intake 2249.86 ml  Output 100 ml  Net 2149.86 ml   LBM: Last BM Date: 02/25/18 Baseline Weight: Weight: 69.4 kg Most recent weight: Weight: 70.6 kg       Palliative Assessment/Data: 50%      Patient Active Problem List   Diagnosis Date Noted  . Malnutrition of moderate degree 02/28/2018  . Cellulitis 02/26/2018  . Malignant neoplasm metastatic to lung ([place])   . Dehydration 02/25/2018  . Sepsis ([place]) 02/25/2018  . Thrush of mouth and esophagus ([place]) 01/16/2018  . Counseling regarding advance care planning and goals of care 01/01/2018  . Adenocarcinoma ([place])   . SVC syndrome  12/31/2017  . Adenocarcinoma of left lung, stage 4 ([place]) 12/31/2017  . Mass in neck   . Neck pain due to malignant neoplastic disease ([place])   . Palliative care by specialist   . Uncontrolled pain   . Palliative care encounter   . Malignant neoplasm of upper lobe of right lung ([place]) 12/25/2017  . Gangrene of right foot ([place]) 12/20/2017  . AKI (acute kidney injury) ([place]) 12/20/2017  . PAD (peripheral artery disease) ([place]) 12/19/2017  . S/P aortobifemoral bypass surgery 12/17/2017  . Lung mass 12/17/2017  . Postoperative wound infection 12/17/2017  . Weight loss 12/17/2017  . Cervical lymphadenopathy 12/17/2017  . DVT (deep venous thrombosis) ([place]) 12/17/2017  . Aortoiliac occlusive disease ([place]) 11/15/2017  . Pain of right lower extremity due to ischemia 11/13/2017  . Hyperlipidemia LDL goal <70 11/13/2017  . PVD (peripheral vascular disease) ([place]) 11/11/2017  . H. pylori infection 10/11/2011  . Duodenal ulcer 08/23/2011  . IBS (irritable bowel syndrome) 08/18/2011  . Depression with anxiety 05/11/2009  . EPIGASTRIC PAIN, CHRONIC 05/11/2009  . SMOKER 09/01/2008  . INSOMNIA, CHRONIC 09/01/2008  . Essential hypertension 09/01/2008  . PALPITATIONS, CHRONIC 09/01/2008  . GASTRITIS 10/24/2007    Palliative Care Assessment & Plan   HPI: 52 y.o. male  with past medical history of HTN, HLD, arterial occlusive disease, PVD, IBS, chronic back pain, anxiety, depressionadmitted on 7/8/2019with increased drainage from incision sites and with critical limb ischemia of left lower extremity.S/P Right Aortobifemoral bypass and fem-pop bypass 6/6 and found to thrombectomy with fem-pop bypass on left 6/22. Recent prolonged hospitalization complicated by uncontrolled pain likely s/t newly diagnosed metastatic adenocarcinoma with significant lymphadenopathy to neck and SVC syndrome. Also with venous thrombus of right subclavian, brachiocephalic, and IJ veins. He just completed his course of radiation  therapy Friday 02/22/18 and received one dose of chemotherapy 02/13/2018 Admitted on 02/25/2018  with fever, cough, leg pain and admitted with dehydration and likely cellulitis to right neck wounds. R foot gangrene actually appears improved to me from when I saw him last 01/10/18 and does not appear to be infected. He also has decreased appetite with severe oral candida likely into esophagus given his reports of painful swallowing.   Assessment: I met today with Nishaan and wife, Glenard Haring, at bedside. He reports that he feels much better this morning and is eating. He agrees to transition to po meds. He does not remember his confusion last night but says he knows that everyone is not lying to him. Unclear the reason for his confusion (he did not receive ativan that he has been taking at home at night??). He is completely clear this morning. He reports that pain has been well controlled. Consider changing back to po dilaudid prn tomorrow from IV.   I did speak with [facility] privately as she was leaving to go home and rest (she was up all night with E[facility]). We spoke about his confusion last night and she admitted that she was scared he was "transitioning." She shares that she knows about with his stage of cancer that this is can happen. I explained that he is very sick and that she is correct to be thinking as he could approach EOL at any time given his tenuous state. She appreciated knowing this. I inquired if they have spoken about his wishes and she shares that he has told her he wants to die at home, "he doesn't want to go to hospice." She also says that he wants to be resuscitated. I encouraged them to continue considering these options. She gave me a hug and left for home.   Kenzel and Glenard Haring talk about [facility] and all the support they have received from palliative care inpatient and outpatient. I believe they would benefit from further [place] conversations at home Gilmer becomes very nervous in the  hospital).   Recommendations/Plan:  LBM 9/19 Continue senokot.   Ativan changed to 1 mg qhs po (as he was taking at home per wife).   IProtonix po PPI prophylaxis.   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 Continue IV fluconazole.  ? Continue Magic mouthwash BID. ? Sucralfate suspension d/c with throat pain improvement and transition back to po meds.   Pain s/t cancer and radiation burns:  ? Continue methadone 20 mg q8h.  ? IV Dilaudid 1 mg q2h prn.  ? Robaxin changed to 500 mg po BID ? Decadron 4 mg po daily.   Goals of Care and Additional Recommendations:  Limitations on Scope of Treatment: Full Scope Treatment  Code Status:  Full code  Prognosis:   Overall prognosis is extremely poor with very advanced mets lung cancer.  Discharge Planning:  To Be Determined  Thank you for allowing the Palliative Medicine Team to assist in the care of this patient.   Total Time 35 min Prolonged Time Billed  no       Greater than 50%  of this time was spent counseling and coordinating care related to the above assessment and plan.  Vinie Sill, NP Palliative Medicine Team Pager # [phone] (M-F 8a-5p) Team Phone # [phone] (Nights/Weekends)

## 2018-02-28 NOTE — Progress Notes (Signed)
PT Cancellation Note  Patient Details Name: Tony [name] MRN: [id number] DOB: August 21, 1965   Cancelled Treatment:    Reason Eval/Treat Not Completed: Fatigue/lethargy limiting ability to participate RN reports rough night and pt finally sleeping.  Will check back as schedule permits.   Terius Jacuinde,KATHrine E 02/28/2018, 11:22 AM Carmelia Bake, PT, DPT Acute Rehabilitation Services Office: [phone] Pager: [phone]

## 2018-02-28 NOTE — Progress Notes (Signed)
OT Cancellation Note  Patient Details Name: Tony [name] MRN: [id number] DOB: Aug 12, 1965   Cancelled Treatment:    Reason Eval/Treat Not Completed: Other (comment). Pt had a rough night and was sleeping. Will check on him tomorrow.  Merci Walthers 02/28/2018, 11:20 AM  Lesle Chris, OTR/L Acute Rehabilitation Services [phone] [facility] pager [phone] office 02/28/2018

## 2018-03-01 ENCOUNTER — Encounter: Payer: Self-pay | Admitting: Radiation Oncology

## 2018-03-01 DIAGNOSIS — L03221 Cellulitis of neck: Secondary | ICD-10-CM

## 2018-03-01 DIAGNOSIS — I96 Gangrene, not elsewhere classified: Secondary | ICD-10-CM

## 2018-03-01 DIAGNOSIS — C7800 Secondary malignant neoplasm of unspecified lung: Secondary | ICD-10-CM

## 2018-03-01 DIAGNOSIS — C3411 Malignant neoplasm of upper lobe, right bronchus or lung: Secondary | ICD-10-CM

## 2018-03-01 DIAGNOSIS — E785 Hyperlipidemia, unspecified: Secondary | ICD-10-CM

## 2018-03-01 LAB — CBC
HCT: 32.7 % — ABNORMAL LOW (ref 39.0–52.0)
Hemoglobin: 10.3 g/dL — ABNORMAL LOW (ref 13.0–17.0)
MCH: 27.8 pg (ref 26.0–34.0)
MCHC: 31.5 g/dL (ref 30.0–36.0)
MCV: 88.1 fL (ref 78.0–100.0)
PLATELETS: 213 10*3/uL (ref 150–400)
RBC: 3.71 MIL/uL — AB (ref 4.22–5.81)
RDW: 17.3 % — ABNORMAL HIGH (ref 11.5–15.5)
WBC: 9.1 10*3/uL (ref 4.0–10.5)

## 2018-03-01 LAB — BASIC METABOLIC PANEL
Anion gap: 9 (ref 5–15)
BUN: 19 mg/dL (ref 6–20)
CO2: 22 mmol/L (ref 22–32)
Calcium: 8.8 mg/dL — ABNORMAL LOW (ref 8.9–10.3)
Chloride: 110 mmol/L (ref 98–111)
Creatinine, Ser: 0.69 mg/dL (ref 0.61–1.24)
Glucose, Bld: 119 mg/dL — ABNORMAL HIGH (ref 70–99)
POTASSIUM: 3.9 mmol/L (ref 3.5–5.1)
SODIUM: 141 mmol/L (ref 135–145)

## 2018-03-01 MED ORDER — AMLODIPINE BESYLATE 5 MG PO TABS: 5.0000 mg | ORAL_TABLET | Freq: Every day | ORAL | 0 refills | Status: AC

## 2018-03-01 MED ORDER — CHLORHEXIDINE GLUCONATE 0.12% ORAL RINSE (MEDLINE KIT): 15.0000 mL | Freq: Two times a day (BID) | OROMUCOSAL | 0 refills | Status: DC

## 2018-03-01 MED ORDER — ADULT MULTIVITAMIN W/MINERALS CH: 1.0000 | ORAL_TABLET | Freq: Every day | ORAL | 0 refills | Status: DC

## 2018-03-01 MED ORDER — DRONABINOL 2.5 MG PO CAPS: 2.5000 mg | ORAL_CAPSULE | Freq: Two times a day (BID) | ORAL | Status: DC

## 2018-03-01 MED ORDER — METHOCARBAMOL 500 MG PO TABS: 500.0000 mg | ORAL_TABLET | Freq: Two times a day (BID) | ORAL | 0 refills | Status: AC

## 2018-03-01 MED ORDER — HYDROMORPHONE HCL 4 MG PO TABS: 8.0000 mg | ORAL_TABLET | ORAL | Status: DC | PRN

## 2018-03-01 MED ORDER — FLUCONAZOLE 100 MG PO TABS: 100.0000 mg | ORAL_TABLET | Freq: Every day | ORAL | 0 refills | Status: AC

## 2018-03-01 MED ORDER — HYDROMORPHONE HCL 4 MG PO TABS: 16.0000 mg | ORAL_TABLET | ORAL | Status: DC | PRN

## 2018-03-01 MED ORDER — SULFAMETHOXAZOLE-TRIMETHOPRIM 800-160 MG PO TABS
1.0000 | ORAL_TABLET | Freq: Every day | ORAL | Status: DC
  Administered 2018-03-01: 1 via ORAL
  Filled 2018-03-01: qty 1

## 2018-03-01 MED ORDER — FERROUS SULFATE 325 (65 FE) MG PO TABS
325.0000 mg | ORAL_TABLET | Freq: Every day | ORAL | Status: DC
  Administered 2018-03-01: 325 mg via ORAL
  Filled 2018-03-01: qty 1

## 2018-03-01 MED ORDER — FERROUS SULFATE 325 (65 FE) MG PO TABS: 325.0000 mg | ORAL_TABLET | Freq: Every day | ORAL | 0 refills | Status: AC

## 2018-03-01 MED ORDER — SULFAMETHOXAZOLE-TRIMETHOPRIM 800-160 MG PO TABS: 1.0000 | ORAL_TABLET | Freq: Every day | ORAL | 0 refills | Status: AC

## 2018-03-01 MED ORDER — ADULT MULTIVITAMIN W/MINERALS CH: 1.0000 | ORAL_TABLET | Freq: Every day | ORAL | 0 refills | Status: AC

## 2018-03-01 MED ORDER — CHLORHEXIDINE GLUCONATE 0.12% ORAL RINSE (MEDLINE KIT): 15.0000 mL | Freq: Two times a day (BID) | OROMUCOSAL | 0 refills | Status: AC

## 2018-03-01 MED ORDER — TRIAMCINOLONE ACETONIDE 0.1 % EX CREA: TOPICAL_CREAM | Freq: Two times a day (BID) | CUTANEOUS | 0 refills | Status: AC

## 2018-03-01 MED ORDER — ALBUTEROL SULFATE (2.5 MG/3ML) 0.083% IN NEBU: 2.5000 mg | INHALATION_SOLUTION | RESPIRATORY_TRACT | 0 refills | Status: AC | PRN

## 2018-03-01 MED ORDER — HYDROMORPHONE HCL 8 MG PO TABS: 8.0000 mg | ORAL_TABLET | ORAL | 0 refills | Status: AC | PRN

## 2018-03-01 MED ORDER — DRONABINOL 2.5 MG PO CAPS
2.5000 mg | ORAL_CAPSULE | Freq: Two times a day (BID) | ORAL | Status: DC
  Administered 2018-03-01: 2.5 mg via ORAL
  Filled 2018-03-01: qty 1

## 2018-03-01 NOTE — Progress Notes (Signed)
OT Cancellation Note  Patient Details Name: Tony [name] MRN: [id number] DOB: December 23, 1965   Cancelled Treatment:    Reason Eval/Treat Not Completed: OT screened, no needs identified, will sign off.  Checked in with pt.  He is doing much better and doesn't need OT in acute setting. Will sign off  Delmy Holdren 03/01/2018, 11:27 AM  Lesle Chris, OTR/L Acute Rehabilitation Services [phone] [facility] pager [phone] office 03/01/2018

## 2018-03-01 NOTE — Progress Notes (Signed)
  Radiation Oncology         [phone] ________________________________  Name: Tony [name] MRN: [id number]  Date: 03/01/2018  DOB: [birth year]  End of Treatment Note  Diagnosis:   52 yo man with adenocarcinoma of the right upper lung with SVC syndrome     Indication for treatment:  Curative       Radiation treatment dates:   12/31/17 - 02/22/18  Site/dose:   There were several adjustments during the course of radiation to optimize coverage of enlarging nodes in the neck at the upper edge of the field.  A total of 37 treatments were delivered with a total nominal dose of 84 Gy, although the total to the chest region was approximately 66 Gy  Beams/energy:   3D radiation planning with 6 MV X-rays  Narrative: The patient tolerated radiation treatment relatively well.   He had esophagitis, neck swelling and radiation dermatitis.  Plan: The patient has completed radiation treatment. The patient will return to radiation oncology clinic for routine followup in one month. I advised him to call or return sooner if he has any questions or concerns related to his recovery or treatment. ________________________________  Sheral Apley. Tammi Klippel, M.D.  This document serves as a record of services personally performed by Tyler Pita, MD. It was created on his behalf by Wilburn Mylar, a trained medical scribe. The creation of this record is based on the scribe's personal observations and the provider's statements to them. This document has been checked and approved by the attending provider.

## 2018-03-01 NOTE — Progress Notes (Signed)
Pharmacy Antibiotic Note  Tony [name] is a 52 y.o. male admitted on 02/25/2018 with sepsis.  Pt initially started on Vancomycin and cefepime.  Vanc d/c'd on 9/19.   03/01/2018  WBC 9.1 Scr 0.69, CrCl> 174mls/min afebrile BCx : ngtd  Plan: Continue Cefepime 1gm iv q8hr Consider de-escalation if clinically appropriate Fluconazole 400mg  IV Q24h (per MD) Monitor renal function, cultures, and clinical course   Height: 6' (182.9 cm) Weight: 158 lb 8.2 oz (71.9 kg) IBW/kg (Calculated) : 77.6  Temp (24hrs), Avg:98.1 F (36.7 C), Min:97.9 F (36.6 C), Max:98.3 F (36.8 C)  Recent Labs  Lab 02/25/18 1344 02/25/18 1354 02/25/18 1721 02/25/18 2316 02/26/18 0500 02/27/18 0318 02/28/18 0444 03/01/18 0427  WBC 7.5  --   --   --  6.9 6.9 8.6 9.1  CREATININE 1.00  --   --   --  0.78  --   --  0.69  LATICACIDVEN  --  1.47 1.15 0.8  --   --   --   --     Estimated Creatinine Clearance: 113.6 mL/min (by C-G formula based on SCr of 0.69 mg/dL).    Allergies  Allergen Reactions  . Pork-Derived Products Other (See Comments)    UNSPECIFIED REASON - Pt doesn't eat pork  . Shellfish-Derived Products Other (See Comments)    Doesn't want to eat  . Morphine And Related Other (See Comments)    Hallucinations    Antimicrobials this admission: Vancomycin 02/26/2018 >> 9/19 Cefepime 02/26/2018 >>  Fluconazole 9/17 >> Dose adjustments this admission:   Microbiology results: 9/16 BCs: ngtd 9/17 MRSA PCR: positivie  Thank you for allowing pharmacy to be a part of this patient's care.  Dolly Rias RPh 03/01/2018, 10:03 AM Pager [phone]

## 2018-03-01 NOTE — Progress Notes (Signed)
PT Cancellation Note  Patient Details Name: Tony [name] MRN: [id number] DOB: 1965/07/30   Cancelled Treatment:    Reason Eval/Treat Not Completed: PT screened, no needs identified, will sign off(pt reports IND amb in hallway and room)   [facility] 03/01/2018, 12:51 PM

## 2018-03-01 NOTE — Discharge Instructions (Signed)
Dehydration, Adult Dehydration is when there is not enough fluid or water in your body. This happens when you lose more fluids than you take in. Dehydration can range from mild to very bad. It should be treated right away to keep it from getting very bad. Symptoms of mild dehydration may include:  Thirst.  Dry lips.  Slightly dry mouth.  Dry, warm skin.  Dizziness. Symptoms of moderate dehydration may include:  Very dry mouth.  Muscle cramps.  Dark pee (urine). Pee may be the color of tea.  Your body making less pee.  Your eyes making fewer tears.  Heartbeat that is uneven or faster than normal (palpitations).  Headache.  Light-headedness, especially when you stand up from sitting.  Fainting (syncope). Symptoms of very bad dehydration may include:  Changes in skin, such as: ? Cold and clammy skin. ? Blotchy (mottled) or pale skin. ? Skin that does not quickly return to normal after being lightly pinched and let go (poor skin turgor).  Changes in body fluids, such as: ? Feeling very thirsty. ? Your eyes making fewer tears. ? Not sweating when body temperature is high, such as in hot weather. ? Your body making very little pee.  Changes in vital signs, such as: ? Weak pulse. ? Pulse that is more than 100 beats a minute when you are sitting still. ? Fast breathing. ? Low blood pressure.  Other changes, such as: ? Sunken eyes. ? Cold hands and feet. ? Confusion. ? Lack of energy (lethargy). ? Trouble waking up from sleep. ? Short-term weight loss. ? Unconsciousness. Follow these instructions at home:  If told by your doctor, drink an ORS: ? Make an ORS by using instructions on the package. ? Start by drinking small amounts, about  cup (120 mL) every 5-10 minutes. ? Slowly drink more until you have had the amount that your doctor said to have.  Drink enough clear fluid to keep your pee clear or pale yellow. If you were told to drink an ORS, finish the ORS  first, then start slowly drinking clear fluids. Drink fluids such as: ? Water. Do not drink only water by itself. Doing that can make the salt (sodium) level in your body get too low (hyponatremia). ? Ice chips. ? Fruit juice that you have added water to (diluted). ? Low-calorie sports drinks.  Avoid: ? Alcohol. ? Drinks that have a lot of sugar. These include high-calorie sports drinks, fruit juice that does not have water added, and soda. ? Caffeine. ? Foods that are greasy or have a lot of fat or sugar.  Take over-the-counter and prescription medicines only as told by your doctor.  Do not take salt tablets. Doing that can make the salt level in your body get too high (hypernatremia).  Eat foods that have minerals (electrolytes). Examples include bananas, oranges, potatoes, tomatoes, and spinach.  Keep all follow-up visits as told by your doctor. This is important. Contact a doctor if:  You have belly (abdominal) pain that: ? Gets worse. ? Stays in one area (localizes).  You have a rash.  You have a stiff neck.  You get angry or annoyed more easily than normal (irritability).  You are more sleepy than normal.  You have a harder time waking up than normal.  You feel: ? Weak. ? Dizzy. ? Very thirsty.  You have peed (urinated) only a small amount of very dark pee during 6-8 hours. Get help right away if:  You have symptoms of  very bad dehydration.  You cannot drink fluids without throwing up (vomiting).  Your symptoms get worse with treatment.  You have a fever.  You have a very bad headache.  You are throwing up or having watery poop (diarrhea) and it: ? Gets worse. ? Does not go away.  You have blood or something green (bile) in your throw-up.  You have blood in your poop (stool). This may cause poop to look black and tarry.  You have not peed in 6-8 hours.  You pass out (faint).  Your heart rate when you are sitting still is more than 100 beats a  minute.  You have trouble breathing. This information is not intended to replace advice given to you by your health care provider. Make sure you discuss any questions you have with your health care provider. Document Released: 03/25/2009 Document Revised: 12/17/2015 Document Reviewed: 07/23/2015 Elsevier Interactive Patient Education  2018 [place] With Chronic Cancer Chronic cancer refers to cancers that are not cured with treatment, but can be controlled for months or years with treatment. When undergoing long-term cancer treatment, ask for support and resources from your cancer care team and caregivers. They can help you manage any pain as well as other side effects of treatment so that you can have a good quality of life. How is chronic cancer treated? Over the long term, chronic cancers are usually managed with chemotherapy. Maintenance therapy is when chemotherapy is given on a regular schedule to keep the cancer under control. Chemotherapy may also be given only when the cancer is active. When cancer is inactive, it is watched closely through imaging tests and blood tests. When it becomes active again, treatment resumes. When a cancer is not curable, treatment goals may be to:  Keep the cancer from growing or progressing for as long as possible.  Improve your symptoms related to the cancer, such as pain.  What are common side effects of treatment?  Fatigue.  Increased risk of infections, bruising, or bleeding.  Nausea and vomiting.  Constipation or diarrhea.  Appetite loss.  Hair and skin changes.  Mouth or throat sores.  Tingling, pain, or numbness in the hands and feet.  Confusion, anxiety, or mood swings. What are some ways to manage side effects?  Meet with a nutrition specialist (dietitian) to talk about what you should eat and drink during treatment. ? Eat small meals and snacks throughout the day. ? Eat foods that are high in calories and protein  to give you energy. ? Eat foods that taste good to you and are easy to digest. ? When you are unable to eat, use liquid drinks that provide a balance of calories and nutrition.  Drink enough fluid to keep your urine clear or pale yellow.  Use gentle creams and lotions to keep your skin moist.  Limit naps to short periods throughout the day.  Try to get in regular gentle exercise each day.  Take over-the-counter and prescription medicines only as told by your health care provider. What are some ways to manage emotions?  Meet with a counselor or spiritual leader to talk through your feelings.  Write down your thoughts and feelings in a journal.  Find ways to reduce anxiety, such as meditation, guided imagery, yoga, or tai chi. What are some tips for getting help and support from others?  Consider asking a friend or caregiver to help with errands such as grocery shopping, cleaning, and driving you to your appointments.  Contact your local home care agencies, community agencies, and social agencies for help.  Ask to meet with a palliative care specialist for help treating your pain and other symptoms. Palliative care can help you manage symptoms, promote comfort, improve quality of life, and maintain dignity. Palliative care may be offered during any phase of a cancer diagnosis. What questions should I ask my cancer care team?  What is my estimated survival?  What are signs of cancer progression?  What symptoms should I expect?  How can I manage symptoms and side effects of treatment?  Is palliative care an option for me? Where to find more information:  American Cancer Society: www.cancer.[place]: www.cancer.gov Get help right away if:  You have a fever.  You have a seizure.  You have a headache that does not go away.  You develop any new symptoms.  You have pain that is not controlled well with medicine. Summary  Chronic cancer refers to  cancers that are not cured with treatment, but can be controlled for months or years with treatment.  Over the long term, chronic cancers are usually managed with chemotherapy.  When undergoing long-term cancer treatment, ask for support and resources from your cancer care team and caregivers.  Ask to meet with a palliative care specialist for help treating your pain and other symptoms. Palliative care may be offered during any phase of a cancer diagnosis. This information is not intended to replace advice given to you by your health care provider. Make sure you discuss any questions you have with your health care provider. Document Released: 04/19/2016 Document Revised: 07/28/2016 Document Reviewed: 04/19/2016 Elsevier Interactive Patient Education  2018 [employer].   Rehydration, Adult Rehydration is the replacement of body fluids and salts and minerals (electrolytes) that are lost during dehydration. Dehydration is when there is not enough fluid or water in the body. This happens when you lose more fluids than you take in. Common causes of dehydration include:  Vomiting.  Diarrhea.  Excessive sweating, such as from heat exposure or exercise.  Taking medicines that cause the body to lose excess fluid (diuretics).  Impaired kidney function.  Not drinking enough fluid.  Certain illnesses or infections.  Certain poorly controlled long-term (chronic) illnesses, such as diabetes, heart disease, and kidney disease.  Symptoms of mild dehydration may include thirst, dry lips and mouth, dry skin, and dizziness. Symptoms of severe dehydration may include increased heart rate, confusion, fainting, and not urinating. You can rehydrate by drinking certain fluids or getting fluids through an IV tube, as told by your health care provider. What are the risks? Generally, rehydration is safe. However, one problem that can happen is taking in too much fluid (overhydration). This is rare. If  overhydration happens, it can cause an electrolyte imbalance, kidney failure, or a decrease in salt (sodium) levels in the body. How to rehydrate Follow instructions from your health care provider for rehydration. The kind of fluid you should drink and the amount you should drink depend on your condition.  If directed by your health care provider, drink an oral rehydration solution (ORS). This is a drink designed to treat dehydration that is found in pharmacies and retail stores. ? Make an ORS by following instructions on the package. ? Start by drinking small amounts, about  cup (120 mL) every 5-10 minutes. ? Slowly increase how much you drink until you have taken the amount recommended by your health care provider.  Drink enough clear fluids  to keep your urine clear or pale yellow. If you were instructed to drink an ORS, finish the ORS first, then start slowly drinking other clear fluids. Drink fluids such as: ? Water. Do not drink only water. Doing that can lead to having too little sodium in your body (hyponatremia). ? Ice chips. ? Fruit juice that you have added water to (diluted juice). ? Low-calorie sports drinks.  If you are severely dehydrated, your health care provider may recommend that you receive fluids through an IV tube in the hospital.  Do not take sodium tablets. Doing that can lead to the condition of having too much sodium in your body (hypernatremia).  Eating while you rehydrate Follow instructions from your health care provider about what to eat while you rehydrate. Your health care provider may recommend that you slowly begin eating regular foods in small amounts.  Eat foods that contain a healthy balance of electrolytes, such as bananas, oranges, potatoes, tomatoes, and spinach.  Avoid foods that are greasy or contain a lot of fat or sugar.  In some cases, you may get nutrition through a feeding tube that is passed through your nose and into your stomach (nasogastric  tube, or NG tube). This may be done if you have uncontrolled vomiting or diarrhea. Beverages to avoid Certain beverages may make dehydration worse. While you rehydrate, avoid:  Alcohol.  Caffeine.  Drinks that contain a lot of sugar. These include: ? High-calorie sports drinks. ? Fruit juice that is not diluted. ? Soda.  Check nutrition labels to see how much sugar or caffeine a beverage contains. Signs of dehydration recovery You may be recovering from dehydration if:  You are urinating more often than before you started rehydrating.  Your urine is clear or pale yellow.  Your energy level improves.  You vomit less frequently.  You have diarrhea less frequently.  Your appetite improves or returns to normal.  You feel less dizzy or less light-headed.  Your skin tone and color start to look more normal.  Contact a health care provider if:  You continue to have symptoms of mild dehydration, such as: ? Thirst. ? Dry lips. ? Slightly dry mouth. ? Dry, warm skin. ? Dizziness.  You continue to vomit or have diarrhea. Get help right away if:  You have symptoms of dehydration that get worse.  You feel: ? Confused. ? Weak. ? Like you are going to faint.  You have not urinated in 6-8 hours.  You have very dark urine.  You have trouble breathing.  Your heart rate while sitting still is over 100 beats a minute.  You cannot drink fluids without vomiting.  You have vomiting or diarrhea that: ? Gets worse. ? Does not go away.  You have a fever. This information is not intended to replace advice given to you by your health care provider. Make sure you discuss any questions you have with your health care provider. Document Released: 08/21/2011 Document Revised: 12/17/2015 Document Reviewed: 07/23/2015 Elsevier Interactive Patient Education  2018 [employer].   Managing Low Blood Counts During Cancer Treatment Cancer treatments such as chemotherapy and  radiation can sometimes cause a drop in the supply of blood cells in the body, including red blood cells, white blood cells, and platelets. These blood cells are produced in the body and are released into the blood to perform specific functions:  Red blood cells carry gases such as oxygen and carbon dioxide to and from your lungs.  White blood cells  help protect you from infection.  Platelets help your body to form blood clots to prevent and control bleeding.  When cancer treatments cause a drop in blood cell counts, your body may not have enough cells to keep up its normal functions. Symptoms or problems that may result will vary depending on which type of blood cells the treatment is affecting. If your blood counts are low, you can take steps to help manage any problems. How can low blood counts affect me? Low blood counts have various effects depending on the type of blood cells involved:  If you have a low number of red blood cells, you have a condition called anemia. This can cause symptoms such as: ? Feeling tired and weak. ? Feeling light-headed. ? Being short of breath.  If you have a low number of white blood cells, you may be at higher risk for infections.  If you have a low number of platelets, you may bleed more easily, or your body may have trouble stopping any bleeding. You may also have more bruising.  How to manage symptoms or prevent problems from a low blood count If you have a low blood count, you can take steps to manage symptoms or prevent problems that may develop. The steps to take will depend on which type of blood cell is low. Low red blood cells Take these steps to help manage the symptoms of anemia:  Go for a walk or do some light exercise each day.  Take short naps during the day.  Eat foods that contain a lot of iron and protein. These include leafy vegetables, meat and fish, beans, sweet potatoes, and dried fruit such as prunes, raisins, and  apricots.  Ask for help with errands and with work that needs to be done around the house. It is important to save your energy.  Take vitamins or supplements--such as iron, vitamin Y60, or folic acid--as told by your health care provider.  Practice relaxation techniques, such as yoga or meditation.  Low white blood cells Take these steps to help prevent infections:  Wash your hands often with warm, soapy water.  Avoid crowds of people and any person who has the flu or a fever.  Take care when cleaning yourself after using the bathroom. Tell your health care provider if you have any rectal sores or bleeding.  Avoid dental work. Check your mouth each day for sores or signs of infection.  Do not share utensils.  Avoid contact with pet waste. Wash your hands after handling pets.  If you get a scrape or cut, clean it thoroughly right away.  Avoid fresh plants or dried flowers.  Do not swim or wade in lakes, ponds, rivers, water parks, or hot tubs.  Follow food safety guidelines. Cook meat thoroughly and wash all raw fruits and vegetables.  You may be instructed to wear a mask when around others to protect yourself.  Low platelets Take these steps to help prevent or control bleeding and bruising:  Use an electric razor for shaving instead of a blade.  Use a soft toothbrush and be careful during oral care. Talk with your cancer care team about whether you should avoid flossing. If your mouth is bleeding, rinse it with ice water.  Avoid activities that could cause injury, such as contact sports.  Talk with your health care provider about using laxatives or stool softeners to avoid constipation.  Do not use medicines such as ibuprofen, aspirin, or naproxen unless your health care  provider tells you to.  Limit alcohol use.  Monitor any bleeding closely. If you start bleeding, hold pressure on the area for 5 minutes to stop the bleeding. Bleeding that does not stop is considered  an emergency.  What treatments can help increase a low blood count? If needed, your health care provider may recommend treatment for a low blood count. Treatment will depend on the type of blood cell that is low and the severity of your condition. Treatment options may include:  Taking medicines to help stimulate the growth of blood cells. This is an option for treating a low red blood cell count. Your health care provider may also recommend that you take iron, folic acid, or vitamin B12 supplements.  Making dietary changes. Including more iron and protein in your diet can help stimulate the growth of red blood cells.  Adjusting your current medicines to help raise blood counts.  Making changes to your treatment plan.  Having a blood transfusion. This may be done if your blood count is very low.  Contact a health care provider if:  You feel extremely tired and weak.  You have more bruising or bleeding.  You feel ill or you develop a cough.  You have swelling or redness.  You have mouth sores or a sore throat.  You have painful urination or you have blood in your urine or stool.  You are thinking of taking any new supplements or vitamins or making dietary changes. Get help right away if:  You are short of breath, have chest pain, or feel dizzy.  You have a fever or chills.  You have abdominal pain or diarrhea.  You have bleeding that will not stop. Summary  Cancer treatments such as chemotherapy and radiation can sometimes cause a drop in the supply of blood cells in the body, including red blood cells, white blood cells, and platelets.  If you have a low blood count, you can take steps to manage symptoms or prevent problems that may develop.  Depending on which type of blood cell is low, you may need to take steps to prevent infection, prevent bleeding, or manage symptoms that may develop.  If needed, your health care provider may recommend treatment for a low blood  count. This information is not intended to replace advice given to you by your health care provider. Make sure you discuss any questions you have with your health care provider. Document Released: 01/22/2016 Document Revised: 01/22/2016 Document Reviewed: 01/22/2016 Elsevier Interactive Patient Education  2018 [employer].   Managing Cancer-Related Fatigue Fatigue is the most common side effect of cancer and treatments for cancer such as chemotherapy and radiation. It can make you feel tired, weak, and drained of energy even after a small amount of activity. Unlike the fatigue that healthy people feel, cancer-related fatigue may not go away with sleep or rest. Fatigue is typically worse during treatment, but survivors may continue to feel some fatigue after treatment ends. There are several things you can do to help manage your fatigue. What can I do to help prevent cancer-related fatigue? Cancer treatment, low blood counts, appetite changes, and changes in sleep routines can all cause fatigue. Some ways to reduce or prevent fatigue include:  Eating healthy and drinking enough fluid to stay hydrated. Talk with a nutritionist or dietitian who can help you plan the best diet for you.  Staying active and getting regular exercise. Check with your cancer care team first to make sure this is safe.  Getting plenty of rest and taking it easy. Try taking a few short naps or rest breaks throughout the day.  Tell your health care provider about any cancer-related side effects. He or she may be able to adjust your medicines or treatment to reduce your fatigue. Follow these instructions at home: Lifestyle  Exercise regularly. ? Aim for 20 minutes of moderate exercise 4-5 times a week, or 60 minutes 3 times a week. ? Examples of moderate exercise include walking, biking, or swimming. Ask your health care provider what activities are safe for you. ? Remember to pace yourself and take short rest  breaks.  Find an activity or hobby that you enjoy, and spend 20-30 minutes on it a few times a week. This can help improve your mood, your focus, and your overall mental health.  Get plenty of rest and keep a regular sleep routine. ? Go to bed at the same time every night. ? Only use your bed for sleep. ? Avoid noise during sleep.  Take short naps or rest breaks during the day. Do not nap for more than 20 minutes.  Try to lower your stress. If you need help with this, talk with your health care provider about ways to manage stress. Some things you can try include: ? Doing mind and body exercises such as meditation, yoga, tai chi, or qigong. ? Writing in a journal. ? Making a to-do list and prioritizing only the important tasks. Eating and drinking  Eat a healthy diet that includes fresh fruits and vegetables, lean proteins, healthy fats, low-fat dairy products, and whole grains.  Avoid simple, refined sugars, such as soft drinks and sweets.  Try eating small meals and snacks throughout the day. A piece of fruit and some nuts are good choices for snacking.  Try to drink at least 8 glasses of water every day.  Limit the amount of alcohol and caffeine that you drink. Medicines  Talk with your health care provider about prescription medicines and over-the-counter vitamins and supplements that you take. Where to find support: Your health care provider or cancer care team will be able to give you advice about how to cope with fatigue. They can also guide you in making health and lifestyle changes that may help you feel less tired. Your health care provider may also refer you to a therapist for counseling. Talking with a therapist may help you reduce fatigue by working through any stresses or fears that you may have. Where to find more information: There are a number of trusted print and online resources that can help you learn more about cancer and its side effects. Ask your health care  provider what he or she recommends. You can start with:  The American Cancer Society: www.cancer.org  The American Society of Clinical Oncology: www.cancer.net  [employer]: www.cancer.gov  Contact a health care provider if:  You feel depressed.  Your fatigue gets worse or does not go away with sleep or rest.  You are unable to sleep at night.  You become confused or you are not able to think clearly.  You feel short of breath, feel dizzy, or have a racing heartbeat after little activity.  You are thinking about changing your medicines or treatment.  You would like to make changes to your diet or are thinking about taking dietary supplements. Summary  Fatigue is the most common side effect of cancer treatments such as chemotherapy and radiation.  Unlike the fatigue that healthy people feel,  cancer-related fatigue may not go away with sleep and rest alone.  There are several things you can do to help manage your fatigue, including eating a healthy diet, getting exercise, and keeping a regular sleep routine.  Your health care provider or cancer care team will be able to give you advice about how to cope with fatigue. They can also guide you in making health and lifestyle changes that may help you feel less tired. This information is not intended to replace advice given to you by your health care provider. Make sure you discuss any questions you have with your health care provider. Document Released: 01/24/2016 Document Revised: 01/24/2016 Document Reviewed: 01/24/2016 Elsevier Interactive Patient Education  2018 [employer].   Chemotherapy Chemotherapy is the use of medicines to stop or slow the growth of cancer cells. Depending on the type and stage of your cancer, you may have chemotherapy to:  Cure your cancer.  Slow the progression of your cancer.  Ease your cancer symptoms.  Improve the benefits of radiation treatment.  Shrink a tumor before  surgery.  Rid the body of cancer cells that remain after a tumor is surgically removed.  How is chemotherapy given? Chemotherapy may be given:  By mouth in liquid or pill form.  Through a thin tube that is inserted into a vein or artery.  By getting a shot.  By rubbing a cream or ointment on your skin.  Through liquids that are placed directly into various areas of the body, such as the abdomen, chest, or bladder.  How often is chemotherapy given? Chemotherapy may be given continuously over time, or it may be given in cycles. For example, you may take the medicine for one week out of every month. For how long will I need chemotherapy treatments? The length of treatment depends on many factors, including:  The type of cancer.  Whether the cancer has spread.  How you respond to the chemotherapy.  Whether you develop side effects.  Some types of chemotherapy medicine are given only one time. Others are given for months, years, or for life. What safety precautions must I take while on chemotherapy? Chemotherapy medicines are very strong. They will be in all of your bodily fluids, including your urine, stool, saliva, sweat, tears, vaginal secretions, and semen. You must carefully follow some safety precautions to prevent harm to others while you are using these medicines. Here are some recommended precautions:  Make sure that people who help care for you wear disposable gloves if they are going to come into contact with any of your bodily fluids. Women who are pregnant or breastfeeding should not handle any of your bodily fluids.  Wash any clothes, towels, and linens that may have your bodily fluids on them twice in a washing machine using very hot water.  Dispose of adult diapers, tampons, and sanitary napkins by first sealing them in a plastic bag.  Use a condom when having sex for at least 2 weeks after receiving your chemotherapy.  Do not share beverages or food.  Keep your  chemotherapy medicines in their original bottles. Keep them in a high, safe location, away from children. Do not expose them to heat or moisture. Do not put them in containers with other types of medicines.  Dispose of all wrappers for your chemotherapy medicines by sealing them in a separate plastic bag.  Do not throw away extra medicine, and do not flush it down the toilet. Take medicine that you are not going  to use to your health care provider's office where it can be disposed of properly.  Follow your health care providers directions for the proper disposal of needles, IV tubing, and other medical supplies that have come into contact with your chemotherapy medicines.  If you are issued a hazardous waste container, make sure you understand the directions for using it.  Wash your hands thoroughly with warm water and soap after using the bathroom. Dry your hands with disposable paper towels.  When using the toilet: ? Flush it twice after each use, including after vomiting. ? Close the lid of the toilet prior to flushing. This helps to avoid splashing. ? Both men and women should sit to use the toilet. This helps avoid splashing.  What are the side effects of chemotherapy? Side effects depend on a variety of factors, including:  The specific type of chemotherapy medicine used.  The dosage.  How long the medicine is used for.  Your overall health.  Some of the side effects you may experience include:  Fatigue and decreased energy.  Decreased appetite.  Changes in your sense of smell or taste.  Nausea.  Vomiting.  Constipation or diarrhea.  Hair loss.  Increased susceptibility to infection.  Easy bleeding.  Mouth sores.  Burning or tingling in the hands or feet.  Memory problems.  This information is not intended to replace advice given to you by your health care provider. Make sure you discuss any questions you have with your health care provider. Document  Released: 03/26/2007 Document Revised: 12/17/2015 Document Reviewed: 11/04/2013 Elsevier Interactive Patient Education  2018 [employer].

## 2018-03-01 NOTE — Care Management (Signed)
This CM met with pt at bedside. Pt states she doesn't want any additional home health services except [facility]. Pt requesting home suction machine. Order received and [facility] rep alerted of order. Marney Doctor RN,BSN [phone]

## 2018-03-01 NOTE — Progress Notes (Signed)
Tony [name] is doing MUCH better today. Thrush and Cellulitis are improving. No fevers, pain under better control now he is back on his prior to admission regimen. Meds should all stay the same as before discharge except for daily fluconazole and antibiotic (Bactrim for next 2 weeks until his foot can be evaluated again). Needs FU with oncology. He has also requested a suction machine for home to help him control all of his oral secretions- this would be helpful.  I will follow-up with home based palliative team.  [name], DO Palliative Medicine [phone]  Time: 25 min Greater than 50%  of this time was spent counseling and coordinating care related to the above assessment and plan.

## 2018-03-01 NOTE — Discharge Summary (Addendum)
Discharge Summary  Tony [name] NTI:[id number] DOB: 1965-12-05  PCP: [name], MD  Admit date: 02/25/2018 Discharge date: 03/01/2018  Time spent: 40 minutes, time spent counseling the patient, answering questions, making follow-up appointment with vascular surgery, and discussing plan with palliative care provider Dr. Hilma Favors.    Recommendations for Outpatient Follow-up:  1. Follow-up with vascular surgery.  Appointment has been made for you, on 03/13/2018 at 3 PM, please arrive at 2:45 PM. 2. Follow-up with radiation oncology 3. Follow-up with medical oncology 4. Follow-up with palliative care team 5. Follow-up with your PCP 6. Take your medications as prescribed 7. Continue home health services  Discharge Diagnoses:  Active Hospital Problems   Diagnosis Date Noted  . Malnutrition of moderate degree 02/28/2018  . Cellulitis 02/26/2018  . Malignant neoplasm metastatic to lung ([place])   . Dehydration 02/25/2018  . Sepsis ([place]) 02/25/2018  . SVC syndrome 12/31/2017  . Mass in neck   . Malignant neoplasm of upper lobe of right lung ([place]) 12/25/2017  . Gangrene of right foot ([place]) 12/20/2017  . AKI (acute kidney injury) ([place]) 12/20/2017  . Aortoiliac occlusive disease ([facility]) 11/15/2017  . Hyperlipidemia LDL goal <70 11/13/2017  . Pain of right lower extremity due to ischemia 11/13/2017  . Essential hypertension 09/01/2008    Resolved Hospital Problems  No resolved problems to display.    Discharge Condition: Stable  Diet recommendation: Resume previous diet  Vitals:   03/01/18 0655 03/01/18 0954  BP: (!) 146/93   Pulse: 90   Resp: 16   Temp: 98.3 F (36.8 C)   SpO2: 100% 97%    History of present illness:   Tony [name] a 52 y.o.malewith medical history significant ofmetastatic left lung adenocarcinomacurrently spChemotherapy finished radiation therapy on Friday, 02/22/2018,PVD, right foot gangrene, chronic back pain, chronic depression, GERD,  HLD, HTN, SVC syndrome who presented withmultiple complaints including fever chills cough productive yellow sputum urinary retention significant constipation and right leg pain. Patient was supposed to get chemotherapy on 11 September but felt to be too ill to have chemo. Instead he was started doxycycline for gangrenous right foot. Chronic shortness of breath but his cough has been getting worse with increased secretions.  Concern for dehydration.  TRH asked to admit.  Hospital course complicated by severe neck pain at site of radiation and severe odynophagia.  Palliative care team followed and with new regimen provided, odynophagia resolved.  ICU delirium noted on 02/28/2018.  Patient moved to [facility] unit with no recurrence of delirium.  03/01/2018: Patient seen and examined with his wife at bedside.  No acute events overnight.  Patient states he feels better.  He is ambulatory in the room, denies dizziness, chest pain, dyspnea, or palpitation.  He has no new complaints.  On the day of discharge, the patient was hemodynamically stable.  He will need to follow-up with vascular surgery, medical oncology, radiation oncology, palliative care team, and his PCP posthospitalization.    Hospital Course:  Active Problems:   Essential hypertension   Pain of right lower extremity due to ischemia   Hyperlipidemia LDL goal <70   Aortoiliac occlusive disease ([facility])   Gangrene of right foot (H[facility])   AKI (acute kidney injury) ([facility])   Malignant neoplasm of upper lobe of right lung ([facility])   Mass in neck   SVC syndrome   Dehydration   Sepsis ([facility])   Cellulitis   Malignant neoplasm metastatic to lung (H[facility])   Malnutrition of moderate degree  Dehydration in  the setting of advanced metastatic adenocarcinoma of left lung Resolved Avoid dehydration at home Follow up with your PCP  Sepsis secondary to post radiation cellulitis  Sepsis physiology is resolving Completed 5 days of IV cefepime and IV  vancomycin  MRSA screening positive on 02/26/2018 Start p.o. Bactrim daily on 03/01/2018 x 14 days prophylactically until you go to your follow-up appointments.  Suspected ICU delirium which has resolved Transferred to [facility] on 02/28/2018 No recurrence after transferred to [facility]  Resolving chest pain, suspect secondary to post radiation cellulitis Twelve-lead EKG independently reviewed revealed sinus rhythm with rate of 72 and no acute or specific ST-T changes. Previous troponin less than 0.033  Improving odynophagia on long-term suppressive fluconazole for severe oral pharyngeal/esophageal thrush Improving on antifungal and pain medications Palliative care team managing  Cancer related chronic pain Continue current management Also on methadone, continue Also on sublingual Dilaudid, continue  Peripheral vascular disease with right foot dry gangrene Wound care specialist recommendations Follow-up with vascular surgery Dr. Scot Dock on 03/13/2018 at 3 PM.  Arrive at 2:45 PM. Start p.o. Bactrim daily on 03/01/2018 x 14 days prophylactically until you go to your follow-up appointment with vascular surgery.  Anemia of chronic disease/symptomatic anemia/iron deficiency anemia Improving post 2 unit PRBC Hemoglobin 10.2 from 7.3 Trans-sat 8 on 12/19/2017 Continue ferrous sulfate 325 mg daily  Moderate to severe protein calorie malnutrition Due to chronic illness Albumin 2.1 Continue oral supplement  Essential hypertension Continue home antihypertensive medications Continue to monitor vital signs Blood with your PCP  Sinus tachycardia Possibly secondary to dehydration Continue to monitor on telemetry  History of DVT Post IVC filter Continue fondaparinux Follow-up with your PCP  Metastatic adenocarcinoma of left lung Follow-up with medical oncology outpatient, please call for an appointment Follow-up with radiation oncology outpatient, please call for an  appointment  Goals of care Follow-up with palliative care team outpatient, please call for an appointment   Code Status: Full code  Family Communication:  Wife at bedside.   Consultants:  Palliative care team  Procedures:  None  Antimicrobials:  Bactrim daily  DVT prophylaxis: Fondaparinux     Discharge Exam: BP (!) 146/93 (BP Location: Left Arm)   Pulse 90   Temp 98.3 F (36.8 C) (Oral)   Resp 16   Ht 6' (1.829 m)   Wt 71.9 kg   SpO2 97%   BMI 21.50 kg/m  . General: 52 y.o. year-old male well developed well nourished in no acute distress.  Alert and oriented x3. . Cardiovascular: Regular rate and rhythm with no rubs or gallops.  No thyromegaly or JVD noted.   Marland Kitchen Respiratory: Clear to auscultation with no wheezes or rales. Good inspiratory effort. . Abdomen: Soft nontender nondistended with normal bowel sounds x4 quadrants. . Musculoskeletal: No lower extremity edema. 2/4 pulses in all 4 extremities. . Skin: Radiation scarring noted at right side of the chest and neck.  Dry gangrene affecting right lower extremity. Marland Kitchen Psychiatry: Mood is appropriate for condition and setting  Discharge Instructions You were cared for by a hospitalist during your hospital stay. If you have any questions about your discharge medications or the care you received while you were in the hospital after you are discharged, you can call the unit and asked to speak with the hospitalist on call if the hospitalist that took care of you is not available. Once you are discharged, your primary care physician will handle any further medical issues. Please note that NO REFILLS for any discharge  medications will be authorized once you are discharged, as it is imperative that you return to your primary care physician (or establish a relationship with a primary care physician if you do not have one) for your aftercare needs so that they can reassess your need for medications and monitor your lab  values.  Discharge Instructions    DME Nebulizer machine   Complete by:  As directed    Patient needs a nebulizer to treat with the following condition:  Lung cancer ([facility])     Allergies as of 03/01/2018      Reactions   Pork-derived Products Other (See Comments)   UNSPECIFIED REASON - Pt doesn't eat pork   Shellfish-derived Products Other (See Comments)   Doesn't want to eat   Morphine And Related Other (See Comments)   Hallucinations      Medication List    STOP taking these medications   acetaminophen 500 MG tablet Commonly known as:  TYLENOL   atorvastatin 40 MG tablet Commonly known as:  LIPITOR   doxycycline 100 MG tablet Commonly known as:  VIBRA-TABS   omeprazole 40 MG capsule Commonly known as:  PRILOSEC   prochlorperazine 10 MG tablet Commonly known as:  COMPAZINE     TAKE these medications   albuterol (2.5 MG/3ML) 0.083% nebulizer solution Commonly known as:  PROVENTIL Take 3 mLs (2.5 mg total) by nebulization every 4 (four) hours as needed for wheezing or shortness of breath.   amLODipine 5 MG tablet Commonly known as:  NORVASC Take 1 tablet (5 mg total) by mouth daily. Start taking on:  03/02/2018   belladonna-PHENObarbital 16.2 MG/5ML Elix Commonly known as:  DONNATAL Take 10 mLs (32.4 mg total) by mouth 3 (three) times daily as needed for cramping or pain.   chlorhexidine gluconate (MEDLINE KIT) 0.12 % solution Commonly known as:  PERIDEX 15 mLs by Mouth Rinse route 2 (two) times daily.   dexamethasone 4 MG tablet Commonly known as:  DECADRON Take 1 tablet (4 mg total) by mouth daily.   dronabinol 2.5 MG capsule Commonly known as:  MARINOL Take 1 capsule (2.5 mg total) by mouth 2 (two) times daily before lunch and supper.   DULoxetine 60 MG capsule Commonly known as:  CYMBALTA Take 1 capsule (60 mg total) by mouth daily.   ferrous sulfate 325 (65 FE) MG tablet Take 1 tablet (325 mg total) by mouth daily with breakfast.   fluconazole  100 MG tablet Commonly known as:  DIFLUCAN Take 1 tablet (100 mg total) by mouth daily.   fondaparinux 7.5 MG/0.6ML Soln injection Commonly known as:  ARIXTRA Inject 0.6 mLs (7.5 mg total) into the skin daily at 6 PM for 26 days.   gabapentin 400 MG capsule Commonly known as:  NEURONTIN Take 1 capsule (400 mg total) by mouth 2 (two) times daily.   HYDROmorphone 8 MG tablet Commonly known as:  DILAUDID Take 1 tablet (8 mg total) by mouth every 2 (two) hours as needed for severe pain. What changed:  how much to take   LORazepam 1 MG tablet Commonly known as:  ATIVAN Take 0.5 tablets (0.5 mg total) by mouth 2 (two) times daily AND 1 tablet (1 mg total) at bedtime. What changed:  See the new instructions.   magic mouthwash Soln Take 5-10 mLs by mouth 2 (two) times daily.   methadone 10 MG tablet Commonly known as:  DOLOPHINE Take 2 tablets (20 mg total) by mouth every 8 (eight) hours.   methocarbamol  500 MG tablet Commonly known as:  ROBAXIN Take 1 tablet (500 mg total) by mouth 2 (two) times daily. What changed:    medication strength  how much to take  when to take this   multivitamin with minerals Tabs tablet Take 1 tablet by mouth daily. Start taking on:  03/02/2018   NARCAN 4 MG/0.1ML Liqd nasal spray kit Generic drug:  naloxone Place 1 spray into the nose daily as needed. Overdose of medication   OLANZapine 5 MG tablet Commonly known as:  ZYPREXA Take 1 tablet (5 mg total) by mouth at bedtime.   ondansetron 8 MG tablet Commonly known as:  ZOFRAN Take 1 tablet (8 mg total) by mouth 2 (two) times daily as needed for refractory nausea / vomiting. Start on day 3 after chemo. What changed:  Another medication with the same name was removed. Continue taking this medication, and follow the directions you see here.   pantoprazole 40 MG tablet Commonly known as:  PROTONIX Take 1 tablet (40 mg total) by mouth daily.   polyethylene glycol packet Commonly known as:   MIRALAX / GLYCOLAX Take 17 g by mouth daily.   povidone-iodine 10 % external solution Commonly known as:  BETADINE Apply topically as needed for wound care. What changed:    how much to take  when to take this   senna-docusate 8.6-50 MG tablet Commonly known as:  Senokot-S Take 2 tablets by mouth 2 (two) times daily.   sulfamethoxazole-trimethoprim 800-160 MG tablet Commonly known as:  BACTRIM DS,SEPTRA DS Take 1 tablet by mouth daily.   triamcinolone cream 0.1 % Commonly known as:  KENALOG Apply topically 2 (two) times daily.            Durable Medical Equipment  (From admission, onward)         Start     Ordered   03/01/18 1023  For home use only DME Suction  Once    Question:  Suction  Answer:  Oral   03/01/18 1022   03/01/18 0000  DME Nebulizer machine    Question:  Patient needs a nebulizer to treat with the following condition  Answer:  Lung cancer ([facility])   03/01/18 1139         Allergies  Allergen Reactions  . Pork-Derived Products Other (See Comments)    UNSPECIFIED REASON - Pt doesn't eat pork  . Shellfish-Derived Products Other (See Comments)    Doesn't want to eat  . Morphine And Related Other (See Comments)    Hallucinations   Follow-up Information    Angelia Mould, MD. Call in 1 day(s).   Specialties:  Vascular Surgery, Cardiology Why:  Appointment has been made for you on 03/13/18 at [facility]. Please arrive at 2:45 PM. Contact information: [place] [address] [phone]        Brunetta Genera, MD. Call in 1 day(s).   Specialties:  Hematology, Oncology Why:  Please call to make a post hospital appointment. Contact information: [place] [address] [phone]        Tyler Pita, MD. Call in 1 day(s).   Specialty:  Radiation Oncology Why:  Please call for a post hospital appointment. Contact information: [place] [phone] [place], [place] Call in 1 day(s).   Specialty:  Internal Medicine Why:  Please call for a post hospital appointment. Contact information: [place]  [place] [address] [phone]            The results of significant diagnostics from this hospitalization (including imaging, microbiology, ancillary and laboratory) are listed below for reference.    Significant Diagnostic Studies: Dg Chest 2 View  Result Date: 02/25/2018 CLINICAL DATA:  Cough with fever and chills EXAM: CHEST - 2 VIEW COMPARISON:  Chest CT January 07, 2018; chest radiograph December 16, 2017 FINDINGS: There remains adenopathy throughout the right azygos and paratracheal regions, similar to most recent study. There is no edema or consolidation. Heart size and pulmonary vascularity are normal. No evident bone lesions. IMPRESSION: Right-sided adenopathy, unchanged from recent study. Other areas of adenopathy seen on prior CT are not appreciable by radiography. No edema or consolidation. Previously noted mass in the posterior segment right upper lobe on chest CT is not appreciable by radiography. Heart size normal. Electronically Signed   By: Lowella Grip III M.D.   On: 02/25/2018 14:12   Dg Chest 2 View  Result Date: 02/16/2018 CLINICAL DATA:  Lung cancer, former smoker with dyspnea and chills today. EXAM: CHEST - 2 VIEW COMPARISON:  01/07/2018 chest CT FINDINGS: Redemonstration of right paratracheal soft tissue prominence compatible with known mediastinal lymphadenopathy. Midline trachea. No pulmonary consolidation. Lung volumes are slightly low with crowding of interstitial lung markings. No aggressive osseous lesions. Heart is top-normal in size. Nonaneurysmal thoracic aorta. IMPRESSION: 1. Low lung volumes with crowding of interstitial lung markings. No alveolar consolidations. 2. Known right paratracheal soft tissue prominence consistent with lymphadenopathy. No significant change. Electronically Signed    By: Ashley Royalty M.D.   On: 02/16/2018 02:59   Ct Angio Chest Pe W And/or Wo Contrast  Result Date: 02/25/2018 CLINICAL DATA:  Productive cough. Metastatic cancer with involvement of the right upper lobe EXAM: CT ANGIOGRAPHY CHEST WITH CONTRAST TECHNIQUE: Multidetector CT imaging of the chest was performed using the standard protocol during bolus administration of intravenous contrast. Multiplanar CT image reconstructions and MIPs were obtained to evaluate the vascular anatomy. CONTRAST:  142m ISOVUE-370 IOPAMIDOL (ISOVUE-370) INJECTION 76% COMPARISON:  Multiple exams, including 01/07/2018 FINDINGS: Despite efforts by the technologist and patient, motion artifact is present on today's exam and could not be eliminated. This reduces exam sensitivity and specificity. Cardiovascular: No filling defect is identified in the pulmonary arterial tree to suggest pulmonary embolus. There is narrowing of the right upper lobe pulmonary artery due to extrinsic mass effect from tumor. Difficult to be certain that the tumor is not invading the right upper lobe pulmonary artery given the complete 360 degree engulfing of the vessel by the tumor, but I do not see obvious filling defect within the narrowed segment of pulmonary artery traversing the tumor. The contour is also fairly similar to 01/07/18. Mediastinum/Nodes: Bulky right paratracheal tumor as before, measuring up to 4.6 cm in short axis, formerly 4.8 cm. A prevascular node measures 1.6 cm in short axis on image 44/6, previously 2.1 cm. Right hilar node 1.9 cm in short axis on image 50/6, formerly 2.0 cm. Conglomerate right supraclavicular adenopathy difficult to separate from the sternocleidomastoid muscle, less sharply defined but mildly reduced in overall size compared to prior exam. A left supraclavicular node measures 2.2 cm in short axis on image 9/6, formerly the same. Bilateral pathologic axillary adenopathy. An index left axillary node measures 2.5 cm in short  axis on image 31/6, significantly worsened, previously 0.9 cm. Right axillary and subpectoral adenopathy to prior. Lungs/Pleura: Paraseptal emphysema. The right upper lobe lung nodule measures  up to 0.8 cm in short axis on image 43/12, previously 1.3 cm by my measurements. New ground-glass density band in the right upper lobe for example on image 55/12, likely from alveolitis, less likely a new focus of low-grade adenocarcinoma. Small amount of frothy fluid in the trachea and right mainstem bronchus. Upper Abdomen: Unremarkable Musculoskeletal: Unremarkable Review of the MIP images confirms the above findings. IMPRESSION: 1. No filling defect is identified in the pulmonary arterial tree to suggest pulmonary embolus. Stable degree of mass effect on the right upper lobe pulmonary artery by the right mediastinal tumor. 2. Mixed appearance of the tumor burden, on balance the tumor burden is mildly reduced. Specifically, the bulky mediastinal tumor is mildly reduced in size; the right upper lobe bandlike pulmonary nodule is reduced in size; and mediastinal and right supraclavicular adenopathy is mildly reduced. However, left axillary adenopathy is significantly increased. 3. Other imaging findings of potential clinical significance: Emphysema (ICD10-J43.9). Mild focus of alveolitis in the right upper lobe. Electronically Signed   By: Van Clines M.D.   On: 02/25/2018 18:21   Dg Foot Complete Right  Result Date: 02/25/2018 CLINICAL DATA:  Necrosis of the great toe, second toe and third toe. EXAM: RIGHT FOOT COMPLETE - 3+ VIEW COMPARISON:  None. FINDINGS: Pronounced deficiency of the soft tissue of the distal toes, most notable at the second and third toes. No plain radiographic evidence of osteomyelitis. No recent fracture. Old healed fracture of the proximal phalanx of the fifth toe. IMPRESSION: Pronounced soft tissue deficiency of the distal toes. No plain radiographic evidence of osteomyelitis.  Electronically Signed   By: Nelson Chimes M.D.   On: 02/25/2018 16:56    Microbiology: Recent Results (from the past 240 hour(s))  Culture, blood (Routine x 2)     Status: None (Preliminary result)   Collection Time: 02/25/18  1:45 PM  Result Value Ref Range Status   Specimen Description   Final    BLOOD LEFT FOREARM Performed at [facility], [place] [address] [place], [place] [address]    Special Requests   Final    BOTTLES DRAWN AEROBIC AND ANAEROBIC Blood Culture results may not be optimal due to an excessive volume of blood received in culture bottles Performed at [facility] [address] [place], [place] [address]    Culture   Final    NO GROWTH 4 DAYS Performed at [facility], [place] [address] [place], [place] [address]    Report Status PENDING  Incomplete  Culture, blood (Routine x 2)     Status: None (Preliminary result)   Collection Time: 02/25/18  5:12 PM  Result Value Ref Range Status   Specimen Description   Final    BLOOD LEFT FOREARM Performed at [facility] [address] [place], [place] [address]    Special Requests   Final    BOTTLES DRAWN AEROBIC AND ANAEROBIC Blood Culture adequate volume Performed at [facility] [address], [place], [place] [address]    Culture   Final    NO GROWTH 4 DAYS Performed at [facility], [place] [address] [place], [place] [address]    Report Status PENDING  Incomplete  MRSA PCR Screening     Status: Abnormal   Collection Time: 02/26/18  9:20 AM  Result Value Ref Range Status   MRSA by PCR POSITIVE (A) NEGATIVE Final    Comment:  The GeneXpert MRSA Assay (FDA approved for NASAL specimens only), is one component of a comprehensive MRSA colonization surveillance program. It is not intended to diagnose MRSA infection nor to guide or monitor treatment for MRSA infections. RESULT CALLED TO, READ BACK BY AND VERIFIED  WITH: T.DUONG RN 02/26/2018 1208 JR Performed at [facility], [place] [address] [place], [place] [address]      Labs: Basic Metabolic Panel: Recent Labs  Lab 02/25/18 1344 02/26/18 0500 02/28/18 0444 03/01/18 0427  NA 140 136  --  141  K 4.1 3.9  --  3.9  CL 100 103  --  110  CO2 25 19*  --  22  GLUCOSE 79 77  --  119*  BUN 28* 17  --  19  CREATININE 1.00 0.78  --  0.69  CALCIUM 9.6 8.4*  --  8.8*  MG  --  1.6* 2.0  --   PHOS  --  3.0  --   --    Liver Function Tests: Recent Labs  Lab 02/25/18 1344 02/26/18 0500  AST 22 21  ALT 18 15  ALKPHOS 132* 106  BILITOT 0.9 1.0  PROT 7.3 6.1*  ALBUMIN 2.7* 2.1*   No results for input(s): LIPASE, AMYLASE in the last 168 hours. No results for input(s): AMMONIA in the last 168 hours. CBC: Recent Labs  Lab 02/25/18 1344 02/26/18 0500 02/27/18 0318 02/28/18 0444 03/01/18 0427  WBC 7.5 6.9 6.9 8.6 9.1  NEUTROABS 7.0  --   --   --   --   HGB 9.6* 7.8* 7.3* 10.2* 10.3*  HCT 31.1* 24.7* 23.5* 32.2* 32.7*  MCV 90.9 90.5 89.7 87.7 88.1  PLT 228 171 176 194 213   Cardiac Enzymes: Recent Labs  Lab 02/26/18 0145 02/26/18 0950 02/26/18 1339  TROPONINI <0.03 0.03* 0.03*   BNP: BNP (last 3 results) No results for input(s): BNP in the last 8760 hours.  ProBNP (last 3 results) No results for input(s): PROBNP in the last 8760 hours.  CBG: Recent Labs  Lab 02/27/18 0415  GLUCAP 116*       Signed:  Kayleen Memos, MD [facility] 03/01/2018, 12:26 PM

## 2018-03-02 LAB — CULTURE, BLOOD (ROUTINE X 2)
CULTURE: NO GROWTH
Culture: NO GROWTH
Special Requests: ADEQUATE

## 2018-03-07 ENCOUNTER — Inpatient Hospital Stay: Payer: BLUE CROSS/BLUE SHIELD

## 2018-03-07 ENCOUNTER — Other Ambulatory Visit: Payer: Self-pay | Admitting: Hematology

## 2018-03-07 ENCOUNTER — Inpatient Hospital Stay (HOSPITAL_BASED_OUTPATIENT_CLINIC_OR_DEPARTMENT_OTHER): Payer: BLUE CROSS/BLUE SHIELD | Admitting: Hematology

## 2018-03-07 VITALS — BP 165/97 | HR 106 | Temp 98.7°F | Resp 16

## 2018-03-07 DIAGNOSIS — I871 Compression of vein: Secondary | ICD-10-CM

## 2018-03-07 DIAGNOSIS — G893 Neoplasm related pain (acute) (chronic): Secondary | ICD-10-CM | POA: Diagnosis not present

## 2018-03-07 DIAGNOSIS — F329 Major depressive disorder, single episode, unspecified: Secondary | ICD-10-CM | POA: Diagnosis not present

## 2018-03-07 DIAGNOSIS — Z7189 Other specified counseling: Secondary | ICD-10-CM

## 2018-03-07 DIAGNOSIS — C799 Secondary malignant neoplasm of unspecified site: Secondary | ICD-10-CM

## 2018-03-07 DIAGNOSIS — Z7901 Long term (current) use of anticoagulants: Secondary | ICD-10-CM | POA: Diagnosis not present

## 2018-03-07 DIAGNOSIS — Z87891 Personal history of nicotine dependence: Secondary | ICD-10-CM | POA: Diagnosis not present

## 2018-03-07 DIAGNOSIS — D630 Anemia in neoplastic disease: Secondary | ICD-10-CM

## 2018-03-07 DIAGNOSIS — Z5111 Encounter for antineoplastic chemotherapy: Secondary | ICD-10-CM | POA: Diagnosis not present

## 2018-03-07 DIAGNOSIS — M7989 Other specified soft tissue disorders: Secondary | ICD-10-CM | POA: Diagnosis not present

## 2018-03-07 DIAGNOSIS — D6869 Other thrombophilia: Secondary | ICD-10-CM

## 2018-03-07 DIAGNOSIS — C3492 Malignant neoplasm of unspecified part of left bronchus or lung: Secondary | ICD-10-CM

## 2018-03-07 DIAGNOSIS — I739 Peripheral vascular disease, unspecified: Secondary | ICD-10-CM

## 2018-03-07 DIAGNOSIS — L538 Other specified erythematous conditions: Secondary | ICD-10-CM

## 2018-03-07 DIAGNOSIS — F419 Anxiety disorder, unspecified: Secondary | ICD-10-CM | POA: Diagnosis not present

## 2018-03-07 DIAGNOSIS — I82B11 Acute embolism and thrombosis of right subclavian vein: Secondary | ICD-10-CM | POA: Diagnosis not present

## 2018-03-07 DIAGNOSIS — Z86718 Personal history of other venous thrombosis and embolism: Secondary | ICD-10-CM

## 2018-03-07 DIAGNOSIS — Z792 Long term (current) use of antibiotics: Secondary | ICD-10-CM

## 2018-03-07 DIAGNOSIS — I10 Essential (primary) hypertension: Secondary | ICD-10-CM | POA: Diagnosis not present

## 2018-03-07 DIAGNOSIS — C3411 Malignant neoplasm of upper lobe, right bronchus or lung: Secondary | ICD-10-CM

## 2018-03-07 DIAGNOSIS — R110 Nausea: Secondary | ICD-10-CM | POA: Diagnosis not present

## 2018-03-07 LAB — CBC WITH DIFFERENTIAL/PLATELET
BASOS PCT: 0 %
Basophils Absolute: 0 10*3/uL (ref 0.0–0.1)
Eosinophils Absolute: 0 10*3/uL (ref 0.0–0.5)
Eosinophils Relative: 0 %
HEMATOCRIT: 32.9 % — AB (ref 38.4–49.9)
Hemoglobin: 10.1 g/dL — ABNORMAL LOW (ref 13.0–17.1)
Lymphocytes Relative: 8 %
Lymphs Abs: 1 10*3/uL (ref 0.9–3.3)
MCH: 28.1 pg (ref 27.2–33.4)
MCHC: 30.7 g/dL — AB (ref 32.0–36.0)
MCV: 91.4 fL (ref 79.3–98.0)
MONOS PCT: 5 %
Monocytes Absolute: 0.6 10*3/uL (ref 0.1–0.9)
NEUTROS ABS: 11.3 10*3/uL — AB (ref 1.5–6.5)
NEUTROS PCT: 87 %
Platelets: 274 10*3/uL (ref 140–400)
RBC: 3.6 MIL/uL — AB (ref 4.20–5.82)
RDW: 17.3 % — ABNORMAL HIGH (ref 11.0–14.6)
WBC: 13 10*3/uL — ABNORMAL HIGH (ref 4.0–10.3)

## 2018-03-07 LAB — CMP (CANCER CENTER ONLY)
ALBUMIN: 2.4 g/dL — AB (ref 3.5–5.0)
ALT: 23 U/L (ref 0–44)
AST: 19 U/L (ref 15–41)
Alkaline Phosphatase: 113 U/L (ref 38–126)
Anion gap: 10 (ref 5–15)
BUN: 14 mg/dL (ref 6–20)
CO2: 28 mmol/L (ref 22–32)
Calcium: 9.1 mg/dL (ref 8.9–10.3)
Chloride: 104 mmol/L (ref 98–111)
Creatinine: 0.6 mg/dL — ABNORMAL LOW (ref 0.61–1.24)
GFR, Estimated: >60 mL/min (ref >60)
GLUCOSE: 92 mg/dL (ref 70–99)
POTASSIUM: 4.1 mmol/L (ref 3.5–5.1)
SODIUM: 142 mmol/L (ref 135–145)
TOTAL PROTEIN: 6.5 g/dL (ref 6.5–8.1)

## 2018-03-07 MED ORDER — PALONOSETRON HCL INJECTION 0.25 MG/5ML
0.2500 mg | Freq: Once | INTRAVENOUS | Status: AC
  Administered 2018-03-07: 0.25 mg via INTRAVENOUS

## 2018-03-07 MED ORDER — SODIUM CHLORIDE 0.9 % IV SOLN
45.0000 mg/m2 | Freq: Once | INTRAVENOUS | Status: AC
  Administered 2018-03-07: 90 mg via INTRAVENOUS
  Filled 2018-03-07: qty 15

## 2018-03-07 MED ORDER — SODIUM CHLORIDE 0.9 % IV SOLN
276.2000 mg | Freq: Once | INTRAVENOUS | Status: AC
  Administered 2018-03-07: 280 mg via INTRAVENOUS
  Filled 2018-03-07: qty 28

## 2018-03-07 MED ORDER — FAMOTIDINE IN NACL 20-0.9 MG/50ML-% IV SOLN
20.0000 mg | Freq: Once | INTRAVENOUS | Status: AC
  Administered 2018-03-07: 20 mg via INTRAVENOUS

## 2018-03-07 MED ORDER — SODIUM CHLORIDE 0.9 % IV SOLN
Freq: Once | INTRAVENOUS | Status: AC
  Administered 2018-03-07: 10:00:00 via INTRAVENOUS
  Filled 2018-03-07: qty 250

## 2018-03-07 MED ORDER — DIPHENHYDRAMINE HCL 50 MG/ML IJ SOLN
50.0000 mg | Freq: Once | INTRAMUSCULAR | Status: AC
Start: 2018-03-07 — End: 2018-03-07
  Administered 2018-03-07: 50 mg via INTRAVENOUS

## 2018-03-07 MED ORDER — SODIUM CHLORIDE 0.9 % IV SOLN
20.0000 mg | Freq: Once | INTRAVENOUS | Status: AC
  Administered 2018-03-07: 20 mg via INTRAVENOUS
  Filled 2018-03-07: qty 2

## 2018-03-07 MED ORDER — PALONOSETRON HCL INJECTION 0.25 MG/5ML
INTRAVENOUS | Status: AC
  Filled 2018-03-07: qty 5

## 2018-03-07 MED ORDER — FAMOTIDINE IN NACL 20-0.9 MG/50ML-% IV SOLN
INTRAVENOUS | Status: AC
  Filled 2018-03-07: qty 50

## 2018-03-07 MED ORDER — DIPHENHYDRAMINE HCL 50 MG/ML IJ SOLN
INTRAMUSCULAR | Status: AC
  Filled 2018-03-07: qty 1

## 2018-03-07 NOTE — Progress Notes (Signed)
Per Dr. Irene Limbo, okay to treat with abnormal vital signs today

## 2018-03-07 NOTE — Progress Notes (Signed)
HEMATOLOGY/ONCOLOGY CLINIC NOTE  Date of Service: 03/07/2018  Patient Care Team: [name], MD as PCP - General (Family Medicine) Brunetta Genera, MD as Medical Oncologist (Hematology)  CHIEF COMPLAINTS/PURPOSE OF CONSULTATION:  Metastatic Lung Adenocarcinoma  HISTORY OF PRESENTING ILLNESS:  Tony [name] is a wonderful 52 y.o. male who has been referred to [place] by Dr. Durenda Hurt for evaluation, management and followup for newly diagnosed metastatic poorly differentiated adenocarcinoma likely of lung primary. Upper GI series showed no overt esophageal or gastric tumor . Has been seen by radiation oncology and was transferred to [facility] long and today started on palliative radiation for pain control and for impending SVC syndrome due to bulky disease in his chest. He has completed antibiotics for his left groin infection and the wound incisions of healed. Still having significant right upper extremity swelling.  Has extensive bilateral cervical and mediastinal lymphadenopathy. Continues to be on anticoagulation with fondaparinux for his DVT. We discussed the lab studies imaging findings and the role for palliative chemotherapy in the setting.  He is agreeable to this and would like to pursue an aggressive course of treatment understanding that it might not be curative. We discussed starting him on weekly carboplatin + Taxol while on concurrent RT. Pending foundation one and PDL1 testing results   Interval History:   Tony [name] returns today for management, evaluation, and C3 Carbo/taxol treatment of his adenocarcinoma of the left lung. The patient's last visit with [place] was on 02/20/18. He is accompanied today by his wife. The pt reports that he is doing well overall.   The pt notes that he is feeling much better overall after being discharged from the hospital on 03/01/18. He notes that he was placed on prophylactic antibiotics ongoing with concern for his gangrenous  changes of the right foot. He denies any mouth sores or concerns for thrush, noting that this was successfully treated. The pt also notes that his ankles are swollen bilaterally and is following up with vascular surgery soon.   He notes that his pain is currently well controlled and denies any current concerns for infections. He is doing his best to eat better and to gain weight again. He notes that he continues using Senna S and Miralax for his opiate related constipation relief and denies abdominal pains.   The pt reports that his left armpit mass is continuing to grow and is painful to the touch. He adds that a mass at the back left side of his neck is growing as well.   Lab results today (03/07/18) of CBC w/diff, CMP, and Reticulocytes is as follows: all values are WNL except for WBC at 13.0k, RBC at 3.60, HGB at 10.1, HCT at 32.9, MCHC at 30.7, RDW at 17.3, ANC at 11.3k, Creatinine at 0.60, Albumin at 2.4, Total Bilirubin at <0.2.  On review of systems, pt reports eating well, enlarging neck and left armpit masses, stable right foot gangrenous changes, stable chest wall skin pain, moving his bowels well, and denies SOB, CP, abdominal pains, fevers, chills, mouth sores, and any other symptoms    MEDICAL HISTORY:  Past Medical History:  Diagnosis Date  . Anxiety   . Arterial occlusive disease    multilevel arterial occlusive disease/notes 11/13/2017  . Arthritis    "left knee" (11/13/2017)  . Cancer ([place])    stage 4 lung cancer  . Chronic back pain   . Chronic lower back pain   . Depression   . Gastric  ulcer due to Helicobacter pylori 1610  . GERD (gastroesophageal reflux disease)   . High cholesterol   . History of blood transfusion   . Hypertension   . IBS (irritable bowel syndrome)   . Mesenteric adenitis 2011   presumed/H&P  . Migraines    "use to suffer migraines years ago" (11/13/2017)  . Peripheral vascular disease ([place])   . Pneumonia 2011   "wife states he didn't have  pneumonia"  . Ulcer     SURGICAL HISTORY: Past Surgical History:  Procedure Laterality Date  . AORTA - BILATERAL FEMORAL ARTERY BYPASS GRAFT Bilateral 11/15/2017   Procedure: AORTA BIFEMORAL BYPASS GRAFT;  Surgeon: Angelia Mould, MD;  Location: [place];  Service: Vascular;  Laterality: Bilateral;  . BACK SURGERY  X 4   "procedure where they burn the nerves every 6 months"  . EMBOLECTOMY Left 12/01/2017   Procedure: THROMBECTOMY OF LEFT AORTAFEMORAL BYPASS, THROMBECTOMY OF TIBIAL ARTERY;  Surgeon: Rosetta Posner, MD;  Location: [facility] OR;  Service: Vascular;  Laterality: Left;  . ESOPHAGOGASTRODUODENOSCOPY  08/22/2011   Procedure: ESOPHAGOGASTRODUODENOSCOPY (EGD);  Surgeon: [name], MD;  Location: [place];  Service: Gastroenterology;  Laterality: N/A;  . FEMORAL-POPLITEAL BYPASS GRAFT Right 11/15/2017   Procedure: Right FEMORAL-Above knee POPLITEAL ARTERY Bypass Graft using nonreversed saphenous vein ;  Surgeon: Angelia Mould, MD;  Location: [place];  Service: Vascular;  Laterality: Right;  . FEMORAL-POPLITEAL BYPASS GRAFT Left 12/01/2017   Procedure: BYPASS GRAFT FEMORAL- ABOVE KNEE POPLITEAL ARTERY WITH VEIN;  Surgeon: Rosetta Posner, MD;  Location: [place];  Service: Vascular;  Laterality: Left;  . INTRAOPERATIVE ARTERIOGRAM Right 11/15/2017   Procedure: INTRA OPERATIVE ARTERIOGRAM;  Surgeon: Angelia Mould, MD;  Location: [place];  Service: Vascular;  Laterality: Right;  . INTRAOPERATIVE ARTERIOGRAM Left 12/01/2017   Procedure: INTRA OPERATIVE ARTERIOGRAM OF LEFT LEG;  Surgeon: Rosetta Posner, MD;  Location: [place];  Service: Vascular;  Laterality: Left;  . IR US GUIDE BX ASP/DRAIN  12/18/2017  . KNEE ARTHROSCOPY Left X 5    SOCIAL HISTORY: Social History   Socioeconomic History  . Marital status: Married    Spouse name: Not on file  . Number of children: 3  . Years of education: Not on file  . Highest education level: Not on file  Occupational History  . Occupation:  Admin. Assistant    Employer: [employer]  Social Needs  . Financial resource strain: Not on file  . Food insecurity:    Worry: Not on file    Inability: Not on file  . Transportation needs:    Medical: Not on file    Non-medical: Not on file  Tobacco Use  . Smoking status: Former Smoker    Packs/day: 1.00    Years: 24.00    Pack years: 24.00    Types: Cigarettes    Start date: 11/28/2017    Last attempt to quit: 12/04/2017    Years since quitting: 0.2  . Smokeless tobacco: Never Used  Substance and Sexual Activity  . Alcohol use: Not Currently  . Drug use: Not Currently    Types: Marijuana    Comment: 11/13/2017  "last drug use was in ~ 1987 "  . Sexual activity: Not on file  Lifestyle  . Physical activity:    Days per week: Not on file    Minutes per session: Not on file  . Stress: Not on file  Relationships  . Social connections:    Talks  on phone: Not on file    Gets together: Not on file    Attends religious service: Not on file    Active member of club or organization: Not on file    Attends meetings of clubs or organizations: Not on file    Relationship status: Not on file  . Intimate partner violence:    Fear of current or ex partner: Not on file    Emotionally abused: Not on file    Physically abused: Not on file    Forced sexual activity: Not on file  Other Topics Concern  . Not on file  Social History Narrative   Lives with wife and two children.  Administrator at [employer]: Family History  Problem Relation Age of Onset  . Hypertension Mother   . Heart disease Mother   . Hypertension Father   . Coronary artery disease Father 87  . Heart attack Brother 82  . Coronary artery disease Brother   . Heart disease Brother   . Diabetes Neg Hx   . Stroke Neg Hx     ALLERGIES:  is allergic to pork-derived products; shellfish-derived products; and morphine and related.  MEDICATIONS:  Current Outpatient Medications  Medication Sig Dispense  Refill  . albuterol (PROVENTIL) (2.5 MG/3ML) 0.083% nebulizer solution Take 3 mLs (2.5 mg total) by nebulization every 4 (four) hours as needed for wheezing or shortness of breath. 75 mL 0  . amLODipine (NORVASC) 5 MG tablet Take 1 tablet (5 mg total) by mouth daily. 30 tablet 0  . chlorhexidine gluconate, MEDLINE KIT, (PERIDEX) 0.12 % solution 15 mLs by Mouth Rinse route 2 (two) times daily. 120 mL 0  . dexamethasone (DECADRON) 4 MG tablet Take 1 tablet (4 mg total) by mouth daily. 60 tablet 3  . dronabinol (MARINOL) 2.5 MG capsule Take 1 capsule (2.5 mg total) by mouth 2 (two) times daily before lunch and supper. 60 capsule 0  . DULoxetine (CYMBALTA) 60 MG capsule Take 1 capsule (60 mg total) by mouth daily. 30 capsule 0  . ferrous sulfate 325 (65 FE) MG tablet Take 1 tablet (325 mg total) by mouth daily with breakfast. 30 tablet 0  . fluconazole (DIFLUCAN) 100 MG tablet Take 1 tablet (100 mg total) by mouth daily. 30 tablet 0  . fondaparinux (ARIXTRA) 7.5 MG/0.6ML SOLN injection Inject 0.6 mLs (7.5 mg total) into the skin daily at 6 PM for 26 days. 15.6 mL 0  . gabapentin (NEURONTIN) 400 MG capsule Take 1 capsule (400 mg total) by mouth 2 (two) times daily. 60 capsule 3  . HYDROmorphone (DILAUDID) 8 MG tablet Take 1 tablet (8 mg total) by mouth every 2 (two) hours as needed for severe pain. 20 tablet 0  . LORazepam (ATIVAN) 1 MG tablet Take 0.5 tablets (0.5 mg total) by mouth 2 (two) times daily AND 1 tablet (1 mg total) at bedtime. (Patient taking differently: 1 mg at bedtime only) 60 tablet 3  . magic mouthwash SOLN Take 5-10 mLs by mouth 2 (two) times daily.  3  . methadone (DOLOPHINE) 10 MG tablet Take 2 tablets (20 mg total) by mouth every 8 (eight) hours. 180 tablet 0  . methocarbamol (ROBAXIN) 500 MG tablet Take 1 tablet (500 mg total) by mouth 2 (two) times daily. 20 tablet 0  . Multiple Vitamin (MULTIVITAMIN WITH MINERALS) TABS tablet Take 1 tablet by mouth daily. 30 tablet 0  . NARCAN 4  MG/0.1ML LIQD nasal spray kit Place 1  spray into the nose daily as needed. Overdose of medication  0  . OLANZapine (ZYPREXA) 5 MG tablet Take 1 tablet (5 mg total) by mouth at bedtime. 30 tablet 0  . ondansetron (ZOFRAN) 8 MG tablet Take 1 tablet (8 mg total) by mouth 2 (two) times daily as needed for refractory nausea / vomiting. Start on day 3 after chemo. 30 tablet 1  . pantoprazole (PROTONIX) 40 MG tablet Take 1 tablet (40 mg total) by mouth daily. 30 tablet 3  . polyethylene glycol (MIRALAX / GLYCOLAX) packet Take 17 g by mouth daily. 14 each 0  . povidone-iodine (BETADINE) 10 % external solution Apply topically as needed for wound care. (Patient taking differently: Apply 1 application topically 3 (three) times daily as needed for wound care. ) 480 mL 0  . senna-docusate (SENOKOT-S) 8.6-50 MG tablet Take 2 tablets by mouth 2 (two) times daily. 30 tablet 0  . sulfamethoxazole-trimethoprim (BACTRIM DS,SEPTRA DS) 800-160 MG tablet Take 1 tablet by mouth daily. 14 tablet 0  . triamcinolone cream (KENALOG) 0.1 % Apply topically 2 (two) times daily. 30 g 0   No current facility-administered medications for this visit.    Facility-Administered Medications Ordered in Other Visits  Medication Dose Route Frequency Provider Last Rate Last Dose  . CARBOplatin (PARAPLATIN) 280 mg in sodium chloride 0.9 % 250 mL chemo infusion  280 mg Intravenous Once Brunetta Genera, MD      . dexamethasone (DECADRON) 20 mg in sodium chloride 0.9 % 50 mL IVPB  20 mg Intravenous Once Brunetta Genera, MD      . famotidine (PEPCID) IVPB 20 mg premix  20 mg Intravenous Once Brunetta Genera, MD      . PACLitaxel (TAXOL) 90 mg in sodium chloride 0.9 % 250 mL chemo infusion (</= '80mg'$ /m2)  45 mg/m2 (Treatment Plan Recorded) Intravenous Once Brunetta Genera, MD        REVIEW OF SYSTEMS:    A 10+ POINT REVIEW OF SYSTEMS WAS OBTAINED including neurology, dermatology, psychiatry, cardiac, respiratory, lymph,  extremities, GI, GU, Musculoskeletal, constitutional, breasts, reproductive, HEENT.  All pertinent positives are noted in the HPI.  All others are negative.   PHYSICAL EXAMINATION: ECOG PERFORMANCE STATUS: 2 - Symptomatic, <50% confined to bed  VS reviewed  GENERAL:alert, in no acute distress and comfortable SKIN: grade 2 radiation skin injury over right neck and right upper chest  EYES: conjunctiva are pink and non-injected, sclera anicteric OROPHARYNX: MMM, no exudates, no oropharyngeal erythema or ulceration NECK: b/l cervical LNadenopathy R>>L improved, improved erythema over neck, supraclavicular area and chest wall with decreased skin edema  LYMPH:  Progressive palpable b/l cervical lymphadenopathy, b/l axillary LNadenopathy LUNGS: clear to auscultation b/l with normal respiratory effort HEART: regular rate & rhythm ABDOMEN:  normoactive bowel sounds , non tender, not distended. No palpable hepatosplenomegaly.  Extremity: 2+ bilateral pedal edema, resolved edema in RUE. Rt foot gangrenous changes PSYCH: alert & oriented x 3 with fluent speech NEURO: no focal motor/sensory deficits   LABORATORY DATA:  I have reviewed the data as listed  . CBC Latest Ref Rng & Units 03/07/2018 03/01/2018 02/28/2018  WBC 4.0 - 10.3 K/uL 13.0(H) 9.1 8.6  Hemoglobin 13.0 - 17.1 g/dL 10.1(L) 10.3(L) 10.2(L)  Hematocrit 38.4 - 49.9 % 32.9(L) 32.7(L) 32.2(L)  Platelets 140 - 400 K/uL 274 213 194    . CMP Latest Ref Rng & Units 03/07/2018 03/01/2018 02/26/2018  Glucose 70 - 99 mg/dL 92 119(H) 77  BUN 6 -  20 mg/dL '14 19 17  '$ Creatinine 0.61 - 1.24 mg/dL 0.60(L) 0.69 0.78  Sodium 135 - 145 mmol/L 142 141 136  Potassium 3.5 - 5.1 mmol/L 4.1 3.9 3.9  Chloride 98 - 111 mmol/L 104 110 103  CO2 22 - 32 mmol/L 28 22 19(L)  Calcium 8.9 - 10.3 mg/dL 9.1 8.8(L) 8.4(L)  Total Protein 6.5 - 8.1 g/dL 6.5 - 6.1(L)  Total Bilirubin 0.3 - 1.2 mg/dL <0.2(L) - 1.0  Alkaline Phos 38 - 126 U/L 113 - 106  AST 15 - 41 U/L  19 - 21  ALT 0 - 44 U/L 23 - 15   12/18/17 Soft tissue biopsy:    12/18/17 Foundation One Results:      RADIOGRAPHIC STUDIES: I have personally reviewed the radiological images as listed and agreed with the findings in the report. Dg Chest 2 View  Result Date: 02/25/2018 CLINICAL DATA:  Cough with fever and chills EXAM: CHEST - 2 VIEW COMPARISON:  Chest CT January 07, 2018; chest radiograph December 16, 2017 FINDINGS: There remains adenopathy throughout the right azygos and paratracheal regions, similar to most recent study. There is no edema or consolidation. Heart size and pulmonary vascularity are normal. No evident bone lesions. IMPRESSION: Right-sided adenopathy, unchanged from recent study. Other areas of adenopathy seen on prior CT are not appreciable by radiography. No edema or consolidation. Previously noted mass in the posterior segment right upper lobe on chest CT is not appreciable by radiography. Heart size normal. Electronically Signed   By: Lowella Grip III M.D.   On: 02/25/2018 14:12   Dg Chest 2 View  Result Date: 02/16/2018 CLINICAL DATA:  Lung cancer, former smoker with dyspnea and chills today. EXAM: CHEST - 2 VIEW COMPARISON:  01/07/2018 chest CT FINDINGS: Redemonstration of right paratracheal soft tissue prominence compatible with known mediastinal lymphadenopathy. Midline trachea. No pulmonary consolidation. Lung volumes are slightly low with crowding of interstitial lung markings. No aggressive osseous lesions. Heart is top-normal in size. Nonaneurysmal thoracic aorta. IMPRESSION: 1. Low lung volumes with crowding of interstitial lung markings. No alveolar consolidations. 2. Known right paratracheal soft tissue prominence consistent with lymphadenopathy. No significant change. Electronically Signed   By: Ashley Royalty M.D.   On: 02/16/2018 02:59   Ct Angio Chest Pe W And/or Wo Contrast  Result Date: 02/25/2018 CLINICAL DATA:  Productive cough. Metastatic cancer with involvement  of the right upper lobe EXAM: CT ANGIOGRAPHY CHEST WITH CONTRAST TECHNIQUE: Multidetector CT imaging of the chest was performed using the standard protocol during bolus administration of intravenous contrast. Multiplanar CT image reconstructions and MIPs were obtained to evaluate the vascular anatomy. CONTRAST:  126m ISOVUE-370 IOPAMIDOL (ISOVUE-370) INJECTION 76% COMPARISON:  Multiple exams, including 01/07/2018 FINDINGS: Despite efforts by the technologist and patient, motion artifact is present on today's exam and could not be eliminated. This reduces exam sensitivity and specificity. Cardiovascular: No filling defect is identified in the pulmonary arterial tree to suggest pulmonary embolus. There is narrowing of the right upper lobe pulmonary artery due to extrinsic mass effect from tumor. Difficult to be certain that the tumor is not invading the right upper lobe pulmonary artery given the complete 360 degree engulfing of the vessel by the tumor, but I do not see obvious filling defect within the narrowed segment of pulmonary artery traversing the tumor. The contour is also fairly similar to 01/07/18. Mediastinum/Nodes: Bulky right paratracheal tumor as before, measuring up to 4.6 cm in short axis, formerly 4.8 cm. A prevascular node measures  1.6 cm in short axis on image 44/6, previously 2.1 cm. Right hilar node 1.9 cm in short axis on image 50/6, formerly 2.0 cm. Conglomerate right supraclavicular adenopathy difficult to separate from the sternocleidomastoid muscle, less sharply defined but mildly reduced in overall size compared to prior exam. A left supraclavicular node measures 2.2 cm in short axis on image 9/6, formerly the same. Bilateral pathologic axillary adenopathy. An index left axillary node measures 2.5 cm in short axis on image 31/6, significantly worsened, previously 0.9 cm. Right axillary and subpectoral adenopathy to prior. Lungs/Pleura: Paraseptal emphysema. The right upper lobe lung nodule  measures up to 0.8 cm in short axis on image 43/12, previously 1.3 cm by my measurements. New ground-glass density band in the right upper lobe for example on image 55/12, likely from alveolitis, less likely a new focus of low-grade adenocarcinoma. Small amount of frothy fluid in the trachea and right mainstem bronchus. Upper Abdomen: Unremarkable Musculoskeletal: Unremarkable Review of the MIP images confirms the above findings. IMPRESSION: 1. No filling defect is identified in the pulmonary arterial tree to suggest pulmonary embolus. Stable degree of mass effect on the right upper lobe pulmonary artery by the right mediastinal tumor. 2. Mixed appearance of the tumor burden, on balance the tumor burden is mildly reduced. Specifically, the bulky mediastinal tumor is mildly reduced in size; the right upper lobe bandlike pulmonary nodule is reduced in size; and mediastinal and right supraclavicular adenopathy is mildly reduced. However, left axillary adenopathy is significantly increased. 3. Other imaging findings of potential clinical significance: Emphysema (ICD10-J43.9). Mild focus of alveolitis in the right upper lobe. Electronically Signed   By: Van Clines M.D.   On: 02/25/2018 18:21   Dg Foot Complete Right  Result Date: 02/25/2018 CLINICAL DATA:  Necrosis of the great toe, second toe and third toe. EXAM: RIGHT FOOT COMPLETE - 3+ VIEW COMPARISON:  None. FINDINGS: Pronounced deficiency of the soft tissue of the distal toes, most notable at the second and third toes. No plain radiographic evidence of osteomyelitis. No recent fracture. Old healed fracture of the proximal phalanx of the fifth toe. IMPRESSION: Pronounced soft tissue deficiency of the distal toes. No plain radiographic evidence of osteomyelitis. Electronically Signed   By: Nelson Chimes M.D.   On: 02/25/2018 16:56    ASSESSMENT & PLAN:  52 y.o. male with  1. Metastatic lung adenocarcinoma with Right upper lobe noduleand extensive  mediastinal and cervical lymphadenopathy -Biopsy consistent with poorly differentiated adenocarcinoma likely lung ( no imaging evidence of primary tumor other than lung cancer) Foundation One results reviewed - no targetable lung cancer mutation.(EGFR, ROS 1 BRAF and ALK gene rearrangement neg) CT  abd no overt disease. MRI brain neg for metastatic disease UGI series- no evidence of UGI malignancy  2. Metastatic cancer related hypercoagulable status   3. ExtensiveDeep venous thrombosis involving the right subclavian vein, right brachiocephalic vein, and right internal jugular vein- due to cancer + vascular surgery + surgical site infection. -resolved RUE swelling.  4.s/p  Rt proximal upper venous compression by bulky mediastinal tumor with impending SVC syndrome.  5. PAD with recent vascular bypass and rt foot gangrene. Rt grion infection -resolved s/p antibiotics.  6. Cancer related pain --- mx by palliative care  7. Neck and rt upper chest wall erythema and induration -- likely primarily due to radiation related skin toxicity - improving.  8. Left groin would infection post-op - resolved  9. Anemia due to metastatic cancer, blood loss from recent surgery.  Plan -transfuse prn for hgb <8 to tolerate tx and for wound healing   10. S/p recent hospitalization for cellulitis with fevers - improved. PLAN:  -palliative care following for ongoing pain mx - pain better controlled on a methadone based regimen. -Continue following palliative care Dr. [name] for pain management -conitnue Fondaparinux for anticoagulation.  Discussed pt labwork today, 03/07/18; blood counts and chemistries are stable -Continue with biopsy of left axilla mass on 03/18/18 -Continue with prophylactic antibiotics (on bactrim 1 tab po daily) -The pt has no prohibitive toxicities from continuing weekly Carboplatin and Taxol at this time.   -After completing Carbo/Taxol this week and next week,  will move to every 3 week Carbo/Taxol/Atezolizumab if tolerated with G-CSF support -Will see the pt back in one week -Continue follow up with vascular surgery    -please schedule next cycles of carbo/taxol with labs/MD visit in 1 week   All of the patients questions were answered with apparent satisfaction. The patient knows to call the clinic with any problems, questions or concerns.  The total time spent in the appt was 25 minutes and more than 50% was on counseling and direct patient cares.     Sullivan Lone MD MS AAHIVMS [facility] Hematology/Oncology Physician [facility]  (Office):       [phone] (Work cell):  [phone] (Fax):           [phone]  I, Baldwin Jamaica, am acting as a scribe for Dr. Irene Limbo  .I have reviewed the above documentation for accuracy and completeness, and I agree with the above. Brunetta Genera MD

## 2018-03-07 NOTE — Patient Instructions (Signed)
   [facility] Discharge Instructions for Patients Receiving Chemotherapy  Today you received the following chemotherapy agents Taxol and Carboplatin   To help prevent nausea and vomiting after your treatment, we encourage you to take your nausea medication as directed.    If you develop nausea and vomiting that is not controlled by your nausea medication, call the clinic.   BELOW ARE SYMPTOMS THAT SHOULD BE REPORTED IMMEDIATELY:  *FEVER GREATER THAN 100.5 F  *CHILLS WITH OR WITHOUT FEVER  NAUSEA AND VOMITING THAT IS NOT CONTROLLED WITH YOUR NAUSEA MEDICATION  *UNUSUAL SHORTNESS OF BREATH  *UNUSUAL BRUISING OR BLEEDING  TENDERNESS IN MOUTH AND THROAT WITH OR WITHOUT PRESENCE OF ULCERS  *URINARY PROBLEMS  *BOWEL PROBLEMS  UNUSUAL RASH Items with * indicate a potential emergency and should be followed up as soon as possible.  Feel free to call the clinic should you have any questions or concerns. The clinic phone number is [phone].  Please show the CHEMO ALERT CARD at check-in to the Emergency Department and triage nurse.   

## 2018-03-08 ENCOUNTER — Telehealth: Payer: Self-pay

## 2018-03-08 NOTE — Telephone Encounter (Signed)
Spoke with Tony [name]. And reminded her of the appointment for next week. Per 9/26 los added additional appointments and will be mailing it to her with a calender enclosed.

## 2018-03-12 NOTE — Progress Notes (Signed)
HEMATOLOGY/ONCOLOGY CLINIC NOTE  Date of Service: 03/13/2018  Patient Care Team: [name], MD as PCP - General (Family Medicine) Brunetta Genera, MD as Medical Oncologist (Hematology)  CHIEF COMPLAINTS/PURPOSE OF CONSULTATION:  Metastatic Lung Adenocarcinoma  HISTORY OF PRESENTING ILLNESS:  Tony [name] is a wonderful 52 y.o. male who has been referred to [place] by Dr. Durenda Hurt for evaluation, management and followup for newly diagnosed metastatic poorly differentiated adenocarcinoma likely of lung primary. Upper GI series showed no overt esophageal or gastric tumor . Has been seen by radiation oncology and was transferred to [facility] long and today started on palliative radiation for pain control and for impending SVC syndrome due to bulky disease in his chest. He has completed antibiotics for his left groin infection and the wound incisions of healed. Still having significant right upper extremity swelling.  Has extensive bilateral cervical and mediastinal lymphadenopathy. Continues to be on anticoagulation with fondaparinux for his DVT. We discussed the lab studies imaging findings and the role for palliative chemotherapy in the setting.  He is agreeable to this and would like to pursue an aggressive course of treatment understanding that it might not be curative. We discussed starting him on weekly carboplatin + Taxol while on concurrent RT. Pending foundation one and PDL1 testing results   Interval History:   Tony [name] returns today for management, evaluation, and C4 Carbo/taxol treatment of his adenocarcinoma of the left lung. The patient's last visit with [place] was on 03/07/18. He is accompanied today by his wife. The pt reports that he is doing well overall.   The pt reports that he has not had any fevers in the interim. He adds that he has continued on Bactrim and Nistatin. He notes that his foot with known gangrenous changes hurts a little and the previously  seen swelling is improving. He notes that his radiation related skin injury over the right side of his neck is slowly improving. The pt also notes that his left armpit mass is continuing to enlarge and is quit bothersome.   The pt adds that he is noticing some new knots on his face next to his ears.   He notes that marinol has been improving his appetite some and has not had any problems tolerating this.   Lab results today (03/13/18) of CBC w/diff, CMP is as follows: all values are WNL except for WBC at 13.0k, RBC at 3.59, HGB at 10.1, HCT at 31.2, RDW at 16.6, ANC at 12.1k, Lymphs abs at 400, Albumin at 2.5, Total Bilirubin at <0.2. 03/13/18 Magnesium is WNL at 1.8  On review of systems, pt reports eating better, enlarging left armpit mass, improving neck skin injury, resolving ankle swelling, and denies fevers, concerns for infections, and any other symptoms.    MEDICAL HISTORY:  Past Medical History:  Diagnosis Date  . Anxiety   . Arterial occlusive disease    multilevel arterial occlusive disease/notes 11/13/2017  . Arthritis    "left knee" (11/13/2017)  . Cancer ([place])    stage 4 lung cancer  . Chronic back pain   . Chronic lower back pain   . Depression   . Gastric ulcer due to Helicobacter pylori 5643  . GERD (gastroesophageal reflux disease)   . High cholesterol   . History of blood transfusion   . Hypertension   . IBS (irritable bowel syndrome)   . Mesenteric adenitis 2011   presumed/H&P  . Migraines    "use to suffer migraines  years ago" (11/13/2017)  . Peripheral vascular disease ([place])   . Pneumonia 2011   "wife states he didn't have pneumonia"  . Ulcer     SURGICAL HISTORY: Past Surgical History:  Procedure Laterality Date  . AORTA - BILATERAL FEMORAL ARTERY BYPASS GRAFT Bilateral 11/15/2017   Procedure: AORTA BIFEMORAL BYPASS GRAFT;  Surgeon: Angelia Mould, MD;  Location: [place];  Service: Vascular;  Laterality: Bilateral;  . BACK SURGERY  X 4   "procedure  where they burn the nerves every 6 months"  . EMBOLECTOMY Left 12/01/2017   Procedure: THROMBECTOMY OF LEFT AORTAFEMORAL BYPASS, THROMBECTOMY OF TIBIAL ARTERY;  Surgeon: Rosetta Posner, MD;  [facility] OR;  Service: Vascular;  Laterality: Left;  . ESOPHAGOGASTRODUODENOSCOPY  08/22/2011   Procedure: ESOPHAGOGASTRODUODENOSCOPY (EGD);  Surgeon: [name], MD;  Location: [place];  Service: Gastroenterology;  Laterality: N/A;  . FEMORAL-POPLITEAL BYPASS GRAFT Right 11/15/2017   Procedure: Right FEMORAL-Above knee POPLITEAL ARTERY Bypass Graft using nonreversed saphenous vein ;  Surgeon: Angelia Mould, MD;  Location: [place];  Service: Vascular;  Laterality: Right;  . FEMORAL-POPLITEAL BYPASS GRAFT Left 12/01/2017   Procedure: BYPASS GRAFT FEMORAL- ABOVE KNEE POPLITEAL ARTERY WITH VEIN;  Surgeon: Rosetta Posner, MD;  Location: [place];  Service: Vascular;  Laterality: Left;  . INTRAOPERATIVE ARTERIOGRAM Right 11/15/2017   Procedure: INTRA OPERATIVE ARTERIOGRAM;  Surgeon: Angelia Mould, MD;  Location: [place];  Service: Vascular;  Laterality: Right;  . INTRAOPERATIVE ARTERIOGRAM Left 12/01/2017   Procedure: INTRA OPERATIVE ARTERIOGRAM OF LEFT LEG;  Surgeon: Rosetta Posner, MD;  Location: [place];  Service: Vascular;  Laterality: Left;  . IR US GUIDE BX ASP/DRAIN  12/18/2017  . KNEE ARTHROSCOPY Left X 5    SOCIAL HISTORY: Social History   Socioeconomic History  . Marital status: Married    Spouse name: Not on file  . Number of children: 3  . Years of education: Not on file  . Highest education level: Not on file  Occupational History  . Occupation: Admin. Assistant    Employer: [employer]  Social Needs  . Financial resource strain: Not on file  . Food insecurity:    Worry: Not on file    Inability: Not on file  . Transportation needs:    Medical: Not on file    Non-medical: Not on file  Tobacco Use  . Smoking status: Former Smoker    Packs/day: 1.00    Years: 24.00    Pack  years: 24.00    Types: Cigarettes    Start date: 11/28/2017    Last attempt to quit: 12/04/2017    Years since quitting: 0.2  . Smokeless tobacco: Never Used  Substance and Sexual Activity  . Alcohol use: Not Currently  . Drug use: Not Currently    Types: Marijuana    Comment: 11/13/2017  "last drug use was in ~ 1987 "  . Sexual activity: Not on file  Lifestyle  . Physical activity:    Days per week: Not on file    Minutes per session: Not on file  . Stress: Not on file  Relationships  . Social connections:    Talks on phone: Not on file    Gets together: Not on file    Attends religious service: Not on file    Active member of club or organization: Not on file    Attends meetings of clubs or organizations: Not on file    Relationship status: Not on file  .  Intimate partner violence:    Fear of current or ex partner: Not on file    Emotionally abused: Not on file    Physically abused: Not on file    Forced sexual activity: Not on file  Other Topics Concern  . Not on file  Social History Narrative   Lives with wife and two children.  Administrator at [employer]: Family History  Problem Relation Age of Onset  . Hypertension Mother   . Heart disease Mother   . Hypertension Father   . Coronary artery disease Father 14  . Heart attack Brother 17  . Coronary artery disease Brother   . Heart disease Brother   . Diabetes Neg Hx   . Stroke Neg Hx     ALLERGIES:  is allergic to pork-derived products; shellfish-derived products; and morphine and related.  MEDICATIONS:  Current Outpatient Medications  Medication Sig Dispense Refill  . albuterol (PROVENTIL) (2.5 MG/3ML) 0.083% nebulizer solution Take 3 mLs (2.5 mg total) by nebulization every 4 (four) hours as needed for wheezing or shortness of breath. 75 mL 0  . amLODipine (NORVASC) 5 MG tablet Take 1 tablet (5 mg total) by mouth daily. 30 tablet 0  . chlorhexidine gluconate, MEDLINE KIT, (PERIDEX) 0.12 %  solution 15 mLs by Mouth Rinse route 2 (two) times daily. 120 mL 0  . dexamethasone (DECADRON) 4 MG tablet Take 1 tablet (4 mg total) by mouth daily. 60 tablet 3  . dronabinol (MARINOL) 5 MG capsule Take 1 capsule (5 mg total) by mouth 2 (two) times daily before lunch and supper. 60 capsule 0  . DULoxetine (CYMBALTA) 60 MG capsule Take 1 capsule (60 mg total) by mouth daily. 30 capsule 0  . ferrous sulfate 325 (65 FE) MG tablet Take 1 tablet (325 mg total) by mouth daily with breakfast. 30 tablet 0  . fluconazole (DIFLUCAN) 100 MG tablet Take 1 tablet (100 mg total) by mouth daily. 30 tablet 0  . fondaparinux (ARIXTRA) 7.5 MG/0.6ML SOLN injection Inject 0.6 mLs (7.5 mg total) into the skin daily at 6 PM for 26 days. 15.6 mL 0  . gabapentin (NEURONTIN) 400 MG capsule Take 1 capsule (400 mg total) by mouth 2 (two) times daily. 60 capsule 3  . HYDROmorphone (DILAUDID) 8 MG tablet Take 1 tablet (8 mg total) by mouth every 2 (two) hours as needed for severe pain. 20 tablet 0  . LORazepam (ATIVAN) 1 MG tablet Take 0.5 tablets (0.5 mg total) by mouth 2 (two) times daily AND 1 tablet (1 mg total) at bedtime. (Patient taking differently: 1 mg at bedtime only) 60 tablet 3  . magic mouthwash SOLN Take 5-10 mLs by mouth 2 (two) times daily.  3  . methadone (DOLOPHINE) 10 MG tablet Take 2 tablets (20 mg total) by mouth every 8 (eight) hours. 180 tablet 0  . methocarbamol (ROBAXIN) 500 MG tablet Take 1 tablet (500 mg total) by mouth 2 (two) times daily. 20 tablet 0  . Multiple Vitamin (MULTIVITAMIN WITH MINERALS) TABS tablet Take 1 tablet by mouth daily. 30 tablet 0  . NARCAN 4 MG/0.1ML LIQD nasal spray kit Place 1 spray into the nose daily as needed. Overdose of medication  0  . OLANZapine (ZYPREXA) 5 MG tablet Take 1 tablet (5 mg total) by mouth at bedtime. 30 tablet 0  . ondansetron (ZOFRAN) 8 MG tablet Take 1 tablet (8 mg total) by mouth 2 (two) times daily as needed for refractory nausea /  vomiting. Start on  day 3 after chemo. 30 tablet 1  . pantoprazole (PROTONIX) 40 MG tablet Take 1 tablet (40 mg total) by mouth daily. 30 tablet 3  . polyethylene glycol (MIRALAX / GLYCOLAX) packet Take 17 g by mouth daily. 14 each 0  . povidone-iodine (BETADINE) 10 % external solution Apply topically as needed for wound care. (Patient taking differently: Apply 1 application topically 3 (three) times daily as needed for wound care. ) 480 mL 0  . senna-docusate (SENOKOT-S) 8.6-50 MG tablet Take 2 tablets by mouth 2 (two) times daily. 30 tablet 0  . sulfamethoxazole-trimethoprim (BACTRIM DS,SEPTRA DS) 800-160 MG tablet Take 1 tablet by mouth daily. 14 tablet 0  . triamcinolone cream (KENALOG) 0.1 % Apply topically 2 (two) times daily. 30 g 0   No current facility-administered medications for this visit.     REVIEW OF SYSTEMS:    A 10+ POINT REVIEW OF SYSTEMS WAS OBTAINED including neurology, dermatology, psychiatry, cardiac, respiratory, lymph, extremities, GI, GU, Musculoskeletal, constitutional, breasts, reproductive, HEENT.  All pertinent positives are noted in the HPI.  All others are negative.   PHYSICAL EXAMINATION: ECOG PERFORMANCE STATUS: 2 - Symptomatic, <50% confined to bed  VS reviewed   GENERAL:alert, in no acute distress and comfortable SKIN: grade 2 radiation skin injury over right neck and right upper chest EYES: conjunctiva are pink and non-injected, sclera anicteric OROPHARYNX: MMM, no exudates, no oropharyngeal erythema or ulceration NECK: b/l cervical LNadenpoathy R>>L improved, improved erythema over neck, supraclavicular area and chest wall with decreased skin edema  LYMPH:  Progressive palpable b/l cervical lymphadenopathy, b/l axillary LNadenopathy LUNGS: clear to auscultation b/l with normal respiratory effort HEART: regular rate & rhythm ABDOMEN:  normoactive bowel sounds , non tender, not distended. No palpable hepatosplenomegaly.  Extremity: 2+ right pedal edema, 1+ left pedal  edema, resolved edema in RUE. Right foot gangrenous changes.  PSYCH: alert & oriented x 3 with fluent speech NEURO: no focal motor/sensory deficits   LABORATORY DATA:  I have reviewed the data as listed  . CBC Latest Ref Rng & Units 03/13/2018 03/07/2018 03/01/2018  WBC 4.0 - 10.3 K/uL 13.0(H) 13.0(H) 9.1  Hemoglobin 13.0 - 17.1 g/dL 10.1(L) 10.1(L) 10.3(L)  Hematocrit 38.4 - 49.9 % 31.2(L) 32.9(L) 32.7(L)  Platelets 140 - 400 K/uL 292 274 213    . CMP Latest Ref Rng & Units 03/13/2018 03/07/2018 03/01/2018  Glucose 70 - 99 mg/dL 90 92 119(H)  BUN 6 - 20 mg/dL _0 Creatinine 0.61 - 1.24 mg/dL 0.63 0.60(L) 0.69  Sodium 135 - 145 mmol/L 138 142 141  Potassium 3.5 - 5.1 mmol/L 4.7 4.1 3.9  Chloride 98 - 111 mmol/L 100 104 110  CO2 22 - 32 mmol/L _1 Calcium 8.9 - 10.3 mg/dL 9.8 9.1 8.8(L)  Total Protein 6.5 - 8.1 g/dL 7.0 6.5 -  Total Bilirubin 0.3 - 1.2 mg/dL <0.2(L) <0.2(L) -  Alkaline Phos 38 - 126 U/L 114 113 -  AST 15 - 41 U/L 23 19 -  ALT 0 - 44 U/L 20 23 -   12/18/17 Soft tissue biopsy:    12/18/17 [facility] One Results:      RADIOGRAPHIC STUDIES: I have personally reviewed the radiological images as listed and agreed with the findings in the report. Dg Chest 2 View  Result Date: 02/25/2018 CLINICAL DATA:  Cough with fever and chills EXAM: CHEST - 2 VIEW COMPARISON:  Chest CT January 07, 2018; chest radiograph December 16, 2017  FINDINGS: There remains adenopathy throughout the right azygos and paratracheal regions, similar to most recent study. There is no edema or consolidation. Heart size and pulmonary vascularity are normal. No evident bone lesions. IMPRESSION: Right-sided adenopathy, unchanged from recent study. Other areas of adenopathy seen on prior CT are not appreciable by radiography. No edema or consolidation. Previously noted mass in the posterior segment right upper lobe on chest CT is not appreciable by radiography. Heart size normal. Electronically Signed    By: Lowella Grip III M.D.   On: 02/25/2018 14:12   Dg Chest 2 View  Result Date: 02/16/2018 CLINICAL DATA:  Lung cancer, former smoker with dyspnea and chills today. EXAM: CHEST - 2 VIEW COMPARISON:  01/07/2018 chest CT FINDINGS: Redemonstration of right paratracheal soft tissue prominence compatible with known mediastinal lymphadenopathy. Midline trachea. No pulmonary consolidation. Lung volumes are slightly low with crowding of interstitial lung markings. No aggressive osseous lesions. Heart is top-normal in size. Nonaneurysmal thoracic aorta. IMPRESSION: 1. Low lung volumes with crowding of interstitial lung markings. No alveolar consolidations. 2. Known right paratracheal soft tissue prominence consistent with lymphadenopathy. No significant change. Electronically Signed   By: Ashley Royalty M.D.   On: 02/16/2018 02:59   Ct Angio Chest Pe W And/or Wo Contrast  Result Date: 02/25/2018 CLINICAL DATA:  Productive cough. Metastatic cancer with involvement of the right upper lobe EXAM: CT ANGIOGRAPHY CHEST WITH CONTRAST TECHNIQUE: Multidetector CT imaging of the chest was performed using the standard protocol during bolus administration of intravenous contrast. Multiplanar CT image reconstructions and MIPs were obtained to evaluate the vascular anatomy. CONTRAST:  117m ISOVUE-370 IOPAMIDOL (ISOVUE-370) INJECTION 76% COMPARISON:  Multiple exams, including 01/07/2018 FINDINGS: Despite efforts by the technologist and patient, motion artifact is present on today's exam and could not be eliminated. This reduces exam sensitivity and specificity. Cardiovascular: No filling defect is identified in the pulmonary arterial tree to suggest pulmonary embolus. There is narrowing of the right upper lobe pulmonary artery due to extrinsic mass effect from tumor. Difficult to be certain that the tumor is not invading the right upper lobe pulmonary artery given the complete 360 degree engulfing of the vessel by the tumor, but  I do not see obvious filling defect within the narrowed segment of pulmonary artery traversing the tumor. The contour is also fairly similar to 01/07/18. Mediastinum/Nodes: Bulky right paratracheal tumor as before, measuring up to 4.6 cm in short axis, formerly 4.8 cm. A prevascular node measures 1.6 cm in short axis on image 44/6, previously 2.1 cm. Right hilar node 1.9 cm in short axis on image 50/6, formerly 2.0 cm. Conglomerate right supraclavicular adenopathy difficult to separate from the sternocleidomastoid muscle, less sharply defined but mildly reduced in overall size compared to prior exam. A left supraclavicular node measures 2.2 cm in short axis on image 9/6, formerly the same. Bilateral pathologic axillary adenopathy. An index left axillary node measures 2.5 cm in short axis on image 31/6, significantly worsened, previously 0.9 cm. Right axillary and subpectoral adenopathy to prior. Lungs/Pleura: Paraseptal emphysema. The right upper lobe lung nodule measures up to 0.8 cm in short axis on image 43/12, previously 1.3 cm by my measurements. New ground-glass density band in the right upper lobe for example on image 55/12, likely from alveolitis, less likely a new focus of low-grade adenocarcinoma. Small amount of frothy fluid in the trachea and right mainstem bronchus. Upper Abdomen: Unremarkable Musculoskeletal: Unremarkable Review of the MIP images confirms the above findings. IMPRESSION: 1. No filling defect  is identified in the pulmonary arterial tree to suggest pulmonary embolus. Stable degree of mass effect on the right upper lobe pulmonary artery by the right mediastinal tumor. 2. Mixed appearance of the tumor burden, on balance the tumor burden is mildly reduced. Specifically, the bulky mediastinal tumor is mildly reduced in size; the right upper lobe bandlike pulmonary nodule is reduced in size; and mediastinal and right supraclavicular adenopathy is mildly reduced. However, left axillary  adenopathy is significantly increased. 3. Other imaging findings of potential clinical significance: Emphysema (ICD10-J43.9). Mild focus of alveolitis in the right upper lobe. Electronically Signed   By: Van Clines M.D.   On: 02/25/2018 18:21   Dg Foot Complete Right  Result Date: 02/25/2018 CLINICAL DATA:  Necrosis of the great toe, second toe and third toe. EXAM: RIGHT FOOT COMPLETE - 3+ VIEW COMPARISON:  None. FINDINGS: Pronounced deficiency of the soft tissue of the distal toes, most notable at the second and third toes. No plain radiographic evidence of osteomyelitis. No recent fracture. Old healed fracture of the proximal phalanx of the fifth toe. IMPRESSION: Pronounced soft tissue deficiency of the distal toes. No plain radiographic evidence of osteomyelitis. Electronically Signed   By: Nelson Chimes M.D.   On: 02/25/2018 16:56    ASSESSMENT & PLAN:  52 y.o. male with  1. Metastatic lung adenocarcinoma with Right upper lobe noduleand extensive mediastinal and cervical lymphadenopathy -Biopsy consistent with poorly differentiated adenocarcinoma likely lung ( no imaging evidence of primary tumor other than lung cancer) Foundation One results reviewed - no targetable lung cancer mutation.(EGFR, ROS 1 BRAF and ALK gene rearrangement neg) CT  abd no overt disease. MRI brain neg for metastatic disease UGI series- no evidence of UGI malignancy  2. Metastatic cancer related hypercoagulable status   3. ExtensiveDeep venous thrombosis involving the right subclavian vein, right brachiocephalic vein, and right internal jugular vein- due to cancer + vascular surgery + surgical site infection. -resolved RUE swelling.  4.s/p  Rt proximal upper venous compression by bulky mediastinal tumor with impending SVC syndrome.  5. PAD with recent vascular bypass and rt foot gangrene. Rt grion infection -resolved s/p antibiotics.  6. Cancer related pain --- mx by palliative care  7. Neck  and rt upper chest wall erythema and induration -- likely primarily due to radiation related skin toxicity - improving.  8. Left groin would infection post-op - resolved  9. Anemia due to metastatic cancer, blood loss from recent surgery. Plan -transfuse prn for hgb <8 to tolerate tx and for wound healing   10. S/p recent hospitalization for cellulitis with fevers - improved.  PLAN: -palliative care following for ongoing pain mx - pain better controlled on a methadone based regimen. -Continue following palliative care Dr. [name] for pain management -conitnue Fondaparinux for anticoagulation.  -Continue with biopsy of left axilla mass on 03/18/18 -After completing Carbo/Taxol this week, will move to every 3 week Carbo/Taxol/Atezolizumab if tolerated with G-CSF support -Continue follow up with vascular surgery  -Discussed pt labwork today, 03/13/18; blood counts and chemistries are stable, magnesium is normal  -Continue prophylactic Bactrim and Nystatin  -The pt has no prohibitive toxicities from continuing Carboplatin and Taxol at this time.   -Increase Marinol to 72m BID from 2.52mBID -Continue eating well and staying hydrated   -Will refer pt to Rad Onc for consideration of RT to enlarging left axillary mass    Changing to Carboplatin/taxol/Atezolizumab with neulasta q3weeks from next week with labs [place] flush with each  lab RTC with Dr Irene Limbo in 2 weeks for toxicity check with labs Radiation oncology referral for consideration of consideration of palliative RT to left axillary mass F/u as scheduled for left axillary LN biopsy    All of the patients questions were answered with apparent satisfaction. The patient knows to call the clinic with any problems, questions or concerns.  The total time spent in the appt was 25 minutes and more than 50% was on counseling and direct patient cares.     Sullivan Lone MD MS AAHIVMS [facility] Hematology/Oncology Physician [facility]  (Office):       [phone] (Work cell):  [phone] (Fax):           [phone]  I, Baldwin Jamaica, am acting as a scribe for Dr. Irene Limbo  .I have reviewed the above documentation for accuracy and completeness, and I agree with the above. Brunetta Genera MD

## 2018-03-13 ENCOUNTER — Inpatient Hospital Stay: Payer: BLUE CROSS/BLUE SHIELD | Attending: Hematology

## 2018-03-13 ENCOUNTER — Telehealth: Payer: Self-pay | Admitting: Hematology

## 2018-03-13 ENCOUNTER — Inpatient Hospital Stay: Payer: BLUE CROSS/BLUE SHIELD

## 2018-03-13 ENCOUNTER — Inpatient Hospital Stay (HOSPITAL_BASED_OUTPATIENT_CLINIC_OR_DEPARTMENT_OTHER): Payer: BLUE CROSS/BLUE SHIELD | Admitting: Hematology

## 2018-03-13 ENCOUNTER — Ambulatory Visit: Payer: BLUE CROSS/BLUE SHIELD

## 2018-03-13 VITALS — BP 148/95 | HR 106 | Temp 98.1°F | Resp 18 | Ht 72.0 in | Wt 155.2 lb

## 2018-03-13 VITALS — HR 99

## 2018-03-13 DIAGNOSIS — I871 Compression of vein: Secondary | ICD-10-CM

## 2018-03-13 DIAGNOSIS — Z7901 Long term (current) use of anticoagulants: Secondary | ICD-10-CM

## 2018-03-13 DIAGNOSIS — C3492 Malignant neoplasm of unspecified part of left bronchus or lung: Secondary | ICD-10-CM

## 2018-03-13 DIAGNOSIS — R590 Localized enlarged lymph nodes: Secondary | ICD-10-CM

## 2018-03-13 DIAGNOSIS — R630 Anorexia: Secondary | ICD-10-CM

## 2018-03-13 DIAGNOSIS — G893 Neoplasm related pain (acute) (chronic): Secondary | ICD-10-CM

## 2018-03-13 DIAGNOSIS — D6869 Other thrombophilia: Secondary | ICD-10-CM | POA: Diagnosis not present

## 2018-03-13 DIAGNOSIS — R2232 Localized swelling, mass and lump, left upper limb: Secondary | ICD-10-CM

## 2018-03-13 DIAGNOSIS — Z7189 Other specified counseling: Secondary | ICD-10-CM

## 2018-03-13 DIAGNOSIS — R531 Weakness: Secondary | ICD-10-CM

## 2018-03-13 DIAGNOSIS — R600 Localized edema: Secondary | ICD-10-CM

## 2018-03-13 DIAGNOSIS — C3411 Malignant neoplasm of upper lobe, right bronchus or lung: Secondary | ICD-10-CM | POA: Diagnosis not present

## 2018-03-13 DIAGNOSIS — Z86718 Personal history of other venous thrombosis and embolism: Secondary | ICD-10-CM

## 2018-03-13 DIAGNOSIS — I739 Peripheral vascular disease, unspecified: Secondary | ICD-10-CM

## 2018-03-13 DIAGNOSIS — D630 Anemia in neoplastic disease: Secondary | ICD-10-CM

## 2018-03-13 LAB — CBC WITH DIFFERENTIAL/PLATELET
Basophils Absolute: 0.1 10*3/uL (ref 0.0–0.1)
Basophils Relative: 1 %
EOS ABS: 0 10*3/uL (ref 0.0–0.5)
Eosinophils Relative: 0 %
HCT: 31.2 % — ABNORMAL LOW (ref 38.4–49.9)
HEMOGLOBIN: 10.1 g/dL — AB (ref 13.0–17.1)
LYMPHS ABS: 0.4 10*3/uL — AB (ref 0.9–3.3)
Lymphocytes Relative: 3 %
MCH: 28.2 pg (ref 27.2–33.4)
MCHC: 32.4 g/dL (ref 32.0–36.0)
MCV: 86.9 fL (ref 79.3–98.0)
MONOS PCT: 4 %
Monocytes Absolute: 0.5 10*3/uL (ref 0.1–0.9)
NEUTROS ABS: 12.1 10*3/uL — AB (ref 1.5–6.5)
NEUTROS PCT: 92 %
Platelets: 292 10*3/uL (ref 140–400)
RBC: 3.59 MIL/uL — ABNORMAL LOW (ref 4.20–5.82)
RDW: 16.6 % — ABNORMAL HIGH (ref 11.0–14.6)
WBC: 13 10*3/uL — ABNORMAL HIGH (ref 4.0–10.3)

## 2018-03-13 LAB — CMP (CANCER CENTER ONLY)
ALT: 20 U/L (ref 0–44)
AST: 23 U/L (ref 15–41)
Albumin: 2.5 g/dL — ABNORMAL LOW (ref 3.5–5.0)
Alkaline Phosphatase: 114 U/L (ref 38–126)
Anion gap: 10 (ref 5–15)
BUN: 16 mg/dL (ref 6–20)
CHLORIDE: 100 mmol/L (ref 98–111)
CO2: 28 mmol/L (ref 22–32)
CREATININE: 0.63 mg/dL (ref 0.61–1.24)
Calcium: 9.8 mg/dL (ref 8.9–10.3)
GFR, Est AFR Am: >60 mL/min (ref >60)
Glucose, Bld: 90 mg/dL (ref 70–99)
Potassium: 4.7 mmol/L (ref 3.5–5.1)
Sodium: 138 mmol/L (ref 135–145)
Total Bilirubin: <0.2 mg/dL — ABNORMAL LOW (ref 0.3–1.2)
Total Protein: 7 g/dL (ref 6.5–8.1)

## 2018-03-13 LAB — MAGNESIUM: MAGNESIUM: 1.8 mg/dL (ref 1.7–2.4)

## 2018-03-13 MED ORDER — DIPHENHYDRAMINE HCL 50 MG/ML IJ SOLN
INTRAMUSCULAR | Status: AC
  Filled 2018-03-13: qty 1

## 2018-03-13 MED ORDER — DRONABINOL 5 MG PO CAPS: 5.0000 mg | ORAL_CAPSULE | Freq: Two times a day (BID) | ORAL | 0 refills | Status: AC

## 2018-03-13 MED ORDER — SODIUM CHLORIDE 0.9 % IV SOLN
45.0000 mg/m2 | Freq: Once | INTRAVENOUS | Status: AC
  Administered 2018-03-13: 90 mg via INTRAVENOUS
  Filled 2018-03-13: qty 15

## 2018-03-13 MED ORDER — SODIUM CHLORIDE 0.9 % IV SOLN
276.2000 mg | Freq: Once | INTRAVENOUS | Status: AC
  Administered 2018-03-13: 280 mg via INTRAVENOUS
  Filled 2018-03-13: qty 28

## 2018-03-13 MED ORDER — SODIUM CHLORIDE 0.9 % IV SOLN
20.0000 mg | Freq: Once | INTRAVENOUS | Status: AC
  Administered 2018-03-13: 20 mg via INTRAVENOUS
  Filled 2018-03-13: qty 2

## 2018-03-13 MED ORDER — FAMOTIDINE IN NACL 20-0.9 MG/50ML-% IV SOLN
INTRAVENOUS | Status: AC
  Filled 2018-03-13: qty 50

## 2018-03-13 MED ORDER — FAMOTIDINE IN NACL 20-0.9 MG/50ML-% IV SOLN
20.0000 mg | Freq: Once | INTRAVENOUS | Status: AC
  Administered 2018-03-13: 20 mg via INTRAVENOUS

## 2018-03-13 MED ORDER — DIPHENHYDRAMINE HCL 50 MG/ML IJ SOLN
50.0000 mg | Freq: Once | INTRAMUSCULAR | Status: AC
  Administered 2018-03-13: 50 mg via INTRAVENOUS

## 2018-03-13 MED ORDER — PALONOSETRON HCL INJECTION 0.25 MG/5ML
0.2500 mg | Freq: Once | INTRAVENOUS | Status: AC
  Administered 2018-03-13: 0.25 mg via INTRAVENOUS

## 2018-03-13 MED ORDER — PALONOSETRON HCL INJECTION 0.25 MG/5ML
INTRAVENOUS | Status: AC
  Filled 2018-03-13: qty 5

## 2018-03-13 MED ORDER — DEXAMETHASONE SODIUM PHOSPHATE 10 MG/ML IJ SOLN
INTRAMUSCULAR | Status: AC
  Filled 2018-03-13: qty 1

## 2018-03-13 MED ORDER — SODIUM CHLORIDE 0.9 % IV SOLN
Freq: Once | INTRAVENOUS | Status: AC
  Administered 2018-03-13: 11:00:00 via INTRAVENOUS
  Filled 2018-03-13: qty 250

## 2018-03-13 NOTE — Telephone Encounter (Signed)
Appts scheduled AVS/Calendar printed per 10/2 los °

## 2018-03-13 NOTE — Patient Instructions (Signed)
   [facility] Discharge Instructions for Patients Receiving Chemotherapy  Today you received the following chemotherapy agents Taxol and Carboplatin   To help prevent nausea and vomiting after your treatment, we encourage you to take your nausea medication as directed.    If you develop nausea and vomiting that is not controlled by your nausea medication, call the clinic.   BELOW ARE SYMPTOMS THAT SHOULD BE REPORTED IMMEDIATELY:  *FEVER GREATER THAN 100.5 F  *CHILLS WITH OR WITHOUT FEVER  NAUSEA AND VOMITING THAT IS NOT CONTROLLED WITH YOUR NAUSEA MEDICATION  *UNUSUAL SHORTNESS OF BREATH  *UNUSUAL BRUISING OR BLEEDING  TENDERNESS IN MOUTH AND THROAT WITH OR WITHOUT PRESENCE OF ULCERS  *URINARY PROBLEMS  *BOWEL PROBLEMS  UNUSUAL RASH Items with * indicate a potential emergency and should be followed up as soon as possible.  Feel free to call the clinic should you have any questions or concerns. The clinic phone number is [phone].  Please show the CHEMO ALERT CARD at check-in to the Emergency Department and triage nurse.   

## 2018-03-14 ENCOUNTER — Emergency Department (HOSPITAL_COMMUNITY): Payer: BLUE CROSS/BLUE SHIELD

## 2018-03-14 ENCOUNTER — Inpatient Hospital Stay (HOSPITAL_COMMUNITY)
Admission: EM | Admit: 2018-03-14 | Discharge: 2018-04-12 | DRG: 871 | Disposition: E | Payer: BLUE CROSS/BLUE SHIELD | Attending: Family Medicine | Admitting: Family Medicine

## 2018-03-14 ENCOUNTER — Other Ambulatory Visit (HOSPITAL_COMMUNITY): Payer: Self-pay

## 2018-03-14 ENCOUNTER — Encounter (HOSPITAL_COMMUNITY): Payer: Self-pay

## 2018-03-14 ENCOUNTER — Other Ambulatory Visit: Payer: Self-pay

## 2018-03-14 DIAGNOSIS — Z79899 Other long term (current) drug therapy: Secondary | ICD-10-CM

## 2018-03-14 DIAGNOSIS — D6489 Other specified anemias: Secondary | ICD-10-CM | POA: Diagnosis present

## 2018-03-14 DIAGNOSIS — Z515 Encounter for palliative care: Secondary | ICD-10-CM | POA: Diagnosis present

## 2018-03-14 DIAGNOSIS — Z86718 Personal history of other venous thrombosis and embolism: Secondary | ICD-10-CM | POA: Diagnosis not present

## 2018-03-14 DIAGNOSIS — R590 Localized enlarged lymph nodes: Secondary | ICD-10-CM

## 2018-03-14 DIAGNOSIS — K56609 Unspecified intestinal obstruction, unspecified as to partial versus complete obstruction: Secondary | ICD-10-CM | POA: Diagnosis present

## 2018-03-14 DIAGNOSIS — J9601 Acute respiratory failure with hypoxia: Secondary | ICD-10-CM | POA: Diagnosis present

## 2018-03-14 DIAGNOSIS — R100 Acute abdomen: Secondary | ICD-10-CM | POA: Diagnosis not present

## 2018-03-14 DIAGNOSIS — Z7952 Long term (current) use of systemic steroids: Secondary | ICD-10-CM | POA: Diagnosis not present

## 2018-03-14 DIAGNOSIS — Z0189 Encounter for other specified special examinations: Secondary | ICD-10-CM

## 2018-03-14 DIAGNOSIS — I739 Peripheral vascular disease, unspecified: Secondary | ICD-10-CM | POA: Diagnosis present

## 2018-03-14 DIAGNOSIS — Z87891 Personal history of nicotine dependence: Secondary | ICD-10-CM

## 2018-03-14 DIAGNOSIS — M79604 Pain in right leg: Secondary | ICD-10-CM | POA: Diagnosis present

## 2018-03-14 DIAGNOSIS — G893 Neoplasm related pain (acute) (chronic): Secondary | ICD-10-CM | POA: Diagnosis present

## 2018-03-14 DIAGNOSIS — Z7189 Other specified counseling: Secondary | ICD-10-CM

## 2018-03-14 DIAGNOSIS — C7989 Secondary malignant neoplasm of other specified sites: Secondary | ICD-10-CM | POA: Diagnosis present

## 2018-03-14 DIAGNOSIS — Z7901 Long term (current) use of anticoagulants: Secondary | ICD-10-CM | POA: Diagnosis not present

## 2018-03-14 DIAGNOSIS — I871 Compression of vein: Secondary | ICD-10-CM | POA: Diagnosis present

## 2018-03-14 DIAGNOSIS — Z6821 Body mass index (BMI) 21.0-21.9, adult: Secondary | ICD-10-CM

## 2018-03-14 DIAGNOSIS — R188 Other ascites: Secondary | ICD-10-CM

## 2018-03-14 DIAGNOSIS — Z66 Do not resuscitate: Secondary | ICD-10-CM | POA: Diagnosis present

## 2018-03-14 DIAGNOSIS — A419 Sepsis, unspecified organism: Principal | ICD-10-CM | POA: Diagnosis present

## 2018-03-14 DIAGNOSIS — Z923 Personal history of irradiation: Secondary | ICD-10-CM

## 2018-03-14 DIAGNOSIS — C773 Secondary and unspecified malignant neoplasm of axilla and upper limb lymph nodes: Secondary | ICD-10-CM | POA: Diagnosis present

## 2018-03-14 DIAGNOSIS — I10 Essential (primary) hypertension: Secondary | ICD-10-CM | POA: Diagnosis present

## 2018-03-14 DIAGNOSIS — F17200 Nicotine dependence, unspecified, uncomplicated: Secondary | ICD-10-CM | POA: Diagnosis present

## 2018-03-14 DIAGNOSIS — I82621 Acute embolism and thrombosis of deep veins of right upper extremity: Secondary | ICD-10-CM | POA: Diagnosis not present

## 2018-03-14 DIAGNOSIS — E440 Moderate protein-calorie malnutrition: Secondary | ICD-10-CM

## 2018-03-14 DIAGNOSIS — Z9221 Personal history of antineoplastic chemotherapy: Secondary | ICD-10-CM

## 2018-03-14 DIAGNOSIS — F418 Other specified anxiety disorders: Secondary | ICD-10-CM | POA: Diagnosis present

## 2018-03-14 DIAGNOSIS — R6520 Severe sepsis without septic shock: Secondary | ICD-10-CM | POA: Diagnosis present

## 2018-03-14 DIAGNOSIS — I998 Other disorder of circulatory system: Secondary | ICD-10-CM | POA: Diagnosis present

## 2018-03-14 DIAGNOSIS — R739 Hyperglycemia, unspecified: Secondary | ICD-10-CM | POA: Diagnosis present

## 2018-03-14 DIAGNOSIS — Z95828 Presence of other vascular implants and grafts: Secondary | ICD-10-CM

## 2018-03-14 DIAGNOSIS — G9341 Metabolic encephalopathy: Secondary | ICD-10-CM | POA: Diagnosis present

## 2018-03-14 DIAGNOSIS — I82409 Acute embolism and thrombosis of unspecified deep veins of unspecified lower extremity: Secondary | ICD-10-CM | POA: Diagnosis present

## 2018-03-14 DIAGNOSIS — C3411 Malignant neoplasm of upper lobe, right bronchus or lung: Secondary | ICD-10-CM | POA: Diagnosis present

## 2018-03-14 LAB — COMPREHENSIVE METABOLIC PANEL
ALK PHOS: 128 U/L — AB (ref 38–126)
ALT: 20 U/L (ref 0–44)
ANION GAP: 20 — AB (ref 5–15)
AST: 24 U/L (ref 15–41)
Albumin: 2.5 g/dL — ABNORMAL LOW (ref 3.5–5.0)
BUN: 28 mg/dL — ABNORMAL HIGH (ref 6–20)
CO2: 24 mmol/L (ref 22–32)
Calcium: 9.4 mg/dL (ref 8.9–10.3)
Chloride: 100 mmol/L (ref 98–111)
Creatinine, Ser: 1.01 mg/dL (ref 0.61–1.24)
GLUCOSE: 204 mg/dL — AB (ref 70–99)
Potassium: 4.5 mmol/L (ref 3.5–5.1)
Sodium: 144 mmol/L (ref 135–145)
TOTAL PROTEIN: 7 g/dL (ref 6.5–8.1)
Total Bilirubin: 0.5 mg/dL (ref 0.3–1.2)

## 2018-03-14 LAB — I-STAT CG4 LACTIC ACID, ED: Lactic Acid, Venous: 5.91 mmol/L (ref 0.5–1.9)

## 2018-03-14 LAB — I-STAT CHEM 8, ED
BUN: 26 mg/dL — ABNORMAL HIGH (ref 6–20)
CALCIUM ION: 1.1 mmol/L — AB (ref 1.15–1.40)
CHLORIDE: 98 mmol/L (ref 98–111)
Creatinine, Ser: 0.8 mg/dL (ref 0.61–1.24)
Glucose, Bld: 208 mg/dL — ABNORMAL HIGH (ref 70–99)
HCT: 47 % (ref 39.0–52.0)
HEMOGLOBIN: 16 g/dL (ref 13.0–17.0)
Potassium: 4.3 mmol/L (ref 3.5–5.1)
Sodium: 137 mmol/L (ref 135–145)
TCO2: 26 mmol/L (ref 22–32)

## 2018-03-14 LAB — CBC WITH DIFFERENTIAL/PLATELET
Basophils Absolute: 0 10*3/uL (ref 0.0–0.1)
Basophils Relative: 0 %
Eosinophils Absolute: 0 10*3/uL (ref 0.0–0.7)
Eosinophils Relative: 0 %
HCT: 44.9 % (ref 39.0–52.0)
HEMOGLOBIN: 14.4 g/dL (ref 13.0–17.0)
Lymphocytes Relative: 6 %
Lymphs Abs: 0.7 10*3/uL (ref 0.7–4.0)
MCH: 28.7 pg (ref 26.0–34.0)
MCHC: 32.1 g/dL (ref 30.0–36.0)
MCV: 89.6 fL (ref 78.0–100.0)
MONO ABS: 0.4 10*3/uL (ref 0.1–1.0)
Monocytes Relative: 3 %
NEUTROS ABS: 11 10*3/uL (ref 1.7–7.7)
NEUTROS PCT: 91 %
Platelets: 353 10*3/uL (ref 150–400)
RBC: 5.01 MIL/uL (ref 4.22–5.81)
RDW: 17.1 % — AB (ref 11.5–15.5)
WBC: 12.1 10*3/uL — ABNORMAL HIGH (ref 4.0–10.5)

## 2018-03-14 LAB — LIPASE, BLOOD: LIPASE: 21 U/L (ref 11–51)

## 2018-03-14 LAB — TSH: TSH: 3.712 u[IU]/mL (ref 0.350–4.500)

## 2018-03-14 LAB — PREALBUMIN: PREALBUMIN: 26.4 mg/dL (ref 18–38)

## 2018-03-14 MED ORDER — PHENOL 1.4 % MT LIQD: 1.0000 | OROMUCOSAL | Status: DC | PRN

## 2018-03-14 MED ORDER — IOPAMIDOL (ISOVUE-300) INJECTION 61%
INTRAVENOUS | Status: AC
  Administered 2018-03-15: 07:00:00
  Filled 2018-03-14: qty 100

## 2018-03-14 MED ORDER — LACTATED RINGERS IV BOLUS (SEPSIS)
1000.0000 mL | Freq: Once | INTRAVENOUS | Status: AC
  Administered 2018-03-14: 1000 mL via INTRAVENOUS

## 2018-03-14 MED ORDER — DIATRIZOATE MEGLUMINE & SODIUM 66-10 % PO SOLN
90.0000 mL | Freq: Once | ORAL | Status: DC
  Filled 2018-03-14: qty 90

## 2018-03-14 MED ORDER — SODIUM CHLORIDE 0.9 % IV SOLN
2.0000 g | Freq: Once | INTRAVENOUS | Status: AC
  Administered 2018-03-14: 2 g via INTRAVENOUS
  Filled 2018-03-14: qty 2

## 2018-03-14 MED ORDER — ONDANSETRON HCL 4 MG/2ML IJ SOLN
4.0000 mg | Freq: Once | INTRAMUSCULAR | Status: AC
  Administered 2018-03-14: 4 mg via INTRAVENOUS
  Filled 2018-03-14: qty 2

## 2018-03-14 MED ORDER — GUAIFENESIN-DM 100-10 MG/5ML PO SYRP: 10.0000 mL | ORAL_SOLUTION | ORAL | Status: DC | PRN

## 2018-03-14 MED ORDER — LACTATED RINGERS IV BOLUS
1000.0000 mL | Freq: Once | INTRAVENOUS | Status: AC
  Administered 2018-03-15: 1000 mL via INTRAVENOUS

## 2018-03-14 MED ORDER — HYDROCORTISONE 1 % EX CREA: 1.0000 "application " | TOPICAL_CREAM | Freq: Three times a day (TID) | CUTANEOUS | Status: DC | PRN

## 2018-03-14 MED ORDER — PROCHLORPERAZINE EDISYLATE 10 MG/2ML IJ SOLN: 5.0000 mg | INTRAMUSCULAR | Status: DC | PRN

## 2018-03-14 MED ORDER — LIP MEDEX EX OINT
1.0000 "application " | TOPICAL_OINTMENT | Freq: Two times a day (BID) | CUTANEOUS | Status: DC
  Administered 2018-03-15 – 2018-03-21 (×13): 1 via TOPICAL
  Filled 2018-03-14 (×2): qty 7

## 2018-03-14 MED ORDER — HYDROCORTISONE 2.5 % RE CREA: 1.0000 "application " | TOPICAL_CREAM | Freq: Four times a day (QID) | RECTAL | Status: DC | PRN

## 2018-03-14 MED ORDER — ALUM & MAG HYDROXIDE-SIMETH 200-200-20 MG/5ML PO SUSP: 30.0000 mL | Freq: Four times a day (QID) | ORAL | Status: DC | PRN

## 2018-03-14 MED ORDER — LACTATED RINGERS IV BOLUS
1000.0000 mL | Freq: Three times a day (TID) | INTRAVENOUS | Status: DC | PRN
  Administered 2018-03-15: 1000 mL via INTRAVENOUS

## 2018-03-14 MED ORDER — METRONIDAZOLE IN NACL 5-0.79 MG/ML-% IV SOLN
500.0000 mg | Freq: Three times a day (TID) | INTRAVENOUS | Status: DC
  Administered 2018-03-14: 500 mg via INTRAVENOUS
  Filled 2018-03-14 (×2): qty 100

## 2018-03-14 MED ORDER — MENTHOL 3 MG MT LOZG: 1.0000 | LOZENGE | OROMUCOSAL | Status: DC | PRN

## 2018-03-14 MED ORDER — MAGIC MOUTHWASH
15.0000 mL | Freq: Four times a day (QID) | ORAL | Status: DC | PRN
  Administered 2018-03-17: 15 mL via ORAL
  Filled 2018-03-14 (×2): qty 15

## 2018-03-14 MED ORDER — FENTANYL CITRATE (PF) 100 MCG/2ML IJ SOLN
100.0000 ug | Freq: Once | INTRAMUSCULAR | Status: AC
  Administered 2018-03-14: 100 ug via INTRAVENOUS
  Filled 2018-03-14: qty 2

## 2018-03-14 MED ORDER — SODIUM CHLORIDE 0.9 % IJ SOLN
INTRAMUSCULAR | Status: AC
  Administered 2018-03-15: 07:00:00
  Filled 2018-03-14: qty 50

## 2018-03-14 MED ORDER — HYDROMORPHONE HCL 1 MG/ML IJ SOLN
0.5000 mg | Freq: Once | INTRAMUSCULAR | Status: AC
  Administered 2018-03-14: 0.5 mg via INTRAVENOUS
  Filled 2018-03-14: qty 1

## 2018-03-14 MED ORDER — BISACODYL 10 MG RE SUPP
10.0000 mg | Freq: Every day | RECTAL | Status: DC
  Administered 2018-03-15 – 2018-03-17 (×3): 10 mg via RECTAL
  Filled 2018-03-14 (×4): qty 1

## 2018-03-14 MED ORDER — VANCOMYCIN HCL IN DEXTROSE 1-5 GM/200ML-% IV SOLN
1000.0000 mg | Freq: Once | INTRAVENOUS | Status: AC
  Administered 2018-03-14: 1000 mg via INTRAVENOUS
  Filled 2018-03-14: qty 200

## 2018-03-14 MED ORDER — LIDOCAINE HCL URETHRAL/MUCOSAL 2 % EX GEL
1.0000 "application " | Freq: Once | CUTANEOUS | Status: AC
  Administered 2018-03-14: 1
  Filled 2018-03-14: qty 5

## 2018-03-14 MED ORDER — IOPAMIDOL (ISOVUE-300) INJECTION 61%
100.0000 mL | Freq: Once | INTRAVENOUS | Status: AC | PRN
  Administered 2018-03-14: 100 mL via INTRAVENOUS

## 2018-03-14 MED ORDER — ACETAMINOPHEN 650 MG RE SUPP
650.0000 mg | Freq: Four times a day (QID) | RECTAL | Status: DC | PRN
  Administered 2018-03-18: 650 mg via RECTAL
  Filled 2018-03-14: qty 1

## 2018-03-14 MED ORDER — ONDANSETRON HCL 4 MG/2ML IJ SOLN: 4.0000 mg | Freq: Four times a day (QID) | INTRAMUSCULAR | Status: DC | PRN

## 2018-03-14 MED ORDER — LACTATED RINGERS IV BOLUS (SEPSIS)
250.0000 mL | Freq: Once | INTRAVENOUS | Status: AC
  Administered 2018-03-14: 250 mL via INTRAVENOUS

## 2018-03-14 MED ORDER — SODIUM CHLORIDE 0.9 % IV SOLN
8.0000 mg | Freq: Four times a day (QID) | INTRAVENOUS | Status: DC | PRN
  Filled 2018-03-14: qty 4

## 2018-03-14 MED ORDER — METHOCARBAMOL 1000 MG/10ML IJ SOLN
1000.0000 mg | Freq: Four times a day (QID) | INTRAVENOUS | Status: DC | PRN
  Filled 2018-03-14: qty 10

## 2018-03-14 NOTE — ED Triage Notes (Signed)
PT to ed by [facility] with complaints of Abdomen Pain. Pt has hx of stage 4 lung cancer and had a chemo treatment this week. Pt denies N/V/D but has been coughing.

## 2018-03-14 NOTE — ED Notes (Signed)
Dr. Tyrone Nine wanted to wait to see Neutrophils before getting a rectal tempt

## 2018-03-14 NOTE — ED Notes (Signed)
Bed: ME26 Expected date:  Expected time:  Means of arrival:  Comments: 52 yr old male abdominal pain

## 2018-03-14 NOTE — Progress Notes (Signed)
A consult was received from an ED physician for Vancomycin & Cefepime per pharmacy dosing.  The patient's profile has been reviewed for ht/wt/allergies/indication/available labs.   A one time order has been placed for Vancomycin 1gm & Cefepime 2gm.  Further antibiotics/pharmacy consults should be ordered by admitting physician if indicated.                       Thank you,  Minda Ditto 04/01/2018  9:08 PM

## 2018-03-14 NOTE — ED Notes (Signed)
Pt refused NG tube

## 2018-03-14 NOTE — Consult Note (Addendum)
Tony [name] Tony [name]  1965/06/15 [id number]  CARE TEAM:  PCP: Tony Amel, MD  Outpatient Care Team: Patient Care Team: Tony Amel, MD as PCP - General (Family Medicine) Tony Genera, MD as Medical Oncologist (Hematology)  Inpatient Treatment Team: Treatment Team: Attending Provider: Deno Etienne, DO; Technician: Tony [name]; Registered Nurse: Tony Garfinkel, RN; Consulting Physician: Tony Nations, MD; Consulting Physician: Tony Genera, MD   This patient is a 52 y.o.male who presents today for surgical evaluation at the request of Dr Tony [name].   Chief complaint / Reason for evaluation: Abdominal pain and bowel obstruction  52 year old male with right-sided lung cancer with significant lymphadenopathy to mediastinum, left axilla, neck.  Undergoing palliative chemotherapy as well as focused radiation treatment.  Followed by palliative care and medical oncology.  History of significant peripheral vascular disease in order iliac occlusive disease requiring emergent vascular surgery this past summer with bypasses and thrombectomies.  History of DVT on Fondaparinux.  Near SVC syndrome.    Patient with decreased appetite and abdominal pain and discomfort since yesterday last night.  Progressively worsened.  Wife concerned.  She brought him to the emergency room.  Abdominal discomfort and tachycardia.  CT scan confirms dilated small bowel loops with some edema and ascites.  No perforation or pneumatosis.  No ischemia.  Bowel obstruction suspected.  Surgical and medical consultations requested.  Patient has received several boluses of IV fluids.  He has not been able to take his pain medications or other medications all day today.    Assessment  Tony [name]  52 y.o. male       Problem List:  Principal Problem:   SBO (small bowel obstruction) (HCC) Active Problems:   Malignant neoplasm of upper lobe of right lung (HCC)   Mediastinal adenopathy with near SVC syndrome  Cancer-related pain   SMOKER   Depression with anxiety   Essential hypertension   PVD (peripheral vascular disease) (HCC)   Pain of right lower extremity due to ischemia   S/P aortobifemoral bypass surgery   Cervical lymphadenopathy   DVT (deep venous thrombosis) (HCC)   SVC syndrome   Malnutrition of moderate degree   On antineoplastic chemotherapy   Ascites   Protein-calorie malnutrition, moderate (HCC)   Hyperglycemia   Metastatic lung cancer to axillary lymph nodes    Small bowel obstruction the setting of metastatic lung cancer with aggressive pattern and rapid progression on palliative chemotherapy  Now with abdominal pain and probable bowel obstruction.  No definite perforation or gangrene uncomfortable presence of ascites concerning for moderate to severe malnutrition and perhaps peritoneal disease as well.  Plan:  Admit  IV fluids.  Nasogastric tube and small bowel protocol.  He refused NG tube.   The [name] would be to decompress the bowel and provide some relief, but he immediately started pulling out the NG tube when I tried to place it in the right nare that seemed to pass down to the throat.  He states that he cannot tolerate trying again.  I am pretty sure he would pull it out quickly anyway  Palliate nausea and pain.  With hypertension and tachycardia, that argues that pain and anxiety are definitely factors right now.  Most likely a combination of abdominal pain from bowel obstruction, metastatic cancer, and withdrawal from chronic pain medications needed to help cope with his severe disease.  Prognosis rather guarded at this point.  He would benefit from some medical stabilization before considering surgical intervention.  I would definitely want input from medical oncology and palliative care about goals of care.  His operative risks are markedly increased with progressive lung cancer on palliative chemotherapy, anticoagulated, with at least moderate malnutrition.  Do  not want to put this gentleman through another operation if not needed, but refusing NG tube as well.  While he does seem to have some abdominal discomfort, it is not reproduced with bed shake or cough.  That argues against peritonitis or abdominal guarding.  He does not have any hard indications of perforation, ischemia, pneumatosis, etc. that make me want to take him to the operating room right now (they dont want that now anyway); however, he may require surgical intervention if he does not rally.  Patient & wife hesitant to consider surgery right now.  I did warn them that if he does not improve, he will need surgery to rule out more significant bowel disease tomorrow.  We will follow but I am guarded against the risks of surgery in a sickly person with progressive metastatic lung cancer despite chemotherapy.  Concern for progressive SVC syndrome.  Chronically anticoagulated for DVTs.  Fondaparinux daily.  Hold tonight in the event surgery needed in the morning.  If they decline or no strong indication, restart IV drip soon.  D/w Dr Tony [name], [facility]  -VTE prophylaxis- SCDs, etc -mobilize as tolerated to help recovery -Prognosis guarded.  Wife notes the patient is willing to be intubated if needed.  I think some goals of care should be re-discussed.  45 minutes spent in review, evaluation, examination, counseling, and coordination of care.  More than 50% of that time was spent in counseling.  Tony Hector, MD, FACS, MASCRS Gastrointestinal and Minimally Invasive Surgery    [address], [place], [place] [address] [phone] Main / Paging [phone] Fax   04/10/2018      Past Medical History:  Diagnosis Date  . Anxiety   . Arterial occlusive disease    multilevel arterial occlusive disease/notes 11/13/2017  . Arthritis    "left knee" (11/13/2017)  . Cancer ([place])    stage 4 lung cancer  . Chronic back pain   . Chronic lower back pain   .  Depression   . Gastric ulcer due to Helicobacter pylori 4562  . GERD (gastroesophageal reflux disease)   . High cholesterol   . History of blood transfusion   . Hypertension   . IBS (irritable bowel syndrome)   . Mesenteric adenitis 2011   presumed/H&P  . Migraines    "use to suffer migraines years ago" (11/13/2017)  . Peripheral vascular disease ([place])   . Pneumonia 2011   "wife states he didn't have pneumonia"  . Ulcer     Past Surgical History:  Procedure Laterality Date  . AORTA - BILATERAL FEMORAL ARTERY BYPASS GRAFT Bilateral 11/15/2017   Procedure: AORTA BIFEMORAL BYPASS GRAFT;  Surgeon: Angelia Mould, MD;  Location: [place];  Service: Vascular;  Laterality: Bilateral;  . BACK SURGERY  X 4   "procedure where they burn the nerves every 6 months"  . EMBOLECTOMY Left 12/01/2017   Procedure: THROMBECTOMY OF LEFT AORTAFEMORAL BYPASS, THROMBECTOMY OF TIBIAL ARTERY;  Surgeon: Rosetta Posner, MD;  Location: [facility] OR;  Service: Vascular;  Laterality: Left;  . ESOPHAGOGASTRODUODENOSCOPY  08/22/2011   Procedure: ESOPHAGOGASTRODUODENOSCOPY (EGD);  Surgeon: [name], MD;  Location: [place];  Service: Gastroenterology;  Laterality: N/A;  . FEMORAL-POPLITEAL BYPASS GRAFT Right 11/15/2017  Procedure: Right FEMORAL-Above knee POPLITEAL ARTERY Bypass Graft using nonreversed saphenous vein ;  Surgeon: Angelia Mould, MD;  Location: [place];  Service: Vascular;  Laterality: Right;  . FEMORAL-POPLITEAL BYPASS GRAFT Left 12/01/2017   Procedure: BYPASS GRAFT FEMORAL- ABOVE KNEE POPLITEAL ARTERY WITH VEIN;  Surgeon: Rosetta Posner, MD;  Location: [place];  Service: Vascular;  Laterality: Left;  . INTRAOPERATIVE ARTERIOGRAM Right 11/15/2017   Procedure: INTRA OPERATIVE ARTERIOGRAM;  Surgeon: Angelia Mould, MD;  Location: [place];  Service: Vascular;  Laterality: Right;  . INTRAOPERATIVE ARTERIOGRAM Left 12/01/2017   Procedure: INTRA OPERATIVE ARTERIOGRAM OF LEFT LEG;  Surgeon: Rosetta Posner, MD;  Location: [place];  Service: Vascular;  Laterality: Left;  . IR US GUIDE BX ASP/DRAIN  12/18/2017  . KNEE ARTHROSCOPY Left X 5    Social History   Socioeconomic History  . Marital status: Married    Spouse name: Not on file  . Number of children: 3  . Years of education: Not on file  . Highest education level: Not on file  Occupational History  . Occupation: Admin. Assistant    Employer: [employer]  Social Needs  . Financial resource strain: Not on file  . Food insecurity:    Worry: Not on file    Inability: Not on file  . Transportation needs:    Medical: Not on file    Non-medical: Not on file  Tobacco Use  . Smoking status: Former Smoker    Packs/day: 1.00    Years: 24.00    Pack years: 24.00    Types: Cigarettes    Start date: 11/28/2017    Last attempt to quit: 12/04/2017    Years since quitting: 0.2  . Smokeless tobacco: Never Used  Substance and Sexual Activity  . Alcohol use: Not Currently  . Drug use: Not Currently    Types: Marijuana    Comment: 11/13/2017  "last drug use was in ~ 1987 "  . Sexual activity: Not on file  Lifestyle  . Physical activity:    Days per week: Not on file    Minutes per session: Not on file  . Stress: Not on file  Relationships  . Social connections:    Talks on phone: Not on file    Gets together: Not on file    Attends religious service: Not on file    Active member of club or organization: Not on file    Attends meetings of clubs or organizations: Not on file    Relationship status: Not on file  . Intimate partner violence:    Fear of current or ex partner: Not on file    Emotionally abused: Not on file    Physically abused: Not on file    Forced sexual activity: Not on file  Other Topics Concern  . Not on file  Social History Narrative   Lives with wife and two children.  Administrator at [employer] History  Problem Relation Age of Onset  . Hypertension Mother   . Heart disease Mother   . Hypertension  Father   . Coronary artery disease Father 10  . Heart attack Brother 55  . Coronary artery disease Brother   . Heart disease Brother   . Diabetes Neg Hx   . Stroke Neg Hx     Current Facility-Administered Medications  Medication Dose Route Frequency Provider Last Rate Last Dose  . acetaminophen (TYLENOL) suppository 650 mg  650 mg Rectal Q6H  PRN Michael Boston, MD      . alum & mag hydroxide-simeth (MAALOX/MYLANTA) 200-200-20 MG/5ML suspension 30 mL  30 mL Oral Q6H PRN Michael Boston, MD      . bisacodyl (DULCOLAX) suppository 10 mg  10 mg Rectal Daily Michael Boston, MD      . diatrizoate meglumine-sodium (GASTROGRAFIN) 66-10 % solution 90 mL  90 mL Per NG tube Once Michael Boston, MD      . fentaNYL (SUBLIMAZE) injection 100 mcg  100 mcg Intravenous Once Tony [name], Dan, DO      . guaiFENesin-dextromethorphan (ROBITUSSIN DM) 100-10 MG/5ML syrup 10 mL  10 mL Oral Q4H PRN Michael Boston, MD      . hydrocortisone (ANUSOL-HC) 2.5 % rectal cream 1 application  1 application Topical QID PRN Michael Boston, MD      . hydrocortisone cream 1 % 1 application  1 application Topical TID PRN Michael Boston, MD      . iopamidol (ISOVUE-300) 61 % injection           . lactated ringers bolus 1,000 mL  1,000 mL Intravenous Once Tony Etienne, DO      . lactated ringers bolus 1,000 mL  1,000 mL Intravenous TID PRN Michael Boston, MD      . lactated ringers bolus 1,000 mL  1,000 mL Intravenous Once Michael Boston, MD      . lidocaine (XYLOCAINE) 2 % jelly 1 application  1 application Other Once Tony [name], Dan, DO      . lip balm (CARMEX) ointment 1 application  1 application Topical BID Michael Boston, MD      . magic mouthwash  15 mL Oral QID PRN Michael Boston, MD      . menthol-cetylpyridinium (CEPACOL) lozenge 3 mg  1 lozenge Oral PRN Michael Boston, MD      . methocarbamol (ROBAXIN) 1,000 mg in dextrose 5 % 50 mL IVPB  1,000 mg Intravenous Q6H PRN Michael Boston, MD      . metroNIDAZOLE (FLAGYL) IVPB 500 mg  500 mg  Intravenous Q8H Floyd, Dan, DO 100 mL/hr at 04/06/2018 2215 500 mg at 04/09/2018 2215  . ondansetron (ZOFRAN) injection 4 mg  4 mg Intravenous Q6H PRN Michael Boston, MD       Or  . ondansetron (ZOFRAN) 8 mg in sodium chloride 0.9 % 50 mL IVPB  8 mg Intravenous Q6H PRN Michael Boston, MD      . phenol (CHLORASEPTIC) mouth spray 1-2 spray  1-2 spray Mouth/Throat PRN Michael Boston, MD      . prochlorperazine (COMPAZINE) injection 5-10 mg  5-10 mg Intravenous Q4H PRN Michael Boston, MD      . sodium chloride 0.9 % injection           . vancomycin (VANCOCIN) IVPB 1000 mg/200 mL premix  1,000 mg Intravenous Once Tony Etienne, DO 200 mL/hr at 03/25/2018 2237 1,000 mg at 03/17/2018 2237   Current Outpatient Medications  Medication Sig Dispense Refill  . albuterol (PROVENTIL) (2.5 MG/3ML) 0.083% nebulizer solution Take 3 mLs (2.5 mg total) by nebulization every 4 (four) hours as needed for wheezing or shortness of breath. 75 mL 0  . amLODipine (NORVASC) 5 MG tablet Take 1 tablet (5 mg total) by mouth daily. 30 tablet 0  . chlorhexidine gluconate, MEDLINE KIT, (PERIDEX) 0.12 % solution 15 mLs by Mouth Rinse route 2 (two) times daily. 120 mL 0  . dexamethasone (DECADRON) 4 MG tablet Take 1 tablet (4 mg total) by mouth daily. 60 tablet  3  . dronabinol (MARINOL) 5 MG capsule Take 1 capsule (5 mg total) by mouth 2 (two) times daily before lunch and supper. 60 capsule 0  . DULoxetine (CYMBALTA) 60 MG capsule Take 1 capsule (60 mg total) by mouth daily. 30 capsule 0  . ferrous sulfate 325 (65 FE) MG tablet Take 1 tablet (325 mg total) by mouth daily with breakfast. 30 tablet 0  . fluconazole (DIFLUCAN) 100 MG tablet Take 1 tablet (100 mg total) by mouth daily. 30 tablet 0  . fondaparinux (ARIXTRA) 7.5 MG/0.6ML SOLN injection Inject 0.6 mLs (7.5 mg total) into the skin daily at 6 PM for 26 days. 15.6 mL 0  . gabapentin (NEURONTIN) 400 MG capsule Take 1 capsule (400 mg total) by mouth 2 (two) times daily. 60 capsule 3  .  HYDROmorphone (DILAUDID) 8 MG tablet Take 1 tablet (8 mg total) by mouth every 2 (two) hours as needed for severe pain. 20 tablet 0  . LORazepam (ATIVAN) 1 MG tablet Take 0.5 tablets (0.5 mg total) by mouth 2 (two) times daily AND 1 tablet (1 mg total) at bedtime. (Patient taking differently: 1 mg at bedtime only) 60 tablet 3  . magic mouthwash SOLN Take 5-10 mLs by mouth 2 (two) times daily.  3  . methadone (DOLOPHINE) 10 MG tablet Take 2 tablets (20 mg total) by mouth every 8 (eight) hours. 180 tablet 0  . methocarbamol (ROBAXIN) 500 MG tablet Take 1 tablet (500 mg total) by mouth 2 (two) times daily. 20 tablet 0  . Multiple Vitamin (MULTIVITAMIN WITH MINERALS) TABS tablet Take 1 tablet by mouth daily. 30 tablet 0  . NARCAN 4 MG/0.1ML LIQD nasal spray kit Place 1 spray into the nose daily as needed. Overdose of medication  0  . OLANZapine (ZYPREXA) 5 MG tablet Take 1 tablet (5 mg total) by mouth at bedtime. 30 tablet 0  . ondansetron (ZOFRAN) 8 MG tablet Take 1 tablet (8 mg total) by mouth 2 (two) times daily as needed for refractory nausea / vomiting. Start on day 3 after chemo. 30 tablet 1  . pantoprazole (PROTONIX) 40 MG tablet Take 1 tablet (40 mg total) by mouth daily. 30 tablet 3  . polyethylene glycol (MIRALAX / GLYCOLAX) packet Take 17 g by mouth daily. 14 each 0  . povidone-iodine (BETADINE) 10 % external solution Apply topically as needed for wound care. (Patient taking differently: Apply 1 application topically 3 (three) times daily as needed for wound care. ) 480 mL 0  . senna-docusate (SENOKOT-S) 8.6-50 MG tablet Take 2 tablets by mouth 2 (two) times daily. 30 tablet 0  . sulfamethoxazole-trimethoprim (BACTRIM DS,SEPTRA DS) 800-160 MG tablet Take 1 tablet by mouth daily. 14 tablet 0  . triamcinolone cream (KENALOG) 0.1 % Apply topically 2 (two) times daily. 30 g 0     Allergies  Allergen Reactions  . Pork-Derived Products Other (See Comments)    UNSPECIFIED REASON - Pt doesn't eat  pork  . Shellfish-Derived Products Other (See Comments)    Doesn't want to eat  . Morphine And Related Other (See Comments)    Hallucinations    ROS:   All other systems reviewed & are negative except per HPI or as noted below: Constitutional:  No fevers, chills, sweats.  Weight down Eyes:  No vision changes, No discharge HENT:  No sore throats, nasal drainage Lymph: ++neck swelling, + bruising easily Pulmonary:  Mild cough, productive sputum CV: No orthopnea, PND   GI: No personal nor  family history of GI/colon cancer, inflammatory bowel disease, irritable bowel syndrome, allergy such as Celiac Sprue, dietary/dairy problems, colitis, ulcers nor gastritis.  No recent sick contacts/gastroenteritis.  No travel outside the country.  No changes in diet. Renal: No UTIs, No hematuria Genital:  No drainage, bleeding, masses Musculoskeletal: No severe joint pain.  Rather severe neck and some back pain good ROM major joints Skin:  No sores or lesions.  No rashes Heme/Lymph:  No easy bleeding.  Very swollen lymph nodes, especially in left armpit  Neuro: No focal weakness/numbness.  No seizures Psych: No suicidal ideation.  No hallucinations  BP (!) 143/97   Pulse (!) 149   Temp 98.4 F (36.9 C) (Oral)   Resp (!) 24   Ht 6' (1.829 m)   Wt 72.6 kg   SpO2 93%   BMI 21.71 kg/m   Physical Exam: General: Pt awake/alert/oriented x4 in moderate acute distress.  Restless.  Cannot stay still. Eyes: PERRL, normal EOM. Sclera nonicteric Neuro: CN II-XII intact w/o focal sensory/motor deficits. Lymph: Cervical and especially left axillary lymphadenopathy.   Psych:  +Delerium.  No psychosis/paranoia HENT: Normocephalic, Mucus membranes moist.  No thrush Neck: Neck rather swollen with woody edema.  Hyperpigmented and congested skin changes in neck and upper chest.   Chest: No pain.  Gets tachypnea when restless but then calms down on his breathing rate . CV:  Pulses intact.  Tachycardic.  Heart  rate 130s to 150s. Abdomen: Soft, Mildy distended.  Some tenderness to palpation on right side more than left.  However no definite reproduction with cough or bed shake.  Midline incision without any hernias.  Gen:  No inguinal hernias.  No inguinal lymphadenopathy.   Ext:  SCDs BLE.  No significant edema.  No cyanosis Skin: As noted on neck.  Hyperpigmented congested neck and chest.  Woody edema.   No major sores Musculoskeletal: Moderate neck and thoracic back discomfort.  Dry gangrene on the right foot greater and first toe.  No wet gangrene.  Fair ROM major joints   Results:   Labs: Results for orders placed or performed during the hospital encounter of 03/18/2018 (from the past 48 hour(s))  I-Stat Chem 8, ED     Status: Abnormal   Collection Time: 03/22/2018  9:08 PM  Result Value Ref Range   Sodium 137 135 - 145 mmol/L   Potassium 4.3 3.5 - 5.1 mmol/L   Chloride 98 98 - 111 mmol/L   BUN 26 (H) 6 - 20 mg/dL   Creatinine, Ser 0.80 0.61 - 1.24 mg/dL   Glucose, Bld 208 (H) 70 - 99 mg/dL   Calcium, Ion 1.10 (L) 1.15 - 1.40 mmol/L   TCO2 26 22 - 32 mmol/L   Hemoglobin 16.0 13.0 - 17.0 g/dL   HCT 47.0 39.0 - 52.0 %  I-Stat CG4 Lactic Acid, ED  (not at  [facility])     Status: Abnormal   Collection Time: 03/29/2018  9:09 PM  Result Value Ref Range   Lactic Acid, Venous 5.91 (HH) 0.5 - 1.9 mmol/L   Comment NOTIFIED PHYSICIAN   Comprehensive metabolic panel     Status: Abnormal   Collection Time: 03/18/2018  9:22 PM  Result Value Ref Range   Sodium 144 135 - 145 mmol/L    Comment: DELTA CHECK NOTED   Potassium 4.5 3.5 - 5.1 mmol/L   Chloride 100 98 - 111 mmol/L   CO2 24 22 - 32 mmol/L   Glucose, Bld 204 (H) 70 -  99 mg/dL   BUN 28 (H) 6 - 20 mg/dL   Creatinine, Ser 1.01 0.61 - 1.24 mg/dL   Calcium 9.4 8.9 - 10.3 mg/dL   Total Protein 7.0 6.5 - 8.1 g/dL   Albumin 2.5 (L) 3.5 - 5.0 g/dL   AST 24 15 - 41 U/L   ALT 20 0 - 44 U/L   Alkaline Phosphatase 128 (H) 38 - 126 U/L   Total Bilirubin  0.5 0.3 - 1.2 mg/dL   GFR calc non Af Amer >60 >60 mL/min   GFR calc Af Amer >60 >60 mL/min    Comment: (NOTE) The eGFR has been calculated using the CKD EPI equation. This calculation has not been validated in all clinical situations. eGFR's persistently <60 mL/min signify possible Chronic Kidney Disease.    Anion gap 20 (H) 5 - 15    Comment: Performed at [facility], [place] [address], [place], [place] [address]  CBC WITH DIFFERENTIAL     Status: Abnormal   Collection Time: 03/17/2018  9:22 PM  Result Value Ref Range   WBC 12.1 (H) 4.0 - 10.5 K/uL   RBC 5.01 4.22 - 5.81 MIL/uL   Hemoglobin 14.4 13.0 - 17.0 g/dL   HCT 44.9 39.0 - 52.0 %   MCV 89.6 78.0 - 100.0 fL   MCH 28.7 26.0 - 34.0 pg   MCHC 32.1 30.0 - 36.0 g/dL   RDW 17.1 (H) 11.5 - 15.5 %   Platelets 353 150 - 400 K/uL   Neutrophils Relative % 91 %   Neutro Abs 11.0 1.7 - 7.7 K/uL   Lymphocytes Relative 6 %   Lymphs Abs 0.7 0.7 - 4.0 K/uL   Monocytes Relative 3 %   Monocytes Absolute 0.4 0.1 - 1.0 K/uL   Eosinophils Relative 0 %   Eosinophils Absolute 0.0 0.0 - 0.7 K/uL   Basophils Relative 0 %   Basophils Absolute 0.0 0.0 - 0.1 K/uL   WBC Morphology WHITE COUNT CONFIRMED ON SMEAR     Comment: Performed at [facility], [place] [address], [place], [place] [address]  Lipase, blood     Status: None   Collection Time: 04/09/2018  9:22 PM  Result Value Ref Range   Lipase 21 11 - 51 U/L    Comment: Performed at [facility], [place] [address] [place], [place] [address]  TSH     Status: None   Collection Time: 04/09/2018  9:22 PM  Result Value Ref Range   TSH 3.712 0.350 - 4.500 uIU/mL    Comment: Performed by a 3rd Generation assay with a functional sensitivity of <=0.01 uIU/mL. Performed at [facility], [place] [address] [place], [place] [address]     Imaging / Studies: Dg Chest 2 View  Result Date: 02/25/2018 CLINICAL DATA:  Cough with fever and  chills EXAM: CHEST - 2 VIEW COMPARISON:  Chest CT January 07, 2018; chest radiograph December 16, 2017 FINDINGS: There remains adenopathy throughout the right azygos and paratracheal regions, similar to most recent study. There is no edema or consolidation. Heart size and pulmonary vascularity are normal. No evident bone lesions. IMPRESSION: Right-sided adenopathy, unchanged from recent study. Other areas of adenopathy seen on prior CT are not appreciable by radiography. No edema or consolidation. Previously noted mass in the posterior segment right upper lobe on chest CT is not appreciable by radiography. Heart size normal. Electronically Signed   By: Lowella Grip III M.D.   On:  02/25/2018 14:12   Dg Chest 2 View  Result Date: 02/16/2018 CLINICAL DATA:  Lung cancer, former smoker with dyspnea and chills today. EXAM: CHEST - 2 VIEW COMPARISON:  01/07/2018 chest CT FINDINGS: Redemonstration of right paratracheal soft tissue prominence compatible with known mediastinal lymphadenopathy. Midline trachea. No pulmonary consolidation. Lung volumes are slightly low with crowding of interstitial lung markings. No aggressive osseous lesions. Heart is top-normal in size. Nonaneurysmal thoracic aorta. IMPRESSION: 1. Low lung volumes with crowding of interstitial lung markings. No alveolar consolidations. 2. Known right paratracheal soft tissue prominence consistent with lymphadenopathy. No significant change. Electronically Signed   By: Ashley Royalty M.D.   On: 02/16/2018 02:59   Ct Angio Chest Pe W And/or Wo Contrast  Result Date: 02/25/2018 CLINICAL DATA:  Productive cough. Metastatic cancer with involvement of the right upper lobe EXAM: CT ANGIOGRAPHY CHEST WITH CONTRAST TECHNIQUE: Multidetector CT imaging of the chest was performed using the standard protocol during bolus administration of intravenous contrast. Multiplanar CT image reconstructions and MIPs were obtained to evaluate the vascular anatomy. CONTRAST:  159m  ISOVUE-370 IOPAMIDOL (ISOVUE-370) INJECTION 76% COMPARISON:  Multiple exams, including 01/07/2018 FINDINGS: Despite efforts by the technologist and patient, motion artifact is present on today's exam and could not be eliminated. This reduces exam sensitivity and specificity. Cardiovascular: No filling defect is identified in the pulmonary arterial tree to suggest pulmonary embolus. There is narrowing of the right upper lobe pulmonary artery due to extrinsic mass effect from tumor. Difficult to be certain that the tumor is not invading the right upper lobe pulmonary artery given the complete 360 degree engulfing of the vessel by the tumor, but I do not see obvious filling defect within the narrowed segment of pulmonary artery traversing the tumor. The contour is also fairly similar to 01/07/18. Mediastinum/Nodes: Bulky right paratracheal tumor as before, measuring up to 4.6 cm in short axis, formerly 4.8 cm. A prevascular node measures 1.6 cm in short axis on image 44/6, previously 2.1 cm. Right hilar node 1.9 cm in short axis on image 50/6, formerly 2.0 cm. Conglomerate right supraclavicular adenopathy difficult to separate from the sternocleidomastoid muscle, less sharply defined but mildly reduced in overall size compared to prior exam. A left supraclavicular node measures 2.2 cm in short axis on image 9/6, formerly the same. Bilateral pathologic axillary adenopathy. An index left axillary node measures 2.5 cm in short axis on image 31/6, significantly worsened, previously 0.9 cm. Right axillary and subpectoral adenopathy to prior. Lungs/Pleura: Paraseptal emphysema. The right upper lobe lung nodule measures up to 0.8 cm in short axis on image 43/12, previously 1.3 cm by my measurements. New ground-glass density band in the right upper lobe for example on image 55/12, likely from alveolitis, less likely a new focus of low-grade adenocarcinoma. Small amount of frothy fluid in the trachea and right mainstem bronchus.  Upper Abdomen: Unremarkable Musculoskeletal: Unremarkable Review of the MIP images confirms the above findings. IMPRESSION: 1. No filling defect is identified in the pulmonary arterial tree to suggest pulmonary embolus. Stable degree of mass effect on the right upper lobe pulmonary artery by the right mediastinal tumor. 2. Mixed appearance of the tumor burden, on balance the tumor burden is mildly reduced. Specifically, the bulky mediastinal tumor is mildly reduced in size; the right upper lobe bandlike pulmonary nodule is reduced in size; and mediastinal and right supraclavicular adenopathy is mildly reduced. However, left axillary adenopathy is significantly increased. 3. Other imaging findings of potential clinical significance: Emphysema (ICD10-J43.9). Mild  focus of alveolitis in the right upper lobe. Electronically Signed   By: Van Clines M.D.   On: 02/25/2018 18:21   Ct Abdomen Pelvis W Contrast  Addendum Date: 03/29/2018   ADDENDUM REPORT: 03/19/2018 22:46 ADDENDUM: Acute findings discussed with and reconfirmed by Dr.DAN FLOYD on 04/09/2018 at 10:46 pm. Electronically Signed   By: Elon Alas M.D.   On: 03/22/2018 22:46   Result Date: 03/16/2018 CLINICAL DATA:  Abdominal pain. History of stage IV lung cancer, aortofemoral bypass. EXAM: CT ABDOMEN AND PELVIS WITH CONTRAST TECHNIQUE: Multidetector CT imaging of the abdomen and pelvis was performed using the standard protocol following bolus administration of intravenous contrast. CONTRAST:  156m ISOVUE-300 IOPAMIDOL (ISOVUE-300) INJECTION 61% COMPARISON:  CT abdomen and pelvis December 24, 2017 FINDINGS: LOWER CHEST: Resolution of pleural effusions. Included heart size is normal. Bulky LEFT chest wall adenopathy new from prior CT. HEPATOBILIARY: Liver and gallbladder are normal. PANCREAS: Normal. SPLEEN: Normal. ADRENALS/URINARY TRACT: Kidneys are orthotopic, demonstrating symmetric enhancement. No nephrolithiasis, hydronephrosis or solid renal  masses. The unopacified ureters are normal in course and caliber. Delayed imaging through the kidneys demonstrates symmetric prompt contrast excretion within the proximal urinary collecting system. Urinary bladder is partially distended and unremarkable. Stable nonspecific 12 mm LEFT adrenal nodule. STOMACH/BOWEL: Small bowel dilated to 3.2 cm with multiple loose edematous bowel. Multiple small bowel transition points all localized within the LEFT central mid abdomen. No pneumatosis. Mild amount of retained large bowel stool. Normal appendix. VASCULAR/LYMPHATIC: Aortofemoral bypass. Patent proximal SMA and celiac axis, limited assessment by non angiographic phase. Patent bypass though not tailored for evaluation. No lymphadenopathy by CT size criteria. REPRODUCTIVE: Normal. OTHER: Moderate amount of low-density ascites. No intraperitoneal free. No central mesenteric mass. MUSCULOSKELETAL: Nonacute. Surgical clips bilateral inguinal soft tissues consistent with prior vascular access. Anterior abdominal subcutaneous emphysema, possibly from injection sites. IMPRESSION: 1. Small-bowel obstruction with multiple central transition points, this can be seen with adhesions or internal hernia. Edematous small bowel without pneumatosis. 2. Moderate ascites. 3. New bulky LEFT chest wall lymphadenopathy consistent with worsening metastatic disease. 4. Status post aorta by femoral bypass, patent vessels by non angiographic CT. Aortic Atherosclerosis (ICD10-I70.0). Electronically Signed: By: CElon AlasM.D. On: 03/18/2018 22:42   Dg Chest Port 1 View  Result Date: 03/12/2018 CLINICAL DATA:  Sepsis, bulky adenopathy, suspect metastatic lung cancer EXAM: PORTABLE CHEST 1 VIEW COMPARISON:  02/25/2018, 01/07/2018 FINDINGS: Bulky right paratracheal adenopathy again noted. Normal heart size. Lower lung volumes with increased basilar atelectasis. No large effusion or pneumothorax. Soft tissue prominence in the clavicular  regions compatible with known adenopathy. No acute osseous finding. IMPRESSION: Lower lung volumes with mild vascular congestion and basilar atelectasis. Grossly stable adenopathy. Electronically Signed   By: MJerilynn Mages  Shick M.D.   On: 03/20/2018 21:44   Dg Foot Complete Right  Result Date: 02/25/2018 CLINICAL DATA:  Necrosis of the great toe, second toe and third toe. EXAM: RIGHT FOOT COMPLETE - 3+ VIEW COMPARISON:  None. FINDINGS: Pronounced deficiency of the soft tissue of the distal toes, most notable at the second and third toes. No plain radiographic evidence of osteomyelitis. No recent fracture. Old healed fracture of the proximal phalanx of the fifth toe. IMPRESSION: Pronounced soft tissue deficiency of the distal toes. No plain radiographic evidence of osteomyelitis. Electronically Signed   By: MNelson ChimesM.D.   On: 02/25/2018 16:56    Medications / Allergies: per chart  Antibiotics: Anti-infectives (From admission, onward)   Start     Dose/Rate  Route Frequency Ordered Stop   04/05/2018 2100  ceFEPIme (MAXIPIME) 2 g in sodium chloride 0.9 % 100 mL IVPB     2 g 200 mL/hr over 30 Minutes Intravenous  Once 03/23/2018 2047 04/02/2018 2217   03/19/2018 2100  metroNIDAZOLE (FLAGYL) IVPB 500 mg     500 mg 100 mL/hr over 60 Minutes Intravenous Every 8 hours 04/10/2018 2047     04/11/2018 2100  vancomycin (VANCOCIN) IVPB 1000 mg/200 mL premix     1,000 mg 200 mL/hr over 60 Minutes Intravenous  Once 03/13/2018 2047          Note: Portions of this report may have been transcribed using voice recognition software. Every effort was made to ensure accuracy; however, inadvertent computerized transcription errors may be present.   Any transcriptional errors that result from this process are unintentional.    Tony Hector, MD, FACS, MASCRS [facility]    [address] [place], [place] [address] [phone] Main / Paging [phone]  Fax   04/09/2018

## 2018-03-14 NOTE — ED Provider Notes (Signed)
[facility] DEPT Provider Note   CSN: [id number] Arrival date & time: 03/31/2018  2035     History   Chief Complaint Chief Complaint  Patient presents with  . Abdominal Pain    HPI Tony [name] is a 52 y.o. male.  52 yo M with a chief complaints of diffuse abdominal pain.  This started about 1 this morning.  Had some episodes of nausea.  Has not wanted to eat or drink all day.  Felt that the pain in gotten persistently worse throughout the day.  He has not been taking his medications due to the pain.  Denies fevers or chills.  The history is provided by the patient.  Illness  This is a new problem. The current episode started 12 to 24 hours ago. The problem occurs constantly. The problem has been gradually worsening. Associated symptoms include abdominal pain. Pertinent negatives include no chest pain, no headaches and no shortness of breath. Nothing aggravates the symptoms. Nothing relieves the symptoms. He has tried nothing for the symptoms. The treatment provided no relief.    Past Medical History:  Diagnosis Date  . Anxiety   . Arterial occlusive disease    multilevel arterial occlusive disease/notes 11/13/2017  . Arthritis    "left knee" (11/13/2017)  . Cancer ([employer])    stage 4 lung cancer  . Chronic back pain   . Chronic lower back pain   . Depression   . Gastric ulcer due to Helicobacter pylori 3846  . GERD (gastroesophageal reflux disease)   . High cholesterol   . History of blood transfusion   . Hypertension   . IBS (irritable bowel syndrome)   . Mesenteric adenitis 2011   presumed/H&P  . Migraines    "use to suffer migraines years ago" (11/13/2017)  . Peripheral vascular disease ([place])   . Pneumonia 2011   "wife states he didn't have pneumonia"  . Ulcer     Patient Active Problem List   Diagnosis Date Noted  . Mediastinal adenopathy with near SVC syndrome 04/03/2018  . On antineoplastic chemotherapy 03/28/2018  .  Cancer-related pain 04/03/2018  . Ascites 03/30/2018  . Protein-calorie malnutrition, moderate ([place]) 03/15/2018  . Hyperglycemia 04/11/2018  . Metastatic lung cancer to axillary lymph nodes  04/07/2018  . SBO (small bowel obstruction) ([place]) 03/12/2018  . Malnutrition of moderate degree 02/28/2018  . Cellulitis 02/26/2018  . Malignant neoplasm metastatic to lung ([place])   . Dehydration 02/25/2018  . Sepsis ([place]) 02/25/2018  . Thrush of mouth and esophagus ([place]) 01/16/2018  . Counseling regarding advance care planning and goals of care 01/01/2018  . Adenocarcinoma ([place])   . SVC syndrome 12/31/2017  . Adenocarcinoma of left lung, stage 4 ([place]) 12/31/2017  . Mass in neck   . Neck pain due to malignant neoplastic disease ([place])   . Palliative care by specialist   . Uncontrolled pain   . Palliative care encounter   . Malignant neoplasm of upper lobe of right lung ([place]) 12/25/2017  . Gangrene of right foot ([place]) 12/20/2017  . AKI (acute kidney injury) ([place]) 12/20/2017  . PAD (peripheral artery disease) ([place]) 12/19/2017  . S/P aortobifemoral bypass surgery 12/17/2017  . Postoperative wound infection 12/17/2017  . Weight loss 12/17/2017  . Cervical lymphadenopathy 12/17/2017  . DVT (deep venous thrombosis) ([place]) 12/17/2017  . Aortoiliac occlusive disease ([place]) 11/15/2017  . Pain of right lower extremity due to ischemia 11/13/2017  . Hyperlipidemia LDL goal <70 11/13/2017  . PVD (peripheral  vascular disease) ([place]) 11/11/2017  . H. pylori infection 10/11/2011  . Duodenal ulcer 08/23/2011  . IBS (irritable bowel syndrome) 08/18/2011  . Depression with anxiety 05/11/2009  . EPIGASTRIC PAIN, CHRONIC 05/11/2009  . SMOKER 09/01/2008  . INSOMNIA, CHRONIC 09/01/2008  . Essential hypertension 09/01/2008  . PALPITATIONS, CHRONIC 09/01/2008  . GASTRITIS 10/24/2007    Past Surgical History:  Procedure Laterality Date  . AORTA - BILATERAL FEMORAL ARTERY BYPASS GRAFT Bilateral 11/15/2017    Procedure: AORTA BIFEMORAL BYPASS GRAFT;  Surgeon: Angelia Mould, MD;  Location: [facility];  Service: Vascular;  Laterality: Bilateral;  . BACK SURGERY  X 4   "procedure where they burn the nerves every 6 months"  . EMBOLECTOMY Left 12/01/2017   Procedure: THROMBECTOMY OF LEFT AORTAFEMORAL BYPASS, THROMBECTOMY OF TIBIAL ARTERY;  Surgeon: Rosetta Posner, MD;  Location: [facility] OR;  Service: Vascular;  Laterality: Left;  . ESOPHAGOGASTRODUODENOSCOPY  08/22/2011   Procedure: ESOPHAGOGASTRODUODENOSCOPY (EGD);  Surgeon: [name], MD;  Location: [facility];  Service: Gastroenterology;  Laterality: N/A;  . FEMORAL-POPLITEAL BYPASS GRAFT Right 11/15/2017   Procedure: Right FEMORAL-Above knee POPLITEAL ARTERY Bypass Graft using nonreversed saphenous vein ;  Surgeon: Angelia Mould, MD;  Location: [facility];  Service: Vascular;  Laterality: Right;  . FEMORAL-POPLITEAL BYPASS GRAFT Left 12/01/2017   Procedure: BYPASS GRAFT FEMORAL- ABOVE KNEE POPLITEAL ARTERY WITH VEIN;  Surgeon: Rosetta Posner, MD;  Location: [facility];  Service: Vascular;  Laterality: Left;  . INTRAOPERATIVE ARTERIOGRAM Right 11/15/2017   Procedure: INTRA OPERATIVE ARTERIOGRAM;  Surgeon: Angelia Mould, MD;  Location: [facility];  Service: Vascular;  Laterality: Right;  . INTRAOPERATIVE ARTERIOGRAM Left 12/01/2017   Procedure: INTRA OPERATIVE ARTERIOGRAM OF LEFT LEG;  Surgeon: Rosetta Posner, MD;  Location: [facility];  Service: Vascular;  Laterality: Left;  . IR US GUIDE BX ASP/DRAIN  12/18/2017  . KNEE ARTHROSCOPY Left X 5        Home Medications    Prior to Admission medications   Medication Sig Start Date End Date Taking? Authorizing Provider  albuterol (PROVENTIL) (2.5 MG/3ML) 0.083% nebulizer solution Take 3 mLs (2.5 mg total) by nebulization every 4 (four) hours as needed for wheezing or shortness of breath. 03/01/18  Yes Hall, Carole N, DO  amLODipine (NORVASC) 5 MG tablet Take 1 tablet (5 mg total) by mouth daily. 03/02/18  Yes Kayleen Memos, DO  chlorhexidine gluconate, MEDLINE KIT, (PERIDEX) 0.12 % solution 15 mLs by Mouth Rinse route 2 (two) times daily. 03/01/18  Yes Hall, Carole N, DO  dexamethasone (DECADRON) 4 MG tablet Take 1 tablet (4 mg total) by mouth daily. 02/22/18 03/24/18 Yes Acquanetta Chain, DO  dronabinol (MARINOL) 5 MG capsule Take 1 capsule (5 mg total) by mouth 2 (two) times daily before lunch and supper. 03/13/18 04/12/18 Yes Brunetta Genera, MD  DULoxetine (CYMBALTA) 60 MG capsule Take 1 capsule (60 mg total) by mouth daily. 02/14/18 03/16/18 Yes [name] L, DO  ferrous sulfate 325 (65 FE) MG tablet Take 1 tablet (325 mg total) by mouth daily with breakfast. 03/01/18  Yes Irene Pap N, DO  fluconazole (DIFLUCAN) 100 MG tablet Take 1 tablet (100 mg total) by mouth daily. 03/01/18 03/31/18 Yes Kayleen Memos, DO  fondaparinux (ARIXTRA) 7.5 MG/0.6ML SOLN injection Inject 0.6 mLs (7.5 mg total) into the skin daily at 6 PM for 26 days. 02/14/18 03/16/2018 Yes Acquanetta Chain, DO  gabapentin (NEURONTIN) 400 MG capsule Take 1 capsule (400 mg  total) by mouth 2 (two) times daily. 02/22/18  Yes [name] L, DO  HYDROmorphone (DILAUDID) 8 MG tablet Take 1 tablet (8 mg total) by mouth every 2 (two) hours as needed for severe pain. 03/01/18  Yes Hall, Carole N, DO  LORazepam (ATIVAN) 1 MG tablet Take 0.5 tablets (0.5 mg total) by mouth 2 (two) times daily AND 1 tablet (1 mg total) at bedtime. Patient taking differently: 1 mg at bedtime only 02/22/18 03/24/18 Yes Acquanetta Chain, DO  magic mouthwash SOLN Take 5-10 mLs by mouth 2 (two) times daily. 03/07/18  Yes [provider]  methadone (DOLOPHINE) 10 MG tablet Take 2 tablets (20 mg total) by mouth every 8 (eight) hours. 02/14/18 03/16/18 Yes Acquanetta Chain, DO  methocarbamol (ROBAXIN) 500 MG tablet Take 1 tablet (500 mg total) by mouth 2 (two) times daily. 03/01/18  Yes Kayleen Memos, DO  Multiple Vitamin (MULTIVITAMIN WITH MINERALS)  TABS tablet Take 1 tablet by mouth daily. 03/02/18  Yes Hall, Carole N, DO  NARCAN 4 MG/0.1ML LIQD nasal spray kit Place 1 spray into the nose daily as needed. Overdose of medication 01/23/18  Yes [provider]  OLANZapine (ZYPREXA) 5 MG tablet Take 1 tablet (5 mg total) by mouth at bedtime. 02/14/18 03/16/18 Yes [name] L, DO  ondansetron (ZOFRAN) 8 MG tablet Take 1 tablet (8 mg total) by mouth 2 (two) times daily as needed for refractory nausea / vomiting. Start on day 3 after chemo. 02/14/18  Yes [name] L, DO  pantoprazole (PROTONIX) 40 MG tablet Take 1 tablet (40 mg total) by mouth daily. 02/22/18 03/24/18 Yes [name] L, DO  polyethylene glycol (MIRALAX / GLYCOLAX) packet Take 17 g by mouth daily. 01/24/18  Yes Patrecia Pour, Christean Grief, MD  povidone-iodine (BETADINE) 10 % external solution Apply topically as needed for wound care. Patient taking differently: Apply 1 application topically 3 (three) times daily as needed for wound care.  01/23/18  Yes [name] L, DO  senna-docusate (SENOKOT-S) 8.6-50 MG tablet Take 2 tablets by mouth 2 (two) times daily. 01/23/18  Yes Patrecia Pour, Christean Grief, MD  sulfamethoxazole-trimethoprim (BACTRIM DS,SEPTRA DS) 800-160 MG tablet Take 1 tablet by mouth daily. 03/01/18 03/31/18 Yes Kayleen Memos, DO  triamcinolone cream (KENALOG) 0.1 % Apply topically 2 (two) times daily. 03/01/18  Yes Kayleen Memos, DO    Family History Family History  Problem Relation Age of Onset  . Hypertension Mother   . Heart disease Mother   . Hypertension Father   . Coronary artery disease Father 34  . Heart attack Brother 47  . Coronary artery disease Brother   . Heart disease Brother   . Diabetes Neg Hx   . Stroke Neg Hx     Social History Social History   Tobacco Use  . Smoking status: Former Smoker    Packs/day: 1.00    Years: 24.00    Pack years: 24.00    Types: Cigarettes    Start date: 11/28/2017    Last attempt to quit:  12/04/2017    Years since quitting: 0.2  . Smokeless tobacco: Never Used  Substance Use Topics  . Alcohol use: Not Currently  . Drug use: Not Currently    Types: Marijuana    Comment: 11/13/2017  "last drug use was in ~ 1987 "     Allergies   Pork-derived products; Shellfish-derived products; and Morphine and related   Review of Systems Review of Systems  Constitutional: Negative for  chills and fever.  HENT: Negative for congestion and facial swelling.   Eyes: Negative for discharge and visual disturbance.  Respiratory: Negative for shortness of breath.   Cardiovascular: Negative for chest pain and palpitations.  Gastrointestinal: Positive for abdominal pain, nausea and vomiting. Negative for diarrhea.  Musculoskeletal: Negative for arthralgias and myalgias.  Skin: Negative for color change and rash.  Neurological: Negative for tremors, syncope and headaches.  Psychiatric/Behavioral: Negative for confusion and dysphoric mood.     Physical Exam Updated Vital Signs BP (!) 139/98   Pulse (!) 146   Temp 98.4 F (36.9 C) (Oral)   Resp (!) 24   Ht 6' (1.829 m)   Wt 72.6 kg   SpO2 91%   BMI 21.71 kg/m   Physical Exam  Constitutional: He is oriented to person, place, and time. He appears well-developed and well-nourished.  HENT:  Head: Normocephalic and atraumatic.  Eyes: Pupils are equal, round, and reactive to light. EOM are normal.  Neck: Normal range of motion. Neck supple. No JVD present.  Cardiovascular: Normal rate and regular rhythm. Exam reveals no gallop and no friction rub.  No murmur heard. Pulmonary/Chest: No respiratory distress. He has no wheezes.  Scattered rhonchi  Abdominal: He exhibits no distension and no mass. There is tenderness (diffuse). There is no rebound and no guarding.  Musculoskeletal: Normal range of motion.  Neurological: He is alert and oriented to person, place, and time.  Skin: No rash noted. No pallor.  Psychiatric: He has a normal  mood and affect. His behavior is normal.  Nursing note and vitals reviewed.    ED Treatments / Results  Labs (all labs ordered are listed, but only abnormal results are displayed) Labs Reviewed  COMPREHENSIVE METABOLIC PANEL - Abnormal; Notable for the following components:      Result Value   Glucose, Bld 204 (*)    BUN 28 (*)    Albumin 2.5 (*)    Alkaline Phosphatase 128 (*)    Anion gap 20 (*)    All other components within normal limits  CBC WITH DIFFERENTIAL/PLATELET - Abnormal; Notable for the following components:   WBC 12.1 (*)    RDW 17.1 (*)    All other components within normal limits  I-STAT CG4 LACTIC ACID, ED - Abnormal; Notable for the following components:   Lactic Acid, Venous 5.91 (*)    All other components within normal limits  I-STAT CHEM 8, ED - Abnormal; Notable for the following components:   BUN 26 (*)    Glucose, Bld 208 (*)    Calcium, Ion 1.10 (*)    All other components within normal limits  CULTURE, BLOOD (ROUTINE X 2)  CULTURE, BLOOD (ROUTINE X 2)  URINE CULTURE  LIPASE, BLOOD  TSH  URINALYSIS, ROUTINE W REFLEX MICROSCOPIC  T4  PREALBUMIN  I-STAT CG4 LACTIC ACID, ED    EKG None  Radiology Ct Abdomen Pelvis W Contrast  Addendum Date: 04/06/2018   ADDENDUM REPORT: 03/19/2018 22:46 ADDENDUM: Acute findings discussed with and reconfirmed by Dr.Aryaa Bunting on 03/13/2018 at 10:46 pm. Electronically Signed   By: Elon Alas M.D.   On: 03/16/2018 22:46   Result Date: 03/15/2018 CLINICAL DATA:  Abdominal pain. History of stage IV lung cancer, aortofemoral bypass. EXAM: CT ABDOMEN AND PELVIS WITH CONTRAST TECHNIQUE: Multidetector CT imaging of the abdomen and pelvis was performed using the standard protocol following bolus administration of intravenous contrast. CONTRAST:  124m ISOVUE-300 IOPAMIDOL (ISOVUE-300) INJECTION 61% COMPARISON:  CT abdomen  and pelvis December 24, 2017 FINDINGS: LOWER CHEST: Resolution of pleural effusions. Included heart  size is normal. Bulky LEFT chest wall adenopathy new from prior CT. HEPATOBILIARY: Liver and gallbladder are normal. PANCREAS: Normal. SPLEEN: Normal. ADRENALS/URINARY TRACT: Kidneys are orthotopic, demonstrating symmetric enhancement. No nephrolithiasis, hydronephrosis or solid renal masses. The unopacified ureters are normal in course and caliber. Delayed imaging through the kidneys demonstrates symmetric prompt contrast excretion within the proximal urinary collecting system. Urinary bladder is partially distended and unremarkable. Stable nonspecific 12 mm LEFT adrenal nodule. STOMACH/BOWEL: Small bowel dilated to 3.2 cm with multiple loose edematous bowel. Multiple small bowel transition points all localized within the LEFT central mid abdomen. No pneumatosis. Mild amount of retained large bowel stool. Normal appendix. VASCULAR/LYMPHATIC: Aortofemoral bypass. Patent proximal SMA and celiac axis, limited assessment by non angiographic phase. Patent bypass though not tailored for evaluation. No lymphadenopathy by CT size criteria. REPRODUCTIVE: Normal. OTHER: Moderate amount of low-density ascites. No intraperitoneal free. No central mesenteric mass. MUSCULOSKELETAL: Nonacute. Surgical clips bilateral inguinal soft tissues consistent with prior vascular access. Anterior abdominal subcutaneous emphysema, possibly from injection sites. IMPRESSION: 1. Small-bowel obstruction with multiple central transition points, this can be seen with adhesions or internal hernia. Edematous small bowel without pneumatosis. 2. Moderate ascites. 3. New bulky LEFT chest wall lymphadenopathy consistent with worsening metastatic disease. 4. Status post aorta by femoral bypass, patent vessels by non angiographic CT. Aortic Atherosclerosis (ICD10-I70.0). Electronically Signed: By: Elon Alas M.D. On: 03/15/2018 22:42   Dg Chest Port 1 View  Result Date: 03/29/2018 CLINICAL DATA:  Sepsis, bulky adenopathy, suspect metastatic  lung cancer EXAM: PORTABLE CHEST 1 VIEW COMPARISON:  02/25/2018, 01/07/2018 FINDINGS: Bulky right paratracheal adenopathy again noted. Normal heart size. Lower lung volumes with increased basilar atelectasis. No large effusion or pneumothorax. Soft tissue prominence in the clavicular regions compatible with known adenopathy. No acute osseous finding. IMPRESSION: Lower lung volumes with mild vascular congestion and basilar atelectasis. Grossly stable adenopathy. Electronically Signed   By: Jerilynn Mages.  Shick M.D.   On: 04/02/2018 21:44    Procedures Procedures (including critical care time)  Medications Ordered in ED Medications  metroNIDAZOLE (FLAGYL) IVPB 500 mg (500 mg Intravenous New Bag/Given 04/11/2018 2215)  vancomycin (VANCOCIN) IVPB 1000 mg/200 mL premix (1,000 mg Intravenous New Bag/Given 04/06/2018 2237)  iopamidol (ISOVUE-300) 61 % injection (has no administration in time range)  sodium chloride 0.9 % injection (has no administration in time range)  lidocaine (XYLOCAINE) 2 % jelly 1 application (has no administration in time range)  lactated ringers bolus 1,000 mL (has no administration in time range)  diatrizoate meglumine-sodium (GASTROGRAFIN) 66-10 % solution 90 mL (has no administration in time range)  lactated ringers bolus 1,000 mL (has no administration in time range)  lactated ringers bolus 1,000 mL (has no administration in time range)  acetaminophen (TYLENOL) suppository 650 mg (has no administration in time range)  methocarbamol (ROBAXIN) 1,000 mg in dextrose 5 % 50 mL IVPB (has no administration in time range)  ondansetron (ZOFRAN) injection 4 mg (has no administration in time range)    Or  ondansetron (ZOFRAN) 8 mg in sodium chloride 0.9 % 50 mL IVPB (has no administration in time range)  prochlorperazine (COMPAZINE) injection 5-10 mg (has no administration in time range)  lip balm (CARMEX) ointment 1 application (has no administration in time range)  magic mouthwash (has no  administration in time range)  bisacodyl (DULCOLAX) suppository 10 mg (has no administration in time range)  guaiFENesin-dextromethorphan (ROBITUSSIN DM) 100-10  MG/5ML syrup 10 mL (has no administration in time range)  hydrocortisone (ANUSOL-HC) 2.5 % rectal cream 1 application (has no administration in time range)  alum & mag hydroxide-simeth (MAALOX/MYLANTA) 200-200-20 MG/5ML suspension 30 mL (has no administration in time range)  hydrocortisone cream 1 % 1 application (has no administration in time range)  menthol-cetylpyridinium (CEPACOL) lozenge 3 mg (has no administration in time range)  phenol (CHLORASEPTIC) mouth spray 1-2 spray (has no administration in time range)  lactated ringers bolus 1,000 mL (1,000 mLs Intravenous New Bag/Given 03/31/2018 2216)    And  lactated ringers bolus 1,000 mL (0 mLs Intravenous Stopped 04/05/2018 2216)    And  lactated ringers bolus 250 mL (0 mLs Intravenous Stopped 04/11/2018 2235)  ceFEPIme (MAXIPIME) 2 g in sodium chloride 0.9 % 100 mL IVPB (0 g Intravenous Stopped 04/05/2018 2217)  HYDROmorphone (DILAUDID) injection 0.5 mg (0.5 mg Intravenous Given 03/17/2018 2131)  ondansetron (ZOFRAN) injection 4 mg (4 mg Intravenous Given 03/23/2018 2131)  iopamidol (ISOVUE-300) 61 % injection 100 mL (100 mLs Intravenous Contrast Given 03/19/2018 2159)  fentaNYL (SUBLIMAZE) injection 100 mcg (100 mcg Intravenous Given 03/26/2018 2301)     Initial Impression / Assessment and Plan / ED Course  I have reviewed the triage vital signs and the nursing notes.  Pertinent labs & imaging results that were available during my care of the patient were reviewed by me and considered in my medical decision making (see chart for details).     52 yo M with chief complaint of abdominal pain and vomiting.  This started last night.  Patient has a history of stage IV lung cancer currently on chemotherapy had his last treatment yesterday.  She came in with a heart rate in the 170s.  No fever orally.  His  CBC yesterday was without neutropenia, will obtain a rectal temperature.  Diffusely tender across his abdomen on my exam.  Will obtain a CT.  CT scan with small bowel obstruction.  Patient has free fluid on exam as well.  I discussed this with the radiologist.  I then discussed it with general surgeon, Dr. gross.  He recommended giving at least 3 L of IV fluids.  He will intimately reviewed the images and the patient will be seen by general surgery.  Will try NG tube placement. Will discuss with the hospitalist.  The patient is refusing even the attempt of NG tube placement.  Discussed with him about risks and benefits.  Currently declined therapy.  CRITICAL CARE Performed by: Cecilio Asper   Total critical care time: 80 minutes  Critical care time was exclusive of separately billable procedures and treating other patients.  Critical care was necessary to treat or prevent imminent or life-threatening deterioration.  Critical care was time spent personally by me on the following activities: development of treatment plan with patient and/or surrogate as well as nursing, discussions with consultants, evaluation of patient's response to treatment, examination of patient, obtaining history from patient or surrogate, ordering and performing treatments and interventions, ordering and review of laboratory studies, ordering and review of radiographic studies, pulse oximetry and re-evaluation of patient's condition.  The patients results and plan were reviewed and discussed.   Any x-rays performed were independently reviewed by myself.   Differential diagnosis were considered with the presenting HPI.  Medications  metroNIDAZOLE (FLAGYL) IVPB 500 mg (500 mg Intravenous New Bag/Given 03/12/2018 2215)  vancomycin (VANCOCIN) IVPB 1000 mg/200 mL premix (1,000 mg Intravenous New Bag/Given 03/26/2018 2237)  iopamidol (ISOVUE-300) 61 %  injection (has no administration in time range)  sodium chloride 0.9 %  injection (has no administration in time range)  lidocaine (XYLOCAINE) 2 % jelly 1 application (has no administration in time range)  lactated ringers bolus 1,000 mL (has no administration in time range)  diatrizoate meglumine-sodium (GASTROGRAFIN) 66-10 % solution 90 mL (has no administration in time range)  lactated ringers bolus 1,000 mL (has no administration in time range)  lactated ringers bolus 1,000 mL (has no administration in time range)  acetaminophen (TYLENOL) suppository 650 mg (has no administration in time range)  methocarbamol (ROBAXIN) 1,000 mg in dextrose 5 % 50 mL IVPB (has no administration in time range)  ondansetron (ZOFRAN) injection 4 mg (has no administration in time range)    Or  ondansetron (ZOFRAN) 8 mg in sodium chloride 0.9 % 50 mL IVPB (has no administration in time range)  prochlorperazine (COMPAZINE) injection 5-10 mg (has no administration in time range)  lip balm (CARMEX) ointment 1 application (has no administration in time range)  magic mouthwash (has no administration in time range)  bisacodyl (DULCOLAX) suppository 10 mg (has no administration in time range)  guaiFENesin-dextromethorphan (ROBITUSSIN DM) 100-10 MG/5ML syrup 10 mL (has no administration in time range)  hydrocortisone (ANUSOL-HC) 2.5 % rectal cream 1 application (has no administration in time range)  alum & mag hydroxide-simeth (MAALOX/MYLANTA) 200-200-20 MG/5ML suspension 30 mL (has no administration in time range)  hydrocortisone cream 1 % 1 application (has no administration in time range)  menthol-cetylpyridinium (CEPACOL) lozenge 3 mg (has no administration in time range)  phenol (CHLORASEPTIC) mouth spray 1-2 spray (has no administration in time range)  lactated ringers bolus 1,000 mL (1,000 mLs Intravenous New Bag/Given 04/10/2018 2216)    And  lactated ringers bolus 1,000 mL (0 mLs Intravenous Stopped 03/31/2018 2216)    And  lactated ringers bolus 250 mL (0 mLs Intravenous Stopped  03/16/2018 2235)  ceFEPIme (MAXIPIME) 2 g in sodium chloride 0.9 % 100 mL IVPB (0 g Intravenous Stopped 04/01/2018 2217)  HYDROmorphone (DILAUDID) injection 0.5 mg (0.5 mg Intravenous Given 04/04/2018 2131)  ondansetron (ZOFRAN) injection 4 mg (4 mg Intravenous Given 04/11/2018 2131)  iopamidol (ISOVUE-300) 61 % injection 100 mL (100 mLs Intravenous Contrast Given 03/28/2018 2159)  fentaNYL (SUBLIMAZE) injection 100 mcg (100 mcg Intravenous Given 03/22/2018 2301)    Vitals:   04/02/2018 2130 04/04/2018 2211 04/09/2018 2230 04/07/2018 2300  BP: (!) 148/115 (!) 159/104 (!) 143/97 (!) 139/98  Pulse:  (!) 155 (!) 149 (!) 146  Resp: (!) 26 (!) 27 (!) 24 (!) 24  Temp:      TempSrc:      SpO2: 99% 95% 93% 91%  Weight:      Height:        Final diagnoses:  SBO (small bowel obstruction) ([place])    Admission/ observation were discussed with the admitting physician, patient and/or family and they are comfortable with the plan.    Final Clinical Impressions(s) / ED Diagnoses   Final diagnoses:  SBO (small bowel obstruction) [facility])    ED Discharge Orders    None       Deno Etienne, DO 04/10/2018 2331

## 2018-03-14 NOTE — ED Notes (Signed)
Notified EDP,Floyd, pt. I-stat Lactic acid 5.91 and RN, Estill Bamberg, G. Made aware.

## 2018-03-14 NOTE — ED Notes (Signed)
Pt is a hard stick, John RN tried 2x, this nurse tried 2x, and Avery, NT tired 2x for second set of blood cultures.

## 2018-03-14 NOTE — ED Notes (Signed)
Patient transported to CT 

## 2018-03-14 NOTE — ED Notes (Signed)
Pt refused Rectal Temperature

## 2018-03-15 ENCOUNTER — Encounter (HOSPITAL_COMMUNITY): Payer: Self-pay | Admitting: Internal Medicine

## 2018-03-15 DIAGNOSIS — F17200 Nicotine dependence, unspecified, uncomplicated: Secondary | ICD-10-CM

## 2018-03-15 DIAGNOSIS — C3411 Malignant neoplasm of upper lobe, right bronchus or lung: Secondary | ICD-10-CM

## 2018-03-15 DIAGNOSIS — Z7901 Long term (current) use of anticoagulants: Secondary | ICD-10-CM

## 2018-03-15 DIAGNOSIS — Z515 Encounter for palliative care: Secondary | ICD-10-CM

## 2018-03-15 DIAGNOSIS — R100 Acute abdomen: Secondary | ICD-10-CM

## 2018-03-15 DIAGNOSIS — I10 Essential (primary) hypertension: Secondary | ICD-10-CM

## 2018-03-15 DIAGNOSIS — C773 Secondary and unspecified malignant neoplasm of axilla and upper limb lymph nodes: Secondary | ICD-10-CM

## 2018-03-15 DIAGNOSIS — G893 Neoplasm related pain (acute) (chronic): Secondary | ICD-10-CM

## 2018-03-15 DIAGNOSIS — K56609 Unspecified intestinal obstruction, unspecified as to partial versus complete obstruction: Secondary | ICD-10-CM

## 2018-03-15 DIAGNOSIS — I871 Compression of vein: Secondary | ICD-10-CM

## 2018-03-15 DIAGNOSIS — R590 Localized enlarged lymph nodes: Secondary | ICD-10-CM

## 2018-03-15 LAB — CBC WITH DIFFERENTIAL/PLATELET
Basophils Absolute: 0 10*3/uL (ref 0.0–0.1)
Basophils Relative: 0 %
EOS PCT: 0 %
Eosinophils Absolute: 0 10*3/uL (ref 0.0–0.7)
HEMATOCRIT: 36.8 % — AB (ref 39.0–52.0)
Hemoglobin: 11.6 g/dL — ABNORMAL LOW (ref 13.0–17.0)
LYMPHS ABS: 0.5 10*3/uL (ref 0.7–4.0)
LYMPHS PCT: 5 %
MCH: 28 pg (ref 26.0–34.0)
MCHC: 31.5 g/dL (ref 30.0–36.0)
MCV: 88.7 fL (ref 78.0–100.0)
MONO ABS: 0.3 10*3/uL (ref 0.1–1.0)
Monocytes Relative: 3 %
Neutro Abs: 8.6 10*3/uL (ref 1.7–7.7)
Neutrophils Relative %: 92 %
Platelets: 278 10*3/uL (ref 150–400)
RBC: 4.15 MIL/uL — AB (ref 4.22–5.81)
RDW: 17.1 % — ABNORMAL HIGH (ref 11.5–15.5)
WBC Morphology: INCREASED
WBC: 9.4 10*3/uL (ref 4.0–10.5)

## 2018-03-15 LAB — TYPE AND SCREEN
ABO/RH(D): O POS
ANTIBODY SCREEN: NEGATIVE

## 2018-03-15 LAB — COMPREHENSIVE METABOLIC PANEL
ALBUMIN: 2 g/dL — AB (ref 3.5–5.0)
ALK PHOS: 89 U/L (ref 38–126)
ALT: 17 U/L (ref 0–44)
ANION GAP: 14 (ref 5–15)
AST: 27 U/L (ref 15–41)
BILIRUBIN TOTAL: 0.6 mg/dL (ref 0.3–1.2)
BUN: 24 mg/dL — ABNORMAL HIGH (ref 6–20)
CO2: 26 mmol/L (ref 22–32)
Calcium: 8.7 mg/dL — ABNORMAL LOW (ref 8.9–10.3)
Chloride: 99 mmol/L (ref 98–111)
Creatinine, Ser: 0.7 mg/dL (ref 0.61–1.24)
GFR calc non Af Amer: >60 mL/min (ref >60)
GLUCOSE: 133 mg/dL — AB (ref 70–99)
Potassium: 4.5 mmol/L (ref 3.5–5.1)
Sodium: 139 mmol/L (ref 135–145)
Total Protein: 5.6 g/dL — ABNORMAL LOW (ref 6.5–8.1)

## 2018-03-15 LAB — URINALYSIS, ROUTINE W REFLEX MICROSCOPIC
Bilirubin Urine: NEGATIVE
GLUCOSE, UA: NEGATIVE mg/dL
HGB URINE DIPSTICK: NEGATIVE
KETONES UR: NEGATIVE mg/dL
Leukocytes, UA: NEGATIVE
Nitrite: NEGATIVE
PROTEIN: NEGATIVE mg/dL
Specific Gravity, Urine: >1.046 — ABNORMAL HIGH (ref 1.005–1.030)
pH: 6 (ref 5.0–8.0)

## 2018-03-15 LAB — PROTIME-INR
INR: 1.04
PROTHROMBIN TIME: 13.5 s (ref 11.4–15.2)

## 2018-03-15 LAB — APTT: aPTT: 27 seconds (ref 24–36)

## 2018-03-15 LAB — LACTIC ACID, PLASMA: Lactic Acid, Venous: 4 mmol/L (ref 0.5–1.9)

## 2018-03-15 LAB — MRSA PCR SCREENING: MRSA by PCR: POSITIVE — AB

## 2018-03-15 LAB — PROCALCITONIN: PROCALCITONIN: 0.83 ng/mL

## 2018-03-15 MED ORDER — SODIUM CHLORIDE 0.9 % IV SOLN
50.0000 ug/h | INTRAVENOUS | Status: DC
  Filled 2018-03-15: qty 1

## 2018-03-15 MED ORDER — LACTATED RINGERS IV SOLN
INTRAVENOUS | Status: DC
  Administered 2018-03-15: 05:00:00 via INTRAVENOUS

## 2018-03-15 MED ORDER — ONDANSETRON HCL 4 MG PO TABS: 4.0000 mg | ORAL_TABLET | Freq: Four times a day (QID) | ORAL | Status: DC | PRN

## 2018-03-15 MED ORDER — LORAZEPAM BOLUS VIA INFUSION
2.0000 mg | INTRAVENOUS | Status: DC | PRN
  Administered 2018-03-15 (×3): 2 mg via INTRAVENOUS
  Filled 2018-03-15: qty 2

## 2018-03-15 MED ORDER — LORAZEPAM BOLUS VIA INFUSION
2.0000 mg | INTRAVENOUS | Status: DC | PRN
  Administered 2018-03-15: 5 mg via INTRAVENOUS
  Administered 2018-03-16: 3 mg via INTRAVENOUS
  Administered 2018-03-16: 5 mg via INTRAVENOUS
  Administered 2018-03-16: 2 mg via INTRAVENOUS
  Administered 2018-03-16: 5 mg via INTRAVENOUS
  Administered 2018-03-16: 2 mg via INTRAVENOUS
  Administered 2018-03-16 (×2): 5 mg via INTRAVENOUS
  Administered 2018-03-17: 2 mg via INTRAVENOUS
  Administered 2018-03-17 – 2018-03-20 (×6): 5 mg via INTRAVENOUS
  Administered 2018-03-20: 2 mg via INTRAVENOUS
  Filled 2018-03-15: qty 5

## 2018-03-15 MED ORDER — LORAZEPAM 2 MG/ML IJ SOLN
0.5000 mg | Freq: Three times a day (TID) | INTRAMUSCULAR | Status: DC | PRN
  Administered 2018-03-15 (×2): 1 mg via INTRAVENOUS
  Filled 2018-03-15 (×2): qty 1

## 2018-03-15 MED ORDER — HYDROMORPHONE HCL 1 MG/ML IJ SOLN
0.5000 mg | INTRAMUSCULAR | Status: DC | PRN
  Administered 2018-03-15: 2 mg via INTRAVENOUS
  Administered 2018-03-15 (×3): 1 mg via INTRAVENOUS
  Administered 2018-03-15: 2 mg via INTRAVENOUS
  Administered 2018-03-15: 1 mg via INTRAVENOUS
  Administered 2018-03-15: 2 mg via INTRAVENOUS
  Administered 2018-03-15: 1 mg via INTRAVENOUS
  Filled 2018-03-15: qty 1
  Filled 2018-03-15: qty 2
  Filled 2018-03-15 (×2): qty 1
  Filled 2018-03-15: qty 2
  Filled 2018-03-15 (×2): qty 1
  Filled 2018-03-15: qty 2

## 2018-03-15 MED ORDER — METRONIDAZOLE IN NACL 5-0.79 MG/ML-% IV SOLN
500.0000 mg | Freq: Three times a day (TID) | INTRAVENOUS | Status: DC
  Administered 2018-03-15: 500 mg via INTRAVENOUS

## 2018-03-15 MED ORDER — ONDANSETRON HCL 4 MG/2ML IJ SOLN: 4.0000 mg | Freq: Four times a day (QID) | INTRAMUSCULAR | Status: DC | PRN

## 2018-03-15 MED ORDER — SODIUM CHLORIDE 0.9 % IV SOLN
1.0000 mg/h | INTRAVENOUS | Status: DC
  Administered 2018-03-15: 1 mg/h via INTRAVENOUS
  Administered 2018-03-16 – 2018-03-21 (×7): 2 mg/h via INTRAVENOUS
  Administered 2018-03-21: 2.5 mg/h via INTRAVENOUS
  Filled 2018-03-15 (×10): qty 5

## 2018-03-15 MED ORDER — ONDANSETRON HCL 4 MG/2ML IJ SOLN
4.0000 mg | Freq: Once | INTRAMUSCULAR | Status: AC
  Administered 2018-03-15: 4 mg via INTRAVENOUS
  Filled 2018-03-15: qty 2

## 2018-03-15 MED ORDER — DEXAMETHASONE SODIUM PHOSPHATE 10 MG/ML IJ SOLN
10.0000 mg | Freq: Once | INTRAMUSCULAR | Status: AC
  Administered 2018-03-15: 10 mg via INTRAVENOUS
  Filled 2018-03-15: qty 1

## 2018-03-15 MED ORDER — LABETALOL HCL 5 MG/ML IV SOLN: 5.0000 mg | INTRAVENOUS | Status: DC | PRN

## 2018-03-15 MED ORDER — HYDROMORPHONE HCL 2 MG/ML IJ SOLN
2.0000 mg | Freq: Once | INTRAMUSCULAR | Status: AC
  Administered 2018-03-15: 2 mg via INTRAVENOUS

## 2018-03-15 MED ORDER — MORPHINE BOLUS VIA INFUSION
5.0000 mg | INTRAVENOUS | Status: DC | PRN
  Filled 2018-03-15: qty 20

## 2018-03-15 MED ORDER — SODIUM CHLORIDE 0.9 % IV SOLN: 2.0000 g | Freq: Once | INTRAVENOUS | Status: DC

## 2018-03-15 MED ORDER — SODIUM CHLORIDE 0.9 % IV SOLN
INTRAVENOUS | Status: DC | PRN
  Administered 2018-03-15 (×2): 250 mL via INTRAVENOUS
  Administered 2018-03-18: 500 mL via INTRAVENOUS
  Administered 2018-03-20: 250 mL via INTRAVENOUS

## 2018-03-15 MED ORDER — ACETAMINOPHEN 650 MG RE SUPP: 650.0000 mg | Freq: Four times a day (QID) | RECTAL | Status: DC | PRN

## 2018-03-15 MED ORDER — SODIUM CHLORIDE 0.9 % IV SOLN
2.0000 g | Freq: Three times a day (TID) | INTRAVENOUS | Status: DC
  Administered 2018-03-15: 2 g via INTRAVENOUS
  Filled 2018-03-15 (×3): qty 2

## 2018-03-15 MED ORDER — MORPHINE 100MG IN NS 100ML (1MG/ML) PREMIX INFUSION: 10.0000 mg/h | INTRAVENOUS | Status: DC

## 2018-03-15 MED ORDER — HYDROMORPHONE HCL 2 MG/ML IJ SOLN
2.0000 mg | INTRAMUSCULAR | Status: DC | PRN
  Administered 2018-03-15 – 2018-03-16 (×5): 2 mg via INTRAVENOUS
  Filled 2018-03-15 (×2): qty 1

## 2018-03-15 MED ORDER — LORAZEPAM 2 MG/ML IJ SOLN
1.0000 mg/h | INTRAVENOUS | Status: DC
  Administered 2018-03-15: 3 mg/h via INTRAVENOUS
  Administered 2018-03-15: 2 mg/h via INTRAVENOUS
  Administered 2018-03-16: 5 mg/h via INTRAVENOUS
  Administered 2018-03-16: 4 mg/h via INTRAVENOUS
  Administered 2018-03-17 – 2018-03-21 (×12): 5 mg/h via INTRAVENOUS
  Filled 2018-03-15 (×17): qty 25

## 2018-03-15 MED ORDER — FENTANYL CITRATE (PF) 100 MCG/2ML IJ SOLN
100.0000 ug | Freq: Once | INTRAMUSCULAR | Status: AC
  Administered 2018-03-15: 100 ug via INTRAVENOUS
  Filled 2018-03-15: qty 2

## 2018-03-15 MED ORDER — ACETAMINOPHEN 325 MG PO TABS: 650.0000 mg | ORAL_TABLET | Freq: Four times a day (QID) | ORAL | Status: DC | PRN

## 2018-03-15 MED ORDER — ALBUTEROL SULFATE (2.5 MG/3ML) 0.083% IN NEBU: 2.5000 mg | INHALATION_SOLUTION | RESPIRATORY_TRACT | Status: DC | PRN

## 2018-03-15 NOTE — Progress Notes (Signed)
PROGRESS NOTE  Brief Narrative: Tony [name] is a 52 y.o. male with a history of stage IV lung CA s/p XRT recently receiving palliative chemotherapy, recent admission for right chest cellulitis, PVD s/p LE bypass, thrombectomy with dry gangrene to toes of right foot, and DVT, SVC syndrome, and HTN who presented to the ED due to abrupt severe abdominal pain, vomiting found to be septic with lactate 5.91 and evidence of SBO with transition point on CT. General surgery consulted and empiric antibiotics started. He has been persistently tachycardic, hyPERtensive, and developing progressive delirium complicated by tachypnea and hypoxia.   Subjective: I spoke with the patient's wife at length in the ED this morning as the patient is delirious, unable to follow conversation. His wife reports that they have discussed his code status and wishes in this case. He said he would want full code and to have surgery if these are offered as options for him. He is looking more restless since arrival and in pain despite IV narcotics every hour.  Objective: BP (!) 155/103   Pulse (!) 162   Temp 97.7 F (36.5 C) (Oral)   Resp (!) 37   Ht 6' (1.829 m)   Wt 72.5 kg   SpO2 97%   BMI 21.68 kg/m   Gen: Chronically ill-appearing male, restless, constantly moving extremities, sitting up in bed, not responsive to most commands. Pulm: Clear, shallow tachypneic respirations. 85% when resting with mostly mouth breathing.   CV: Regular tachycardia with narrow complexes on monitor, range 140-160bpm, no murmur, no JVD, no leg edema GI: Distended, groans to palpation, no response to bed shake. Minimal bowel sounds. +Fluid wave. Neuro: Keeps eyes closed, not responsive to commands but restlessly moving all extremities equally. Does cough with posterior oropharynx stim. Skin: Dry gangrene of several toes on right foot without surrounding erythema or discharge. Right upper chest/neck with edema hyperpigmentation and warm  erythema. Transverse shallow wounds on right neck with granulation/slough.  Lymph: Bilateral tender lymphadenopathy L > R in axillae.   Assessment & Plan: SBO: With acute abdomen. Unable to place NG tube. - Appreciate general surgery involvement. They have recommended against laparotomy and discussed with family.  - Will focus on aggressive pain control, symptom management.   SIRS with worsening acute hypoxic respiratory failure: Long discussion with wife at bedside this morning who confirmed full code/full measures.  - Will proceed with SDU admission. Since delirium is worsening and I suspect hypoxia will progressively worsen, I've asked for CCM involvement per my [facility] discussions. I appreciate their assistance, though I have communicated to the patient's wife that mechanical ventilation would be unlikely to change the course of his illness.  - The patient's wife is very familiar with palliative care, has been followed by [facility], and was particularly impressed with her prior discussion with Dr. Rhea Pink who will be able to consult.   DVT: Holding anticoagulation in light of possible need for surgery.   Other conditions will be managed per H&P from this morning by Dr. Hal Hope.   Patrecia Pour, MD 03/15/2018, 10:53 AM
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
## 2018-03-15 NOTE — ED Notes (Signed)
Pt refusing blood draw for lactic acid, pt does not want this nurse to stick

## 2018-03-15 NOTE — ED Notes (Signed)
2nd set of blood cultures not collected as pt has already received abx

## 2018-03-15 NOTE — Progress Notes (Signed)
Patient ID: Tony [name], male   DOB: 08-Mar-1966, 52 y.o.   MRN: [id number]     Subjective: Patient is seen in the ICU.  He is in moderate distress and poorly communicative.  States "everything" hurts but mainly his abdomen.  Objective: Vital signs in last 24 hours: Temp:  [97.7 F (36.5 C)-98.4 F (36.9 C)] 97.7 F (36.5 C) (10/04 1037) Pulse Rate:  [143-170] 162 (10/04 1037) Resp:  [21-45] 37 (10/04 1037) BP: (117-164)/(88-133) 155/103 (10/04 1037) SpO2:  [87 %-99 %] 97 % (10/04 1030) Weight:  [72.5 kg-72.6 kg] 72.5 kg (10/04 1037)    Intake/Output from previous day: 10/03 0701 - 10/04 0700 In: 5750 [IV Piggyback:5750] Out: -  Intake/Output this shift: Total I/O In: 746.6 [I.V.:640.1; IV Piggyback:106.5] Out: -   General appearance: Responsive but poorly so and likely confused.  Not really able to answer specific questions. Neck:  Bulky adenopathy right neck and supraclavicular area with radiation changes Resp: Somewhat labored respirations.  Cannot lie flat GI: Healed midline incision.  Not particularly distended.  Marked diffused tenderness with guarding.  Lab Results:  Recent Labs    04/03/2018 2122 03/15/18 0239  WBC 12.1* 9.4  HGB 14.4 11.6*  HCT 44.9 36.8*  PLT 353 278   BMET Recent Labs    03/31/2018 2122 03/15/18 0239  NA 144 139  K 4.5 4.5  CL 100 99  CO2 24 26  GLUCOSE 204* 133*  BUN 28* 24*  CREATININE 1.01 0.70  CALCIUM 9.4 8.7*     Studies/Results: Ct Abdomen Pelvis W Contrast  Addendum Date: 04/10/2018   ADDENDUM REPORT: 03/18/2018 22:46 ADDENDUM: Acute findings discussed with and reconfirmed by Dr.DAN FLOYD on 04/11/2018 at 10:46 pm. Electronically Signed   By: Elon Alas M.D.   On: 03/30/2018 22:46   Result Date: 03/20/2018 CLINICAL DATA:  Abdominal pain. History of stage IV lung cancer, aortofemoral bypass. EXAM: CT ABDOMEN AND PELVIS WITH CONTRAST TECHNIQUE: Multidetector CT imaging of the abdomen and pelvis was performed using  the standard protocol following bolus administration of intravenous contrast. CONTRAST:  148mL ISOVUE-300 IOPAMIDOL (ISOVUE-300) INJECTION 61% COMPARISON:  CT abdomen and pelvis December 24, 2017 FINDINGS: LOWER CHEST: Resolution of pleural effusions. Included heart size is normal. Bulky LEFT chest wall adenopathy new from prior CT. HEPATOBILIARY: Liver and gallbladder are normal. PANCREAS: Normal. SPLEEN: Normal. ADRENALS/URINARY TRACT: Kidneys are orthotopic, demonstrating symmetric enhancement. No nephrolithiasis, hydronephrosis or solid renal masses. The unopacified ureters are normal in course and caliber. Delayed imaging through the kidneys demonstrates symmetric prompt contrast excretion within the proximal urinary collecting system. Urinary bladder is partially distended and unremarkable. Stable nonspecific 12 mm LEFT adrenal nodule. STOMACH/BOWEL: Small bowel dilated to 3.2 cm with multiple loose edematous bowel. Multiple small bowel transition points all localized within the LEFT central mid abdomen. No pneumatosis. Mild amount of retained large bowel stool. Normal appendix. VASCULAR/LYMPHATIC: Aortofemoral bypass. Patent proximal SMA and celiac axis, limited assessment by non angiographic phase. Patent bypass though not tailored for evaluation. No lymphadenopathy by CT size criteria. REPRODUCTIVE: Normal. OTHER: Moderate amount of low-density ascites. No intraperitoneal free. No central mesenteric mass. MUSCULOSKELETAL: Nonacute. Surgical clips bilateral inguinal soft tissues consistent with prior vascular access. Anterior abdominal subcutaneous emphysema, possibly from injection sites. IMPRESSION: 1. Small-bowel obstruction with multiple central transition points, this can be seen with adhesions or internal hernia. Edematous small bowel without pneumatosis. 2. Moderate ascites. 3. New bulky LEFT chest wall lymphadenopathy consistent with worsening metastatic disease. 4. Status post aorta  by femoral bypass,  patent vessels by non angiographic CT. Aortic Atherosclerosis (ICD10-I70.0). Electronically Signed: By: Elon Alas M.D. On: 04/09/2018 22:42   Dg Chest Port 1 View  Result Date: 03/22/2018 CLINICAL DATA:  Sepsis, bulky adenopathy, suspect metastatic lung cancer EXAM: PORTABLE CHEST 1 VIEW COMPARISON:  02/25/2018, 01/07/2018 FINDINGS: Bulky right paratracheal adenopathy again noted. Normal heart size. Lower lung volumes with increased basilar atelectasis. No large effusion or pneumothorax. Soft tissue prominence in the clavicular regions compatible with known adenopathy. No acute osseous finding. IMPRESSION: Lower lung volumes with mild vascular congestion and basilar atelectasis. Grossly stable adenopathy. Electronically Signed   By: Jerilynn Mages.  Shick M.D.   On: 04/06/2018 21:44    Anti-infectives: Anti-infectives (From admission, onward)   Start     Dose/Rate Route Frequency Ordered Stop   03/15/18 0600  ceFEPIme (MAXIPIME) 2 g in sodium chloride 0.9 % 100 mL IVPB     2 g 200 mL/hr over 30 Minutes Intravenous Every 8 hours 03/15/18 0251     03/15/18 0345  metroNIDAZOLE (FLAGYL) IVPB 500 mg     500 mg 100 mL/hr over 60 Minutes Intravenous Every 8 hours 03/15/18 0224     03/15/18 0230  ceFEPIme (MAXIPIME) 2 g in sodium chloride 0.9 % 100 mL IVPB  Status:  Discontinued     2 g 200 mL/hr over 30 Minutes Intravenous  Once 03/15/18 0224 03/15/18 0309   03/22/2018 2100  ceFEPIme (MAXIPIME) 2 g in sodium chloride 0.9 % 100 mL IVPB     2 g 200 mL/hr over 30 Minutes Intravenous  Once 04/08/2018 2047 03/28/2018 2217   03/20/2018 2100  metroNIDAZOLE (FLAGYL) IVPB 500 mg  Status:  Discontinued     500 mg 100 mL/hr over 60 Minutes Intravenous Every 8 hours 03/30/2018 2047 03/15/18 1018   04/06/2018 2100  vancomycin (VANCOCIN) IVPB 1000 mg/200 mL premix     1,000 mg 200 mL/hr over 60 Minutes Intravenous  Once 04/04/2018 2047 04/10/2018 2346      Assessment/Plan: Patient with advanced metastatic lung cancer  undergoing palliative chemo and radiation.  History of peripheral vascular disease status post aortofemoral bypass.  Presents with acute abdominal pain.  CT scan findings show ascites, question thickened and mildly dilated bowel.  Significant lactic acidosis. He has an acute abdomen on exam.  Possibilities include ischemia on a vascular basis or closed-loop obstruction or malignancy. He is unstable with marked tachycardia.  Particularly in light of his end-stage lung cancer I think any chance of meaningful recovery from a laparotomy is extremely small and I think he likely would not survive the procedure.  Obviously his prognosis would remain poor even if he survived the operation. I had a conference with the patient's wife and sister and discussed options.  I did not recommend laparotomy for the above reasons.  She understands very well the issues involved and does not want her husband put through surgery under the circumstances.  We discussed continued supportive care and symptom management with likely very poor prognosis.  She is comfortable with this plan.    LOS: 1 day    Edward Jolly 03/15/2018

## 2018-03-15 NOTE — Consult Note (Signed)
NAME:  Tony [name], MRN:  [id number], DOB:  02-Aug-1965, LOS: 1 ADMISSION DATE:  03/20/2018, CONSULTATION DATE:  10/4 REFERRING MD: Bonner Puna, CHIEF COMPLAINT:  Acute respiratory failure and shock    Brief History   52 year old male patient with extensive stage IV lung cancer currently on palliative radiation current palliative chemotherapy, admitted on 10/3 with acute abdomen/small bowel obstruction.  Critical care asked to see him 10/4 when he was clinically declining  Past Medical History  Poorly differentiated widely metastatic stage IV lung cancer felt likely primary adenocarcinoma of the lung.  Currently on palliative carboplatin/taxol and palliative radiation SVC syndrome History of DVT on anticoagulation (fondaparinux)  Significant Hospital Events   10/3 presented to the hospital with small bowel obstruction.  Placed on bowel rest, IV fluids, empiric antibiotics, seen by surgery.  Patient refusing NG tube placement 10/4: Clinically declining overnight, more confused, remarkable pain.  Critical care consulted as patient wife insisting on full code in spite of being deemed not surgical candidate   Consults: date of consult/date signed off & final recs:  Surgical team consulted 10/3 Critical care consult 10/4  Procedures (surgical and bedside):    Significant Diagnostic Tests:  CT abdomen/pelvis: Small bowel obstruction with multiple central transition points.  Question adhesion versus internal hernia.  There is edematous small bowel without pneumatosis currently there is a moderate amount of ascites  Micro Data:  10/3: Blood cultures x2  Antimicrobials:  Cefepime started 10/3 Flagyl started 10/3 VanComycin started 10/3. Subjective:  Abdomen hurts  Objective   Blood pressure (Abnormal) 155/103, pulse (Abnormal) 162, temperature 97.7 F (36.5 C), temperature source Oral, resp. rate (Abnormal) 37, height 6' (1.829 m), weight 72.5 kg, SpO2 97 %.        Intake/Output  Summary (Last 24 hours) at 03/15/2018 1125 Last data filed at 03/15/2018 1042 Gross per 24 hour  Intake 6496.6 ml  Output no documentation  Net 6496.6 ml   Filed Weights   03/15/2018 2038 04/03/2018 2041 03/15/18 1037  Weight: 72.6 kg 72.6 kg 72.5 kg    Examination: General: Acutely ill-appearing 52 year old male appears to be actively dying HENT: Neck is large neck veins engorged and swollen, SVC syndrome noted with burn type skin changes over her neck and chest particularly on the right chest mucous membranes are dry Lungs: Tachypneic, accessory use.  Diminished throughout Cardiovascular: Tachycardic regular rate and rhythm Abdomen: Acutely tender to palpation hypoactive Extremities: Warm and dry, the right toes are necrotic Neuro: Awake, confused, agitated, moving all extremities GU: Very yellow  Resolved Hospital Problem list     Assessment & Plan:   Acute abd/SBO with presence of lactic acidosis suspect bowel ischemia.  Deemed not a surgical candidate Plan Continue supportive measures including IV therapy Bowel rest Gastric decompression if he will allow [place] Initiate analgesia infusion Supportive care  Severe sepsis suspect possible bacterial translocation Plan Continue antibiotics with vancomycin, Flagyl, and cefepime. Continue IV hydration Not a candidate for pressors.  No feasible safe location for central access given SVC syndrome and anticoagulation  Sinus tachycardia. Suspect element of pain and underlying pathology Plan Supportive care  Acute metabolic encephalopathy setting of bowel ischemia, acidosis, and sepsis likely Plan Supportive care  History of extensive upper extremity DVT Plan SCDs lower extremities  Stage IV widely metastatic lung cancer Plan Palliative care consulted  Anemia of critical illness Plan Supportive care     Disposition / Summary of Today's Plan 03/15/18   Unfortunate 52 year old male patient with extensive widely  metastatic lung cancer, associated upper extremity DVT and SVC syndrome.  Admitted now with acute abdomen, probably ischemic bowel in the setting of what appears to be small bowel obstruction.  Deemed not a surgical candidate.  Family wants aggressive care but does not want him to be uncomfortable.  He appears to be in severe pain.  We will initiate analgesia infusion, try to place NG tube, continue IV hydration and supportive care.  We have made it clear that we expect she will die in the next 24 hours.    Diet: NPO pain/Anxiety/Delirium protocol (if indicated): NA VAP protocol (if indicated): NA DVT prophylaxis: SCD GI prophylaxis: H2B Hyperglycemia protocol: NA Mobility: BR Code Status: Full code.  Family Communication: Prolonged discussion w/ family (wife). He is deemed NOT a surgical candidate. Explained that CPR and ventilation in this setting would be futile and have told her very much against medical advice. She on one hand says she is ok with accepting if his heart stops not to try to re-start it But wants every thing done up to that point to prolong his life.  Labs   CBC: Recent Labs  Lab 03/13/18 0834 04/04/2018 2108 04/10/2018 2122 03/15/18 0239  WBC 13.0*  --  12.1* 9.4  NEUTROABS 12.1*  --  11.0 8.6  HGB 10.1* 16.0 14.4 11.6*  HCT 31.2* 47.0 44.9 36.8*  MCV 86.9  --  89.6 88.7  PLT 292  --  353 742    Basic Metabolic Panel: Recent Labs  Lab 03/13/18 0834 03/30/2018 2108 04/01/2018 2122 03/15/18 0239  NA 138 137 144 139  K 4.7 4.3 4.5 4.5  CL 100 98 100 99  CO2 28  --  24 26  GLUCOSE 90 208* 204* 133*  BUN 16 26* 28* 24*  CREATININE 0.63 0.80 1.01 0.70  CALCIUM 9.8  --  9.4 8.7*  MG 1.8  --   --   --    GFR: Estimated Creatinine Clearance: 114.5 mL/min (by C-G formula based on SCr of 0.7 mg/dL). Recent Labs  Lab 03/13/18 0834 04/09/2018 2109 03/23/2018 2122 03/15/18 0239 03/15/18 0241  PROCALCITON  --   --   --   --  0.83  WBC 13.0*  --  12.1* 9.4  --     LATICACIDVEN  --  5.91*  --   --  4.0*    Liver Function Tests: Recent Labs  Lab 03/13/18 0834 04/10/2018 2122 03/15/18 0239  AST '23 24 27  '$ ALT '20 20 17  '$ ALKPHOS 114 128* 89  BILITOT <0.2* 0.5 0.6  PROT 7.0 7.0 5.6*  ALBUMIN 2.5* 2.5* 2.0*   Recent Labs  Lab 03/13/2018 2122  LIPASE 21   No results for input(s): AMMONIA in the last 168 hours.  ABG    Component Value Date/Time   PHART 7.420 02/28/2018 0425   PCO2ART 33.4 02/28/2018 0425   PO2ART 83.2 02/28/2018 0425   HCO3 21.2 02/28/2018 0425   TCO2 26 03/16/2018 2108   ACIDBASEDEF 2.2 (H) 02/28/2018 0425   O2SAT 96.2 02/28/2018 0425     Coagulation Profile: Recent Labs  Lab 03/15/18 0241  INR 1.04    Cardiac Enzymes: No results for input(s): CKTOTAL, CKMB, CKMBINDEX, TROPONINI in the last 168 hours.  HbA1C: Hgb A1c MFr Bld  Date/Time Value Ref Range Status  11/13/2017 05:11 AM 5.0 4.8 - 5.6 % Final    Comment:    (NOTE) Pre diabetes:          5.7%-6.4% Diabetes:              >  6.4% Glycemic control for   <7.0% adults with diabetes     CBG: No results for input(s): GLUCAP in the last 168 hours.  Admitting History of Present Illness.    52 year old male patient with an extensive history this includes stage IV lung cancer status post radiation therapy and palliative chemotherapy, right chest wall cellulitis, severe peripheral vascular disease status post lower extremity bypass complicated by arterial thrombus requiring thrombectomy with resultant dry gangrene changes to the right foot, also has a history of DVT and SVC syndrome.  Admitted on 10/3 with chief complaint of intractable abdominal pain with associated vomiting and nausea.  CT abdomen pelvis showed dilated small bowel loops with some edema and ascites there is no evidence of perforation or pneumatosis at that point he was admitted with working diagnosis of small bowel obstruction.  Surgical services were consulted unfortunately he had been refusing  NG tube placement.  He was admitted, provided with, IV hydration, analgesia, empiric antibiotics, anticoagulation was held and he was left n.p.o.  He was seen in the morning on 10/4 over the evening he has become progressively tachycardic, and more encephalopathic.  He was seen by general surgery who felt he did indeed have an acute abdomen but also felt he would not survive surgical procedure and therefore it was decided he was not a surgical candidate.  Critical care was consulted given his progressive decline.  Review of Systems:   Due to delirium  Past Medical History  He,  has a past medical history of Anxiety, Arterial occlusive disease, Arthritis, Cancer ([place]), Chronic back pain, Chronic lower back pain, Depression, Gastric ulcer due to Helicobacter pylori (2355), GERD (gastroesophageal reflux disease), High cholesterol, History of blood transfusion, Hypertension, IBS (irritable bowel syndrome), Mesenteric adenitis (2011), Migraines, Peripheral vascular disease ([place]), Pneumonia (2011), and Ulcer.   Surgical History    Past Surgical History:  Procedure Laterality Date  . AORTA - BILATERAL FEMORAL ARTERY BYPASS GRAFT Bilateral 11/15/2017   Procedure: AORTA BIFEMORAL BYPASS GRAFT;  Surgeon: Angelia Mould, MD;  Location: [place];  Service: Vascular;  Laterality: Bilateral;  . BACK SURGERY  X 4   "procedure where they burn the nerves every 6 months"  . EMBOLECTOMY Left 12/01/2017   Procedure: THROMBECTOMY OF LEFT AORTAFEMORAL BYPASS, THROMBECTOMY OF TIBIAL ARTERY;  Surgeon: Rosetta Posner, MD;  [facility];  Service: Vascular;  Laterality: Left;  . ESOPHAGOGASTRODUODENOSCOPY  08/22/2011   Procedure: ESOPHAGOGASTRODUODENOSCOPY (EGD);  Surgeon: [name], MD;  Location: [place];  Service: Gastroenterology;  Laterality: N/A;  . FEMORAL-POPLITEAL BYPASS GRAFT Right 11/15/2017   Procedure: Right FEMORAL-Above knee POPLITEAL ARTERY Bypass Graft using nonreversed saphenous vein ;   Surgeon: Angelia Mould, MD;  Location: [place];  Service: Vascular;  Laterality: Right;  . FEMORAL-POPLITEAL BYPASS GRAFT Left 12/01/2017   Procedure: BYPASS GRAFT FEMORAL- ABOVE KNEE POPLITEAL ARTERY WITH VEIN;  Surgeon: Rosetta Posner, MD;  Location: [place];  Service: Vascular;  Laterality: Left;  . INTRAOPERATIVE ARTERIOGRAM Right 11/15/2017   Procedure: INTRA OPERATIVE ARTERIOGRAM;  Surgeon: Angelia Mould, MD;  Location: [place];  Service: Vascular;  Laterality: Right;  . INTRAOPERATIVE ARTERIOGRAM Left 12/01/2017   Procedure: INTRA OPERATIVE ARTERIOGRAM OF LEFT LEG;  Surgeon: Rosetta Posner, MD;  Location: [place];  Service: Vascular;  Laterality: Left;  . IR US GUIDE BX ASP/DRAIN  12/18/2017  . KNEE ARTHROSCOPY Left X 5     Social History   Social History   Socioeconomic History  .  Marital status: Married    Spouse name: Not on file  . Number of children: 3  . Years of education: Not on file  . Highest education level: Not on file  Occupational History  . Occupation: Admin. Assistant    Employer: [employer]  Social Needs  . Financial resource strain: Not on file  . Food insecurity:    Worry: Not on file    Inability: Not on file  . Transportation needs:    Medical: Not on file    Non-medical: Not on file  Tobacco Use  . Smoking status: Former Smoker    Packs/day: 1.00    Years: 24.00    Pack years: 24.00    Types: Cigarettes    Start date: 11/28/2017    Last attempt to quit: 12/04/2017    Years since quitting: 0.2  . Smokeless tobacco: Never Used  Substance and Sexual Activity  . Alcohol use: Not Currently  . Drug use: Not Currently    Types: Marijuana    Comment: 11/13/2017  "last drug use was in ~ 1987 "  . Sexual activity: Not on file  Lifestyle  . Physical activity:    Days per week: Not on file    Minutes per session: Not on file  . Stress: Not on file  Relationships  . Social connections:    Talks on phone: Not on file    Gets together: Not on  file    Attends religious service: Not on file    Active member of club or organization: Not on file    Attends meetings of clubs or organizations: Not on file    Relationship status: Not on file  . Intimate partner violence:    Fear of current or ex partner: Not on file    Emotionally abused: Not on file    Physically abused: Not on file    Forced sexual activity: Not on file  Other Topics Concern  . Not on file  Social History Narrative   Lives with wife and two children.  Administrator at [employer]  ,  reports that he quit smoking about 3 months ago. His smoking use included cigarettes. He started smoking about 3 months ago. He has a 24.00 pack-year smoking history. He has never used smokeless tobacco. He reports that he drank alcohol. He reports that he has current or past drug history. Drug: Marijuana.   Family History   His family history includes Coronary artery disease in his brother; Coronary artery disease (age of onset: 42) in his father; Heart attack (age of onset: 43) in his brother; Heart disease in his brother and mother; Hypertension in his father and mother. There is no history of Diabetes or Stroke.   Allergies Allergies  Allergen Reactions  . Pork-Derived Products Other (See Comments)    UNSPECIFIED REASON - Pt doesn't eat pork  . Shellfish-Derived Products Other (See Comments)    Doesn't want to eat  . Morphine And Related Other (See Comments)    Hallucinations     Home Medications  Prior to Admission medications   Medication Sig Start Date End Date Taking? Authorizing Provider  albuterol (PROVENTIL) (2.5 MG/3ML) 0.083% nebulizer solution Take 3 mLs (2.5 mg total) by nebulization every 4 (four) hours as needed for wheezing or shortness of breath. 03/01/18  Yes Hall, Carole N, DO  amLODipine (NORVASC) 5 MG tablet Take 1 tablet (5 mg total) by mouth daily. 03/02/18  Yes Kayleen Memos, DO  chlorhexidine gluconate, MEDLINE  KIT, (PERIDEX) 0.12 % solution 15 mLs by Mouth  Rinse route 2 (two) times daily. 03/01/18  Yes Hall, Carole N, DO  dexamethasone (DECADRON) 4 MG tablet Take 1 tablet (4 mg total) by mouth daily. 02/22/18 03/24/18 Yes Acquanetta Chain, DO  dronabinol (MARINOL) 5 MG capsule Take 1 capsule (5 mg total) by mouth 2 (two) times daily before lunch and supper. 03/13/18 04/12/18 Yes Brunetta Genera, MD  DULoxetine (CYMBALTA) 60 MG capsule Take 1 capsule (60 mg total) by mouth daily. 02/14/18 03/16/18 Yes [name] L, DO  ferrous sulfate 325 (65 FE) MG tablet Take 1 tablet (325 mg total) by mouth daily with breakfast. 03/01/18  Yes Irene Pap N, DO  fluconazole (DIFLUCAN) 100 MG tablet Take 1 tablet (100 mg total) by mouth daily. 03/01/18 03/31/18 Yes Kayleen Memos, DO  fondaparinux (ARIXTRA) 7.5 MG/0.6ML SOLN injection Inject 0.6 mLs (7.5 mg total) into the skin daily at 6 PM for 26 days. 02/14/18 03/18/2018 Yes Acquanetta Chain, DO  gabapentin (NEURONTIN) 400 MG capsule Take 1 capsule (400 mg total) by mouth 2 (two) times daily. 02/22/18  Yes [name] L, DO  HYDROmorphone (DILAUDID) 8 MG tablet Take 1 tablet (8 mg total) by mouth every 2 (two) hours as needed for severe pain. 03/01/18  Yes Hall, Carole N, DO  LORazepam (ATIVAN) 1 MG tablet Take 0.5 tablets (0.5 mg total) by mouth 2 (two) times daily AND 1 tablet (1 mg total) at bedtime. Patient taking differently: 1 mg at bedtime only 02/22/18 03/24/18 Yes Acquanetta Chain, DO  magic mouthwash SOLN Take 5-10 mLs by mouth 2 (two) times daily. 03/07/18  Yes [provider]  methadone (DOLOPHINE) 10 MG tablet Take 2 tablets (20 mg total) by mouth every 8 (eight) hours. 02/14/18 03/16/18 Yes Acquanetta Chain, DO  methocarbamol (ROBAXIN) 500 MG tablet Take 1 tablet (500 mg total) by mouth 2 (two) times daily. 03/01/18  Yes Kayleen Memos, DO  Multiple Vitamin (MULTIVITAMIN WITH MINERALS) TABS tablet Take 1 tablet by mouth daily. 03/02/18  Yes Hall, Carole N, DO  NARCAN 4 MG/0.1ML  LIQD nasal spray kit Place 1 spray into the nose daily as needed. Overdose of medication 01/23/18  Yes [provider]  OLANZapine (ZYPREXA) 5 MG tablet Take 1 tablet (5 mg total) by mouth at bedtime. 02/14/18 03/16/18 Yes [name] L, DO  ondansetron (ZOFRAN) 8 MG tablet Take 1 tablet (8 mg total) by mouth 2 (two) times daily as needed for refractory nausea / vomiting. Start on day 3 after chemo. 02/14/18  Yes [name] L, DO  pantoprazole (PROTONIX) 40 MG tablet Take 1 tablet (40 mg total) by mouth daily. 02/22/18 03/24/18 Yes [name] L, DO  polyethylene glycol (MIRALAX / GLYCOLAX) packet Take 17 g by mouth daily. 01/24/18  Yes Patrecia Pour, Christean Grief, MD  povidone-iodine (BETADINE) 10 % external solution Apply topically as needed for wound care. Patient taking differently: Apply 1 application topically 3 (three) times daily as needed for wound care.  01/23/18  Yes [name] L, DO  senna-docusate (SENOKOT-S) 8.6-50 MG tablet Take 2 tablets by mouth 2 (two) times daily. 01/23/18  Yes Patrecia Pour, Christean Grief, MD  sulfamethoxazole-trimethoprim (BACTRIM DS,SEPTRA DS) 800-160 MG tablet Take 1 tablet by mouth daily. 03/01/18 03/31/18 Yes Kayleen Memos, DO  triamcinolone cream (KENALOG) 0.1 % Apply topically 2 (two) times daily. 03/01/18  Yes Kayleen Memos, DO     Derrall Colace ACNP-BC [place]  [facility] Pager # [phone] OR # [phone] if no answer

## 2018-03-15 NOTE — Care Management Note (Signed)
Case Management Note  Patient Details  Name: Tony [name] MRN: [id number] Date of Birth: 1966-02-29  Subjective/Objective:                  Sbo, iv maxipime. Iv lr at 125cc/hrs, iv flagyl npo Action/Plan: Following for progression of care and discharge planning No cm needs present at this time.  Expected Discharge Date:  (unknown)               Expected Discharge Plan:     In-House Referral:     Discharge planning Services     Post Acute Care Choice:    Choice offered to:     DME Arranged:    DME Agency:     HH Arranged:    HH Agency:     Status of Service:     If discussed at [employer], dates discussed:    Additional Comments:  Leeroy Cha, RN 03/15/2018, 10:22 AM

## 2018-03-15 NOTE — Progress Notes (Signed)
Pharmacy Antibiotic Note  Tony [name] is a 52 y.o. male admitted on 04/06/2018 with intra-abdominal infection.  Pharmacy has been consulted for cefepime dosing.  Plan: Cefepime 2 Gm IV q8h Flagyl 500 mg IV q8h (MD) F/u scr/cultures  Height: 6' (182.9 cm) Weight: 160 lb 0.9 oz (72.6 kg) IBW/kg (Calculated) : 77.6  Temp (24hrs), Avg:98.4 F (36.9 C), Min:98.4 F (36.9 C), Max:98.4 F (36.9 C)  Recent Labs  Lab 03/13/18 0834 03/29/2018 2108 04/05/2018 2109 03/13/2018 2122  WBC 13.0*  --   --  12.1*  CREATININE 0.63 0.80  --  1.01  LATICACIDVEN  --   --  5.91*  --     Estimated Creatinine Clearance: 90.8 mL/min (by C-G formula based on SCr of 1.01 mg/dL).    Allergies  Allergen Reactions  . Pork-Derived Products Other (See Comments)    UNSPECIFIED REASON - Pt doesn't eat pork  . Shellfish-Derived Products Other (See Comments)    Doesn't want to eat  . Morphine And Related Other (See Comments)    Hallucinations    Antimicrobials this admission: 10/3 cefepime >>  10/3 flagyl >> 10/3 vancomycin >> x1 ED  Dose adjustments this admission:   Microbiology results:  BCx:   UCx:    Sputum:    MRSA PCR:   Thank you for allowing pharmacy to be a part of this patient's care.  Dorrene German 03/15/2018 2:49 AM

## 2018-03-15 NOTE — Progress Notes (Addendum)
Palliative Care Services Admission Follow-up Note Req: CCM, EDP   Mr. Tony [name] is a 52 year old well-known to the palliative care service who has advanced metastatic lung cancer, he is undergone extensive radiation to his left neck and chest wall, but unfortunately has not been able to tolerate chemotherapy due to his multiple other chronic medical problems including advanced peripheral vascular disease, extremity gangrene, occult to control cancer related pain, DVT in his upper right arm and is now being admitted with an acute onset small bowel obstruction associated intestinal ischemia.  On admission he had an elevated lactic acid CT scan showed multiple areas of central obstruction of the small bowel probably related to metastatic disease or vascular ischemia, his CT also shows cancer progression in the chest wall.  I was notified of his admission by the admitting hospitalist and I saw him when he arrived in the ICU. CCM had just spoken with patient and his wife and due to the terminal nature of his disease, his intense suffering there are few medical interventions that will help him other than a focus on complete comfort care.Goal now is complete comfort care, his cancer has progressed despite treatment and he will not be able to survive this.   I found him in extreme distress trying to get out of the bed, confused, saying he had to go to the bathroom- agitated delirium. His HR was in the 160's, hypertensive, starting to get congested.  His wife knows he is at EOL, she wants full comfort care:  1. Dilaudid infusion, 1mg /hr with 2mg  bolus every 43min PRN pain, dyspnea 2. Ativan infusion 1mg /hr, bolus 1mg  q2 hours prn agitation 3. Octreotide to reduce obstructive gastric fluid prodiction 4. High dose decadron X1 to reduce bowel edema and nausea/pain  Recommend with the severity of his symptom and need for infusion titration that he stay in step/down ICU for now. I anticipate a hospital death  hours-days only. His wife is contacting family.Will have chaplain visit. Provided support to his wife Tony [name] who was at his bedside.  Total Time:35 min Greater than 50%  of this time was spent counseling and coordinating care related to the above assessment and plan.  [name], DO Palliative Medicine' [phone] (cell)

## 2018-03-15 NOTE — ED Notes (Signed)
Pt refused NG tube overnight . Pt continues to refuse at this time

## 2018-03-15 NOTE — Progress Notes (Signed)
PCCM:  Discussion with wife at bedside. Patient with advanced staged lung cancer, stage 4 metastatic disease now with GCS 6, lactic acidosis, CT with SBO, likely malignant SBO and bowel ischemia, surgery has declined for operation.   Family has agreed to make patient comfortable with continuous pain medication infusion. Understanding he has an incurable disease. DDNR status   We will pursue palliative care. Comfort is the only goal.   The wife is calling family, children as well as their pastor.   Garner Nash, DO [facility] 03/15/2018 12:29 PM  Personal pager: [phone] If unanswered, please page CCM On-call: [phone]

## 2018-03-15 NOTE — ED Notes (Signed)
Dr.Gross verbal to this nurse, control pain, control nausea, and fluids

## 2018-03-15 NOTE — H&P (Addendum)
History and Physical    Tony [name] ZTI:[id number] DOB: 23-May-1966 DOA: 04/11/2018  PCP: [name], MD  Patient coming from: Home.  Chief Complaint: Abdominal pain nausea vomiting.  HPI: Tony [name] is a 52 y.o. male with history of adenocarcinoma of the lung stage IV who is receiving chemotherapy last one was 2 days ago and has received radiation therapy who was recently admitted for sepsis possibly from cellulitis involving the radiation area and is on anticoagulation for DVT has had peripheral vascular disease status post bypass and thrombectomy early part of this year with history of hypertension was brought to the ER after patient was having sudden onset of abdominal pain since yesterday morning 1 AM.  Has had multiple episodes of vomiting.  Abdominal pain is diffuse and severe.  Did not have any chest pain or shortness of breath.  ED Course: In the ER patient remained very tachycardic.  Lactate is markedly elevated above 5.  CT abdomen pelvis done shows features concerning for small bowel obstruction with transition point and on-call general surgeon Dr. Johney Maine has been consulted and admitted for further management of small bowel obstruction.  Since patient had sepsis picture patient was started on empiric antibiotics.  NG tube was more possible to be placed since patient was not cooperating.  Review of Systems: As per HPI, rest all negative.   Past Medical History:  Diagnosis Date  . Anxiety   . Arterial occlusive disease    multilevel arterial occlusive disease/notes 11/13/2017  . Arthritis    "left knee" (11/13/2017)  . Cancer ([place])    stage 4 lung cancer  . Chronic back pain   . Chronic lower back pain   . Depression   . Gastric ulcer due to Helicobacter pylori 8250  . GERD (gastroesophageal reflux disease)   . High cholesterol   . History of blood transfusion   . Hypertension   . IBS (irritable bowel syndrome)   . Mesenteric adenitis 2011   presumed/H&P  .  Migraines    "use to suffer migraines years ago" (11/13/2017)  . Peripheral vascular disease ([place])   . Pneumonia 2011   "wife states he didn't have pneumonia"  . Ulcer     Past Surgical History:  Procedure Laterality Date  . AORTA - BILATERAL FEMORAL ARTERY BYPASS GRAFT Bilateral 11/15/2017   Procedure: AORTA BIFEMORAL BYPASS GRAFT;  Surgeon: Angelia Mould, MD;  Location: [place];  Service: Vascular;  Laterality: Bilateral;  . BACK SURGERY  X 4   "procedure where they burn the nerves every 6 months"  . EMBOLECTOMY Left 12/01/2017   Procedure: THROMBECTOMY OF LEFT AORTAFEMORAL BYPASS, THROMBECTOMY OF TIBIAL ARTERY;  Surgeon: Rosetta Posner, MD;  Location: [facility];  Service: Vascular;  Laterality: Left;  . ESOPHAGOGASTRODUODENOSCOPY  08/22/2011   Procedure: ESOPHAGOGASTRODUODENOSCOPY (EGD);  Surgeon: [name], MD;  Location: [place];  Service: Gastroenterology;  Laterality: N/A;  . FEMORAL-POPLITEAL BYPASS GRAFT Right 11/15/2017   Procedure: Right FEMORAL-Above knee POPLITEAL ARTERY Bypass Graft using nonreversed saphenous vein ;  Surgeon: Angelia Mould, MD;  Location: [place];  Service: Vascular;  Laterality: Right;  . FEMORAL-POPLITEAL BYPASS GRAFT Left 12/01/2017   Procedure: BYPASS GRAFT FEMORAL- ABOVE KNEE POPLITEAL ARTERY WITH VEIN;  Surgeon: Rosetta Posner, MD;  Location: [place];  Service: Vascular;  Laterality: Left;  . INTRAOPERATIVE ARTERIOGRAM Right 11/15/2017   Procedure: INTRA OPERATIVE ARTERIOGRAM;  Surgeon: Angelia Mould, MD;  Location: [place];  Service: Vascular;  Laterality: Right;  . INTRAOPERATIVE ARTERIOGRAM Left 12/01/2017   Procedure: INTRA OPERATIVE ARTERIOGRAM OF LEFT LEG;  Surgeon: Rosetta Posner, MD;  Location: [place];  Service: Vascular;  Laterality: Left;  . IR US GUIDE BX ASP/DRAIN  12/18/2017  . KNEE ARTHROSCOPY Left X 5     reports that he quit smoking about 3 months ago. His smoking use included cigarettes. He started smoking about 3 months ago.  He has a 24.00 pack-year smoking history. He has never used smokeless tobacco. He reports that he drank alcohol. He reports that he has current or past drug history. Drug: Marijuana.  Allergies  Allergen Reactions  . Pork-Derived Products Other (See Comments)    UNSPECIFIED REASON - Pt doesn't eat pork  . Shellfish-Derived Products Other (See Comments)    Doesn't want to eat  . Morphine And Related Other (See Comments)    Hallucinations    Family History  Problem Relation Age of Onset  . Hypertension Mother   . Heart disease Mother   . Hypertension Father   . Coronary artery disease Father 72  . Heart attack Brother 83  . Coronary artery disease Brother   . Heart disease Brother   . Diabetes Neg Hx   . Stroke Neg Hx     Prior to Admission medications   Medication Sig Start Date End Date Taking? Authorizing Provider  albuterol (PROVENTIL) (2.5 MG/3ML) 0.083% nebulizer solution Take 3 mLs (2.5 mg total) by nebulization every 4 (four) hours as needed for wheezing or shortness of breath. 03/01/18  Yes Hall, Carole N, DO  amLODipine (NORVASC) 5 MG tablet Take 1 tablet (5 mg total) by mouth daily. 03/02/18  Yes Kayleen Memos, DO  chlorhexidine gluconate, MEDLINE KIT, (PERIDEX) 0.12 % solution 15 mLs by Mouth Rinse route 2 (two) times daily. 03/01/18  Yes Hall, Carole N, DO  dexamethasone (DECADRON) 4 MG tablet Take 1 tablet (4 mg total) by mouth daily. 02/22/18 03/24/18 Yes Acquanetta Chain, DO  dronabinol (MARINOL) 5 MG capsule Take 1 capsule (5 mg total) by mouth 2 (two) times daily before lunch and supper. 03/13/18 04/12/18 Yes Brunetta Genera, MD  DULoxetine (CYMBALTA) 60 MG capsule Take 1 capsule (60 mg total) by mouth daily. 02/14/18 03/16/18 Yes [name] L, DO  ferrous sulfate 325 (65 FE) MG tablet Take 1 tablet (325 mg total) by mouth daily with breakfast. 03/01/18  Yes Irene Pap N, DO  fluconazole (DIFLUCAN) 100 MG tablet Take 1 tablet (100 mg total) by mouth daily.  03/01/18 03/31/18 Yes Kayleen Memos, DO  fondaparinux (ARIXTRA) 7.5 MG/0.6ML SOLN injection Inject 0.6 mLs (7.5 mg total) into the skin daily at 6 PM for 26 days. 02/14/18 03/30/2018 Yes Acquanetta Chain, DO  gabapentin (NEURONTIN) 400 MG capsule Take 1 capsule (400 mg total) by mouth 2 (two) times daily. 02/22/18  Yes [name] L, DO  HYDROmorphone (DILAUDID) 8 MG tablet Take 1 tablet (8 mg total) by mouth every 2 (two) hours as needed for severe pain. 03/01/18  Yes Hall, Carole N, DO  LORazepam (ATIVAN) 1 MG tablet Take 0.5 tablets (0.5 mg total) by mouth 2 (two) times daily AND 1 tablet (1 mg total) at bedtime. Patient taking differently: 1 mg at bedtime only 02/22/18 03/24/18 Yes Acquanetta Chain, DO  magic mouthwash SOLN Take 5-10 mLs by mouth 2 (two) times daily. 03/07/18  Yes [provider]  methadone (DOLOPHINE) 10 MG tablet Take 2 tablets (20 mg total) by  mouth every 8 (eight) hours. 02/14/18 03/16/18 Yes Acquanetta Chain, DO  methocarbamol (ROBAXIN) 500 MG tablet Take 1 tablet (500 mg total) by mouth 2 (two) times daily. 03/01/18  Yes Kayleen Memos, DO  Multiple Vitamin (MULTIVITAMIN WITH MINERALS) TABS tablet Take 1 tablet by mouth daily. 03/02/18  Yes Hall, Carole N, DO  NARCAN 4 MG/0.1ML LIQD nasal spray kit Place 1 spray into the nose daily as needed. Overdose of medication 01/23/18  Yes [provider]  OLANZapine (ZYPREXA) 5 MG tablet Take 1 tablet (5 mg total) by mouth at bedtime. 02/14/18 03/16/18 Yes [name] L, DO  ondansetron (ZOFRAN) 8 MG tablet Take 1 tablet (8 mg total) by mouth 2 (two) times daily as needed for refractory nausea / vomiting. Start on day 3 after chemo. 02/14/18  Yes [name] L, DO  pantoprazole (PROTONIX) 40 MG tablet Take 1 tablet (40 mg total) by mouth daily. 02/22/18 03/24/18 Yes [name] L, DO  polyethylene glycol (MIRALAX / GLYCOLAX) packet Take 17 g by mouth daily. 01/24/18  Yes Patrecia Pour, Christean Grief, MD    povidone-iodine (BETADINE) 10 % external solution Apply topically as needed for wound care. Patient taking differently: Apply 1 application topically 3 (three) times daily as needed for wound care.  01/23/18  Yes [name] L, DO  senna-docusate (SENOKOT-S) 8.6-50 MG tablet Take 2 tablets by mouth 2 (two) times daily. 01/23/18  Yes Patrecia Pour, Christean Grief, MD  sulfamethoxazole-trimethoprim (BACTRIM DS,SEPTRA DS) 800-160 MG tablet Take 1 tablet by mouth daily. 03/01/18 03/31/18 Yes Kayleen Memos, DO  triamcinolone cream (KENALOG) 0.1 % Apply topically 2 (two) times daily. 03/01/18  Yes Kayleen Memos, DO    Physical Exam: Vitals:   03/15/18 0030 03/15/18 0100 03/15/18 0130 03/15/18 0200  BP: (!) 158/109 (!) 152/133 (!) 149/109 (!) 140/106  Pulse:   (!) 151 (!) 149  Resp: (!) 45 (!) 40 (!) 34 (!) 22  Temp:      TempSrc:      SpO2:   90% (!) 87%  Weight:      Height:          Constitutional: Moderately built and nourished. Vitals:   03/15/18 0030 03/15/18 0100 03/15/18 0130 03/15/18 0200  BP: (!) 158/109 (!) 152/133 (!) 149/109 (!) 140/106  Pulse:   (!) 151 (!) 149  Resp: (!) 45 (!) 40 (!) 34 (!) 22  Temp:      TempSrc:      SpO2:   90% (!) 87%  Weight:      Height:       Eyes: Anicteric no pallor. ENMT: No discharge from the ears eyes nose or mouth. Neck: No mass felt.  No neck rigidity. Respiratory: No rhonchi or crepitations. Cardiovascular: S1-S2 heard no murmurs appreciated.  Tachycardic. Abdomen: Soft mild tenderness no rebound tenderness bowel sounds not appreciated. Musculoskeletal: Right leg has gangrenous toes. Skin: Right lower extremity has gangrenous toes. Neurologic: Patient is alert but mildly confused moves all extremities. Psychiatric: Mildly confused.   Labs on Admission: I have personally reviewed following labs and imaging studies  CBC: Recent Labs  Lab 03/13/18 0834 03/15/2018 2108 03/23/2018 2122  WBC 13.0*  --  12.1*  NEUTROABS 12.1*  --  11.0   HGB 10.1* 16.0 14.4  HCT 31.2* 47.0 44.9  MCV 86.9  --  89.6  PLT 292  --  734   Basic Metabolic Panel: Recent Labs  Lab 03/13/18 0834 03/31/2018 2108 03/23/2018 2122  NA  138 137 144  K 4.7 4.3 4.5  CL 100 98 100  CO2 28  --  24  GLUCOSE 90 208* 204*  BUN 16 26* 28*  CREATININE 0.63 0.80 1.01  CALCIUM 9.8  --  9.4  MG 1.8  --   --    GFR: Estimated Creatinine Clearance: 90.8 mL/min (by C-G formula based on SCr of 1.01 mg/dL). Liver Function Tests: Recent Labs  Lab 03/13/18 0834 04/11/2018 2122  AST 23 24  ALT 20 20  ALKPHOS 114 128*  BILITOT <0.2* 0.5  PROT 7.0 7.0  ALBUMIN 2.5* 2.5*   Recent Labs  Lab 04/09/2018 2122  LIPASE 21   No results for input(s): AMMONIA in the last 168 hours. Coagulation Profile: No results for input(s): INR, PROTIME in the last 168 hours. Cardiac Enzymes: No results for input(s): CKTOTAL, CKMB, CKMBINDEX, TROPONINI in the last 168 hours. BNP (last 3 results) No results for input(s): PROBNP in the last 8760 hours. HbA1C: No results for input(s): HGBA1C in the last 72 hours. CBG: No results for input(s): GLUCAP in the last 168 hours. Lipid Profile: No results for input(s): CHOL, HDL, LDLCALC, TRIG, CHOLHDL, LDLDIRECT in the last 72 hours. Thyroid Function Tests: Recent Labs    03/29/2018 2122  TSH 3.712   Anemia Panel: No results for input(s): VITAMINB12, FOLATE, FERRITIN, TIBC, IRON, RETICCTPCT in the last 72 hours. Urine analysis:    Component Value Date/Time   COLORURINE YELLOW 03/27/2018 2122   APPEARANCEUR CLEAR 04/09/2018 2122   LABSPEC >1.046 (H) 04/09/2018 2122   PHURINE 6.0 03/28/2018 2122   GLUCOSEU NEGATIVE 03/27/2018 2122   HGBUR NEGATIVE 03/18/2018 2122   HGBUR negative 07/26/2009 [place] 03/28/2018 2122   KETONESUR NEGATIVE 03/31/2018 2122   PROTEINUR NEGATIVE 03/13/2018 2122   UROBILINOGEN 0.2 08/21/2011 1911   NITRITE NEGATIVE 03/17/2018 2122   LEUKOCYTESUR NEGATIVE 03/17/2018 2122    Sepsis Labs: _0 (procalcitonin:4,lacticidven:4) )No results found for this or any previous visit (from the past 240 hour(s)).   Radiological Exams on Admission: Ct Abdomen Pelvis W Contrast  Addendum Date: 03/25/2018   ADDENDUM REPORT: 03/20/2018 22:46 ADDENDUM: Acute findings discussed with and reconfirmed by Dr.DAN FLOYD on 03/30/2018 at 10:46 pm. Electronically Signed   By: Elon Alas M.D.   On: 03/27/2018 22:46   Result Date: 04/06/2018 CLINICAL DATA:  Abdominal pain. History of stage IV lung cancer, aortofemoral bypass. EXAM: CT ABDOMEN AND PELVIS WITH CONTRAST TECHNIQUE: Multidetector CT imaging of the abdomen and pelvis was performed using the standard protocol following bolus administration of intravenous contrast. CONTRAST:  122m ISOVUE-300 IOPAMIDOL (ISOVUE-300) INJECTION 61% COMPARISON:  CT abdomen and pelvis December 24, 2017 FINDINGS: LOWER CHEST: Resolution of pleural effusions. Included heart size is normal. Bulky LEFT chest wall adenopathy new from prior CT. HEPATOBILIARY: Liver and gallbladder are normal. PANCREAS: Normal. SPLEEN: Normal. ADRENALS/URINARY TRACT: Kidneys are orthotopic, demonstrating symmetric enhancement. No nephrolithiasis, hydronephrosis or solid renal masses. The unopacified ureters are normal in course and caliber. Delayed imaging through the kidneys demonstrates symmetric prompt contrast excretion within the proximal urinary collecting system. Urinary bladder is partially distended and unremarkable. Stable nonspecific 12 mm LEFT adrenal nodule. STOMACH/BOWEL: Small bowel dilated to 3.2 cm with multiple loose edematous bowel. Multiple small bowel transition points all localized within the LEFT central mid abdomen. No pneumatosis. Mild amount of retained large bowel stool. Normal appendix. VASCULAR/LYMPHATIC: Aortofemoral bypass. Patent proximal SMA and celiac axis, limited assessment by non angiographic phase. Patent bypass though not tailored  for  evaluation. No lymphadenopathy by CT size criteria. REPRODUCTIVE: Normal. OTHER: Moderate amount of low-density ascites. No intraperitoneal free. No central mesenteric mass. MUSCULOSKELETAL: Nonacute. Surgical clips bilateral inguinal soft tissues consistent with prior vascular access. Anterior abdominal subcutaneous emphysema, possibly from injection sites. IMPRESSION: 1. Small-bowel obstruction with multiple central transition points, this can be seen with adhesions or internal hernia. Edematous small bowel without pneumatosis. 2. Moderate ascites. 3. New bulky LEFT chest wall lymphadenopathy consistent with worsening metastatic disease. 4. Status post aorta by femoral bypass, patent vessels by non angiographic CT. Aortic Atherosclerosis (ICD10-I70.0). Electronically Signed: By: Elon Alas M.D. On: 03/25/2018 22:42   Dg Chest Port 1 View  Result Date: 03/29/2018 CLINICAL DATA:  Sepsis, bulky adenopathy, suspect metastatic lung cancer EXAM: PORTABLE CHEST 1 VIEW COMPARISON:  02/25/2018, 01/07/2018 FINDINGS: Bulky right paratracheal adenopathy again noted. Normal heart size. Lower lung volumes with increased basilar atelectasis. No large effusion or pneumothorax. Soft tissue prominence in the clavicular regions compatible with known adenopathy. No acute osseous finding. IMPRESSION: Lower lung volumes with mild vascular congestion and basilar atelectasis. Grossly stable adenopathy. Electronically Signed   By: Jerilynn Mages.  Shick M.D.   On: 04/07/2018 21:44    EKG: Independently reviewed.  EKG is read as SVT but patient's monitor shows sinus tachycardia heart rate around 140.  Assessment/Plan Principal Problem:   SBO (small bowel obstruction) (HCC) Active Problems:   SMOKER   Depression with anxiety   Essential hypertension   PVD (peripheral vascular disease) (HCC)   Pain of right lower extremity due to ischemia   S/P aortobifemoral bypass surgery   Cervical lymphadenopathy   DVT (deep venous  thrombosis) (HCC)   Malignant neoplasm of upper lobe of right lung (HCC)   SVC syndrome   Malnutrition of moderate degree   Mediastinal adenopathy with near SVC syndrome   On antineoplastic chemotherapy   Cancer-related pain   Ascites   Protein-calorie malnutrition, moderate (HCC)   Hyperglycemia   Metastatic lung cancer to axillary lymph nodes    Chronic anticoagulation (Fondaparinux)    1. Small bowel obstruction -discussed with on-call general surgeon Dr. Johney Maine.  Patient did not cooperate to place NG tube.  Patient has significant abdominal pain and has been placed on Dilaudid for pain medications.  Continue with IV fluids will be kept n.p.o. and off anticoagulation in anticipation of possible surgery. 2. SIRS picture likely from abdominal pain and intra-abdominal cause.  For now empirically placed on antibiotics.  Follow lactate level continue with hydration. 3. History of DVT -patient's last dose of Fondaparinux was more than 24 hours ago.  Discussed with surgeon at this time in anticipation of possible emergency surgery frequent holding off any anticoagulation.  If no surgery is planned probably will restart anticoagulation. 4. Metastatic adenocarcinoma of the lung will need further input from oncology patient oncologist is [place]. 5. Hypertension we will keep patient on PRN IV labetalol. 6. History of SVC syndrome. 7. Peripheral vascular disease with right foot gangrene and has had bypass surgery and thrombectomy. 8. Tachycardia likely related to pain for which patient has been placed on pain relief medication.   DVT prophylaxis: SCDs. Code Status: Full code. Family Communication: Patient's wife. Disposition Plan: To be determined. Consults called: General surgery.  Palliative care. Admission status: Inpatient.   Rise Patience MD [facility] Pager [phone].  If 7PM-7AM, please contact night-coverage [email or web address] Password [email or web address]  03/15/2018, 2:22 AM

## 2018-03-16 DIAGNOSIS — Z95828 Presence of other vascular implants and grafts: Secondary | ICD-10-CM

## 2018-03-16 DIAGNOSIS — I82621 Acute embolism and thrombosis of deep veins of right upper extremity: Secondary | ICD-10-CM

## 2018-03-16 DIAGNOSIS — E440 Moderate protein-calorie malnutrition: Secondary | ICD-10-CM

## 2018-03-16 DIAGNOSIS — I998 Other disorder of circulatory system: Secondary | ICD-10-CM

## 2018-03-16 DIAGNOSIS — R188 Other ascites: Secondary | ICD-10-CM

## 2018-03-16 DIAGNOSIS — R739 Hyperglycemia, unspecified: Secondary | ICD-10-CM

## 2018-03-16 DIAGNOSIS — F418 Other specified anxiety disorders: Secondary | ICD-10-CM

## 2018-03-16 DIAGNOSIS — I739 Peripheral vascular disease, unspecified: Secondary | ICD-10-CM

## 2018-03-16 LAB — URINE CULTURE: Culture: NO GROWTH

## 2018-03-16 LAB — T4: T4, Total: 6.9 ug/dL (ref 4.5–12.0)

## 2018-03-16 MED ORDER — GLYCOPYRROLATE 0.2 MG/ML IJ SOLN
0.2000 mg | Freq: Three times a day (TID) | INTRAMUSCULAR | Status: DC
  Administered 2018-03-16 – 2018-03-21 (×17): 0.2 mg via INTRAVENOUS
  Filled 2018-03-16 (×17): qty 1

## 2018-03-16 MED ORDER — HYDROMORPHONE BOLUS VIA INFUSION
2.0000 mg | INTRAVENOUS | Status: DC | PRN
  Administered 2018-03-16: 4 mg via INTRAVENOUS
  Administered 2018-03-16: 2 mg via INTRAVENOUS
  Administered 2018-03-17 (×3): 4 mg via INTRAVENOUS
  Administered 2018-03-17 (×3): 2 mg via INTRAVENOUS
  Administered 2018-03-18: 4 mg via INTRAVENOUS
  Administered 2018-03-18: 2 mg via INTRAVENOUS
  Administered 2018-03-18: 4 mg via INTRAVENOUS
  Administered 2018-03-19: 2 mg via INTRAVENOUS
  Administered 2018-03-19 – 2018-03-20 (×2): 4 mg via INTRAVENOUS
  Administered 2018-03-20: 2 mg via INTRAVENOUS
  Administered 2018-03-20 – 2018-03-21 (×5): 4 mg via INTRAVENOUS
  Filled 2018-03-16: qty 4

## 2018-03-16 MED ORDER — GLYCOPYRROLATE 0.2 MG/ML IJ SOLN
0.4000 mg | Freq: Once | INTRAMUSCULAR | Status: AC
  Administered 2018-03-16: 0.4 mg via INTRAVENOUS
  Filled 2018-03-16: qty 2

## 2018-03-16 NOTE — Progress Notes (Signed)
Daily Progress Note   Patient Name: Tony [name]       Date: 03/16/2018 DOB: 11-05-1965  Age: 52 y.o. MRN#: [id number] Attending Physician: Patrecia Pour, MD Primary Care Physician: [name], MD Admit Date: 04/09/2018  Reason for Consultation/Follow-up: Establishing goals of care, Pain control and Terminal Care  Subjective: Tony [name] is unresponsive and family is gathered at bedside.   Length of Stay: 2  Current Medications: Scheduled Meds:  . bisacodyl  10 mg Rectal Daily  . lip balm  1 application Topical BID    Continuous Infusions: . sodium chloride 250 mL (03/15/18 1655)  . HYDROmorphone 1 mg/hr (03/16/18 1045)  . LORazepam (ATIVAN) infusion 5 mg/hr (03/16/18 1359)    PRN Meds: sodium chloride, acetaminophen, acetaminophen **OR** acetaminophen, albuterol, alum & mag hydroxide-simeth, hydrocortisone, hydrocortisone cream, HYDROmorphone, LORazepam, LORazepam, magic mouthwash, ondansetron **OR** ondansetron (ZOFRAN) IV, phenol  Physical Exam  Constitutional: Tony [name] has a sickly appearance.  Cardiovascular: Tachycardia present.  Pulmonary/Chest: No accessory muscle usage. No tachypnea. No respiratory distress. Tony [name] has decreased breath sounds. Tony [name] has rhonchi.  Secretions becoming audible  Abdominal: Normal appearance.  Neurological: Tony [name] is unresponsive.  Nursing note and vitals reviewed.           Vital Signs: BP (!) 177/96   Pulse (!) 128   Temp 98.3 F (36.8 C) (Axillary)   Resp 17   Ht 6' (1.829 m)   Wt 72.5 kg   SpO2 (!) 83%   BMI 21.68 kg/m  SpO2: SpO2: (!) 83 % O2 Device: O2 Device: Room Air(Patient removed [place]; will place when able) O2 Flow Rate: O2 Flow Rate (L/min): 2 L/min  Intake/output summary:   Intake/Output Summary (Last 24 hours) at 03/16/2018  1403 Last data filed at 03/16/2018 1100 Gross per 24 hour  Intake 70 ml  Output 475 ml  Net -405 ml   LBM: Last BM Date: (PTA) Baseline Weight: Weight: 72.6 kg Most recent weight: Weight: 72.5 kg       Palliative Assessment/Data: 10%     Patient Active Problem List   Diagnosis Date Noted  . Mediastinal adenopathy with near SVC syndrome 03/17/2018  . On antineoplastic chemotherapy 03/26/2018  . Cancer-related pain 03/22/2018  . Ascites 04/08/2018  . Protein-calorie malnutrition, moderate ([place]) 04/05/2018  . Hyperglycemia 03/17/2018  . Metastatic lung  cancer to axillary lymph nodes  03/23/2018  . SBO (small bowel obstruction) ([place]) 03/24/2018  . Chronic anticoagulation (Fondaparinux) 03/18/2018  . Malnutrition of moderate degree 02/28/2018  . Cellulitis 02/26/2018  . Malignant neoplasm metastatic to lung ([place])   . Dehydration 02/25/2018  . Sepsis ([place]) 02/25/2018  . Thrush of mouth and esophagus ([place]) 01/16/2018  . Counseling regarding advance care planning and goals of care 01/01/2018  . Adenocarcinoma ([place])   . SVC syndrome 12/31/2017  . Adenocarcinoma of left lung, stage 4 ([place]) 12/31/2017  . Mass in neck   . Neck pain due to malignant neoplastic disease ([place])   . Palliative care by specialist   . Uncontrolled pain   . Palliative care encounter   . Malignant neoplasm of upper lobe of right lung ([place]) 12/25/2017  . Gangrene of right foot ([place]) 12/20/2017  . AKI (acute kidney injury) ([place]) 12/20/2017  . PAD (peripheral artery disease) ([place]) 12/19/2017  . S/P aortobifemoral bypass surgery 12/17/2017  . Postoperative wound infection 12/17/2017  . Weight loss 12/17/2017  . Cervical lymphadenopathy 12/17/2017  . DVT (deep venous thrombosis) ([place]) 12/17/2017  . Aortoiliac occlusive disease ([place]) 11/15/2017  . Pain of right lower extremity due to ischemia 11/13/2017  . Hyperlipidemia LDL goal <70 11/13/2017  . PVD (peripheral vascular disease) ([place]) 11/11/2017  .  Chronic back pain 11/04/2017  . H. pylori infection 10/11/2011  . Duodenal ulcer 08/23/2011  . IBS (irritable bowel syndrome) 08/18/2011  . Depression with anxiety 05/11/2009  . EPIGASTRIC PAIN, CHRONIC 05/11/2009  . SMOKER 09/01/2008  . INSOMNIA, CHRONIC 09/01/2008  . Essential hypertension 09/01/2008  . PALPITATIONS, CHRONIC 09/01/2008  . GASTRITIS 10/24/2007    Palliative Care Assessment & Plan   HPI: Tony [name] is well known to palliative care team. Tony [name] has struggled with severe symptoms of pain and anxiety r/t stage IV lung cancer with complication of recent sepsis cellulitis and now SBO. Tony [name] completed radiation therapy and did very poorly with chemotherapy. Tony [name] is now comfort care at Tony end of his life.   Assessment: Tony [name] is well known to me from past admissions. I am saddened to see him in this state. Family has gathered at [facility] bedside and are holding vigil. Wife Tony [name] is not currently at bedside. When I enter room Tony [name] appears restless and is moving around in bed but is not responsive, opening eyes, or attempting to verbalize. I worked with Therapist, sports to increase medication to better provide comfort.   Tony [name] reports wife struggling with pain medication and fearful that we are oversedating Tony [name] into a comatose state. They are struggling with not increasing oxygen and some of Tony details of comfort care.   I called and spoke with Tony [name] today. She shared her concerns with placing Tony [name] "in a comatose state." She felt like Tony [name] was trying to communicate or awaken but was unable to with Tony medication. I explained to her that I fear that if Tony [name] is trying to awaken and communicate that Tony [name] would be unable to express himself in his current state and that this will only lead to more anxiety and distress. I actually recommended increasing Tony medication at this time given my assessment. Tony [name] understands and agrees. I expressed my sincere sorrow for her and her family. We discussed all Tony pain, anxiety,  and suffering that Tony [name] has gone through on his journey and how Tony [name] deserves to finally be comfortable. She is appreciative of palliative services and for being there for him "from Tony beginning." We  will continue to follow and offer support.   Recommendations/Plan:  Pain/SOB: Hydromorphone infusion increased from 1 mg/hr to 2 mg/hr. Appears that it was increased at some point overnight but then decreased today per family request. Bolus 2-4 mg available every 15 min prn.   Anxiety: Ativan infusion has been titrated by Tony [name] to 5 mg/hr. Bolus 2-5 mg every 15 min prn.   Secretions: Robinul 0.4 mg IV x 1. Robinul 0.2 mg IV TID.   Family requires ongoing reassurance and support.   Goals of Care and Additional Recommendations:  Limitations on Scope of Treatment: Full Comfort Care  Code Status:  DNR  Prognosis:   Hours - Days  Discharge Planning:  Anticipated Hospital Death   Thank you for allowing Tony Palliative Medicine Team to assist in Tony care of this patient.   Total Time 35 min Prolonged Time Billed  no       Greater than 50%  of this time was spent counseling and coordinating care related to Tony above assessment and plan.  Vinie Sill, NP Palliative Medicine Team Pager # [phone] (M-F 8a-5p) Team Phone # [phone] (Nights/Weekends)

## 2018-03-16 NOTE — Progress Notes (Signed)
PROGRESS NOTE  Brief Narrative: Tony [name] is a 52 y.o. male with a history of stage IV lung CA s/p XRT recently receiving palliative chemotherapy, recent admission for right chest cellulitis, PVD s/p LE bypass, thrombectomy with dry gangrene to toes of right foot, and DVT, SVC syndrome, and HTN who presented to the ED due to abrupt severe abdominal pain, vomiting found to be septic with lactate 5.91 and evidence of SBO with transition point on CT. General surgery consulted and empiric antibiotics started. He has been persistently tachycardic, hyPERtensive, and developing progressive delirium complicated by tachypnea and hypoxia. The patient is not a surgical candidate and after goals of care discussions with pulmonary/critical care medicine team and palliative care team, the patient's wife decided to pursue comfort measures only on 10/4. Opiate and anxiolytic infusions were started with improvement in agitation/delirium and pain.   Subjective: Patient is resting quietly does not appear to be in pain. Wife at bedside states he is more calm. She wishes not to change rooms. Family has been by to see patient.  Objective: BP (!) 177/96   Pulse (!) 128   Temp 98.3 F (36.8 C) (Axillary)   Resp 17   Ht 6' (1.829 m)   Wt 72.5 kg   SpO2 (!) 83%   BMI 21.68 kg/m   Gen: Ill-appearing male in no distress Pulm: Nonlabored with supplemental oxygen, rhonchorous diffusely. No longer tachypneic. CV: Regular tachycardia. No murmur, rub, or gallop. No JVD, no dependent edema. GI: Abdomen distended, not aggressively examined due to suspected tenderness.  Ext: Cool and dry, stable dry gangrene of right toes.  Skin: Stable postradiation changes in right upper chest. No new rashes, lesions or ulcers on visualized skin.  Neuro: Sleeping, not meaningfully responsive. Psych: UTD  Assessment & Plan: SBO: With acute abdomen. Unable to place NG tube. Not a surgical candidate.  - Comfort measures started 10/4  in light of no curative treatment options. Continue pain control with dilaudid gtt and prn dosing, seems to be well controlled.  - Octreotide, steroids also given  Anxiety, agitation:  - Continue ativan gtt, titrated upwards and stable.   SIRS with acute hypoxic respiratory failure:  - Supportive measures as above, continue oxygen for dyspnea. On opioid for dyspnea.  - DNR confirmed by CCM  DVT: Holding anticoagulation in light of terminal care  PVD s/p aortobifemoral bypass with right toes dry gangrene: Noted  Metastatic adenocarcinoma of the lung s/p XRT: CT scans show evidence of disease progression, under care of palliative care as outpatient.  Patrecia Pour, MD 03/16/2018, 12:26 PM

## 2018-03-17 DIAGNOSIS — Z515 Encounter for palliative care: Secondary | ICD-10-CM

## 2018-03-17 DIAGNOSIS — Z7189 Other specified counseling: Secondary | ICD-10-CM

## 2018-03-17 NOTE — Progress Notes (Signed)
Temp of 101.3 axillary. Pt.s wife will like to try non-pharm measures such as cooling room, and placing ice packs on patient before giving patient a suppository.

## 2018-03-17 NOTE — Progress Notes (Signed)
Daily Progress Note   Patient Name: Tony Tony [name]       Date: 03/17/2018 DOB: Mar 17, 1966  Age: 52 y.o. MRN#: [id number] Attending Physician: Tony Pour, MD Primary Care Physician: Tony Amel, MD Admit Date: 04/06/2018  Reason for Consultation/Follow-up: Establishing goals of care, Tony [name] control and Terminal Care  Subjective: Tony Tony [name] and family is gathered at bedside.  I met with the patient's wife, cousin and other family members who were present at the bedside.  Discussed with bedside RN. Overnight events noted. Patient remains on continuous infusions of Dilaudid as well as Ativan. Patient has required a few bolus dosages of both these medications throughout the night. Additionally, he has had noisy breathing and coarse breath sounds and was medicated with Robinul on an as-needed basis for secretions.    Length of Stay: 3  Current Medications: Scheduled Meds:  . bisacodyl  10 mg Rectal Daily  . glycopyrrolate  0.2 mg Intravenous TID  . lip balm  1 application Topical BID    Continuous Infusions: . sodium chloride 250 mL (03/15/18 1655)  . HYDROmorphone 2 mg/hr (03/17/18 0657)  . LORazepam (ATIVAN) infusion 5 mg/hr (03/16/18 2014)    PRN Meds: sodium chloride, acetaminophen, albuterol, alum & mag hydroxide-simeth, hydrocortisone, hydrocortisone cream, HYDROmorphone, LORazepam, magic mouthwash, ondansetron **OR** ondansetron (ZOFRAN) IV, phenol            Vital Signs: BP 131/76   Pulse (!) 124   Temp 98.2 F (36.8 C) (Axillary)   Resp 16   Ht 6' (1.829 m)   Wt 72.5 kg   SpO2 (!) 84%   BMI 21.68 kg/m  SpO2: SpO2: (!) 84 % O2 Device: O2 Device: Nasal Cannula O2 Flow Rate: O2 Flow Rate (L/min): 4 L/min Patient is Tony [name] Has mild diffuse  generalized edema Noisy breathing, coarse breath sounds Patient is not displaying nonverbal gestures of distress or discomfort - he does not have been Tony Tony [name] he does not have grimacing he does not have restless movements he does not have clenching of the jaw he does not have furrowing of his brow S1-S2 Abdomen is non distended Extremities are still warm to touch, there is no coolness there is no mottling noted   Intake/output summary:   Intake/Output Summary (Last 24 hours) at 03/17/2018 1107 Last data filed at  03/16/2018 2000 Gross per 24 hour  Intake 130 ml  Output 600 ml  Net -470 ml   LBM: Last BM Date: (PTA) Baseline Weight: Weight: 72.6 kg Most recent weight: Weight: 72.5 kg       Palliative Assessment/Data: 10%     Patient Active Problem List   Diagnosis Date Noted  . Mediastinal adenopathy with near SVC syndrome 04/01/2018  . On antineoplastic chemotherapy 03/19/2018  . Cancer-related Tony [name] 04/05/2018  . Ascites 03/26/2018  . Protein-calorie malnutrition, moderate ([place]) 03/26/2018  . Hyperglycemia 03/15/2018  . Metastatic lung cancer to axillary lymph nodes  03/29/2018  . SBO (small bowel obstruction) ([place]) 04/08/2018  . Chronic anticoagulation (Fondaparinux) 04/01/2018  . Malnutrition of moderate degree 02/28/2018  . Cellulitis 02/26/2018  . Malignant neoplasm metastatic to lung ([place])   . Dehydration 02/25/2018  . Sepsis ([place]) 02/25/2018  . Thrush of mouth and esophagus ([place]) 01/16/2018  . Counseling regarding advance care planning and goals of care 01/01/2018  . Adenocarcinoma ([place])   . SVC syndrome 12/31/2017  . Adenocarcinoma of left lung, stage 4 ([place]) 12/31/2017  . Mass in neck   . Neck Tony [name] due to malignant neoplastic disease ([place])   . Palliative care by specialist   . Uncontrolled Tony [name]   . Palliative care encounter   . Malignant neoplasm of upper lobe of right lung ([place]) 12/25/2017  . Gangrene of right foot ([place]) 12/20/2017  . AKI (acute kidney  injury) ([place]) 12/20/2017  . PAD (peripheral artery disease) ([place]) 12/19/2017  . S/P aortobifemoral bypass surgery 12/17/2017  . Postoperative wound infection 12/17/2017  . Weight loss 12/17/2017  . Cervical lymphadenopathy 12/17/2017  . DVT (deep venous thrombosis) ([place]) 12/17/2017  . Aortoiliac occlusive disease ([place]) 11/15/2017  . Tony [name] of right lower extremity due to ischemia 11/13/2017  . Hyperlipidemia LDL goal <70 11/13/2017  . PVD (peripheral vascular disease) ([place]) 11/11/2017  . Chronic back Tony [name] 11/04/2017  . H. pylori infection 10/11/2011  . Duodenal ulcer 08/23/2011  . IBS (irritable bowel syndrome) 08/18/2011  . Depression with anxiety 05/11/2009  . EPIGASTRIC Tony [name], CHRONIC 05/11/2009  . SMOKER 09/01/2008  . INSOMNIA, CHRONIC 09/01/2008  . Essential hypertension 09/01/2008  . PALPITATIONS, CHRONIC 09/01/2008  . GASTRITIS 10/24/2007    Palliative Care Assessment & Plan   HPI: Tony Tony [name] is well known to palliative care team. He has struggled with severe symptoms of Tony [name] and anxiety r/t stage IV lung cancer with complication of recent sepsis cellulitis and now SBO. He completed radiation therapy and did very poorly with chemotherapy. He is now comfort care at the end of his life.   Assessment: Tony Tony [name] appears to be actively dying but comfortable. Wife Tony Tony [name] is present at the bedside. Is appropriately maintained on current comfort measures. Prognosis remains a shortness few hours to as long as few days.  Family is aware of the patient actively dying, family is aware of above-mentioned prognosis. Anticipated hospital death. 09/05/2022 by the patient's bedside and discussed with wife about end-of-life signs and symptoms. Offered presence, support and validated her emotions. She is thankful for the care the patient is receiving here in the hospital, she is thankful to the palliative medicine team especially my colleagues Ms. Tony Pain, NP and Dr. Hilma Tony [name].  Continue current comfort measures.      Recommendations/Plan:  Tony [name]/SOB: Hydromorphone infusion currently at formalin grams an hour. Goal is full comfort. Bolus dosages also available.  Anxiety: Ativan infusion  5 mg/hr. Bolus dosages also available.   Secretions: Robinul 0.4 mg  IV x 1. Robinul 0.2 mg IV TID.   Family requires ongoing reassurance and support.   Goals of Care and Additional Recommendations:  Limitations on Scope of Treatment: Full Comfort Care  Code Status:  DNR  Prognosis:   Hours - Days  Discharge Planning:  Anticipated Hospital Death   Thank you for allowing the Palliative Medicine Team to assist in the care of this patient.   Total Time 35 min Prolonged Time Billed  no       Greater than 50%  of this time was spent counseling and coordinating care related to the above assessment and plan.  Loistine Chance MD [place] team [phone] Team Phone # [phone] (Nights/Weekends)

## 2018-03-17 NOTE — Progress Notes (Signed)
PROGRESS NOTE  Brief Narrative: Tony [name] is a 52 y.o. male with a history of stage IV lung CA s/p XRT recently receiving palliative chemotherapy, recent admission for right chest cellulitis, PVD s/p LE bypass, thrombectomy with dry gangrene to toes of right foot, and DVT, SVC syndrome, and HTN who presented to the ED due to abrupt severe abdominal pain, vomiting found to be septic with lactate 5.91 and evidence of SBO with transition point on CT. General surgery consulted and empiric antibiotics started. He has been persistently tachycardic, hyPERtensive, and developing progressive delirium complicated by tachypnea and hypoxia. The patient is not a surgical candidate and after goals of care discussions with pulmonary/critical care medicine team and palliative care team, the patient's wife decided to pursue comfort measures only on 10/4. Opiate and anxiolytic infusions were started with improvement in agitation/delirium and pain.   Subjective: No evidence of pain/distress, though was increasing secretions yesterday this was improved with robinul. Wife at bedside is doing "wonderful."  Objective: BP 116/76   Pulse (!) 128   Temp 98.2 F (36.8 C) (Axillary)   Resp 17   Ht 6' (1.829 m)   Wt 72.5 kg   SpO2 (!) 86%   BMI 21.68 kg/m   Gen: Acutely ill nonresponsive male in no distress Pulm: Tachypnea without increased effort, mid 80%'s on monitor. Coarse. CV: Regular rapid. No murmur, rub, or gallop. No JVD, diffuse dependent edema. GI: Distended with hypoactive sounds, no grimace to palpation. Ext: Warm, no deformities Skin: No new rashes, lesions or ulcers on visualized skin.  Neuro: No meaningful response. Psych: UTD  Assessment & Plan: SBO: With acute abdomen. Unable to place NG tube. Not a surgical candidate.  - Comfort measures started 10/4 in light of no curative treatment options. Appreciate palliative care emotional support and medication titration. Coming up on doses of  dilaudid gtt, getting intermittent boluses.  - Octreotide, steroids were also given  Anxiety, agitation:  - Continue ativan gtt, titrated upwards and stable. Discussed with rn. Continue bolus prn.  SIRS with acute hypoxic respiratory failure:  - Supportive measures as above, continue oxygen for comfort. On opioid for dyspnea.  - DNR confirmed by CCM  DVT: Holding anticoagulation in light of terminal care  PVD s/p aortobifemoral bypass with right toes dry gangrene: Noted  Metastatic adenocarcinoma of the lung s/p XRT: CT scans show evidence of disease progression, under care of palliative care as outpatient.   Patrecia Pour, MD 03/17/2018, 12:09 PM

## 2018-03-18 ENCOUNTER — Ambulatory Visit (HOSPITAL_COMMUNITY): Payer: BLUE CROSS/BLUE SHIELD

## 2018-03-18 ENCOUNTER — Encounter (HOSPITAL_COMMUNITY): Payer: Self-pay

## 2018-03-18 MED ORDER — ACETAMINOPHEN 10 MG/ML IV SOLN
1000.0000 mg | Freq: Four times a day (QID) | INTRAVENOUS | Status: AC | PRN
  Administered 2018-03-18 – 2018-03-19 (×2): 1000 mg via INTRAVENOUS
  Filled 2018-03-18 (×2): qty 100

## 2018-03-18 MED ORDER — ORAL CARE MOUTH RINSE
15.0000 mL | Freq: Two times a day (BID) | OROMUCOSAL | Status: DC
  Administered 2018-03-18 – 2018-03-21 (×6): 15 mL via OROMUCOSAL

## 2018-03-18 NOTE — Progress Notes (Signed)
PROGRESS NOTE  Brief Narrative: Tony [name] is a 52 y.o. male with a history of stage IV lung CA s/p XRT recently receiving palliative chemotherapy, recent admission for right chest cellulitis, PVD s/p LE bypass, thrombectomy with dry gangrene to toes of right foot, and DVT, SVC syndrome, and HTN who presented to the ED due to abrupt severe abdominal pain, vomiting found to be septic with lactate 5.91 and evidence of SBO with transition point on CT. General surgery consulted and empiric antibiotics started. He has been persistently tachycardic, hyPERtensive, and developing progressive delirium complicated by tachypnea and hypoxia. The patient is not a surgical candidate and after goals of care discussions with pulmonary/critical care medicine team and palliative care team, the patient's wife decided to pursue comfort measures only on 10/4. Opiate and anxiolytic infusions were started with improvement in agitation/delirium and pain.   Subjective: Remains stable on gtt's. Requiring robinul and suction frequently/scheduled.   Objective: BP 115/68   Pulse (!) 129   Temp (!) 101.3 F (38.5 C) (Axillary)   Resp 15   Ht 6' (1.829 m)   Wt 72.5 kg   SpO2 (!) 82%   BMI 21.68 kg/m   Gen: Large male appears comfortable Pulm: Nonlabored tachypnea. Coarse. CV: Regular tachycardia. No murmur, rub, or gallop. No JVD, no dependent edema. GI: Abdomen distended  Ext: Warm. Cool feet, no deformities Skin: Left foot gangrene stable. No other rashes, lesions or ulcers on visualized skin.   Assessment & Plan: SBO: With acute abdomen. Unable to place NG tube. Not a surgical candidate.  - Comfort measures started 10/4 in light of no curative treatment options. Appreciate palliative care emotional support and medication titration. On high dose dilaudid and ativan gtt's.  - Tylenol IV prn fever.   Anxiety, agitation:  - Continue ativan gtt, titrated upwards and stable. Discussed with rn. Continue bolus prn.  I believe intensity of nursing care is such that transfer from SDU would be detrimental.   SIRS with acute hypoxic respiratory failure:  - Supportive measures as above, continue oxygen for comfort but should not be titrated upwards. Discussing with pt's wife daily the unnecessary nature of intensive monitoring though she strongly refuses to DC monitoring. On opioid for dyspnea.  - Secretions better with robinul and prn suction - DNR confirmed by CCM  DVT: Holding anticoagulation in light of terminal care  PVD s/p aortobifemoral bypass with right toes dry gangrene: Noted  Metastatic adenocarcinoma of the lung s/p XRT: CT scans show evidence of disease progression, under care of palliative care as outpatient.   Patrecia Pour, MD 03/18/2018, 1:47 PM

## 2018-03-18 NOTE — Progress Notes (Signed)
Palliative Care Follow-up Note  Met with Tyrus's wife and other family at bedside today. They are questioning his status and asking about other treatment options, nutrition and diagnostics. Providers told them he would not live for 24 hours on admission and we are now >72 hours into full comfort care and largely he remains stable on hydromorphone and ativan drips. He appears comfortable.  I explained to them that this is all related to his cancer progression -including the multiple areas of obstruction on CT of his abdomen, worsening pain and dyspnea. There is nothing that can reverse this- to attempt invasive or unsupported tx would prolong his suffering. I reiterated that he is dying and that we are treating him in the only way that we know how-through comfort, sedation, antipyretics, oral care and frequent monitoring for symptoms. His wife expressed understanding of this. I discussed level of care- she wants him to remain monitored in the step-down ICU for now-she is used to being able to see his numbers and says this is very important to her.  Recommendations:  1. DNR, Full comfort 2. Aggressive Symptom Management-pain and sedation management with hydromorphone and lorazepam infusions. 3. Fever- he has severe pain with turning and moving- prefer IV tylenol to suppository 4. Need to continue to work with family on monitoring dependence and have a plan for when the monitor changes and he approaches EOL-may be gradual. 5. Continue to suction secretions, would not escalate O2 level in response to hypoxia and be sure to explain to family this will happen as EOL approaches- as long as he isn't in distress these are just numbers and don't always need an intervention. 6. Agree with leaving him in unit for now since he has pain with movement and his family feels better about his needs being met and monitored.  Will follow closely.  [name], DO Palliative Medicine  Time: 35 min Greater than  50%  of this time was spent counseling and coordinating care related to the above assessment and plan.

## 2018-03-18 NOTE — Care Management Note (Signed)
Case Management Note  Patient Details  Name: Tony [name] MRN: [id number] Date of Birth: 1966-05-01  Subjective/Objective:                  Stage iv lung cancer with palliative chemo, temp 101.3,presented with severe abd pain-[id number] comfort measures-iv dilaudid drip and iv ativan drip  Action/Plan: Will follow for progression of care and clinical status. Will follow for case management needs none present at this time.  Expected Discharge Date:  (unknown)               Expected Discharge Plan:  Home/Self Care  In-House Referral:     Discharge planning Services  CM Consult  Post Acute Care Choice:    Choice offered to:     DME Arranged:    DME Agency:     HH Arranged:    HH Agency:     Status of Service:  In process, will continue to follow  If discussed at Long Length of Stay Meetings, dates discussed:    Additional Comments:  Leeroy Cha, RN 03/18/2018, 9:10 AM

## 2018-03-19 MED ORDER — KETOROLAC TROMETHAMINE 30 MG/ML IJ SOLN
30.0000 mg | INTRAMUSCULAR | Status: DC
  Administered 2018-03-19 – 2018-03-21 (×14): 30 mg via INTRAVENOUS
  Filled 2018-03-19 (×13): qty 1

## 2018-03-19 MED ORDER — KETOROLAC TROMETHAMINE 30 MG/ML IJ SOLN
INTRAMUSCULAR | Status: AC
  Administered 2018-03-19: 30 mg via INTRAVENOUS
  Filled 2018-03-19: qty 1

## 2018-03-19 NOTE — Progress Notes (Signed)
Palliative Care Follow-up Note  Jaekwon continues to decline slowly, sepsis related to small bowel obstruction from metastatic lung cancer, difficult to manage pain related to large tumor burden in his neck and chest wall. His breathing is more irregular this afternoon. His daughter is at bedside, she is very appropriate in her expectations and understand comfort care. Glenard Haring his wife went home to get some rest, her sister in law is in the room during the day and has insisted on treating numbers on the monitor which is concerning. I spoke with Glenard Haring by phone who requested we aggressively treat his fever, she has it in her mind that we are "not going to do anything for him" and that him being in ICU means that he will get better care. She is hoping for a miracle, I reassured her we would not stand in the way of that- but I do believe that he is actively dying and I cannot explain medically why this has not already occurred. I encouraged her to take it one day at a time. I also provided education at bedside that we don't treat numbers on the monitor -we treat the patient-and that they actually have little value to the decisions that we make and how we adjust his medications. He looks very comfortable at this time. His perfusion seems to be good at this point, his chest is very congested, and his respirations are very shallow, his oral mucosa is very dry, he has lost jaw control, predominantly mouth breathing. He does wince when I gently push on his abdomen. His extremities are edematous and swollen. I offered moving him to another room in the hospital to give them more space and comfort, but they at this point do not want him to be moved-I will address again in the AM.  I anticipate a hospital death in the next 24 hours.  Time: 35 min Greater than 50%  of this time was spent counseling and coordinating care related to the above assessment and plan.  [name], DO Palliative Medicine

## 2018-03-19 NOTE — Progress Notes (Signed)
PROGRESS NOTE  Brief Narrative: Tony [name] is a 52 y.o. male with a history of stage IV lung CA s/p XRT recently receiving palliative chemotherapy, recent admission for right chest cellulitis, PVD s/p LE bypass, thrombectomy with dry gangrene to toes of right foot, and DVT, SVC syndrome, and HTN who presented to the ED due to abrupt severe abdominal pain, vomiting found to be septic with lactate 5.91 and evidence of SBO with transition point on CT. General surgery consulted and empiric antibiotics started. He has been persistently tachycardic, hyPERtensive, and developing progressive delirium complicated by tachypnea and hypoxia. The patient is not a surgical candidate and after goals of care discussions with pulmonary/critical care medicine team and palliative care team, the patient's wife decided to pursue comfort measures only on 10/4. Opiate and anxiolytic infusions were started with improvement in agitation/delirium and pain.   Subjective: Requiring boluses for distress. Some disconnect between goals of care with RN and pt's family. Pt's secretions seem to be stable. Consistently saturating in low-mid 80%'s, down to 70's immediately when oxygen turned down.   Objective: BP 108/71 (BP Location: Left Arm)   Pulse (!) 131   Temp (!) 102 F (38.9 C) (Axillary) Comment: RN notified  Resp (!) 21   Ht 6' (1.829 m)   Wt 72.5 kg   SpO2 (!) 83%   BMI 21.68 kg/m   Gen: Ill male in no acute distress Pulm: Nonlabored. Coarse throughout. CV: Regular tachycardia in 120-130's. No murmur, rub, or gallop. No JVD, no dependent edema. GI: Abdomen distended. No grimacing with palpation.  Ext: Cool feet with stable left toes gangrene. Skin: Right neck and chest hyperpigmentation stable. No new rashes, lesions or ulcers on visualized skin.  Neuro: Not meaningfully responsive.  Psych: UTD.  Assessment & Plan: SBO: With acute abdomen. Unable to place NG tube. Not a surgical candidate.  - Comfort  measures started 10/4 in light of no curative treatment options. Appreciate palliative care emotional support and medication titration. On high dose dilaudid and ativan gtt's with intermittent boluses. - Tylenol IV prn fever.  - Pt's wife is adamant that he remain in SDU despite myself and palliative care and nursing staff explaining the rationale that there is no need for continued vital sign monitors or for SDU-level nursing care. I expressed genuine and deep empathy. This is not an easy situation and her anxiety is expressing itself in her holding onto this one element that makes her feel in control. I fear that the distress caused by transfer would be severe. There are currently other beds available in SDU/ICU, discussed at length with unit director. Will not force the issue this morning.  Anxiety, agitation:  - Continue ativan gtt, titrated upwards and stable. Discussed with rn. Continue bolus prn.  SIRS with acute hypoxic respiratory failure:  - Supportive measures as above, continue oxygen for comfort but should not be titrated upwards. Discussing with pt's wife daily the unnecessary nature of intensive monitoring though she strongly refuses to DC monitoring. On opioid for dyspnea.  - Secretions better with robinul and prn suction - DNR confirmed by CCM  DVT: Holding anticoagulation in light of terminal care  PVD s/p aortobifemoral bypass with right toes dry gangrene: Noted  Metastatic adenocarcinoma of the lung s/p XRT: CT scans show evidence of disease progression, under care of palliative care as outpatient. Poor prognosis preceding this acute illness.   Patrecia Pour, MD 03/19/2018, 10:13 AM

## 2018-03-20 ENCOUNTER — Ambulatory Visit: Payer: Self-pay | Admitting: Urology

## 2018-03-20 LAB — CULTURE, BLOOD (ROUTINE X 2): CULTURE: NO GROWTH

## 2018-03-20 NOTE — Progress Notes (Signed)
   03/20/18 1410  Clinical Encounter Type  Visited With Patient and family together  Visit Type Initial  Referral From Palliative care team  Consult/Referral To [place] stopped by nurse's station for update before entering Pt. room.  Two nieces and a sister-n-law were present and resting in the room with the Pt.  Upon entering and introducing myself, the sister-n-law stated we recognize the hospital is doing all it can do, but we are putting it in the hands of the [facility].  The chaplain recognized their faith and offered her personal prayers for the Pt. The niece communicated well with and supported the  chaplain visit.  The niece asked about how to get in touch with the chaplain in the future. The niece asked for the chaplain's name and it was shared. The chaplain shared with the niece, chaplains can be reached through the nurse's station.

## 2018-03-20 NOTE — Progress Notes (Addendum)
Tony [name] is largely unchanged today from yesterday. Has required a few more boluses of pain medication for discomfort. I had additional conversation with his wife Tony [name] about appropriate level of care-she tells me she is at peace with everything and doesn't want him to suffer. They will likely be more comfortable in a larger room upstairs and I reassured them we would continue to provide a very high level of care for him.  Confirmed with his wife it was ok to transfer him.Orders placed in Epic. I reminded them that the monitor wasn't actually helping [place] care for [name] and is not necessary or needed.  [name], DO Palliative Medicine  Time: 35 min Greater than 50%  of this time was spent counseling and coordinating care related to the above assessment and plan.

## 2018-03-20 NOTE — Progress Notes (Signed)
PROGRESS NOTE  Brief Narrative: Tony [name] is a 52 y.o. male with a history of stage IV lung CA s/p XRT recently receiving palliative chemotherapy, recent admission for right chest cellulitis, PVD s/p LE bypass, thrombectomy with dry gangrene to toes of right foot, and DVT, SVC syndrome, and HTN who presented to the ED due to abrupt severe abdominal pain, vomiting found to be septic with lactate 5.91 and evidence of SBO with transition point on CT. General surgery consulted and empiric antibiotics started. He has been persistently tachycardic, hyPERtensive, and developing progressive delirium complicated by tachypnea and hypoxia. The patient is not a surgical candidate and after goals of care discussions with pulmonary/critical care medicine team and palliative care team, the patient's wife decided to pursue comfort measures only on 10/4. Opiate and anxiolytic infusions were started with improvement in agitation/delirium and pain.   Subjective: No events overnight. The patient's wife requested increase in oxygen delivery despite no evidence of respiratory distress.  Objective: BP 115/68   Pulse (!) 121   Temp 98.7 F (37.1 C) (Oral)   Resp 16   Ht 6' (1.829 m)   Wt 72.5 kg   SpO2 (!) 87%   BMI 21.68 kg/m   Gen: Unresponsive 52 y.o. male in no distress Pulm: Disorganized pattern with coarse sounds. CV: Regular rate and rhythm. No murmur, rub, or gallop. No JVD, no dependent edema. GI: A few high pitched tinkling bowel sounds consistent with obstruction, grimaces with palpation of abdomen.  Ext: Warm, no deformities Skin: Right foot with gangrenous toes. Previous day's exam mistakenly identified this as on the left. Stable changes to chest and right neck with lymphadenopathy  Neuro: Unresponsive. Psych: UTD.  Assessment & Plan: SBO: With acute abdomen. Unable to place NG tube. Not a surgical candidate.  - Comfort measures started 10/4 in light of no curative treatment options.  Appreciate palliative care emotional support and medication titration. On high dose dilaudid and ativan gtt's with intermittent boluses. - Tylenol and toradol IV prn fever.  - Pt's wife remains resolute this morning that she does not agree with transfer. As mentioned previously, I hope that further discussions will lead her to understand why transfer is reasonable and necessary. However, I believe there will be significant distress caused by transferring the patient.   Anxiety, agitation:  - Continue ativan gtt with prn's, titrated upwards and stable.  SIRS with acute hypoxic respiratory failure:  - Supportive measures as above, continue oxygen for comfort but should not be titrated upwards. Discussing with pt's wife daily the unnecessary nature of intensive monitoring though she strongly refuses to DC monitoring. On opioid for dyspnea.  - Secretions better with robinul and prn suction - DNR confirmed by CCM  DVT: Holding anticoagulation in light of terminal care  PVD s/p aortobifemoral bypass with right toes dry gangrene: Noted  Metastatic adenocarcinoma of the lung s/p XRT: CT scans show evidence of disease progression, under care of palliative care as outpatient. Poor prognosis preceding this acute illness.   Patrecia Pour, MD 03/20/2018, 2:27 PM

## 2018-03-20 NOTE — Care Management Note (Signed)
Case Management Note  Patient Details  Name: Tony [name] MRN: [id number] Date of Birth: [birth year]  Subjective/Objective:                  continues to decline slowly, sepsis related to small bowel obstruction from metastatic lung cancer, difficult to manage pain related to large tumor burden in his neck and chest wall. His breathing is more irregular this afternoon. His daughter is at bedside, she is very appropriate in her expectations and understand comfort care. Tony [name] his wife went home to get some rest, her sister in law is in the room during the day and has insisted on treating numbers on the monitor which is concerning. I spoke with Tony [name] by phone who requested we aggressively treat his fever, she has it in her mind that we are "not going to do anything for him" and that him being in ICU means that he will get better care. She is hoping for a miracle,   Action/Plan: Following for progression of care. Following for cm needs, none present at this time, no discharge plans at this time.  Expected Discharge Date:  (unknown)               Expected Discharge Plan:  Home/Self Care  In-House Referral:     Discharge planning Services  CM Consult  Post Acute Care Choice:    Choice offered to:     DME Arranged:    DME Agency:     HH Arranged:    HH Agency:     Status of Service:  In process, will continue to follow  If discussed at Long Length of Stay Meetings, dates discussed:    Additional Comments:  Leeroy Cha, RN 03/20/2018, 9:53 AM

## 2018-03-21 ENCOUNTER — Inpatient Hospital Stay: Payer: BLUE CROSS/BLUE SHIELD

## 2018-03-21 ENCOUNTER — Inpatient Hospital Stay: Payer: BLUE CROSS/BLUE SHIELD | Admitting: Hematology

## 2018-03-21 MED ORDER — GLYCOPYRROLATE 0.2 MG/ML IJ SOLN: 0.2000 mg | INTRAMUSCULAR | Status: DC

## 2018-03-21 MED ORDER — ACETAMINOPHEN 650 MG RE SUPP: 650.0000 mg | RECTAL | Status: DC | PRN

## 2018-03-23 ENCOUNTER — Inpatient Hospital Stay: Payer: BLUE CROSS/BLUE SHIELD

## 2018-03-27 ENCOUNTER — Ambulatory Visit: Payer: Self-pay | Admitting: Hematology

## 2018-03-27 ENCOUNTER — Other Ambulatory Visit: Payer: Self-pay

## 2018-03-27 ENCOUNTER — Ambulatory Visit: Payer: BLUE CROSS/BLUE SHIELD

## 2018-03-28 ENCOUNTER — Ambulatory Visit: Payer: Self-pay | Admitting: Hematology

## 2018-03-28 ENCOUNTER — Encounter: Payer: Self-pay | Admitting: Nutrition

## 2018-03-28 ENCOUNTER — Other Ambulatory Visit: Payer: Self-pay

## 2018-03-28 ENCOUNTER — Ambulatory Visit: Payer: Self-pay

## 2018-04-11 ENCOUNTER — Ambulatory Visit: Payer: Self-pay | Admitting: Hematology

## 2018-04-11 ENCOUNTER — Other Ambulatory Visit: Payer: Self-pay

## 2018-04-11 ENCOUNTER — Ambulatory Visit: Payer: Self-pay

## 2018-04-12 NOTE — Progress Notes (Signed)
Pt expired. RN and Doctor, general practice both verified expiration. MD notified.

## 2018-04-12 NOTE — Progress Notes (Signed)
8:18 Rn reports pt needs Robinol dosage increased. She reports pt very congested.  9:00 RN x 2 confirmed time of death as 8:33XO.  Death certificate completed.

## 2018-04-12 NOTE — Progress Notes (Signed)
At 1745 Dr Hilma Favors gave a verbal order to writer to give dilaudid 2 mg bolus. Order was verbal, Probation officer tried to place order at 2010. This order was not allow.  Because of the late time frame

## 2018-04-12 NOTE — Progress Notes (Signed)
2330 RN wasted 62ml of ativan and 164ml of dilaudid in narcotic waste device with taylor russell, rn  Nedra Hai, RN  2018-03-22

## 2018-04-12 NOTE — Progress Notes (Signed)
[facility]:  Pt is an active pt with [facility] a home based [facility] Care program provided by [facility]. He was routinely getting nursing and SW visits at home prior to hospitalization. Please call with any questions regarding his care if information is needed.  Plan of care placed on chart for hospital records.  [facility] Liaison [phone]

## 2018-04-12 NOTE — Progress Notes (Signed)
Wasted 15 ml of Ativan and 29 ml of dilaudid at [facility] in narcotic waste system with Aloha Gell, RN.  Nedra Hai, RN 04-15-2018

## 2018-04-12 NOTE — Progress Notes (Signed)
PROGRESS NOTE  Brief Narrative: Tony [name] is a 52 y.o. male with a history of stage IV lung CA s/p XRT recently receiving palliative chemotherapy, recent admission for right chest cellulitis, PVD s/p LE bypass, thrombectomy with dry gangrene to toes of right foot, and DVT, SVC syndrome, and HTN who presented to the ED due to abrupt severe abdominal pain, vomiting found to be septic with lactate 5.91 and evidence of SBO with transition point on CT. General surgery consulted and empiric antibiotics started. He has been persistently tachycardic, hyPERtensive, and developing progressive delirium complicated by tachypnea and hypoxia. The patient is not a surgical candidate and after goals of care discussions with pulmonary/critical care medicine team and palliative care team, the patient's wife decided to pursue comfort measures only on 10/4. Opiate and anxiolytic infusions were started with improvement in agitation/delirium and pain. The patient is now completely comfort care and a hospital death is anticipated.   Subjective: Transfer to floor 10/9 did not have any complications. Wife satisfied with room and staff. Ok with no intensive monitoring. Feels patient is calm. Pt unresponsive, but does not appear to be in pain.  Objective: BP 138/85 (BP Location: Left Arm)   Pulse (!) 151 Comment: rn was notified  Temp 98.5 F (36.9 C) (Oral)   Resp (!) 40 Comment: rn was notified  Ht 6' (1.829 m)   Wt 72.5 kg   SpO2 92%   BMI 21.68 kg/m   Gen: Unresponsive male  Pulm: Tachypneic with coarse breath sounds, secretions improved.  CV: Regular tachycardia, no new rub or gallop. No JVD, +diffuse edema.  GI: Abdomen mildly distended with no bowel sounds heard upon auscultation for 2 minutes.  Ext: Right toes gangrene is dry and unchanged. Skin: Right neck/chest hyperpigmented inflammation unchanged. No new rashes, lesions or ulcers on visualized skin.  Neuro: Not meaningfully responsive Psych:  UTD  Assessment & Plan: SBO: With acute abdomen. Unable to place NG tube. Not a surgical candidate.  - Comfort measures started 10/4 in light of no curative treatment options. Continues on high dose of dilaudid gtt and tylenol/toradol prn intermittent fevers.  - Palliative care assistance has been invaluable in this patient's care and is greatly appreciated.  - Continue to holistically support the family. Chaplain visit was appreciated.   Anxiety, agitation:  - Continue ativan gtt with prn's, titrated upwards and stable.  SIRS with acute hypoxic respiratory failure:  - Supportive measures as above. - Secretions better with robinul and prn suction - DNR  DVT: Holding anticoagulation in light of terminal care  PVD s/p aortobifemoral bypass with right toes dry gangrene: Noted  Metastatic adenocarcinoma of the lung s/p XRT: CT scans show evidence of disease progression, under care of palliative care as outpatient. Poor prognosis preceding this acute illness.   Patrecia Pour, MD 04/13/18, 3:26 PM

## 2018-04-12 NOTE — Death Summary Note (Addendum)
DEATH SUMMARY   Patient Details  Name: Tony [name] MRN: [id number] DOB: [birth year]  Admission/Discharge Information   Admit Date:  03/15/18  Date of Death: Date of Death: 2018-03-22  Time of Death: Time of Death: 08/21/2098  Length of Stay: 08/18/2022  Referring Physician: [name], MD   Reason(s) for Hospitalization  Small bowel obstruction, severe sepsis with end organ dysfunction  Diagnoses  Preliminary cause of death: Small bowel obstruction Secondary Diagnoses (including complications and co-morbidities):  Principal Problem:   SBO (small bowel obstruction) ([place]) Active Problems:   Severe sepsis with end organ dysfunction   SMOKER   Depression with anxiety   Essential hypertension   PVD (peripheral vascular disease) ([place])   Pain of right lower extremity due to ischemia   S/P aortobifemoral bypass surgery   Cervical lymphadenopathy   DVT (deep venous thrombosis) ([facility])   Malignant neoplasm of upper lobe of right lung ([facility])   SVC syndrome   Malnutrition of moderate degree   Mediastinal adenopathy with near SVC syndrome   On antineoplastic chemotherapy   Cancer-related pain   Ascites   Protein-calorie malnutrition, moderate ([facility])   Hyperglycemia   Metastatic lung cancer to axillary lymph nodes    Chronic anticoagulation (Fondaparinux)   Encounter for palliative care   Goals of care, counseling/discussion   Brief Hospital Course (including significant findings, care, treatment, and services provided and events leading to death)  Tony [name] is a 52 y.o. male with a history of stage IV lung CA s/p XRT recently receiving palliative chemotherapy, recent admission for right chest cellulitis, PVD s/p LE bypass, thrombectomy with dry gangrene to toes of right foot, and DVT, SVC syndrome, and HTN who presented to the ED due to abrupt severe abdominal pain, vomiting found to be septic with lactate 5.91 and evidence of SBO with transition point on CT. General surgery consulted  and empiric antibiotics started. He has been persistently tachycardic, hyPERtensive, and developing progressive delirium complicated by tachypnea and hypoxia. The patient is not a surgical candidate and after goals of care discussions with pulmonary/critical care medicine team and palliative care team, the patient's wife decided to pursue comfort measures only on 10/4. Opiate and anxiolytic infusions were started with improvement in agitation/delirium and pain. The patient was completely under comfort care and died 10/10 with family at bedside.  Pertinent Labs and Studies  Significant Diagnostic Studies Ct Angio Chest Pe W And/or Wo Contrast  Result Date: 02/25/2018 CLINICAL DATA:  Productive cough. Metastatic cancer with involvement of the right upper lobe EXAM: CT ANGIOGRAPHY CHEST WITH CONTRAST TECHNIQUE: Multidetector CT imaging of the chest was performed using the standard protocol during bolus administration of intravenous contrast. Multiplanar CT image reconstructions and MIPs were obtained to evaluate the vascular anatomy. CONTRAST:  176mL ISOVUE-370 IOPAMIDOL (ISOVUE-370) INJECTION 76% COMPARISON:  Multiple exams, including 01/07/2018 FINDINGS: Despite efforts by the technologist and patient, motion artifact is present on today's exam and could not be eliminated. This reduces exam sensitivity and specificity. Cardiovascular: No filling defect is identified in the pulmonary arterial tree to suggest pulmonary embolus. There is narrowing of the right upper lobe pulmonary artery due to extrinsic mass effect from tumor. Difficult to be certain that the tumor is not invading the right upper lobe pulmonary artery given the complete 360 degree engulfing of the vessel by the tumor, but I do not see obvious filling defect within the narrowed segment of pulmonary artery traversing the tumor. The contour is also fairly similar to 01/07/18.  Mediastinum/Nodes: Bulky right paratracheal tumor as before, measuring up  to 4.6 cm in short axis, formerly 4.8 cm. A prevascular node measures 1.6 cm in short axis on image 44/6, previously 2.1 cm. Right hilar node 1.9 cm in short axis on image 50/6, formerly 2.0 cm. Conglomerate right supraclavicular adenopathy difficult to separate from the sternocleidomastoid muscle, less sharply defined but mildly reduced in overall size compared to prior exam. A left supraclavicular node measures 2.2 cm in short axis on image 9/6, formerly the same. Bilateral pathologic axillary adenopathy. An index left axillary node measures 2.5 cm in short axis on image 31/6, significantly worsened, previously 0.9 cm. Right axillary and subpectoral adenopathy to prior. Lungs/Pleura: Paraseptal emphysema. The right upper lobe lung nodule measures up to 0.8 cm in short axis on image 43/12, previously 1.3 cm by my measurements. New ground-glass density band in the right upper lobe for example on image 55/12, likely from alveolitis, less likely a new focus of low-grade adenocarcinoma. Small amount of frothy fluid in the trachea and right mainstem bronchus. Upper Abdomen: Unremarkable Musculoskeletal: Unremarkable Review of the MIP images confirms the above findings. IMPRESSION: 1. No filling defect is identified in the pulmonary arterial tree to suggest pulmonary embolus. Stable degree of mass effect on the right upper lobe pulmonary artery by the right mediastinal tumor. 2. Mixed appearance of the tumor burden, on balance the tumor burden is mildly reduced. Specifically, the bulky mediastinal tumor is mildly reduced in size; the right upper lobe bandlike pulmonary nodule is reduced in size; and mediastinal and right supraclavicular adenopathy is mildly reduced. However, left axillary adenopathy is significantly increased. 3. Other imaging findings of potential clinical significance: Emphysema (ICD10-J43.9). Mild focus of alveolitis in the right upper lobe. Electronically Signed   By: Van Clines M.D.   On:  02/25/2018 18:21   Ct Abdomen Pelvis W Contrast  Addendum Date: 03/19/2018   ADDENDUM REPORT: 03/24/2018 22:46 ADDENDUM: Acute findings discussed with and reconfirmed by Dr.DAN FLOYD on 04/11/2018 at 10:46 pm. Electronically Signed   By: Elon Alas M.D.   On: 04/02/2018 22:46   Result Date: 03/13/2018 CLINICAL DATA:  Abdominal pain. History of stage IV lung cancer, aortofemoral bypass. EXAM: CT ABDOMEN AND PELVIS WITH CONTRAST TECHNIQUE: Multidetector CT imaging of the abdomen and pelvis was performed using the standard protocol following bolus administration of intravenous contrast. CONTRAST:  155mL ISOVUE-300 IOPAMIDOL (ISOVUE-300) INJECTION 61% COMPARISON:  CT abdomen and pelvis December 24, 2017 FINDINGS: LOWER CHEST: Resolution of pleural effusions. Included heart size is normal. Bulky LEFT chest wall adenopathy new from prior CT. HEPATOBILIARY: Liver and gallbladder are normal. PANCREAS: Normal. SPLEEN: Normal. ADRENALS/URINARY TRACT: Kidneys are orthotopic, demonstrating symmetric enhancement. No nephrolithiasis, hydronephrosis or solid renal masses. The unopacified ureters are normal in course and caliber. Delayed imaging through the kidneys demonstrates symmetric prompt contrast excretion within the proximal urinary collecting system. Urinary bladder is partially distended and unremarkable. Stable nonspecific 12 mm LEFT adrenal nodule. STOMACH/BOWEL: Small bowel dilated to 3.2 cm with multiple loose edematous bowel. Multiple small bowel transition points all localized within the LEFT central mid abdomen. No pneumatosis. Mild amount of retained large bowel stool. Normal appendix. VASCULAR/LYMPHATIC: Aortofemoral bypass. Patent proximal SMA and celiac axis, limited assessment by non angiographic phase. Patent bypass though not tailored for evaluation. No lymphadenopathy by CT size criteria. REPRODUCTIVE: Normal. OTHER: Moderate amount of low-density ascites. No intraperitoneal free. No central  mesenteric mass. MUSCULOSKELETAL: Nonacute. Surgical clips bilateral inguinal soft tissues consistent with prior vascular  access. Anterior abdominal subcutaneous emphysema, possibly from injection sites. IMPRESSION: 1. Small-bowel obstruction with multiple central transition points, this can be seen with adhesions or internal hernia. Edematous small bowel without pneumatosis. 2. Moderate ascites. 3. New bulky LEFT chest wall lymphadenopathy consistent with worsening metastatic disease. 4. Status post aorta by femoral bypass, patent vessels by non angiographic CT. Aortic Atherosclerosis (ICD10-I70.0). Electronically Signed: By: Elon Alas M.D. On: 03/27/2018 22:42   Dg Chest Port 1 View  Result Date: 03/31/2018 CLINICAL DATA:  Sepsis, bulky adenopathy, suspect metastatic lung cancer EXAM: PORTABLE CHEST 1 VIEW COMPARISON:  02/25/2018, 01/07/2018 FINDINGS: Bulky right paratracheal adenopathy again noted. Normal heart size. Lower lung volumes with increased basilar atelectasis. No large effusion or pneumothorax. Soft tissue prominence in the clavicular regions compatible with known adenopathy. No acute osseous finding. IMPRESSION: Lower lung volumes with mild vascular congestion and basilar atelectasis. Grossly stable adenopathy. Electronically Signed   By: Jerilynn Mages.  Shick M.D.   On: 04/08/2018 21:44   Dg Foot Complete Right  Result Date: 02/25/2018 CLINICAL DATA:  Necrosis of the great toe, second toe and third toe. EXAM: RIGHT FOOT COMPLETE - 3+ VIEW COMPARISON:  None. FINDINGS: Pronounced deficiency of the soft tissue of the distal toes, most notable at the second and third toes. No plain radiographic evidence of osteomyelitis. No recent fracture. Old healed fracture of the proximal phalanx of the fifth toe. IMPRESSION: Pronounced soft tissue deficiency of the distal toes. No plain radiographic evidence of osteomyelitis. Electronically Signed   By: Nelson Chimes M.D.   On: 02/25/2018 16:56     Microbiology No results found for this or any previous visit (from the past 240 hour(s)).  Lab Basic Metabolic Panel: No results for input(s): NA, K, CL, CO2, GLUCOSE, BUN, CREATININE, CALCIUM, MG, PHOS in the last 168 hours. Liver Function Tests: No results for input(s): AST, ALT, ALKPHOS, BILITOT, PROT, ALBUMIN in the last 168 hours. No results for input(s): LIPASE, AMYLASE in the last 168 hours. No results for input(s): AMMONIA in the last 168 hours. CBC: No results for input(s): WBC, NEUTROABS, HGB, HCT, MCV, PLT in the last 168 hours. Cardiac Enzymes: No results for input(s): CKTOTAL, CKMB, CKMBINDEX, TROPONINI in the last 168 hours. Sepsis Labs: No results for input(s): PROCALCITON, WBC, LATICACIDVEN in the last 168 hours.  Procedures/Operations  None   Patrecia Pour, MD 03/27/2018, 2:20 PM

## 2018-04-12 DEATH — deceased

## 2018-04-13 ENCOUNTER — Ambulatory Visit: Payer: Self-pay

## 2018-05-01 ENCOUNTER — Other Ambulatory Visit (HOSPITAL_COMMUNITY): Payer: Self-pay

## 2018-05-01 ENCOUNTER — Ambulatory Visit: Payer: Self-pay | Admitting: Vascular Surgery

## 2018-05-01 ENCOUNTER — Encounter (HOSPITAL_COMMUNITY): Payer: Self-pay

## 2018-05-02 ENCOUNTER — Other Ambulatory Visit: Payer: Self-pay

## 2018-05-02 ENCOUNTER — Ambulatory Visit: Payer: Self-pay

## 2018-05-02 ENCOUNTER — Ambulatory Visit: Payer: Self-pay | Admitting: Hematology

## 2018-05-04 ENCOUNTER — Ambulatory Visit: Payer: Self-pay

## 2019-06-10 IMAGING — CR DG CHEST 2V
2 series · 2 of 2 positions shown · non-contrast
Comparison: 01/07/2018 chest CT

CLINICAL DATA: Lung cancer, former smoker with dyspnea and chills
today.

EXAM:
CHEST - 2 VIEW

[w chest lat]
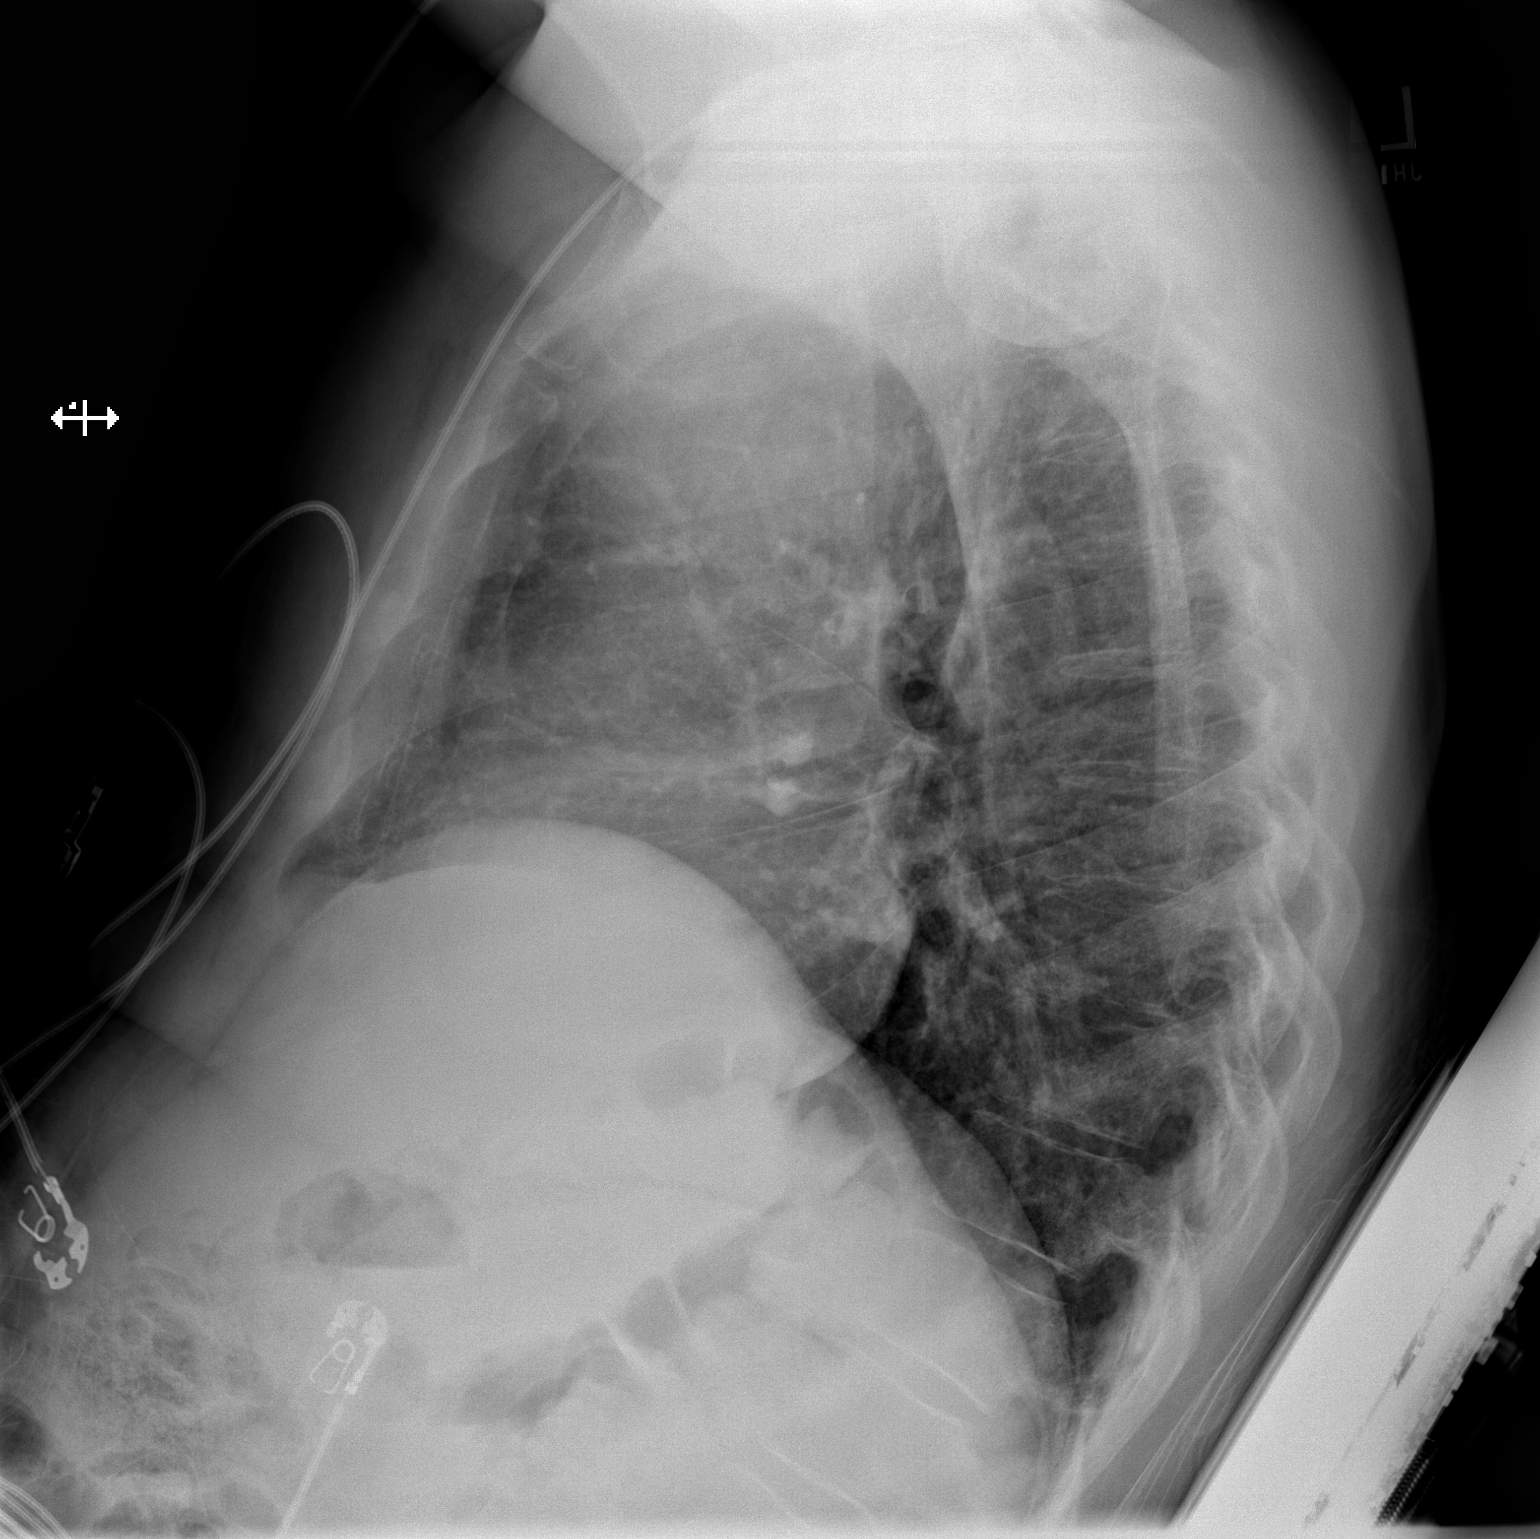

[x chest ap]
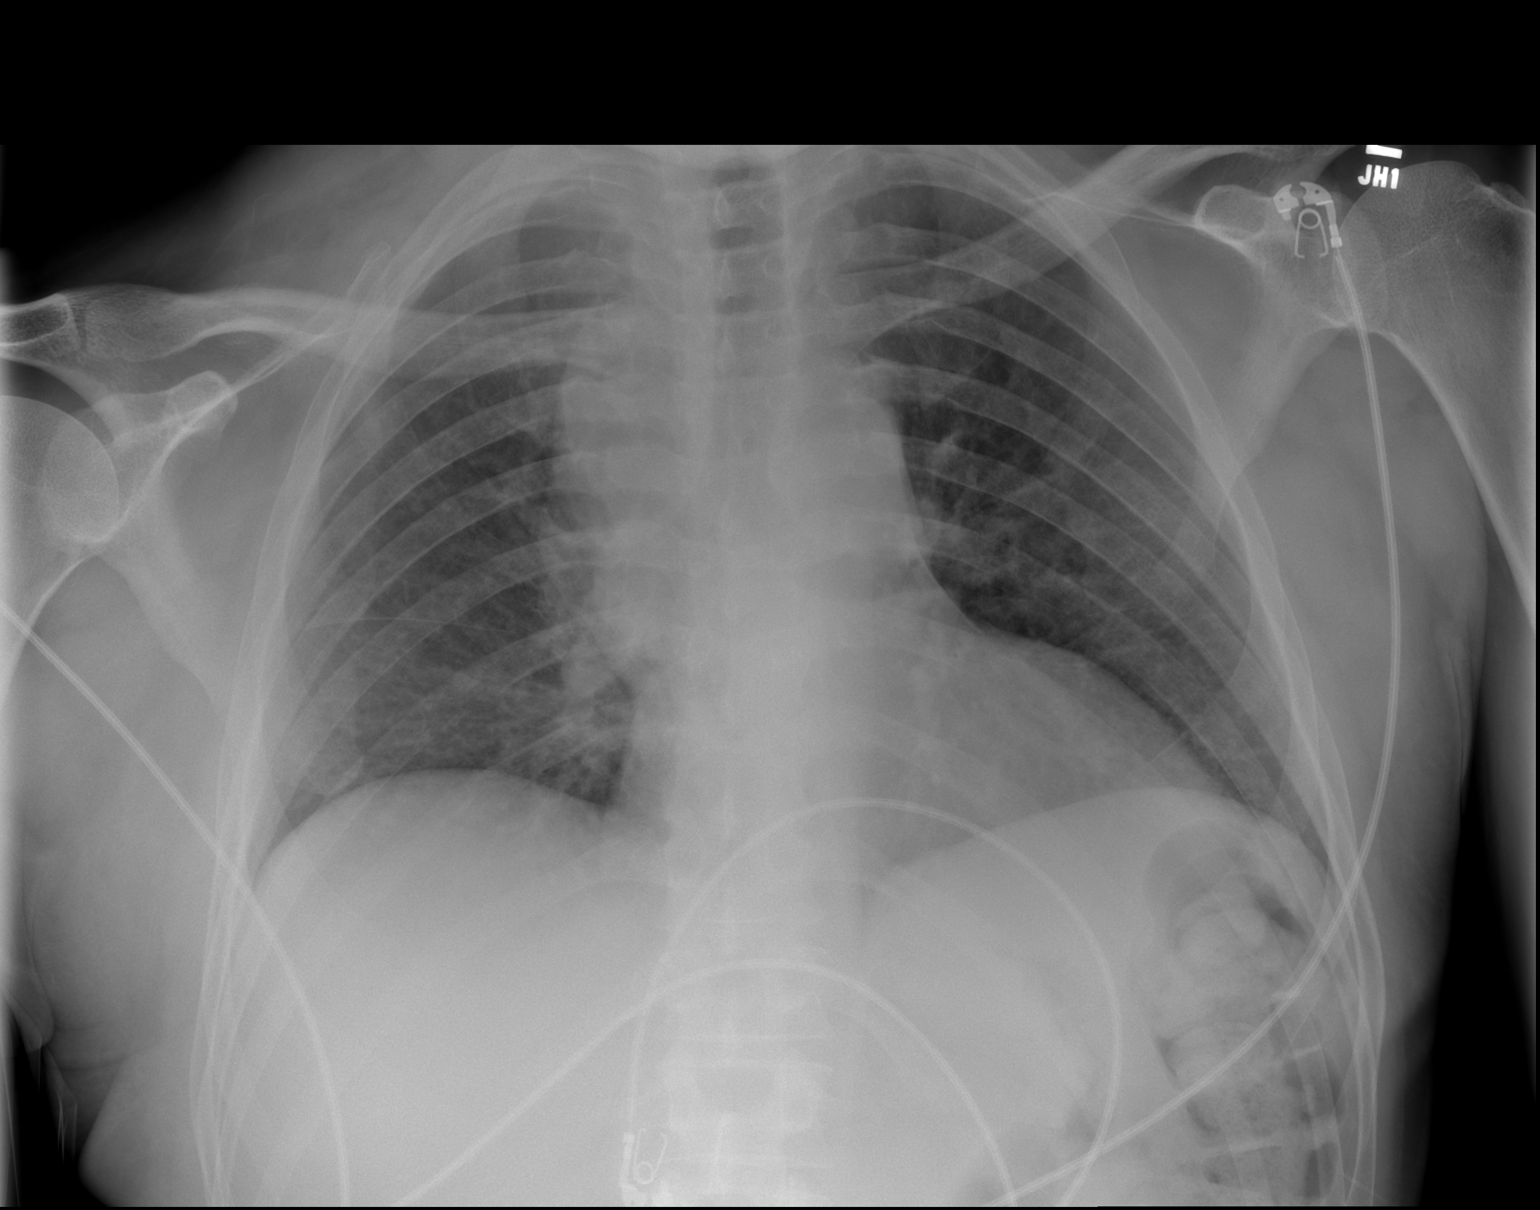

[2 of 2 positions shown; findings below may reference images not displayed]

FINDINGS: Redemonstration of right paratracheal soft tissue prominence
compatible with known mediastinal lymphadenopathy. Midline trachea.
No pulmonary consolidation. Lung volumes are slightly low with
crowding of interstitial lung markings. No aggressive osseous
lesions. Heart is top-normal in size. Nonaneurysmal thoracic aorta.
IMPRESSION: 1. Low lung volumes with crowding of interstitial lung markings. No
alveolar consolidations.
2. Known right paratracheal soft tissue prominence consistent with
lymphadenopathy. No significant change.

## 2019-06-19 IMAGING — CT CT ANGIO CHEST
2 of 6 series · 17 of 46 positions shown · IV contrast (iopamidol)
Comparison: Multiple exams, including 01/07/2018

CLINICAL DATA: Productive cough. Metastatic cancer with involvement
of the right upper lobe

EXAM:
CT ANGIOGRAPHY CHEST WITH CONTRAST
TECHNIQUE: Multidetector CT imaging of the chest was performed using the
standard protocol during bolus administration of intravenous
contrast. Multiplanar CT image reconstructions and MIPs were
obtained to evaluate the vascular anatomy.
CONTRAST:  100mL [id number] IOPAMIDOL ([id number]) INJECTION 76%

[Series 7: thins · axial · 0.71mm/px · z∈[-444,-142]mm · 14 of 332 slices shown]
[im 15/332  lung]
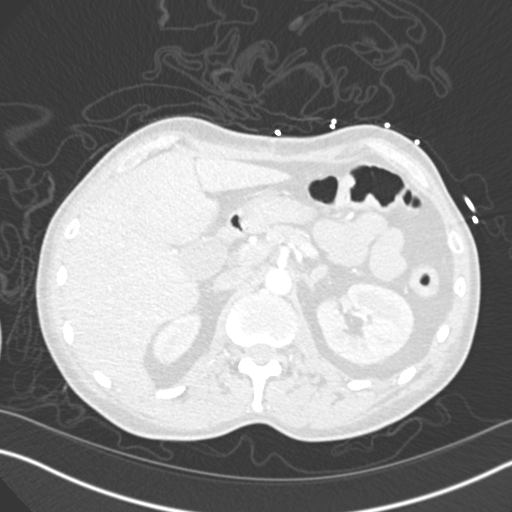
[im 44/332  soft-tissue]
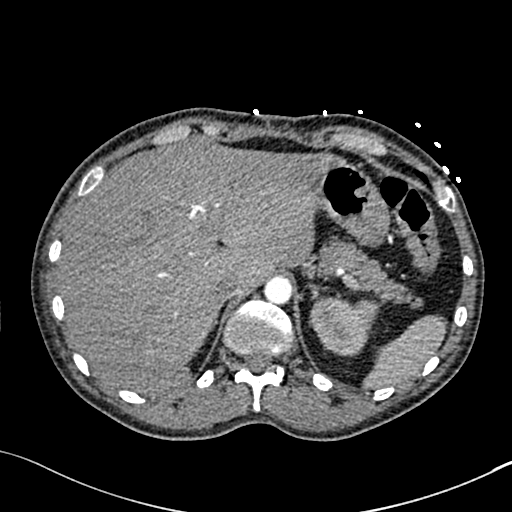
[im 58/332  lung]
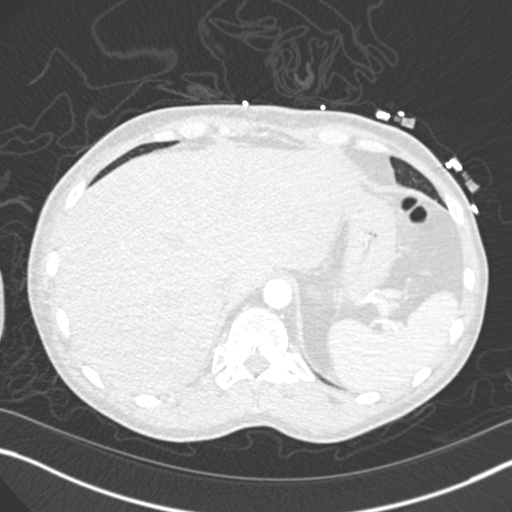
[im 87/332  soft-tissue]
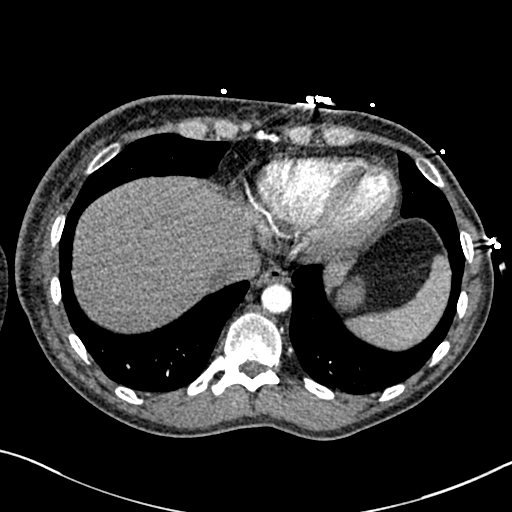
[im 116/332  lung]
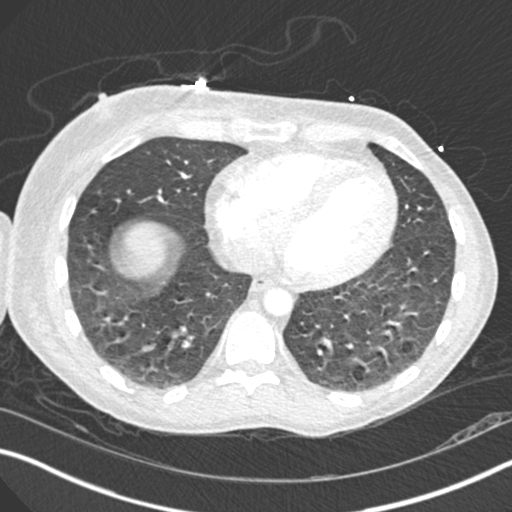
[im 130/332  soft-tissue]
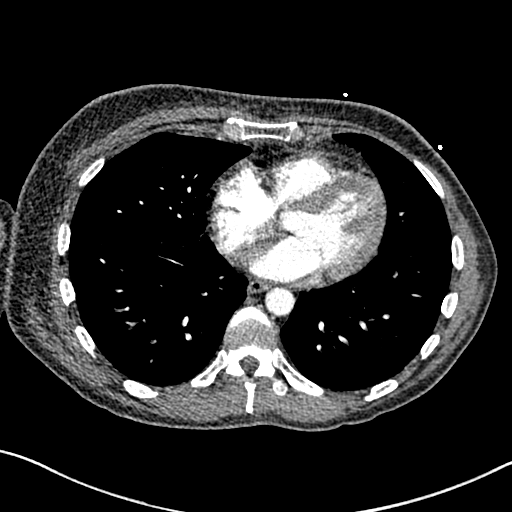
[im 159/332  lung]
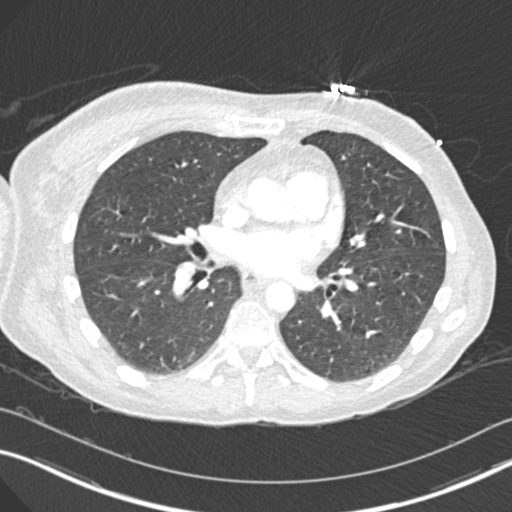
[im 173/332  soft-tissue]
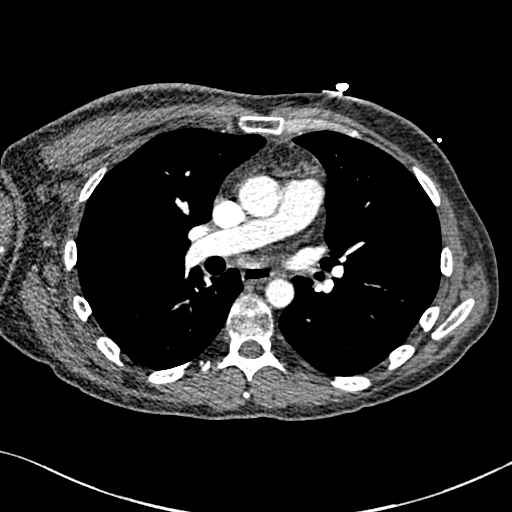
[im 202/332  lung]
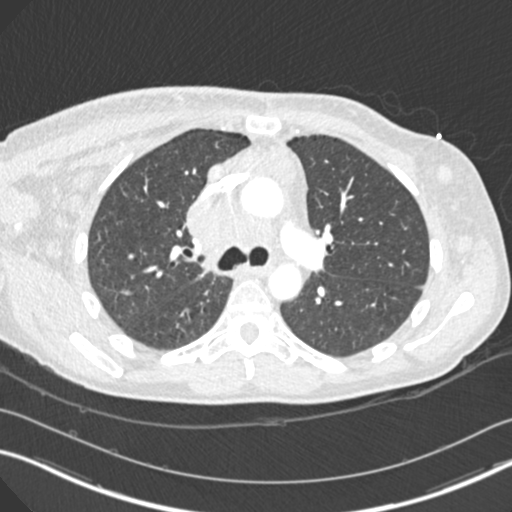
[im 216/332  soft-tissue]
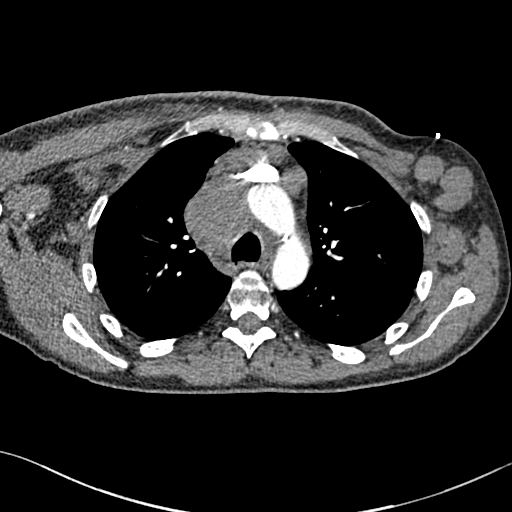
[im 245/332  lung]
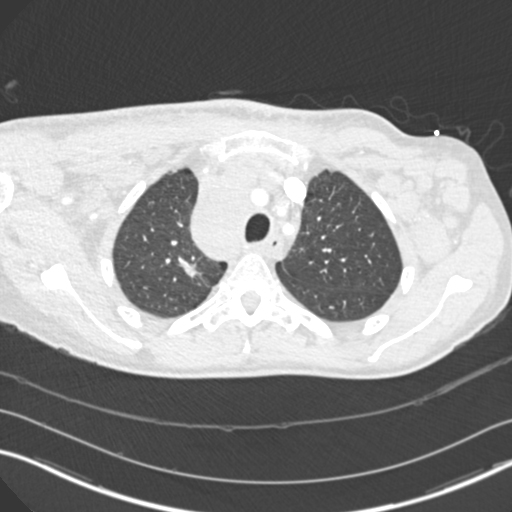
[im 274/332  soft-tissue]
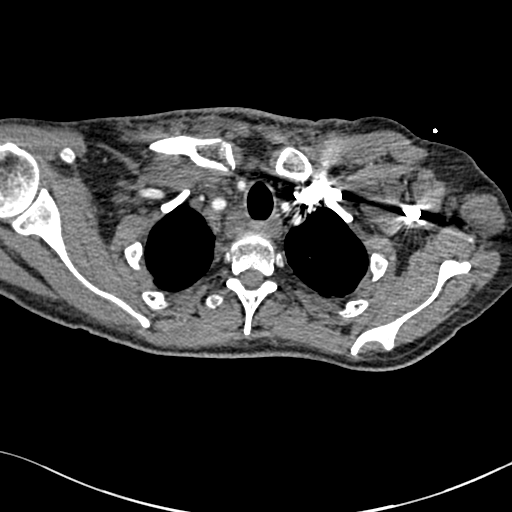
[im 288/332  lung]
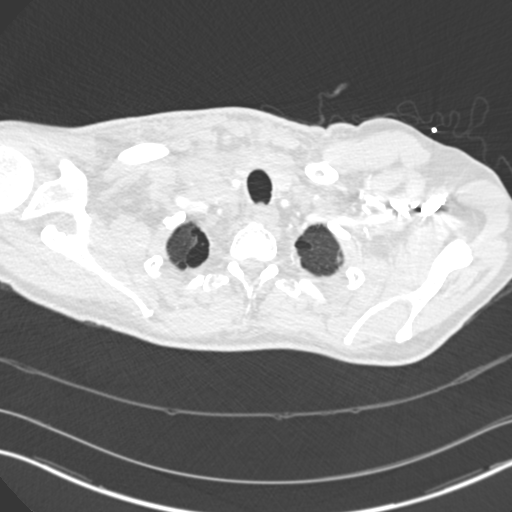
[im 317/332  soft-tissue]
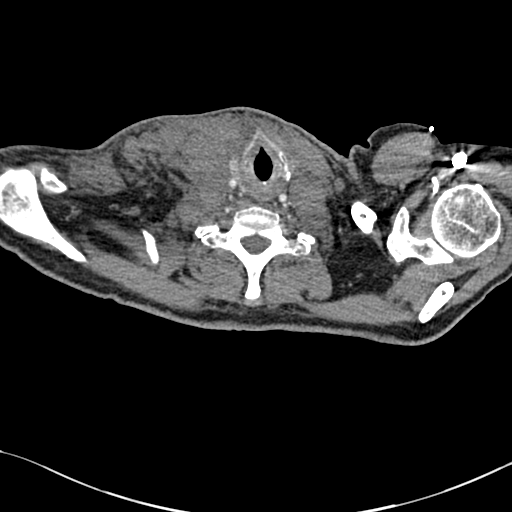

[Series 8: coronal mpr · coronal · 0.63mm/px · 3 of 113 slices shown]
[im 29/113  soft-tissue]
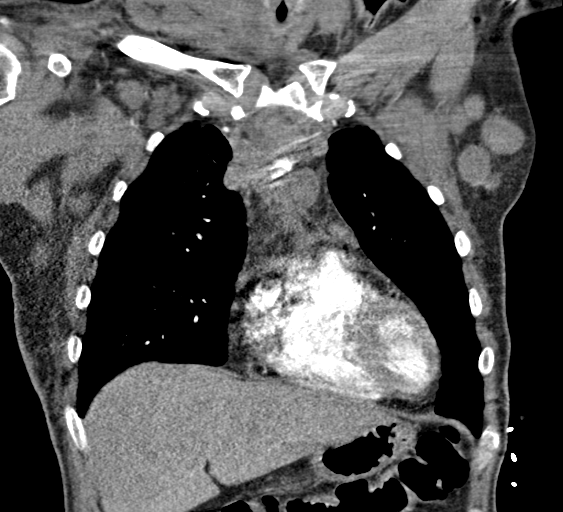
[im 57/113  soft-tissue]
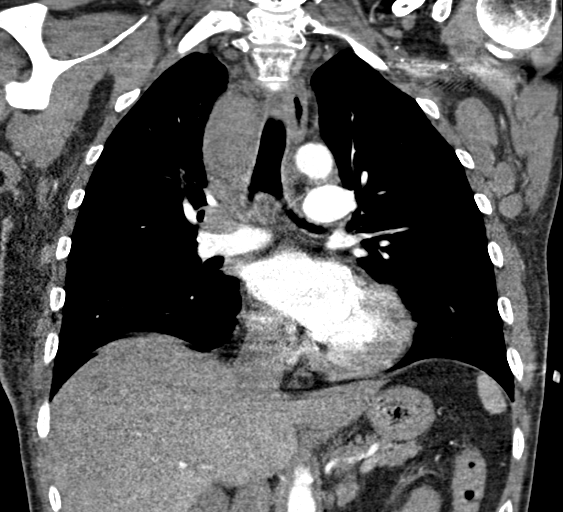
[im 85/113  soft-tissue]
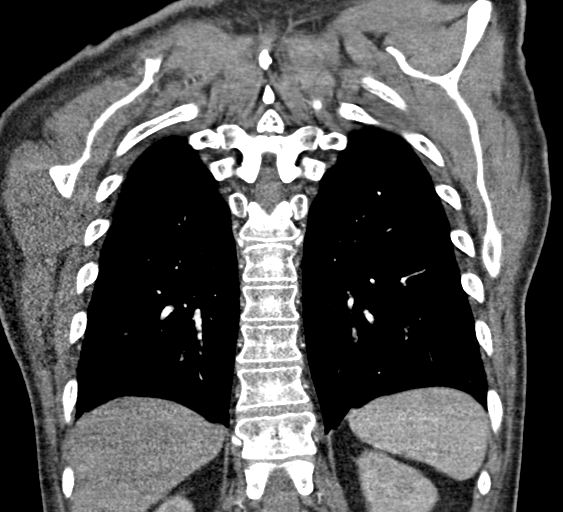

[17 of 46 positions shown; findings below may reference images not displayed]

FINDINGS: Despite efforts by the technologist and patient, motion artifact is
present on today's exam and could not be eliminated. This reduces
exam sensitivity and specificity.

Cardiovascular: No filling defect is identified in the pulmonary
arterial tree to suggest pulmonary embolus. There is narrowing of
the right upper lobe pulmonary artery due to extrinsic mass effect
from tumor. Difficult to be certain that the tumor is not invading
the right upper lobe pulmonary artery given the complete 360 degree
engulfing of the vessel by the tumor, but I do not see obvious
filling defect within the narrowed segment of pulmonary artery
traversing the tumor. The contour is also fairly similar to 01/07/18.

Mediastinum/Nodes: Bulky right paratracheal tumor as before,
measuring up to 4.6 cm in short axis, formerly 4.8 cm. A prevascular
node measures 1.6 cm in short axis on image 44/6, previously 2.1 cm.
Right hilar node 1.9 cm in short axis on image 50/6, formerly
cm.

Conglomerate right supraclavicular adenopathy difficult to separate
from the sternocleidomastoid muscle, less sharply defined but mildly
reduced in overall size compared to prior exam. A left
supraclavicular node measures 2.2 cm in short axis on image [DATE],
formerly the same.

Bilateral pathologic axillary adenopathy. An index left axillary
node measures 2.5 cm in short axis on image [DATE], significantly
worsened, previously 0.9 cm. Right axillary and subpectoral
adenopathy to prior.

Lungs/Pleura: Paraseptal emphysema. The right upper lobe lung nodule
measures up to 0.8 cm in short axis on image 43/12, previously
cm by my measurements. New ground-glass density band in the right
upper lobe for example on image 55/12, likely from alveolitis, less
likely a new focus of low-grade adenocarcinoma.

Small amount of frothy fluid in the trachea and right mainstem
bronchus.

Upper Abdomen: Unremarkable

Musculoskeletal: Unremarkable

Review of the MIP images confirms the above findings.
IMPRESSION: 1. No filling defect is identified in the pulmonary arterial tree to
suggest pulmonary embolus. Stable degree of mass effect on the right
upper lobe pulmonary artery by the right mediastinal tumor.
2. Mixed appearance of the tumor burden, on balance the tumor burden
is mildly reduced. Specifically, the bulky mediastinal tumor is
mildly reduced in size; the right upper lobe bandlike pulmonary
nodule is reduced in size; and mediastinal and right supraclavicular
adenopathy is mildly reduced. However, left axillary adenopathy is
significantly increased.
3. Other imaging findings of potential clinical significance:
Emphysema ([id number]). Mild focus of alveolitis in the right upper
lobe.
